# Patient Record
Sex: Female | Born: 1945
Health system: Southern US, Community
[De-identification: ages and names within clinical notes are randomized; demographics above are authoritative.]

## PROBLEM LIST (undated history)

## (undated) DIAGNOSIS — E119 Type 2 diabetes mellitus without complications: Secondary | ICD-10-CM

## (undated) DIAGNOSIS — N814 Uterovaginal prolapse, unspecified: Secondary | ICD-10-CM

## (undated) DIAGNOSIS — K449 Diaphragmatic hernia without obstruction or gangrene: Secondary | ICD-10-CM

## (undated) DIAGNOSIS — F419 Anxiety disorder, unspecified: Secondary | ICD-10-CM

## (undated) DIAGNOSIS — N816 Rectocele: Secondary | ICD-10-CM

## (undated) DIAGNOSIS — J9801 Acute bronchospasm: Secondary | ICD-10-CM

## (undated) DIAGNOSIS — F32A Depression, unspecified: Secondary | ICD-10-CM

## (undated) DIAGNOSIS — Z9889 Other specified postprocedural states: Secondary | ICD-10-CM

## (undated) DIAGNOSIS — I10 Essential (primary) hypertension: Secondary | ICD-10-CM

## (undated) DIAGNOSIS — K219 Gastro-esophageal reflux disease without esophagitis: Secondary | ICD-10-CM

## (undated) DIAGNOSIS — K589 Irritable bowel syndrome without diarrhea: Secondary | ICD-10-CM

## (undated) DIAGNOSIS — Z8719 Personal history of other diseases of the digestive system: Secondary | ICD-10-CM

## (undated) DIAGNOSIS — R7302 Impaired glucose tolerance (oral): Secondary | ICD-10-CM

## (undated) DIAGNOSIS — K2970 Gastritis, unspecified, without bleeding: Secondary | ICD-10-CM

## (undated) DIAGNOSIS — D649 Anemia, unspecified: Secondary | ICD-10-CM

## (undated) DIAGNOSIS — Z950 Presence of cardiac pacemaker: Secondary | ICD-10-CM

## (undated) DIAGNOSIS — T8859XA Other complications of anesthesia, initial encounter: Secondary | ICD-10-CM

## (undated) DIAGNOSIS — Z8619 Personal history of other infectious and parasitic diseases: Secondary | ICD-10-CM

## (undated) DIAGNOSIS — R739 Hyperglycemia, unspecified: Secondary | ICD-10-CM

## (undated) DIAGNOSIS — I639 Cerebral infarction, unspecified: Secondary | ICD-10-CM

## (undated) DIAGNOSIS — Z8489 Family history of other specified conditions: Secondary | ICD-10-CM

## (undated) DIAGNOSIS — K222 Esophageal obstruction: Secondary | ICD-10-CM

## (undated) DIAGNOSIS — K429 Umbilical hernia without obstruction or gangrene: Secondary | ICD-10-CM

## (undated) DIAGNOSIS — E039 Hypothyroidism, unspecified: Secondary | ICD-10-CM

## (undated) DIAGNOSIS — K5790 Diverticulosis of intestine, part unspecified, without perforation or abscess without bleeding: Secondary | ICD-10-CM

## (undated) DIAGNOSIS — T4145XA Adverse effect of unspecified anesthetic, initial encounter: Secondary | ICD-10-CM

## (undated) DIAGNOSIS — G709 Myoneural disorder, unspecified: Secondary | ICD-10-CM

## (undated) DIAGNOSIS — M069 Rheumatoid arthritis, unspecified: Secondary | ICD-10-CM

## (undated) DIAGNOSIS — L409 Psoriasis, unspecified: Secondary | ICD-10-CM

## (undated) DIAGNOSIS — N8110 Cystocele, unspecified: Secondary | ICD-10-CM

## (undated) DIAGNOSIS — I480 Paroxysmal atrial fibrillation: Secondary | ICD-10-CM

## (undated) DIAGNOSIS — I495 Sick sinus syndrome: Secondary | ICD-10-CM

## (undated) DIAGNOSIS — Z90710 Acquired absence of both cervix and uterus: Secondary | ICD-10-CM

## (undated) DIAGNOSIS — R569 Unspecified convulsions: Secondary | ICD-10-CM

## (undated) DIAGNOSIS — N189 Chronic kidney disease, unspecified: Secondary | ICD-10-CM

## (undated) HISTORY — DX: Paroxysmal atrial fibrillation: I48.0

## (undated) HISTORY — DX: Essential (primary) hypertension: I10

## (undated) HISTORY — DX: Personal history of other infectious and parasitic diseases: Z86.19

## (undated) HISTORY — DX: Type 2 diabetes mellitus without complications: E11.9

## (undated) HISTORY — DX: Psoriasis, unspecified: L40.9

## (undated) HISTORY — DX: Chronic kidney disease, unspecified: N18.9

## (undated) HISTORY — PX: RECTOCELE REPAIR: SHX761

## (undated) HISTORY — DX: Anxiety disorder, unspecified: F41.9

## (undated) HISTORY — DX: Irritable bowel syndrome, unspecified: K58.9

## (undated) HISTORY — DX: Diverticulosis of intestine, part unspecified, without perforation or abscess without bleeding: K57.90

## (undated) HISTORY — DX: Cerebral infarction, unspecified: I63.9

## (undated) HISTORY — DX: Rheumatoid arthritis, unspecified: M06.9

## (undated) HISTORY — DX: Esophageal obstruction: K22.2

## (undated) HISTORY — DX: Other specified postprocedural states: Z98.890

## (undated) HISTORY — PX: BLADDER SUSPENSION: SHX72

## (undated) HISTORY — DX: Unspecified convulsions: R56.9

## (undated) HISTORY — DX: Uterovaginal prolapse, unspecified: N81.4

## (undated) HISTORY — DX: Acquired absence of both cervix and uterus: Z90.710

## (undated) HISTORY — DX: Rectocele: N81.6

## (undated) HISTORY — DX: Diaphragmatic hernia without obstruction or gangrene: K44.9

## (undated) HISTORY — DX: Impaired glucose tolerance (oral): R73.02

## (undated) HISTORY — PX: INSERT / REPLACE / REMOVE PACEMAKER: SUR710

## (undated) HISTORY — DX: Personal history of other diseases of the digestive system: Z87.19

## (undated) HISTORY — DX: Umbilical hernia without obstruction or gangrene: K42.9

## (undated) HISTORY — DX: Gastritis, unspecified, without bleeding: K29.70

## (undated) HISTORY — DX: Cystocele, unspecified: N81.10

## (undated) HISTORY — PX: VAGINAL HYSTERECTOMY: SUR661

## (undated) HISTORY — DX: Hypothyroidism, unspecified: E03.9

## (undated) HISTORY — DX: Hyperglycemia, unspecified: R73.9

## (undated) HISTORY — DX: Depression, unspecified: F32.A

## (undated) HISTORY — DX: Acute bronchospasm: J98.01

---

## 1996-02-21 DIAGNOSIS — N8110 Cystocele, unspecified: Secondary | ICD-10-CM

## 1996-02-21 HISTORY — DX: Cystocele, unspecified: N81.10

## 1997-11-12 ENCOUNTER — Ambulatory Visit (HOSPITAL_COMMUNITY): Admission: RE | Admit: 1997-11-12 | Discharge: 1997-11-12 | Payer: Self-pay | Admitting: *Deleted

## 1997-11-12 ENCOUNTER — Encounter: Payer: Self-pay | Admitting: Family Medicine

## 1998-04-07 ENCOUNTER — Other Ambulatory Visit: Admission: RE | Admit: 1998-04-07 | Discharge: 1998-04-07 | Payer: Self-pay | Admitting: Obstetrics and Gynecology

## 1998-12-14 ENCOUNTER — Emergency Department (HOSPITAL_COMMUNITY): Admission: EM | Admit: 1998-12-14 | Discharge: 1998-12-14 | Payer: Self-pay | Admitting: Emergency Medicine

## 1999-04-12 ENCOUNTER — Encounter: Admission: RE | Admit: 1999-04-12 | Discharge: 1999-04-12 | Payer: Self-pay | Admitting: Obstetrics and Gynecology

## 1999-04-12 ENCOUNTER — Encounter: Payer: Self-pay | Admitting: Obstetrics and Gynecology

## 1999-10-17 ENCOUNTER — Encounter: Payer: Self-pay | Admitting: Family Medicine

## 1999-10-17 ENCOUNTER — Ambulatory Visit (HOSPITAL_COMMUNITY): Admission: RE | Admit: 1999-10-17 | Discharge: 1999-10-17 | Payer: Self-pay | Admitting: Family Medicine

## 1999-10-27 ENCOUNTER — Encounter: Payer: Self-pay | Admitting: Family Medicine

## 1999-10-27 ENCOUNTER — Ambulatory Visit (HOSPITAL_COMMUNITY): Admission: RE | Admit: 1999-10-27 | Discharge: 1999-10-27 | Payer: Self-pay | Admitting: Family Medicine

## 1999-11-28 ENCOUNTER — Other Ambulatory Visit: Admission: RE | Admit: 1999-11-28 | Discharge: 1999-11-28 | Payer: Self-pay | Admitting: Gastroenterology

## 1999-11-28 ENCOUNTER — Encounter (INDEPENDENT_AMBULATORY_CARE_PROVIDER_SITE_OTHER): Payer: Self-pay

## 2000-04-19 ENCOUNTER — Ambulatory Visit (HOSPITAL_COMMUNITY): Admission: RE | Admit: 2000-04-19 | Discharge: 2000-04-19 | Payer: Self-pay | Admitting: Obstetrics and Gynecology

## 2000-04-19 ENCOUNTER — Encounter: Payer: Self-pay | Admitting: Obstetrics and Gynecology

## 2001-04-26 ENCOUNTER — Other Ambulatory Visit: Admission: RE | Admit: 2001-04-26 | Discharge: 2001-04-26 | Payer: Self-pay | Admitting: Obstetrics and Gynecology

## 2002-06-26 ENCOUNTER — Ambulatory Visit (HOSPITAL_COMMUNITY): Admission: RE | Admit: 2002-06-26 | Discharge: 2002-06-26 | Payer: Self-pay | Admitting: Family Medicine

## 2002-06-26 ENCOUNTER — Encounter: Payer: Self-pay | Admitting: Family Medicine

## 2002-08-04 ENCOUNTER — Other Ambulatory Visit: Admission: RE | Admit: 2002-08-04 | Discharge: 2002-08-06 | Payer: Self-pay | Admitting: Obstetrics and Gynecology

## 2004-03-01 ENCOUNTER — Ambulatory Visit: Payer: Self-pay | Admitting: Cardiology

## 2004-04-29 ENCOUNTER — Ambulatory Visit: Payer: Self-pay | Admitting: Cardiology

## 2004-06-07 ENCOUNTER — Other Ambulatory Visit: Admission: RE | Admit: 2004-06-07 | Discharge: 2004-06-07 | Payer: Self-pay | Admitting: Obstetrics and Gynecology

## 2005-03-29 ENCOUNTER — Ambulatory Visit: Payer: Self-pay | Admitting: Cardiology

## 2005-11-29 ENCOUNTER — Ambulatory Visit: Payer: Self-pay | Admitting: Cardiology

## 2005-12-07 ENCOUNTER — Ambulatory Visit: Payer: Self-pay

## 2005-12-21 ENCOUNTER — Ambulatory Visit: Payer: Self-pay | Admitting: Cardiology

## 2006-02-20 HISTORY — PX: TRANSTHORACIC ECHOCARDIOGRAM: SHX275

## 2006-02-28 ENCOUNTER — Ambulatory Visit: Payer: Self-pay | Admitting: Cardiology

## 2006-03-13 ENCOUNTER — Ambulatory Visit: Payer: Self-pay | Admitting: Cardiology

## 2006-04-04 ENCOUNTER — Ambulatory Visit: Payer: Self-pay | Admitting: Cardiology

## 2006-04-12 ENCOUNTER — Ambulatory Visit: Payer: Self-pay | Admitting: Internal Medicine

## 2006-06-05 ENCOUNTER — Ambulatory Visit: Payer: Self-pay | Admitting: Internal Medicine

## 2006-08-08 ENCOUNTER — Ambulatory Visit: Payer: Self-pay | Admitting: Cardiology

## 2006-08-14 ENCOUNTER — Ambulatory Visit: Payer: Self-pay

## 2006-08-14 ENCOUNTER — Encounter: Payer: Self-pay | Admitting: Cardiology

## 2006-09-20 ENCOUNTER — Ambulatory Visit: Payer: Self-pay | Admitting: Internal Medicine

## 2006-11-12 ENCOUNTER — Ambulatory Visit: Payer: Self-pay | Admitting: Internal Medicine

## 2007-05-08 ENCOUNTER — Ambulatory Visit: Payer: Self-pay | Admitting: Internal Medicine

## 2007-05-23 ENCOUNTER — Ambulatory Visit: Payer: Self-pay | Admitting: Gastroenterology

## 2007-05-23 LAB — CONVERTED CEMR LAB
ALT: 27 units/L (ref 0–35)
AST: 31 units/L (ref 0–37)
Albumin: 3.7 g/dL (ref 3.5–5.2)
Alkaline Phosphatase: 45 units/L (ref 39–117)
BUN: 8 mg/dL (ref 6–23)
Basophils Absolute: 0.1 10*3/uL (ref 0.0–0.1)
Basophils Relative: 0.9 % (ref 0.0–1.0)
Bilirubin, Direct: 0.2 mg/dL (ref 0.0–0.3)
CO2: 33 meq/L — ABNORMAL HIGH (ref 19–32)
Calcium: 9.3 mg/dL (ref 8.4–10.5)
Chloride: 104 meq/L (ref 96–112)
Creatinine, Ser: 0.6 mg/dL (ref 0.4–1.2)
Eosinophils Absolute: 0.3 10*3/uL (ref 0.0–0.7)
Eosinophils Relative: 4.2 % (ref 0.0–5.0)
Ferritin: 56 ng/mL (ref 10.0–291.0)
Folate: 15.3 ng/mL
GFR calc Af Amer: 132 mL/min
GFR calc non Af Amer: 109 mL/min
Glucose, Bld: 103 mg/dL — ABNORMAL HIGH (ref 70–99)
HCT: 39.1 % (ref 36.0–46.0)
Hemoglobin: 12.8 g/dL (ref 12.0–15.0)
Iron: 67 ug/dL (ref 42–145)
Lymphocytes Relative: 34.5 % (ref 12.0–46.0)
MCHC: 32.6 g/dL (ref 30.0–36.0)
MCV: 93.1 fL (ref 78.0–100.0)
Monocytes Absolute: 0.8 10*3/uL (ref 0.1–1.0)
Monocytes Relative: 9.9 % (ref 3.0–12.0)
Neutro Abs: 3.8 10*3/uL (ref 1.4–7.7)
Neutrophils Relative %: 50.5 % (ref 43.0–77.0)
Platelets: 345 10*3/uL (ref 150–400)
Potassium: 3.9 meq/L (ref 3.5–5.1)
RBC: 4.2 M/uL (ref 3.87–5.11)
RDW: 12.9 % (ref 11.5–14.6)
Saturation Ratios: 17.4 % — ABNORMAL LOW (ref 20.0–50.0)
Sed Rate: 14 mm/hr (ref 0–22)
Sodium: 143 meq/L (ref 135–145)
TSH: 2.55 microintl units/mL (ref 0.35–5.50)
Tissue Transglutaminase Ab, IgA: 0.4 units (ref <7)
Total Bilirubin: 0.6 mg/dL (ref 0.3–1.2)
Total Protein: 6.7 g/dL (ref 6.0–8.3)
Transferrin: 275.6 mg/dL (ref >=212.0)
Vitamin B-12: 999 pg/mL — ABNORMAL HIGH (ref 211–911)
WBC: 7.7 10*3/uL (ref 4.5–10.5)

## 2007-06-05 ENCOUNTER — Ambulatory Visit: Payer: Self-pay | Admitting: Gastroenterology

## 2007-06-05 ENCOUNTER — Encounter: Payer: Self-pay | Admitting: Gastroenterology

## 2007-06-21 ENCOUNTER — Telehealth: Payer: Self-pay | Admitting: Gastroenterology

## 2007-12-20 ENCOUNTER — Ambulatory Visit: Payer: Self-pay | Admitting: Internal Medicine

## 2008-02-04 ENCOUNTER — Encounter: Payer: Self-pay | Admitting: Gastroenterology

## 2008-02-28 ENCOUNTER — Encounter: Payer: Self-pay | Admitting: Gastroenterology

## 2008-04-13 ENCOUNTER — Telehealth: Payer: Self-pay | Admitting: Gastroenterology

## 2008-04-30 ENCOUNTER — Encounter: Payer: Self-pay | Admitting: Gastroenterology

## 2008-04-30 LAB — CONVERTED CEMR LAB
Cholesterol: 123 mg/dL
HDL: 42 mg/dL
LDL Cholesterol: 51 mg/dL
Triglyceride fasting, serum: 152 mg/dL

## 2008-06-09 DIAGNOSIS — I10 Essential (primary) hypertension: Secondary | ICD-10-CM | POA: Insufficient documentation

## 2008-06-09 DIAGNOSIS — I4891 Unspecified atrial fibrillation: Secondary | ICD-10-CM | POA: Insufficient documentation

## 2008-06-09 DIAGNOSIS — Z8719 Personal history of other diseases of the digestive system: Secondary | ICD-10-CM | POA: Insufficient documentation

## 2008-06-09 DIAGNOSIS — K589 Irritable bowel syndrome without diarrhea: Secondary | ICD-10-CM | POA: Insufficient documentation

## 2008-06-09 DIAGNOSIS — R609 Edema, unspecified: Secondary | ICD-10-CM | POA: Insufficient documentation

## 2008-06-09 DIAGNOSIS — I4949 Other premature depolarization: Secondary | ICD-10-CM | POA: Insufficient documentation

## 2008-06-09 DIAGNOSIS — L408 Other psoriasis: Secondary | ICD-10-CM | POA: Insufficient documentation

## 2008-06-09 DIAGNOSIS — E039 Hypothyroidism, unspecified: Secondary | ICD-10-CM | POA: Insufficient documentation

## 2008-06-10 ENCOUNTER — Ambulatory Visit: Payer: Self-pay | Admitting: Internal Medicine

## 2008-06-10 ENCOUNTER — Encounter: Payer: Self-pay | Admitting: Internal Medicine

## 2008-06-16 ENCOUNTER — Encounter: Payer: Self-pay | Admitting: Gastroenterology

## 2008-07-09 ENCOUNTER — Ambulatory Visit: Payer: Self-pay

## 2008-07-09 ENCOUNTER — Ambulatory Visit: Payer: Self-pay | Admitting: Internal Medicine

## 2008-07-15 ENCOUNTER — Telehealth: Payer: Self-pay | Admitting: Internal Medicine

## 2008-07-19 ENCOUNTER — Telehealth: Payer: Self-pay | Admitting: Nurse Practitioner

## 2008-07-21 ENCOUNTER — Telehealth: Payer: Self-pay | Admitting: Internal Medicine

## 2008-07-27 ENCOUNTER — Emergency Department (HOSPITAL_COMMUNITY): Admission: EM | Admit: 2008-07-27 | Discharge: 2008-07-27 | Payer: Self-pay | Admitting: Emergency Medicine

## 2008-08-10 ENCOUNTER — Telehealth: Payer: Self-pay | Admitting: Internal Medicine

## 2008-08-13 ENCOUNTER — Ambulatory Visit: Payer: Self-pay | Admitting: Internal Medicine

## 2008-08-13 DIAGNOSIS — I498 Other specified cardiac arrhythmias: Secondary | ICD-10-CM | POA: Insufficient documentation

## 2008-08-18 ENCOUNTER — Telehealth: Payer: Self-pay | Admitting: Internal Medicine

## 2008-08-19 ENCOUNTER — Ambulatory Visit: Payer: Self-pay | Admitting: Internal Medicine

## 2008-08-19 ENCOUNTER — Encounter: Payer: Self-pay | Admitting: Gastroenterology

## 2008-08-19 ENCOUNTER — Ambulatory Visit (HOSPITAL_COMMUNITY): Admission: AD | Admit: 2008-08-19 | Discharge: 2008-08-20 | Payer: Self-pay | Admitting: Internal Medicine

## 2008-08-20 ENCOUNTER — Encounter: Payer: Self-pay | Admitting: Internal Medicine

## 2008-08-26 ENCOUNTER — Telehealth (INDEPENDENT_AMBULATORY_CARE_PROVIDER_SITE_OTHER): Payer: Self-pay | Admitting: *Deleted

## 2008-08-31 ENCOUNTER — Telehealth: Payer: Self-pay | Admitting: Internal Medicine

## 2008-09-01 ENCOUNTER — Ambulatory Visit: Payer: Self-pay | Admitting: Internal Medicine

## 2008-09-01 DIAGNOSIS — Z950 Presence of cardiac pacemaker: Secondary | ICD-10-CM | POA: Insufficient documentation

## 2008-09-02 ENCOUNTER — Ambulatory Visit: Payer: Self-pay | Admitting: Internal Medicine

## 2008-09-14 ENCOUNTER — Telehealth: Payer: Self-pay | Admitting: Internal Medicine

## 2008-09-15 ENCOUNTER — Encounter: Payer: Self-pay | Admitting: Internal Medicine

## 2008-09-15 ENCOUNTER — Ambulatory Visit: Payer: Self-pay

## 2008-11-24 ENCOUNTER — Ambulatory Visit: Payer: Self-pay | Admitting: Internal Medicine

## 2008-11-26 ENCOUNTER — Telehealth (INDEPENDENT_AMBULATORY_CARE_PROVIDER_SITE_OTHER): Payer: Self-pay | Admitting: *Deleted

## 2008-12-23 ENCOUNTER — Encounter: Payer: Self-pay | Admitting: Cardiology

## 2008-12-28 ENCOUNTER — Telehealth: Payer: Self-pay | Admitting: Internal Medicine

## 2009-01-07 ENCOUNTER — Encounter: Payer: Self-pay | Admitting: Gastroenterology

## 2009-02-25 ENCOUNTER — Telehealth: Payer: Self-pay | Admitting: Gastroenterology

## 2009-03-09 ENCOUNTER — Ambulatory Visit: Payer: Self-pay | Admitting: Gastroenterology

## 2009-03-09 DIAGNOSIS — R7402 Elevation of levels of lactic acid dehydrogenase (LDH): Secondary | ICD-10-CM | POA: Insufficient documentation

## 2009-03-09 DIAGNOSIS — R7401 Elevation of levels of liver transaminase levels: Secondary | ICD-10-CM | POA: Insufficient documentation

## 2009-03-09 DIAGNOSIS — R740 Nonspecific elevation of levels of transaminase and lactic acid dehydrogenase [LDH]: Secondary | ICD-10-CM

## 2009-03-09 LAB — CONVERTED CEMR LAB
ALT: 37 units/L — ABNORMAL HIGH (ref 0–35)
AST: 52 units/L — ABNORMAL HIGH (ref 0–37)
Albumin: 3.5 g/dL (ref 3.5–5.2)
Alkaline Phosphatase: 44 units/L (ref 39–117)
Bilirubin, Direct: 0.1 mg/dL (ref 0.0–0.3)
HCV Ab: NEGATIVE
Hepatitis B Surface Ag: NEGATIVE
Total Bilirubin: 0.8 mg/dL (ref 0.3–1.2)
Total Protein: 6.3 g/dL (ref 6.0–8.3)

## 2009-03-11 ENCOUNTER — Telehealth: Payer: Self-pay | Admitting: Internal Medicine

## 2009-03-12 ENCOUNTER — Ambulatory Visit (HOSPITAL_COMMUNITY): Admission: RE | Admit: 2009-03-12 | Discharge: 2009-03-12 | Payer: Self-pay | Admitting: Gastroenterology

## 2009-03-12 ENCOUNTER — Ambulatory Visit: Payer: Self-pay | Admitting: Internal Medicine

## 2009-03-19 ENCOUNTER — Telehealth: Payer: Self-pay | Admitting: Internal Medicine

## 2009-03-22 ENCOUNTER — Telehealth: Payer: Self-pay | Admitting: Gastroenterology

## 2009-03-23 ENCOUNTER — Telehealth: Payer: Self-pay | Admitting: Internal Medicine

## 2009-04-02 ENCOUNTER — Telehealth (INDEPENDENT_AMBULATORY_CARE_PROVIDER_SITE_OTHER): Payer: Self-pay | Admitting: *Deleted

## 2009-04-13 ENCOUNTER — Ambulatory Visit: Payer: Self-pay | Admitting: Gastroenterology

## 2009-04-20 ENCOUNTER — Telehealth: Payer: Self-pay | Admitting: Internal Medicine

## 2009-04-26 ENCOUNTER — Telehealth: Payer: Self-pay | Admitting: Gastroenterology

## 2009-05-06 ENCOUNTER — Ambulatory Visit: Payer: Self-pay | Admitting: Internal Medicine

## 2009-08-17 ENCOUNTER — Telehealth: Payer: Self-pay | Admitting: Gastroenterology

## 2009-09-03 ENCOUNTER — Telehealth: Payer: Self-pay | Admitting: Gastroenterology

## 2009-09-29 ENCOUNTER — Ambulatory Visit (HOSPITAL_COMMUNITY): Admission: RE | Admit: 2009-09-29 | Discharge: 2009-09-29 | Payer: Self-pay | Admitting: Gastroenterology

## 2009-09-29 ENCOUNTER — Encounter: Payer: Self-pay | Admitting: Gastroenterology

## 2009-10-01 ENCOUNTER — Telehealth: Payer: Self-pay | Admitting: Gastroenterology

## 2009-10-05 ENCOUNTER — Encounter: Payer: Self-pay | Admitting: Gastroenterology

## 2009-10-06 ENCOUNTER — Telehealth: Payer: Self-pay | Admitting: Gastroenterology

## 2009-10-29 DIAGNOSIS — K7689 Other specified diseases of liver: Secondary | ICD-10-CM | POA: Insufficient documentation

## 2009-11-04 ENCOUNTER — Ambulatory Visit: Payer: Self-pay | Admitting: Gastroenterology

## 2009-11-08 ENCOUNTER — Telehealth: Payer: Self-pay | Admitting: Internal Medicine

## 2009-11-22 ENCOUNTER — Telehealth: Payer: Self-pay | Admitting: Internal Medicine

## 2009-12-20 ENCOUNTER — Encounter: Payer: Self-pay | Admitting: Internal Medicine

## 2009-12-22 ENCOUNTER — Ambulatory Visit: Payer: Self-pay | Admitting: Internal Medicine

## 2009-12-22 DIAGNOSIS — R0609 Other forms of dyspnea: Secondary | ICD-10-CM | POA: Insufficient documentation

## 2009-12-22 DIAGNOSIS — R5383 Other fatigue: Secondary | ICD-10-CM | POA: Insufficient documentation

## 2009-12-22 DIAGNOSIS — R0989 Other specified symptoms and signs involving the circulatory and respiratory systems: Secondary | ICD-10-CM | POA: Insufficient documentation

## 2009-12-24 ENCOUNTER — Telehealth: Payer: Self-pay | Admitting: Internal Medicine

## 2010-02-01 ENCOUNTER — Encounter: Payer: Self-pay | Admitting: Internal Medicine

## 2010-02-02 ENCOUNTER — Encounter: Payer: Self-pay | Admitting: Internal Medicine

## 2010-02-04 ENCOUNTER — Ambulatory Visit (HOSPITAL_BASED_OUTPATIENT_CLINIC_OR_DEPARTMENT_OTHER)
Admission: RE | Admit: 2010-02-04 | Discharge: 2010-02-04 | Payer: Self-pay | Source: Home / Self Care | Attending: Internal Medicine | Admitting: Internal Medicine

## 2010-02-04 ENCOUNTER — Encounter: Payer: Self-pay | Admitting: Pulmonary Disease

## 2010-03-24 ENCOUNTER — Encounter (INDEPENDENT_AMBULATORY_CARE_PROVIDER_SITE_OTHER): Payer: BC Managed Care – PPO

## 2010-03-24 ENCOUNTER — Ambulatory Visit: Admit: 2010-03-24 | Payer: Self-pay | Admitting: Internal Medicine

## 2010-03-24 DIAGNOSIS — I428 Other cardiomyopathies: Secondary | ICD-10-CM

## 2010-03-24 NOTE — Cardiovascular Report (Signed)
Summary: Office Visit  Office Visit   Imported By: Roderic Ovens 10/22/2008 15:22:10  _____________________________________________________________________  External Attachment:    Type:   Image     Comment:   External Document

## 2010-03-24 NOTE — Progress Notes (Signed)
Summary: Question about liver biopsy  Phone Note Call from Patient Call back at Home Phone [phone] Call back at [phone]   Call For: Dr Jarold Motto Summary of Call: Question about her liver biopsy Initial call taken by: Leanor Kail [facility],  August 17, 2009 11:41 AM  Follow-up for Phone Call        Pt is ready to sch liver biopsy.  Is OV needed prior to sch proc.   See OV note from 04/13/09. Follow-up by: Ashok Cordia RN,  August 18, 2009 9:21 AM  Additional Follow-up for Phone Call Additional follow up Details #1::        OK  TO GO AHEAD....DRP Additional Follow-up by: Mardella Layman MD FACG,  August 18, 2009 9:42 AM    Additional Follow-up for Phone Call Additional follow up Details #2::    Records faxed to [facility].  They will review recs and call with appt. Follow-up by: Ashok Cordia RN,  August 18, 2009 10:40 AM

## 2010-03-24 NOTE — Progress Notes (Signed)
Summary: re bloodwork  Phone Note Call from Patient   Caller: Patient Reason for Call: Talk to Nurse Summary of Call: pt calling to see if bloodwork needs to be done at her next visit [phone] Initial call taken by: Glynda Jaeger,  November 22, 2009 3:03 PM  Follow-up for Phone Call        lmom for pt that I was returning her call Dennis Bast, RN, BSN  November 22, 2009 4:18 PM

## 2010-03-24 NOTE — Letter (Signed)
Summary: Designer, fashion/clothing, Main Office  [address]   [place], [place] [address]   Phone: [phone]  Fax: [phone]    May 06, 2009  Chief [address]  [address]  [place], [place] [address]  [id number]  Juror: #______   Dear Milford Cage:   Amado Nash of [name] has just informed me that he has been chosen to serve on the jury beginning the 3rd Monday of March and goes through May,   She is a patient under my care.  She has a heart condition and has a pacemaker. I do not feel that she should serve on the jury. I would like to request that she be excused from jury duty permanently.  Your consideration of this matter is greatly appreciated.  Respectfully,   Dr. Sharlot Gowda [facility]

## 2010-03-24 NOTE — Progress Notes (Signed)
Summary: Triage  Phone Note Call from Patient Call back at [phone]   Caller: Patient Call For: Dr. Jarold Motto Reason for Call: Talk to Nurse Summary of Call: Pt. is calling about having a liver biopsy done which was suggested by her Dermatologist Initial call taken by: Karna Christmas,  February 25, 2009 11:12 AM  Follow-up for Phone Call        See letter from [facility]. Follow-up by: Ashok Cordia RN,  February 25, 2009 2:43 PM  Additional Follow-up for Phone Call Additional follow up Details #1::        would need office consult Additional Follow-up by: Mardella Layman MD [facility],  February 26, 2009 8:30 AM    Additional Follow-up for Phone Call Additional follow up Details #2::    Pt. will see Dr.Recardo Linn on 03-09-09 at 10:45am. Pt. instructed to call back as needed.  Follow-up by: Laureen Ochs LPN,  February 26, 2009 11:09 AM

## 2010-03-24 NOTE — Letter (Signed)
Summary: Inserting a Heart Pacemaker coverage  Inserting a Heart Pacemaker coverage   Imported By: Kassie Mends 09/30/2008 10:37:13  _____________________________________________________________________  External Attachment:    Type:   Image     Comment:   External Document

## 2010-03-24 NOTE — Progress Notes (Signed)
Summary: pt had episode of a-fib and syncope  Phone Note Call from Patient Call back at [phone]   Caller: Patient Reason for Call: Talk to Nurse, Talk to Doctor Summary of Call: over the weekend pt had an e[pisode of a- fib and syncope and she is concerned because the medication she is on was suppose to help tht and she wants to talk to someone about it Initial call taken by: Omer Jack,  July 21, 2008 9:31 AM  Follow-up for Phone Call        Return phone call to patient.  See on call note.  She did speek with Ward Givens, NP last pm.  This episode lasted for a total of 10 hours.  She is concerned b/c she never had the feeling of passing out until she started the Flecainide.  Will discuss w Dr Ladona Ridgel and call her back. Dennis Bast, RN, BSN  July 21, 2008 9:54 AM  Additional Follow-up for Phone Call Additional follow up Details #1::        PATIENT CALLED TO FOLLOW UP OF PREVIOUS MESSAGE.  WOULD LIKE A CALL TODAY IF POSSIBLE.  PT IS CALLING BACK TODAY B/C NO ONE HAS CALLED HER FROM HER FIRST CALL ON THE 1ST. PLEASE CALL ASAP. PT STILL HAVING SAME PROBLEMS Jamie Little  July 27, 2008 12:44 PM Additional Follow-up by: Burnard Leigh,  July 23, 2008 11:54 AM    Additional Follow-up for Phone Call Additional follow up Details #2::    Left message to call back on ID voice mail Meredith Staggers, RN  July 27, 2008 12:53 PM   almost passed out 7 or 8 times last weekend, had another spell last night, right now feels like is back in a-fib feels dizzy and lightheaded, no CP, pulse is very irregular, took metoprolol 25mg  this am along w/flecainide, will discuss w/NP and call back Meredith Staggers, RN  July 27, 2008 1:01 PM   Discussed w/Chris Brion Aliment, NP recommend pt go to ER to be admitted, pt aware and agreeable, husband will drive her Meredith Staggers, RN  July 27, 2008 1:29 PM

## 2010-03-24 NOTE — Progress Notes (Signed)
Summary: pt has questions regarding medication/GT  Phone Note Call from Patient Call back at [phone]   Caller: Patient Reason for Call: Talk to Nurse, Talk to Doctor Summary of Call: PT has questions regading a medication another MD wants to put her on and she wants to discuss this first due to the fact it is a injection. Initial call taken by: Omer Jack,  November 08, 2009 4:51 PM  Follow-up for Phone Call        lmom for pt to call me back Dennis Bast, RN, BSN  November 08, 2009 5:34 PM wants to put her on Stelara injections one per month.  Want her to start soon.  What are your thoughts?   Spoke with Dr Ladona Ridgel he doesn't know enough about the medication to make a judgement call.  Patient aware Dennis Bast, RN, BSN  November 15, 2009 2:22 PM

## 2010-03-24 NOTE — Assessment & Plan Note (Signed)
Summary: 1:30/rov/follow up monitor results/kfw   CC:  sob and tired.  History of Present Illness: [name] returns today for followup of her atrial fibrillation and dizzy spells.  She has a h/o PAF which has increased in frequency and severity over the past year such that I placed her on flecainide 100 mg twice daily.  She now has developed lightheadedness and a cardionet monitor has been obtained which reveals periods of asystole for up to 6 seconds.  She notes the sensation that she is about to pass out but does not.  She has not had any falls.  She denies c/p or sob.  Current Medications (verified): 1)  Levoxyl 50 Mcg Tabs (Levothyroxine Sodium) .Marland Kitchen.. 1 By Mouth Once Daily 2)  Paxil 10 Mg Tabs (Paroxetine Hcl) .Marland Kitchen.. 1 By Mouth Once Daily 3)  Metoprolol Succinate 25 Mg Xr24h-Tab (Metoprolol Succinate) .... Take One Tablet By Mouth Daily 4)  Protonix 40 Mg Tbec (Pantoprazole Sodium) .Marland Kitchen.. 1 By Mouth Once Daily 5)  Aspirin 81 Mg Tbec (Aspirin) .... Take One Tablet Two Times A Day 6)  Lorazepam 0.5 Mg Tabs (Lorazepam) .... Take At Bedtime As Needed 7)  Crestor 10 Mg Tabs (Rosuvastatin Calcium) .... Take 1 By Mouth Once Daily 8)  Methotrexate 2.5 Mg Tabs (Methotrexate Sodium) .... Take 4 By Mouth Every Week 9)  Torsemide 10 Mg Tabs (Torsemide) .... Take 1 By Mouth Once Daily 10)  Biestrogen 3.75 Mg .... Once Daily 11)  Fish Oil  Oil (Fish Oil) .... Take 2 Once Daily 12)  Flecainide Acetate 100 Mg Tabs (Flecainide Acetate) .... Take One Tablet By Mouth Every 12 Hours  Allergies (verified): 1)  ! Penicillin 2)  ! Codeine 3)  ! Doxycycline  Past History:  Past Medical History: Last updated: 06/10/2008 EDEMA (ICD-782.3) PREMATURE VENTRICULAR CONTRACTIONS (ICD-427.69) PSORIASIS (ICD-696.1) DIVERTICULOSIS, COLON, HX OF (ICD-V12.79) IRRITABLE BOWEL SYNDROME (ICD-564.1) ATRIAL FIBRILLATION (ICD-427.31) HYPOTHYROIDISM (ICD-244.9) HYPERTENSION (ICD-401.9)  Review of Systems  The patient  denies chest pain, syncope, dyspnea on exertion, and peripheral edema.    Vital Signs:  Patient profile:   65 year old female Height:      64 inches Weight:      178 pounds BMI:     30.66 Pulse rate:   60 / minute Pulse rhythm:   regular BP sitting:   134 / 74 Cuff size:   large  Vitals Entered By: Flonnie Overman (August 13, 2008 1:53 PM)  Physical Exam  General:  Well developed, well nourished, in no acute distress. Head:  normocephalic and atraumatic Eyes:  PERRLA/EOM intact; conjunctiva and lids normal. Mouth:  Teeth, gums and palate normal. Oral mucosa normal. Neck:  Neck supple, no JVD. No masses, thyromegaly or abnormal cervical nodes. Lungs:  clear bilaterally with no wheezes, rales , or rhonchi. Heart:  RRR with normal S1 and S2.  PMI is not enlarged or laterally displaced. Abdomen:  Bowel sounds positive; abdomen soft and non-tender without masses, organomegaly, or hernias noted. No hepatosplenomegaly. Msk:  Back normal, normal gait. Muscle strength and tone normal. Pulses:  pulses normal in all 4 extremities Extremities:  No clubbing or cyanosis. Neurologic:  Alert and oriented x 3.   Event Monitor  Procedure date:  08/13/2008  Findings:      NSR with paroxysms of atrial fibrillation and pauses (post term) of up to 6 seconds.  Impression & Recommendations:  Problem # 1:  ATRIAL FIBRILLATION (ICD-427.31) I have recommended that we continue her flecainide and low dose  beta blocker for now.  I have discussed atrial fibrillation ablation as she continues to have symptomatic atrial fibrillation but for now she prefers to consider her options. Her updated medication list for this problem includes:    Metoprolol Succinate 25 Mg Xr24h-tab (Metoprolol succinate) .Marland Kitchen... Take one tablet by mouth daily    Aspirin 81 Mg Tbec (Aspirin) .Marland Kitchen... Take one tablet two times a day    Flecainide Acetate 100 Mg Tabs (Flecainide acetate) .Marland Kitchen... Take one tablet by mouth every 12  hours  Problem # 2:  BRADYCARDIA (ICD-427.89) At this point, I have recommended proceeding with PPM.  Until her PPM has been placed, I have asked that she not drive.  She is considering her options and will call once she has decided her course of therapy. Her updated medication list for this problem includes:    Metoprolol Succinate 25 Mg Xr24h-tab (Metoprolol succinate) .Marland Kitchen... Take one tablet by mouth daily    Aspirin 81 Mg Tbec (Aspirin) .Marland Kitchen... Take one tablet two times a day    Flecainide Acetate 100 Mg Tabs (Flecainide acetate) .Marland Kitchen... Take one tablet by mouth every 12 hours  Problem # 3:  HYPERTENSION (ICD-401.9) continue meds as noted below. Her updated medication list for this problem includes:    Metoprolol Succinate 25 Mg Xr24h-tab (Metoprolol succinate) .Marland Kitchen... Take one tablet by mouth daily    Aspirin 81 Mg Tbec (Aspirin) .Marland Kitchen... Take one tablet two times a day    Torsemide 10 Mg Tabs (Torsemide) .Marland Kitchen... Take 1 by mouth once daily

## 2010-03-24 NOTE — Progress Notes (Signed)
Summary: INCISION SITE SORE/SENSITIVE  SHOULDER SORE  Phone Note Call from Patient Call back at [phone]   Caller: Patient Reason for Call: Talk to Nurse Summary of Call: SWELLING NOTED AT INCISION SITE.  AREA IS VERY SORE/SENSITIVE, SHOULDER IS SORE.   Initial call taken by: Burnard Leigh,  September 14, 2008 2:26 PM  Follow-up for Phone Call        talked with patient pt states swelling at pacer site may be some better but she continues to have some soreness there and now she has some L shoulder soreness-she states the site is almost completely healed and there is no drainage from the site she denies fever/chills/night sweats-she has checked her temperature a couple a times and she has not had a fever discussed with Amber-have given pt appt 09/15/08 to have pacer checked Katina Dung, RN, BSN  September 14, 2008 2:41 PM

## 2010-03-24 NOTE — Progress Notes (Signed)
Summary: Biopsy results  Phone Note Call from Patient Call back at [phone]   Caller: Patient Call For: Dr. Jarold Motto Reason for Call: Talk to Nurse Summary of Call: Calling about biopsy results Initial call taken by: Karna Christmas,  October 01, 2009 10:17 AM  Follow-up for Phone Call        Results not available yet.  Pt notified.   Follow-up by: Ashok Cordia RN,  October 01, 2009 10:36 AM

## 2010-03-24 NOTE — Progress Notes (Signed)
Summary: bp meds  Phone Note Call from Patient Call back at Home Phone [phone] Call back at [phone]   Caller: Patient Reason for Call: Talk to Nurse Summary of Call: having problems with bp meds... making her sleepy and congested Initial call taken by: Migdalia Dk,  March 19, 2009 10:38 AM  Follow-up for Phone Call        pt started the Carvedilol om Monday and says she can't function.  It is making her so sleepy.  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  I told her I would ask Dr Ladona Ridgel but that she needed to follow up with Dr Christell Constant about her blood pressure.  She will need an appointment with him soon to discuss these issuses and what will be next. Dennis Bast, RN, BSN  March 19, 2009 12:29 PM Take 1/2 tablet twice daily for one week then up titrate meds as she can.  Will call me back on Monday and let me know how she is feeling Dennis Bast, RN, BSN  March 19, 2009 4:59 PM  Additional Follow-up for Phone Call Additional follow up Details #1::        returning call, please call back @ [phone], Migdalia Dk  March 22, 2009 10:06 AM

## 2010-03-24 NOTE — Procedures (Signed)
Summary: pc2   Allergies: 1)  ! Penicillin 2)  ! Codeine 3)  ! Doxycycline   PPM Follow Up Remote Check?  No Tech Comments:  rate response turned off at patient's request. Altha Harm, LPN  September 02, 2008 2:55 PM  MD Comments:  Agree with above.

## 2010-03-24 NOTE — Assessment & Plan Note (Signed)
Summary: pc2   Visit Type:  Follow-up  CC:  abdominal pain.  History of Present Illness: Ms. [name] returns today for followup.  She is a middle aged woman with a h/o PAF, sinus bradycardia with long pauses, and is s/p PPM.  She has had no more syncopal episodes since her device was implanted.  She still experiences fatigue.  She notes a lack of energy.  She has rare palpitations.  Current Medications (verified): 1)  Levoxyl 50 Mcg Tabs (Levothyroxine Sodium) .Marland Kitchen.. 1 By Mouth Once Daily 2)  Paxil 10 Mg Tabs (Paroxetine Hcl) .Marland Kitchen.. 1 By Mouth Once Daily 3)  Metoprolol Succinate 25 Mg Xr24h-Tab (Metoprolol Succinate) .... Take One Tablet By Twice Daily 4)  Omeprazole 40 Mg Cpdr (Omeprazole) 5)  Aspirin 81 Mg Tbec (Aspirin) .... Take One Tablet Two Times A Day 6)  Lorazepam 0.5 Mg Tabs (Lorazepam) .... Take At Bedtime As Needed 7)  Crestor 10 Mg Tabs (Rosuvastatin Calcium) .... Take 1 By Mouth Once Daily 8)  Methotrexate 2.5 Mg Tabs (Methotrexate Sodium) .... Take 4 By Mouth Every Week 9)  Torsemide 10 Mg Tabs (Torsemide) .... Take 1 By Mouth Once Daily 10)  Biestrogen 3.75 Mg .... Once Daily 11)  Fish Oil  Oil (Fish Oil) .... Take 2 Once Daily 12)  Flecainide Acetate 100 Mg Tabs (Flecainide Acetate) .... Take One Tablet By Mouth Every 12 Hours  Allergies: 1)  ! Penicillin 2)  ! Codeine 3)  ! Doxycycline  Past History:  Past Medical History: Last updated: 06/10/2008 EDEMA (ICD-782.3) PREMATURE VENTRICULAR CONTRACTIONS (ICD-427.69) PSORIASIS (ICD-696.1) DIVERTICULOSIS, COLON, HX OF (ICD-V12.79) IRRITABLE BOWEL SYNDROME (ICD-564.1) ATRIAL FIBRILLATION (ICD-427.31) HYPOTHYROIDISM (ICD-244.9) HYPERTENSION (ICD-401.9)  Review of Systems  The patient denies chest pain, syncope, dyspnea on exertion, and peripheral edema.    Vital Signs:  Patient profile:   65 year old female Height:      64 inches Weight:      182 pounds Pulse rate:   52 / minute BP sitting:   144 / 94  (left  arm)  Vitals Entered By: Jacquelin Hawking, CMA (November 24, 2008 3:58 PM)  Physical Exam  General:  Well developed, well nourished, in no acute distress.  HEENT: normal Neck: supple. No JVD. Carotids 2+ bilaterally no bruits Cor: RRR no rubs, gallops or murmur Lungs: CTA Ab: soft, nontender. nondistended. No HSM. Good bowel sounds Ext: warm. no cyanosis, clubbing or edema Neuro: alert and oriented. Grossly nonfocal. affect pleasant    PPM Specifications Following MD:  Lewayne Bunting, MD     PPM Vendor:  [employer]     PPM Model Number:  [id number]     PPM Serial Number:  [id number] PPM DOI:  08/19/2008     PPM Implanting MD:  Lewayne Bunting, MD  Lead 1    Location: RA     DOI: 08/19/2008     Model #: [id number]     Serial #: [id number]     Status: active Lead 2    Location: RV     DOI: 08/19/2008     Model #: [id number]     Serial #: [id number]     Status: active  Magnet Response Rate:  BOL 100 ERI 85    PPM Follow Up Remote Check?  No Battery Voltage:  3.01 V     Battery Est. Longevity:  11.9 years     Pacer Dependent:  No       PPM Device Measurements Atrium  Amplitude: 4.8  mV, Impedance: 490 ohms, Threshold: 1.375 V at 0.4 msec Right Ventricle  Amplitude: 11.8 mV, Impedance: 600 ohms, Threshold: 1.375 V at 0.4 msec  Episodes MS Episodes:  1     Percent Mode Switch:  <1%     Coumadin:  No Ventricular High Rate:  0     Atrial Pacing:  15%     Ventricular Pacing:  <1%  Parameters Mode:  DDD     Lower Rate Limit:  50     Upper Rate Limit:  100 Paced AV Delay:  200     Sensed AV Delay:  150 Next Cardiology Appt Due:  07/21/2009 Tech Comments:  Auto capture turned on in the [employer].  She will see Dr. Ladona Ridgel back in June 2011 Altha Harm, LPN  November 24, 2008 4:11 PM  MD Comments:  Agree with above.  Impression & Recommendations:  Problem # 1:  CARDIAC PACEMAKER IN SITU (ICD-V45.01) Her device is working normally.  Will recheck in several months.  Problem # 2:  ATRIAL FIBRILLATION  (ICD-427.31) Her atrial fibrillation has been well controlled. I have asked her to reduce her toprol as she c/o fatigue which may be related to her beta blocker. Her updated medication list for this problem includes:    Metoprolol Succinate 25 Mg Xr24h-tab (Metoprolol succinate) .Marland Kitchen... Take one tablet by twice daily    Aspirin 81 Mg Tbec (Aspirin) .Marland Kitchen... Take one tablet two times a day    Flecainide Acetate 100 Mg Tabs (Flecainide acetate) .Marland Kitchen... Take one tablet by mouth every 12 hours  Problem # 3:  HYPERTENSION (ICD-401.9) Her blood pressure has been well controlled.  Continue meds as noted below except that metoprolol has been reduced to once daily. Her updated medication list for this problem includes:    Metoprolol Succinate 25 Mg Xr24h-tab (Metoprolol succinate) .Marland Kitchen... Take one tablet by twice daily    Aspirin 81 Mg Tbec (Aspirin) .Marland Kitchen... Take one tablet two times a day    Torsemide 10 Mg Tabs (Torsemide) .Marland Kitchen... Take 1 by mouth once daily

## 2010-03-24 NOTE — Progress Notes (Signed)
Summary: diarrhea,gas,bloating, wants to talk to nurse       21  22  23  24  25   Phone Note Call from Patient Call back at cell [phone]   Call For: Jarold Motto Reason for Call: Talk to Nurse Summary of Call: Diarrhea, bloating, extreme gas--getting worse past few days, wants to talk to nurse.Marland KitchenMarland KitchenPatient's chart has been requested. Initial call taken by: Verdell Face,  Jun 21, 2007 9:42 AM  Follow-up for Phone Call        left message on machine to call back. Harlow Mares CMA  Jun 21, 2007 10:50 AM  Past two days constant diarrhea and gas no matter what she eats. Pt having cramping from gas, no abd pain. Bm's waking pt out of sleep, had more gas than stool. Pt says s/s have got worse since taking Kapidex.  Please advise.   Follow-up by: Harlow Mares CMA,  Jun 21, 2007 10:54 AM  Additional Follow-up for Phone Call Additional follow up Details #1::        She has IBS...Rx. as needed immodium q8h and hold Kapidex  .Marland KitchenMarland KitchenDRP Additional Follow-up by: Mardella Layman MD,  Jun 21, 2007 12:53 PM    Additional Follow-up for Phone Call Additional follow up Details #2::    advised to use immodium q 8 hours and stop kapidex, use gasx as needed. Pt asked what to take for reflux advised to take omeprazole daily. Follow-up by: Harlow Mares CMA,  Jun 21, 2007 1:17 PM

## 2010-03-24 NOTE — Assessment & Plan Note (Signed)
Summary: GXT/SL   Exercise Tolerance Test Cardiovascular Risk History:      Positive major cardiovascular risk factors include female age over 63 years old, hyperlipidemia, hypertension, and family history for ischemic heart disease (females < 57 years old & males < 22 years old).  Negative major cardiovascular risk factors include no history of diabetes and non-tobacco-user status.        Further assessment for target organ damage reveals no history of ASHD, cardiac end-organ damage (CHF/LVH), stroke/TIA, peripheral vascular disease, renal insufficiency, or hypertensive retinopathy.    Baseline EKG:    Rhythm:     normal sinus    Rate:       66    PR:       .19    QRS:       .10    QT:       .42    QTc:       .44  Exercise Tolerance Test Results:    Ordering MD:        Dr. Lewayne Bunting    Interpreting MD:     Dr. Lewayne Bunting    Indication for ETT:     flecainde    Contraindication to ETT:   no    Stress Modality:     exercise-treadmill    Cardiac Imaging Performed:   none    Protocol:       Standard Bruce-maximal    Maximum BP:        191 / 76    MPHR (bpm):        160    85% MPHR (bpm):     136    MHR obtained (bpm):        136    Reached 85% MPHR       (min:sec):       7:00    Total Exercise Time       (min:sec):       7:02    Workload in METS:     8.0    Borg Scale:       17    ST Segment analysis:       At Rest:       normal ST segments-no evidence of significant ST depression       With Exercise:     no evidence of significant ST depression       Max. ST segment deviation          (during exercise or rest):   0 mm    Angina during ETT:     absent (0)  Cardiovascular Risk Assessment/Plan:      The patient's hypertensive risk group is category B: At least one risk factor (excluding diabetes) with no target organ damage.  Her calculated 10 year risk of coronary heart disease is 15 %.  Today's blood pressure is 178/83.  Her blood pressure goal is < 140/90.  Exercise  Tolerance Test Assessment:    Quality of ETT:   diagnostic    ETT Interpretation:   normal-no evidence of ischemia by ST analysis    Comments:     No inducible arrhythmias    Recommendations:   Continue flecainide 100 mg twice daily

## 2010-03-24 NOTE — Assessment & Plan Note (Signed)
Summary: CONSULT FOR LIVER BIOPSY PER PT. DERMATOLOGIST     DEBORAH   History of Present Illness Visit Type: Follow-up Visit Primary GI MD: Sheryn Bison MD FACP FAGA Primary Provider: Rudi Heap, MD Chief Complaint: Patient here to discuss having a liver biopsy. She takes a low dosage of methotrexate. History of Present Illness:   This patient is a 65 year old Caucasian female that I have followed for many years because of chronic irritable  bowel syndrome. She has multiple medical problems including recurrent atrial fibrillation with pacemaker insertion. 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 Jonell Cluck in dermatology and Dr. Alden Server in primary care. She is on methotrexate 2.5 mg 2 tablets weekly which is controlled her psoriasis extremely well. She has had regular liver function tests for many years that have been normal but recent transaminases were slightly elevated in November with SGOT at 55, SGOT 40, GGT 31, normal bilirubin and alkaline phosphatase. Patient denies right upper quadrant pain, nausea vomiting, mental status changes, abdominal swelling, coagulopathy, or history of chronic liver disease. She denies a history of previous transfusions or intravenous drug use. There is no history of known hepatitis or pancreatitis, and family history is noncontributory.  She does have chronic thyroid dysfunction and is on Synthroid therapy. For depression she takes Paxil 10 mg a day and lorazepam at bedtime. Additionally she takes Crestor 10 mg a day, metaprolol 25 mg daily and estrogen replacement therapy. She does not smoke or abuse ethanol.  Review of her record was rather extensive and time consuming today. She has had thorough evaluations for IBS with colonoscopy and multiple biopsies negative in November of 2009. Last ultrasound exam was 10 years ago. She has no history of known liver disease or gallbladder problems. She does suffer from chronic obesity.   GI  Review of Systems    Reports acid reflux, bloating, dysphagia with solids, heartburn, and  nausea.      Denies abdominal pain, belching, chest pain, dysphagia with liquids, loss of appetite, vomiting, vomiting blood, weight loss, and  weight gain.      Reports constipation, diverticulosis, and  irritable bowel syndrome.     Denies anal fissure, black tarry stools, change in bowel habit, diarrhea, fecal incontinence, heme positive stool, jaundice, light color stool, liver problems, rectal bleeding, and  rectal pain. Preventive Screening-Counseling & Management  Alcohol-Tobacco     Smoking Status: quit  Caffeine-Diet-Exercise     Does Patient Exercise: no      Drug Use:  no.      Current Medications (verified): 1)  Levoxyl 50 Mcg Tabs (Levothyroxine Sodium) .Marland Kitchen.. 1 By Mouth Once Daily 2)  Paxil 10 Mg Tabs (Paroxetine Hcl) .Marland Kitchen.. 1 By Mouth Once Daily 3)  Metoprolol Succinate 25 Mg Xr24h-Tab (Metoprolol Succinate) .... Take One Tablet Once Daily 4)  Omeprazole 40 Mg Cpdr (Omeprazole) .... Take 1 Tablet By Mouth Once A Day 5)  Aspirin 81 Mg Tbec (Aspirin) .... Take One Tablet Two Times A Day 6)  Lorazepam 0.5 Mg Tabs (Lorazepam) .... Take At Bedtime As Needed 7)  Crestor 10 Mg Tabs (Rosuvastatin Calcium) .... Take 1 By Mouth Once Daily 8)  Methotrexate 2.5 Mg Tabs (Methotrexate Sodium) .... Take 2 By Mouth Every Week 9)  Torsemide 20 Mg Tabs (Torsemide) .... Take 1 Tablet By Mouth Once A Day 10)  Biestrogen 3.75 Mg .... Once Daily 11)  Lovaza 1 Gm Caps (Omega-3-Acid Ethyl Esters) .... Take 4 Capsules By Mouth Once Daily 12)  Flecainide Acetate 100 Mg Tabs (Flecainide Acetate) .... Take One Tablet By Mouth Every 12 Hours  Allergies (verified): 1)  ! Penicillin 2)  ! Codeine 3)  ! Doxycycline  Past History:  Past medical, surgical, family and social histories (including risk factors) reviewed for relevance to current acute and chronic problems.  Past Medical History: Reviewed  history from 06/10/2008 and no changes required. EDEMA (ICD-782.3) PREMATURE VENTRICULAR CONTRACTIONS (ICD-427.69) PSORIASIS (ICD-696.1) DIVERTICULOSIS, COLON, HX OF (ICD-V12.79) IRRITABLE BOWEL SYNDROME (ICD-564.1) ATRIAL FIBRILLATION (ICD-427.31) HYPOTHYROIDISM (ICD-244.9) HYPERTENSION (ICD-401.9)  Past Surgical History: Pacemaker Placement Hysterectomy/Bladder tack/repair of rectocele  Family History: Reviewed history from 06/09/2008 and no changes required.  Positive for coronary disease Family History of Breast Cancer: 1/2 sister No FH of Colon Cancer: Family History of Ovarian Cancer: 1/2 sister Family History of Uterine Cancer: Aunt Family History of Crohn's: Neice Family History of Diabetes: Mother, Maternal Grandmother, Sister Family History of Heart Disease: Mother, Father, Brother x 2   Social History: Reviewed history from 06/09/2008 and no changes required. She is not married. Patient is a former smoker. -smoked as a teenager Alcohol Use - no Daily Caffeine Use-1-2 cups daily Illicit Drug Use - no Patient does not get regular exercise.  Drug Use:  no Does Patient Exercise:  no  Review of Systems       The patient complains of back pain, change in vision, confusion, fatigue, headaches-new, heart rhythm changes, shortness of breath, sleeping problems, and swelling of feet/legs.   General:  Denies fever, chills, sweats, anorexia, fatigue, weakness, malaise, weight loss, and sleep disorder. Eyes:  Denies blurring, diplopia, irritation, discharge, vision loss, scotoma, eye pain, and photophobia. ENT:  Denies earache, ear discharge, tinnitus, decreased hearing, nasal congestion, loss of smell, nosebleeds, sore throat, hoarseness, and difficulty swallowing. CV:  Complains of dyspnea on exertion; denies chest pains, angina, palpitations, syncope, orthopnea, PND, peripheral edema, and claudication. Resp:  Complains of dyspnea with exercise; denies dyspnea at rest,  cough, sputum, wheezing, coughing up blood, and pleurisy. GI:  Denies difficulty swallowing, pain on swallowing, nausea, indigestion/heartburn, vomiting, vomiting blood, abdominal pain, jaundice, gas/bloating, diarrhea, constipation, change in bowel habits, bloody BM's, black BMs, and fecal incontinence. GU:  Denies urinary burning, blood in urine, nocturnal urination, urinary frequency, urinary incontinence, abnormal vaginal bleeding, amenorrhea, menorrhagia, vaginal discharge, pelvic pain, genital sores, painful intercourse, and decreased libido. MS:  Denies joint pain / LOM, joint swelling, joint stiffness, joint deformity, low back pain, muscle weakness, muscle cramps, muscle atrophy, leg pain at night, leg pain with exertion, and shoulder pain / LOM hand / wrist pain (CTS). Derm:  Complains of rash, itching, and dry skin; denies hives, moles, warts, and unhealing ulcers. Neuro:  Denies weakness, paralysis, abnormal sensation, seizures, syncope, tremors, vertigo, transient blindness, frequent falls, frequent headaches, difficulty walking, headache, sciatica, radiculopathy other:, restless legs, memory loss, and confusion. Psych:  Complains of depression and anxiety; denies memory loss, suicidal ideation, hallucinations, paranoia, phobia, and confusion. Heme:  Denies bruising, bleeding, enlarged lymph nodes, and pagophagia.  Vital Signs:  Patient profile:   65 year old female Height:      64 inches Weight:      185.13 pounds BMI:     31.89 BSA:     1.89 Pulse rate:   60 / minute Pulse rhythm:   regular BP sitting:   152 / 92  (left arm)  Vitals Entered By: Hortense Ramal CMA Duncan Dull) (March 09, 2009 10:39 AM)  Physical Exam  General:  Well developed,  well nourished, no acute distress.healthy appearing.   Head:  Normocephalic and atraumatic. Eyes:  PERRLA, no icterus.exam deferred to patient's ophthalmologist.   Neck:  Supple; no masses or thyromegaly. Lungs:  Clear throughout to  auscultation. Heart:  Heart Rate is irregular but pulse is 60. Cannot appreciate murmurs gallops or rubs. Abdomen:  Soft, nontender and nondistended. No masses, hepatosplenomegaly or hernias noted. Normal bowel sounds.obese.   Msk:  Symmetrical with no gross deformities. Normal posture. Pulses:  Normal pulses noted. Extremities:  No clubbing, cyanosis, edema or deformities noted.trace pedal edema.   Neurologic:  Alert and  oriented x4;  grossly normal neurologically. Skin:  There a red macular areas which are nonpustular on extremities and trunk but not in her joint flexures. Cervical Nodes:  No significant cervical adenopathy. Axillary Nodes:  No significant axillary adenopathy. Inguinal Nodes:  No significant inguinal adenopathy. Psych:  Alert and cooperative. Normal mood and affect.   Impression & Recommendations:  Problem # 1:  NONSPEC ELEVATION OF LEVELS OF TRANSAMINASE/LDH (ICD-790.4) Assessment New Recent recommendations do not require routine liver biopsy in patients on methotrexate unless there is evidence of abnormal liver function tests, alcohol abuse, or other suspected chronic liver disease etiologies. Reviewed this patient's record shows normal liver function test until November of this year. On exam there is no evidence of hepatomegaly or chronic liver disease. I suspect she has some fatty infiltration of her liver associated with her obesity, but I cannot exclude hepatic injury from methotrexate. She probably will need percutaneous ultrasound-guided liver biopsy. I have ordered other labs to exclude other causes of chronic liver disease and we'll perform upper abdominal ultrasound exam. I will see her back once these have been completed. As mentioned above there is no history of chronic alcohol abuse. Patient is not anticoagulated but is on daily aspirin therapy. Orders: TLB-Hepatic/Liver Function Pnl ([id number]) T-AMA ([id number]) T-ANA [phone]) T-Hepatitis B Surface  Antigen [phone]) T-Hepatitis C Anti HCV ([id number]) Ultrasound Abdomen (UAS)  Problem # 2:  CARDIAC PACEMAKER IN SITU (ICD-V45.01) Assessment: Comment Only  Problem # 3:  EDEMA (ICD-782.3) Assessment: Improved  Problem # 4:  PSORIASIS (ICD-696.1) Assessment: Improved Continue Medications per Dr.Tafeene in dermatology.  Problem # 5:  DIVERTICULOSIS, COLON, HX OF (ICD-V12.79) Assessment: Improved Continue high-fiber diet as tolerated  Patient Instructions: 1)  Copy sent to : Dr. Rudi Heap and Dr. Lesly Dukes in dermatology 2)  Please continue current medications.  3)  Labs Pending 4)  Upper abdominal ultrasound exam scheduled 5)  Please schedule a follow-up appointment in 4 to 6 weeks.  6)  We'll Reevaluate for possible liver biopsy 7)  The medication list was reviewed and reconciled.  All changed / newly prescribed medications were explained.  A complete medication list was provided to the patient / caregiver.

## 2010-03-24 NOTE — Cardiovascular Report (Signed)
Summary: Office Visit  Office Visit   Imported By: Roderic Ovens 09/11/2008 11:47:08  _____________________________________________________________________  External Attachment:    Type:   Image     Comment:   External Document

## 2010-03-24 NOTE — Cardiovascular Report (Signed)
Summary: Office Visit   Office Visit   Imported By: Roderic Ovens 05/19/2009 15:57:45  _____________________________________________________________________  External Attachment:    Type:   Image     Comment:   External Document

## 2010-03-24 NOTE — Cardiovascular Report (Signed)
Summary: Office Visit  Office Visit   Imported By: Marylou Mccoy 09/17/2008 13:30:15  _____________________________________________________________________  External Attachment:    Type:   Image     Comment:   External Document

## 2010-03-24 NOTE — Letter (Signed)
Summary: Severe Cutaneous Psoriasis/[facility]  Severe Cutaneous Psoriasis/[facility]   Imported By: Sherian Rein 04/29/2008 14:27:18  _____________________________________________________________________  External Attachment:    Type:   Image     Comment:   External Document

## 2010-03-24 NOTE — Assessment & Plan Note (Signed)
Summary: eph pc2 apt time 1:45   CC:  swelling around site.  History of Present Illness: [name] returns today for pacemaker followup.  She underwent PPM several days ago at [facility] secondary to symptomatic tachy-brady syndrome and pauses of over 5 seconds in conjunction with atrial fibrillation and a rapid ventricular response.  The patient was discharged home.  Several days ago, she noticed some swelling at the pacemaker insetion site and returns today out of concern that she may have a problem with the device.  She denies fever/chills/night sweats or tenderness around her insertion site.   Current Medications (verified): 1)  Levoxyl 50 Mcg Tabs (Levothyroxine Sodium) .Marland Kitchen.. 1 By Mouth Once Daily 2)  Paxil 10 Mg Tabs (Paroxetine Hcl) .Marland Kitchen.. 1 By Mouth Once Daily 3)  Metoprolol Succinate 25 Mg Xr24h-Tab (Metoprolol Succinate) .... Take One Tablet By Mouth Daily 4)  Protonix 40 Mg Tbec (Pantoprazole Sodium) .Marland Kitchen.. 1 By Mouth Once Daily 5)  Aspirin 81 Mg Tbec (Aspirin) .... Take One Tablet Two Times A Day 6)  Lorazepam 0.5 Mg Tabs (Lorazepam) .... Take At Bedtime As Needed 7)  Crestor 10 Mg Tabs (Rosuvastatin Calcium) .... Take 1 By Mouth Once Daily 8)  Methotrexate 2.5 Mg Tabs (Methotrexate Sodium) .... Take 4 By Mouth Every Week 9)  Torsemide 10 Mg Tabs (Torsemide) .... Take 1 By Mouth Once Daily 10)  Biestrogen 3.75 Mg .... Once Daily 11)  Fish Oil  Oil (Fish Oil) .... Take 2 Once Daily 12)  Flecainide Acetate 100 Mg Tabs (Flecainide Acetate) .... Take One Tablet By Mouth Every 12 Hours  Allergies (verified): 1)  ! Penicillin 2)  ! Codeine 3)  ! Doxycycline  Past History:  Past Medical History: Last updated: 06/10/2008 EDEMA (ICD-782.3) PREMATURE VENTRICULAR CONTRACTIONS (ICD-427.69) PSORIASIS (ICD-696.1) DIVERTICULOSIS, COLON, HX OF (ICD-V12.79) IRRITABLE BOWEL SYNDROME (ICD-564.1) ATRIAL FIBRILLATION (ICD-427.31) HYPOTHYROIDISM (ICD-244.9) HYPERTENSION  (ICD-401.9)  Review of Systems  The patient denies chest pain, syncope, dyspnea on exertion, and peripheral edema.    Physical Exam  General:  Well developed, well nourished, in no acute distress. Head:  normocephalic and atraumatic Eyes:  PERRLA/EOM intact; conjunctiva and lids normal. Mouth:  Teeth, gums and palate normal. Oral mucosa normal. Neck:  Neck supple, no JVD. No masses, thyromegaly or abnormal cervical nodes. Chest Wall:  Small hematoma at the device site.  No fluctuance, tenderness, or erythema noted. Lungs:  Clear bilaterally to auscultation and percussion. Heart:  Non-displaced PMI, chest non-tender; regular rate and rhythm, S1, S2 without murmurs, rubs or gallops. Carotid upstroke normal, no bruit. Normal abdominal aortic size, no bruits. Femorals normal pulses, no bruits. Pedals normal pulses. No edema, no varicosities. Abdomen:  Bowel sounds positive; abdomen soft and non-tender without masses, organomegaly, or hernias noted. No hepatosplenomegaly. Msk:  Back normal, normal gait. Muscle strength and tone normal. Pulses:  pulses normal in all 4 extremities Extremities:  No clubbing or cyanosis. Neurologic:  Alert and oriented x 3.   Vital Signs:  Patient profile:   65 year old female Height:      64 inches Weight:      178 pounds BMI:     30.66 Pulse rate:   56 / minute Pulse rhythm:   regular BP sitting:   177 / 79  (left arm) Cuff size:   large  Vitals Entered By: Flonnie Overman (September 01, 2008 2:04 PM)  PPM Follow Up Remote Check?  No Battery Voltage:  3.05 V     Battery Est.  Longevity:  9.5 YEARS     Pacer Dependent:  No       PPM Device Measurements Atrium  Amplitude: 4.7 mV, Impedance: 450 ohms, Threshold: 1.12 V at 0.5 msec Right Ventricle  Amplitude: 8.5 mV, Impedance: 490 ohms, Threshold: 1.0 V at 0.5 msec  Episodes MS Episodes:  23     Percent Mode Switch:  2.2%     Coumadin:  No Atrial Pacing:  64%     Ventricular Pacing:   <1%  Parameters Mode:  DDD     Lower Rate Limit:  50     Upper Rate Limit:  100 Paced AV Delay:  200     Sensed AV Delay:  150 Next Cardiology Appt Due:  10/21/2008 Tech Comments:  Rate response turned on because of blunted heart rate response. All other diagnostics normal for pt.  Pt has known afib, not on Coumadin.  Takes 2 ASA daily. Wound check done.  Steri-strips removed.  Dr. Ladona Ridgel looked at pt's wound also.  ROV Sept Dr Ladona Ridgel. Gypsy Balsam RN BSN  September 01, 2008 2:06 PM  MD Comments:  Normal device function.  Impression & Recommendations:  Problem # 1:  BRADYCARDIA (ICD-427.89) Her device is working normally.  There is no longer any symptomatic bradycardia. Her updated medication list for this problem includes:    Metoprolol Succinate 25 Mg Xr24h-tab (Metoprolol succinate) .Marland Kitchen... Take one tablet by mouth daily    Aspirin 81 Mg Tbec (Aspirin) .Marland Kitchen... Take one tablet two times a day    Flecainide Acetate 100 Mg Tabs (Flecainide acetate) .Marland Kitchen... Take one tablet by mouth every 12 hours  Problem # 2:  CARDIAC PACEMAKER IN SITU (ICD-V45.01) There appears to be a small hematoma at her pacemaker insertion site.  There is no evidence of any infection at this time.  I have asked her to call us if she has worsening swelling or fever or chills.  Problem # 3:  ATRIAL FIBRILLATION (ICD-427.31) Continue current meds as noted below.  She continues to refuse to take coumadin. Her updated medication list for this problem includes:    Metoprolol Succinate 25 Mg Xr24h-tab (Metoprolol succinate) .Marland Kitchen... Take one tablet by mouth daily    Aspirin 81 Mg Tbec (Aspirin) .Marland Kitchen... Take one tablet two times a day    Flecainide Acetate 100 Mg Tabs (Flecainide acetate) .Marland Kitchen... Take one tablet by mouth every 12 hours

## 2010-03-24 NOTE — Assessment & Plan Note (Signed)
Summary: 6 month fu/mt   Visit Type:  Follow-up Referring Provider:  Dr Jorja Loa Primary Provider:  Rudi Heap, MD   History of Present Illness: [name] returns today for followup.  She is a pleasant 65 yo woman with a h/o HTN and PAF and symptomatic bradycardia with long pauses who is s/p PPM.  No palpitations or syncope.  Today the patient c/o severe fatigue.  She gets tired during the day at work and sometimes falls asleep. She notes that her ex-husband used to tell her that she would snore and stop breathing!  This morning she felt like her heart was racing.  Also she c/o peripheral edema.  Current Medications (verified): 1)  Levothyroxine Sodium 75 Mcg Tabs (Levothyroxine Sodium) .Marland Kitchen.. 1 Tab Once Daily 1 1/2 Tab M,w,f 2)  Paxil 10 Mg Tabs (Paroxetine Hcl) .Marland Kitchen.. 1 By Mouth Once Daily 3)  Omeprazole 40 Mg Cpdr (Omeprazole) .... Take 1 Tablet By Mouth Once A Day 4)  Aspirin 81 Mg Tbec (Aspirin) .... Take One Tablet Two Times A Day 5)  Lorazepam 0.5 Mg Tabs (Lorazepam) .... As Needed 6)  Torsemide 20 Mg Tabs (Torsemide) .... Take 1 Tablet By Mouth Once A Day 7)  Biestrogen 3.75 Mg .... Once Daily 8)  Lovaza 1 Gm Caps (Omega-3-Acid Ethyl Esters) .... 2 Capsulse Two Times A Day 9)  Multivitamins   Tabs (Multiple Vitamin) .... Vitamin Pack Daily 10)  Lutein 10 Mg Tabs (Lutein) .... Take One Tablet By Mouth Once Daily. 11)  Biotin Plus Other Minerals .... Once Daily 12)  Carvedilol 12.5 Mg Tabs (Carvedilol) .Marland Kitchen.. 1 1/2 in Am 2 in Pm or As Directed 13)  Flecainide Acetate 100 Mg Tabs (Flecainide Acetate) .... Take One Tablet By Mouth Every 12 Hours 14)  Glucosamine-Chondroitin  Caps (Glucosamine-Chondroit-Vit C-Mn) .... Take1 Capsule By Mouth Once Daily 15)  Calcium 1200mg  .... Once Daily 16)  Fiber Diet  Tabs (Fiber) .... Uad 17)  Coq10 100 Mg Caps (Coenzyme Q10) .... Uad 18)  Stelara .... Uad  Allergies (verified): 1)  ! Penicillin 2)  ! Codeine 3)  ! Doxycycline 4)  !  Neosporin  Past History:  Past Medical History: Last updated: 06/10/2008 EDEMA (ICD-782.3) PREMATURE VENTRICULAR CONTRACTIONS (ICD-427.69) PSORIASIS (ICD-696.1) DIVERTICULOSIS, COLON, HX OF (ICD-V12.79) IRRITABLE BOWEL SYNDROME (ICD-564.1) ATRIAL FIBRILLATION (ICD-427.31) HYPOTHYROIDISM (ICD-244.9) HYPERTENSION (ICD-401.9)  Past Surgical History: Last updated: 03/09/2009 Pacemaker Placement Hysterectomy/Bladder tack/repair of rectocele  Vital Signs:  Patient profile:   65 year old female Height:      64 inches Weight:      185 pounds BMI:     31.87 Pulse rate:   62 / minute BP sitting:   110 / 80  (left arm)  Vitals Entered By: [name] CMA (December 22, 2009 4:03 PM)  Physical Exam  General:  Well developed, well nourished, no acute distress.healthy appearing. Head:  Normocephalic and atraumatic. Eyes:  PERRLA, no icterus.exam deferred to patient's ophthalmologist.   Mouth:  Teeth, gums and palate normal. Oral mucosa normal. Neck:  Supple; no masses or thyromegaly. Chest Wall:  Well healed pm incision. Lungs:  Clear throughout to auscultation. Heart:  Regular rate and rhythm; no murmurs, rubs,  or bruits. Abdomen:  Soft, nontender and nondistended. No masses, hepatosplenomegaly or hernias noted. Normal bowel sounds. Msk:  Symmetrical with no gross deformities. Normal posture. Pulses:  Normal pulses noted. Extremities:  No clubbing, cyanosis, edema or deformities noted.trace pedal edema.   Neurologic:  Alert and  oriented x4;  grossly normal neurologically.   PPM Specifications Following MD:  Pamela Bunting, MD     PPM Vendor:  [employer] Jude     PPM Model Number:  [id number]     PPM Serial Number:  [id number] PPM DOI:  08/19/2008     PPM Implanting MD:  Pamela Bunting, MD  Lead 1    Location: RA     DOI: 08/19/2008     Model #: [id number]     Serial #: [id number]     Status: active Lead 2    Location: RV     DOI: 08/19/2008     Model #: [id number]     Serial #: [id number]     Status:  active  Magnet Response Rate:  BOL 100 ERI 85    PPM Follow Up Battery Voltage:  2.98 V     Battery Est. Longevity:  8-12.3 yrs     Pacer Dependent:  No       PPM Device Measurements Atrium  Amplitude: 5.0 mV, Impedance: 440 ohms, Threshold: 0.75 V at 0.4 msec Right Ventricle  Amplitude: 11.4 mV, Impedance: 460 ohms, Threshold: 1.5 V at 0.4 msec  Episodes MS Episodes:  355     Percent Mode Switch:  <1%     Coumadin:  No Ventricular High Rate:  3     Atrial Pacing:  6.4%     Ventricular Pacing:  <1%  Parameters Mode:  DDD     Lower Rate Limit:  50     Upper Rate Limit:  100 Paced AV Delay:  200     Sensed AV Delay:  150 Next Remote Date:  03/24/2010     Tech Comments:  355 AMS EPISODES--LONGEST WAS 1 HR 10 MINUTES.  3 HVR EPISODES.  PT ABLE TO FEEL ACAP CONFIRM AND AUTOCAPTURE TESTING.  TURNED OFF BOTH AND SET RA OUTPUT TO 2.0 AND RV OUTPUT TO 3.00 DUE TO THRESHOLD TESTING.  PT WOULD LIKE TO BE ENROLLED IN MERLIN.  MERLIN TRANSMISSION 03-24-10. [name]  December 22, 2009 4:25 PM  Impression & Recommendations:  Problem # 1:  CARDIAC PACEMAKER IN SITU (ICD-V45.01) Her device is working normally.  Will recheck in several months.  Problem # 2:  SNORING (ICD-786.09) I am concerned about sleep apnea with the symptoms mentioned. I have asked her to followup with one of our sleep medicine specialists. Her updated medication list for this problem includes:    Aspirin 81 Mg Tbec (Aspirin) .Marland Kitchen... Take one tablet two times a day    Torsemide 20 Mg Tabs (Torsemide) .Marland Kitchen... Take 1 tablet by mouth once a day    Carvedilol 12.5 Mg Tabs (Carvedilol) .Marland Kitchen... 1 1/2 in am 2 in pm or as directed  Orders: Sleep Disorder Referral (Sleep Disorder)  Problem # 3:  ATRIAL FIBRILLATION (ICD-427.31) Her episode this morning was quite symptomatic.  I have asked her to take an extra coreg and/or flecainide if she has a recurrence. Her updated medication list for this problem includes:    Aspirin 81 Mg  Tbec (Aspirin) .Marland Kitchen... Take one tablet two times a day    Carvedilol 12.5 Mg Tabs (Carvedilol) .Marland Kitchen... 1 1/2 in am 2 in pm or as directed    Flecainide Acetate 100 Mg Tabs (Flecainide acetate) .Marland Kitchen... Take one tablet by mouth every 12 hours  Orders: Sleep Disorder Referral (Sleep Disorder)  Patient Instructions: 1)  Your physician wants you to follow-up in: 12 months with Dr Court Joy will receive a  reminder letter in the mail two months in advance. If you don't receive a letter, please call our office to schedule the follow-up appointment. 2)  Merlin 03/24/2010 3)  Your physician has recommended that you have a sleep study.  This test records several body functions during sleep, including:  brain activity, eye movement, oxygen and carbon dioxide blood levels, heart rate and rhythm, breathing rate and rhythm, the flow of air through your mouth and nose, snoring, body muscle movements, and chest and belly movement.

## 2010-03-24 NOTE — Assessment & Plan Note (Signed)
[facility]   Visit Type:  Follow-up Referring Provider:  Dr Jorja Loa Primary Provider:  Rudi Heap, MD   History of Present Illness: [name] returns today for followup.  She is a pleasant 65 yo woman with a h/o HTN and PAF and symptomatic bradycardia with long pauses who is s/p PPM.  Since her PPM was placed she has been fairly asymptomatic except that she has trouble with her blood pressure.  She was recently started on Bystolic from metoprolol though she has taken only a few doses.  Her systolic  blood pressures have been in the 170-190 range.  Current Medications (verified): 1)  Levoxyl 50 Mcg Tabs (Levothyroxine Sodium) .Marland Kitchen.. 1 By Mouth Once Daily 2)  Paxil 10 Mg Tabs (Paroxetine Hcl) .Marland Kitchen.. 1 By Mouth Once Daily 3)  Bystolic 5 Mg Tabs (Nebivolol Hcl) .... Take One Tablet By Mouth Once Daily. 4)  Omeprazole 40 Mg Cpdr (Omeprazole) .... Take 1 Tablet By Mouth Once A Day 5)  Aspirin 81 Mg Tbec (Aspirin) .... Take One Tablet Two Times A Day 6)  Lorazepam 0.5 Mg Tabs (Lorazepam) .... As Needed 7)  Crestor 10 Mg Tabs (Rosuvastatin Calcium) .... Take 1 By Mouth Once Daily 8)  Methotrexate 2.5 Mg Tabs (Methotrexate Sodium) .... Take 2 By Mouth Every Week 9)  Torsemide 20 Mg Tabs (Torsemide) .... Take 1 Tablet By Mouth Once A Day 10)  Biestrogen 3.75 Mg .... Once Daily 11)  Lovaza 1 Gm Caps (Omega-3-Acid Ethyl Esters) .... 2 Capsulse Two Times A Day 12)  Multivitamins   Tabs (Multiple Vitamin) .... Vitamin Pack Daily 13)  Lutein 10 Mg Tabs (Lutein) .... Take One Tablet By Mouth Once Daily. 14)  Biotin Plus Other Minerals .... Once Daily  Allergies (verified): 1)  ! Penicillin 2)  ! Codeine 3)  ! Doxycycline  Past History:  Past Medical History: Last updated: 06/10/2008 EDEMA (ICD-782.3) PREMATURE VENTRICULAR CONTRACTIONS (ICD-427.69) PSORIASIS (ICD-696.1) DIVERTICULOSIS, COLON, HX OF (ICD-V12.79) IRRITABLE BOWEL SYNDROME (ICD-564.1) ATRIAL FIBRILLATION  (ICD-427.31) HYPOTHYROIDISM (ICD-244.9) HYPERTENSION (ICD-401.9)  Past Surgical History: Last updated: 03/09/2009 Pacemaker Placement Hysterectomy/Bladder tack/repair of rectocele  Review of Systems  The patient denies chest pain, syncope, dyspnea on exertion, and peripheral edema.    Vital Signs:  Patient profile:   65 year old female Height:      64 inches Weight:      182 pounds BMI:     31.35 BP sitting:   160 / 90  (left arm)  Vitals Entered By: Laurance Flatten CMA (March 12, 2009 3:04 PM)  Physical Exam  General:  Well developed, well nourished, no acute distress.healthy appearing.   Head:  Normocephalic and atraumatic. Eyes:  PERRLA, no icterus.exam deferred to patient's ophthalmologist.     PPM Specifications Following MD:  Lewayne Bunting, MD     [facility] Vendor:  St Jude     PPM Model Number:  [id number]     PPM Serial Number:  [id number] PPM DOI:  08/19/2008     PPM Implanting MD:  Lewayne Bunting, MD  Lead 1    Location: RA     DOI: 08/19/2008     Model #: [id number]     Serial #: [id number]     Status: active Lead 2    Location: RV     DOI: 08/19/2008     Model #: [id number]     Serial #: [id number]     Status: active  Magnet Response Rate:  BOL 100 ERI 85  PPM Follow Up Remote Check?  No Battery Voltage:  2.99 V     Battery Est. Longevity:  11.6 years     Pacer Dependent:  No       PPM Device Measurements Atrium  Amplitude: 5 mV, Impedance: 460 ohms, Threshold: 0.875 V at 0.4 msec Right Ventricle  Amplitude: 10.4 mV, Impedance: 480 ohms, Threshold: 1.125 V at 0.4 msec  Episodes MS Episodes:  34     Percent Mode Switch:  <1%     Coumadin:  No Atrial Pacing:  8.1%     Ventricular Pacing:  <1%  Parameters Mode:  DDD     Lower Rate Limit:  50     Upper Rate Limit:  100 Paced AV Delay:  200     Sensed AV Delay:  150 Next Cardiology Appt Due:  07/21/2009 Tech Comments:  No parameter changes.  Device function normal.  Checked by industry.  ROV 6/11 Dr. Ladona Ridgel. Altha Harm, LPN   March 12, 2009 3:00 PM  MD Comments:  Agree with above.  Impression & Recommendations:  Problem # 1:  CARDIAC PACEMAKER IN SITU (ICD-V45.01) Her device is working normally today. Will recheck in several months.  Problem # 2:  ATRIAL FIBRILLATION (ICD-427.31) Despite not being on an anti-arrhythmic drug, she appears to be maintaining NSR.  Will follow her symptoms. The following medications were removed from the medication list:    Metoprolol Succinate 25 Mg Xr24h-tab (Metoprolol succinate) .Marland Kitchen... Take one tablet once daily Her updated medication list for this problem includes:    Aspirin 81 Mg Tbec (Aspirin) .Marland Kitchen... Take one tablet two times a day    Carvedilol 12.5 Mg Tabs (Carvedilol) .Marland Kitchen... Take one tablet by mouth twice a day  Problem # 3:  HYPERTENSION (ICD-401.9) Her blood pressure remains poorly controlled. Today, I have asked her to switch from Bystolic to Carvedilol and have asked her to uptitrate her meds over the coming weeks. I have asked her to followup her blood pressure checks with Dr. Christell Constant for uptitration of meds. The following medications were removed from the medication list:    Metoprolol Succinate 25 Mg Xr24h-tab (Metoprolol succinate) .Marland Kitchen... Take one tablet once daily Her updated medication list for this problem includes:    Aspirin 81 Mg Tbec (Aspirin) .Marland Kitchen... Take one tablet two times a day    Torsemide 20 Mg Tabs (Torsemide) .Marland Kitchen... Take 1 tablet by mouth once a day    Carvedilol 12.5 Mg Tabs (Carvedilol) .Marland Kitchen... Take one tablet by mouth twice a day  Patient Instructions: 1)  Your physician recommends that you schedule a follow-up appointment in:  8 weekswith Dr Ladona Ridgel 2)  Your physician has recommended you make the following change in your medication: d/c Bystolic and start Coreg 12.5mg  two times a day for 2 weeks then increase to 1 1/2 tablets two times a day for two weeks then increase to two tablets twice daily  Prescriptions: CARVEDILOL 12.5 MG TABS (CARVEDILOL) Take  one tablet by mouth twice a day  #60 x 3   Entered by:   Dennis Bast, RN, BSN   Authorized by:   Laren Boom, MD, [facility]   Signed by:   Dennis Bast, RN, BSN on 03/12/2009   Method used:   [facility] to        [id number]retail[facility]       [address]       [place], [place]  [address]  Ph: [phone] or [phone]       Fax: [phone]   RxID:   [id number]   Appended Document[facility] Pt is taking Flecainide 100mg  bid

## 2010-03-24 NOTE — Cardiovascular Report (Signed)
Summary: Implantable Device Insertion  Implantable Device Insertion   Imported By: Kassie Mends 09/23/2008 13:09:24  _____________________________________________________________________  External Attachment:    Type:   Image     Comment:   External Document

## 2010-03-24 NOTE — Assessment & Plan Note (Signed)
Summary: [facility]  Medications Added ASPIRIN 81 MG TBEC (ASPIRIN) Take one tablet two times a day LORAZEPAM 0.5 MG TABS (LORAZEPAM) take at bedtime as needed CRESTOR 10 MG TABS (ROSUVASTATIN CALCIUM) take 1 by mouth once daily METHOTREXATE 2.5 MG TABS (METHOTREXATE SODIUM) Take 4 by mouth every week TORSEMIDE 10 MG TABS (TORSEMIDE) Take 1 by mouth once daily * BIESTROGEN 3.75 MG once daily FISH OIL  OIL (FISH OIL) Take 2 once daily FLECAINIDE ACETATE 50 MG TABS (FLECAINIDE ACETATE) Take one tablet by mouth every 12 hours for 14 days, therafter 2 tablets as needed      Allergies Added: ! PENICILLIN ! CODEINE ! DOXYCYCLINE  CC:  A. Fib.  History of Present Illness: [name] returns today for followup.  She is a pleasant middle aged woman with a h/o atrial fibrillation and hypertension who returns c/o increasing palpitations and symptomatic atrial fibrillation.  Her symptoms have increased in frequency and duration.  I have recommended anti-arrhythmic drug therapy in the past but she has refused.  She denies c/p, SOB, or syncope.  Current Medications (verified): 1)  Levoxyl 50 Mcg Tabs (Levothyroxine Sodium) .Marland Kitchen.. 1 By Mouth Once Daily 2)  Paxil 10 Mg Tabs (Paroxetine Hcl) .Marland Kitchen.. 1 By Mouth Once Daily 3)  Metoprolol Succinate 25 Mg Xr24h-Tab (Metoprolol Succinate) .... Take One Tablet By Mouth Daily 4)  Protonix 40 Mg Tbec (Pantoprazole Sodium) .Marland Kitchen.. 1 By Mouth Once Daily 5)  Aspirin 81 Mg Tbec (Aspirin) .... Take One Tablet Two Times A Day 6)  Lorazepam 0.5 Mg Tabs (Lorazepam) .... Take At Bedtime As Needed 7)  Crestor 10 Mg Tabs (Rosuvastatin Calcium) .... Take 1 By Mouth Once Daily 8)  Methotrexate 2.5 Mg Tabs (Methotrexate Sodium) .... Take 4 By Mouth Every Week 9)  Torsemide 10 Mg Tabs (Torsemide) .... Take 1 By Mouth Once Daily 10)  Biestrogen 3.75 Mg .... Once Daily 11)  Fish Oil  Oil (Fish Oil) .... Take 2 Once Daily  Allergies (verified): 1)  ! Penicillin 2)  !  Codeine 3)  ! Doxycycline  Past History:  Past Medical History:    EDEMA (ICD-782.3)    PREMATURE VENTRICULAR CONTRACTIONS (ICD-427.69)    PSORIASIS (ICD-696.1)    DIVERTICULOSIS, COLON, HX OF (ICD-V12.79)    IRRITABLE BOWEL SYNDROME (ICD-564.1)    ATRIAL FIBRILLATION (ICD-427.31)    HYPOTHYROIDISM (ICD-244.9)    HYPERTENSION (ICD-401.9)  Review of Systems       The patient complains of dyspnea on exertion.  The patient denies chest pain and peripheral edema.    Vital Signs:  Patient profile:   65 year old female Height:      64 inches Weight:      177 pounds BMI:     30.49 Pulse rate:   70 / minute BP sitting:   151 / 66  (left arm)  Vitals Entered By: Stanton Kidney, EMT-P (June 10, 2008 4:13 PM)  Physical Exam  General:  Well developed, well nourished, in no acute distress. Head:  normocephalic and atraumatic Eyes:  PERRLA/EOM intact; conjunctiva and lids normal. Mouth:  Teeth, gums and palate normal. Oral mucosa normal. Neck:  Neck supple, no JVD. No masses, thyromegaly or abnormal cervical nodes. Lungs:  Clear bilaterally to auscultation and percussion. Heart:  Non-displaced PMI, chest non-tender; regular rate and rhythm, S1, S2 without murmurs, rubs or gallops. Carotid upstroke normal, no bruit. Normal abdominal aortic size, no bruits. Femorals normal pulses, no bruits. Pedals normal pulses. No edema, no varicosities.  Abdomen:  Bowel sounds positive; abdomen soft and non-tender without masses, organomegaly, or hernias noted. No hepatosplenomegaly. Msk:  Mild psoriatic plaques Pulses:  pulses normal in all 4 extremities Extremities:  No clubbing or cyanosis. Neurologic:  Alert and oriented x 3.   EKG  Procedure date:  06/10/2008  Findings:      Normal sinus rhythm with rate of:  61  Impression & Recommendations:  Problem # 1:  ATRIAL FIBRILLATION (ICD-427.31) Her symptoms are worse.  I have recommended starting flecainide 50 mg twice daily for two weeks then  increasing to 100 mg twice daily.  If her symptoms are improved on the lower dose thant we would keep at 50 two times a day.  She will be scheduled for an exercise test after initiation of the flecainide. Her updated medication list for this problem includes:    Metoprolol Succinate 25 Mg Xr24h-tab (Metoprolol succinate) .Marland Kitchen... Take one tablet by mouth daily    Aspirin 81 Mg Tbec (Aspirin) .Marland Kitchen... Take one tablet two times a day    Flecainide Acetate 50 Mg Tabs (Flecainide acetate) .Marland Kitchen... Take one tablet by mouth every 12 hours for 14 days, therafter 2 tablets as needed  Problem # 2:  HYPERTENSION (ICD-401.9) Her pressure is still not optimally controlled.  Will uptitrate meds if her pressure remains elevated at her next visit. Her updated medication list for this problem includes:    Metoprolol Succinate 25 Mg Xr24h-tab (Metoprolol succinate) .Marland Kitchen... Take one tablet by mouth daily    Aspirin 81 Mg Tbec (Aspirin) .Marland Kitchen... Take one tablet two times a day    Torsemide 10 Mg Tabs (Torsemide) .Marland Kitchen... Take 1 by mouth once daily  Complete Medication List: 1)  Levoxyl 50 Mcg Tabs (Levothyroxine sodium) .Marland Kitchen.. 1 by mouth once daily 2)  Paxil 10 Mg Tabs (Paroxetine hcl) .Marland Kitchen.. 1 by mouth once daily 3)  Metoprolol Succinate 25 Mg Xr24h-tab (Metoprolol succinate) .... Take one tablet by mouth daily 4)  Protonix 40 Mg Tbec (Pantoprazole sodium) .Marland Kitchen.. 1 by mouth once daily 5)  Aspirin 81 Mg Tbec (Aspirin) .... Take one tablet two times a day 6)  Lorazepam 0.5 Mg Tabs (Lorazepam) .... Take at bedtime as needed 7)  Crestor 10 Mg Tabs (Rosuvastatin calcium) .... Take 1 by mouth once daily 8)  Methotrexate 2.5 Mg Tabs (Methotrexate sodium) .... Take 4 by mouth every week 9)  Torsemide 10 Mg Tabs (Torsemide) .... Take 1 by mouth once daily 10)  Biestrogen 3.75 Mg  .... Once daily 11)  Fish Oil Oil (Fish oil) .... Take 2 once daily 12)  Flecainide Acetate 50 Mg Tabs (Flecainide acetate) .... Take one tablet by mouth every 12  hours for 14 days, therafter 2 tablets as needed  Patient Instructions: 1)  Your physician recommends that you schedule a follow-up appointment in: 4 months 2)  Your physician has recommended you make the following change in your medication: Flecainide Acetate 50 Mg Tabs... Take one tablet by mouth every 12 hours for 14 days, therafter take 2 tablets as needed. 3)  No vigorous exercise. 4)  Your physician has requested that you have an exercise tolerance test.  For further information please visit [email or web address]  Please also follow instruction sheet, as given. Prescriptions: FLECAINIDE ACETATE 50 MG TABS (FLECAINIDE ACETATE) Take one tablet by mouth every 12 hours for 14 days, therafter 2 tablets as needed  #64 x 11   Entered by:   Bernita Raisin, RN, BSN   Authorized by:  Laren Boom, MD, [facility]   Signed by:   Bernita Raisin, RN, BSN on 06/10/2008   Method used:   [facility]        [facility] [address] (retail)       [address]       [place], [place]  [address]       Ph: [phone] or [phone]       Fax: [phone]   RxID:   [id number]

## 2010-03-24 NOTE — Letter (Signed)
Summary: Designer, fashion/clothing, Main Office  [address]   [place], [place] [address]   Phone: [phone]  Fax: [phone]    May 06, 2009  [facility] [address] [facility] [address] Clerks Office [address] [place], [place] [address]  [place] [address]  NG:[id number]  Juror: #______   Dear Milford Cage:   [name] of [name], [name] has just informed me that she has been chosen to serve on the jury beginning the 3rd Monday of March and goes throughthe 3rd Monday in May.  She is a patient under my care. She has a heart Condition and has a pacemaker. I do not feel that she should serve on the jury. I would like to request that she be excused from jury duty permanently.  Your consideration of this matter is greatly appreciated.  Respectfully,   Dr Sharlot Gowda [facility]

## 2010-03-24 NOTE — Progress Notes (Signed)
Summary: high b/p, sob  Phone Note Call from Patient Call back at Home Phone [phone] Call back at [phone]   Caller: Patient Reason for Call: Talk to Nurse Details for Reason: c/ o b/p high 158/97. this am last night 192/109.. heart racing , wake pt up at night. no cp. some sob.  Initial call taken by: Lorne Skeens,  March 23, 2009 8:19 AM  Follow-up for Phone Call        per pt calling back Lorne Skeens  March 23, 2009 2:07 PM  Spoke with pt.  After talking with Dr Ladona Ridgel instructed pt to increase Coreg to 12.5mg  1 1/2 tablets two times a day Will call pt back later in the week to see if this is getting better. Dennis Bast, RN, BSN  March 23, 2009 2:59 PM spoke with pt her Thyroid is up and her meds have been adjusted this could have been her problem all along.  She will call me back after it is checked and back to normal.  since her meds have been adjusted she will hopefully see some improvement. Dennis Bast, RN, BSN  April 05, 2009 3:42 PM    New/Updated Medications: FLECAINIDE ACETATE 100 MG TABS (FLECAINIDE ACETATE) Take one tablet by mouth every 12 hours

## 2010-03-24 NOTE — Progress Notes (Signed)
Summary: INCISION SITE  Phone Note Call from Patient Call back at Home Phone [phone] Call back at [phone] Message from:  Patient on August 31, 2008 3:20 PM  Caller: Patient Reason for Call: Talk to Nurse Summary of Call: SWOLLEN BY INCISION SITE, HOT TO THE TOUCH,  Initial call taken by: Lorne Skeens,  August 31, 2008 3:20 PM  Follow-up for Phone Call        pt states she feel last Tues or Jhonnie Garner and braced with that arm.  Will take a look at site tomorrow at 1:45 due to site swelling and hot to touch. Dennis Bast, RN, BSN  August 31, 2008 3:42 PM

## 2010-03-24 NOTE — Progress Notes (Signed)
Summary: needs refill on flecainide since medication has been doubled  Medications Added FLECAINIDE ACETATE 100 MG TABS (FLECAINIDE ACETATE) Take one tablet by mouth every 12 hours       Phone Note Refill Request Call back at [phone] Message from:  [facility] [place]  / [phone]  Refills Requested: Medication #1:  FLECAINIDE ACETATE 50 MG TABS Take one tablet by mouth every 12 hours for 14 days because pt has to double her medication mentioned above she needs a new refill called in because what she has is going to run out  Initial call taken by: Omer Jack,  Jul 15, 2008 12:29 PM Caller: Patient    New/Updated Medications: FLECAINIDE ACETATE 100 MG TABS (FLECAINIDE ACETATE) Take one tablet by mouth every 12 hours   Prescriptions: FLECAINIDE ACETATE 100 MG TABS (FLECAINIDE ACETATE) Take one tablet by mouth every 12 hours  #60 x 12   Entered by:   Flonnie Overman   Authorized by:   Laren Boom, MD, [facility]   Signed by:   Flonnie Overman on 07/17/2008   Method used:   [facility] to        [facility] [address] (retail)       [address]       [place], [place]  [address]       Ph: [phone] or [phone]       Fax: [phone]   RxID:   [id number]

## 2010-03-24 NOTE — Progress Notes (Signed)
Summary: Liver BX  Phone Note Call from Patient Call back at [phone]   Call For: DR PATTERSON Reason for Call: Talk to Nurse Summary of Call: Had a liver Bx and says Lupita Leash told her if she had not heard from [place] by today to call office. Initial call taken by: Leanor Kail [facility],  October 06, 2009 10:03 AM  Follow-up for Phone Call        Pt calling, asking about resullts of liver bx.  See report. Follow-up by: Ashok Cordia RN,  October 06, 2009 12:07 PM  Additional Follow-up for Phone Call Additional follow up Details #1::        fatty liver disease please schedule appointment to see me and talk over results. no cirrhosis or anything to worry about now Additional Follow-up by: Mardella Layman MD FACG,  October 06, 2009 12:25 PM    Additional Follow-up for Phone Call Additional follow up Details #2::    Pt notified.  Appt sch. Follow-up by: Ashok Cordia RN,  October 06, 2009 12:51 PM   Appended Document: Liver BX    Clinical Lists Changes  Problems: Added new problem of OTHER CHRONIC NONALCOHOLIC LIVER DISEASE (ICD-571.8)

## 2010-03-24 NOTE — Assessment & Plan Note (Signed)
Summary: F/U APPT...LSW.   History of Present Illness Visit Type: Follow-up Visit Primary GI MD: Sheryn Bison MD FACP FAGA Primary Provider: Rudi Heap, MD Chief Complaint: follow-up  History of Present Illness:   This patient is asymptomatic except for chronic IBS. Repeat liver function test her again approximately twice normal. Upper abdominal ultrasound exam suggested fatty infiltration of the liver. Workup otherwise for metabolic liver disease has been negative. She denies a current hepatobiliary or upper gastrointestinal problems. She is on daily omeprazole for GERD. She is on chronic methotrexate 2.5 mg 2 tablets every week for psoriasis.   GI Review of Systems      Denies abdominal pain, acid reflux, belching, bloating, chest pain, dysphagia with liquids, dysphagia with solids, heartburn, loss of appetite, nausea, vomiting, vomiting blood, weight loss, and  weight gain.        Denies anal fissure, black tarry stools, change in bowel habit, constipation, diarrhea, diverticulosis, fecal incontinence, heme positive stool, hemorrhoids, irritable bowel syndrome, jaundice, light color stool, liver problems, rectal bleeding, and  rectal pain.    Current Medications (verified): 1)  Levoxyl 50 Mcg Tabs (Levothyroxine Sodium) .Marland Kitchen.. 1 By Mouth Once Daily 2)  Paxil 10 Mg Tabs (Paroxetine Hcl) .Marland Kitchen.. 1 By Mouth Once Daily 3)  Omeprazole 40 Mg Cpdr (Omeprazole) .... Take 1 Tablet By Mouth Once A Day 4)  Aspirin 81 Mg Tbec (Aspirin) .... Take One Tablet Two Times A Day 5)  Lorazepam 0.5 Mg Tabs (Lorazepam) .... As Needed 6)  Crestor 10 Mg Tabs (Rosuvastatin Calcium) .... Take 1 By Mouth Once Daily 7)  Methotrexate 2.5 Mg Tabs (Methotrexate Sodium) .... Take 2 By Mouth Every Week 8)  Torsemide 20 Mg Tabs (Torsemide) .... Take 1 Tablet By Mouth Once A Day 9)  Biestrogen 3.75 Mg .... Once Daily 10)  Lovaza 1 Gm Caps (Omega-3-Acid Ethyl Esters) .... 2 Capsulse Two Times A Day 11)  Multivitamins    Tabs (Multiple Vitamin) .... Vitamin Pack Daily 12)  Lutein 10 Mg Tabs (Lutein) .... Take One Tablet By Mouth Once Daily. 13)  Biotin Plus Other Minerals .... Once Daily 14)  Carvedilol 12.5 Mg Tabs (Carvedilol) .... Take Two Tablet By Mouth Twice A Day 15)  Flecainide Acetate 100 Mg Tabs (Flecainide Acetate) .... Take One Tablet By Mouth Every 12 Hours  Allergies (verified): 1)  ! Penicillin 2)  ! Codeine 3)  ! Doxycycline  Past History:  Past medical, surgical, family and social histories (including risk factors) reviewed for relevance to current acute and chronic problems.  Past Medical History: Reviewed history from 06/10/2008 and no changes required. EDEMA (ICD-782.3) PREMATURE VENTRICULAR CONTRACTIONS (ICD-427.69) PSORIASIS (ICD-696.1) DIVERTICULOSIS, COLON, HX OF (ICD-V12.79) IRRITABLE BOWEL SYNDROME (ICD-564.1) ATRIAL FIBRILLATION (ICD-427.31) HYPOTHYROIDISM (ICD-244.9) HYPERTENSION (ICD-401.9)  Past Surgical History: Reviewed history from 03/09/2009 and no changes required. Pacemaker Placement Hysterectomy/Bladder tack/repair of rectocele  Family History: Reviewed history from 03/09/2009 and no changes required.  Positive for coronary disease Family History of Breast Cancer: 1/2 sister No FH of Colon Cancer: Family History of Ovarian Cancer: 1/2 sister Family History of Uterine Cancer: Aunt Family History of Crohn's: Neice Family History of Diabetes: Mother, Maternal Grandmother, Sister Family History of Heart Disease: Mother, Father, Brother x 2   Social History: Reviewed history from 03/09/2009 and no changes required. She is not married. Patient is a former smoker. -smoked as a teenager Alcohol Use - no Daily Caffeine Use-1-2 cups daily Illicit Drug Use - no Patient does not get regular exercise.  Review of Systems       The patient complains of back pain and sleeping problems.  The patient denies allergy/sinus, anemia, anxiety-new, arthritis/joint  pain, blood in urine, breast changes/lumps, change in vision, confusion, cough, coughing up blood, depression-new, fainting, fatigue, fever, headaches-new, hearing problems, heart murmur, heart rhythm changes, itching, muscle pains/cramps, night sweats, nosebleeds, shortness of breath, skin rash, sore throat, swelling of feet/legs, swollen lymph glands, thirst - excessive, urination - excessive, urination changes/pain, urine leakage, vision changes, and voice change.    Vital Signs:  Patient profile:   65 year old female Height:      64 inches Weight:      184 pounds BMI:     31.70 Pulse rate:   64 / minute Pulse rhythm:   regular BP sitting:   128 / 74  (left arm)  Vitals Entered By: Milford Cage NCMA (April 13, 2009 2:58 PM)  Physical Exam  General:  Well developed, well nourished, no acute distress.healthy appearing.   Head:  Normocephalic and atraumatic. Eyes:  PERRLA, no icterus. Lungs:  Clear throughout to auscultation. Heart:  Regular rate and rhythm; no murmurs, rubs,  or bruits. Psych:  Alert and cooperative. Normal mood and affect.   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 I have suggested strongly to her that we proceed with percutaneous liver biopsy which she was to put all for 6-12 weeks because of work-related problems. I would be hesitant to continue her methotrexate until this has been completed. It is also noted the patient is on daily Crestor therapy.  Problem # 3:  PSORIASIS (ICD-696.1) Assessment: Improved  Problem # 4:  IRRITABLE BOWEL SYNDROME (ICD-564.1) Assessment: Improved  Patient Instructions: 1)  Copy sent to : Dr. Rudi Heap and Dr. Lesly Dukes and dermatology. 2)  Please continue current medications.  3)  Ultrasound Guided liver biopsy recommended with patient off of  salicylates and NSAIDs for one week. 4)  Patient will call back in one month to set up [place] live biopsy. 5)  The medication list was reviewed and reconciled.  All changed / newly prescribed medications were explained.  A complete medication list was provided to the patient / caregiver.

## 2010-03-24 NOTE — Progress Notes (Signed)
Summary: talk with nurs./ refill meds   Phone Note Call from Patient Call back at Home Phone [phone] Call back at [phone] Message from:  Patient on April 20, 2009 11:21 AM  Refills Requested: Medication #1:  CARVEDILOL 12.5 MG TABS Take two tablet by mouth twice a day [facility] [place]   Reason for Call: Talk to Nurse Details for Reason: pt would like a called to clarify direction of meds.  Initial call taken by: Lorne Skeens,  April 20, 2009 11:22 AM  Follow-up for Phone Call        will call in new rx for Carvedilol 12.5mg  2 tablets twice daily Dennis Bast, RN, BSN  April 20, 2009 11:39 AM    Prescriptions: CARVEDILOL 12.5 MG TABS (CARVEDILOL) Take two tablet by mouth twice a day  #120 x 6   Entered by:   Dennis Bast, RN, BSN   Authorized by:   Laren Boom, MD, [facility]   Signed by:   Dennis Bast, RN, BSN on 04/20/2009   Method used:   [facility] to        [facility] [address] (retail)       [address]       [place], [place]  [address]       Ph: [phone] or [phone]       Fax: [phone]   RxID:   [id number]

## 2010-03-24 NOTE — Progress Notes (Signed)
Summary: pt having reaction to medication  Phone Note Call from Patient Call back at [phone]   Caller: Patient Reason for Call: Talk to Doctor Complaint: Nausea/Vomiting/Diarrhea Summary of Call: pt has questions about her new medication and she thinks she is having a reaction. Pt had a-fib attack since she has been on this medication Initial call taken by: Omer Jack,  December 24, 2009 2:29 PM  Follow-up for Phone Call        Pt. is concern that the medication Stelara injection  her Dermatologist gave her, is causing to have a reaction. Pt. states  this past monday, she got a Stelara injection. On Tuesday night ,she was in A-fib. and on Wednesday AM, while she was getting a shower her HR got up to 200/beats/min, Pt. states she almost passed out.This morning pt. said she had a irregular heart rate. Now she is doing fine. Pt. would like to know if it is Stelara that is making her to go in A-fib.  Pt. said, she is due for another Stelara injection in 3 weeks. Follow-up by: Ollen Gross, RN, BSN,  December 24, 2009 2:59 PM  Additional Follow-up for Phone Call Additional follow up Details #1::        Discussed the above with Weston Brass Pharm D  she cant not find any any cardiac side effects to the medication.  When the patient takes the next injection she should report to the compant if the same thing happens and let me know if she goes out of rhythm as well  pt verbalized understanding Dennis Bast, RN, BSN  December 29, 2009 9:35 AM

## 2010-03-24 NOTE — Assessment & Plan Note (Signed)
Summary: Discuss liver bx/dfs   History of Present Illness Visit Type: Follow-up Visit Primary GI MD: Sheryn Bison MD FACP FAGA Primary Provider: Rudi Heap, MD Chief Complaint: Discuss liver biopsy History of Present Illness:   Recent liver biopsy results reviewed with patient, and also call her dermatologist Dr. Tresa Res and spoke with him concerning case. She has severe pustular psoriasis which is only responded to methotrexate low-dose weekly therapy. Her psoriasis currently is in remission. She is asymptomatic in terms of any liver disease or symptoms of hepatic insufficiency.   GI Review of Systems      Denies abdominal pain, acid reflux, belching, bloating, chest pain, dysphagia with liquids, dysphagia with solids, heartburn, loss of appetite, nausea, vomiting, vomiting blood, weight loss, and  weight gain.        Denies anal fissure, black tarry stools, change in bowel habit, constipation, diarrhea, diverticulosis, fecal incontinence, heme positive stool, hemorrhoids, irritable bowel syndrome, jaundice, light color stool, liver problems, rectal bleeding, and  rectal pain.    Current Medications (verified): 1)  Levoxyl 50 Mcg Tabs (Levothyroxine Sodium) .Marland Kitchen.. 1 1/2 Tablet  By Mouth Once Daily 2)  Paxil 10 Mg Tabs (Paroxetine Hcl) .Marland Kitchen.. 1 By Mouth Once Daily 3)  Omeprazole 40 Mg Cpdr (Omeprazole) .... Take 1 Tablet By Mouth Once A Day 4)  Aspirin 81 Mg Tbec (Aspirin) .... Take One Tablet Two Times A Day 5)  Lorazepam 0.5 Mg Tabs (Lorazepam) .... As Needed 6)  Methotrexate 2.5 Mg Tabs (Methotrexate Sodium) .... Take 2 By Mouth Every Week (Holding) 7)  Torsemide 20 Mg Tabs (Torsemide) .... Take 1 Tablet By Mouth Once A Day 8)  Biestrogen 3.75 Mg .... Once Daily 9)  Lovaza 1 Gm Caps (Omega-3-Acid Ethyl Esters) .... 2 Capsulse Two Times A Day 10)  Multivitamins   Tabs (Multiple Vitamin) .... Vitamin Pack Daily 11)  Lutein 10 Mg Tabs (Lutein) .... Take One Tablet By Mouth  Once Daily. 12)  Biotin Plus Other Minerals .... Once Daily 13)  Carvedilol 12.5 Mg Tabs (Carvedilol) .Marland Kitchen.. 1 1/2 in Am 2 in Pm 14)  Flecainide Acetate 100 Mg Tabs (Flecainide Acetate) .... Take One Tablet By Mouth Every 12 Hours 15)  Glucosamine-Chondroitin  Caps (Glucosamine-Chondroit-Vit C-Mn) .... Take1 Capsule By Mouth Once Daily 16)  Calcium 1200mg  .... Once Daily  Allergies (verified): 1)  ! Penicillin 2)  ! Codeine 3)  ! Doxycycline 4)  ! Neosporin  Past History:  Past medical, surgical, family and social histories (including risk factors) reviewed for relevance to current acute and chronic problems.  Past Medical History: Reviewed history from 06/10/2008 and no changes required. EDEMA (ICD-782.3) PREMATURE VENTRICULAR CONTRACTIONS (ICD-427.69) PSORIASIS (ICD-696.1) DIVERTICULOSIS, COLON, HX OF (ICD-V12.79) IRRITABLE BOWEL SYNDROME (ICD-564.1) ATRIAL FIBRILLATION (ICD-427.31) HYPOTHYROIDISM (ICD-244.9) HYPERTENSION (ICD-401.9)  Past Surgical History: Reviewed history from 03/09/2009 and no changes required. Pacemaker Placement Hysterectomy/Bladder tack/repair of rectocele  Family History: Reviewed history from 03/09/2009 and no changes required.  Positive for coronary disease Family History of Breast Cancer: 1/2 sister No FH of Colon Cancer: Family History of Ovarian Cancer: 1/2 sister Family History of Uterine Cancer: Aunt Family History of Crohn's: Neice Family History of Diabetes: Mother, Maternal Grandmother, Sister Family History of Heart Disease: Mother, Father, Brother x 2   Social History: Reviewed history from 03/09/2009 and no changes required. She is not married. Patient is a former smoker. -smoked as a teenager Alcohol Use - no Daily Caffeine Use-1-2 cups daily Illicit Drug Use - no Patient does  not get regular exercise.   Review of Systems       The patient complains of swelling of feet/legs, arthritis/joint pain, and fatigue.  The patient  denies allergy/sinus, anemia, anxiety-new, back pain, blood in urine, breast changes/lumps, change in vision, confusion, cough, coughing up blood, depression-new, fainting, fever, headaches-new, hearing problems, heart murmur, heart rhythm changes, itching, menstrual pain, muscle pains/cramps, night sweats, nosebleeds, pregnancy symptoms, shortness of breath, skin rash, sleeping problems, sore throat, swollen lymph glands, thirst - excessive , urination - excessive , urination changes/pain, urine leakage, vision changes, and voice change.    Vital Signs:  Patient profile:   65 year old female Height:      64 inches Weight:      187.50 pounds BMI:     32.30 Pulse rate:   80 / minute Pulse rhythm:   regular BP sitting:   146 / 80  (right arm) Cuff size:   regular  Vitals Entered By: June McMurray CMA Duncan Dull) (November 04, 2009 9:10 AM)  Physical Exam  General:  Well developed, well nourished, no acute distress.healthy appearing.  I cannot appreciate stigmata of chronic liver disease. Head:  Normocephalic and atraumatic. Eyes:  PERRLA, no icterus.exam deferred to patient's ophthalmologist.   Abdomen:  Soft, nontender and nondistended. No masses, hepatosplenomegaly or hernias noted. Normal bowel sounds. Psych:  Alert and cooperative. Normal mood and affect.   Impression & Recommendations:  Problem # 1:  OTHER CHRONIC NONALCOHOLIC LIVER DISEASE (ICD-571.8) Assessment Unchanged Discussion today with Dr.Tafeen and he will seek alternative treatment for her psoriasis. If this does not work, we will restart low-dose methotrexate with liver biopsy in 6 months to one year depending on her clinical course and medications. I've given the patient a copy of her liver biopsy to supply to Dr. Alden Server in primary care. Also I have asked her to try to slowly lose weight her BMI of 32. She certainly is not morbidly obese, but would benefit from a Weight Watcher's dietary program and a supervised gradual  aerobic exercise program. Her biopsy does not show evidence of cirrhosis but does show some early portal fibrosis.  Problem # 2:  NONSPEC ELEVATION OF LEVELS OF TRANSAMINASE/LDH (ICD-790.4) Assessment: Unchanged Her liver enzymes have been up and down and have been unreliable in terms of determining any activity of her chronic liver disease.  Problem # 3:  PSORIASIS (ICD-696.1) Assessment: Improved per above discussion with dermatology.  Patient Instructions: 1)  Copy sent to : Rudi Heap, MD and Dr. Janalyn Harder in dermatology. 2)  Please continue current medications.  3)  The medication list was reviewed and reconciled.  All changed / newly prescribed medications were explained.  A complete medication list was provided to the patient / caregiver. 4)  hold methotrexate and followup with dermatology. Further GI followup will depend on her clinical course in terms of treatment of her psoriasis.

## 2010-03-24 NOTE — Progress Notes (Signed)
Summary: liver biopsy  Phone Note Call from Patient Call back at [phone]   Caller: Patient Call For: Dr. Jarold Motto Reason for Call: Talk to Nurse Summary of Call: would like to discuss liver biopsy that has been recommended to pt Initial call taken by: Vallarie Mare,  April 26, 2009 10:47 AM  Follow-up for Phone Call        Pt states she had labs done last week at Dr. Kathi Der.  Dr. Christell Constant told pt that liver functions were normal.  A report was faxed to Dr. Jarold Motto.  Pt asks that Dr. Jarold Motto review labs and see if he still feels that a liver biopsy is needed.   Follow-up by: Ashok Cordia RN,  April 26, 2009 11:37 AM  Additional Follow-up for Phone Call Additional follow up Details #1::        Pt asks that Dr. Jarold Motto review labs drawn at [facility] practice on 03/29/09.   After reviewing these does pt still need to proceed with the liver biopsy? Additional Follow-up by: Ashok Cordia RN,  April 27, 2009 1:39 PM    Additional Follow-up for Phone Call Additional follow up Details #2::    yes Follow-up by: Mardella Layman MD [facility],  April 27, 2009 3:42 PM  Additional Follow-up for Phone Call Additional follow up Details #3:: Details for Additional Follow-up Action Taken: Pt notified.  Pt will call back when ready to sch liver bx. Additional Follow-up by: Ashok Cordia RN,  April 28, 2009 9:21 AM

## 2010-03-24 NOTE — Letter (Signed)
Summary: [facility]  [facility]   Imported By: Sherian Rein 01/19/2009 09:52:50  _____________________________________________________________________  External Attachment:    Type:   Image     Comment:   External Document

## 2010-03-24 NOTE — Progress Notes (Signed)
Summary: Chart ordered-but its at [address]  Phone Note From Other Clinic Call back at [phone]   Caller: Clearance Coots for her Dermatologist Call For: DR PATTERSON Summary of Call: Regarding her methotraxate use-Does she need liver biopsy or is it being waived? Says they sent a letter in January and they have not heard back from Dr Jarold Motto. Would like c-b today please. Adviced Dr not in all week-still would like to hear back something. Initial call taken by: Leanor Kail Brand [facility],  April 13, 2008 10:31 AM  Follow-up for Phone Call        I spoke with Royden Purl, PA she states a letter was mailed to Dr Jarold Motto about 1 month ago questioning if Dr Jarold Motto would recommend a liver bx based on patient being treated for psoriasis with methotrexate.  Patient  has had a cummulative dose of 2.5 gm, (current dermatology guidelines recommend liver bx after 1-1.5 GM cummulative dose) they are asking for your opinion:Does patient need liver bx, or is it ok to defer?.  I advised PA that Dr Jarold Motto is out of the office this week, and I see no copy of the letter in the patient 's chart.  She is going to refax the letter to my attention.  I advised her I would have Dr Jarold Motto review it and we will get an answer to them asap, when he returns next week.   Follow-up by: Darcey Nora RN,  April 13, 2008 11:09 AM  Additional Follow-up for Phone Call Additional follow up Details #1::        letter was faxed to our office and is on your desk to be signed. Additional Follow-up by: Harlow Mares CMA,  April 15, 2008 9:12 AM    Additional Follow-up for Phone Call Additional follow up Details #2::    Not recommended anymore . Follow-up by: Mardella Layman MD [facility],  April 19, 2008 1:51 PM  Additional Follow-up for Phone Call Additional follow up Details #3:: Details for Additional Follow-up Action Taken: office is closed for lunch, will call back later.  Harlow Mares CMA,  April 20, 2008  1:22 PM  called Derm office back and spoke with  Helmut Muster and advised of Dr. Jarold Motto advice and that they should do plan #4 on their office note he reviewed as long as LFTS remain normal no need for liver BX ok to restart MTX. Additional Follow-up by: Harlow Mares CMA,  April 21, 2008 11:12 AM

## 2010-03-24 NOTE — Procedures (Signed)
Summary: Colon   Colonoscopy  Procedure date:  06/05/2007  Findings:      Location:  [facility].   Patient Name: Pamela, [name]. MRN: [id number] Procedure Procedures: Colonoscopy CPT: [phone].  Personnel: Endoscopist: Vania Rea. Jarold Motto, MD.  Exam Location: Exam performed in Outpatient Clinic. Outpatient  Patient Consent: Procedure, Alternatives, Risks and Benefits discussed, consent obtained, from patient. Consent was obtained by the RN.  Indications Symptoms: Diarrhea Abdominal pain / bloating. Change in bowel habits.  History  Current Medications: Patient is taking an non-steroidal medication. Patient is not currently taking Coumadin.  Medical/ Surgical History: Atrial Fibrillation, Hypothyroidism, Hypertension, Hyperlipidemia, Psoriasis, MTX. Rx. Anxiety Disorder, Reflux Disease, Irritable Bowel Syndrome,  Allergies: Allergic to MULTIPLE.  Comments: Prior rectocoele surgery... Pre-Exam Physical: Performed Jun 05, 2007. Entire physical exam was normal.  Comments: Pt. history reviewed/updated, physical exam performed prior to initiation of sedation? yes Exam Exam: Extent of exam reached: Cecum, extent intended: Cecum.  The cecum was identified by appendiceal orifice and IC valve. Patient position: on left side. Time to Cecum: 00:02:34. Time for Withdrawl: 00:06:32. Colon retroflexion performed. Images taken. ASA Classification: III. Tolerance: excellent.  Monitoring: Pulse and BP monitoring, Oximetry used. Supplemental O2 given. at 2 Liters.  Colon Prep Used Golytely for colon prep. Prep results: good.  Sedation Meds: Patient assessed and found to be appropriate for moderate (conscious) sedation. Sedation was managed by the Endoscopist. Fentanyl 75 mcg. given IV. Versed 6 mg. given IV.  Instrument(s): CF 140L. Serial [id number].  Findings - NORMAL EXAM: Cecum to Descending Colon. Not Seen: Polyps. AVM's. Colitis. Tumors. Crohn's.  -  DIVERTICULOSIS: Descending Colon to Sigmoid Colon. Not bleeding. ICD9: Diverticulosis, Colon: 562.10.  - NORMAL EXAM: Ileum. Biopsy/Normal Exam taken.  - NORMAL EXAM: Sigmoid Colon to Rectum.    Comments: Random biopsies done.... Assessment Normal examination.  Diagnoses: 562.10: Diverticulosis, Colon.   Comments: Severe IBS.Marland KitchenMarland KitchenR/O microscopic//collagenous colitis... Events  Unplanned Interventions: No intervention was required.  Plans Medication Plan: Continue current medications. Fiber supplements: Methylcellulose 1 Tbsp QAM, starting Jun 05, 2007 for indefinitely.   Patient Education: Patient given standard instructions for: Diverticulosis. Patient instructed to get routine colonoscopy every 5 years.  Disposition: After procedure patient sent to recovery. After recovery patient sent home.  Scheduling/Referral: Follow-Up prn. Await pathology to schedule patient.    cc: Rudi Heap, MD       Lewayne Bunting, MD     Girard Cooter, MD  This report was created from the original endoscopy report, which was reviewed and signed by the above listed endoscopist.

## 2010-03-24 NOTE — Progress Notes (Signed)
Summary: RECEIVE A CALL FROM ON CALL MD LAST NIGHT  Phone Note Call from Patient Call back at Home Phone [phone] Call back at [phone]   Caller: Patient Reason for Call: Talk to Nurse Summary of Call: RECIEVE A CALL LAST NIGHT BY THE ON-CALL MD. PT DOESN'T REMEBER WHAT HE SAID. PT HAS ON A HOLTER MONITOR.  Initial call taken by: Lorne Skeens,  August 10, 2008 12:05 PM  Follow-up for Phone Call        Return phone call to patient.  Pt felt okay. Will f/u with Dr Ladona Ridgel this week Dennis Bast, RN, BSN  August 10, 2008 6:13 PM

## 2010-03-24 NOTE — Miscellaneous (Signed)
  Clinical Lists Changes  Observations: Added new observation of MAGNET RTE: BOL 100 ERI 85 (08/20/2008 9:17) Added new observation of PPMLEADSTAT2: active (08/20/2008 9:17) Added new observation of PPMLEADSER2: [id number] (08/20/2008 9:17) Added new observation of PPMLEADMOD2: [id number] (08/20/2008 9:17) Added new observation of PPMLEADDOI2: 08/19/2008 (08/20/2008 9:17) Added new observation of PPMLEADLOC2: RV (08/20/2008 9:17) Added new observation of PPMLEADSTAT1: active (08/20/2008 9:17) Added new observation of PPMLEADSER1: [id number] (08/20/2008 9:17) Added new observation of PPMLEADMOD1: [id number] (08/20/2008 9:17) Added new observation of PPMLEADDOI1: 08/19/2008 (08/20/2008 9:17) Added new observation of PPMLEADLOC1: RA (08/20/2008 9:17) Added new observation of PPM IMP MD: Lewayne Bunting, MD (08/20/2008 9:17) Added new observation of PPM DOI: 08/19/2008 (08/20/2008 9:17) Added new observation of PPM SERL#: [id number]  (08/20/2008 9:17) Added new observation of PPM MODL#: [id number]  (08/20/2008 9:51) Added new observation of PACEMAKERMFG: St Jude  (08/20/2008 9:17) Added new observation of PACEMAKER MD: Lewayne Bunting, MD  (08/20/2008 9:17)      PPM Specifications Following MD:  Lewayne Bunting, MD     PPM Vendor:  [employer] Jude     PPM Model Number:  [id number]     PPM Serial Number:  [id number] PPM DOI:  08/19/2008     PPM Implanting MD:  Lewayne Bunting, MD  Lead 1    Location: RA     DOI: 08/19/2008     Model #: [id number]     Serial #: [id number]     Status: active Lead 2    Location: RV     DOI: 08/19/2008     Model #: [id number]     Serial #: [id number]     Status: active  Magnet Response Rate:  BOL 100 ERI 85

## 2010-03-24 NOTE — Assessment & Plan Note (Signed)
Summary: pc2   Visit Type:  Follow-up Referring Provider:  Dr Jorja Loa Primary Provider:  Rudi Heap, MD   History of Present Illness: [name] returns today for followup.  She is a pleasant 66 yo woman with a h/o HTN and PAF and symptomatic bradycardia with long pauses who is s/p PPM.  Since her PPM was placed she has been fairly asymptomatic except that she has trouble with her blood pressure.  She was started on Bystolic from metoprolol.  Her systolic  blood pressures have been improved.  She also notes some tingling in her legs and feet. No palpitations or syncope.  Current Medications (verified): 1)  Levoxyl 50 Mcg Tabs (Levothyroxine Sodium) .Marland Kitchen.. 1 By Mouth Once Daily 2)  Paxil 10 Mg Tabs (Paroxetine Hcl) .Marland Kitchen.. 1 By Mouth Once Daily 3)  Omeprazole 40 Mg Cpdr (Omeprazole) .... Take 1 Tablet By Mouth Once A Day 4)  Aspirin 81 Mg Tbec (Aspirin) .... Take One Tablet Two Times A Day 5)  Lorazepam 0.5 Mg Tabs (Lorazepam) .... As Needed 6)  Crestor 10 Mg Tabs (Rosuvastatin Calcium) .... Take 1 By Mouth Once Daily 7)  Methotrexate 2.5 Mg Tabs (Methotrexate Sodium) .... Take 2 By Mouth Every Week 8)  Torsemide 20 Mg Tabs (Torsemide) .... Take 1 Tablet By Mouth Once A Day 9)  Biestrogen 3.75 Mg .... Once Daily 10)  Lovaza 1 Gm Caps (Omega-3-Acid Ethyl Esters) .... 2 Capsulse Two Times A Day 11)  Multivitamins   Tabs (Multiple Vitamin) .... Vitamin Pack Daily 12)  Lutein 10 Mg Tabs (Lutein) .... Take One Tablet By Mouth Once Daily. 13)  Biotin Plus Other Minerals .... Once Daily 14)  Carvedilol 12.5 Mg Tabs (Carvedilol) .Marland Kitchen.. 1 1/2 in Am 2 in Pm 15)  Flecainide Acetate 100 Mg Tabs (Flecainide Acetate) .... Take One Tablet By Mouth Every 12 Hours  Allergies: 1)  ! Penicillin 2)  ! Codeine 3)  ! Doxycycline  Past History:  Past Medical History: Last updated: 06/10/2008 EDEMA (ICD-782.3) PREMATURE VENTRICULAR CONTRACTIONS (ICD-427.69) PSORIASIS (ICD-696.1) DIVERTICULOSIS, COLON, HX  OF (ICD-V12.79) IRRITABLE BOWEL SYNDROME (ICD-564.1) ATRIAL FIBRILLATION (ICD-427.31) HYPOTHYROIDISM (ICD-244.9) HYPERTENSION (ICD-401.9)  Past Surgical History: Last updated: 03/09/2009 Pacemaker Placement Hysterectomy/Bladder tack/repair of rectocele  Review of Systems  The patient denies chest pain, syncope, dyspnea on exertion, and peripheral edema.    Vital Signs:  Patient profile:   65 year old female Height:      64 inches Weight:      181 pounds BMI:     31.18 Pulse rate:   58 / minute BP sitting:   142 / 74  (right arm)  Vitals Entered By: Laurance Flatten CMA (May 06, 2009 3:51 PM)  Physical Exam  General:  Well developed, well nourished, no acute distress.healthy appearing.   Head:  Normocephalic and atraumatic. Eyes:  PERRLA, no icterus. Mouth:  Teeth, gums and palate normal. Oral mucosa normal. Neck:  Supple; no masses or thyromegaly. Chest Wall:  Well healed pm incision. Lungs:  Clear throughout to auscultation. Heart:  Regular rate and rhythm; no murmurs, rubs,  or bruits. Abdomen:  Soft, nontender and nondistended. No masses, hepatosplenomegaly or hernias noted. Normal bowel sounds.obese.   Msk:  Symmetrical with no gross deformities. Normal posture. Pulses:  Normal pulses noted. Extremities:  No clubbing, cyanosis, edema or deformities noted.trace pedal edema.   Neurologic:  Alert and  oriented x4;  grossly normal neurologically.   PPM Specifications Following MD:  Lewayne Bunting, MD  PPM Vendor:  [employer] Jude     PPM Model Number:  [id number]     PPM Serial Number:  [id number] PPM DOI:  08/19/2008     PPM Implanting MD:  Lewayne Bunting, MD  Lead 1    Location: RA     DOI: 08/19/2008     Model #: [id number]     Serial #: [id number]     Status: active Lead 2    Location: RV     DOI: 08/19/2008     Model #: [id number]     Serial #: [id number]     Status: active  Magnet Response Rate:  BOL 100 ERI 85    PPM Follow Up Remote Check?  No Battery Voltage:  2.99 V     Battery  Est. Longevity:  10.7 years     Pacer Dependent:  No       PPM Device Measurements Atrium  Amplitude: 5 mV, Impedance: 410 ohms, Threshold: 0.875 V at 0.4 msec Right Ventricle  Amplitude: 5.6 mV, Impedance: 480 ohms, Threshold: 2.25 V at 0.4 msec  Episodes MS Episodes:  74     Percent Mode Switch:  1%     Coumadin:  No Atrial Pacing:  5.3%     Ventricular Pacing:  <1%  Parameters Mode:  DDD     Lower Rate Limit:  50     Upper Rate Limit:  100 Paced AV Delay:  200     Sensed AV Delay:  150 Next Cardiology Appt Due:  10/21/2009 Tech Comments:  No parameter changes.  Device function normal.  Longest mode switch 2:38 hours, - coumadin.  Altha Harm, LPN  May 06, 2009 4:06 PM  MD Comments:  Agree with above.  Impression & Recommendations:  Problem # 1:  CARDIAC PACEMAKER IN SITU (ICD-V45.01) Her device is working normally.   Will recheck in several months.  Problem # 2:  PREMATURE VENTRICULAR CONTRACTIONS (ICD-427.69) Her symptoms are much improved. Her updated medication list for this problem includes:    Aspirin 81 Mg Tbec (Aspirin) .Marland Kitchen... Take one tablet two times a day    Carvedilol 12.5 Mg Tabs (Carvedilol) .Marland Kitchen... 1 1/2 in am 2 in pm    Flecainide Acetate 100 Mg Tabs (Flecainide acetate) .Marland Kitchen... Take one tablet by mouth every 12 hours  Problem # 3:  ATRIAL FIBRILLATION (ICD-427.31) She is in rhythm  almost 99% of the time. Her updated medication list for this problem includes:    Aspirin 81 Mg Tbec (Aspirin) .Marland Kitchen... Take one tablet two times a day    Carvedilol 12.5 Mg Tabs (Carvedilol) .Marland Kitchen... 1 1/2 in am 2 in pm    Flecainide Acetate 100 Mg Tabs (Flecainide acetate) .Marland Kitchen... Take one tablet by mouth every 12 hours  Problem # 4:  HYPERTENSION (ICD-401.9) Her blood pressure is improved.  will continue her low sodium diet and her current medical therapy. Her updated medication list for this problem includes:    Aspirin 81 Mg Tbec (Aspirin) .Marland Kitchen... Take one tablet two times a day     Torsemide 20 Mg Tabs (Torsemide) .Marland Kitchen... Take 1 tablet by mouth once a day    Carvedilol 12.5 Mg Tabs (Carvedilol) .Marland Kitchen... 1 1/2 in am 2 in pm  Patient Instructions: 1)  Your physician recommends that you schedule a follow-up appointment in: 6 months with device and 12 months with Dr Ladona Ridgel

## 2010-03-24 NOTE — Progress Notes (Signed)
Summary: QUESTION REGARDING HIS PACEMAKER  Phone Note Call from Patient Call back at Home Phone [phone]   Caller: Patient Reason for Call: Talk to Nurse Summary of Call: HAS QUESTION REGARDING HER PACEMAKER, NO DETAILS WAS GIVEN  Initial call taken by: Lorne Skeens,  August 26, 2008 9:28 AM  Follow-up for Phone Call        Spoke with patient and assured her it was ok to shower at this point and not to lift more than 10 lbs. with the left arm and keep her scheduled wound check visit next week. Follow-up by: Altha Harm, LPN,  August 27, 979 1:50 PM

## 2010-03-24 NOTE — Progress Notes (Signed)
Summary: pt wants to have procedure earlier  Phone Note Call from Patient Call back at [phone]   Reason for Call: Talk to Nurse Summary of Call: pt returning your call and she wants to set up procedure earlier Initial call taken by: Omer Jack,  August 18, 2008 8:42 AM  Follow-up for Phone Call        will have pacer implanted on 6/30/1o0 Dennis Bast, RN, BSN  August 18, 2008 12:49 PM

## 2010-03-24 NOTE — Progress Notes (Signed)
Summary: Liver Biopsy  Phone Note Call from Patient Call back at Home Phone [phone]   Caller: Patient Call For: Dr. Jarold Motto Reason for Call: Talk to Nurse Summary of Call: Calling about Liver Biopsy Initial call taken by: Karna Christmas,  September 03, 2009 1:57 PM  Follow-up for Phone Call        Pt has not heard from radilolgy re liver bx.  She did not want to do it until August.  Pt will call back if she has not heard anything by first of Aug.  States she is having a problem with her back at present. Follow-up by: Ashok Cordia RN,  September 03, 2009 3:00 PM

## 2010-03-24 NOTE — Progress Notes (Signed)
Summary: Ultrasound results  Phone Note Call from Patient Call back at Home Phone [phone] Call back at [phone]   Call For: DR PATTERSON Reason for Call: Lab or Test Results Summary of Call: Ultrasound & labwork Initial call taken by: Leanor Kail [facility],  March 22, 2009 10:37 AM  Follow-up for Phone Call        Pt has return OV to discuss results.  Pt notified. Follow-up by: Ashok Cordia RN,  March 22, 2009 10:48 AM

## 2010-03-24 NOTE — Progress Notes (Signed)
Summary: rx needed  Phone Note Refill Request Call back at [phone] cell Message from:  Patient  [facility] [place] [place]   needs new rx for metoprolol er    Method Requested: Electronic Initial call taken by: Burnard Leigh,  November 26, 2008 11:11 AM    New/Updated Medications: METOPROLOL SUCCINATE 25 MG XR24H-TAB (METOPROLOL SUCCINATE) Take one tablet once daily Prescriptions: METOPROLOL SUCCINATE 25 MG XR24H-TAB (METOPROLOL SUCCINATE) Take one tablet once daily  #30 x 6   Entered by:   Judithe Modest CMA   Authorized by:   Laren Boom, MD, [facility]   Signed by:   Judithe Modest CMA on 11/27/2008   Method used:   [facility] to        [facility] [address] (retail)       [address]       [place], [place]  [address]       Ph: [phone] or [phone]       Fax: [phone]   RxID:   [id number]

## 2010-03-24 NOTE — Progress Notes (Signed)
Summary: premedication?  Phone Note Call from Patient Call back at [phone]   Caller: Dr. Hilma Favors   (dentist) Summary of Call: Does the patient need to be premedicated prior to dental work.  Patient has an appointment this am. Initial call taken by: Burnard Leigh,  December 28, 2008 8:51 AM  Follow-up for Phone Call        S/W Dr. Barbette Hair office and advised that the pt does not need pre-meds for post device placement. I do not see any evidence of valve or stent placement. Ask her to make sure with the pt and if the answer is NO then she will not need pre-medication. Follow-up by: Duncan Dull, RN, BSN,  December 28, 2008 9:12 AM

## 2010-03-24 NOTE — Cardiovascular Report (Signed)
Summary: Office Visit   Office Visit   Imported By: Roderic Ovens 03/18/2009 14:08:43  _____________________________________________________________________  External Attachment:    Type:   Image     Comment:   External Document

## 2010-03-24 NOTE — Progress Notes (Signed)
Summary: B/P RUNNING HIGH  Phone Note Call from Patient Call back at [facility] Phone [phone]   Caller: Patient Summary of Call: PT B/P HIGH 203/99 LAST NIGHT THIS MORNING  168/99 Initial call taken by: Judie Grieve,  March 11, 2009 8:35 AM  Follow-up for Phone Call        Called pt back and she states her BP has been running high Dr Kathi Der office has increased her Torsemide to 20mg  daily over a week ago.  186/99 before her medications.  I let her know she should follow up with Dr Buel Ream office because he is following her for her BP.  She will call me back if there are any problems. Dennis Bast, RN, BSN  March 11, 2009 8:56 AM

## 2010-03-24 NOTE — Progress Notes (Signed)
Summary: Cardiology Phone Note  Phone Note Call from Patient Call back at Home Phone [phone]   Caller: Patient Summary of Call: Pt called stating that she's been back in A. fib throughout the day today.  She has had intermittent dizziness and is questioning if this could be related to Flecainide which she was recetnly started on and subsequently titrated to 100mg  two times a day following nl GXT last week.  I advised that I suspect her A. Fib may be driving her Ss, although dizziness is common among pts taking Flecainide.  She is due to take her evening Flecainide @ 11p and I asked her to take it now.  She's already taken an additional Metoprolol today.  She will call back or present to the ER if her a. fib does not break and Ss continue. Initial call taken by: Creig Hines, ANP-BC,  Jul 19, 2008 8:51 PM

## 2010-03-24 NOTE — Progress Notes (Signed)
Summary: refill  Phone Note Refill Request Call back at [phone] Message from:  Patient on April 02, 2009 2:19 PM  Refills Requested: Medication #1:  CARVEDILOL 12.5 MG TABS Take two tablet by mouth twice a day   [facility] [place]   Method Requested: Fax to Local Pharmacy Initial call taken by: Migdalia Dk,  April 02, 2009 2:19 PM  Follow-up for Phone Call        Rx faxed to pharmacy Follow-up by: Oswald Hillock,  April 02, 2009 3:22 PM    Prescriptions: CARVEDILOL 12.5 MG TABS (CARVEDILOL) Take one tablet by mouth twice a day  #60 x 3   Entered by:   Oswald Hillock   Authorized by:   Laren Boom, MD, [facility]   Signed by:   Oswald Hillock on 04/02/2009   Method used:   Electronically to        [facility] [address] (retail)       [address]       [place], [place]  [address]       Ph: [phone] or [phone]       Fax: [phone]   RxID:   [id number]

## 2010-03-28 ENCOUNTER — Encounter: Payer: Self-pay | Admitting: Internal Medicine

## 2010-04-01 ENCOUNTER — Encounter (INDEPENDENT_AMBULATORY_CARE_PROVIDER_SITE_OTHER): Payer: Self-pay | Admitting: *Deleted

## 2010-04-07 NOTE — Letter (Signed)
Summary: Remote Device Check  [facility], Main Office  [address]   [place], [place] [address]   Phone: [phone]  Fax: [phone]     April 01, 2010 MRN: [id number]   [facility] [address] [place], [place]  [address]   Dear Ms. Pheasant,   Your remote transmission was recieved and reviewed by your physician.  All diagnostics were within normal limits for you.  __X___Your next transmission is scheduled for:  06/23/10.   Please transmit at any time this day.  If you have a wireless device your transmission will be sent automatically.  ______Your next office visit is scheduled for:                              . Please call our office to schedule an appointment.    Sincerely,  Altha Harm, LPN

## 2010-04-13 ENCOUNTER — Telehealth: Payer: Self-pay | Admitting: Internal Medicine

## 2010-04-13 NOTE — Cardiovascular Report (Signed)
Summary: Office Visit Remote   Office Visit Remote   Imported By: Roderic Ovens 04/04/2010 10:05:23  _____________________________________________________________________  External Attachment:    Type:   Image     Comment:   External Document

## 2010-04-19 NOTE — Progress Notes (Signed)
Summary: question re meds  Phone Note Call from Patient Call back at Home Phone [phone]   Caller: Patient Reason for Call: Talk to Nurse Summary of Call: pt has question re blood pressure meds. pt states she been light headed for a couple weeks.  Initial call taken by: Roe Coombs,  April 13, 2010 9:53 AM  Follow-up for Phone Call        spoke with pt she is going to decrease her Carvedilo l12.5mg  to 1po in the am and 2 in the pm and do this for a week and call me and let me know if it helps with her dizziness.  She has been on a diet and lost 18lbs.  Thinks she may be on too much medication Dennis Bast, RN, BSN  April 13, 2010 11:09 AM    New/Updated Medications: CARVEDILOL 12.5 MG TABS (CARVEDILOL) 1  in AM 2 in PM or as directed

## 2010-05-06 LAB — CBC
HCT: 38.4 % (ref 36.0–46.0)
Hemoglobin: 13.1 g/dL (ref 12.0–15.0)
MCH: 30.4 pg (ref 26.0–34.0)
MCHC: 34.1 g/dL (ref 30.0–36.0)
MCV: 89.1 fL (ref 78.0–100.0)
Platelets: 266 10*3/uL (ref 150–400)
RBC: 4.31 MIL/uL (ref 3.87–5.11)
RDW: 13.6 % (ref 11.5–15.5)
WBC: 8.8 10*3/uL (ref 4.0–10.5)

## 2010-05-06 LAB — PROTIME-INR
INR: 0.97 (ref 0.00–1.49)
Prothrombin Time: 13.1 seconds (ref 11.6–15.2)

## 2010-05-06 LAB — APTT: aPTT: 32 seconds (ref 24–37)

## 2010-05-30 LAB — CBC
HCT: 38.1 % (ref 36.0–46.0)
Hemoglobin: 12.6 g/dL (ref 12.0–15.0)
MCHC: 33.2 g/dL (ref 30.0–36.0)
MCV: 92.4 fL (ref 78.0–100.0)
Platelets: 341 10*3/uL (ref 150–400)
RBC: 4.13 MIL/uL (ref 3.87–5.11)
RDW: 13.3 % (ref 11.5–15.5)
WBC: 8.2 10*3/uL (ref 4.0–10.5)

## 2010-05-30 LAB — POCT I-STAT, CHEM 8
BUN: 14 mg/dL (ref 6–23)
Calcium, Ion: 1.17 mmol/L (ref 1.12–1.32)
Chloride: 103 mEq/L (ref 96–112)
Creatinine, Ser: 0.8 mg/dL (ref 0.4–1.2)
Glucose, Bld: 98 mg/dL (ref 70–99)
HCT: 41 % (ref 36.0–46.0)
Hemoglobin: 13.9 g/dL (ref 12.0–15.0)
Potassium: 4.6 mEq/L (ref 3.5–5.1)
Sodium: 141 mEq/L (ref 135–145)
TCO2: 29 mmol/L (ref 0–100)

## 2010-05-30 LAB — BASIC METABOLIC PANEL
BUN: 12 mg/dL (ref 6–23)
CO2: 28 mEq/L (ref 19–32)
Calcium: 9.2 mg/dL (ref 8.4–10.5)
Chloride: 100 mEq/L (ref 96–112)
Creatinine, Ser: 0.74 mg/dL (ref 0.4–1.2)
GFR calc Af Amer: >60 mL/min (ref >60)
GFR calc non Af Amer: >60 mL/min (ref >60)
Glucose, Bld: 117 mg/dL — ABNORMAL HIGH (ref 70–99)
Potassium: 4.3 mEq/L (ref 3.5–5.1)
Sodium: 136 mEq/L (ref 135–145)

## 2010-05-30 LAB — POCT CARDIAC MARKERS
CKMB, poc: <1 ng/mL — ABNORMAL LOW (ref 1.0–8.0)
Myoglobin, poc: 88.1 ng/mL (ref 12–200)
Troponin i, poc: <0.05 ng/mL (ref 0.00–0.09)

## 2010-05-30 LAB — PROTIME-INR
INR: 1 (ref 0.00–1.49)
Prothrombin Time: 13.3 seconds (ref 11.6–15.2)

## 2010-05-30 LAB — APTT: aPTT: 30 seconds (ref 24–37)

## 2010-05-31 ENCOUNTER — Telehealth: Payer: Self-pay | Admitting: Internal Medicine

## 2010-06-22 ENCOUNTER — Encounter: Payer: Self-pay | Admitting: Family Medicine

## 2010-06-23 ENCOUNTER — Ambulatory Visit (INDEPENDENT_AMBULATORY_CARE_PROVIDER_SITE_OTHER): Payer: BC Managed Care – PPO | Admitting: *Deleted

## 2010-06-23 DIAGNOSIS — I498 Other specified cardiac arrhythmias: Secondary | ICD-10-CM

## 2010-06-23 DIAGNOSIS — I4891 Unspecified atrial fibrillation: Secondary | ICD-10-CM

## 2010-06-23 DIAGNOSIS — Z950 Presence of cardiac pacemaker: Secondary | ICD-10-CM

## 2010-06-26 ENCOUNTER — Other Ambulatory Visit: Payer: Self-pay | Admitting: Internal Medicine

## 2010-06-30 NOTE — Progress Notes (Signed)
Pacer remote check  

## 2010-07-05 NOTE — Assessment & Plan Note (Signed)
[facility]                         GASTROENTEROLOGY OFFICE NOTE   [name], [name]                        MRN:          [id number]  DATE:05/23/2007                            DOB:          1945/11/09    [name] is a 65 year old complex white female with many medical  problems referred today for evaluation of abdominal pain and cramping  with gas and bloating and irregular bowel habits.   I have known [name] for last 10-15 years and she has chronic  irritable bowel syndrome with numerous extensive GI evaluations in the  past, which have been fairly unremarkable as she has been diagnosed as  having hemorrhoidal bleeding and irritable bowel syndrome.  I have not  seen her in several years and she apparently has been doing well with  her chronic constipation and she does have known diverticulosis.  She  had been on Protonix 40 mg a day for many years because of acid reflux  and everything has been well-controlled until she had to stop her  Protonix for insurance purposes and has gone on over-the-counter  omeprazole, has had return of her reflux symptoms, dyspepsia, crampy  lower abdominal pain and what she describes as nonbloody diarrhea.  She  had been on Weight Watchers and lost 30 pounds voluntarily over the last  6 months.  She denies the use of sorbitol, fructose or any history of  lactose intolerance.  She really denies any hepatobiliary complaints or  systemic complaints such as fever or chills.  She has not had a  gallbladder examination in several years.  She denies abuse of alcohol,  cigarettes, or NSAIDS.  She said she is having no rectal bleeding at  this time.   PAST MEDICAL HISTORY:  Her past medical history is involved and revolves  around atrial fibrillation, psoriasis, hypertension,  hypercholesterolemia, chronic thyroid dysfunction, chronic anxiety  syndrome, she has had a previous hysterectomy.   She currently is on:  1. Levoxyl 50 mcg a day.  2. Aspirin 81 mg a day.  3. Methotrexate 2.5 mg every other month because of psoriasis.  4. Paroxetine 10 mg a day.  5. Estrogen 3.75 mg a day.  6. Lutein 20 mg a day.  7. Calcium 1200 mg a day.  8. Torsemide 10 mg a day with p.r.n. lorazepam use.   In the past, she has had reactions to PENICILLIN, CODEINE, SULFUR and  LEVAQUIN.   SURGICAL HISTORY:  1. The patient has had a history of rectocele surgery.  2. Bladder tacking.  3. Hysterectomy.   FAMILY HISTORY:  Remarkable for sisters with ovarian and breast cancer  and multiple family members with diabetes and atherosclerotic  cardiovascular disease.   SOCIAL HISTORY:  She is married and lives with her husband.  She has  some college education, works as Environmental health practitioner.  She does not  smoke or abuse ethanol.   REVIEW OF SYSTEMS:  Positive for continued arrhythmia problems and  apparently there is some consideration to her possibly being  anticoagulated with Coumadin, although I cannot see where she has ever  had a stroke or evidence of embolization.  Last clinic note I have from  Dr. Lewayne Bunting was September 2008.  Review of systems otherwise  noncontributory.   PHYSICAL EXAMINATION:  She is an attractive, healthy-appearing white  female appearing younger than her stated age.  She is 5 feet 4 inches  and weighs 162 pounds.  Blood pressure is 130/70 and pulse was 64 and  regular.  I could not appreciate stigmata of chronic liver disease or  thyromegaly.  CHEST:  Clear.  HEART:  She was in irregular rhythm without murmurs, gallops or rubs.  ABDOMEN:  Essentially unremarkable without hepatosplenomegaly, abdominal  masses or significant tenderness.  EXTREMITIES:  Peripheral extremities showed no edema or phlebitis.  Mental status was clear.   ASSESSMENT:  1. Well-controlled gastroesophageal reflux disease on proton pump      inhibitor therapy, although this has recently been changed and  all      of her irritable bowel syndrome and upper gastrointestinal symptoms      have returned.  2. Patient is due for screening follow up colonoscopy.  3. Rule out possible bacterial overgrowth syndrome.  4. Chronic thyroid dysfunction.  5. Psoriasis on methotrexate.  6. Atrial fibrillation and hypertensive cardiovascular disease.  7. Multiple drug allergies including PENICILLIN and SULFUR.   RECOMMENDATIONS:  1. Restart Protonix 40 mg a day in place of omeprazole.  2. Standard antireflux maneuvers.  3. Follow up colonoscopy exam.  4. Screening laboratory parameters.  5. Continue other medications above per her multiple physicians.     Vania Rea. Jarold Motto, MD, [facility], FAGA  Electronically Signed    DRP/MedQ  DD: 05/23/2007  DT: 05/23/2007  Job #: [id number]   cc:   Ernestina Penna, M.D.  Doylene Canning. Ladona Ridgel, MD  [facility]

## 2010-07-05 NOTE — Assessment & Plan Note (Signed)
[facility]                            CARDIOLOGY OFFICE NOTE   [name], [name]                          MRN:          [id number]  DATE:09/19/2006                            DOB:          06-Mar-1945    I received a call from Dr. Vernon Prey.  He had just seen [name], and  apparently she had an episode of recent presyncope.  His description of  the event was worrisome.  In reviewing the chart, she has had atrial  fibrillation controlled on medical therapy.  However, Dr. Jenene Slicker  note also reflects a prior history of nonsustained ventricular  tachycardia, although her left ventricular function apparently has been  reasonably good.  Echocardiogram done in June 2008 revealed overall  normal systolic function, ejection fraction 55%, and a calcified aortic  valve with some mitral regurgitation.  She also has a myocardial  perfusion imaging study from October 2007.  This revealed a normal  stress nuclear study.  Dr. Christell Constant was concerned that she be seen fairly  promptly.  She was noted, apparently, on January 31 to have a wide-  complex tachycardia with a rate of about 178 and they tried to get her  to the hospital.  Based on the history, and the information in Dr.  Lubertha Basque previous evaluation, we will get her into the electrophysiology  clinic to be seen.     Arturo Morton. Riley Kill, MD, [facility]  Electronically Signed    TDS/MedQ  DD: 09/19/2006  DT: 09/19/2006  Job #: [id number]

## 2010-07-05 NOTE — Op Note (Signed)
[name], [name]                 ACCOUNT NO.:  [id number]   MEDICAL RECORD NO.:  [id number]          PATIENT TYPE:  INP   LOCATION:  3707                         FACILITY:  [facility]   PHYSICIAN:  [name]. Pamela Ridgel, MD    DATE OF BIRTH:  [birth year]   DATE OF PROCEDURE:  08/19/2008  DATE OF DISCHARGE:                               OPERATIVE REPORT   PROCEDURE PERFORMED:  Implantation of dual-chamber pacemaker.   INDICATIONS:  Symptomatic tachy-brady syndrome with pauses of up to 6  seconds and atrial fibrillation with rapid ventricular rates of up to  120 beats per minute.   INTRODUCTION:  The patient is a 65 year old woman with longstanding  paroxysmal AFib, which has increased in frequency and severity.  The  patient was placed on flecainide therapy to control her AFib, which has  done a fairly good job.  Unfortunately, she has developed recurrent  dizzy spells and under cardiac monitoring had pauses of up to 6 seconds.  She has had no frank syncope, but had several episodes where she was  felt like she might pass out.  She is now referred for permanent  pacemaker insertion.   PROCEDURE:  After informed consent was obtained, the patient was taken  to the diagnostic EP Lab in a fasting state.  After usual preparation,  draping, intravenous fentanyl and midazolam was given for sedation.  Lidocaine 30 mL was infiltrated into the left infraclavicular region.  A  5-cm incision was carried out over this region.  Electrocautery was  utilized to dissect down the fascial plane.  The left subclavian vein  was then punctured x2 and the St. Jude model 2088T 52-cm active fixation  pacing lead serial number [id number] was advanced into the right ventricle  and the [employer]. Jude model 2088T 46-cm active fixation pacing lead serial  number [id number] was advanced to right atrium.  Mapping was carried out  initially in the right ventricle and at the final site.  On the RV  apical septum, the R-waves vary  between 6 and 10 mV, pacing impedance  was 583 ohms, and threshold was 0.9 volts at 0.5 milliseconds with the  lead actively fixed.  A 10-volt pacing did not stimulate the diaphragm.  There was a large injury current noted with active fixation of the lead.  It should be noted that mapping of multiple sites on the RV septum along  with the RV apex demonstrated very small R-waves perhaps secondary to  the patient's underlying flecainide use.  In the same way, mapping was  carried out in the right atrium with the atrial lead and on the final  site on the lateral wall of the right atrium, P-waves measured 4 mV, the  pacing impedance was 500 ohms, and the threshold was initially 1/2 volt  at 0.5 milliseconds.  A 10-volt pacing did not stimulate the diaphragm.  With the both atrial and ventricular leads in satisfactory position,  they were secured to the subpectoralis fascia with a figure-of-eight  silk suture.  The sewing sleeve was also secured with silk suture.  Electrocautery was utilized  to make subcutaneous pocket.  Gentamicin  irrigation was utilized to irrigate the pocket.  At this point, the  incision was closed with 2-0 Vicryl and 3-0 Vicryl.  Benzoin was painted  on the skin.  Steri-Strips were applied, the pressure dressing was  placed, and the patient was returned to her room in satisfactory  condition.   COMPLICATIONS:  There were no immediate procedure complications.   RESULTS:  Demonstrate successful implantation of a St. Jude dual-chamber  pacemaker in a patient with symptomatic tachy-brady syndrome and  paroxysmal AFib.      [name]. Pamela Ridgel, MD  Electronically Signed     GWT/MEDQ  D:  08/19/2008  T:  08/20/2008  Job:  [id number]   cc:   Ernestina Penna, M.D.

## 2010-07-05 NOTE — Assessment & Plan Note (Signed)
[facility]                            CARDIOLOGY OFFICE NOTE   KRYSTLE, POLCYN                        MRN:          [id number]  DATE:08/08/2006                            DOB:          1945-09-03    PRIMARY CARE PHYSICIAN:  Ernestina Penna, M.D.   REASON FOR REFERRAL:  Evaluate patient with atrial fibrillation.   HISTORY OF PRESENT ILLNESS:  The patient is 65 years old.  She had  atrial fibrillation again on Monday.  She saw Dr. Christell Constant this week and  reported this persistent tachycardia.  This is her usual symptom of  atrial fibrillation.  She said it lasted about all day.  She becomes  very fatigued with this.  She did not have increasing presyncope.  She  did not have any chest pain.  She has not had any new shortness of  breath.  She denies any PND or orthopnea.  She is tired all the time.  She does have increased lower extremity swelling since being put on  verapamil and wants to discontinue this medication.   The patient otherwise has no new symptoms.  She has seen Dr. Ladona Ridgel and  discussed these same issues twice this year but has not wanted to use  flecainide, which has been suggested.   PAST MEDICAL HISTORY:  1. Hypertension.  2. Hypothyroidism.  3. Atrial fibrillation.  4. Nonsustained ventricular tachycardia.  5. Irritable bowel syndrome.  6. Diverticulosis.  7. Psoriasis.  8. Rectocele surgery.  9. Hysterectomy.   ALLERGIES:  SULFA, PENICILLIN and CODEINE.   CURRENT MEDICATIONS:  1. Omega-3.  2. Phosphate.  3. Benefiber.  4. Calcium.  5. Verapamil 180 mg daily.  6. Torsemide 10 mg b.i.d.  7. Paxil 10 mg daily.  8. Methotrexate 8 tablets daily.  9. Aspirin 81 mg daily.  10.Estrogen.  11.Protonix 40 mg daily.  12.Lorazepam.  13.Metarix.  14.Crestor 5 mg daily.  15.Levothyroxine 50 mcg daily.   REVIEW OF SYSTEMS:  As stated in the HPI and otherwise negative for  other systems.   PHYSICAL EXAMINATION:  GENERAL:   The patient is in no distress.  VITAL SIGNS:  Blood pressure 160/80, heart rate 68 and regular, weight  189, body mass index 32.  HEENT:  Eyelids unremarkable.  Pupils equal, round, and reactive to  light.  Fundi not visualized.  Oral mucosa unremarkable.  NECK:  No jugular venous distention, waveform within normal limits,  carotid upstroke brisk and symmetric, no bruits, no thyromegaly.  LYMPHATIC:  No cervical, axillary or inguinal adenopathy.  LUNGS:  Clear to auscultation bilaterally.  BACK:  No costovertebral angle tenderness.  CHEST:  Unremarkable.  HEART:  PMI not displaced or sustained.  S1 and S2 within normal limits,  no S3, no S4, no clicks, no rubs, no murmurs.  ABDOMEN:  Mildly obese, positive bowel sounds, normal in frequency and  pitch, no bruits, no rebound, no guarding, no midline pulsatile mass, no  hepatomegaly, no splenomegaly.  SKIN:  No rashes, no nodules.  EXTREMITIES:  2+ pulses, no edema.  NEUROLOGIC:  Oriented to person,  place and time.  Cranial nerves II-XII  grossly intact.  Motor grossly intact.   EKG:  Sinus rhythm, rate 68, axis within normal limits, intervals within  normal limits, no acute ST-T wave changes.   ASSESSMENT AND PLAN:  1. Atrial fibrillation.  We again had a long discussion about this.      She wants to come off the verapamil.  She understands she needs to      e on either a calcium channel blocker or a beta blocker if she is      going to use flecainide.  I am going to restart the Toprol at 50 mg      daily.  She will stop the verapamil.  She is now disturbed enough      by these symptoms that she consents to a pill in pocket approach.      I think this is very reasonable as outlined in the atrial      fibrillation guidelines.  When she has her next episode of      tachyarrhythmia that is sustained, she will come to the emergency      room.  She would be given 300 mg of p.o. flecainide.  We can watch      her to see if she breaks and  to make sure she has no tachy or brady      response with this.  She could then use this approach going      forward.  She has a normal Cardiolite suggesting no structural      heart disease.  I am going to get an echocardiogram to further      evaluate this as it has been quite awhile and she has had the      edema.  She does not need anticoagulation as her [place] risk score is      very low.  She will use aspirin 81 mg.  2. Hypertension.  Blood pressure is slightly elevated.  However, this      can be followed since we are changing her medications.  3. Follow-up will be based on the future symptoms or the results of      the echo.  Otherwise, I will see her in about 6 months.     Rollene Rotunda, MD, [facility]  Electronically Signed    JH/MedQ  DD: 08/08/2006  DT: 08/08/2006  Job #: [id number]   cc:   Ernestina Penna, M.D.

## 2010-07-05 NOTE — Assessment & Plan Note (Signed)
[facility]                         ELECTROPHYSIOLOGY OFFICE NOTE   [name], [name]                        MRN:          [id number]  DATE:05/08/2007                            DOB:          1945-06-05    [name] returns today for followup.  She is a very pleasant middle-  aged woman with a history of paroxysmal A fib and hypertension and  psoriasis on phototherapy who returns today for followup.  In the last 6  months, she had some A fib but not any more than recently, though she  does note an episode occurring several days ago which lasted most of the  day.  She also notes that her Lopressor which she takes now twice a day  makes her feel tired and sleepy.  She has lost 25 pounds in the last 6  months on Weight Watchers.  She has had no syncope.  She complains of  bloating and increased gas.   CURRENT MEDICATIONS:  1. Crestor 10 mg a day.  2. Omeprazole 20 a day.  3. Multiple vitamins.  4. Torsemide 10 a day.  5. Metoprolol 25 twice a day.  6. Methotrexate 2.5 mg weekly.  7. Levoxyl 50 mcg daily.   PHYSICAL EXAMINATION:  GENERAL:  She is a pleasant, well-appearing  middle-aged woman in no distress.  VITAL SIGNS:  Blood pressure was 142/71, pulse 54 and regular,  respirations 18, weight was 165 pounds.  NECK:  No jugular distention.  LUNGS:  Clear bilaterally to auscultation.  No wheezes, rales, or  rhonchi present.  CARDIAC:  Regular, rate, and rhythm.  Normal S1 and S2.  EXTREMITIES:  No edema.   EKG today demonstrates sinus rhythm with normal axis and intervals.   IMPRESSION:  1. Paroxysmal atrial fibrillation.  2. Hypertension.  3. Dyslipidemia on Crestor.  4. Thromboembolic prevention with aspirin.   DISCUSSION:  Today, because of [name]'s fatigue on her metoprolol, I  am going to switch her from metoprolol 25 twice daily to atenolol once  daily 50 mg taken at night.  We will see her  back in the office in 6 months or  sooner should she have more or  worsening atrial fibrillation symptoms.     Doylene Canning. Ladona Ridgel, MD  Electronically Signed    GWT/MedQ  DD: 05/08/2007  DT: 05/09/2007  Job #: [id number]   cc:   Ernestina Penna, M.D.

## 2010-07-05 NOTE — Assessment & Plan Note (Signed)
[name]                         ELECTROPHYSIOLOGY OFFICE NOTE   [name], [name]                        MRN:          [id number]  DATE:12/20/2007                            DOB:          01/27/1946    [name] returns today for followup.  She is a very pleasant middle-  aged woman with history of hypertension and psoriasis who also has  paroxysmal atrial fibrillation.  She returns today for followup.  She  denies chest pain or shortness of breath.  She does note however that  her atrial fibrillation has increased slightly in frequency over the  last few months.  She had no other specific complaints today.   MEDICATIONS:  1. Levoxyl 50 mcg a day.  2. Aspirin 81 a day.  3. Paroxetine 10 a day.  4. Estrogen supplements.  5. Calcium supplement.  6. Torsemide 10 mg daily.  7. Crestor 10 a day.  8. Omeprazole 20 a day.  9. Toprol-XL 50 mg half tablet twice a day.  10.Folate 1 tablet daily.   PHYSICAL EXAMINATION:  GENERAL:  She is a pleasant well-appearing middle-  aged woman in no distress.  VITAL SIGNS:  Blood pressure was 122/82, the pulse 65 and regular,  respirations were 18.  NECK:  No jugular venous distention.  LUNGS:  Clear bilaterally to auscultation.  No wheezes, rales, or  rhonchi are present.  CARDIOVASCULAR:  Regular rate and rhythm.  Normal S1 and S2.  SKIN:  Multiple erythematous papules and the patient has recently had  biopsy of these.  The diagnosis is still unclear.   EKG several weeks ago rather demonstrated sinus bradycardia.   IMPRESSION:  1. Paroxysmal atrial fibrillation.  2. Well-controlled hypertension.  3. Psoriasis versus some type of folliculitis.   DISCUSSION:  [name] is presently stable.  I have offered her the  option of starting flecainide therapy to help control better her AFib as  she does appear to have an increase in the amount of her AFib over the  last several months.  The patient is  presently not inclined to proceed  with initiation of flecainide, but if she does, I have instructed to  call the office and will  prescribe some for and get her in for a stress test following the  initiation of flecainide if she  chooses to do so.  I will plan to see the patient back in office in 6  months or sooner if she have any additional problems.     Doylene Canning. Ladona Ridgel, MD  Electronically Signed    GWT/MedQ  DD: 12/20/2007  DT: 12/21/2007  Job #: [id number]

## 2010-07-05 NOTE — Discharge Summary (Signed)
NAMECHARLEY, [name]                 ACCOUNT NO.:  [id number]   MEDICAL RECORD NO.:  [id number]          PATIENT TYPE:  OIB   LOCATION:  3707                         FACILITY:  [facility]   PHYSICIAN:  Verne Carrow, MDDATE OF BIRTH:  [birth year]   DATE OF ADMISSION:  08/19/2008  DATE OF DISCHARGE:  08/20/2008                               DISCHARGE SUMMARY   PRIMARY CARDIOLOGIST:  Doylene Canning. Ladona Ridgel, MD   PRIMARY CARE PHYSICIAN:  Not listed.   PROCEDURES PERFORMED DURING HOSPITALIZATION:  Insertion of [employer]. Jude  Medical dual-chamber permanent pacemaker on August 19, 2008, per Dr. Lewayne Bunting, this is an [employer] DR, serial number [id number].  The patient had  both atrial and V leads placed.   FINAL DISCHARGE DIAGNOSES:  1. Symptomatic bradycardia.  2. Period of asystole for up to 6 seconds.  3. Paroxysmal atrial fibrillation.  4. Hypothyroid.  5. History of diverticulosis.  6. History of hypertension.  7. History of premature ventricular contractions.   HOSPITAL COURSE:  This is a 65 year old Caucasian female who was  admitted on August 19, 2008, for placement of pacemaker secondary to  symptomatic bradycardia along with asystole for up to 6 seconds.  The  patient was seen in the office by Dr. Ladona Ridgel and planned pacemaker  insertion was completed at that time.  The patient was seen and examined  by Dr. Ladona Ridgel on admission and pacemaker implantation was completed.  The patient tolerated the procedure well without evidence of bleeding,  hematoma, or signs of infection.  The patient had some significant  soreness postprocedure and was treated with Tylenol No. 3.  The patient  had no evidence of recurrence of syncope and telemetry revealed paced  rhythm.  The patient's pacemaker was interrogated postprocedure by [employer].  Jude representative and found to be working appropriately.  The patient  will be discharged home today on medications prior to admission along  with the pain medication.  She  will follow up in the Pacemaker Clinic in  2 weeks and with Dr. Ladona Ridgel in 3 months.   DISCHARGE LABORATORY DATA:  Sodium 136, potassium 4.3, chloride 100, CO2  of 28, BUN 12, creatinine 0.74, and glucose 117.  Hemoglobin 12.6,  hematocrit 38.1, white blood cells 8.2, and platelets 341.  PT 13.3 and  INR 1.0.   DISCHARGE VITAL SIGNS:  Blood pressure 119/62, heart rate 50,  respirations 18, temperature is 97.6, and O2 sat 95% on room air.   DISCHARGE MEDICATIONS:  1. Levothyroxine 50 mcg daily.  2. Paxil 10 mg daily.  3. Metoprolol 25 mg daily.  4. Protonix 40 mg daily.  5. Aspirin 81 mg daily.  6. Crestor 10 mg daily.  7. Tambocor 100 mg daily.  8. Methotrexate 100 mg 6 p.m. on Saturdays.  9. Demadex 10 mg daily.  10.Lorazepam 0.5 mg at bedtime.  11.Estrogen 3.75 mg daily.  12.Fish oil 2 tablets daily.  13.Darvocet-N 100 p.r.n. pain (prescription provided).   ALLERGIES:  PENICILLIN, DOXYCYCLINE and CODEINE.   FOLLOWUP PLANS AND APPOINTMENT:  1. The patient has been scheduled for Pacemaker Clinic appointment  on      September 03, 2008, at 12 p.m.  2. The patient has been given post-pacemaker insertion instructions      with particular emphasis on the pacemaker insertion site for      evidence of bleeding, hematoma, or signs of infection.  3. The patient will follow up with Dr. Lewayne Bunting in 3 months on      November 24, 2008, at 3:30 p.m.   Time spent with the patient to include physician time 35 minutes.      [name]. Pamela Bishop, NP      Verne Carrow, MD  Electronically Signed    KML/MEDQ  D:  08/20/2008  T:  08/20/2008  Job:  [id number]

## 2010-07-05 NOTE — Assessment & Plan Note (Signed)
[facility]                         ELECTROPHYSIOLOGY OFFICE NOTE   [name], [name]                        MRN:          [id number]  DATE:09/20/2006                            DOB:          1945-05-23    [name] returns today for followup.  She is a very pleasant elderly  woman with a history of atrial fibrillation and hypertension, whom I saw  most recently back in April with complaints of peripheral edema on  verapamil.  At that point, we decreased her dose, but she continued to  have worsening peripheral edema and ultimately was seen in our [facility]  office by Dr. Antoine Poche, where she had her verapamil discontinued and was  started back on her beta blocker.  I had previously stopped beta blocker  because of complaints she had about worsening of her psoriasis on the  beta blocker.  However, she does note that her peripheral edema improved  markedly.  The patient does admit to some sodium indiscretion.  The  patient also notes that, in the last several months, she has had a  couple of episodes of atrial fibrillation, which lasted most of the day  and into the night, but it is always gone by the next day.  She has had  no syncope.   PHYSICAL EXAMINATION:  She is a pleasant, middle-aged woman, in no acute  distress.  The blood pressure today was 158/84, the pulse 67 and regular,  respirations were 18, the weight was 189 pounds.  NECK:  Revealed no jugular venous distention.  LUNGS:  Clear bilaterally to auscultation.  There were no wheezes, rales  or rhonchi.  CARDIOVASCULAR EXAM:  Revealed a regular rate and rhythm with normal S1  and S2.  EXTREMITIES:  Demonstrated no cyanosis or clubbing.  There was trace  peripheral edema.   MEDICATIONS INCLUDE:  1. Levoxyl 50 micrograms a day.  2. Aspirin 81 a day.  3. Crestor 5 a day.  4. Methotrexate 2.5 weekly.  5. Torsemide 10 mg twice daily.  6. Toprol 50 a day.  7. Multiple vitamins.   IMPRESSION:  1. Paroxysmal atrial fibrillation.  2. Recurrent episodes of dizziness.  3. Hypertension (borderline, well-controlled).   DISCUSSION:  [name] continues to be somewhat challenging in that she  does have symptomatic atrial fibrillation, but it is not particularly  frequent and not particularly long in duration.  Today, we talked about  the treatment options.  For now, I have recommended that she continue on  her Toprol.  If her episodes of atrial fibrillation worsen, then we  would consider adding flecainide twice a day.  If she continues to have  spells where it sounds like she is  becoming near-syncopal, then consideration for a four-week PDS heart  monitor would be considered.  I will see her back in approximately six  to eight weeks.     Doylene Canning. Ladona Ridgel, MD  Electronically Signed    GWT/MedQ  DD: 09/20/2006  DT: 09/21/2006  Job #: [id number]   cc:   Ernestina Penna, M.D.

## 2010-07-05 NOTE — Assessment & Plan Note (Signed)
[facility]                         ELECTROPHYSIOLOGY OFFICE NOTE   Pamela, [name]                        MRN:          [id number]  DATE:11/12/2006                            DOB:          [birth year]    SUBJECTIVE:  [name] returns today for followup.  She is a very  pleasant middle aged woman with a history of paroxysmal atrial  fibrillation, psoriasis and hypertension.  The patient has been quite  stable since I saw her last, several months ago.  She does continue to  have episodes of atrial fibrillation which last hours at a time and area  associated with fatigue and shortness of breath.  There has been on  syncope and no significant chest pain.  Her main complaint today is that  of hair loss.  She also has some mild worsening of her psoriasis, though  not severe.   MEDICATIONS:  1. Levoxyl 50 daily.  2. Aspirin 81 mg a day.  3. Pantoprazole 40 a day.  4. Crestor 5 a day.  5. Methotrexate 2.5 mg 8 tablets weekly.  6. Torsemide 10 mg twice daily.  7. Toprol 50 mg daily.  8. Estrogen.  9. Multiple vitamins.   PHYSICAL EXAMINATION:  GENERAL APPEARANCE:  She is a pleasant, well-  appearing middle aged woman in no acute distress.  VITAL SIGNS:  Blood pressure 122/80. Pulse was 68 and regular.  Respirations are 16.  Weight is 190 pounds.  NECK:  Reveals no jugular venous distention.  LUNGS:  Clear bilaterally to auscultation.  No wheezes, rales or rhonchi  are present.  CARDIOVASCULAR:  Exam revealed a regular rate and rhythm with normal S1  and S2.  EXTREMITIES:  Demonstrated no edema.  HEENT:  The scalp exam did demonstrate very mild alopecia.   IMPRESSION:  1. Paroxysmal atrial fibrillation.  2. Hypertension.  3. Psoriasis.   DISCUSSION:  I discussed treatment options with [name] including  continuation of her beta blocker versus adding p.r.n. flecainide versus  taking flecainide daily.  The patient, for now, would like to  continue  as she currently is, just taking the beta blocker, though we will  consider, if  her atrial fibrillation worsens, starting her on flecainide or  alternatively Rythmol.  I will see her back in several months.     Doylene Canning. Ladona Ridgel, MD  Electronically Signed    GWT/MedQ  DD: 11/12/2006  DT: 11/13/2006  Job #: [id number]   cc:   Ernestina Penna, M.D.

## 2010-07-08 NOTE — Assessment & Plan Note (Signed)
[facility]                              CARDIOLOGY OFFICE NOTE   [name], [name]                        MRN:          [id number]  DATE:12/21/2005                            DOB:          1945-10-20    PRIMARY CARE PHYSICIAN:  Dr. Vernon Prey   REASON FOR PRESENTATION:  Patient in atrial fibrillation.   HISTORY OF PRESENT ILLNESS:  The patient is a 65 year old white female who  presents for continued discussion of atrial fibrillation.  About a week ago  she had another episode.  She did not however drive herself to the emergency  room as was the plan.  It subsequently abated.  She also gets lots of  skipped beats.  She describes these as isolated but can be frequent.  They  are symptomatic.  Patient denies presyncope or syncope.  She is not having  any chest discomfort.  There is no new shortness of breath, PND or  orthopnea.  She does have increased lower extremity swelling.  She had  herself added to the schedule.   PAST MEDICAL HISTORY:  1. Hypertension.  2. Hypothyroidism.  3. Paroxysmal atrial fibrillation.  4. Irritable bowel syndrome.  5. Diverticulosis.  6. Psoriasis.  7. Premature ventricular contractions, well preserved ejection fraction      with a negative perfusion study (this was repeated on October 18.      There is an EF of 82% with no evidence of ischemia or infarction).  8. Rectocele surgery.  9. Hysterectomy.   ALLERGIES:  None.   CURRENT MEDICATIONS:  1. Levoxyl 50 mcg daily.  2. Calcium.  3. Toprol 25 mg daily.  4. Methotrexate weekly.  5. Hydrochlorothiazide 25 mg daily.  6. Protonix 40 mg daily.  7. Aspirin 81 mg daily.  8. Cymbalta.   REVIEW OF SYSTEMS:  As stated in the HPI.  Otherwise negative for other  systems.   PHYSICAL EXAMINATION:  The patient is in no distress.  Blood pressure 155/77.  Heart rate 62 and regular.  Body mass index 31.  HEENT:  Eyes unremarkable.  Pupils equal, round, reactive to  light and fundi  not visualized.  Oral mucosa unremarkable.  NECK:  No jugular venous distention.  Wave form within normal limits.  Carotid upstroke brisk and symmetric, no bruits, no thyromegaly.  LYMPHATICS:  No cervical, axillary or inguinal adenopathy.  LUNGS:  Clear to auscultation bilaterally.  BACK:  No costovertebral angle tenderness.  CHEST:  Unremarkable.  HEART:  PMI not displaced or sustained.  S1 and S2 within normal limits.  No  S3, no significant murmurs.  ABDOMEN:  Obese, positive bowel sounds.  Normal in frequency, pitch, bruits,  rebound, no guarding.  No midline pulse, no splenomegaly,  hepatosplenomegaly.  SKIN:  No rashes.  EXTREMITIES:  2+ pulse, no edema, no cyanosis, no clubbing.  NEUROLOGIC:  Oriented to person, place and time.  Cranial nerves II through  XII grossly intact, motor grossly intact.   ASSESSMENT AND PLAN:  1. Atrial fibrillation.  The patient has atrial fibrillation.  She also  has PACs and PVCs.  I think she feels both; however, I think she      clearly feels when she is in the atrial fibrillation and she had an      episode that was sustained for several hours last week.  Unfortunately,      she still did not come to the emergency room.  We once again discussed      the plan as outlined in the early October note.  She does not want to      start flecainide chronically.  She would need to present to the      emergency room to do the pill in pocket approach.  She will consider      doing this next time.  At this point, no further cardiac testing is      suggested.  2. Low extremity swelling.  The patient has some mild lower extremity      swelling.  She did not like the Lasix.  It made her feel antsy.  I      explained that there is no central nervous system effect to this      medication; however, because of this I will give her Demadex at 10 mg a      day.  She can increase to 20 if there is no response.  She is going to      get a BMET  in 2 weeks.  She will increase her potassium containing      foods.  3. Followup.  I will see her back in a couple months in [place].    ______________________________  Rollene Rotunda, MD, [facility]    JH/MedQ  DD: 12/21/2005  DT: 12/22/2005  Job #: [id number]   cc:   Ernestina Penna, M.D.

## 2010-07-08 NOTE — Assessment & Plan Note (Signed)
[facility]                         ELECTROPHYSIOLOGY OFFICE NOTE   VONCEIL, UPSHUR                        MRN:          [id number]  DATE:06/05/2006                            DOB:          05-17-1945    Ms. Perno returns today for followup.  She is a very pleasant middle-  aged woman with a history of paroxysmal A fib and hypertension, who I  saw in return followup back in February.  At that time, she was having  increase in frequent episodes of atrial fibrillation and was not  tolerating her beta blocker, having problems with worsening psoriasis.  Because of his, I recommended that she stop her beta blocker and try  verapamil 240 mg daily.  Since then, she has had essentially no tachy  palpitations;  however, the patient has continued to have problems with  peripheral edema and some constipation.   PHYSICAL EXAMINATION:  GENERAL:  She is a pleasant, well-appearing  middle-aged woman in no acute distress.  VITAL SIGNS:  The blood pressure today was 150/75, pulse 75 and regular,  respirations 18, weight 188 pounds.  NECK:  No jugular venous distention.  LUNGS:  Clear bilaterally to auscultation.  No wheezes, rales, or  rhonchi.  CARDIOVASCULAR:  Regular rate and rhythm with normal S1 and S2.  EXTREMITIES:  Trace peripheral edema bilaterally.   IMPRESSION:  1. Paroxysmal atrial fibrillation.  2. Borderline hypertension.  3. Mild peripheral edema on verapamil.   DISCUSSION:  I have discussed treatment options with Ms. Dutt in  detail.  One option will be to continue her verapamil at the present  dose.  The second will be to decrease her dose.  The third will be  switching her to flecainide.  I have recommended that we continue her on  her present dose of verapamil, but I have given her a prescription for  180 mg daily to take if she develops intolerance regarding constipation  or peripheral edema from the verapamil.  I have wondered that  by  decreasing the dose of verapamil, she may have more A fib.  I plan to  see her back in several months.     Doylene Canning. Ladona Ridgel, MD  Electronically Signed    GWT/MedQ  DD: 06/05/2006  DT: 06/06/2006  Job #: [id number]

## 2010-07-08 NOTE — Assessment & Plan Note (Signed)
[facility]                              CARDIOLOGY OFFICE NOTE   [name], [name]                        MRN:          [id number]  DATE:11/29/2005                            DOB:          1945/06/19    PRIMARY CARE PHYSICIAN:  Ernestina Penna, M.D.   REASON FOR PRESENTATION:  Evaluate patient with atrial fibrillation.   HISTORY OF PRESENT ILLNESS:  The patient is a 65 year old white female who  scheduled this appointment because she had one week of persistent  palpitations which is consistent with her previous atrial fibrillation.  She  finally called to schedule this, as it was not going away.  She noticed a  rapid heart rate.  She feels fatigued and bad with these.  She did not  have any presyncope or syncope.  She did not have any chest discomfort, neck  discomfort, or arm discomfort.  She has had no new shortness of breath.  She  denies any PND or orthopnea.  She has been having swelling in her feet and  has had a 10 pound weight gain.  The palpitations finally broke yesterday,  but she had already scheduled this appointment.   PAST MEDICAL HISTORY:  1. Hypertension.  2. Hypothyroidism.  3. Paroxysmal atrial fibrillation.  4. Irritable bowel syndrome.  5. Diverticulosis.  6. Psoriasis.  7. Premature ventricular contractions.  8. Well-preserved ejection fraction with a negative perfusion study.  9. Rectocele surgery.  10.Hysterectomy.   ALLERGIES:  NONE.   MEDICATIONS:  1. Levoxyl 50 mcg daily.  2. Paxil 10 mg daily.  3. Toprol-XL 25 mg daily.  4. Methotrexate.  5. Hydrochlorothiazide.  6. Protonix 40 mg daily.  7. Aspirin 81 mg daily.   REVIEW OF SYSTEMS:  As stated in the HPI and otherwise negative for other  systems.   PHYSICAL EXAMINATION:  GENERAL:  The patient is in no distress.  VITAL SIGNS:  Blood pressure 150/94, heart rate 73 and regular.  Weight 184  pounds.  HEENT:  Eyes unremarkable.  Pupils are equal, round,  and reactive to light.  Fundi not visualized.  NECK:  No jugular venous distension.  Waveform within normal limits.  Carotid upstrokes brisk and symmetric.  No bruits, no thyromegaly.  LYMPHATICS:  No lymphadenopathy.  LUNGS:  Clear to auscultation bilaterally.  CHEST:  Unremarkable.  HEART:  PMI not displaced or sustained.  S1 and S2 within normal limits.  No  S3, S4, no murmurs.  ABDOMEN:  Positive bowel sounds.  Normal in frequency and pitch.  No bruits,  rebound, or guarding, no midline pulse.  No mass or organomegaly.  SKIN:  No rashes.  No nodules.  EXTREMITIES:  2+ pulses.  No edema.   IMPRESSION:  1. Atrial fibrillation.  The patient appears to be having symptomatic      paroxysms of this.  I would like to use flecainide.  It has now been      quite a while since we evaluated her heart to make sure that she has a      structurally normal heart.  Therefore, she will get a Cardiolite stress      test to rule out any evidence of ischemia and to make sure she has a      well-preserved ejection fraction.  If this is still the case, the next      time she has atrial fibrillation, she promises that she will present to      the emergency room.  At that point, the plan would be to give her a      flecainide bolus of 300 mg and watch to see if she converts.  She would      need to be watched in the emergency room for 5 hours afterward to see      that she has no dysrhythmia associated with this.  If she tolerates      this therapy and has conversion to sinus rhythm, then she could use      flecainide as a pill-in-pocket, taking the bolus dose as needed with      sustained paroxysms.  We discussed this strategy at great length, and      she agrees to present to the emergency room the next time.  2. Lower extremity swelling.  I am not clear of the etiology of this.  I      am going to give her 3 days of Lasix 20 mg daily.  She will hold her      hydrochlorothiazide during that time.  She  is going to watch her salt      and keep her feet elevated.  3. Hypertension.  Blood pressure is slightly elevated.  It has been      borderline at previous visits.  She is going to go back to [employer] and try to loose weight, and so hopefully this therapeutic      lifestyle change will bring it back to targets.  If it is not at the      next visit, I will institute another antihypertensive.   FOLLOW UP:  I am going to see her back in several weeks in [place].           ______________________________  Rollene Rotunda, MD, [facility]    JH/MedQ DD:  11/29/2005 DT:  12/01/2005 Job #:  [id number]   cc:   Ernestina Penna, M.D.

## 2010-07-08 NOTE — Assessment & Plan Note (Signed)
[facility]                            CARDIOLOGY OFFICE NOTE   [name], [name]                        MRN:          [id number]  DATE:04/04/2006                            DOB:          June 08, 1945    PRIMARY CARE PHYSICIAN:  Vernon Prey   REASON FOR PRESENTATION:  The patient with atrial fibrillation and  ventricular tachycardia.   HISTORY OF PRESENT ILLNESS:  The patient presents for followup of a  CardioNet monitor.  This has demonstrated paroxysms of atrial  fibrillation with are consistent with symptoms that she has had for  several years.  It looks like she has a fairly significant burden of  this from monitors.  What was more disturbing was that on January 31 she  was noted to have a wide complex tachycardia with a rate of about 178.  This is ventricular tachycardia.  She did not have any symptoms with  this.  She has had no presyncope or syncope. She actually does not think  she has had any more symptoms than usual.  She did talk to Dr. Graciela Husbands  that night.  She refused to come to the hospital or to be seen any  sooner in our office than today.  She did consent to increasing her  metoprolol from 25 to 50 mg, although this exacerbates her skin  condition.  She has denied any chest discomfort, neck or arm discomfort.  She has had no PND or orthopnea.  She had a negative stress perfusion  study in October of last year.   PAST MEDICAL HISTORY:  Hypertension, hypothyroidism, atrial  fibrillation, premature ventricular contractions, irritable bowel  syndrome, diverticulosis, psoriasis, rectocele surgery, hysterectomy.   ALLERGIES:  SULFA, PENICILLIN and CODEINE.   CURRENT MEDICATIONS:  1. Levothyroxine 50 mcg daily.  2. Crestor 5 mg daily.  3. Lorazepam.  4. Protonix 40 mg daily.  5. Estrogen.  6. Aspirin.  7. Metoprolol 50 mg daily.  8. Methotrexate 8 pills daily.  9. Paxil 10 mg daily.   REVIEW OF SYSTEMS:  As stated in the HPI,  otherwise negative for other  systems.   PHYSICAL EXAMINATION:  The patient is in no distress.  Blood pressure 150/84, heart rate 66 and regular.  HEENT:  Eyes unremarkable.  Pupils are equal, round and reactive to  light, fundi not visualized.  Oral mucosa unremarkable.  NECK:  No jugular venous distention at 45 degrees.  Carotid upstroke  brisk and symmetric, no bruits, no thyromegaly.  LYMPHATICS:  No cervical, axillary, inguinal adenopathy.  LUNGS:  Clear to auscultation bilaterally.  BACK:  No costovertebral angle tenderness.  CHEST:  Unremarkable.  HEART:  PMI not displaced or sustained, S1 and S2 within normal limits.  No S3, no S4, no clicks, no rubs, no murmurs.  ABDOMEN:  Flat, positive bowel sounds. Normal in frequency and pitch, no  bruits, no rebound, no guarding, no midline pulsatile mass, no  hepatosplenomegaly, no splenomegaly.  SKIN:  No rashes.  Denies.  EXTREMITIES:  2+ pulse throughout, no edema, no cyanosis or clubbing.  NEUROLOGICALLY:  Oriented to  person, place and time.  Cranial nerves II  through XII grossly intact, motor grossly intact.   ASSESSMENT AND PLAN:  1. Atrial fibrillation.  I have had multiple discussions with the      patient over the years about management of this.  She is seeing Dr.      Ladona Ridgel.  She has been very reluctant to have anything done,      including antiarrhythmics or ablation.  She does consent to go back      and see Dr. Ladona Ridgel as soon as we can get another appointment to      discuss the next step in therapy.  I think she has a significant      burden of the atrial fibrillation.  2. Ventricular tachycardia. The patient has no symptoms related to      this.  This was a fast rhythm.  She had negative stress perfusion      study recently and no evidence of coronary disease or left      ventricular dysfunction.  This may play into choice of therapy,      antiarrhythmic versus EP procedure for atrial fibrillation and she      is  going to talk to Dr. Ladona Ridgel about this.  She consents to      continuing increase dose of beta blocker.  She will come to the      emergency room should she have any tachy palpitations, presyncope,      or syncope.     Rollene Rotunda, MD, [facility]  Electronically Signed    JH/MedQ  DD: 04/04/2006  DT: 04/04/2006  Job #: [id number]   cc:   Doylene Canning. Ladona Ridgel, MD  Ernestina Penna, M.D.

## 2010-07-08 NOTE — Assessment & Plan Note (Signed)
[facility]                            CARDIOLOGY OFFICE NOTE   KASAUNDRA, FAHRNEY                        MRN:          [id number]  DATE:02/28/2006                            DOB:          02-24-1945    PRIMARY CARE PHYSICIAN:  Dr. Vernon Prey   REASON FOR PRESENTATION:  Evaluate patient with atrial fibrillation,  premature ventricular contractions.   HISTORY OF PRESENT ILLNESS:  The patient returns for followup of the  above.  She continues to have paroxysms of tachy palpitations.  She had  an episode lasting about an hour, that has been her most severe since I  last saw her.  She will take an extra Toprol and this seems to help.  However, she has not done anything that completely prevents them.  They  are symptomatic to her though,  she has not had any syncope.  She does  feel light headed and feels the rapid heartbeats.  She is not having any  chest discomfort, neck discomfort or arm discomfort.  She is having no  PND or orthopnea.  She is fatigued and says she does not sleep well.  She does snore.   PAST MEDICAL HISTORY:  Hypertension, hypothyroidism, paroxysmal atrial  fibrillation, premature ventricular contractions, irritable bowel  syndrome, diverticulosis, psoriasis, rectocele surgery, hysterectomy.   ALLERGIES:  SULFA, PENICILLIN and CODEINE.   MEDICATIONS:  1. Levothyroxine 50 mcg daily.  2. Metoprolol 25 mg daily.  3. Crestor 5 mg daily.  4. Metrix.  5. Lorazepam.  6. Methotrexate.  7. Torsemide 5 mg daily.  8. Cymbalta 30 mg daily.  9. Protonix 40 mg daily.  10.Estrogen.  11.Aspirin 81 mg daily.   REVIEW OF SYSTEMS:  As stated in the HPI.  Otherwise negative for other  systems.   PHYSICAL EXAMINATION:  The patient is in no distress.  Blood pressure 170/90, heart rate 67 and regular, weight 184 pounds.  HEENT:  Eyes unremarkable.  Pupils equal, round and reactive to light.  Fundi not visualized.  Oral mucosa normal.  NECK:   No jugular venous distention at 45 degrees, carotid upstroke  brisk and symmetric.  No bruits, no thyromegaly.  LYMPHATICS:  No cervical, axillary or inguinal adenopathy.  LUNGS:  Clear to auscultation bilaterally.  BACK:  No costovertebral angle tenderness.  CHEST:  Unremarkable.  HEART:  PMI not displaced or sustained, S1 and S2 within normal limits.  No S3, no S4, no murmurs.  ABDOMEN:  Obese, positive bowel sounds, normal in frequency and pitch,  no bruits, no rebound, no guarding, no midline pulse, no  hepatosplenomegaly, no splenomegaly.  SKIN:  No rashes, denies.  EXTREMITIES:  2+ pulses, no edema, no cyanosis, no clubbing.  NEURO:  Oriented to person, place and time.  Cranial nerves II through  XII grossly intact, motor grossly intact.   EKG:  Sinus rhythm with premature atrial contractions, axis within  normal limits, intervals within normal limits, no acute ST-T wave  changes.   ASSESSMENT/PLAN:  1. Palpitations.  The patient's palpitations most likely represent      atrial fibrillation.  However, it has been several years since an      event monitor and she did have ventricular ectopy.  She wants to      talk about a possible ablation and I would need to confirm which      rhythm is her symptomatic rhythm.  Therefore, we will place a 21      day CardioNet.  Further discussion will be based on these results.  2. Hypertension.  Blood pressure is elevated, is following closely      with her primary care doctor.  She is very hesitant to take other      medications, but will probably need the addition of another agent.      She has been sensitive to meds and might not tolerate up titration      of metoprolol.  She could be considered on an angio tensing      converting enzyme inhibitor.  I will defer to her primary doctor      for this.  3. Followup.  I will see her back in 1 month or sooner based on the      results of the CardioNet.     Rollene Rotunda, MD, [facility]   Electronically Signed    JH/MedQ  DD: 02/28/2006  DT: 02/28/2006  Job #: [id number]

## 2010-07-08 NOTE — Assessment & Plan Note (Signed)
[facility]                         ELECTROPHYSIOLOGY OFFICE NOTE   [name], [name]                        MRN:          [id number]  DATE:04/12/2006                            DOB:          1945-12-21    [name] is referred today by Dr. Angelina Sheriff.  I initially saw the  patient back in 2002 for paroxysmal atrial fibrillation.  At that time I  had recommended that she be started on flecainide in conjunction with  her beta blocker.  The patient, however, decided not to take the  flecainide and was using her beta blockers to help control her atrial  fibrillation.  Over the last several months she noted increasingly  frequent episodes of atrial fibrillation with a rapid ventricular  response.  She has also been wearing a CardioNet monitor and had one  episode of nonsustained VT.  She has had no syncope.  She states that  some days she will have no palpitations but most days she has at least  some, typically lasting for a few minutes in duration, sometimes longer.   MEDICATIONS:  Include Synthroid, Crestor, lorazepam, torsemide,  Protonix, estrogen, aspirin, metoprolol, methotrexate, and Paxil.   ADDITIONAL PAST MEDICAL HISTORY:  Notable for psoriasis.  She also has a  history of hypertension.  She also has a history of diverticulosis and  irritable bowel syndrome and hypothyroidism.   FAMILY HISTORY:  Positive for atrial fibrillation.  There is no  premature coronary disease.   SOCIAL HISTORY:  The patient denies tobacco or ethanol abuse.  She is  not married.   FAMILY HISTORY:  Positive for coronary disease.   REVIEW OF SYSTEMS:  Except for pruritus around her psoriasis, all  systems reviewed and found to be negative except as noted in the HPI.   EXAMINATION:  GENERAL:  She is a pleasant, middle-aged woman in no acute  distress.  VITAL SIGNS:  Blood pressure today was 160/77, the pulse was 61 and  regular, the respirations were 18, the  weight was 185 pounds.  HEENT:  Normocephalic and atraumatic.  Pupils equal and round.  The  oropharynx was moist, the sclerae were anicteric.  NECK:  Revealed no jugular venous distention.  There was no thyromegaly.  Trachea was midline.  The carotids are 2+ and symmetric.  LUNGS:  Clear bilaterally to auscultation.  There are no wheezes, rales  or rhonchi.  CARDIOVASCULAR:  Revealed a regular rate and rhythm with normal S1 and  S2.  There are no murmurs, rubs or gallops.  EXTREMITIES:  Demonstrated no cyanosis, clubbing or edema.  SKIN:  Demonstrated  multiple erythematous scaly plaques consistent with  psoriasis.  NEUROLOGIC:  Alert and oriented x3 with cranial nerves intact.  The  strength was 5/5 and symmetric.   EKG demonstrates sinus rhythm.  Telemetry strips demonstrate episodes of  atrial fibrillation and one episode of nonsustained VT.   IMPRESSION:  1. Atrial fibrillation.  2. Nonsustained ventricular tachycardia in the setting of normal left      ventricular function and no coronary disease.  3. Borderline hypertension.  DISCUSSION:  I discussed treatment options with [name] in detail.  Her psoriasis has worsened, raising the question as to whether or not we  should think about stopping her beta blocker therapy, which has become  ineffective in controlling her palpitations.  I also talked about  antiarrhythmic drug therapy with flecainide.  She, despite my  suggestion, is not interested in flecainide at the present time.  For  this reason, we will try her on verapamil 240 mg daily and stop her beta  blocker.  I will see her back in the clinic in several weeks and at that  time if her palpitations are well controlled, then will continue on  verapamil.  Otherwise, will consider adding low-dose flecainide. In  addition, we talked about catheter ablation but I assured her that the  risk of catheter ablation was far greater than the risk of taking low-  dose flecainide  which has resulted in her being very disinterested in  considering ablation therapy for her palpitations and atrial  arrhythmias.     Doylene Canning. Ladona Ridgel, MD  Electronically Signed    GWT/MedQ  DD: 04/12/2006  DT: 04/12/2006  Job #: [id number]   cc:   Ernestina Penna, M.D.

## 2010-07-10 ENCOUNTER — Encounter: Payer: Self-pay | Admitting: *Deleted

## 2010-07-14 ENCOUNTER — Encounter: Payer: Self-pay | Admitting: Internal Medicine

## 2010-09-14 ENCOUNTER — Telehealth: Payer: Self-pay | Admitting: Internal Medicine

## 2010-09-14 ENCOUNTER — Other Ambulatory Visit: Payer: Self-pay | Admitting: *Deleted

## 2010-09-14 MED ORDER — CARVEDILOL 12.5 MG PO TABS: 12.5000 mg | ORAL_TABLET | ORAL | Status: DC

## 2010-09-14 NOTE — Telephone Encounter (Signed)
cardivolol 12.5 mg [facility] in [place] [phone].  

## 2010-09-14 NOTE — Telephone Encounter (Signed)
Refill sent to [facility]. [name]

## 2010-09-15 NOTE — Telephone Encounter (Signed)
cardivolol 12.5 mg [facility] in [place] [phone].

## 2010-09-22 ENCOUNTER — Ambulatory Visit (INDEPENDENT_AMBULATORY_CARE_PROVIDER_SITE_OTHER): Payer: BC Managed Care – PPO | Admitting: *Deleted

## 2010-09-22 DIAGNOSIS — I4891 Unspecified atrial fibrillation: Secondary | ICD-10-CM

## 2010-09-22 DIAGNOSIS — Z950 Presence of cardiac pacemaker: Secondary | ICD-10-CM

## 2010-09-27 ENCOUNTER — Other Ambulatory Visit: Payer: Self-pay | Admitting: Internal Medicine

## 2010-09-27 ENCOUNTER — Encounter: Payer: Self-pay | Admitting: Internal Medicine

## 2010-09-27 LAB — REMOTE PACEMAKER DEVICE
AL AMPLITUDE: 3.8 mv
AL IMPEDENCE PM: 450 Ohm
ATRIAL PACING PM: 19
BAMS-0001: 150 {beats}/min
BAMS-0003: 70 {beats}/min
BATTERY VOLTAGE: 2.98 v
DEVICE MODEL PM: 2285953
RV LEAD AMPLITUDE: 6.4 mv
RV LEAD IMPEDENCE PM: 390 Ohm
VENTRICULAR PACING PM: 1

## 2010-09-28 NOTE — Progress Notes (Signed)
Pacer remote check  

## 2010-10-07 ENCOUNTER — Encounter: Payer: Self-pay | Admitting: *Deleted

## 2010-10-11 ENCOUNTER — Other Ambulatory Visit: Payer: Self-pay | Admitting: Obstetrics and Gynecology

## 2010-12-01 ENCOUNTER — Telehealth: Payer: Self-pay | Admitting: Internal Medicine

## 2010-12-01 NOTE — Telephone Encounter (Signed)
Pt returning your call

## 2010-12-01 NOTE — Telephone Encounter (Signed)
lmom for patient to call me back  She has an appointment for 12/26/10

## 2010-12-01 NOTE — Telephone Encounter (Signed)
Pt having " pounding in chest" x 3 weeks, some a-fib, feels tired

## 2010-12-01 NOTE — Telephone Encounter (Signed)
Returned call to patient and she states she has been having a pounding in her chest  Last transmission was in 08/2010 Questioning if Flecainide can have effect on eyes?  I told her I had not heard of that She has lost 30lbs and the afib got better  Almost like medication does not last the entire 12 hours  She doesn't think she can tolerate an increase of medication

## 2010-12-07 NOTE — Telephone Encounter (Signed)
Returned call to patient and lmom that her transmission was normal and we just need to keep the medication the same for now  She can call me back if things get worse  If not will see her as scheduled

## 2010-12-29 ENCOUNTER — Encounter: Payer: Self-pay | Admitting: Internal Medicine

## 2010-12-29 ENCOUNTER — Ambulatory Visit (INDEPENDENT_AMBULATORY_CARE_PROVIDER_SITE_OTHER): Payer: BC Managed Care – PPO | Admitting: Internal Medicine

## 2010-12-29 DIAGNOSIS — Z950 Presence of cardiac pacemaker: Secondary | ICD-10-CM | POA: Insufficient documentation

## 2010-12-29 DIAGNOSIS — I4891 Unspecified atrial fibrillation: Secondary | ICD-10-CM

## 2010-12-29 LAB — PACEMAKER DEVICE OBSERVATION
AL AMPLITUDE: 4.5 mv
AL IMPEDENCE PM: 462.5 Ohm
AL THRESHOLD: 1 V
BAMS-0001: 150 {beats}/min
BAMS-0003: 70 {beats}/min
BATTERY VOLTAGE: 2.9779 V
DEVICE MODEL PM: 2285953
RV LEAD AMPLITUDE: 6.5 mv
RV LEAD IMPEDENCE PM: 400 Ohm
RV LEAD THRESHOLD: 1.5 V

## 2010-12-29 NOTE — Assessment & Plan Note (Signed)
Her device is working normally. Histograms demonstrate no atrial fibrillation in the last 4 months. We'll plan a recheck in several months.

## 2010-12-29 NOTE — Progress Notes (Signed)
HPI [name] returns today for followup. She is a pleasant middle-aged woman with a history of symptomatic bradycardia status post pacemaker insertion. She has a history of paroxysmal atrial fibrillation with a rapid ventricular response. She has been fairly well controlled with flecainide. She also has hypertension which has been difficult to control. Since we last saw the patient, she has lost 30 lbs. She denies anginal symptoms though she does get sore a day or so after strenuous exercise. No/minimal palpitations. Allergies  Allergen Reactions  . Caudal Tray (Lidocaine-Epinephrine)     Edema    . Codeine     REACTION: nausea  . Diovan (Valsartan) Itching  . Doxycycline   . Levaquin     Insomnia    . Nexium     Headache    . Penicillins   . Prilosec (Omeprazole)   . Sulfa Drugs Cross Reactors   . Triple Antibiotic   . Verapamil     Edema   . Vimovo (Naproxen-Esomeprazole)     Upset stomach   . Zocor (Simvastatin)     Edema, and myalagias      Current Outpatient Prescriptions  Medication Sig Dispense Refill  . aspirin (ASPIRIN LOW DOSE) 81 MG EC tablet Take 81 mg by mouth 2 (two) times daily.        . Calcium Carbonate-Vit D-Min (CALCIUM 1200 PO) Take by mouth.        . carvedilol (COREG) 12.5 MG tablet Take 1 tablet (12.5 mg total) by mouth as directed. Take one tablet by mouth in the morning , then take 2 at night    90 tablet  6  . docusate sodium (COLACE) 100 MG capsule Take 100 mg by mouth daily.        . flecainide (TAMBOCOR) 100 MG tablet Take 100 mg by mouth 2 (two) times daily.        Marland Kitchen levothyroxine (SYNTHROID, LEVOTHROID) 100 MCG tablet Take 100 mcg by mouth. Take one tablet by mouth every day except on Monday, Wednesday, and Friday's take one and a half tablets by mouth.       Marland Kitchen LORazepam (ATIVAN) 0.5 MG tablet Take 0.5 mg by mouth as needed.        . methotrexate (RHEUMATREX) 2.5 MG tablet Take 5 mg by mouth once a week. Caution:Chemotherapy. Protect from  light.       . Multiple Vitamin (MULTIVITAMIN) tablet Take 1 tablet by mouth daily.        . Multiple Vitamins-Minerals (EYE SUPPORT PO) Take by mouth. Take 2 capsules daily         . Multiple Vitamins-Minerals (HAIR VITAMINS PO) Take by mouth.        . omega-3 acid ethyl esters (LOVAZA) 1 G capsule Take 2 g by mouth daily.        Marland Kitchen omeprazole (PRILOSEC) 40 MG capsule Take 40 mg by mouth daily.        Marland Kitchen PARoxetine (PAXIL) 10 MG tablet Take 10 mg by mouth every morning.        . rosuvastatin (CRESTOR) 10 MG tablet Take 10 mg by mouth daily.           Past Medical History  Diagnosis Date  . Impaired glucose tolerance   . Psoriasis   . IBS (irritable bowel syndrome)     vs diarrhea vs abd. fullness   . Chronic anxiety   . Uterine prolapse   . Bronchial spasms   . Edema   . PVC's (  premature ventricular contractions)   . Fatigue   . Atrial fibrillation   . Hypertension   . Esophageal stricture   . Elevated blood sugar   . Diverticulosis   . Rectocele, female   . History of bladder repair surgery   . Hx of vaginal hysterectomy   . Vaginal prolapse 1998  . Uterine prolapse     ROS:   All systems reviewed and negative except as noted in the HPI.   Past Surgical History  Procedure Date  . Vaginal hysterectomy     prolapse   . Bladder suspension   . Rectocele repair   . Transthoracic echocardiogram 2008     No family history on file.   History   Social History  . Marital Status: Married    Spouse Name: N/A    Number of Children: N/A  . Years of Education: N/A   Occupational History  . Not on file.   Social History Main Topics  . Smoking status: Never Smoker   . Smokeless tobacco: Not on file  . Alcohol Use: Yes  . Drug Use: No  . Sexually Active: Not on file   Other Topics Concern  . Not on file   Social History Narrative  . No narrative on file     BP 136/70  Pulse 59  Ht 5\' 4"  (1.626 m)  Wt 157 lb 12.8 oz (71.578 kg)  BMI 27.09  kg/m2  Physical Exam:  Well appearing NAD middle age woman, NAD. HEENT: Unremarkable Neck:  No JVD, no thyromegally Lymphatics:  No adenopathy Back:  No CVA tenderness Lungs:  Clear with no wheezes, rales, or rhonchi. Well healed PPM incision. HEART:  Regular rate rhythm, no murmurs, no rubs, no clicks Abd:  soft, positive bowel sounds, no organomegally, no rebound, no guarding Ext:  2 plus pulses, no edema, no cyanosis, no clubbing Skin:  No rashes no nodules Neuro:  CN II through XII intact, motor grossly intact  DEVICE  Normal device function.  See PaceArt for details.   Assess/Plan:

## 2010-12-29 NOTE — Assessment & Plan Note (Signed)
He has had essentially no atrial arrhythmias including no atrial fibrillation in the last 4 months. She will continue her current medical therapy.

## 2010-12-29 NOTE — Patient Instructions (Signed)
Remote monitoring is used to monitor your Pacemaker of ICD from home. This monitoring reduces the number of office visits required to check your device to one time per year. It allows us to keep an eye on the functioning of your device to ensure it is working properly. You are scheduled for a device check from home on 03/30/11. You may send your transmission at any time that day. If you have a wireless device, the transmission will be sent automatically. After your physician reviews your transmission, you will receive a postcard with your next transmission date.   Your physician wants you to follow-up in: 1 year with Dr. Taylor. You will receive a reminder letter in the mail two months in advance. If you don't receive a letter, please call our office to schedule the follow-up appointment.  Your physician recommends that you continue on your current medications as directed. Please refer to the Current Medication list given to you today.   

## 2011-02-28 ENCOUNTER — Telehealth: Payer: Self-pay | Admitting: Internal Medicine

## 2011-02-28 NOTE — Telephone Encounter (Signed)
Spoke with patient  She is going to send in a transmission

## 2011-02-28 NOTE — Telephone Encounter (Signed)
Spoke with patient and advised her that Dr Ladona Ridgel does not want to make any changes at present  She did go out of rhythm around 12:30 this morning and went back into rhythm this morning around 5:00am  He advised she take her medications as directed and try and rest today.  Patient verbalized understanding

## 2011-02-28 NOTE — Telephone Encounter (Signed)
Pt calling re increase in a-fib, hardly slept last night, wants to know if she should she send in a transmission?

## 2011-03-28 ENCOUNTER — Telehealth: Payer: Self-pay | Admitting: Internal Medicine

## 2011-03-28 NOTE — Telephone Encounter (Signed)
Dr Ladona Ridgel spoke with Dr Christell Constant

## 2011-03-28 NOTE — Telephone Encounter (Signed)
New Msg: Dr. Kathi Der office calling wanting to speak with nurse regarding pt symptoms: pounding in chest at night, symptoms of a-fib. Dr.Moore wants to know if MD would recommend putting a Holter on patient.

## 2011-03-30 ENCOUNTER — Encounter: Payer: BC Managed Care – PPO | Admitting: *Deleted

## 2011-04-03 ENCOUNTER — Encounter: Payer: Self-pay | Admitting: *Deleted

## 2011-04-06 ENCOUNTER — Telehealth: Payer: Self-pay | Admitting: Internal Medicine

## 2011-04-06 NOTE — Telephone Encounter (Signed)
04-06-11 lmm @ 246 for pt to cal to set up 2-3 week appt for palpitations per kelly/mt

## 2011-04-10 ENCOUNTER — Other Ambulatory Visit: Payer: Self-pay | Admitting: Internal Medicine

## 2011-04-10 ENCOUNTER — Other Ambulatory Visit: Payer: Self-pay

## 2011-04-10 MED ORDER — CARVEDILOL 12.5 MG PO TABS: 12.5000 mg | ORAL_TABLET | ORAL | Status: DC

## 2011-04-26 ENCOUNTER — Ambulatory Visit (INDEPENDENT_AMBULATORY_CARE_PROVIDER_SITE_OTHER): Payer: BC Managed Care – PPO | Admitting: Internal Medicine

## 2011-04-26 ENCOUNTER — Encounter: Payer: Self-pay | Admitting: Internal Medicine

## 2011-04-26 ENCOUNTER — Telehealth: Payer: Self-pay | Admitting: Internal Medicine

## 2011-04-26 VITALS — BP 142/70 | HR 60 | Wt 169.1 lb

## 2011-04-26 DIAGNOSIS — Z950 Presence of cardiac pacemaker: Secondary | ICD-10-CM

## 2011-04-26 DIAGNOSIS — I4891 Unspecified atrial fibrillation: Secondary | ICD-10-CM

## 2011-04-26 LAB — PACEMAKER DEVICE OBSERVATION
AL AMPLITUDE: 5 mv
AL IMPEDENCE PM: 450 Ohm
AL THRESHOLD: 1 V
ATRIAL PACING PM: 22
BAMS-0001: 150 {beats}/min
BAMS-0003: 70 {beats}/min
BATTERY VOLTAGE: 2.9779 V
DEVICE MODEL PM: 2285953
RV LEAD AMPLITUDE: 6.8 mv
RV LEAD IMPEDENCE PM: 400 Ohm
RV LEAD THRESHOLD: 1.5 V
VENTRICULAR PACING PM: 1

## 2011-04-26 MED ORDER — AMIODARONE HCL 200 MG PO TABS: 200.0000 mg | ORAL_TABLET | Freq: Every day | ORAL | Status: DC

## 2011-04-26 NOTE — Assessment & Plan Note (Signed)
The patient has had increasing amounts of atrial fibrillation and is quite symptomatic. In addition, she has an increase in ventricular ectopy. I recommended that she stop her flecainide and start taking amiodarone. We'll see her back in a couple of months. We'll plan to check labs today prior to the initiation of amiodarone. Finally I recommended that she undergo a 2-D echo to reevaluate her left ventricular function and her left atrial dimension.

## 2011-04-26 NOTE — Patient Instructions (Addendum)
Your physician recommends that you schedule a follow-up appointment in: 3 months with Dr Ladona Ridgel  Your physician has recommended you make the following change in your medication:  1) Stop Flecainide 2) Start Amiodarone 200mg  daily  Your physician recommends that you return for lab work today Hepatic/liver   Your physician has requested that you have an echocardiogram. Echocardiography is a painless test that uses sound waves to create images of your heart. It provides your doctor with information about the size and shape of your heart and how well your heart's chambers and valves are working. This procedure takes approximately one hour. There are no restrictions for this procedure.

## 2011-04-26 NOTE — Assessment & Plan Note (Signed)
Her device is working normally. We'll plan to recheck in several months. 

## 2011-04-26 NOTE — Telephone Encounter (Signed)
error 

## 2011-04-26 NOTE — Progress Notes (Signed)
HPI [name] returns today for followup. She is a very pleasant 66 year old woman with a history of symptomatic tachybradycardia syndrome status post permanent pacemaker insertion. She has paroxysmal atrial fibrillation. Her symptoms have been well controlled for several years on flecainide. Over the last several months, she has had increasingly frequent episodes of palpitations. She saw her primary physician and had a cardiac monitor obtained which demonstrated recurrent atrial fibrillation as well as nonsustained ventricular tachycardia and frequent PVCs. She has not had frank syncope. She denies noncompliance. Allergies  Allergen Reactions  . Caudal Tray (Lidocaine-Epinephrine)     Edema    . Codeine     REACTION: nausea  . Diovan (Valsartan) Itching  . Doxycycline   . Levaquin     Insomnia    . Nexium     Headache    . Penicillins   . Prilosec (Omeprazole)   . Sulfa Drugs Cross Reactors   . Triple Antibiotic   . Verapamil     Edema   . Vimovo (Naproxen-Esomeprazole)     Upset stomach   . Zocor (Simvastatin)     Edema, and myalagias      Current Outpatient Prescriptions  Medication Sig Dispense Refill  . aspirin (ASPIRIN LOW DOSE) 81 MG EC tablet Take 81 mg by mouth 2 (two) times daily.        . Calcium Carbonate-Vit D-Min (CALCIUM 1200 PO) Take by mouth.        . carvedilol (COREG) 12.5 MG tablet Take 12.5 mg by mouth as directed. Take one tablet by mouth in the morning , then take 1.5 at night      . docusate sodium (COLACE) 100 MG capsule Take 100 mg by mouth daily.        Marland Kitchen levothyroxine (SYNTHROID, LEVOTHROID) 100 MCG tablet Take 100 mcg by mouth. Take one tablet by mouth every day except on Monday, Wednesday, and Friday's take one and a half tablets by mouth.       Marland Kitchen LORazepam (ATIVAN) 0.5 MG tablet Take 0.5 mg by mouth as needed.        . methotrexate (RHEUMATREX) 2.5 MG tablet Take 5 mg by mouth once a week. Caution:Chemotherapy. Protect from light.       .  Multiple Vitamin (MULTIVITAMIN) tablet Take 1 tablet by mouth daily.        . Multiple Vitamins-Minerals (EYE SUPPORT PO) Take by mouth. Take 2 capsules daily         . Multiple Vitamins-Minerals (HAIR VITAMINS PO) Take by mouth.        . NON FORMULARY Take 3.75 mg by mouth daily. Biestrogen      . omega-3 acid ethyl esters (LOVAZA) 1 G capsule Take 2 g by mouth daily.        Marland Kitchen omeprazole (PRILOSEC) 40 MG capsule Take 40 mg by mouth daily.        Marland Kitchen PARoxetine (PAXIL) 10 MG tablet Take 10 mg by mouth every morning.        . rosuvastatin (CRESTOR) 10 MG tablet Take 10 mg by mouth daily.        Marland Kitchen torsemide (DEMADEX) 20 MG tablet Take 20 mg by mouth daily.      Marland Kitchen amiodarone (PACERONE) 200 MG tablet Take 1 tablet (200 mg total) by mouth daily.  30 tablet  11     Past Medical History  Diagnosis Date  . Impaired glucose tolerance   . Psoriasis   . IBS (irritable bowel syndrome)  vs diarrhea vs abd. fullness   . Chronic anxiety   . Uterine prolapse   . Bronchial spasms   . Edema   . PVC's (premature ventricular contractions)   . Fatigue   . Atrial fibrillation   . Hypertension   . Esophageal stricture   . Elevated blood sugar   . Diverticulosis   . Rectocele, female   . History of bladder repair surgery   . Hx of vaginal hysterectomy   . Vaginal prolapse 1998  . Uterine prolapse   . Ventricular tachycardia   . Hypothyroidism   . Psoriasis   . Premature ventricular contractions   . Ventricular fibrillation     ROS:   All systems reviewed and negative except as noted in the HPI.   Past Surgical History  Procedure Date  . Vaginal hysterectomy     prolapse   . Bladder suspension   . Rectocele repair   . Transthoracic echocardiogram 2008     No family history on file.   History   Social History  . Marital Status: Married    Spouse Name: N/A    Number of Children: N/A  . Years of Education: N/A   Occupational History  . Not on file.   Social History Main  Topics  . Smoking status: Never Smoker   . Smokeless tobacco: Not on file  . Alcohol Use: Yes  . Drug Use: No  . Sexually Active: Not on file   Other Topics Concern  . Not on file   Social History Narrative  . No narrative on file     BP 142/70  Pulse 60  Wt 76.712 kg (169 lb 1.9 oz)  Physical Exam:  Well appearing middle-aged woman, NAD HEENT: Unremarkable Neck:  No JVD, no thyromegally Lymphatics:  No adenopathy Back:  No CVA tenderness Lungs:  Clear with no wheezes, rales, or rhonchi. HEART:  Regular rate rhythm, no murmurs, no rubs, no clicks Abd:  soft, positive bowel sounds, no organomegally, no rebound, no guarding Ext:  2 plus pulses, no edema, no cyanosis, no clubbing Skin:  No rashes no nodules Neuro:  CN II through XII intact, motor grossly intact  DEVICE  Normal device function.  See PaceArt for details. She is in atrial fibrillation 5%.  Assess/Plan:

## 2011-04-27 LAB — HEPATIC FUNCTION PANEL
ALT: 29 U/L (ref 0–35)
AST: 30 U/L (ref 0–37)
Albumin: 4 g/dL (ref 3.5–5.2)
Alkaline Phosphatase: 44 U/L (ref 39–117)
Bilirubin, Direct: 0 mg/dL (ref 0.0–0.3)
Total Bilirubin: 0 mg/dL — ABNORMAL LOW (ref 0.3–1.2)
Total Protein: 6.8 g/dL (ref 6.0–8.3)

## 2011-04-27 LAB — TSH: TSH: 2.41 u[IU]/mL (ref 0.35–5.50)

## 2011-04-28 ENCOUNTER — Telehealth: Payer: Self-pay | Admitting: Internal Medicine

## 2011-04-28 NOTE — Telephone Encounter (Signed)
Heart rate is around 53 and the palpitations are a little better.

## 2011-04-28 NOTE — Telephone Encounter (Signed)
Pt to take carvedilol two tabs am and two tabs pm and Dr Ladona Ridgel to address the amiodarone issue per Dr Tenny Craw.  Pt states she is hesitant to take less than the two tabs bid of the carvedilol.

## 2011-04-28 NOTE — Telephone Encounter (Signed)
Pt is calling to report that her BP is 152/72 after two extra one-half tabs of Carvedilol.  She states she is afraid to take any more of the amiodarone.

## 2011-04-28 NOTE — Telephone Encounter (Signed)
I discussed the patient' situation with Dr. Tenny Craw. She called the patient and explained that what she was feeling yesterday was most likely coincidental with the initiation of the amiodarone. Dr. Tenny Craw explained to the patient that the amiodarone takes a little while to build up in her system. She first encouraged the patient to hold the amiodarone today, then start 100 mg tomorrow. The patient reported a headache today due to elevated BP possibly. Dr. Tenny Craw then encouraged the patient to take an extra 1/2 a tablet of her carvedilol now, and then another 1/2 at 1:00 pm today. The patient is to call the office back around 1:00 pm and let us know how she is feeling. Dr. Tenny Craw explained to the patient that he carvedilol will help to slow her heart rate, but it will not keep her out of a-fib. We will await a call back from the patient this afternoon.

## 2011-04-28 NOTE — Telephone Encounter (Signed)
Fu call Patient was calling back from this morning

## 2011-04-28 NOTE — Telephone Encounter (Signed)
Fu call Patient calling back again she wants to talk to someone

## 2011-04-28 NOTE — Telephone Encounter (Signed)
I spoke with the patient. She states she took her first dose of amiodarone 200 mg yesterday. She felt tired and sleepy most of the day. Last night she had an episode of a-fib with HR's above 100 bpm and her BP was elevated for her at 140's/96. She did have some dizziness and lightheadedness with standing. She had to sit up in the chair last night as she was very uncomfortable and could not sit still. This morning her heart rate has slowed but she is still irregular. She has not taken any medications this morning and is refusing to take amiodarone again. She wanted to know if Dr. Ladona Ridgel would put her back on flecainide. I explained I will review with the MD and call her back.

## 2011-04-28 NOTE — Telephone Encounter (Signed)
Please return call to patient at hm# [phone]   Patient thinks she is having a bad reaction to amiodarone (PACERONE) 200 MG tablet RX.  Patient c/o nervousness, elevated BP, racing heart rate all night long. No SOB or dizziness.  Please return call to patient.

## 2011-05-01 NOTE — Telephone Encounter (Signed)
Spoke with patient  Let her know Dr Ladona Ridgel is on Vacation and I do not know what to tell her to take  She says she felt better on Flecainide and wants to go back to what she was taking.  She is going to go back on the Flecainide and not take the Amiodarone, she only took one dose of it to begin with  I told her I would talk with Dr Ladona Ridgel next week

## 2011-05-01 NOTE — Telephone Encounter (Signed)
F/U   Please return call to patient to discuss concerns, she can be reached at  [phone]

## 2011-05-01 NOTE — Telephone Encounter (Signed)
Pt really needs a call back asap she is not able sleep and really needs to talk to you

## 2011-05-04 NOTE — Telephone Encounter (Signed)
Ok

## 2011-05-08 ENCOUNTER — Other Ambulatory Visit: Payer: Self-pay

## 2011-05-08 ENCOUNTER — Ambulatory Visit (HOSPITAL_COMMUNITY): Payer: BC Managed Care – PPO | Attending: Cardiology

## 2011-05-08 DIAGNOSIS — R002 Palpitations: Secondary | ICD-10-CM | POA: Insufficient documentation

## 2011-05-08 DIAGNOSIS — E785 Hyperlipidemia, unspecified: Secondary | ICD-10-CM | POA: Insufficient documentation

## 2011-05-08 DIAGNOSIS — I4891 Unspecified atrial fibrillation: Secondary | ICD-10-CM | POA: Insufficient documentation

## 2011-05-08 DIAGNOSIS — I10 Essential (primary) hypertension: Secondary | ICD-10-CM | POA: Insufficient documentation

## 2011-05-08 NOTE — Telephone Encounter (Signed)
lmom for pt that Dr Ladona Ridgel is aware and fine with her going back on Flecainide

## 2011-05-09 ENCOUNTER — Telehealth: Payer: Self-pay | Admitting: Internal Medicine

## 2011-05-09 NOTE — Telephone Encounter (Signed)
lmom on home phone and cell phone

## 2011-05-09 NOTE — Telephone Encounter (Signed)
Aware of echo results and we have offered her Tikosyn load and an appointment with Dr Johney Frame if she wants to come in for an ablation. She says she will think about her options and call us with how she wishes to proceed

## 2011-05-09 NOTE — Telephone Encounter (Signed)
Pt was told to call if any problems with meds, pls call

## 2011-07-05 ENCOUNTER — Encounter: Payer: Self-pay | Admitting: Internal Medicine

## 2011-07-07 ENCOUNTER — Other Ambulatory Visit: Payer: Self-pay | Admitting: Internal Medicine

## 2011-07-07 ENCOUNTER — Encounter: Payer: Self-pay | Admitting: Internal Medicine

## 2011-07-11 ENCOUNTER — Other Ambulatory Visit: Payer: Self-pay | Admitting: Internal Medicine

## 2011-07-19 ENCOUNTER — Encounter: Payer: Self-pay | Admitting: *Deleted

## 2011-07-21 ENCOUNTER — Ambulatory Visit (INDEPENDENT_AMBULATORY_CARE_PROVIDER_SITE_OTHER): Payer: BC Managed Care – PPO | Admitting: Gastroenterology

## 2011-07-21 ENCOUNTER — Encounter: Payer: Self-pay | Admitting: Gastroenterology

## 2011-07-21 ENCOUNTER — Other Ambulatory Visit (INDEPENDENT_AMBULATORY_CARE_PROVIDER_SITE_OTHER): Payer: BC Managed Care – PPO

## 2011-07-21 VITALS — BP 162/80 | HR 56 | Ht 64.0 in | Wt 174.2 lb

## 2011-07-21 DIAGNOSIS — R195 Other fecal abnormalities: Secondary | ICD-10-CM

## 2011-07-21 DIAGNOSIS — K219 Gastro-esophageal reflux disease without esophagitis: Secondary | ICD-10-CM

## 2011-07-21 DIAGNOSIS — L408 Other psoriasis: Secondary | ICD-10-CM

## 2011-07-21 DIAGNOSIS — R531 Weakness: Secondary | ICD-10-CM

## 2011-07-21 DIAGNOSIS — D649 Anemia, unspecified: Secondary | ICD-10-CM

## 2011-07-21 DIAGNOSIS — R5383 Other fatigue: Secondary | ICD-10-CM

## 2011-07-21 DIAGNOSIS — R5381 Other malaise: Secondary | ICD-10-CM

## 2011-07-21 DIAGNOSIS — Z9225 Personal history of immunosupression therapy: Secondary | ICD-10-CM

## 2011-07-21 DIAGNOSIS — K5901 Slow transit constipation: Secondary | ICD-10-CM

## 2011-07-21 DIAGNOSIS — I4891 Unspecified atrial fibrillation: Secondary | ICD-10-CM

## 2011-07-21 DIAGNOSIS — K7689 Other specified diseases of liver: Secondary | ICD-10-CM

## 2011-07-21 DIAGNOSIS — K222 Esophageal obstruction: Secondary | ICD-10-CM

## 2011-07-21 DIAGNOSIS — R109 Unspecified abdominal pain: Secondary | ICD-10-CM

## 2011-07-21 DIAGNOSIS — K760 Fatty (change of) liver, not elsewhere classified: Secondary | ICD-10-CM

## 2011-07-21 DIAGNOSIS — L308 Other specified dermatitis: Secondary | ICD-10-CM

## 2011-07-21 LAB — FOLATE: Folate: 20 ng/mL (ref >5.9)

## 2011-07-21 LAB — IBC PANEL
Iron: 51 ug/dL (ref 42–145)
Saturation Ratios: 10 % — ABNORMAL LOW (ref 20.0–50.0)
Transferrin: 364.6 mg/dL — ABNORMAL HIGH (ref 212.0–360.0)

## 2011-07-21 LAB — FERRITIN: Ferritin: 13.1 ng/mL (ref 10.0–291.0)

## 2011-07-21 LAB — SEDIMENTATION RATE: Sed Rate: 16 mm/hr (ref 0–22)

## 2011-07-21 LAB — VITAMIN B12: Vitamin B-12: 1019 pg/mL — ABNORMAL HIGH (ref 211–911)

## 2011-07-21 MED ORDER — MOVIPREP 100 G PO SOLR: 1.0000 | Freq: Once | ORAL | Status: DC

## 2011-07-21 MED ORDER — LINACLOTIDE 145 MCG PO CAPS: 1.0000 | ORAL_CAPSULE | Freq: Every day | ORAL | Status: DC

## 2011-07-21 NOTE — Progress Notes (Signed)
History of Present Illness:  This is a very pleasant 66 year old Caucasian female patient of Dr. Rudi Heap, Dr. Sharrell Ku and Dr. Gibson Ramp at [facility] dermatology. 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 Review of her liver function test showed no abnormalities. As part of her recent workup she was found to have guaiac positive stools and mild iron deficiency but no anemia. She complains of chronic fatigue, and weakness in her proximal shoulder and hip muscles. She denies any other neurologic or neuromuscular problems. There've been no mental status problems, abdominal swelling, or hepatobiliary complaints. She does have hyperlipidemia and peripheral edema managed by cardiology. Patient is on Synthroid replacement therapy, and multiple cardiac medications including aspirin 81 mg a day.  Patient has mild chronic constipation with incomplete rectal emptying, but denies rectal bleeding. Last colonoscopy was 4 years ago. She has long-term recurrent acid reflux despite Prilosec 40 mg a day, and over the last several months has had progressive solid food dysphagia in her mid substernal area. She denies any specific hepatobiliary complaints otherwise. Hematocrit recently was 41 with a ferritin of 10. She has been started on oral iron replacement therapy. Family history is remarkable for multiple GI complaints and multiple family members. She denies abuse of alcohol, cigarettes, or NSAIDs. The patient does have a history of multiple drug allergies.  I have reviewed this patient's present history, medical and surgical past history, allergies and medications.    Allergies  Allergen Reactions  . Caudal Tray (Lidocaine-Epinephrine)     Edema    . Codeine     REACTION: nausea  . Diovan (Valsartan) Itching  . Doxycycline   . Esomeprazole Magnesium     Headache    . Levofloxacin       Insomnia    . Neomycin-Bacitracin Zn-Polymyx   . Penicillins   . Sulfa Drugs Cross Reactors   . Verapamil     Edema   . Vimovo (Naproxen-Esomeprazole)     Upset stomach   . Zocor (Simvastatin)     Edema, and myalagias    Outpatient Prescriptions Prior to Visit  Medication Sig Dispense Refill  . aspirin (ASPIRIN LOW DOSE) 81 MG EC tablet Take 81 mg by mouth 2 (two) times daily.        . Calcium Carbonate-Vit D-Min (CALCIUM 1200 PO) Take by mouth.        . carvedilol (COREG) 12.5 MG tablet Take 12.5 mg by mouth as directed. Take one tablet by mouth in the morning , then take 1.5 at night      . flecainide (TAMBOCOR) 100 MG tablet TAKE 1 TABLET EVERY 12 HOURS  60 tablet  4  . levothyroxine (SYNTHROID, LEVOTHROID) 100 MCG tablet Take 100 mcg by mouth.       Marland Kitchen LORazepam (ATIVAN) 0.5 MG tablet Take 0.5 mg by mouth as needed.        . methotrexate (RHEUMATREX) 2.5 MG tablet Take 5 mg by mouth once a week. Caution:Chemotherapy. Protect from light.       . Multiple Vitamin (MULTIVITAMIN) tablet Take 1 tablet by mouth daily.        . Multiple Vitamins-Minerals (EYE SUPPORT PO) Take by mouth. Take 2 capsules daily         . Multiple Vitamins-Minerals (HAIR VITAMINS PO) Take by mouth.        . NON FORMULARY Take 3.75 mg by  mouth daily. Biestrogen      . omega-3 acid ethyl esters (LOVAZA) 1 G capsule Take 2 g by mouth 2 (two) times daily.       Marland Kitchen omeprazole (PRILOSEC) 40 MG capsule Take 40 mg by mouth daily.        Marland Kitchen PARoxetine (PAXIL) 10 MG tablet Take 10 mg by mouth every morning.        . rosuvastatin (CRESTOR) 10 MG tablet Take 10 mg by mouth daily.        Marland Kitchen torsemide (DEMADEX) 20 MG tablet Take 20 mg by mouth daily.      Marland Kitchen amiodarone (PACERONE) 200 MG tablet Take 1 tablet (200 mg total) by mouth daily.  30 tablet  11  . docusate sodium (COLACE) 100 MG capsule Take 100 mg by mouth daily.         Past Medical History  Diagnosis Date  . Impaired glucose tolerance   . Psoriasis   .  IBS (irritable bowel syndrome)     vs diarrhea vs abd. fullness   . Chronic anxiety   . Uterine prolapse   . Bronchial spasms   . Edema   . PVC's (premature ventricular contractions)   . Fatigue   . Atrial fibrillation   . Hypertension   . Esophageal stricture   . Elevated blood sugar   . Diverticulosis   . Rectocele, female   . History of bladder repair surgery   . Hx of vaginal hysterectomy   . Vaginal prolapse 1998  . Uterine prolapse   . Ventricular tachycardia   . Hypothyroidism   . Psoriasis   . Premature ventricular contractions   . Ventricular fibrillation    Past Surgical History  Procedure Date  . Vaginal hysterectomy     prolapse   . Bladder suspension   . Rectocele repair   . Transthoracic echocardiogram 2008   History   Social History  . Marital Status: Married    Spouse Name: N/A    Number of Children: 2  . Years of Education: N/A   Occupational History  . ADMINISTRATION    Social History Main Topics  . Smoking status: Never Smoker   . Smokeless tobacco: Never Used  . Alcohol Use: Yes  . Drug Use: No  . Sexually Active: None   Other Topics Concern  . None   Social History Narrative  . None   Family History  Problem Relation Age of Onset  . Breast cancer Sister   . Ovarian cancer Sister   . Uterine cancer Paternal Aunt   . Crohn's disease Other     neice  . Diabetes Mother   . Diabetes Maternal Grandmother   . Diabetes Sister   . Heart disease Mother   . Heart disease Father   . Heart disease Brother   . Heart disease Brother       ROS: The remainder of the 10 point ROS is negative     Physical Exam: Blood pressure 162/80, pulse 56 and irregular, weight 174 pounds with a BMI 29.91. General well developed well nourished patient in no acute distress, appearing her stated age Eyes PERRLA, no icterus, fundoscopic exam per opthamologist Skin no lesions noted Neck supple, no adenopathy, no thyroid enlargement, no  tenderness Chest clear to percussion and auscultation Heart no significant murmurs, gallops or rubs noted Abdomen no hepatosplenomegaly masses or tenderness, BS normal.  Rectal inspection normal no fissures, or fistulae noted.  No masses or tenderness on digital  exam. Stool guaiac ++ Extremities no acute joint lesions, edema, phlebitis or evidence of cellulitis. Neurologic patient oriented x 3, cranial nerves intact, no focal neurologic deficits noted. Psychological mental status normal and normal affect.  Assessment and plan: This patient has multiple diagnoses and multiple problems. Her psoriasis seems to be fairly well controlled on weekly methotrexate, and she is followed closely by dermatology at [facility]. She has chronic GERD despite PPI therapy, and has progressive solid food dysphagia, rule out Barrett's mucosa with associated stricturing. She also needs followup colonoscopy per her guaiac positive stools and iron deficiency. Her fatigue and proximal muscle pain and weakness suggest possible polymyalgia rheumatica. I have ordered sedimentation rate, B12, folate, and celiac profile. For her constipation, I have prescribed Linzess 145 mg a day as tolerated. Depending on her her workup, she may need followup liver biopsy because of continued methotrexate use. There is no history of chronic alcohol abuse. On exam I cannot appreciate stigmata of chronic  liver disease, encephalopathy, or ascites. I reviewed a antireflux regime with her in detail. Otherwise she is to continue medications as listed and reviewed. Please copy this note to her multiple physicians. Encounter Diagnoses  Name Primary?  Marland Kitchen Anemia Yes  . Heme positive stool   . Abdominal pain

## 2011-07-21 NOTE — Patient Instructions (Signed)
Your procedure has been scheduled for 08/11/2011, please follow the seperate instructions.  Please go to the basement today for your labs.  Your prescription(s) have been sent to you pharmacy.  Take Linzess once a day, first thing in the morning, samples given if they work well call back for a rx.

## 2011-07-24 LAB — CELIAC PANEL 10
Endomysial Screen: NEGATIVE
Gliadin IgA: 6.6 U/mL (ref <20)
Gliadin IgG: 5.4 U/mL (ref <20)
IgA: 186 mg/dL (ref 69–380)
Tissue Transglut Ab: 7 U/mL (ref <20)
Tissue Transglutaminase Ab, IgA: 5 U/mL (ref <20)

## 2011-07-25 ENCOUNTER — Encounter: Payer: Self-pay | Admitting: Internal Medicine

## 2011-07-25 ENCOUNTER — Ambulatory Visit (INDEPENDENT_AMBULATORY_CARE_PROVIDER_SITE_OTHER): Payer: BC Managed Care – PPO | Admitting: Internal Medicine

## 2011-07-25 ENCOUNTER — Other Ambulatory Visit: Payer: Self-pay | Admitting: Internal Medicine

## 2011-07-25 VITALS — BP 166/77 | HR 54 | Ht 64.0 in | Wt 173.0 lb

## 2011-07-25 DIAGNOSIS — Z950 Presence of cardiac pacemaker: Secondary | ICD-10-CM

## 2011-07-25 DIAGNOSIS — I4891 Unspecified atrial fibrillation: Secondary | ICD-10-CM

## 2011-07-25 NOTE — Assessment & Plan Note (Signed)
She still is maintaining sinus rhythm about 92% of the time. We discussed the treatment options. One option would be stopping flecainide and initiating dofetilide. A second option would be catheter ablation. The pros and cons risks and benefits of both approaches have been discussed with the patient. For now, she will undergo endoscopy and have her etiology of her anemia sorted out. Ultimately will discuss catheter ablation of her atrial fibrillation.

## 2011-07-25 NOTE — Patient Instructions (Signed)
Your physician recommends that you schedule a follow-up appointment in: 2  Months with Dr Ladona Ridgel

## 2011-07-25 NOTE — Assessment & Plan Note (Signed)
Her device is working normally. She has approximately 80% atrial fibrillation.

## 2011-07-25 NOTE — Progress Notes (Signed)
HPI   [name] returns today for followup. She is a very pleasant middle-age woman with a history of paroxysmal atrial fibrillation and symptomatic bradycardia, status post permanent pacemaker insertion. She was having recurrent palpitations with increased frequency and we initially attempted initiating amiodarone therapy after discontinuing her flecainide. Immediately, she had worsening tachycardia palpitations and could not take her amiodarone. Her flecainide dose was restarted. Over the last several weeks, she has felt fatigued and fullness. She was anemic and had a heme positive stool. She is pending upper and lower endoscopy. The patient denies syncope. She has not had any melena. No weight loss. Allergies  Allergen Reactions  . Caudal Tray (Lidocaine-Epinephrine)     Edema    . Codeine     REACTION: nausea  . Diovan (Valsartan) Itching  . Doxycycline   . Esomeprazole Magnesium     Headache    . Levofloxacin     Insomnia    . Neomycin-Bacitracin Zn-Polymyx   . Penicillins   . Sulfa Drugs Cross Reactors   . Verapamil     Edema   . Vimovo (Naproxen-Esomeprazole)     Upset stomach   . Zocor (Simvastatin)     Edema, and myalagias      Current Outpatient Prescriptions  Medication Sig Dispense Refill  . aspirin (ASPIRIN LOW DOSE) 81 MG EC tablet Take 81 mg by mouth 2 (two) times daily.        . Calcium Carbonate-Vit D-Min (CALCIUM 1200 PO) Take 1 tablet by mouth daily.       . carvedilol (COREG) 12.5 MG tablet Take one tablet by mouth in the morning , then take 1.5 at night        . Coenzyme Q10 (CO Q 10) 100 MG CAPS Take 1 capsule by mouth daily.      . Fe Fum-FePoly-Vit C-Vit B3 (INTEGRA) 62.5-62.5-40-3 MG CAPS Take 1 tablet by mouth daily.      . flecainide (TAMBOCOR) 100 MG tablet TAKE 1 TABLET EVERY 12 HOURS  60 tablet  4  . levothyroxine (SYNTHROID, LEVOTHROID) 100 MCG tablet Take 100 mcg by mouth.       . Linaclotide (LINZESS) 145 MCG CAPS Take 1 capsule by mouth  daily.  16 capsule  0  . LORazepam (ATIVAN) 0.5 MG tablet Take 0.5 mg by mouth as needed.        . methotrexate (RHEUMATREX) 2.5 MG tablet Take 5 mg by mouth once a week. Caution:Chemotherapy. Protect from light.       . Misc Natural Products (LEG VEIN & CIRCULATION) TABS Take 1 tablet by mouth daily.      Marland Kitchen MOVIPREP 100 G SOLR Take 1 kit (100 g total) by mouth once.  1 kit  0  . Multiple Vitamin (MULTIVITAMIN) tablet Take 1 tablet by mouth daily.        . Multiple Vitamins-Minerals (EYE SUPPORT PO) Take by mouth. Take 2 capsules daily         . Multiple Vitamins-Minerals (HAIR VITAMINS PO) Take by mouth.        . omega-3 acid ethyl esters (LOVAZA) 1 G capsule Take 2 g by mouth 2 (two) times daily.       Marland Kitchen omeprazole (PRILOSEC) 40 MG capsule Take 40 mg by mouth daily.        Marland Kitchen PARoxetine (PAXIL) 10 MG tablet Take 10 mg by mouth every morning.        . Psyllium 28.3 % POWD Take by mouth as  needed.      . rosuvastatin (CRESTOR) 10 MG tablet Take 10 mg by mouth daily.        Marland Kitchen torsemide (DEMADEX) 20 MG tablet Take 20 mg by mouth daily.      Marland Kitchen DISCONTD: carvedilol (COREG) 12.5 MG tablet TAKE 1 TABLET IN THE MORNING & TAKE 2 TABLETS AT NIGHT  90 tablet  2     Past Medical History  Diagnosis Date  . Impaired glucose tolerance   . Psoriasis   . IBS (irritable bowel syndrome)     vs diarrhea vs abd. fullness   . Chronic anxiety   . Uterine prolapse   . Bronchial spasms   . Edema   . PVC's (premature ventricular contractions)   . Fatigue   . Atrial fibrillation   . Hypertension   . Esophageal stricture   . Elevated blood sugar   . Diverticulosis   . Rectocele, female   . History of bladder repair surgery   . Hx of vaginal hysterectomy   . Vaginal prolapse 1998  . Uterine prolapse   . Ventricular tachycardia   . Hypothyroidism   . Psoriasis   . Premature ventricular contractions   . Ventricular fibrillation     ROS:   All systems reviewed and negative except as noted in the  HPI.   Past Surgical History  Procedure Date  . Vaginal hysterectomy     prolapse   . Bladder suspension   . Rectocele repair   . Transthoracic echocardiogram 2008     Family History  Problem Relation Age of Onset  . Breast cancer Sister   . Ovarian cancer Sister   . Uterine cancer Paternal Aunt   . Crohn's disease Other     neice  . Diabetes Mother   . Diabetes Maternal Grandmother   . Diabetes Sister   . Heart disease Mother   . Heart disease Father   . Heart disease Brother   . Heart disease Brother      History   Social History  . Marital Status: Married    Spouse Name: N/A    Number of Children: 2  . Years of Education: N/A   Occupational History  . ADMINISTRATION    Social History Main Topics  . Smoking status: Never Smoker   . Smokeless tobacco: Never Used  . Alcohol Use: Yes  . Drug Use: No  . Sexually Active: Not on file   Other Topics Concern  . Not on file   Social History Narrative  . No narrative on file     BP 166/77  Pulse 54  Ht 5\' 4"  (1.626 m)  Wt 173 lb (78.472 kg)  BMI 29.70 kg/m2  Physical Exam:  Well appearing middle-aged woman, NAD HEENT: Unremarkable Neck:  No JVD, no thyromegally Lungs:  Clear with no wheezes, rales, or rhonchi. HEART:  Regular rate rhythm, no murmurs, no rubs, no clicks Abd:  soft, positive bowel sounds, no organomegally, no rebound, no guarding Ext:  2 plus pulses, no edema, no cyanosis, no clubbing Skin:  No rashes no nodules Neuro:  CN II through XII intact, motor grossly intact  DEVICE  Normal device function.  See PaceArt for details.   Assess/Plan:

## 2011-07-26 ENCOUNTER — Other Ambulatory Visit: Payer: Self-pay | Admitting: Gastroenterology

## 2011-07-26 LAB — PACEMAKER DEVICE OBSERVATION
AL AMPLITUDE: 5 mv
AL IMPEDENCE PM: 450 Ohm
AL THRESHOLD: 1 V
BAMS-0001: 150 {beats}/min
BAMS-0003: 70 {beats}/min
DEVICE MODEL PM: 2285953
RV LEAD AMPLITUDE: 6.4 mv
RV LEAD IMPEDENCE PM: 410 Ohm
RV LEAD THRESHOLD: 1.5 V

## 2011-07-26 MED ORDER — INTEGRA 62.5-62.5-40-3 MG PO CAPS: 1.0000 | ORAL_CAPSULE | Freq: Two times a day (BID) | ORAL | Status: DC

## 2011-08-10 ENCOUNTER — Telehealth: Payer: Self-pay | Admitting: *Deleted

## 2011-08-10 ENCOUNTER — Telehealth: Payer: Self-pay | Admitting: Gastroenterology

## 2011-08-10 NOTE — Telephone Encounter (Signed)
Patient wondered if she should take her iron pills.  She also wanted to know if she should take her bp meds due to the fact that they make her bp drop.  I told her to hold both until after her procedure.   She agreed.  She also wanted to know if she could take her am prep a little earlier due to her 1 hr drive.   I told her that it was fine to take it 1-2 hours earlier if necessary.

## 2011-08-11 ENCOUNTER — Ambulatory Visit (AMBULATORY_SURGERY_CENTER): Payer: BC Managed Care – PPO | Admitting: Gastroenterology

## 2011-08-11 ENCOUNTER — Encounter: Payer: Self-pay | Admitting: Gastroenterology

## 2011-08-11 VITALS — BP 153/79 | HR 55 | Temp 98.1°F | Resp 14 | Ht 64.0 in | Wt 174.0 lb

## 2011-08-11 DIAGNOSIS — R109 Unspecified abdominal pain: Secondary | ICD-10-CM

## 2011-08-11 DIAGNOSIS — K2940 Chronic atrophic gastritis without bleeding: Secondary | ICD-10-CM

## 2011-08-11 DIAGNOSIS — K589 Irritable bowel syndrome without diarrhea: Secondary | ICD-10-CM

## 2011-08-11 DIAGNOSIS — K5730 Diverticulosis of large intestine without perforation or abscess without bleeding: Secondary | ICD-10-CM

## 2011-08-11 DIAGNOSIS — R195 Other fecal abnormalities: Secondary | ICD-10-CM

## 2011-08-11 DIAGNOSIS — K2950 Unspecified chronic gastritis without bleeding: Secondary | ICD-10-CM

## 2011-08-11 DIAGNOSIS — Z1211 Encounter for screening for malignant neoplasm of colon: Secondary | ICD-10-CM

## 2011-08-11 DIAGNOSIS — D649 Anemia, unspecified: Secondary | ICD-10-CM

## 2011-08-11 MED ORDER — SODIUM CHLORIDE 0.9 % IV SOLN: 500.0000 mL | INTRAVENOUS | Status: DC

## 2011-08-11 NOTE — Op Note (Signed)
 [facility] [address] [place], [place]  [address]  COLONOSCOPY PROCEDURE REPORT  PATIENT:  Pamela, [name]  MR#:  [id number] BIRTHDATE:  12/12/1945, 65 yrs. old  GENDER:  female ENDOSCOPIST:  Vania Rea. Jarold Motto, MD, [facility] REF. BY: PROCEDURE DATE:  08/11/2011 PROCEDURE:  Average-risk screening colonoscopy G0121 ASA CLASS:  Class III INDICATIONS:  FOBT positive stool, Iron Deficiency Anemia MEDICATIONS:   propofol (Diprivan) 150 mg IV  DESCRIPTION OF PROCEDURE:   After the risks and benefits and of the procedure were explained, informed consent was obtained. Digital rectal exam was performed and revealed no abnormalities. The LB [id number] endoscope was introduced through the anus and advanced to the cecum, which was identified by both the appendix and ileocecal valve.  The quality of the prep was excellent, using MoviPrep.  The instrument was then slowly withdrawn as the colon was fully examined. <<PROCEDUREIMAGES>>  FINDINGS:  There were mild diverticular changes in left colon. diverticulosis was found.  No polyps or cancers were seen. Retroflexed views in the rectum revealed no abnormalities.    The scope was then withdrawn from the patient and the procedure completed.  COMPLICATIONS:  None ENDOSCOPIC IMPRESSION: 1) Diverticulosis,mild,left sided diverticulosis 2) No polyps or cancers RECOMMENDATIONS: 1) Continue current medications 2) High fiber diet. 3) High fiber diet  REPEAT EXAM:  No  ______________________________ Vania Rea. Jarold Motto, MD, [facility]  CC:  Rudi Heap, MD  n. Rosalie Doctor:   Vania Rea. Frederica Chrestman at 08/11/2011 03:05 PM  Amado Nash, [phone]

## 2011-08-11 NOTE — Patient Instructions (Signed)

## 2011-08-11 NOTE — Op Note (Signed)
[facility] [address] [place], [place]  [address]  ENDOSCOPY PROCEDURE REPORT  PATIENT:  [name], [name]  MR#:  [id number] BIRTHDATE:  28-May-1945, 65 yrs. old  GENDER:  female  ENDOSCOPIST:  Vania Rea. Jarold Motto, MD, [facility] Referred by:  PROCEDURE DATE:  08/11/2011 PROCEDURE:  EGD with biopsy for H. pylori 52841 ASA CLASS:  Class III INDICATIONS:  iron deficiency anemia, hemoccult positive stool  MEDICATIONS:   There was residual sedation effect present from prior procedure., propofol (Diprivan) 100 mg IV TOPICAL ANESTHETIC:  DESCRIPTION OF PROCEDURE:   After the risks and benefits of the procedure were explained, informed consent was obtained.  The LB-GIF Q180 [id number] endoscope was introduced through the mouth and advanced to the second portion of the duodenum.  The instrument was slowly withdrawn as the mucosa was fully examined. <<PROCEDUREIMAGES>>  ULTRASONIC FINDINGS:   A hiatal hernia was found.  CM. HH NOTED. Moderate gastritis was found. LINEAR ERYTHEMA AND SCATTERED ANTRAL EROSIONS,CLO BX. DONE.  Normal duodenal folds were noted. Otherwise normal esophagus.    Retroflexed views revealed no abnormalities.    The scope was then withdrawn from the patient and the procedure completed.  COMPLICATIONS:  None  ENDOSCOPIC IMPRESSION: 1) Moderate gastritis 2) Hiatal hernia 3) Normal duodenal folds 4) Otherwise normal esophagus PROBABLE NSAID DAMAGE,,R/O H.PYLORI. RECOMMENDATIONS: 1) Await pathology results 2) Rx CLO if positive 3) continue PPI  ______________________________ Vania Rea. Jarold Motto, MD, [facility]  CC:  Rudi Heap, MD  n. Rosalie Doctor:   Vania Rea. Rachna Schonberger at 08/11/2011 03:15 PM  Amado Nash, [phone]

## 2011-08-11 NOTE — Progress Notes (Signed)
No complaints noted in the recovery room. Maw   

## 2011-08-14 ENCOUNTER — Telehealth: Payer: Self-pay | Admitting: *Deleted

## 2011-08-14 LAB — HELICOBACTER PYLORI SCREEN-BIOPSY: UREASE: NEGATIVE

## 2011-08-14 NOTE — Telephone Encounter (Signed)
  Follow up Call-  Call back number 08/11/2011  Post procedure Call Back phone  # [phone]  Permission to leave phone message Yes     Patient questions:  Do you have a fever, pain , or abdominal swelling? yes Pain Score  5 *  Have you tolerated food without any problems? yes  Have you been able to return to your normal activities? yes  Do you have any questions about your discharge instructions: Diet   no Medications  no Follow up visit  no  Do you have questions or concerns about your Care? no  Actions: * If pain score is 4 or above: Physician/ provider Notified : Sheryn Bison, MD.  Patient stating since procedure she has had pain under her right rib cage. At present 5/10. Patient denies dyspnea,fever,nausea and vomiting or any bleeding(stomach or rectal). States she is taking her Omeprazole twice daily. Patient stating she has had this pain in the past but never persistent  Note forwarded to Dr. Jarold Motto for any orders.

## 2011-08-14 NOTE — Telephone Encounter (Signed)
DISCUSSED CASE WITH DR. PATTERSON. DR. Jarold Motto SUGGESTING GAS-X FOR THE PATIENT. CALLED THE PATIENT AND INFORMED OF MD SUGGESTION AND TO DRINK WARM FLUIDS. PATIENT VERBALIZED UNDERSTANDING AND AGREEMENT.

## 2011-08-15 ENCOUNTER — Encounter: Payer: Self-pay | Admitting: Gastroenterology

## 2011-08-28 ENCOUNTER — Telehealth: Payer: Self-pay | Admitting: Gastroenterology

## 2011-08-28 NOTE — Telephone Encounter (Signed)
YES X 3.Marland KitchenMarland Kitchen

## 2011-08-28 NOTE — Telephone Encounter (Signed)
Pt called to discuss her labs again; she doesn't understand why she's anemic all of a sudden. She states her insurance will not pay for the Integra and she wonders if something cheaper can be ordered. She also reports she can't tolerate the Linzess; too many watery stools from 1 capsule and she has to work. Also, she has pain under her right rib radiating around to the back. She reports this pain is worse AFTER a BM and got really bad after her procedure- ECL on 08/11/11- but it is better now. I offered pt an appt to discuss these things and she wants your opinion 1st. 1) Do we check labs after 3 months of Integra use? 2) Can she switch to an OTC iron pill?  3) Can she switch to Amitizia for constipation since Linzess cause too many watery stools?  Thanks.

## 2011-08-29 MED ORDER — LUBIPROSTONE 8 MCG PO CAPS: ORAL_CAPSULE | ORAL | Status: DC

## 2011-08-29 NOTE — Telephone Encounter (Signed)
Informed pt I will remind her to have repeat labs done in 3 months to assess Integra/Iron use. She will begin OTC iron d/t the expense of the Integra. I called in Amitiza for her to try and pt will keep [place] informed of the pain she has been having.

## 2011-09-04 ENCOUNTER — Telehealth: Payer: Self-pay | Admitting: Gastroenterology

## 2011-09-04 DIAGNOSIS — R6881 Early satiety: Secondary | ICD-10-CM

## 2011-09-04 DIAGNOSIS — R1011 Right upper quadrant pain: Secondary | ICD-10-CM

## 2011-09-04 DIAGNOSIS — R140 Abdominal distension (gaseous): Secondary | ICD-10-CM

## 2011-09-04 NOTE — Telephone Encounter (Signed)
Notified pt of appt for GB u/s; pt stated understanding.

## 2011-09-04 NOTE — Telephone Encounter (Signed)
Patient still having problems with her stomach.  Would like to speak to the nurse about this.

## 2011-09-04 NOTE — Telephone Encounter (Signed)
Repeat gb ultrasound

## 2011-09-04 NOTE — Telephone Encounter (Signed)
Pt still c/o pain in the upper part of her stomach under her r rib. If she eats, the pain gets worse and it doesn't matter what she eats. She reports her upper abdomen is swollen and she has early satiety; she also chokes easily. She was started on Amitiza d/t the Linzess caused too many stools and she has to work. She has stopped all the advil. She increased Omeprazole to BID over the weekend and doesn't see much difference. ECL on 08/11/11. Please advise, thanks.

## 2011-09-07 ENCOUNTER — Ambulatory Visit (HOSPITAL_COMMUNITY)
Admission: RE | Admit: 2011-09-07 | Discharge: 2011-09-07 | Disposition: A | Discharge status: Home / Self Care | Payer: BC Managed Care – PPO | Source: Ambulatory Visit | Attending: Gastroenterology | Admitting: Gastroenterology

## 2011-09-07 DIAGNOSIS — R1011 Right upper quadrant pain: Secondary | ICD-10-CM

## 2011-09-07 DIAGNOSIS — R141 Gas pain: Secondary | ICD-10-CM | POA: Insufficient documentation

## 2011-09-07 DIAGNOSIS — K7689 Other specified diseases of liver: Secondary | ICD-10-CM | POA: Insufficient documentation

## 2011-09-07 DIAGNOSIS — R140 Abdominal distension (gaseous): Secondary | ICD-10-CM

## 2011-09-07 DIAGNOSIS — R142 Eructation: Secondary | ICD-10-CM | POA: Insufficient documentation

## 2011-09-07 DIAGNOSIS — R6881 Early satiety: Secondary | ICD-10-CM

## 2011-09-08 ENCOUNTER — Telehealth: Payer: Self-pay | Admitting: Physician Assistant

## 2011-09-08 NOTE — Telephone Encounter (Signed)
Patient called regarding taking an extra flecainide tablet this evening. She takes flecainide 100mg  PO BID. She took her evening dose at 5:30 PM per usual, and accidentally took an additional 100mg  tablet at 9:30 PM confusing it for one of her antihypertensives. She states that she has been in atrial fibrillation since noon today and feels mild shortness of breath, but denies lightheadedness, chest pain, tachypalpitations, presyncope or syncope. Advised to continue monitoring her symptoms and to call us or present to the ED if they worsen. Regarding the extra dose, informed the patient that she is safe to resume her usual regimen tomorrow, and that we often give flecainide 300mg . The fact that she took this amount over the course of a day should not lead to toxicity. She understood and agreed.   Jacqulyn Bath, PA-C 09/08/2011 10:26 PM

## 2011-09-13 ENCOUNTER — Other Ambulatory Visit: Payer: Self-pay | Admitting: *Deleted

## 2011-09-13 NOTE — Progress Notes (Signed)
I tried to order GI Cocktail for the pt, but EPIC lists Lidocaine as an allergy. After researching the chart, the allergy did not appear until 06/2010. Pt has not had a procedure since a Liver BX in 10/2009 and Pacer insert 10/2009 and no allergy is listed for lidocaine. Reaction to the drug was edema, so I'm not going to order it. Dr Jarold Motto can we order the cocktail w/o the xylocaine?   Spoke with pt to explain findings and dx per Dr Jarold Motto. Pt states she knows she does better with bid omeprazole, but she needs a new script from her PCP; ordered drug for her. We also discussed her s&s and I offered her a referral to [facility], but told her it usually took months to get an appt. She stated understanding.  Scheduled pt to see Dr Merri Brunette at [facility] on 11/28/11 at 12:30pm; she is on a waiting list. Pt reports her script didn't go through. Had to call Prior Auth at [facility] and pt was approved for 1 year of 60 caps monthly; informed pt.

## 2011-09-14 NOTE — Progress Notes (Signed)
Needs to see PA...mostly functional problems....may need psych referral

## 2011-09-15 MED ORDER — OMEPRAZOLE 40 MG PO CPDR: DELAYED_RELEASE_CAPSULE | ORAL | Status: DC

## 2011-10-04 ENCOUNTER — Encounter: Payer: BC Managed Care – PPO | Admitting: Internal Medicine

## 2011-10-05 NOTE — Telephone Encounter (Signed)
See previous note

## 2011-10-27 ENCOUNTER — Ambulatory Visit (INDEPENDENT_AMBULATORY_CARE_PROVIDER_SITE_OTHER): Payer: BC Managed Care – PPO | Admitting: Internal Medicine

## 2011-10-27 ENCOUNTER — Encounter: Payer: Self-pay | Admitting: Internal Medicine

## 2011-10-27 VITALS — BP 173/76 | HR 61 | Ht 64.0 in | Wt 178.0 lb

## 2011-10-27 DIAGNOSIS — I4891 Unspecified atrial fibrillation: Secondary | ICD-10-CM

## 2011-10-27 DIAGNOSIS — I10 Essential (primary) hypertension: Secondary | ICD-10-CM

## 2011-10-27 DIAGNOSIS — I498 Other specified cardiac arrhythmias: Secondary | ICD-10-CM

## 2011-10-27 DIAGNOSIS — Z950 Presence of cardiac pacemaker: Secondary | ICD-10-CM

## 2011-10-27 LAB — PACEMAKER DEVICE OBSERVATION
AL AMPLITUDE: 2.5 mv
AL IMPEDENCE PM: 512.5 Ohm
AL THRESHOLD: 1 V
ATRIAL PACING PM: 26
BAMS-0001: 150 {beats}/min
BAMS-0003: 70 {beats}/min
BATTERY VOLTAGE: 2.9779 V
DEVICE MODEL PM: 2285953
RV LEAD AMPLITUDE: 6.9 mv
RV LEAD IMPEDENCE PM: 437.5 Ohm
RV LEAD THRESHOLD: 1.5 V
VENTRICULAR PACING PM: 1.8

## 2011-10-27 NOTE — Patient Instructions (Addendum)
Patient to come in for a Tikosyn load  Weston Brass, Pharm D to call patient

## 2011-11-01 ENCOUNTER — Encounter: Payer: Self-pay | Admitting: Internal Medicine

## 2011-11-01 NOTE — Assessment & Plan Note (Signed)
Her atrial fibrillation continues to be a problem. Today I discussed the treatment options. One option would be referral for consideration for catheter ablation. A second option would be trying a different antiarrhythmic drug. Dofetilide with a three-day hospitalization would be my first choice. I discussed this with the patient and she wishes to proceed. She will need to stop her flecainide approximately 3 days prior to initiation of dofetilide.

## 2011-11-01 NOTE — Assessment & Plan Note (Signed)
Her blood pressure is elevated today. She states that it often is when she is in the doctor's office. We will check her blood pressure carefully while she is in the hospital. Additional blood pressure lowering medication will be recommended at that time if her blood pressure remains elevated.

## 2011-11-01 NOTE — Assessment & Plan Note (Signed)
Her [employer]. Jude dual-chamber pacemaker is working normally. Atrial fibrillation is present 13% of the time. We'll plan to recheck in several months.

## 2011-11-01 NOTE — Progress Notes (Signed)
HPI Mrs. Weinert returns today for followup. She is a very pleasant 66 year old woman with a history of hypertension, paroxysmal atrial fibrillation, symptomatic tachycardia bradycardia syndrome, status post permanent pacemaker insertion. In the interim, she notes increasingly frequent episodes of palpitations. Interrogation of her pacemaker today demonstrates that her atrial fibrillation continues to increase in frequency and severity. Despite flecainide, she is in atrial fibrillation approximately 13% of the time. She has not had syncope. She has mild peripheral edema. She admits to some sodium indiscretion. Allergies  Allergen Reactions  . Caudal Tray (Lidocaine-Epinephrine)     Edema    . Codeine     REACTION: nausea  . Diovan (Valsartan) Itching  . Doxycycline     Per pt: unknown  . Esomeprazole Magnesium     Headache    . Levofloxacin     Insomnia    . Neomycin-Bacitracin Zn-Polymyx     rash  . Penicillins     hives  . Sulfa Drugs Cross Reactors   . Verapamil     Edema   . Vimovo (Naproxen-Esomeprazole)     Upset stomach   . Zocor (Simvastatin)     Edema, and myalagias      Current Outpatient Prescriptions  Medication Sig Dispense Refill  . aspirin (ASPIRIN LOW DOSE) 81 MG EC tablet Take 81 mg by mouth 2 (two) times daily.        . Calcium Carbonate-Vit D-Min (CALCIUM 1200 PO) Take 1 tablet by mouth daily.       . carvedilol (COREG) 12.5 MG tablet Take one tablet by mouth in the morning , then take 1.5 at night        . Coenzyme Q10 (CO Q 10) 100 MG CAPS Take 1 capsule by mouth daily.      . flecainide (TAMBOCOR) 100 MG tablet TAKE 1 TABLET EVERY 12 HOURS  60 tablet  4  . levothyroxine (SYNTHROID, LEVOTHROID) 100 MCG tablet Take 100 mcg by mouth.       . Linaclotide (LINZESS) 145 MCG CAPS Take 1 capsule by mouth as needed.      Marland Kitchen LORazepam (ATIVAN) 0.5 MG tablet Take 0.5 mg by mouth as needed.        . lubiprostone (AMITIZA) 8 MCG capsule Take 1 capsule up to two  times daily by mouth with a meal for constipation  60 capsule  3  . methotrexate (RHEUMATREX) 2.5 MG tablet Take 5 mg by mouth once a week. Caution:Chemotherapy. Protect from light.       . Misc Natural Products (LEG VEIN & CIRCULATION) TABS Take 1 tablet by mouth daily.      . Multiple Vitamin (MULTIVITAMIN) tablet Take 1 tablet by mouth daily.        . Multiple Vitamins-Minerals (EYE SUPPORT PO) Take by mouth. Take 2 capsules daily         . Multiple Vitamins-Minerals (HAIR VITAMINS PO) Take by mouth.        . omega-3 acid ethyl esters (LOVAZA) 1 G capsule Take 2 g by mouth 2 (two) times daily.       Marland Kitchen omeprazole (PRILOSEC) 40 MG capsule Take one capsule by mouth twice daily  60 capsule  3  . PARoxetine (PAXIL) 10 MG tablet Take 10 mg by mouth every morning.        . rosuvastatin (CRESTOR) 10 MG tablet Take 10 mg by mouth daily.        Marland Kitchen torsemide (DEMADEX) 20 MG tablet Take 20 mg by  mouth daily.         Past Medical History  Diagnosis Date  . Impaired glucose tolerance   . Psoriasis   . IBS (irritable bowel syndrome)     vs diarrhea vs abd. fullness   . Chronic anxiety   . Uterine prolapse   . Bronchial spasms   . Edema   . PVC's (premature ventricular contractions)   . Fatigue   . Atrial fibrillation   . Hypertension   . Esophageal stricture   . Elevated blood sugar   . Diverticulosis   . Rectocele, female   . History of bladder repair surgery   . Hx of vaginal hysterectomy   . Vaginal prolapse 1998  . Uterine prolapse   . Ventricular tachycardia   . Hypothyroidism   . Psoriasis   . Premature ventricular contractions   . Ventricular fibrillation     ROS:   All systems reviewed and negative except as noted in the HPI.   Past Surgical History  Procedure Date  . Vaginal hysterectomy     prolapse   . Bladder suspension   . Rectocele repair   . Transthoracic echocardiogram 2008     Family History  Problem Relation Age of Onset  . Breast cancer Sister   .  Ovarian cancer Sister   . Uterine cancer Paternal Aunt   . Crohn's disease Other     neice  . Diabetes Mother   . Heart disease Mother   . Diabetes Maternal Grandmother   . Diabetes Sister   . Heart disease Father   . Heart disease Brother   . Heart disease Brother   . Colon cancer Neg Hx   . Stomach cancer Neg Hx      History   Social History  . Marital Status: Married    Spouse Name: N/A    Number of Children: 2  . Years of Education: N/A   Occupational History  . ADMINISTRATION    Social History Main Topics  . Smoking status: Never Smoker   . Smokeless tobacco: Never Used  . Alcohol Use: No  . Drug Use: No  . Sexually Active: Not on file   Other Topics Concern  . Not on file   Social History Narrative  . No narrative on file     BP 173/76  Pulse 61  Ht 5\' 4"  (1.626 m)  Wt 178 lb (80.74 kg)  BMI 30.55 kg/m2  Physical Exam:  Well appearing middle-aged woman, NAD HEENT: Unremarkable Neck:  No JVD, no thyromegally Lungs:  Clear with no wheezes, rales, or rhonchi. Well-healed pacemaker incision. HEART:  Regular rate rhythm, no murmurs, no rubs, no clicks Abd:  soft, positive bowel sounds, no organomegally, no rebound, no guarding Ext:  2 plus pulses, no edema, no cyanosis, no clubbing Skin:  No rashes no nodules Neuro:  CN II through XII intact, motor grossly intact  DEVICE  Normal device function.  See PaceArt for details.   Assess/Plan:

## 2011-11-05 ENCOUNTER — Other Ambulatory Visit: Payer: Self-pay | Admitting: Internal Medicine

## 2011-11-10 ENCOUNTER — Other Ambulatory Visit: Payer: Self-pay | Admitting: Internal Medicine

## 2011-11-10 MED ORDER — CARVEDILOL 12.5 MG PO TABS: ORAL_TABLET | ORAL | Status: DC

## 2011-11-15 ENCOUNTER — Telehealth: Payer: Self-pay | Admitting: Internal Medicine

## 2011-11-15 NOTE — Telephone Encounter (Signed)
Pt rtn your call

## 2011-11-15 NOTE — Telephone Encounter (Signed)
Pt has questions prior to procedure

## 2011-11-15 NOTE — Telephone Encounter (Signed)
LMOMTCB./LV 

## 2011-11-15 NOTE — Telephone Encounter (Signed)
Patient called no answer.LMTC. 

## 2011-11-16 NOTE — Telephone Encounter (Signed)
New Problem:    Patient called with questions about her procedure.  Please call back.

## 2011-11-16 NOTE — Telephone Encounter (Signed)
Spoke with patient.  She is due to come in on Mon for Tikosyn load.  She will get papers from her job and bring in for [place] to fill out for FMLA is she feels like she needs

## 2011-11-20 ENCOUNTER — Other Ambulatory Visit: Payer: Self-pay | Admitting: *Deleted

## 2011-11-20 ENCOUNTER — Telehealth: Payer: Self-pay | Admitting: *Deleted

## 2011-11-20 ENCOUNTER — Ambulatory Visit (INDEPENDENT_AMBULATORY_CARE_PROVIDER_SITE_OTHER): Payer: BC Managed Care – PPO | Admitting: Pharmacist

## 2011-11-20 ENCOUNTER — Encounter: Payer: Self-pay | Admitting: Pharmacist

## 2011-11-20 VITALS — BP 122/84 | Wt 176.5 lb

## 2011-11-20 DIAGNOSIS — I4891 Unspecified atrial fibrillation: Secondary | ICD-10-CM

## 2011-11-20 DIAGNOSIS — D509 Iron deficiency anemia, unspecified: Secondary | ICD-10-CM

## 2011-11-20 LAB — BASIC METABOLIC PANEL
BUN: 12 mg/dL (ref 6–23)
CO2: 31 mEq/L (ref 19–32)
Calcium: 9.6 mg/dL (ref 8.4–10.5)
Chloride: 101 mEq/L (ref 96–112)
Creatinine, Ser: 0.6 mg/dL (ref 0.4–1.2)
GFR: 99.22 mL/min (ref >60.00)
Glucose, Bld: 119 mg/dL — ABNORMAL HIGH (ref 70–99)
Potassium: 3.5 mEq/L (ref 3.5–5.1)
Sodium: 139 mEq/L (ref 135–145)

## 2011-11-20 LAB — MAGNESIUM: Magnesium: 1.8 mg/dL (ref 1.5–2.5)

## 2011-11-20 MED ORDER — POTASSIUM CHLORIDE CRYS ER 20 MEQ PO TBCR: 40.0000 meq | EXTENDED_RELEASE_TABLET | ORAL | Status: DC

## 2011-11-20 NOTE — Progress Notes (Signed)
HPI [name] is a pleasant 66 year old woman presenting today for Tikosyn initiation. She has a history of hypertension, paroxysmal atrial fibrillation, symptomatic tachycardia, bradycardia syndrome, s/p permanent pacemaker insertion. In the interim, she notes frequent episodes of palpitations. Despite flecainide, she was in atrial fibrillation approximately 13% of the time. Due to this the decision to try Tikosyn was made.  Reviewed pt's medication list. She has now stopped the flecainide with the last dose 5 days prior. The only other antiarrhythmic she's tried is amiodarone which was stopped 5 months prior. The only anticoagulation she is on is ASA 81mg  BiD but is not in Afib at this time.  Reviewed medication with patient. She is aware of the importance of compliance and making sure she takes the medication every 12 hours. Also discussed potential side effects, including QTc prolongation.   EKG: Sinus brady. Rate- 57 BPM, QTc- 450 msec - verified by Dr. Ladona Ridgel  Allergies  Allergen Reactions  . Caudal Tray (Lidocaine-Epinephrine)     Edema    . Codeine     REACTION: nausea  . Diovan (Valsartan) Itching  . Doxycycline     Per pt: unknown  . Esomeprazole Magnesium     Headache    . Levofloxacin     Insomnia    . Neomycin-Bacitracin Zn-Polymyx     rash  . Penicillins     hives  . Sulfa Drugs Cross Reactors   . Verapamil     Edema   . Vimovo (Naproxen-Esomeprazole)     Upset stomach   . Zocor (Simvastatin)     Edema, and myalagias     Current Outpatient Prescriptions  Medication Sig Dispense Refill  . aspirin (ASPIRIN LOW DOSE) 81 MG EC tablet Take 81 mg by mouth 2 (two) times daily.        . Calcium Carbonate-Vit D-Min (CALCIUM 1200 PO) Take 1 tablet by mouth daily.       . carvedilol (COREG) 12.5 MG tablet Take 1 tablet by mouth in the AM , and take 1 & 1/2 tablets in the PM  75 tablet  2  . Coenzyme Q10 (CO Q 10) 100 MG CAPS Take 1 capsule by mouth daily.      Marland Kitchen  levothyroxine (SYNTHROID, LEVOTHROID) 100 MCG tablet Take 100 mcg by mouth.       Marland Kitchen LORazepam (ATIVAN) 0.5 MG tablet Take 0.5 mg by mouth as needed.        . methotrexate (RHEUMATREX) 2.5 MG tablet Take 5 mg by mouth once a week. Caution:Chemotherapy. Protect from light.       . Misc Natural Products (LEG VEIN & CIRCULATION) TABS Take 1 tablet by mouth daily.      . Multiple Vitamin (MULTIVITAMIN) tablet Take 1 tablet by mouth daily.        . Multiple Vitamins-Minerals (EYE SUPPORT PO) Take by mouth. Take 2 capsules daily         . Multiple Vitamins-Minerals (HAIR VITAMINS PO) Take by mouth.        . omega-3 acid ethyl esters (LOVAZA) 1 G capsule Take 2 g by mouth 2 (two) times daily.       Marland Kitchen omeprazole (PRILOSEC) 40 MG capsule Take one capsule by mouth twice daily  60 capsule  3  . PARoxetine (PAXIL) 10 MG tablet Take 10 mg by mouth every morning.        . rosuvastatin (CRESTOR) 10 MG tablet Take 10 mg by mouth daily.        Marland Kitchen  flecainide (TAMBOCOR) 100 MG tablet TAKE 1 TABLET EVERY 12 HOURS  60 tablet  4  . Linaclotide (LINZESS) 145 MCG CAPS Take 1 capsule by mouth as needed.      . lubiprostone (AMITIZA) 8 MCG capsule Take 1 capsule up to two times daily by mouth with a meal for constipation  60 capsule  3  . torsemide (DEMADEX) 20 MG tablet Take 20 mg by mouth daily.       BP 122/84  Wt 176 lb 8 oz (80.06 kg)   Assess/Plan:

## 2011-11-20 NOTE — Telephone Encounter (Signed)
lmom for pt to call back. She is scheduled at the coumadin clinic this am.

## 2011-11-21 ENCOUNTER — Inpatient Hospital Stay (HOSPITAL_COMMUNITY)
Admission: AD | Admit: 2011-11-21 | Discharge: 2011-11-24 | DRG: 138 | Disposition: A | Discharge status: Home / Self Care | Payer: BC Managed Care – PPO | Source: Ambulatory Visit | Attending: Internal Medicine | Admitting: Internal Medicine

## 2011-11-21 ENCOUNTER — Encounter (HOSPITAL_COMMUNITY): Payer: Self-pay | Admitting: Nurse Practitioner

## 2011-11-21 ENCOUNTER — Ambulatory Visit (INDEPENDENT_AMBULATORY_CARE_PROVIDER_SITE_OTHER): Payer: BC Managed Care – PPO | Admitting: Pharmacist

## 2011-11-21 DIAGNOSIS — Z7982 Long term (current) use of aspirin: Secondary | ICD-10-CM

## 2011-11-21 DIAGNOSIS — Z79899 Other long term (current) drug therapy: Secondary | ICD-10-CM

## 2011-11-21 DIAGNOSIS — I4891 Unspecified atrial fibrillation: Principal | ICD-10-CM | POA: Diagnosis present

## 2011-11-21 DIAGNOSIS — I498 Other specified cardiac arrhythmias: Secondary | ICD-10-CM | POA: Diagnosis present

## 2011-11-21 DIAGNOSIS — L408 Other psoriasis: Secondary | ICD-10-CM | POA: Diagnosis present

## 2011-11-21 DIAGNOSIS — Z950 Presence of cardiac pacemaker: Secondary | ICD-10-CM | POA: Diagnosis present

## 2011-11-21 DIAGNOSIS — E039 Hypothyroidism, unspecified: Secondary | ICD-10-CM | POA: Diagnosis present

## 2011-11-21 DIAGNOSIS — R7309 Other abnormal glucose: Secondary | ICD-10-CM | POA: Diagnosis present

## 2011-11-21 DIAGNOSIS — K589 Irritable bowel syndrome without diarrhea: Secondary | ICD-10-CM | POA: Diagnosis present

## 2011-11-21 DIAGNOSIS — R0989 Other specified symptoms and signs involving the circulatory and respiratory systems: Secondary | ICD-10-CM

## 2011-11-21 DIAGNOSIS — K5730 Diverticulosis of large intestine without perforation or abscess without bleeding: Secondary | ICD-10-CM | POA: Diagnosis present

## 2011-11-21 DIAGNOSIS — I10 Essential (primary) hypertension: Secondary | ICD-10-CM | POA: Diagnosis present

## 2011-11-21 DIAGNOSIS — E876 Hypokalemia: Secondary | ICD-10-CM | POA: Diagnosis present

## 2011-11-21 DIAGNOSIS — I495 Sick sinus syndrome: Secondary | ICD-10-CM | POA: Diagnosis present

## 2011-11-21 HISTORY — DX: Presence of cardiac pacemaker: Z95.0

## 2011-11-21 HISTORY — DX: Gastro-esophageal reflux disease without esophagitis: K21.9

## 2011-11-21 HISTORY — DX: Adverse effect of unspecified anesthetic, initial encounter: T41.45XA

## 2011-11-21 HISTORY — DX: Family history of other specified conditions: Z84.89

## 2011-11-21 HISTORY — DX: Personal history of other diseases of the digestive system: Z87.19

## 2011-11-21 HISTORY — DX: Other complications of anesthesia, initial encounter: T88.59XA

## 2011-11-21 HISTORY — DX: Myoneural disorder, unspecified: G70.9

## 2011-11-21 HISTORY — DX: Sick sinus syndrome: I49.5

## 2011-11-21 HISTORY — DX: Anemia, unspecified: D64.9

## 2011-11-21 LAB — BASIC METABOLIC PANEL
BUN: 10 mg/dL (ref 6–23)
CO2: 29 mEq/L (ref 19–32)
Calcium: 9.4 mg/dL (ref 8.4–10.5)
Chloride: 107 mEq/L (ref 96–112)
Creatinine, Ser: 0.6 mg/dL (ref 0.4–1.2)
GFR: 99.22 mL/min (ref >60.00)
Glucose, Bld: 113 mg/dL — ABNORMAL HIGH (ref 70–99)
Potassium: 4.7 mEq/L (ref 3.5–5.1)
Sodium: 142 mEq/L (ref 135–145)

## 2011-11-21 LAB — MAGNESIUM: Magnesium: 1.9 mg/dL (ref 1.5–2.5)

## 2011-11-21 MED ORDER — LEVOTHYROXINE SODIUM 100 MCG PO TABS
100.0000 ug | ORAL_TABLET | Freq: Every day | ORAL | Status: DC
  Administered 2011-11-22 – 2011-11-24 (×3): 100 ug via ORAL
  Filled 2011-11-21 (×4): qty 1

## 2011-11-21 MED ORDER — METHOTREXATE 2.5 MG PO TABS: 5.0000 mg | ORAL_TABLET | ORAL | Status: DC

## 2011-11-21 MED ORDER — OMEGA-3-ACID ETHYL ESTERS 1 G PO CAPS
2.0000 g | ORAL_CAPSULE | Freq: Two times a day (BID) | ORAL | Status: DC
  Administered 2011-11-21 – 2011-11-23 (×4): 2 g via ORAL
  Administered 2011-11-23 – 2011-11-24 (×2): 1 g via ORAL
  Filled 2011-11-21 (×10): qty 2

## 2011-11-21 MED ORDER — ATORVASTATIN CALCIUM 20 MG PO TABS
20.0000 mg | ORAL_TABLET | Freq: Every day | ORAL | Status: DC
  Administered 2011-11-21 – 2011-11-23 (×3): 20 mg via ORAL
  Filled 2011-11-21 (×4): qty 1

## 2011-11-21 MED ORDER — SODIUM CHLORIDE 0.9 % IJ SOLN
3.0000 mL | Freq: Two times a day (BID) | INTRAMUSCULAR | Status: DC
  Administered 2011-11-21 – 2011-11-24 (×5): 3 mL via INTRAVENOUS

## 2011-11-21 MED ORDER — LORAZEPAM 1 MG PO TABS
1.0000 mg | ORAL_TABLET | ORAL | Status: DC | PRN
  Administered 2011-11-21 – 2011-11-23 (×3): 1 mg via ORAL
  Filled 2011-11-21 (×3): qty 1

## 2011-11-21 MED ORDER — DOFETILIDE 500 MCG PO CAPS
500.0000 ug | ORAL_CAPSULE | Freq: Two times a day (BID) | ORAL | Status: DC
  Administered 2011-11-21 – 2011-11-24 (×6): 500 ug via ORAL
  Filled 2011-11-21 (×9): qty 1

## 2011-11-21 MED ORDER — LUBIPROSTONE 8 MCG PO CAPS
8.0000 ug | ORAL_CAPSULE | Freq: Two times a day (BID) | ORAL | Status: DC
  Filled 2011-11-21 (×8): qty 1

## 2011-11-21 MED ORDER — CALCIUM CARBONATE-VITAMIN D 500-200 MG-UNIT PO TABS
1.0000 | ORAL_TABLET | Freq: Every day | ORAL | Status: DC
  Administered 2011-11-21 – 2011-11-24 (×4): 1 via ORAL
  Filled 2011-11-21 (×4): qty 1

## 2011-11-21 MED ORDER — PANTOPRAZOLE SODIUM 40 MG PO TBEC
40.0000 mg | DELAYED_RELEASE_TABLET | Freq: Every day | ORAL | Status: DC
  Administered 2011-11-22 – 2011-11-24 (×3): 40 mg via ORAL
  Filled 2011-11-21 (×3): qty 1

## 2011-11-21 MED ORDER — TORSEMIDE 20 MG PO TABS
20.0000 mg | ORAL_TABLET | Freq: Every day | ORAL | Status: DC
  Administered 2011-11-22 – 2011-11-23 (×2): 20 mg via ORAL
  Filled 2011-11-21 (×5): qty 1

## 2011-11-21 MED ORDER — LORAZEPAM 0.5 MG PO TABS: 0.5000 mg | ORAL_TABLET | ORAL | Status: DC | PRN

## 2011-11-21 MED ORDER — CALCIUM 1200 1200-1000 MG-UNIT PO CHEW: 1.0000 | CHEWABLE_TABLET | Freq: Every day | ORAL | Status: DC

## 2011-11-21 MED ORDER — ASPIRIN 81 MG PO TBEC
81.0000 mg | DELAYED_RELEASE_TABLET | Freq: Every day | ORAL | Status: DC
Start: 2011-11-21 — End: 2011-11-21

## 2011-11-21 MED ORDER — ONE-DAILY MULTI VITAMINS PO TABS: 1.0000 | ORAL_TABLET | Freq: Every day | ORAL | Status: DC

## 2011-11-21 MED ORDER — LINACLOTIDE 145 MCG PO CAPS
145.0000 ug | ORAL_CAPSULE | ORAL | Status: DC | PRN
  Filled 2011-11-21: qty 1

## 2011-11-21 MED ORDER — SODIUM CHLORIDE 0.9 % IV SOLN: 250.0000 mL | INTRAVENOUS | Status: DC | PRN

## 2011-11-21 MED ORDER — CARVEDILOL 12.5 MG PO TABS
12.5000 mg | ORAL_TABLET | Freq: Two times a day (BID) | ORAL | Status: DC
  Administered 2011-11-21 – 2011-11-24 (×5): 12.5 mg via ORAL
  Filled 2011-11-21 (×8): qty 1

## 2011-11-21 MED ORDER — PAROXETINE HCL 10 MG PO TABS
10.0000 mg | ORAL_TABLET | ORAL | Status: DC
  Administered 2011-11-22 – 2011-11-24 (×3): 10 mg via ORAL
  Filled 2011-11-21 (×4): qty 1

## 2011-11-21 MED ORDER — SODIUM CHLORIDE 0.9 % IJ SOLN: 3.0000 mL | INTRAMUSCULAR | Status: DC | PRN

## 2011-11-21 MED ORDER — POTASSIUM CHLORIDE CRYS ER 20 MEQ PO TBCR
40.0000 meq | EXTENDED_RELEASE_TABLET | Freq: Every day | ORAL | Status: DC
  Administered 2011-11-22 – 2011-11-24 (×3): 40 meq via ORAL
  Filled 2011-11-21 (×3): qty 2

## 2011-11-21 MED ORDER — ASPIRIN 81 MG PO CHEW
81.0000 mg | CHEWABLE_TABLET | Freq: Every day | ORAL | Status: DC
  Administered 2011-11-22 – 2011-11-24 (×3): 81 mg via ORAL
  Filled 2011-11-21 (×3): qty 1

## 2011-11-21 NOTE — Assessment & Plan Note (Signed)
Labs reviewed.  K improved to 4.7 and Mg 1.9.  QTc improved today to .  SCr- 0.6.  CrCl- 122 mL/min.  Okay to start patient on BID.  She is aware to report to hospital for admission.

## 2011-11-21 NOTE — Progress Notes (Signed)
HPI  Pt was seen yesterday for Tikosyn admission.  Her labs were reviewed and K was low at 3.5.  Her QTc was slightly prolonged at 449 msec as well.  She was instructed to take of potassium x 2 doses last night and she is here this morning to recheck labs and an EKG.     EKG reviewed by Dr. Ladona Ridgel.  EKG: sinus brady with vent rate of 57 bpm.  QTc- 424 msec.   Current Outpatient Prescriptions on File Prior to Visit  Medication Sig Dispense Refill  . aspirin (ASPIRIN LOW DOSE) 81 MG EC tablet Take 81 mg by mouth 2 (two) times daily.        . Calcium Carbonate-Vit D-Min (CALCIUM 1200 PO) Take 1 tablet by mouth daily.       . carvedilol (COREG) 12.5 MG tablet Take 1 tablet by mouth in the AM , and take 1 & 1/2 tablets in the PM  75 tablet  2  . Coenzyme Q10 (CO Q 10) 100 MG CAPS Take 1 capsule by mouth daily.      Marland Kitchen levothyroxine (SYNTHROID, LEVOTHROID) 100 MCG tablet Take 100 mcg by mouth.       . Linaclotide (LINZESS) 145 MCG CAPS Take 1 capsule by mouth as needed.      Marland Kitchen LORazepam (ATIVAN) 0.5 MG tablet Take 0.5 mg by mouth as needed.        . lubiprostone (AMITIZA) 8 MCG capsule Take 1 capsule up to two times daily by mouth with a meal for constipation  60 capsule  3  . methotrexate (RHEUMATREX) 2.5 MG tablet Take 5 mg by mouth once a week. Caution:Chemotherapy. Protect from light.       . Misc Natural Products (LEG VEIN & CIRCULATION) TABS Take 1 tablet by mouth daily.      . Multiple Vitamin (MULTIVITAMIN) tablet Take 1 tablet by mouth daily.        . Multiple Vitamins-Minerals (EYE SUPPORT PO) Take by mouth. Take 2 capsules daily         . Multiple Vitamins-Minerals (HAIR VITAMINS PO) Take by mouth.        . omega-3 acid ethyl esters (LOVAZA) 1 G capsule Take 2 g by mouth 2 (two) times daily.       Marland Kitchen omeprazole (PRILOSEC) 40 MG capsule Take one capsule by mouth twice daily  60 capsule  3  . PARoxetine (PAXIL) 10 MG tablet Take 10 mg by mouth every morning.        . potassium  chloride SA (KLOR-CON M20) 20 MEQ tablet Take 2 tablets (40 mEq total) by mouth See admin instructions. Take 40 mEq now and an additional 40 mEq before bed  4 tablet  0  . rosuvastatin (CRESTOR) 10 MG tablet Take 10 mg by mouth daily.        Marland Kitchen torsemide (DEMADEX) 20 MG tablet Take 20 mg by mouth daily.

## 2011-11-21 NOTE — H&P (Signed)
Patient ID: [name] MRN: [id number], DOB/AGE: 1945/12/30   Admit date: 11/21/2011   Primary Physician: Rudi Heap, MD Primary Cardiologist: Reece Agar. Ladona Ridgel, MD  Pt. Profile:  66 y/o female with h/o PAF and tachy-brady syndrome who presents for admission for tikosyn loading.  Problem List  Past Medical History  Diagnosis Date  . Impaired glucose tolerance   . Psoriasis   . IBS (irritable bowel syndrome)     vs diarrhea vs abd. fullness   . Chronic anxiety   . Uterine prolapse   . Bronchial spasms   . Atrial fibrillation   . Hypertension   . Esophageal stricture   . Elevated blood sugar   . Diverticulosis   . Rectocele, female   . History of bladder repair surgery   . Hx of vaginal hysterectomy   . Vaginal prolapse 1998  . Uterine prolapse   . Hypothyroidism   . Psoriasis   . Tachy-brady syndrome     a. 08/19/2008 s/p PPM: SJM 2110 [employer]    Past Surgical History  Procedure Date  . Vaginal hysterectomy     prolapse   . Bladder suspension   . Rectocele repair   . Transthoracic echocardiogram 2008    Allergies  Allergies  Allergen Reactions  . Caudal Tray (Lidocaine-Epinephrine)     Edema    . Codeine     REACTION: nausea  . Diovan (Valsartan) Itching  . Doxycycline     Per pt: unknown  . Esomeprazole Magnesium     Headache    . Levofloxacin     Insomnia    . Neomycin-Bacitracin Zn-Polymyx     rash  . Penicillins     hives  . Sulfa Drugs Cross Reactors   . Verapamil     Edema   . Vimovo (Naproxen-Esomeprazole)     Upset stomach   . Zocor (Simvastatin)     Edema, and myalagias    HPI  66 y/o female with the above problem list.  She had been on flecainide for PAF but was recently seen by Dr. Ladona Ridgel in clinic on 9/6, with complaints of palpitations.  Interrogation of her device that day showed increasing frequency of afib with a burden of 13%.  Decision was made to d/c flecainide and electively admit her for Tikosyn loading.  She stopped her  flecainide on 9/25 and was seen in coumadin clinic on 9/30.  Unfortunately, she was hypokalemic and QTc was out @ 449.  K+ was supplemented and she returned to coumadin/Tikosyn clinic this AM.  K+ is now 4.7 and QTc is 423.  She now presents for elective admission.  Home Medications  Prior to Admission medications   Medication Sig Start Date End Date Taking? Authorizing Provider  aspirin (ASPIRIN LOW DOSE) 81 MG EC tablet Take 81 mg by mouth 2 (two) times daily.      Historical Provider, MD  Calcium Carbonate-Vit D-Min (CALCIUM 1200 PO) Take 1 tablet by mouth daily.     Historical Provider, MD  carvedilol (COREG) 12.5 MG tablet Take 1 tablet by mouth in the AM , and take 1 & 1/2 tablets in the PM 11/10/11   Marinus Maw, MD  Coenzyme Q10 (CO Q 10) 100 MG CAPS Take 1 capsule by mouth daily.    Historical Provider, MD  levothyroxine (SYNTHROID, LEVOTHROID) 100 MCG tablet Take 100 mcg by mouth.     Historical Provider, MD  Linaclotide Karlene Einstein) 145 MCG CAPS Take 1 capsule by mouth as  needed. 07/21/11   Mardella Layman, MD  LORazepam (ATIVAN) 0.5 MG tablet Take 0.5 mg by mouth as needed.      Historical Provider, MD  lubiprostone (AMITIZA) 8 MCG capsule Take 1 capsule up to two times daily by mouth with a meal for constipation 08/29/11   Mardella Layman, MD  methotrexate (RHEUMATREX) 2.5 MG tablet Take 5 mg by mouth once a week. Caution:Chemotherapy. Protect from light.     Historical Provider, MD  Misc Natural Products (LEG VEIN & CIRCULATION) TABS Take 1 tablet by mouth daily.    Historical Provider, MD  Multiple Vitamin (MULTIVITAMIN) tablet Take 1 tablet by mouth daily.      Historical Provider, MD  Multiple Vitamins-Minerals (EYE SUPPORT PO) Take by mouth. Take 2 capsules daily       Historical Provider, MD  Multiple Vitamins-Minerals (HAIR VITAMINS PO) Take by mouth.      Historical Provider, MD  omega-3 acid ethyl esters (LOVAZA) 1 G capsule Take 2 g by mouth 2 (two) times daily.      Historical Provider, MD  omeprazole (PRILOSEC) 40 MG capsule Take one capsule by mouth twice daily 09/15/11   Mardella Layman, MD  PARoxetine (PAXIL) 10 MG tablet Take 10 mg by mouth every morning.      Historical Provider, MD  potassium chloride SA (KLOR-CON M20) 20 MEQ tablet Take 2 tablets (40 mEq total) by mouth See admin instructions. Take 40 mEq now and an additional 40 mEq before bed 11/20/11   Marinus Maw, MD  rosuvastatin (CRESTOR) 10 MG tablet Take 10 mg by mouth daily.      Historical Provider, MD  torsemide (DEMADEX) 20 MG tablet Take 20 mg by mouth daily.    Historical Provider, MD   Family History  Family History  Problem Relation Age of Onset  . Breast cancer Sister   . Ovarian cancer Sister   . Uterine cancer Paternal Aunt   . Crohn's disease Other     neice  . Diabetes Mother   . Heart disease Mother   . Diabetes Maternal Grandmother   . Diabetes Sister   . Heart disease Father   . Heart disease Brother   . Heart disease Brother   . Colon cancer Neg Hx   . Stomach cancer Neg Hx     Social History  History   Social History  . Marital Status: Married    Spouse Name: N/A    Number of Children: 2  . Years of Education: N/A   Occupational History  . ADMINISTRATION    Social History Main Topics  . Smoking status: Never Smoker   . Smokeless tobacco: Never Used  . Alcohol Use: No  . Drug Use: No  . Sexually Active: Not on file   Other Topics Concern  . Not on file   Social History Narrative  . No narrative on file    Review of Systems General:  No chills, fever, night sweats or weight changes.  Cardiovascular:  No chest pain, dyspnea on exertion, edema, orthopnea, palpitations, paroxysmal nocturnal dyspnea. Dermatological: No rash, lesions/masses Respiratory: No cough, dyspnea Urologic: No hematuria, dysuria Abdominal:   No nausea, vomiting, diarrhea, bright red blood per rectum, melena, or hematemesis Neurologic:  No visual changes, wkns,  changes in mental status. All other systems reviewed and are otherwise negative except as noted above.  Physical Exam  Blood pressure 145/80, pulse 65, temperature 97.9 F (36.6 C), temperature source Oral, resp. rate  18, SpO2 100.00%.  General: Pleasant, NAD Psych: Normal affect. Neuro: Alert and oriented X 3. Moves all extremities spontaneously. HEENT: Normal  Neck: Supple without bruits or JVD. Lungs:  Resp regular and unlabored, CTA. Heart: RRR no s3, s4, or murmurs. Abdomen: Soft, non-tender, non-distended, BS + x 4.  Extremities: No clubbing, cyanosis or edema. DP/PT/Radials 2+ and equal bilaterally.  Labs  Lab Results  Component Value Date   WBC 8.8 09/29/2009   HGB 13.1 09/29/2009   HCT 38.4 09/29/2009   MCV 89.1 09/29/2009   PLT 266 09/29/2009    Lab 11/21/11 1053  NA 142  K 4.7  CL 107  CO2 29  BUN 10  CREATININE 0.6  CALCIUM 9.4  PROT --  BILITOT --  ALKPHOS --  ALT --  AST --  GLUCOSE 113*   Creat Cl: 119.4 mL/min.  Radiology/Studies  No results found.  ECG  Sinus brady, 57, no acute st/t changes.  QT 440, RR 1080, QTc 423  ASSESSMENT AND PLAN  1.  PAF:  In sinus today.  Admit for tikosyn loading.  QTc 423.  Creat Clearance 119.4.  Plan to start Tikosyn 500 mcg q 12h this evening.  F/u K/Mg in AM.  Cont bb/ASA.  CHADS2 = 1.  2.  HTN:  Cont bb.  Follow.  3.  Hypothyroidism:  Cont synthroid.  4.  Hypokalemia:  Follow and supplement as necessary.   Signed, Nicolasa Ducking, NP 11/21/2011, 2:15 PM As above, patient seen and examined. Patient admitted for tikosyn load. She denies chest pain, dyspnea, palpitations or syncope. QTC 0.423. Serum potassium is 4.7 and magnesium is 1.9. Continue load and follow T. Interval and telemetry. Follow electrolytes. Olga Millers  3:06 PM

## 2011-11-21 NOTE — Progress Notes (Signed)
Pharmacy Consult for Dofetilide (Tikosyn) Iniation  Admit Complaint: 66 y.o. female admitted 11/21/2011 with atrial fibrillation to be initiated on dofetilide.   Assessment:  Patient Exclusion Criteria: If any screening criteria checked as "Yes", then  patient  should NOT receive dofetilide until criteria item is corrected. If "Yes" please indicate correction plan.  YES  NO Patient  Exclusion Criteria Correction Plan  []  [x]  Baseline QTc interval is greater than or equal to 440 msec. IF above YES box checked dofetilide contraindicated unless patient has ICD; then may proceed if QTc 500-550 msec or with known ventricular conduction abnormalities may proceed with QTc 550-600 msec. QTc = 423   []  [x]  Magnesium level is less than 1.8 mEq/l : Last magnesium:  Lab Results  Component Value Date   MG 1.9 11/21/2011          [x]  Potassium level is less than 4 mEq/l : Last potassium:  Lab Results  Component Value Date   K 4.7 11/21/2011         []  [x]  Patient is known or suspected to have a digoxin level greater than 2 ng/ml: No results found for this basename: DIGOXIN      []  [x]  Creatinine clearance less than 20 ml/min (calculated using Cockcroft-Gault, actual body weight and serum creatinine): Estimated Creatinine Clearance: 73 ml/min (by C-G formula based on Cr of 0.6).    []  [x]  Patient has received drugs known to prolong the QT intervals within the last 48 hours(phenothiazines, tricyclics or tetracyclic antidepressants, erythromycin, H-1 antihistamines, cisapride, fluoroquinolones, azithromycin).   []  [x]  Patient received a dose of hydrochlorothiazide (Oretic) alone or in any combination including triamterene (Dyazide, Maxzide) in the last 48 hours.   []  [x]  Patient received a medication known to increase dofetilide plasma concentrations prior to initial dofetilide dose:    Trimethoprim (Primsol, Proloprim) in the last 36 hours   Verapamil (Calan, Verelan) in the last 36 hours or a  sustained release dose in the last 72 hours   Megestrol (Megace) in the last 5 days    Cimetidine (Tagamet) in the last 6 hours   Ketoconazole (Nizoral) in the last 24 hours   Itraconazole (Sporanox) in the last 48 hours    Prochlorperazine (Compazine) in the last 36 hours    []  [x]  Patient is known to have a history of torsades de pointes; congenital or acquired long QT syndromes.   []  [x]  Patient has received a Class 1 antiarrhythmic with less than 2 half-lives since last dose. (Disopyramide, Quinidine, Procainamide, Lidocaine, Mexiletine, Flecainide, Propafenone)   []  [x]  Patient has received amiodarone therapy in the past 3 months or amiodarone level is greater than 0.3 ng/ml.     Ordering provider was confirmed at [email or web address] if they are not listed on the [facility] Authorized Prescribers list.  Goal of Therapy:  Follow renal function, electrolytes, potential drug interactions, and dose adjustment. Provide education and 1 week supply at discharge.  Plan:  1.  Initiate dofetilide based on renal function: Select One Calculated CrCl  Dose q12h  [x]  > 60 ml/min 500 mcg  []  40-60 ml/min 250 mcg  []  20-40 ml/min 125 mcg  2. Follow up QTc after the first 5 doses, renal function, electrolytes (K & Mg) daily x 3 days, dose adjustment, success of initiation and facilitate 1 week discharge supply as clinically indicated.  3. Initiate Tikosyn education video (Call [phone] and ask for video # 116).  4. Place Enrollment Form on the chart for discharge supply  of dofetilide.   Harland German, Pharm D 11/21/2011 3:46 PM

## 2011-11-22 LAB — BASIC METABOLIC PANEL
BUN: 10 mg/dL (ref 6–23)
CO2: 28 mEq/L (ref 19–32)
Calcium: 9.8 mg/dL (ref 8.4–10.5)
Chloride: 109 mEq/L (ref 96–112)
Creatinine, Ser: 0.62 mg/dL (ref 0.50–1.10)
GFR calc Af Amer: >90 mL/min (ref >90)
GFR calc non Af Amer: >90 mL/min (ref >90)
Glucose, Bld: 135 mg/dL — ABNORMAL HIGH (ref 70–99)
Potassium: 3.8 mEq/L (ref 3.5–5.1)
Sodium: 146 mEq/L — ABNORMAL HIGH (ref 135–145)

## 2011-11-22 LAB — MAGNESIUM: Magnesium: 1.9 mg/dL (ref 1.5–2.5)

## 2011-11-22 NOTE — Progress Notes (Addendum)
Patient ID: [name], female   DOB: 03-25-1945, 66 y.o.   MRN: [id number] Subjective:  She denies chest pain or shortness of breath. Minimal palpitations  Objective:  Vital Signs in the last 24 hours: Temp:  [97.3 F (36.3 C)-97.9 F (36.6 C)] 97.3 F (36.3 C) (10/02 0500) Pulse Rate:  [58-68] 68  (10/02 0500) Resp:  [16-18] 18  (10/02 0500) BP: (111-188)/(61-88) 111/77 mmHg (10/02 0500) SpO2:  [94 %-100 %] 94 % (10/02 0500) Weight:  [177 lb 11.1 oz (80.6 kg)] 177 lb 11.1 oz (80.6 kg) (10/01 1531)  Intake/Output from previous day: 10/01 0701 - 10/02 0700 In: -  Out: 201 [Urine:200; Stool:1] Intake/Output from this shift:    Physical Exam: Well appearing middle-aged woman, NAD HEENT: Unremarkable Neck:  No JVD, no thyromegally Lungs:  Clear with no wheezes, rales, or rhonchi. HEART:  Regular rate rhythm, no murmurs, no rubs, no clicks Abd:  Flat, positive bowel sounds, no organomegally, no rebound, no guarding Ext:  2 plus pulses, no edema, no cyanosis, no clubbing Skin:  No rashes no nodules Neuro:  CN II through XII intact, motor grossly intact  Lab Results: No results found for this basename: WBC:2,HGB:2,PLT:2 in the last 72 hours  Basename 11/22/11 0545 11/21/11 1053  NA 146* 142  K 3.8 4.7  CL 109 107  CO2 28 29  GLUCOSE 135* 113*  BUN 10 10  CREATININE 0.62 0.6   No results found for this basename: TROPONINI:2,CK,MB:2 in the last 72 hours Hepatic Function Panel No results found for this basename: PROT,ALBUMIN,AST,ALT,ALKPHOS,BILITOT,BILIDIR,IBILI in the last 72 hours No results found for this basename: CHOL in the last 72 hours No results found for this basename: PROTIME in the last 72 hours  Imaging: No results found.  Cardiac Studies: Telemetry - normal sinus rhythm with atrial pacing as well as atrial fibrillation with a controlled ventricular response  Assessment/Plan:  1. atrial fibrillation 2. admission for dofetilide loading 3.  Hypertension Rec: We'll continue dofetilide. Her QT interval is within acceptable limits. She'll be additional 48 hours in the hospital being observed with antiarrhythmic drug initiation  LOS: 1 day    Buel Ream.D. 11/22/2011, 9:58 AM

## 2011-11-22 NOTE — Progress Notes (Signed)
Notified by monitor tech that pt is going in and out of atrial fibrillation and also pacing. Pacer spike hit on p wave twice, pt asymptomatic, rate controlled, will continue to monitor.

## 2011-11-22 NOTE — Care Management Note (Unsigned)
    Page 1 of 1   11/22/2011     11:51:07 AM   CARE MANAGEMENT NOTE 11/22/2011  Patient:  [name], [name]   Account Number:  [id number]  Date Initiated:  11/22/2011  Documentation initiated by:  SIMMONS,Krina Mraz  Subjective/Objective Assessment:   ADMITTED FOR TIKOSYN INITIATION; LIVES AT HOME WITH HUSBAND IN [place]; WAS IPTA.     Action/Plan:   DISCHARGE PLANNING DISCUSSED AT BEDSIDE.   Anticipated DC Date:  11/23/2011   Anticipated DC Plan:  HOME/SELF CARE      DC Planning Services  CM consult  Medication Assistance      Choice offered to / List presented to:             Status of service:  In process, will continue to follow Medicare Important Message given?   (If response is "NO", the following Medicare IM given date fields will be blank) Date Medicare IM given:   Date Additional Medicare IM given:    Discharge Disposition:    Per UR Regulation:  Reviewed for med. necessity/level of care/duration of stay  If discussed at Long Length of Stay Meetings, dates discussed:    Comments:  11/22/11  1148  Mariha Sleeper SIMMONS RN, BSN [phone] [facility] [place] HAS TIKOSYN IN STOCK PER PHARMACIST; PT GIVEN INSTRUCTIONS ON TIKOSYN RX AND SHE VERBALIZED UNDERSTANDING;  MD- PLEASE WRITE A SEPARATE RX FOR 7 DAYS SUPPLY OF TIKOSYN ASAP SO IT CAN BE SENT TO MAIN PHARMACY. NCM WILL FOLLOW.

## 2011-11-22 NOTE — Progress Notes (Signed)
Pt back in atrial fib, EKG done, MD on call made aware, will continue to monitor.

## 2011-11-22 NOTE — Telephone Encounter (Addendum)
Received lab results from Dr Kathi Der ofc; will leave on Dr Norval Gable desk. Pt still needs iron studies to assess effectiveness of Integra therapy. Sent a note to Dr Ladona Ridgel to ask if we can draw Iron studies.

## 2011-11-22 NOTE — Progress Notes (Signed)
Pt HR got up to 125, but did not sustain, pt asymptomatic. Will continue to monitor. HR back down in the 80's-90's.

## 2011-11-23 LAB — BASIC METABOLIC PANEL
BUN: 16 mg/dL (ref 6–23)
CO2: 28 mEq/L (ref 19–32)
Calcium: 10 mg/dL (ref 8.4–10.5)
Chloride: 103 mEq/L (ref 96–112)
Creatinine, Ser: 0.69 mg/dL (ref 0.50–1.10)
GFR calc Af Amer: >90 mL/min (ref >90)
GFR calc non Af Amer: >90 mL/min (ref >90)
Glucose, Bld: 140 mg/dL — ABNORMAL HIGH (ref 70–99)
Potassium: 4.1 mEq/L (ref 3.5–5.1)
Sodium: 140 mEq/L (ref 135–145)

## 2011-11-23 LAB — MAGNESIUM: Magnesium: 1.9 mg/dL (ref 1.5–2.5)

## 2011-11-23 MED ORDER — MAGNESIUM OXIDE 400 (241.3 MG) MG PO TABS
400.0000 mg | ORAL_TABLET | Freq: Once | ORAL | Status: AC
  Administered 2011-11-23: 400 mg via ORAL
  Filled 2011-11-23: qty 1

## 2011-11-23 NOTE — Progress Notes (Signed)
   SUBJECTIVE:  No pain.  No SOB   PHYSICAL EXAM Filed Vitals:   11/22/11 0500 11/22/11 1300 11/22/11 2210 11/23/11 0501  BP: 111/77 114/75 153/76 127/63  Pulse: 68 54 57 56  Temp: 97.3 F (36.3 C) 97.6 F (36.4 C) 97.6 F (36.4 C) 97.6 F (36.4 C)  TempSrc: Oral Oral Oral Oral  Resp: 18 18 18 18   Height:      Weight:      SpO2: 94% 96% 95% 96%   General:  No distress Lungs:  Clear Heart:  RRR Abdomen:  Positive bowel sounds, no rebound no guarding Extremities:  No edema  LABS: No results found for this basename: CKTOTAL, CKMB, CKMBINDEX, TROPONINI   Results for orders placed during the hospital encounter of 11/21/11 (from the past 24 hour(s))  MAGNESIUM     Status: Normal   Collection Time   11/23/11  5:00 AM      Component Value Range   Magnesium 1.9  1.5 - 2.5 mg/dL  BASIC METABOLIC PANEL     Status: Abnormal   Collection Time   11/23/11  5:00 AM      Component Value Range   Sodium 140  135 - 145 mEq/L   Potassium 4.1  3.5 - 5.1 mEq/L   Chloride 103  96 - 112 mEq/L   CO2 28  19 - 32 mEq/L   Glucose, Bld 140 (*) 70 - 99 mg/dL   BUN 16  6 - 23 mg/dL   Creatinine, Ser 4.09  0.50 - 1.10 mg/dL   Calcium 81.1  8.4 - 91.4 mg/dL   GFR calc non Af Amer >90  >90 mL/min   GFR calc Af Amer >90  >90 mL/min    Intake/Output Summary (Last 24 hours) at 11/23/11 0824 Last data filed at 11/22/11 1716  Gross per 24 hour  Intake    600 ml  Output   3251 ml  Net  -2651 ml    ASSESSMENT AND PLAN:  1. PAF: In sinus today. She has now received four doses of Tikosyn.  I will give more magnesium today.  QTc by my review today unchanged from yesterday (480).    2. HTN: Cont bb. Follow.   3. Hypothyroidism: Cont synthroid.   TSH within normal limits earlier this year.   4. Hypokalemia: Potassium OK today.     Fayrene Fearing [facility] 11/23/2011 8:24 AM

## 2011-11-23 NOTE — Progress Notes (Addendum)
Pt on Tikosyn loading 500 mcg bid, EKG obtained this PM after third dose given, On call physician, Mclean, D.S. MD notified of EKG -QT/QTC reading, new orders received to hold AM dose until DR G. Taylor  rounds in AM. Pt is in stable condition with no sign of distress, will continue to monitor.

## 2011-11-24 LAB — BASIC METABOLIC PANEL
BUN: 16 mg/dL (ref 6–23)
CO2: 30 mEq/L (ref 19–32)
Calcium: 10.4 mg/dL (ref 8.4–10.5)
Chloride: 103 mEq/L (ref 96–112)
Creatinine, Ser: 0.7 mg/dL (ref 0.50–1.10)
GFR calc Af Amer: >90 mL/min (ref >90)
GFR calc non Af Amer: 90 mL/min — ABNORMAL LOW (ref >90)
Glucose, Bld: 152 mg/dL — ABNORMAL HIGH (ref 70–99)
Potassium: 4.5 mEq/L (ref 3.5–5.1)
Sodium: 142 mEq/L (ref 135–145)

## 2011-11-24 LAB — MAGNESIUM: Magnesium: 2 mg/dL (ref 1.5–2.5)

## 2011-11-24 MED ORDER — DOFETILIDE 500 MCG PO CAPS: 500.0000 ug | ORAL_CAPSULE | Freq: Two times a day (BID) | ORAL | Status: DC

## 2011-11-24 MED ORDER — MAGNESIUM OXIDE 400 MG PO TABS: 400.0000 mg | ORAL_TABLET | Freq: Every day | ORAL | Status: DC

## 2011-11-24 NOTE — Discharge Summary (Signed)
CARDIOLOGY DISCHARGE SUMMARY   Patient ID: [name] MRN: [id number] DOB/AGE: 64/06/1945 64 y.o.  Admit date: 11/21/2011 Discharge date: 11/24/2011  Primary Discharge Diagnosis:  PAF  Secondary Discharge Diagnosis:  Active Problems:  HYPOTHYROIDISM  HYPERTENSION  Atrial fibrillation  BRADYCARDIA  Cardiac pacemaker in situ  Hospital Course: [name] is a 66 year old female with a history of PAF. She had been on Flecainide and was seen by Dr Ladona Ridgel. He interrogated her pacemaker and determined her atrial fib episodes were increasing in frequency. Tikosyn was felt to be the best option. She d/c'd the Flecainide and came to the office for labs and then to the hospital on 11/21/2011 for Tikosyn loading.   On admission, she was in sinus rhythm. She was continued on her BP meds and her thyroid supplementation. She had her potassium and her magnesium checked daily. They both required supplementation which will be continued. Her QTc was checked daily and she had some minimal increase, but tolerated the medication well. No dose change was required.   On 11/24/2011, [name] was seen by Dr Antoine Poche. She was tolerating the Tikosyn well and considered stable for discharge, to follow up in the office.  Labs:  Lab Results  Component Value Date   WBC 8.8 09/29/2009   HGB 13.1 09/29/2009   HCT 38.4 09/29/2009   MCV 89.1 09/29/2009   PLT 266 09/29/2009    Lab 11/24/11 0440  NA 142  K 4.5  CL 103  CO2 30  BUN 16  CREATININE 0.70  CALCIUM 10.4  PROT --  BILITOT --  ALKPHOS --  ALT --  AST --  GLUCOSE 152*   EKG: 24-Nov-2011 12:11:55 [facility] ROUTINE RECORD Sinus bradycardia Nonspecific ST and T wave abnormality Prolonged QT Abnormal ECG 61mm/s 69mm/mV 100Hz  8.0.1 12SL 241 HD CID: 1 Referred by: Unconfirmed Vent. rate 59 BPM PR interval 196 Pamela QRS duration 90 Pamela QT/QTc 506/500 Pamela - measured manually at 480 Pamela, no dose change. P-R-T axes 36 11 96  FOLLOW UP  PLANS AND APPOINTMENTS Allergies  Allergen Reactions  . Caudal Tray (Lidocaine-Epinephrine)     Edema    . Codeine     REACTION: nausea  . Diovan (Valsartan) Itching  . Doxycycline     Per pt: unknown  . Esomeprazole Magnesium     Headache    . Levofloxacin     Insomnia    . Neomycin-Bacitracin Zn-Polymyx     rash  . Penicillins     hives  . Sulfa Drugs Cross Reactors   . Verapamil     Edema   . Vimovo (Naproxen-Esomeprazole)     Upset stomach   . Zocor (Simvastatin)     Edema, and myalagias      Medication List     As of 11/24/2011  4:23 PM    TAKE these medications         ARTIFICIAL TEAR OP   Apply 1 drop to eye 2 (two) times daily as needed. For dry eyes      ASPIRIN LOW DOSE 81 MG EC tablet   Generic drug: aspirin   Take 81 mg by mouth 2 (two) times daily.      CALCIUM 1200 PO   Take 1 tablet by mouth daily.      carvedilol 12.5 MG tablet   Commonly known as: COREG   Take 12.5-18.75 mg by mouth 2 (two) times daily with a meal. Take 1 tablet in  AM and 1&1/2 tablet in PM      Co Q 10 100 MG Caps   Take 1 capsule by mouth daily.      dofetilide 500 MCG capsule   Commonly known as: TIKOSYN   Take 1 capsule (500 mcg total) by mouth every 12 (twelve) hours.      dofetilide 500 MCG capsule   Commonly known as: TIKOSYN   Take 1 capsule (500 mcg total) by mouth 2 (two) times daily.      LEG VEIN & CIRCULATION Tabs   Take 1 tablet by mouth daily.      levothyroxine 100 MCG tablet   Commonly known as: SYNTHROID, LEVOTHROID   Take 100 mcg by mouth daily.      LINZESS 145 MCG Caps   Generic drug: Linaclotide   Take 145 mcg by mouth daily as needed. For constipation      LORazepam 0.5 MG tablet   Commonly known as: ATIVAN   Take 1 mg by mouth at bedtime as needed. For sleep      magnesium oxide 400 MG tablet   Commonly known as: MAG-OX   Take 1 tablet (400 mg total) by mouth daily.      methotrexate 2.5 MG tablet   Commonly known as: RHEUMATREX    Take 5 mg by mouth once a week. Caution:Chemotherapy. Protect from light.      multivitamin tablet   Take 1 tablet by mouth daily.      multivitamin-lutein Caps   Take 1 capsule by mouth 2 (two) times daily.      HAIR/SKIN/NAILS PO   Take 1 tablet by mouth every evening.      omega-3 acid ethyl esters 1 G capsule   Commonly known as: LOVAZA   Take 1 g by mouth 2 (two) times daily.      omeprazole 40 MG capsule   Commonly known as: PRILOSEC   Take 40 mg by mouth 2 (two) times daily.      PARoxetine 10 MG tablet   Commonly known as: PAXIL   Take 10 mg by mouth every evening.      rosuvastatin 10 MG tablet   Commonly known as: CRESTOR   Take 10 mg by mouth daily.      torsemide 20 MG tablet   Commonly known as: DEMADEX   Take 20 mg by mouth daily.            Discharge Orders    Future Appointments: Provider: Department: Dept Phone: Center:   12/04/2011 2:00 PM Mariane Masters, PHARMD [facility] [phone] None     Future Orders Please Complete By Expires   Diet - low sodium heart healthy      Increase activity slowly        Follow-up Information    Follow up with Weston Brass, Pharmd, PHARMD. On 12/04/2011. (2:00 pm)    Contact information:   [address] - [place] [address] [phone]          BRING ALL MEDICATIONS WITH YOU TO FOLLOW UP APPOINTMENTS  Time spent with patient to include physician time: 35 min Signed: Theodore Demark 11/24/2011, 4:23 PM Co-Sign MD  Patient seen and examined.  Discharge planning as outlined above as recorded on the my rounding note today.Fayrene Fearing [facility]   11/23/11

## 2011-11-24 NOTE — Telephone Encounter (Signed)
Received lab results from Dr Moore's ofc; will leave on Dr Patterson's desk. Pt still needs iron studies to assess effectiveness of Integra therapy. Sent a note to Dr Taylor to ask if we can draw Iron studies. 

## 2011-11-24 NOTE — Progress Notes (Signed)
    SUBJECTIVE: Atrial fib last night. She denies any chest pain or SOB.      PHYSICAL EXAM Filed Vitals:   11/23/11 1447 11/23/11 1619 11/23/11 2126 11/24/11 0500  BP: 143/80 145/72 128/87 119/69  Pulse: 68 118 73 70  Temp: 97.6 F (36.4 C)  98.2 F (36.8 C) 97.7 F (36.5 C)  TempSrc: Oral  Oral Oral  Resp: 19  16 17   Height:      Weight:      SpO2: 96%  95% 94%   General:  No distress Lungs:  Clear Heart:  RRR Abdomen:  Positive bowel sounds, no rebound no guarding Extremities:  No edema  LABS: No results found for this basename: CKTOTAL,  CKMB,  CKMBINDEX,  TROPONINI   Results for orders placed during the hospital encounter of 11/21/11 (from the past 24 hour(s))  MAGNESIUM     Status: Normal   Collection Time   11/24/11  4:40 AM      Component Value Range   Magnesium 2.0  1.5 - 2.5 mg/dL  BASIC METABOLIC PANEL     Status: Abnormal   Collection Time   11/24/11  4:40 AM      Component Value Range   Sodium 142  135 - 145 mEq/L   Potassium 4.5  3.5 - 5.1 mEq/L   Chloride 103  96 - 112 mEq/L   CO2 30  19 - 32 mEq/L   Glucose, Bld 152 (*) 70 - 99 mg/dL   BUN 16  6 - 23 mg/dL   Creatinine, Ser 1.61  0.50 - 1.10 mg/dL   Calcium 09.6  8.4 - 04.5 mg/dL   GFR calc non Af Amer 90 (*) >90 mL/min   GFR calc Af Amer >90  >90 mL/min    Intake/Output Summary (Last 24 hours) at 11/24/11 0847 Last data filed at 11/23/11 1710  Gross per 24 hour  Intake    600 ml  Output      0 ml  Net    600 ml    ASSESSMENT AND PLAN:  1. PAF: Her Tikosyn was not yet given today as the QT looked longer on an EKG last night.  However, I repeated an EKG this am and note that the QT is 480.  This is less than (or unchanged from) yesterday.  She can continue with the 500 mcg dose.    2. HTN: Cont bb. Fo  3. Hypothyroidism: Cont synthroid.   TSH within normal limits earlier this year.   4. Hypokalemia: Potassium OK today. I supplemented the magnesium yesterday and it is up to 2.0.     Disposition.  Home later today.  She will need a repeat BMET and magnesium and EKG in one week.  She should go home with magnesium supplement.      Fayrene Fearing [facility] 11/24/2011 8:47 AM

## 2011-11-25 ENCOUNTER — Encounter: Payer: Self-pay | Admitting: Internal Medicine

## 2011-11-27 ENCOUNTER — Telehealth: Payer: Self-pay | Admitting: Internal Medicine

## 2011-11-27 NOTE — Telephone Encounter (Signed)
plz return call to patient on cell [phone] regarding questions about Tikosyn,  A-Fib and Med dosages

## 2011-11-27 NOTE — Telephone Encounter (Signed)
Spoke with pt, aware she does not need potassium at this time

## 2011-11-28 ENCOUNTER — Telehealth: Payer: Self-pay | Admitting: Pharmacist

## 2011-11-28 ENCOUNTER — Encounter: Payer: Self-pay | Admitting: Internal Medicine

## 2011-11-28 NOTE — Telephone Encounter (Signed)
Pt called to report she is having problems with the Tikosyn.  She states she has been in afib more since she started Tikosyn than before she went to the hospital.  States she has more afib for an hour or so after her dose of Tikosyn then it will resolve before her next dose is due. She is also having problems filling it because her insurance states she must use a specialty pharmacy and she does not want to do this.  She wants to stop the medication.  Have asked patient to continue the Tikosyn for now and will discuss with Dr. Ladona Ridgel today.

## 2011-11-28 NOTE — Telephone Encounter (Signed)
F/u   Patient would like to speak with Dr. Ladona Ridgel nurse about this matter, as she cannot take the medicine and she doesn't feel good.

## 2011-11-28 NOTE — Telephone Encounter (Signed)
Spoke with patient and let her know to stop the Tikosyn and take nothing for 2 days  She will restart the Flecainide on Fri and come in to see Dr Ladona Ridgel at 9:45   Patient aware. She is very frustrated with the disease process of afib

## 2011-11-28 NOTE — Telephone Encounter (Signed)
Spoke with patient yesterday and she is still having afib.  Will discuss with Dr Ladona Ridgel today and let her know our.  Recommendations.  Patient has called today concerning the same issue.  See Kennon Rounds Putt's note.

## 2011-11-29 ENCOUNTER — Telehealth: Payer: Self-pay | Admitting: Internal Medicine

## 2011-11-29 NOTE — Telephone Encounter (Signed)
New Problem:    Patient called in confused about her Tikosyn.  Please call back.

## 2011-11-29 NOTE — Telephone Encounter (Signed)
Spoke with patient and let her know again to stop Tikosyn and restart Flecainide on Friday morning and keep her follow up with Dr Ladona Ridgel [place]

## 2011-12-01 ENCOUNTER — Ambulatory Visit (INDEPENDENT_AMBULATORY_CARE_PROVIDER_SITE_OTHER): Payer: BC Managed Care – PPO | Admitting: Internal Medicine

## 2011-12-01 ENCOUNTER — Encounter: Payer: Self-pay | Admitting: *Deleted

## 2011-12-01 ENCOUNTER — Encounter: Payer: Self-pay | Admitting: Internal Medicine

## 2011-12-01 VITALS — BP 128/80 | Ht 64.0 in | Wt 173.0 lb

## 2011-12-01 DIAGNOSIS — I4891 Unspecified atrial fibrillation: Secondary | ICD-10-CM

## 2011-12-01 DIAGNOSIS — Z950 Presence of cardiac pacemaker: Secondary | ICD-10-CM

## 2011-12-01 LAB — PACEMAKER DEVICE OBSERVATION
AL AMPLITUDE: 3.4 mv
BAMS-0001: 150 {beats}/min
BAMS-0003: 70 {beats}/min
DEVICE MODEL PM: 2285953
RV LEAD AMPLITUDE: 7.8 mv

## 2011-12-01 NOTE — Patient Instructions (Addendum)
Your physician recommends that you schedule a follow-up appointment in: 3 months with Dr Ladona Ridgel   Your physician has recommended you make the following change in your medication:  1) Stop Flecainide   If your heart goes out of rhythm take an extra Carvedilol

## 2011-12-01 NOTE — Progress Notes (Signed)
HPI [name] returns today for followup. She was previously admitted to the hospital for initiation of dofetilide therapy. Since he is been discharged, she has been in and out of atrial fibrillation. Her symptoms have actually worsened. 2 days ago, she stopped dofetilide and her symptoms have improved. She denies chest pain or shortness of breath currently. She denies syncope or near-syncope. Allergies  Allergen Reactions  . Caudal Tray (Lidocaine-Epinephrine)     Edema    . Codeine     REACTION: nausea  . Diovan (Valsartan) Itching  . Doxycycline     Per pt: unknown  . Esomeprazole Magnesium     Headache    . Levofloxacin     Insomnia    . Neomycin-Bacitracin Zn-Polymyx     rash  . Penicillins     hives  . Sulfa Drugs Cross Reactors   . Verapamil     Edema   . Vimovo (Naproxen-Esomeprazole)     Upset stomach   . Zocor (Simvastatin)     Edema, and myalagias      Current Outpatient Prescriptions  Medication Sig Dispense Refill  . AMITIZA 8 MCG capsule as needed.      . ARTIFICIAL TEAR OP Apply 1 drop to eye 2 (two) times daily as needed. For dry eyes      . aspirin (ASPIRIN LOW DOSE) 81 MG EC tablet Take 81 mg by mouth 2 (two) times daily.        . Calcium Carbonate-Vit D-Min (CALCIUM 1200 PO) Take 1 tablet by mouth daily.       . carvedilol (COREG) 12.5 MG tablet Take 1 tablet in AM and 1&1/2 tablet in PM      . Coenzyme Q10 (CO Q 10) 100 MG CAPS Take 1 capsule by mouth daily.      Marland Kitchen levothyroxine (SYNTHROID, LEVOTHROID) 100 MCG tablet Take 100 mcg by mouth daily.      . Linaclotide (LINZESS) 145 MCG CAPS Take 145 mcg by mouth daily as needed. For constipation      . LORazepam (ATIVAN) 0.5 MG tablet Take 1 mg by mouth at bedtime as needed. For sleep      . magnesium oxide (MAG-OX 400) 400 MG tablet Take 1 tablet (400 mg total) by mouth daily.  30 tablet  11  . methotrexate (RHEUMATREX) 2.5 MG tablet Take 5 mg by mouth once a week. Caution:Chemotherapy. Protect from  light.       . Misc Natural Products (LEG VEIN & CIRCULATION) TABS Take 1 tablet by mouth daily.      . Multiple Vitamin (MULTIVITAMIN) tablet Take 1 tablet by mouth daily.        . Multiple Vitamins-Minerals (HAIR/SKIN/NAILS PO) Take 1 tablet by mouth every evening.      . multivitamin-lutein (OCUVITE-LUTEIN) CAPS Take 1 capsule by mouth 2 (two) times daily.      . NON FORMULARY ALL NATURAL MEMORY VITAMIN --- once daily      . omega-3 acid ethyl esters (LOVAZA) 1 G capsule Take 1 g by mouth 2 (two) times daily.      Marland Kitchen omeprazole (PRILOSEC) 40 MG capsule Take 40 mg by mouth 2 (two) times daily.      Marland Kitchen PARoxetine (PAXIL) 10 MG tablet Take 10 mg by mouth every evening.      . rosuvastatin (CRESTOR) 10 MG tablet Take 10 mg by mouth daily.        Marland Kitchen torsemide (DEMADEX) 20 MG tablet Take 20 mg by  mouth daily.         Past Medical History  Diagnosis Date  . Impaired glucose tolerance   . Psoriasis   . IBS (irritable bowel syndrome)     vs diarrhea vs abd. fullness   . Chronic anxiety   . Uterine prolapse   . Bronchial spasms   . Atrial fibrillation   . Hypertension   . Esophageal stricture   . Elevated blood sugar   . Diverticulosis   . Rectocele, female   . History of bladder repair surgery   . Hx of vaginal hysterectomy   . Vaginal prolapse 1998  . Uterine prolapse   . Hypothyroidism   . Psoriasis   . Tachy-brady syndrome     a. 08/19/2008 s/p PPM: SJM 2110 Accent  . Complication of anesthesia     " I shake real bad "  . Family history of anesthesia complication     Daughter also shakes while waking up  . Pacemaker   . GERD (gastroesophageal reflux disease)   . H/O hiatal hernia   . Neuromuscular disorder     periferal neuropathy  . Anemia     ROS:   All systems reviewed and negative except as noted in the HPI.   Past Surgical History  Procedure Date  . Vaginal hysterectomy     prolapse   . Bladder suspension   . Rectocele repair   . Transthoracic echocardiogram  2008  . Insert / replace / remove pacemaker      Family History  Problem Relation Age of Onset  . Breast cancer Sister   . Ovarian cancer Sister   . Uterine cancer Paternal Aunt   . Crohn's disease Other     neice  . Diabetes Mother   . Heart disease Mother   . Diabetes Maternal Grandmother   . Diabetes Sister   . Heart disease Father   . Heart disease Brother   . Heart disease Brother   . Colon cancer Neg Hx   . Stomach cancer Neg Hx      History   Social History  . Marital Status: Married    Spouse Name: N/A    Number of Children: 2  . Years of Education: N/A   Occupational History  . ADMINISTRATION    Social History Main Topics  . Smoking status: Never Smoker   . Smokeless tobacco: Never Used  . Alcohol Use: No  . Drug Use: No  . Sexually Active: Not on file   Other Topics Concern  . Not on file   Social History Narrative  . No narrative on file     BP 128/80  Ht 5\' 4"  (1.626 m)  Wt 173 lb (78.472 kg)  BMI 29.70 kg/m2  Physical Exam:  Well appearing middle-aged woman, NAD HEENT: Unremarkable Neck:  No JVD, no thyromegally Lungs:  Clear with no wheezes, rales, or rhonchi. HEART:  Regular rate rhythm, no murmurs, no rubs, no clicks Abd:  soft, positive bowel sounds, no organomegally, no rebound, no guarding Ext:  2 plus pulses, no edema, no cyanosis, no clubbing Skin:  No rashes no nodules Neuro:  CN II through XII intact, motor grossly intact  DEVICE  Normal device function.  See PaceArt for details.   Assess/Plan:

## 2011-12-01 NOTE — Assessment & Plan Note (Signed)
I recommended that the patient stop all antiarrhythmic drug therapy. She will currently undergo a strategy of rate control. We will undergo watchful waiting.

## 2011-12-01 NOTE — Assessment & Plan Note (Signed)
Her pacemaker is working normally. We'll follow

## 2011-12-01 NOTE — Assessment & Plan Note (Signed)
She is out of rhythm approximately 20% of the time. We will continue a strategy of rate control alone. We will uptitrate her beta blocker as tolerated.

## 2011-12-02 ENCOUNTER — Encounter: Payer: Self-pay | Admitting: Family Medicine

## 2011-12-04 ENCOUNTER — Telehealth: Payer: Self-pay | Admitting: *Deleted

## 2011-12-04 ENCOUNTER — Ambulatory Visit: Payer: BC Managed Care – PPO | Admitting: Pharmacist

## 2011-12-04 NOTE — Telephone Encounter (Signed)
Tried to get Iron studies done while pt was an In Pt at [facility] for initiation of a new cardiac med. The labs were never drawn. Will forget the labs until her cardiac situation is resolved.

## 2011-12-04 NOTE — Telephone Encounter (Signed)
Message copied by Florene Glen on Mon Dec 04, 2011  4:01 PM ------      Message from: Florene Glen      Created: Fri Nov 24, 2011  1:39 PM       Look for labs iron studies

## 2011-12-20 ENCOUNTER — Other Ambulatory Visit: Payer: Self-pay | Admitting: *Deleted

## 2011-12-20 DIAGNOSIS — I4891 Unspecified atrial fibrillation: Secondary | ICD-10-CM

## 2012-01-04 ENCOUNTER — Telehealth: Payer: Self-pay | Admitting: Internal Medicine

## 2012-01-04 NOTE — Telephone Encounter (Signed)
Called patient back and let her know that this is normal as she is not taking anything to keep her in rhythm.  She has taken an extra dose of her Carvedilol.  HR 93.  She wants to know what to do next.  She has been missing work at least a day a week.  Do we need to just set her up for Dr Johney Frame to discuss ablation?  She has just been started on Metformin 500mg  qhs for "pre-diabetes" by Dr Christell Constant in the last few weeks.

## 2012-01-04 NOTE — Telephone Encounter (Signed)
Pt calling re being in a-fib and not taking any meds other than a beta blocker, since yesterday evening has gotten worse

## 2012-01-09 NOTE — Assessment & Plan Note (Signed)
Pt's K low at 3.5 and QTc slightly prolonged.  Will have her take x 2 doses of potassium tonight and recheck labs in the AM.  If K >4.0, will be able to start Tikosyn.

## 2012-01-09 NOTE — Telephone Encounter (Signed)
To see Dr Johney Frame soon and start on anticoag now that she is diabetic

## 2012-01-12 ENCOUNTER — Ambulatory Visit (INDEPENDENT_AMBULATORY_CARE_PROVIDER_SITE_OTHER): Payer: BC Managed Care – PPO | Admitting: Internal Medicine

## 2012-01-12 ENCOUNTER — Encounter: Payer: Self-pay | Admitting: Internal Medicine

## 2012-01-12 VITALS — BP 133/69 | HR 62 | Ht 64.0 in | Wt 173.4 lb

## 2012-01-12 DIAGNOSIS — I4891 Unspecified atrial fibrillation: Secondary | ICD-10-CM

## 2012-01-12 DIAGNOSIS — I10 Essential (primary) hypertension: Secondary | ICD-10-CM

## 2012-01-12 MED ORDER — RIVAROXABAN 20 MG PO TABS: 20.0000 mg | ORAL_TABLET | Freq: Every day | ORAL | Status: DC

## 2012-01-12 NOTE — Patient Instructions (Addendum)
Your physician has recommended that you have an ablation. Catheter ablation is a medical procedure used to treat some cardiac arrhythmias (irregular heartbeats). During catheter ablation, a long, thin, flexible tube is put into a blood vessel in your groin (upper thigh), or neck. This tube is called an ablation catheter. It is then guided to your heart through the blood vessel. Radio frequency waves destroy small areas of heart tissue where abnormal heartbeats may cause an arrhythmia to start. Please see the instruction sheet given to you today.   Your physician has recommended you make the following change in your medication:  1) Start Xarelto 20mg daily  

## 2012-01-14 ENCOUNTER — Encounter: Payer: Self-pay | Admitting: Internal Medicine

## 2012-01-14 NOTE — Assessment & Plan Note (Addendum)
The patient has symptomatic paroxysmal atrial fibrillation.  Episodes now occur several times per week.  She has failed medical therapy with tikosyn, flecainide, and amiodarone.  Her symptoms suggest vagally mediated afib.  Therapeutic strategies for afib including medicine and ablation were discussed in detail with the patient today. Risk, benefits, and alternatives to EP study and radiofrequency ablation for afib were also discussed in detail today. These risks include but are not limited to stroke, bleeding, vascular damage, tamponade, perforation, damage to the esophagus, lungs, and other structures, pulmonary vein stenosis, worsening renal function, and death. The patient understands these risk and wishes to proceed.  We will therefore proceed with catheter ablation once the patient has been adequately anticoagulated. Xarelto is initiated today.  We will offer enrollment in Or[facility] II AF.

## 2012-01-14 NOTE — Progress Notes (Signed)
 Primary Care Physician: MOORE, DONALD, MD Primary EP:  Dr Taylor   [name] is a 66 y.o. female with a h/o paroxysmal atrial fibrillation who presents today for consideration of catheter ablation.  She reports having atrial fibrillation for more than 10 years.  She reports over the years noticing that eating a spicy meal or "over eating" were triggers for afib.  She has had increasing frequency and duration of atrial fibrillation and presents finds that episodes occur without precipitant.  She has failed medical therapy with tikosyn and flecainide.  She tried amiodarone but quickly stopped this medicine due to fear of side effects.  During atrial fibrillation, she reports feeling "washed out".  Her exercise tolerance is also depressed.  Today, she denies symptoms of chest pain, shortness of breath, orthopnea, PND, lower extremity edema, dizziness, presyncope, syncope, or neurologic sequela. The patient is tolerating medications without difficulties and is otherwise without complaint today.   Past Medical History  Diagnosis Date  . Impaired glucose tolerance   . Psoriasis   . IBS (irritable bowel syndrome)     vs diarrhea vs abd. fullness   . Chronic anxiety   . Uterine prolapse   . Bronchial spasms   . Atrial fibrillation   . Hypertension   . Esophageal stricture   . Elevated blood sugar   . Diverticulosis   . Rectocele, female   . History of bladder repair surgery   . Hx of vaginal hysterectomy   . Vaginal prolapse 1998  . Uterine prolapse   . Hypothyroidism   . Psoriasis   . Tachy-brady syndrome     a. 08/19/2008 s/p PPM: SJM 2110 Accent  . Complication of anesthesia     " I shake real bad "  . Family history of anesthesia complication     Daughter also shakes while waking up  . GERD (gastroesophageal reflux disease)   . H/O hiatal hernia   . Neuromuscular disorder     periferal neuropathy  . Anemia   . Paroxysmal atrial fibrillation   . H/O scarlet fever    Past  Surgical History  Procedure Date  . Vaginal hysterectomy     prolapse   . Bladder suspension   . Rectocele repair   . Transthoracic echocardiogram 2008  . Pacemaker insertion     SJM    Current Outpatient Prescriptions  Medication Sig Dispense Refill  . AMITIZA 8 MCG capsule as needed.      . ARTIFICIAL TEAR OP Apply 1 drop to eye 2 (two) times daily as needed. For dry eyes      . aspirin (ASPIRIN LOW DOSE) 81 MG EC tablet Take 81 mg by mouth 2 (two) times daily.        . Calcium Carbonate-Vit D-Min (CALCIUM 1200 PO) Take 1 tablet by mouth daily.       . carvedilol (COREG) 12.5 MG tablet Take 1 tablet in AM and 1&1/2 tablet in PM      . clobetasol cream (TEMOVATE) 0.05 %       . Coenzyme Q10 (CO Q 10) 100 MG CAPS Take 1 capsule by mouth daily.      . flecainide (TAMBOCOR) 100 MG tablet       . levothyroxine (SYNTHROID, LEVOTHROID) 100 MCG tablet Take 100 mcg by mouth daily.      . Linaclotide (LINZESS) 145 MCG CAPS Take 145 mcg by mouth daily as needed. For constipation      . LORazepam (ATIVAN) 0.5   MG tablet Take 1 mg by mouth at bedtime as needed. For sleep      . magnesium oxide (MAG-OX 400) 400 MG tablet Take 1 tablet (400 mg total) by mouth daily.  30 tablet  11  . metFORMIN (GLUCOPHAGE-XR) 500 MG 24 hr tablet       . methotrexate (RHEUMATREX) 2.5 MG tablet Take 10 mg by mouth once a week. Caution:Chemotherapy. Protect from light.      . Misc Natural Products (LEG VEIN & CIRCULATION) TABS Take 1 tablet by mouth daily.      . Multiple Vitamin (MULTIVITAMIN) tablet Take 1 tablet by mouth daily.        . Multiple Vitamins-Minerals (HAIR/SKIN/NAILS PO) Take 1 tablet by mouth every evening.      . multivitamin-lutein (OCUVITE-LUTEIN) CAPS Take 1 capsule by mouth 2 (two) times daily.      . NON FORMULARY ALL NATURAL MEMORY VITAMIN --- once daily      . omega-3 acid ethyl esters (LOVAZA) 1 G capsule Take 1 g by mouth 2 (two) times daily.      . omeprazole (PRILOSEC) 40 MG capsule Take  40 mg by mouth 2 (two) times daily.      . ONE TOUCH ULTRA TEST test strip       . PARoxetine (PAXIL) 10 MG tablet Take 10 mg by mouth every evening.      . rosuvastatin (CRESTOR) 10 MG tablet Take 10 mg by mouth daily.        . torsemide (DEMADEX) 20 MG tablet Take 20 mg by mouth daily.      . Rivaroxaban (XARELTO) 20 MG TABS Take 1 tablet (20 mg total) by mouth daily.  30 tablet  11    Allergies  Allergen Reactions  . Caudal Tray (Lidocaine-Epinephrine)     Edema    . Codeine     REACTION: nausea  . Diovan (Valsartan) Itching  . Doxycycline     Per pt: unknown  . Esomeprazole Magnesium     Headache    . Levofloxacin     Insomnia    . Neomycin-Bacitracin Zn-Polymyx     rash  . Penicillins     hives  . Sulfa Drugs Cross Reactors   . Verapamil     Edema   . Vimovo (Naproxen-Esomeprazole)     Upset stomach   . Zocor (Simvastatin)     Edema, and myalagias     History   Social History  . Marital Status: Married    Spouse Name: N/A    Number of Children: 2  . Years of Education: N/A   Occupational History  . ADMINISTRATION    Social History Main Topics  . Smoking status: Never Smoker   . Smokeless tobacco: Never Used  . Alcohol Use: No  . Drug Use: No  . Sexually Active: Not on file   Other Topics Concern  . Not on file   Social History Narrative   Lives in [place].  Works as a receptionist    Family History  Problem Relation Age of Onset  . Breast cancer Sister   . Ovarian cancer Sister   . Uterine cancer Paternal Aunt   . Crohn's disease Other     neice  . Diabetes Mother   . Heart disease Mother   . Diabetes Maternal Grandmother   . Diabetes Sister   . Heart disease Father   . Heart disease Brother   . Heart disease Brother   .   Colon cancer Neg Hx   . Stomach cancer Neg Hx     ROS- All systems are reviewed and negative except as per the HPI above  Physical Exam: Filed Vitals:   01/12/12 1610  BP: 133/69  Pulse: 62  Height: 5'  4" (1.626 m)  Weight: 173 lb 6.4 oz (78.654 kg)  SpO2: 90%    GEN- The patient is well appearing, alert and oriented x 3 today.   Head- normocephalic, atraumatic Eyes-  Sclera clear, conjunctiva pink Ears- hearing intact Oropharynx- clear Neck- supple, no JVP Lymph- no cervical lymphadenopathy Lungs- Clear to ausculation bilaterally, normal work of breathing Heart- Regular rate and rhythm, no murmurs, rubs or gallops, PMI not laterally displaced GI- soft, NT, ND, + BS Extremities- no clubbing, cyanosis, or edema MS- no significant deformity or atrophy Skin- no rash or lesion Psych- euthymic mood, full affect Neuro- strength and sensation are intact  EKG today reveals sinus bradycardia 55 bpm, PR 220, QRS 108, Qtc 495, nonspecific ST/ T changes  Echo 05/08/11- EF 55-60%, mild MR, LA 39mm  Assessment and Plan:  

## 2012-01-14 NOTE — Assessment & Plan Note (Signed)
Stable No change required today  

## 2012-01-17 ENCOUNTER — Other Ambulatory Visit: Payer: Self-pay | Admitting: Internal Medicine

## 2012-01-17 ENCOUNTER — Encounter: Payer: Self-pay | Admitting: *Deleted

## 2012-01-17 ENCOUNTER — Other Ambulatory Visit: Payer: Self-pay | Admitting: *Deleted

## 2012-01-17 DIAGNOSIS — I4891 Unspecified atrial fibrillation: Secondary | ICD-10-CM

## 2012-01-23 ENCOUNTER — Telehealth: Payer: Self-pay | Admitting: Internal Medicine

## 2012-01-23 NOTE — Telephone Encounter (Signed)
Pt is really concerned about her ablation being scheduled she has things she needs to set up and wanted to confirm the date for procedure

## 2012-01-23 NOTE — Progress Notes (Signed)
I can not close this 

## 2012-01-23 NOTE — Telephone Encounter (Signed)
I have left her a message in regards to the time.  A letter was mailed out last week.

## 2012-01-24 ENCOUNTER — Telehealth: Payer: Self-pay | Admitting: Internal Medicine

## 2012-01-24 NOTE — Telephone Encounter (Signed)
Pt has questions regarding message you left yesterday

## 2012-01-24 NOTE — Telephone Encounter (Signed)
Spoke with patient and le her know to continue with her Xarelto.  She can hold her Flecainide for 24 hours

## 2012-01-29 NOTE — Progress Notes (Signed)
I can not close this 

## 2012-01-30 ENCOUNTER — Encounter (HOSPITAL_COMMUNITY): Payer: Self-pay | Admitting: Pharmacy Technician

## 2012-01-30 ENCOUNTER — Other Ambulatory Visit (INDEPENDENT_AMBULATORY_CARE_PROVIDER_SITE_OTHER): Payer: BC Managed Care – PPO

## 2012-01-30 DIAGNOSIS — I4891 Unspecified atrial fibrillation: Secondary | ICD-10-CM

## 2012-01-31 LAB — CBC WITH DIFFERENTIAL/PLATELET
Basophils Absolute: 0 10*3/uL (ref 0.0–0.1)
Basophils Relative: 0.3 % (ref 0.0–3.0)
Eosinophils Absolute: 0.5 10*3/uL (ref 0.0–0.7)
Eosinophils Relative: 5.5 % — ABNORMAL HIGH (ref 0.0–5.0)
HCT: 40.8 % (ref 36.0–46.0)
Hemoglobin: 13.9 g/dL (ref 12.0–15.0)
Lymphocytes Relative: 40 % (ref 12.0–46.0)
Lymphs Abs: 3.7 10*3/uL (ref 0.7–4.0)
MCHC: 33.9 g/dL (ref 30.0–36.0)
MCV: 92 fl (ref 78.0–100.0)
Monocytes Absolute: 0.8 10*3/uL (ref 0.1–1.0)
Monocytes Relative: 9 % (ref 3.0–12.0)
Neutro Abs: 4.2 10*3/uL (ref 1.4–7.7)
Neutrophils Relative %: 45.2 % (ref 43.0–77.0)
Platelets: 335 10*3/uL (ref 150.0–400.0)
RBC: 4.44 Mil/uL (ref 3.87–5.11)
RDW: 13.5 % (ref 11.5–14.6)
WBC: 9.3 10*3/uL (ref 4.5–10.5)

## 2012-01-31 LAB — BASIC METABOLIC PANEL
BUN: 17 mg/dL (ref 6–23)
CO2: 35 mEq/L — ABNORMAL HIGH (ref 19–32)
Calcium: 9.4 mg/dL (ref 8.4–10.5)
Chloride: 98 mEq/L (ref 96–112)
Creatinine, Ser: 0.8 mg/dL (ref 0.4–1.2)
GFR: 81.32 mL/min (ref >60.00)
Glucose, Bld: 116 mg/dL — ABNORMAL HIGH (ref 70–99)
Potassium: 3.8 mEq/L (ref 3.5–5.1)
Sodium: 140 mEq/L (ref 135–145)

## 2012-02-03 ENCOUNTER — Other Ambulatory Visit: Payer: Self-pay | Admitting: Internal Medicine

## 2012-02-06 ENCOUNTER — Encounter (HOSPITAL_COMMUNITY): Payer: Self-pay | Admitting: *Deleted

## 2012-02-06 ENCOUNTER — Encounter (HOSPITAL_COMMUNITY): Payer: Self-pay | Admitting: Anesthesiology

## 2012-02-06 ENCOUNTER — Encounter (HOSPITAL_COMMUNITY)
Admission: RE | Disposition: A | Discharge status: Home / Self Care | Payer: Self-pay | Source: Ambulatory Visit | Attending: Cardiovascular Disease

## 2012-02-06 ENCOUNTER — Ambulatory Visit (HOSPITAL_COMMUNITY)
Admission: RE | Admit: 2012-02-06 | Discharge: 2012-02-07 | Disposition: A | Discharge status: Home / Self Care | Payer: BC Managed Care – PPO | Source: Ambulatory Visit | Attending: Cardiovascular Disease | Admitting: Cardiovascular Disease

## 2012-02-06 ENCOUNTER — Ambulatory Visit (HOSPITAL_COMMUNITY): Payer: BC Managed Care – PPO | Admitting: Anesthesiology

## 2012-02-06 DIAGNOSIS — L408 Other psoriasis: Secondary | ICD-10-CM | POA: Insufficient documentation

## 2012-02-06 DIAGNOSIS — Z950 Presence of cardiac pacemaker: Secondary | ICD-10-CM | POA: Insufficient documentation

## 2012-02-06 DIAGNOSIS — I4891 Unspecified atrial fibrillation: Secondary | ICD-10-CM

## 2012-02-06 DIAGNOSIS — I10 Essential (primary) hypertension: Secondary | ICD-10-CM | POA: Insufficient documentation

## 2012-02-06 DIAGNOSIS — I495 Sick sinus syndrome: Secondary | ICD-10-CM | POA: Diagnosis present

## 2012-02-06 DIAGNOSIS — Z79899 Other long term (current) drug therapy: Secondary | ICD-10-CM | POA: Insufficient documentation

## 2012-02-06 DIAGNOSIS — I480 Paroxysmal atrial fibrillation: Secondary | ICD-10-CM

## 2012-02-06 DIAGNOSIS — R7309 Other abnormal glucose: Secondary | ICD-10-CM | POA: Insufficient documentation

## 2012-02-06 DIAGNOSIS — E039 Hypothyroidism, unspecified: Secondary | ICD-10-CM | POA: Diagnosis present

## 2012-02-06 DIAGNOSIS — R7302 Impaired glucose tolerance (oral): Secondary | ICD-10-CM | POA: Diagnosis present

## 2012-02-06 HISTORY — PX: ATRIAL FIBRILLATION ABLATION: SHX5456

## 2012-02-06 HISTORY — PX: TEE WITHOUT CARDIOVERSION: SHX5443

## 2012-02-06 HISTORY — PX: ATRIAL FIBRILLATION ABLATION: SHX5732

## 2012-02-06 LAB — POCT ACTIVATED CLOTTING TIME
Activated Clotting Time: 361 seconds
Activated Clotting Time: 339 seconds
Activated Clotting Time: 350 seconds
Activated Clotting Time: 165 seconds

## 2012-02-06 LAB — GLUCOSE, CAPILLARY
Glucose-Capillary: 115 mg/dL — ABNORMAL HIGH (ref 70–99)
Glucose-Capillary: 117 mg/dL — ABNORMAL HIGH (ref 70–99)
Glucose-Capillary: 98 mg/dL (ref 70–99)
Glucose-Capillary: 95 mg/dL (ref 70–99)

## 2012-02-06 LAB — MRSA PCR SCREENING: MRSA by PCR: NEGATIVE

## 2012-02-06 SURGERY — ATRIAL FIBRILLATION ABLATION
Anesthesia: Monitor Anesthesia Care

## 2012-02-06 SURGERY — ECHOCARDIOGRAM, TRANSESOPHAGEAL
Anesthesia: Moderate Sedation

## 2012-02-06 MED ORDER — SODIUM CHLORIDE 0.9 % IV SOLN
INTRAVENOUS | Status: DC
  Administered 2012-02-06: 500 mL via INTRAVENOUS

## 2012-02-06 MED ORDER — PROTAMINE SULFATE 10 MG/ML IV SOLN
INTRAVENOUS | Status: DC | PRN
  Administered 2012-02-06: 10 mg via INTRAVENOUS

## 2012-02-06 MED ORDER — LEVOTHYROXINE SODIUM 100 MCG PO TABS: 100.0000 ug | ORAL_TABLET | Freq: Every day | ORAL | Status: DC

## 2012-02-06 MED ORDER — BUTAMBEN-TETRACAINE-BENZOCAINE 2-2-14 % EX AERO
INHALATION_SPRAY | CUTANEOUS | Status: DC | PRN
  Administered 2012-02-06: 2 via TOPICAL

## 2012-02-06 MED ORDER — LEVOTHYROXINE SODIUM 100 MCG PO TABS
100.0000 ug | ORAL_TABLET | Freq: Every day | ORAL | Status: DC
  Administered 2012-02-07: 100 ug via ORAL
  Filled 2012-02-06 (×2): qty 1

## 2012-02-06 MED ORDER — FENTANYL CITRATE 0.05 MG/ML IJ SOLN
INTRAMUSCULAR | Status: DC | PRN
  Administered 2012-02-06: 50 ug via INTRAVENOUS
  Administered 2012-02-06 (×2): 25 ug via INTRAVENOUS

## 2012-02-06 MED ORDER — RIVAROXABAN 20 MG PO TABS
20.0000 mg | ORAL_TABLET | Freq: Every day | ORAL | Status: DC
Start: 2012-02-06 — End: 2012-02-07
  Administered 2012-02-06: 20 mg via ORAL
  Filled 2012-02-06 (×2): qty 1

## 2012-02-06 MED ORDER — SODIUM CHLORIDE 0.9 % IV SOLN: 250.0000 mL | INTRAVENOUS | Status: DC | PRN

## 2012-02-06 MED ORDER — PAROXETINE HCL 10 MG PO TABS
10.0000 mg | ORAL_TABLET | Freq: Every evening | ORAL | Status: DC
  Administered 2012-02-06: 10 mg via ORAL
  Filled 2012-02-06 (×2): qty 1

## 2012-02-06 MED ORDER — BUPIVACAINE HCL (PF) 0.25 % IJ SOLN
INTRAMUSCULAR | Status: AC
  Filled 2012-02-06: qty 30

## 2012-02-06 MED ORDER — LACTATED RINGERS IV SOLN
INTRAVENOUS | Status: DC | PRN
  Administered 2012-02-06: 11:00:00 via INTRAVENOUS

## 2012-02-06 MED ORDER — PHENYLEPHRINE HCL 10 MG/ML IJ SOLN
INTRAMUSCULAR | Status: DC | PRN
  Administered 2012-02-06: 40 ug via INTRAVENOUS

## 2012-02-06 MED ORDER — ONDANSETRON HCL 4 MG/2ML IJ SOLN: 4.0000 mg | Freq: Four times a day (QID) | INTRAMUSCULAR | Status: DC | PRN

## 2012-02-06 MED ORDER — ONDANSETRON HCL 4 MG/2ML IJ SOLN
INTRAMUSCULAR | Status: DC | PRN
  Administered 2012-02-06: 4 mg via INTRAVENOUS

## 2012-02-06 MED ORDER — FENTANYL CITRATE 0.05 MG/ML IJ SOLN
INTRAMUSCULAR | Status: AC
  Filled 2012-02-06: qty 2

## 2012-02-06 MED ORDER — MIDAZOLAM HCL 5 MG/5ML IJ SOLN
INTRAMUSCULAR | Status: DC | PRN
  Administered 2012-02-06 (×2): 1 mg via INTRAVENOUS

## 2012-02-06 MED ORDER — FENTANYL CITRATE 0.05 MG/ML IJ SOLN
INTRAMUSCULAR | Status: DC | PRN
  Administered 2012-02-06: 50 ug via INTRAVENOUS
  Administered 2012-02-06 (×3): 25 ug via INTRAVENOUS

## 2012-02-06 MED ORDER — MIDAZOLAM HCL 10 MG/2ML IJ SOLN
INTRAMUSCULAR | Status: DC | PRN
  Administered 2012-02-06: 1 mg via INTRAVENOUS
  Administered 2012-02-06: 2 mg via INTRAVENOUS
  Administered 2012-02-06: 1 mg via INTRAVENOUS

## 2012-02-06 MED ORDER — SODIUM CHLORIDE 0.9 % IJ SOLN: 3.0000 mL | INTRAMUSCULAR | Status: DC | PRN

## 2012-02-06 MED ORDER — ACETAMINOPHEN 325 MG PO TABS
ORAL_TABLET | ORAL | Status: AC
  Filled 2012-02-06: qty 2

## 2012-02-06 MED ORDER — SODIUM CHLORIDE 0.9 % IJ SOLN
3.0000 mL | Freq: Two times a day (BID) | INTRAMUSCULAR | Status: DC
  Administered 2012-02-06 – 2012-02-07 (×2): 3 mL via INTRAVENOUS

## 2012-02-06 MED ORDER — ACETAMINOPHEN 325 MG PO TABS
650.0000 mg | ORAL_TABLET | ORAL | Status: DC | PRN
  Administered 2012-02-06 – 2012-02-07 (×3): 650 mg via ORAL
  Filled 2012-02-06 (×3): qty 2

## 2012-02-06 MED ORDER — FENTANYL CITRATE 0.05 MG/ML IJ SOLN: 25.0000 ug | INTRAMUSCULAR | Status: DC | PRN

## 2012-02-06 MED ORDER — PROPOFOL INFUSION 10 MG/ML OPTIME
INTRAVENOUS | Status: DC | PRN
  Administered 2012-02-06: 50 mg/kg/h via INTRAVENOUS

## 2012-02-06 MED ORDER — LORAZEPAM 1 MG PO TABS
1.0000 mg | ORAL_TABLET | Freq: Every evening | ORAL | Status: DC | PRN
  Administered 2012-02-06: 1 mg via ORAL
  Filled 2012-02-06: qty 1

## 2012-02-06 MED ORDER — MIDAZOLAM HCL 5 MG/ML IJ SOLN
INTRAMUSCULAR | Status: AC
  Filled 2012-02-06: qty 2

## 2012-02-06 MED ORDER — HEPARIN SODIUM (PORCINE) 1000 UNIT/ML IJ SOLN
INTRAMUSCULAR | Status: DC | PRN
  Administered 2012-02-06: 1000 [IU] via INTRAVENOUS

## 2012-02-06 NOTE — Interval H&P Note (Signed)
History and Physical Interval Note:  02/06/2012 11:25 AM  [name]  has presented today for surgery, with the diagnosis of afib  The various methods of treatment have been discussed with the patient and family. After consideration of risks, benefits and other options for treatment, the patient has consented to  Procedure(s) (LRB) with comments: ATRIAL FIBRILLATION ABLATION (N/A) as a surgical intervention .  The patient's history has been reviewed, patient examined, no change in status, stable for surgery.  I have reviewed the patient's chart and labs.  Questions were answered to the patient's satisfaction.     Hillis Range

## 2012-02-06 NOTE — Op Note (Signed)
SURGEON:  Hillis Range, MD  PREPROCEDURE DIAGNOSES: 1. Persistent atrial fibrillation.  POSTPROCEDURE DIAGNOSES: 1. Persistent atrial fibrillation.  PROCEDURES: 1. Comprehensive electrophysiologic study. 2. Coronary sinus pacing and recording. 3. Three-dimensional mapping of atrial fibrillation 4. Ablation of atrial fibrillation 5. Intracardiac echocardiography. 6. Transseptal puncture of an intact septum. 7. Rotational Angiography with processing at an independent workstation 8. Pacemaker interrogation and reprogramming (dual chamber)  INTRODUCTION:  [name] is a 66 y.o. female with a history of atrial fibrillation who now presents for EP study and radiofrequency ablation.  The patient reports initially being diagnosed with atrial fibrillation 10 years ago after presenting with symptomatic palpitations and fatgiue. The patient reports increasing frequency and duration of atrial fibrillation since that time. She has failed medical therapy with tikosyn, flecainide, and amiodarone.  Her paroxysmal atrial fibrillation appears have become persistent.  She has a pacemaker implanted previously for tachy/brady syndrome. The patient therefore presents today for catheter ablation of atrial fibrillation.  DESCRIPTION OF PROCEDURE:  Informed written consent was obtained, and the patient was brought to the electrophysiology lab in a fasting state.  The patient was adequately sedated with intravenous medications as outlined in the anesthesia report.  The patient's left and right groins were prepped and draped in the usual sterile fashion by the EP lab staff.  Using a percutaneous Seldinger technique, two 7-French and one 11-French hemostasis sheath was placed into the left common femoral vein.   3 Dimensional Rotational Angiography: A 5 french pigtail catheter was introduced through the right common femoral vein and advanced into the inferior venocava.  3 demential rotational angiography was then  performed by power injection of 100cc of nonionic contrast.  Reprocessing at an independent work station was then performed.   This demonstrated a moderate sized left atrium with 4 separate pulmonary veins which were also moderate in size.  There were no anomalous veins or significant abnormalities.  A 3 dimensional rendering of the left atrium was then merged using [employer] onto the Well[employer] system and registered with intracardiac echo (see below).  The pigtail catheter was then removed.  Catheter Placement:  A 7-French Biosense Webster Decapolar coronary sinus catheter was introduced through the right common femoral vein and advanced into the coronary sinus for recording and pacing from this location.  Unfortunately, due to a prominent valve within its ostium, this catheter could not be advanced into the CS despite multiple attempts.  A 5 French hemostasis sheath was therefore placed into the right internal jugular vein.  A 6French curved hexapolar damato catheter was advanced through the right internal jugular vein into the coronary sinus.   A 6-French quadripolar Josephson catheter was introduced through the right common femoral vein and advanced into the right ventricle for recording and pacing.  This catheter was then pulled back to the His bundle location.    Initial Measurements: The patient presented to the electrophysiology lab in atrial fibrillation.  Her HV interval measured 54 msec.     Intracardiac Echocardiography: A 10-French Biosense Webster AcuNav intracardiac echocardiography catheter was introduced through the left common femoral vein and advanced into the right atrium. Intracardiac echocardiography was performed of the left atrium, and a three-dimensional anatomical rendering of the left atrium was performed using CARTO sound technology.  The patient was noted to have a moderate sized left atrium.  The interatrial septum was prominent but not aneurysmal. All 4  pulmonary veins were visualized and noted to have separate ostia.  The pulmonary veins  were moderate in size.  The left atrial appendage was visualized and did not reveal thrombus.   There was no evidence of pulmonary vein stenosis.   Transseptal Puncture: The middle right common femoral vein sheath was exchanged for an 8.5 [place] SL2 transseptal sheath and transseptal access was achieved in a standard fashion using a Brockenbrough needle under biplane fluoroscopy with intracardiac echocardiography confirmation of the transseptal puncture.  Once transseptal access had been achieved, heparin was administered intravenously and intra- arterially in order to maintain an ACT of greater than 350 seconds throughout the procedure.   Pulmonary Venography: A 6-French multipurpose angiographic catheter with guidewire was introduced through the transseptal sheath and positioned over the mouth of all 4 pulmonary veins.  Pulmonary venograms were performed by hand injection of nonionic contrast and demonstrated moderate-sized pulmonary veins (approximately 15-20 mm in size).  There was no evidence of pulmonary vein stenosis within any of the pulmonary veins.  The angiographic catheter was then removed.    3D Mapping and Ablation: The His bundle catheter was removed and in its place a 3.5 mm Biosense Webster EZ [employer] ablation catheter was advanced into the right atrium.  The transseptal sheath was pulled back into the IVC over a guidewire.  The ablation catheter was advanced across the transseptal hole using the wire as a guide.  The transseptal sheath was then re-advanced over the guidewire into the left atrium.  A duodecapolar Biosense Webster circular mapping catheter was introduced through the transseptal sheath and positioned over the mouth of all 4 pulmonary veins.  Three-dimensional electroanatomical mapping was performed using CARTO technology.  This demonstrated electrical activity within all four  pulmonary veins at baseline. The patient underwent successful sequential electrical isolation and anatomical encircling of all four pulmonary veins using radiofrequency current with a circular mapping catheter as a guide. The patient spontaneously converted to sinus rhythm with isolation of the right inferior pulmonary vein.  She remained in sinus rhythm thereafter.  Measurements Following Ablation: In sinus rhythm with RR interval was 957, with PR 210 msec, QRS 99 msec, and Qt 483 msec.  Following ablation the AH interval measured 131 msec with an HV interval of 58 msec. Ventricular pacing was performed, which revealed VA dissociation when pacing at 600 msec.  Rapid atrial pacing was performed, which revealed an AV Wenckebach cycle length of 490 msec.  Electroisolation was then again confirmed in all four pulmonary veins.  No arrhythmias were observed with rapid atrial pacing down to a cycle length of .  The procedure was therefore considered completed.  All catheters were removed, and the sheaths were aspirated and flushed.  The patient was transferred to the recovery area for sheath removal per protocol.  A limited bedside transthoracic echocardiogram revealed no pericardial effusion.  There were no early apparent complications.  Her pacemaker was interrogation and confirmed to be functioning appropriately in the DDD pacing mode.  Atrial and ventricular sensing, pacing threshold, and impedance were confirmed to be stable when compared to prior values.  CONCLUSIONS: 1. Atrial fibrillation upon presentation.   2. Rotational Angiography reveals a moderate sized left atrium with four separate pulmonary veins without evidence of pulmonary vein stenosis. 3. Successful electrical isolation and anatomical encircling of all four pulmonary veins with radiofrequency current.  The patient converted to sinus rhythm upon isolation of the right inferior pulmonary vein 4. No inducible arrhythmias following  ablation 5. No early apparent complications.   Tyvion Edmondson,MD 2:02 PM 02/06/2012

## 2012-02-06 NOTE — Preoperative (Signed)
Beta Blockers   Reason not to administer Beta Blockers:Not Applicable 

## 2012-02-06 NOTE — Procedures (Signed)
Pt transported to cardiac cath lab.  Husband at bedside.  VSS.  No distress.

## 2012-02-06 NOTE — H&P (View-Only) (Signed)
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  She reports having atrial fibrillation for more than 10 years.  She reports over the years noticing that eating a spicy meal or "over eating" were triggers for afib.  She has had increasing frequency and duration of atrial fibrillation and presents finds that episodes occur without precipitant.  She has failed medical therapy with tikosyn and flecainide.  She tried amiodarone but quickly stopped this medicine due to fear of side effects.  During atrial fibrillation, she reports feeling "washed out".  Her exercise tolerance is also depressed.  Today, she denies symptoms of chest pain, shortness of breath, orthopnea, PND, lower extremity edema, dizziness, presyncope, syncope, or neurologic sequela. The patient is tolerating medications without difficulties and is otherwise without complaint today.   Past Medical History  Diagnosis Date  . Impaired glucose tolerance   . Psoriasis   . IBS (irritable bowel syndrome)     vs diarrhea vs abd. fullness   . Chronic anxiety   . Uterine prolapse   . Bronchial spasms   . Atrial fibrillation   . Hypertension   . Esophageal stricture   . Elevated blood sugar   . Diverticulosis   . Rectocele, female   . History of bladder repair surgery   . Hx of vaginal hysterectomy   . Vaginal prolapse 1998  . Uterine prolapse   . Hypothyroidism   . Psoriasis   . Tachy-brady syndrome     a. 08/19/2008 s/p PPM: SJM 2110 Accent  . Complication of anesthesia     " I shake real bad "  . Family history of anesthesia complication     Daughter also shakes while waking up  . GERD (gastroesophageal reflux disease)   . H/O hiatal hernia   . Neuromuscular disorder     periferal neuropathy  . Anemia   . Paroxysmal atrial fibrillation   . H/O scarlet fever    Past  Surgical History  Procedure Date  . Vaginal hysterectomy     prolapse   . Bladder suspension   . Rectocele repair   . Transthoracic echocardiogram 2008  . Pacemaker insertion     SJM    Current Outpatient Prescriptions  Medication Sig Dispense Refill  . AMITIZA 8 MCG capsule as needed.      . ARTIFICIAL TEAR OP Apply 1 drop to eye 2 (two) times daily as needed. For dry eyes      . aspirin (ASPIRIN LOW DOSE) 81 MG EC tablet Take 81 mg by mouth 2 (two) times daily.        . Calcium Carbonate-Vit D-Min (CALCIUM 1200 PO) Take 1 tablet by mouth daily.       . carvedilol (COREG) 12.5 MG tablet Take 1 tablet in AM and 1&1/2 tablet in PM      . clobetasol cream (TEMOVATE) 0.05 %       . Coenzyme Q10 (CO Q 10) 100 MG CAPS Take 1 capsule by mouth daily.      . flecainide (TAMBOCOR) 100 MG tablet       . levothyroxine (SYNTHROID, LEVOTHROID) 100 MCG tablet Take 100 mcg by mouth daily.      . Linaclotide (LINZESS) 145 MCG CAPS Take 145 mcg by mouth daily as needed. For constipation      . LORazepam (ATIVAN) 0.5  MG tablet Take 1 mg by mouth at bedtime as needed. For sleep      . magnesium oxide (MAG-OX 400) 400 MG tablet Take 1 tablet (400 mg total) by mouth daily.  30 tablet  11  . metFORMIN (GLUCOPHAGE-XR) 500 MG 24 hr tablet       . methotrexate (RHEUMATREX) 2.5 MG tablet Take 10 mg by mouth once a week. Caution:Chemotherapy. Protect from light.      . Misc Natural Products (LEG VEIN & CIRCULATION) TABS Take 1 tablet by mouth daily.      . Multiple Vitamin (MULTIVITAMIN) tablet Take 1 tablet by mouth daily.        . Multiple Vitamins-Minerals (HAIR/SKIN/NAILS PO) Take 1 tablet by mouth every evening.      . multivitamin-lutein (OCUVITE-LUTEIN) CAPS Take 1 capsule by mouth 2 (two) times daily.      . NON FORMULARY ALL NATURAL MEMORY VITAMIN --- once daily      . omega-3 acid ethyl esters (LOVAZA) 1 G capsule Take 1 g by mouth 2 (two) times daily.      Marland Kitchen omeprazole (PRILOSEC) 40 MG capsule Take  40 mg by mouth 2 (two) times daily.      . ONE TOUCH ULTRA TEST test strip       . PARoxetine (PAXIL) 10 MG tablet Take 10 mg by mouth every evening.      . rosuvastatin (CRESTOR) 10 MG tablet Take 10 mg by mouth daily.        Marland Kitchen torsemide (DEMADEX) 20 MG tablet Take 20 mg by mouth daily.      . Rivaroxaban (XARELTO) 20 MG TABS Take 1 tablet (20 mg total) by mouth daily.  30 tablet  11    Allergies  Allergen Reactions  . Caudal Tray (Lidocaine-Epinephrine)     Edema    . Codeine     REACTION: nausea  . Diovan (Valsartan) Itching  . Doxycycline     Per pt: unknown  . Esomeprazole Magnesium     Headache    . Levofloxacin     Insomnia    . Neomycin-Bacitracin Zn-Polymyx     rash  . Penicillins     hives  . Sulfa Drugs Cross Reactors   . Verapamil     Edema   . Vimovo (Naproxen-Esomeprazole)     Upset stomach   . Zocor (Simvastatin)     Edema, and myalagias     History   Social History  . Marital Status: Married    Spouse Name: N/A    Number of Children: 2  . Years of Education: N/A   Occupational History  . ADMINISTRATION    Social History Main Topics  . Smoking status: Never Smoker   . Smokeless tobacco: Never Used  . Alcohol Use: No  . Drug Use: No  . Sexually Active: Not on file   Other Topics Concern  . Not on file   Social History Narrative   Lives in [place].  Works as a Scientist, physiological    Family History  Problem Relation Age of Onset  . Breast cancer Sister   . Ovarian cancer Sister   . Uterine cancer Paternal Aunt   . Crohn's disease Other     neice  . Diabetes Mother   . Heart disease Mother   . Diabetes Maternal Grandmother   . Diabetes Sister   . Heart disease Father   . Heart disease Brother   . Heart disease Brother   .  Colon cancer Neg Hx   . Stomach cancer Neg Hx     ROS- All systems are reviewed and negative except as per the HPI above  Physical Exam: Filed Vitals:   01/12/12 1610  BP: 133/69  Pulse: 62  Height: 5'  4" (1.626 m)  Weight: 173 lb 6.4 oz (78.654 kg)  SpO2: 90%    GEN- The patient is well appearing, alert and oriented x 3 today.   Head- normocephalic, atraumatic Eyes-  Sclera clear, conjunctiva pink Ears- hearing intact Oropharynx- clear Neck- supple, no JVP Lymph- no cervical lymphadenopathy Lungs- Clear to ausculation bilaterally, normal work of breathing Heart- Regular rate and rhythm, no murmurs, rubs or gallops, PMI not laterally displaced GI- soft, NT, ND, + BS Extremities- no clubbing, cyanosis, or edema MS- no significant deformity or atrophy Skin- no rash or lesion Psych- euthymic mood, full affect Neuro- strength and sensation are intact  EKG today reveals sinus bradycardia 55 bpm, PR 220, QRS 108, Qtc 495, nonspecific ST/ T changes  Echo 05/08/11- EF 55-60%, mild MR, LA 39mm  Assessment and Plan:

## 2012-02-06 NOTE — Anesthesia Procedure Notes (Signed)
Procedure Name: MAC Date/Time: 02/06/2012 11:27 AM Performed by: Gayla Medicus Pre-anesthesia Checklist: Patient identified, Patient being monitored, Timeout performed, Emergency Drugs available and Suction available Patient Re-evaluated:Patient Re-evaluated prior to inductionOxygen Delivery Method: Simple face mask Dental Injury: Teeth and Oropharynx as per pre-operative assessment

## 2012-02-06 NOTE — Interval H&P Note (Signed)
History and Physical Interval Note:  02/06/2012 9:18 AM  [name]  has presented today for surgery, with the diagnosis of a-fib  The various methods of treatment have been discussed with the patient and family. After consideration of risks, benefits and other options for treatment, the patient has consented to  Procedure(s) (LRB) with comments: TRANSESOPHAGEAL ECHOCARDIOGRAM (TEE) (N/A) as a surgical intervention .  The patient's history has been reviewed, patient examined, no change in status, stable for surgery.  I have reviewed the patient's chart and labs.  Questions were answered to the patient's satisfaction.     Elyn Aquas.

## 2012-02-06 NOTE — Anesthesia Preprocedure Evaluation (Signed)
Anesthesia Evaluation  Patient identified by MRN, date of birth, ID band Patient awake    Reviewed: Allergy & Precautions, H&P , NPO status , Patient's Chart, lab work & pertinent test results, reviewed documented beta blocker date and time   History of Anesthesia Complications (+) Family history of anesthesia reaction  Airway Mallampati: III TM Distance: >3 FB Neck ROM: Full    Dental No notable dental hx. (+) Chipped and Dental Advisory Given   Pulmonary neg pulmonary ROS,  breath sounds clear to auscultation  Pulmonary exam normal       Cardiovascular hypertension, + dysrhythmias Atrial Fibrillation + pacemaker Rhythm:Irregular Rate:Normal     Neuro/Psych  Neuromuscular disease negative neurological ROS  negative psych ROS   GI/Hepatic Neg liver ROS, hiatal hernia, GERD-  Medicated and Controlled,  Endo/Other  diabetes, Type 2, Oral Hypoglycemic AgentsHypothyroidism   Renal/GU negative Renal ROS  negative genitourinary   Musculoskeletal   Abdominal   Peds  Hematology negative hematology ROS (+) anemia ,   Anesthesia Other Findings   Reproductive/Obstetrics negative OB ROS                           Anesthesia Physical Anesthesia Plan  ASA: III  Anesthesia Plan: MAC   Post-op Pain Management:    Induction: Intravenous  Airway Management Planned: Simple Face Mask  Additional Equipment:   Intra-op Plan:   Post-operative Plan:   Informed Consent: I have reviewed the patients History and Physical, chart, labs and discussed the procedure including the risks, benefits and alternatives for the proposed anesthesia with the patient or authorized representative who has indicated his/her understanding and acceptance.   Dental advisory given  Plan Discussed with: CRNA  Anesthesia Plan Comments:         Anesthesia Quick Evaluation

## 2012-02-06 NOTE — Progress Notes (Signed)
  Echocardiogram Echocardiogram Transesophageal has been performed.  Margreta Journey 02/06/2012, 10:54 AM

## 2012-02-06 NOTE — Transfer of Care (Signed)
Immediate Anesthesia Transfer of Care Note  Patient: [name]  Procedure(s) Performed: Procedure(s) (LRB) with comments: ATRIAL FIBRILLATION ABLATION (N/A)  Patient Location: PACU  Anesthesia Type:MAC  Level of Consciousness: awake, alert  and oriented  Airway & Oxygen Therapy: Patient Spontanous Breathing and Patient connected to nasal cannula oxygen  Post-op Assessment: Report given to PACU RN, Post -op Vital signs reviewed and stable and Patient moving all extremities X 4  Post vital signs: Reviewed and stable  Complications: No apparent anesthesia complications

## 2012-02-06 NOTE — Anesthesia Postprocedure Evaluation (Signed)
  Anesthesia Post-op Note  Patient: [name]  Procedure(s) Performed: Procedure(s) (LRB) with comments: ATRIAL FIBRILLATION ABLATION (N/A)  Patient Location: PACU  Anesthesia Type:MAC  Level of Consciousness: awake and alert   Airway and Oxygen Therapy: Patient Spontanous Breathing  Post-op Pain: none  Post-op Assessment: Post-op Vital signs reviewed, Patient's Cardiovascular Status Stable, Respiratory Function Stable, Patent Airway and No signs of Nausea or vomiting  Post-op Vital Signs: Reviewed and stable  Complications: No apparent anesthesia complications

## 2012-02-06 NOTE — CV Procedure (Signed)
    Transesophageal Echocardiogram Note  [name] [id number] 09-Apr-1945  Procedure: Transesophageal Echocardiogram Indications: Atrial fibrillation, evaluate for thrombus  Procedure Details Consent: Obtained Time Out: Verified patient identification, verified procedure, site/side was marked, verified correct patient position, special equipment/implants available, Radiology Safety Procedures followed,  medications/allergies/relevent history reviewed, required imaging and test results available.  Performed  Medications: Fentanyl: 100 mcg IV Versed: 4 mg IV  Left Ventrical:  Normal LV function  Mitral Valve: trace MR  Aortic Valve: normal  Tricuspid Valve: mild TR  Pulmonic Valve: normal  Left Atrium/ Left atrial appendage: no thrombus  Atrial septum: normal ( bubble study not performed.  Pacer Wire:  The atrial pacing wire appears to have  a filamentous strand attached close to the insertion into the left atrium.  This structure is clearly separate from the Eustachian valve.  It is a thin, filamentous strand without any globular appearance the we might expect from a vegetation.   Aorta: normal.   Complications: No apparent complications Patient did tolerate procedure well.   Vesta Mixer, Montez Hageman., MD, [facility] 02/06/2012, 9:59 AM

## 2012-02-07 ENCOUNTER — Encounter (HOSPITAL_COMMUNITY): Payer: Self-pay | Admitting: *Deleted

## 2012-02-07 DIAGNOSIS — I480 Paroxysmal atrial fibrillation: Secondary | ICD-10-CM

## 2012-02-07 DIAGNOSIS — R7302 Impaired glucose tolerance (oral): Secondary | ICD-10-CM | POA: Diagnosis present

## 2012-02-07 DIAGNOSIS — I495 Sick sinus syndrome: Secondary | ICD-10-CM | POA: Diagnosis present

## 2012-02-07 DIAGNOSIS — I4891 Unspecified atrial fibrillation: Secondary | ICD-10-CM

## 2012-02-07 LAB — BASIC METABOLIC PANEL
BUN: 12 mg/dL (ref 6–23)
CO2: 30 mEq/L (ref 19–32)
Calcium: 8.9 mg/dL (ref 8.4–10.5)
Chloride: 106 mEq/L (ref 96–112)
Creatinine, Ser: 0.68 mg/dL (ref 0.50–1.10)
GFR calc Af Amer: >90 mL/min (ref >90)
GFR calc non Af Amer: >90 mL/min (ref >90)
Glucose, Bld: 123 mg/dL — ABNORMAL HIGH (ref 70–99)
Potassium: 4.2 mEq/L (ref 3.5–5.1)
Sodium: 142 mEq/L (ref 135–145)

## 2012-02-07 NOTE — Progress Notes (Signed)
Utilization Review Completed.[name] T12/18/2013   

## 2012-02-07 NOTE — Progress Notes (Signed)
Pt alert and oriented x4. Pt and pt spouse received discharge instructions.  Pt denies any questions or concerns.  IV access x2 removed.  Sites clean dry and intact.  Cannulas intact.  Pt tolerated removal well.  Pt given instruction regarding groin and IJ site drsg. Pt to be discharged home with spouse per wheelchair.

## 2012-02-07 NOTE — Discharge Summary (Addendum)
Patient ID: [name],  MRN: [id number], DOB/AGE: 64-Jan-1947 64 y.o.  Admit date: 02/06/2012 Discharge date: 02/07/2012  Primary Care Provider: Rudi Heap Primary Cardiologist: G. Ladona Ridgel, MD  Discharge Diagnoses Principal Problem:  *PAF (paroxysmal atrial fibrillation)  **s/p EP study and successful ablation this admission. Active Problems:  Tachy-Brady syndrome s/p St.Jude PPM  HYPOTHYROIDISM  HYPERTENSION  Impaired Glucose Tolerance  Psoriasis  Allergies Allergies  Allergen Reactions  . Caudal Tray (Lidocaine-Epinephrine)     Edema    . Codeine     REACTION: nausea  . Diovan (Valsartan) Itching  . Doxycycline     Per pt: unknown  . Esomeprazole Magnesium     Headache    . Levofloxacin     Insomnia    . Neomycin-Bacitracin Zn-Polymyx     rash  . Penicillins     hives  . Sulfa Drugs Cross Reactors   . Verapamil     Edema   . Vimovo (Naproxen-Esomeprazole)     Upset stomach   . Zocor (Simvastatin)     Edema, and myalagias    Procedures  02/06/2012 Transesophageal Echocardiogram  - Left ventricle: Systolic function was normal. The   estimated ejection fraction was in the range of 55% to   60%. - Aortic valve: No evidence of vegetation. - Mitral valve: No evidence of vegetation. - Left atrium: No evidence of thrombus in the atrial cavity   or appendage. - Pulmonic valve: No evidence of vegetation _____________  02/06/2012 EP study/ Atrial Fibrillation ablation 1. Comprehensive electrophysiologic study. 2. Coronary sinus pacing and recording. 3. Three-dimensional mapping of atrial fibrillation 4. Ablation of atrial fibrillation 5. Intracardiac echocardiography. 6. Transseptal puncture of an intact septum. 7. Rotational Angiography with processing at an independent workstation 8. Pacemaker interrogation and reprogramming (dual chamber)  CONCLUSIONS: 1. Atrial fibrillation upon presentation.    2. Rotational Angiography reveals a moderate  sized left atrium with four separate pulmonary veins without evidence of pulmonary vein stenosis. 3. Successful electrical isolation and anatomical encircling of all four pulmonary veins with radiofrequency current.  The patient converted to sinus rhythm upon isolation of the right inferior pulmonary vein 4. No inducible arrhythmias following ablation 5. No early apparent complications. _____________  History of Present Illness  66 y/o female with h/o PAF despite attempts at rhythm mgmt in the past using tikosyn, amiodarone, and most recently flecainide.  She was recently referred to Dr. Johney Frame for consideration of atrial fibrillation ablation in late November.  At that time, she was placed on Xarelto and arrangements were made for TEE and EP study.  Hospital Course  Pt presented to [facility] on 12/17 and underwent TEE, revealing normal LV function w/o evidence of LA/LAA thrombus.  She then underwent EP study followed by successful radiofrequency catheter ablation.  She tolerated procedure well and post-procedure has maintained sinus rhythm.  She will be discharged home this morning in good condition.  Discharge Vitals Blood pressure 120/53, pulse 65, temperature 98.5 F (36.9 C), temperature source Oral, resp. rate 17, height 5\' 4"  (1.626 m), weight 175 lb 0.7 oz (79.4 kg), SpO2 94.00%.  Filed Weights   02/06/12 0853 02/06/12 1750  Weight: 170 lb (77.111 kg) 175 lb 0.7 oz (79.4 kg)    Labs  Basic Metabolic Panel  Basename 02/07/12 0505  NA 142  K 4.2  CL 106  CO2 30  GLUCOSE 123*  BUN 12  CREATININE 0.68  CALCIUM 8.9  MG --  PHOS --   Disposition  Pt is being discharged home today in good condition.  Follow-up Plans & Appointments  Follow-up Information    Follow up with Lewayne Bunting, MD. On 03/05/2012. (9:45 AM)    Contact information:   [address] [place] [address] [phone]        Discharge Medications    Medication List     As  of 02/07/2012  9:54 AM    TAKE these medications         AMITIZA 8 MCG capsule   Generic drug: lubiprostone   Take 8 mcg by mouth as needed. As needed for constipation.      ARTIFICIAL TEAR OP   Place 1 drop into both eyes 2 (two) times daily as needed. For dry eyes      CALCIUM 1200 PO   Take 1 tablet by mouth daily.      carvedilol 12.5 MG tablet   Commonly known as: COREG   TAKE 1 TABLET BY MOUTH IN THE MORNING , AND TAKE 1 & 1/2 TABLETS IN THE EVENING      carvedilol 12.5 MG tablet   Commonly known as: COREG   Take 1 tablet in AM and 1&1/2 tablet in PM      clobetasol cream 0.05 %   Commonly known as: TEMOVATE   Apply 1 application topically daily as needed. As needed for Psoriasis.      Co Q 10 100 MG Caps   Take 1 capsule by mouth daily.      flecainide 100 MG tablet   Commonly known as: TAMBOCOR   Take 100 mg by mouth 2 (two) times daily.      LEG VEIN & CIRCULATION Tabs   Take 1 tablet by mouth daily.      levothyroxine 100 MCG tablet   Commonly known as: SYNTHROID, LEVOTHROID   Take 100 mcg by mouth daily.      LORazepam 0.5 MG tablet   Commonly known as: ATIVAN   Take 1 mg by mouth at bedtime as needed. For sleep      magnesium oxide 400 MG tablet   Commonly known as: MAG-OX   Take 1 tablet (400 mg total) by mouth daily.      metFORMIN 500 MG 24 hr tablet   Commonly known as: GLUCOPHAGE-XR   Take 500 mg by mouth daily with breakfast.      methotrexate 2.5 MG tablet   Commonly known as: RHEUMATREX   Take 10 mg by mouth once a week. Caution:Chemotherapy. Protect from light.      multivitamin tablet   Take 1 tablet by mouth daily.      multivitamin-lutein Caps   Take 1 capsule by mouth 2 (two) times daily.      HAIR/SKIN/NAILS PO   Take 1 tablet by mouth every evening.      NON FORMULARY   ALL NATURAL MEMORY VITAMIN --- once daily      omega-3 acid ethyl esters 1 G capsule   Commonly known as: LOVAZA   Take 1 g by mouth 2 (two) times daily.       omeprazole 40 MG capsule   Commonly known as: PRILOSEC   Take 40 mg by mouth 2 (two) times daily.      ONE TOUCH ULTRA TEST test strip   Generic drug: glucose blood      PARoxetine 10 MG tablet   Commonly known as: PAXIL   Take 10 mg by mouth every evening.  PRESCRIPTION MEDICATION   Take 1 tablet by mouth daily. Integra 125-40-3.      Rivaroxaban 20 MG Tabs   Commonly known as: XARELTO   Take 1 tablet (20 mg total) by mouth daily.      rosuvastatin 10 MG tablet   Commonly known as: CRESTOR   Take 10 mg by mouth daily.      torsemide 20 MG tablet   Commonly known as: DEMADEX   Take 20 mg by mouth daily.      Outstanding Labs/Studies  None  Duration of Discharge Encounter   Greater than 30 minutes including physician time.  Signed, Nicolasa Ducking NP 02/07/2012, 9:54 AM   EP Attending  Patient seen and examined. Agree with Above. I have discussed her followup with Dr. Johney Frame. Will continue her current meds. Follow up as above.  Leonia Reeves.D.

## 2012-02-08 ENCOUNTER — Telehealth: Payer: Self-pay | Admitting: Internal Medicine

## 2012-02-08 ENCOUNTER — Other Ambulatory Visit: Payer: Self-pay | Admitting: Gastroenterology

## 2012-02-08 NOTE — Telephone Encounter (Signed)
She is off her Flecainide for now and will continue to stay off unless she feels she can not tolerate and stays out of rhythm for an extended period of time

## 2012-02-08 NOTE — Telephone Encounter (Signed)
plz return call to pt [phone] regarding questions about recent procedure and medication.

## 2012-02-16 ENCOUNTER — Encounter: Payer: Self-pay | Admitting: *Deleted

## 2012-02-16 ENCOUNTER — Telehealth: Payer: Self-pay | Admitting: Internal Medicine

## 2012-02-16 NOTE — Telephone Encounter (Signed)
New problem:   Discuss Flecainide , C/O atrial fib.

## 2012-02-16 NOTE — Telephone Encounter (Signed)
Spoke with patient and let her know Dr Johney Frame felt like she should take the Flecainide as directed but if she feels she is better taking it prn then she can do so but do not take more than twice daily.

## 2012-03-05 ENCOUNTER — Encounter: Payer: Self-pay | Admitting: Internal Medicine

## 2012-03-05 ENCOUNTER — Ambulatory Visit (INDEPENDENT_AMBULATORY_CARE_PROVIDER_SITE_OTHER): Payer: BC Managed Care – PPO | Admitting: Internal Medicine

## 2012-03-05 VITALS — BP 165/77 | HR 55 | Ht 64.0 in | Wt 172.4 lb

## 2012-03-05 DIAGNOSIS — I480 Paroxysmal atrial fibrillation: Secondary | ICD-10-CM

## 2012-03-05 DIAGNOSIS — I4891 Unspecified atrial fibrillation: Secondary | ICD-10-CM

## 2012-03-05 DIAGNOSIS — Z950 Presence of cardiac pacemaker: Secondary | ICD-10-CM

## 2012-03-05 LAB — PACEMAKER DEVICE OBSERVATION
AL AMPLITUDE: 3.3 mv
AL IMPEDENCE PM: 400 Ohm
AL THRESHOLD: 0.75 V
ATRIAL PACING PM: 1.5
BAMS-0001: 150 {beats}/min
BAMS-0003: 70 {beats}/min
BATTERY VOLTAGE: 2.98 V
DEVICE MODEL PM: 2285953
RV LEAD AMPLITUDE: 5.8 mv
RV LEAD IMPEDENCE PM: 410 Ohm
RV LEAD THRESHOLD: 1.375 V
VENTRICULAR PACING PM: 4.8

## 2012-03-05 NOTE — Progress Notes (Signed)
HPI   [name]  returns today for additional evaluation. She is a very pleasant 67 year old woman with a history of paroxysmal atrial fibrillation, symptomatic tachycardia bradycardia syndrome, status post permanent pacemaker insertion. She was treated with multiple antiarrhythmic drugs and has over the years had worsening atrial fibrillation. Several weeks ago, she underwent atrial fibrillation ablation. In the interim, she has done well. She is taking only when necessary flecainide. Her palpitations are still present but markedly diminished. She denies chest pain or shortness of breath. She does experience acid reflux symptoms and these have worsened since her ablation. She is taking omeprazole twice a day.  Allergies  Allergen Reactions  . Caudal Tray (Lidocaine-Epinephrine)     Edema    . Codeine     REACTION: nausea  . Diovan (Valsartan) Itching  . Doxycycline     Per pt: unknown  . Esomeprazole Magnesium     Headache    . Levofloxacin     Insomnia    . Neomycin-Bacitracin Zn-Polymyx     rash  . Penicillins     hives  . Sulfa Drugs Cross Reactors   . Verapamil     Edema   . Vimovo (Naproxen-Esomeprazole)     Upset stomach   . Zocor (Simvastatin)     Edema, and myalagias      Current Outpatient Prescriptions  Medication Sig Dispense Refill  . AMITIZA 8 MCG capsule Take 8 mcg by mouth as needed. As needed for constipation.      . ARTIFICIAL TEAR OP Place 1 drop into both eyes 2 (two) times daily as needed. For dry eyes      . Calcium Carbonate-Vit D-Min (CALCIUM 1200 PO) Take 1 tablet by mouth daily.       . carvedilol (COREG) 12.5 MG tablet TAKE 1 TABLET BY MOUTH IN THE MORNING , AND TAKE 1 & 1/2 TABLETS IN THE EVENING  75 tablet  5  . clobetasol cream (TEMOVATE) 0.05 % Apply 1 application topically daily as needed. As needed for Psoriasis.      . Coenzyme Q10 (CO Q 10) 100 MG CAPS Take 1 capsule by mouth daily.      . flecainide (TAMBOCOR) 100 MG tablet Take 100 mg  by mouth 2 (two) times daily.       Marland Kitchen levothyroxine (SYNTHROID, LEVOTHROID) 100 MCG tablet Take 100 mcg by mouth daily.      Marland Kitchen LORazepam (ATIVAN) 0.5 MG tablet Take 1 mg by mouth at bedtime as needed. For sleep      . magnesium oxide (MAG-OX 400) 400 MG tablet Take 1 tablet (400 mg total) by mouth daily.  30 tablet  11  . metFORMIN (GLUCOPHAGE-XR) 500 MG 24 hr tablet Take 500 mg by mouth daily with breakfast.       . methotrexate (RHEUMATREX) 2.5 MG tablet Take 10 mg by mouth once a week. Caution:Chemotherapy. Protect from light.      . Misc Natural Products (LEG VEIN & CIRCULATION) TABS Take 1 tablet by mouth daily.      . Multiple Vitamin (MULTIVITAMIN) tablet Take 1 tablet by mouth daily.        . Multiple Vitamins-Minerals (HAIR/SKIN/NAILS PO) Take 1 tablet by mouth every evening.      . multivitamin-lutein (OCUVITE-LUTEIN) CAPS Take 1 capsule by mouth 2 (two) times daily.      . NON FORMULARY ALL NATURAL MEMORY VITAMIN --- once daily      . omega-3 acid ethyl esters (LOVAZA) 1  G capsule Take 1 g by mouth 2 (two) times daily.      Marland Kitchen omeprazole (PRILOSEC) 40 MG capsule TAKE ONE CAPSULE BY MOUTH TWICE A DAY  60 capsule  3  . ONE TOUCH ULTRA TEST test strip       . PARoxetine (PAXIL) 10 MG tablet Take 10 mg by mouth every evening.      Marland Kitchen PRESCRIPTION MEDICATION Take 1 tablet by mouth daily. Integra 125-40-3.      Marland Kitchen Rivaroxaban (XARELTO) 20 MG TABS Take 1 tablet (20 mg total) by mouth daily.  30 tablet  11  . rosuvastatin (CRESTOR) 10 MG tablet Take 10 mg by mouth daily.        Marland Kitchen torsemide (DEMADEX) 20 MG tablet Take 20 mg by mouth daily.         Past Medical History  Diagnosis Date  . Impaired glucose tolerance   . Psoriasis   . IBS (irritable bowel syndrome)     vs diarrhea vs abd. fullness   . Chronic anxiety   . Uterine prolapse   . Bronchial spasms   . Hypertension   . Esophageal stricture   . Elevated blood sugar   . Diverticulosis   . Rectocele, female   . History of  bladder repair surgery   . Hx of vaginal hysterectomy   . Vaginal prolapse 1998  . Hypothyroidism   . Psoriasis   . Tachy-brady syndrome     a. 08/19/2008 s/p PPM: SJM 2110 Accent  . Complication of anesthesia     " I shake real bad "  . Family history of anesthesia complication     Daughter also shakes while waking up  . GERD (gastroesophageal reflux disease)   . H/O hiatal hernia   . Neuromuscular disorder     periferal neuropathy  . Anemia   . Paroxysmal atrial fibrillation     a. failed flecainide, tikosyn, amio;  b. 01/2012 s/p RFCA.  . H/O scarlet fever   . Pacemaker     St. Jude    ROS:   All systems reviewed and negative except as noted in the HPI.   Past Surgical History  Procedure Date  . Vaginal hysterectomy     prolapse   . Bladder suspension   . Rectocele repair   . Transthoracic echocardiogram 2008  . Pacemaker insertion     SJM  . Tee without cardioversion 02/06/2012    Procedure: TRANSESOPHAGEAL ECHOCARDIOGRAM (TEE);  Surgeon: Vesta Mixer, MD;  Location: [facility] ENDOSCOPY;  Service: Cardiovascular;  Laterality: N/A;     Family History  Problem Relation Age of Onset  . Breast cancer Sister   . Ovarian cancer Sister   . Uterine cancer Paternal Aunt   . Crohn's disease Other     neice  . Diabetes Mother   . Heart disease Mother   . Diabetes Maternal Grandmother   . Diabetes Sister   . Heart disease Father   . Heart disease Brother   . Heart disease Brother   . Colon cancer Neg Hx   . Stomach cancer Neg Hx      History   Social History  . Marital Status: Married    Spouse Name: N/A    Number of Children: 2  . Years of Education: N/A   Occupational History  . ADMINISTRATION    Social History Main Topics  . Smoking status: Never Smoker   . Smokeless tobacco: Never Used  . Alcohol Use: No  .  Drug Use: No  . Sexually Active: Yes   Other Topics Concern  . Not on file   Social History Narrative   Lives in [place].  Works as a  Scientist, physiological     BP 165/77  Pulse 55  Ht 5\' 4"  (1.626 m)  Wt 172 lb 6.4 oz (78.2 kg)  BMI 29.59 kg/m2  Physical Exam:  Well appearing  middle-aged woman, NAD HEENT: Unremarkable Neck:  No JVD, no thyromegally Lungs:  Clear with no wheezes, rales, or rhonchi.  HEART:  Regular rate rhythm, no murmurs, no rubs, no clicks Abd:  soft, positive bowel sounds, no organomegally, no rebound, no guarding Ext:  2 plus pulses, no edema, no cyanosis, no clubbing Skin:  No rashes no nodules Neuro:  CN II through XII intact, motor grossly intact   DEVICE  Normal device function.  See PaceArt for details.   Assess/Plan:

## 2012-03-05 NOTE — Assessment & Plan Note (Signed)
Her St. Jude dual-chamber pacemaker is working normally. We'll plan to recheck in several months. 

## 2012-03-05 NOTE — Patient Instructions (Addendum)
Your physician recommends that you schedule a follow-up appointment in: 6 weeks with Dr. Johney Frame.  Your physician wants you to follow-up in: 6 months with Dr. Ladona Ridgel.  You will receive a reminder letter in the mail two months in advance. If you don't receive a letter, please call our office to schedule the follow-up appointment.

## 2012-03-05 NOTE — Assessment & Plan Note (Signed)
She is maintaining sinus rhythm very nicely with some breakthroughs of atrial fibrillation. She will continue her current medical therapy. She will see Dr. Johney Frame back in approximately 6 weeks. I will see the patient in approximately 6 months.

## 2012-03-27 ENCOUNTER — Encounter (INDEPENDENT_AMBULATORY_CARE_PROVIDER_SITE_OTHER): Payer: BC Managed Care – PPO | Admitting: Ophthalmology

## 2012-03-27 DIAGNOSIS — H2510 Age-related nuclear cataract, unspecified eye: Secondary | ICD-10-CM

## 2012-03-27 DIAGNOSIS — I10 Essential (primary) hypertension: Secondary | ICD-10-CM

## 2012-03-27 DIAGNOSIS — H35039 Hypertensive retinopathy, unspecified eye: Secondary | ICD-10-CM

## 2012-03-27 DIAGNOSIS — H43819 Vitreous degeneration, unspecified eye: Secondary | ICD-10-CM

## 2012-03-27 DIAGNOSIS — H3530 Unspecified macular degeneration: Secondary | ICD-10-CM

## 2012-04-06 ENCOUNTER — Other Ambulatory Visit: Payer: Self-pay

## 2012-04-17 ENCOUNTER — Ambulatory Visit (INDEPENDENT_AMBULATORY_CARE_PROVIDER_SITE_OTHER): Payer: BC Managed Care – PPO | Admitting: Internal Medicine

## 2012-04-17 ENCOUNTER — Encounter: Payer: Self-pay | Admitting: Internal Medicine

## 2012-04-17 VITALS — BP 142/86 | HR 68 | Ht 64.0 in | Wt 171.8 lb

## 2012-04-17 DIAGNOSIS — I4891 Unspecified atrial fibrillation: Secondary | ICD-10-CM

## 2012-04-17 DIAGNOSIS — I480 Paroxysmal atrial fibrillation: Secondary | ICD-10-CM

## 2012-04-17 LAB — PACEMAKER DEVICE OBSERVATION
AL AMPLITUDE: 5 mv
AL IMPEDENCE PM: 400 Ohm
ATRIAL PACING PM: 1
BAMS-0001: 150 {beats}/min
BAMS-0003: 70 {beats}/min
BATTERY VOLTAGE: 2.96 V
BRDY-0002RV: 50 {beats}/min
BRDY-0003RV: 100 {beats}/min
BRDY-0004RV: 100 {beats}/min
DEVICE MODEL PM: 2285953
RV LEAD AMPLITUDE: 5.7 mv
RV LEAD IMPEDENCE PM: 410 Ohm
RV LEAD THRESHOLD: 1.25 V
VENTRICULAR PACING PM: 1

## 2012-04-17 NOTE — Patient Instructions (Addendum)
Your physician recommends that you schedule a follow-up appointment in: 2 months with Dr Allred  

## 2012-04-22 ENCOUNTER — Encounter: Payer: Self-pay | Admitting: Internal Medicine

## 2012-04-22 NOTE — Progress Notes (Signed)
HPI   [name]  returns today for additional evaluation. She is a very pleasant 67 year old woman with a history of paroxysmal atrial fibrillation, symptomatic tachycardia bradycardia syndrome, status post permanent pacemaker insertion. She was treated with multiple antiarrhythmic drugs and has over the years had worsening atrial fibrillation.  She recently underwent afib ablation.  She has done very well since that time.  She has had some ERAF but feels that this has improved.  She denies procedure related complications.  Allergies  Allergen Reactions  . Caudal Tray (Lidocaine-Epinephrine)     Edema    . Codeine     REACTION: nausea  . Diovan (Valsartan) Itching  . Doxycycline     Per pt: unknown  . Esomeprazole Magnesium     Headache    . Levofloxacin     Insomnia    . Neomycin-Bacitracin Zn-Polymyx     rash  . Penicillins     hives  . Sulfa Drugs Cross Reactors   . Verapamil     Edema   . Vimovo (Naproxen-Esomeprazole)     Upset stomach   . Zocor (Simvastatin)     Edema, and myalagias      Current Outpatient Prescriptions  Medication Sig Dispense Refill  . AMITIZA 8 MCG capsule Take 8 mcg by mouth as needed. As needed for constipation.      . ARTIFICIAL TEAR OP Place 1 drop into both eyes 2 (two) times daily as needed. For dry eyes      . Calcium Carbonate-Vit D-Min (CALCIUM 1200 PO) Take 1 tablet by mouth daily.       . carvedilol (COREG) 12.5 MG tablet TAKE 1 TABLET BY MOUTH IN THE MORNING , AND TAKE 1 & 1/2 TABLETS IN THE EVENING  75 tablet  5  . clobetasol cream (TEMOVATE) 0.05 % Apply 1 application topically daily as needed. As needed for Psoriasis.      . Coenzyme Q10 (CO Q 10) 100 MG CAPS Take 1 capsule by mouth daily.      . flecainide (TAMBOCOR) 100 MG tablet Take 100 mg by mouth 2 (two) times daily.       Marland Kitchen levothyroxine (SYNTHROID, LEVOTHROID) 100 MCG tablet Take 100 mcg by mouth daily.      Marland Kitchen LORazepam (ATIVAN) 0.5 MG tablet Take 1 mg by mouth at bedtime  as needed. For sleep      . magnesium oxide (MAG-OX 400) 400 MG tablet Take 1 tablet (400 mg total) by mouth daily.  30 tablet  11  . metFORMIN (GLUCOPHAGE-XR) 500 MG 24 hr tablet Take 500 mg by mouth daily with breakfast.       . methotrexate (RHEUMATREX) 2.5 MG tablet Take 10 mg by mouth once a week. Caution:Chemotherapy. Protect from light.      . Misc Natural Products (LEG VEIN & CIRCULATION) TABS Take 1 tablet by mouth daily.      . Multiple Vitamin (MULTIVITAMIN) tablet Take 1 tablet by mouth daily.        . Multiple Vitamins-Minerals (HAIR/SKIN/NAILS PO) Take 1 tablet by mouth every evening.      . multivitamin-lutein (OCUVITE-LUTEIN) CAPS Take 1 capsule by mouth 2 (two) times daily.      . NON FORMULARY ALL NATURAL MEMORY VITAMIN --- once daily      . omega-3 acid ethyl esters (LOVAZA) 1 G capsule Take 1 g by mouth 2 (two) times daily.      Marland Kitchen omeprazole (PRILOSEC) 40 MG capsule TAKE ONE CAPSULE  BY MOUTH TWICE A DAY  60 capsule  3  . ONE TOUCH ULTRA TEST test strip       . PARoxetine (PAXIL) 10 MG tablet Take 10 mg by mouth every evening.      Marland Kitchen PRESCRIPTION MEDICATION Take 1 tablet by mouth daily. Integra 125-40-3.      Marland Kitchen Rivaroxaban (XARELTO) 20 MG TABS Take 1 tablet (20 mg total) by mouth daily.  30 tablet  11  . rosuvastatin (CRESTOR) 10 MG tablet Take 10 mg by mouth daily.        Marland Kitchen torsemide (DEMADEX) 20 MG tablet Take 20 mg by mouth daily.       No current facility-administered medications for this visit.     Past Medical History  Diagnosis Date  . Impaired glucose tolerance   . Psoriasis   . IBS (irritable bowel syndrome)     vs diarrhea vs abd. fullness   . Chronic anxiety   . Uterine prolapse   . Bronchial spasms   . Hypertension   . Esophageal stricture   . Elevated blood sugar   . Diverticulosis   . Rectocele, female   . History of bladder repair surgery   . Hx of vaginal hysterectomy   . Vaginal prolapse 1998  . Hypothyroidism   . Psoriasis   . Tachy-brady  syndrome     a. 08/19/2008 s/p PPM: SJM 2110 Accent  . Complication of anesthesia     " I shake real bad "  . Family history of anesthesia complication     Daughter also shakes while waking up  . GERD (gastroesophageal reflux disease)   . H/O hiatal hernia   . Neuromuscular disorder     periferal neuropathy  . Anemia   . Paroxysmal atrial fibrillation     a. failed flecainide, tikosyn, amio;  b. 01/2012 s/p RFCA.  . H/O scarlet fever   . Pacemaker     St. Jude    Past Surgical History  Procedure Laterality Date  . Vaginal hysterectomy      prolapse   . Bladder suspension    . Rectocele repair    . Transthoracic echocardiogram  2008  . Pacemaker insertion      SJM  . Tee without cardioversion  02/06/2012    Procedure: TRANSESOPHAGEAL ECHOCARDIOGRAM (TEE);  Surgeon: Vesta Mixer, MD;  Location: [facility] ENDOSCOPY;  Service: Cardiovascular;  Laterality: N/A;     Family History  Problem Relation Age of Onset  . Breast cancer Sister   . Ovarian cancer Sister   . Uterine cancer Paternal Aunt   . Crohn's disease Other     neice  . Diabetes Mother   . Heart disease Mother   . Diabetes Maternal Grandmother   . Diabetes Sister   . Heart disease Father   . Heart disease Brother   . Heart disease Brother   . Colon cancer Neg Hx   . Stomach cancer Neg Hx      History   Social History  . Marital Status: Married    Spouse Name: N/A    Number of Children: 2  . Years of Education: N/A   Occupational History  . ADMINISTRATION    Social History Main Topics  . Smoking status: Never Smoker   . Smokeless tobacco: Never Used  . Alcohol Use: No  . Drug Use: No  . Sexually Active: Yes   Other Topics Concern  . Not on file   Social History Narrative  Lives in [place].  Works as a Financial controller Exam: Filed Vitals:   04/17/12 1532  BP: 142/86  Pulse: 68  Height: 5\' 4"  (1.626 m)  Weight: 171 lb 12.8 oz (77.928 kg)    GEN- The patient is well  appearing, alert and oriented x 3 today.   Head- normocephalic, atraumatic Eyes-  Sclera clear, conjunctiva pink Ears- hearing intact Oropharynx- clear Neck- supple, no JVP Lymph- no cervical lymphadenopathy Lungs- Clear to ausculation bilaterally, normal work of breathing Heart- Regular rate and rhythm, no murmurs, rubs or gallops, PMI not laterally displaced GI- soft, NT, ND, + BS Extremities- no clubbing, cyanosis, or edema Neuro- strength and sensation are intact  DEVICE  Normal device function.  See PaceArt for details.     Assess/Plan:

## 2012-04-22 NOTE — Assessment & Plan Note (Addendum)
Doing well s/p ablation  No changes today  Return in 2 months for further follow-up

## 2012-05-07 ENCOUNTER — Telehealth: Payer: Self-pay | Admitting: Family Medicine

## 2012-05-07 ENCOUNTER — Other Ambulatory Visit: Payer: Self-pay | Admitting: Family Medicine

## 2012-05-07 NOTE — Telephone Encounter (Signed)
PLEASE CALL ABOUT MEDS

## 2012-05-07 NOTE — Telephone Encounter (Signed)
States sees Dr Christell Constant and c/o staying hot and cold . Hair falling out . On thyroid meds wants dr Christell Constant to review this. Please call.

## 2012-05-09 ENCOUNTER — Telehealth: Payer: Self-pay | Admitting: *Deleted

## 2012-05-09 DIAGNOSIS — L659 Nonscarring hair loss, unspecified: Secondary | ICD-10-CM

## 2012-05-09 NOTE — Telephone Encounter (Signed)
Pt told to come in for thyroid profile with TSH due to new symptoms of hair loss, heat and cold intolerance.

## 2012-05-09 NOTE — Telephone Encounter (Signed)
As a result of the recent phone call, I would like for you to have another thyroid profile with a tsh

## 2012-05-09 NOTE — Telephone Encounter (Signed)
Pt informed to come by for thyroid profile with TSH.

## 2012-05-10 ENCOUNTER — Other Ambulatory Visit (INDEPENDENT_AMBULATORY_CARE_PROVIDER_SITE_OTHER): Payer: BC Managed Care – PPO

## 2012-05-10 DIAGNOSIS — E039 Hypothyroidism, unspecified: Secondary | ICD-10-CM

## 2012-05-10 LAB — THYROID PANEL WITH TSH
Free Thyroxine Index: 3.8 (ref 1.0–3.9)
T3 Uptake: 28.5 % (ref 22.5–37.0)
T4, Total: 13.2 ug/dL — ABNORMAL HIGH (ref 5.0–12.5)
TSH: 1.992 u[IU]/mL (ref 0.350–4.500)

## 2012-05-10 NOTE — Progress Notes (Signed)
Pt came in for labs only 

## 2012-05-17 ENCOUNTER — Telehealth: Payer: Self-pay | Admitting: Family Medicine

## 2012-05-17 MED ORDER — CIPROFLOXACIN HCL 500 MG PO TABS: 500.0000 mg | ORAL_TABLET | Freq: Two times a day (BID) | ORAL | Status: DC

## 2012-05-17 NOTE — Telephone Encounter (Signed)
Left message with pt's husband for her to return call

## 2012-05-17 NOTE — Telephone Encounter (Signed)
Pt called with complaints of urinary frequency and burning, wants antibiotic, has taken Cipro before

## 2012-05-20 NOTE — Telephone Encounter (Signed)
Pt notified. RX phoned in.

## 2012-05-22 ENCOUNTER — Telehealth: Payer: Self-pay | Admitting: Family Medicine

## 2012-05-22 NOTE — Telephone Encounter (Signed)
Tammy would you review daily her medications and see if you see anything in there that could be causing and playing a role with her hot flashes

## 2012-05-22 NOTE — Telephone Encounter (Signed)
Pt having continued hot flashes wanted to know if this could be caused by medications. Please call to advise

## 2012-05-23 NOTE — Telephone Encounter (Signed)
Reviewed patient's medications and there are some that can be associated with flushing.   Fleccanide can cause flushing - would recommend discussing with cardiologist. Carvedilol can also cause flushing but is rare.  Methotrexate can cause low folic acid levels which might lead to flushing but I would be surprised if patient if deficient in folic acid given the number of mulit vitamins she takes.  I would like to have a visit with patient and review all medications - might be able to simplify medication regimen.   Also would recommend consider f/u if GYN for flushing as well.

## 2012-05-24 ENCOUNTER — Telehealth: Payer: Self-pay | Admitting: Gastroenterology

## 2012-05-24 ENCOUNTER — Encounter: Payer: Self-pay | Admitting: Gastroenterology

## 2012-05-27 ENCOUNTER — Telehealth: Payer: Self-pay | Admitting: Family Medicine

## 2012-05-27 NOTE — Telephone Encounter (Signed)
Per patient she saw gynecologist last week.  He told her that she might be back in menopause.  Started he on estrogen patches - he gave her 2 weeks of samples.    I suggested she start estrogen patches and see if flushing/hot flashes improve.

## 2012-05-27 NOTE — Telephone Encounter (Signed)
See other phone encounter from 05/27/2012.  Duplicate encounters.

## 2012-05-27 NOTE — Telephone Encounter (Signed)
Pt of Dr Norval Gable with problems last year with r upper abdominal pain and the source was never found. ECL 08/11/11; diverticulosis and gastritis found. ABD u/s was negative. She began have cardiac problems and an ablation was done in December, 2013. She reports Friday she developed a pain in her back between her shoulder blades at her bra line that radiated around to under her breasts. She feels swollen and tender in these areas as well. She is some better today, but he pain is still present and is worse on the r. She does not think this is reflux because she has no burning up in her throat; she in on omeprazole BID. Dr Jarold Motto referred her to [facility] last year, but could not go d/t cardiac issues and now she can't get an appt until June. Pt was given an appt in am with Mike Gip, PA.

## 2012-05-28 ENCOUNTER — Ambulatory Visit (INDEPENDENT_AMBULATORY_CARE_PROVIDER_SITE_OTHER): Payer: BC Managed Care – PPO | Admitting: Physician Assistant

## 2012-05-28 ENCOUNTER — Other Ambulatory Visit: Payer: Self-pay | Admitting: Physician Assistant

## 2012-05-28 ENCOUNTER — Ambulatory Visit (HOSPITAL_COMMUNITY)
Admission: RE | Admit: 2012-05-28 | Discharge: 2012-05-28 | Disposition: A | Discharge status: Home / Self Care | Payer: BC Managed Care – PPO | Source: Ambulatory Visit | Attending: Physician Assistant | Admitting: Physician Assistant

## 2012-05-28 ENCOUNTER — Other Ambulatory Visit (INDEPENDENT_AMBULATORY_CARE_PROVIDER_SITE_OTHER): Payer: BC Managed Care – PPO

## 2012-05-28 ENCOUNTER — Encounter: Payer: Self-pay | Admitting: Physician Assistant

## 2012-05-28 VITALS — BP 110/76 | HR 68 | Temp 98.4°F | Ht 63.0 in | Wt 177.2 lb

## 2012-05-28 DIAGNOSIS — R531 Weakness: Secondary | ICD-10-CM

## 2012-05-28 DIAGNOSIS — R5381 Other malaise: Secondary | ICD-10-CM

## 2012-05-28 DIAGNOSIS — M549 Dorsalgia, unspecified: Secondary | ICD-10-CM | POA: Insufficient documentation

## 2012-05-28 DIAGNOSIS — K219 Gastro-esophageal reflux disease without esophagitis: Secondary | ICD-10-CM

## 2012-05-28 DIAGNOSIS — R509 Fever, unspecified: Secondary | ICD-10-CM

## 2012-05-28 DIAGNOSIS — M47814 Spondylosis without myelopathy or radiculopathy, thoracic region: Secondary | ICD-10-CM | POA: Insufficient documentation

## 2012-05-28 DIAGNOSIS — M546 Pain in thoracic spine: Secondary | ICD-10-CM | POA: Insufficient documentation

## 2012-05-28 DIAGNOSIS — IMO0002 Reserved for concepts with insufficient information to code with codable children: Secondary | ICD-10-CM | POA: Insufficient documentation

## 2012-05-28 DIAGNOSIS — Z950 Presence of cardiac pacemaker: Secondary | ICD-10-CM | POA: Insufficient documentation

## 2012-05-28 LAB — COMPREHENSIVE METABOLIC PANEL
ALT: 25 U/L (ref 0–35)
AST: 22 U/L (ref 0–37)
Albumin: 3.7 g/dL (ref 3.5–5.2)
Alkaline Phosphatase: 42 U/L (ref 39–117)
BUN: 19 mg/dL (ref 6–23)
CO2: 30 mEq/L (ref 19–32)
Calcium: 9.6 mg/dL (ref 8.4–10.5)
Chloride: 100 mEq/L (ref 96–112)
Creatinine, Ser: 0.7 mg/dL (ref 0.4–1.2)
GFR: 85.1 mL/min (ref >60.00)
Glucose, Bld: 115 mg/dL — ABNORMAL HIGH (ref 70–99)
Potassium: 4.3 mEq/L (ref 3.5–5.1)
Sodium: 139 mEq/L (ref 135–145)
Total Bilirubin: 0.9 mg/dL (ref 0.3–1.2)
Total Protein: 7.2 g/dL (ref 6.0–8.3)

## 2012-05-28 LAB — URINALYSIS, ROUTINE W REFLEX MICROSCOPIC
Bilirubin Urine: NEGATIVE
Hgb urine dipstick: NEGATIVE
Ketones, ur: NEGATIVE
Nitrite: NEGATIVE
Specific Gravity, Urine: 1.02 (ref 1.000–1.030)
Total Protein, Urine: NEGATIVE
Urine Glucose: NEGATIVE
Urobilinogen, UA: 0.2 (ref 0.0–1.0)
pH: 6 (ref 5.0–8.0)

## 2012-05-28 LAB — CBC WITH DIFFERENTIAL/PLATELET
Basophils Absolute: 0 10*3/uL (ref 0.0–0.1)
Basophils Relative: 0.2 % (ref 0.0–3.0)
Eosinophils Absolute: 0.3 10*3/uL (ref 0.0–0.7)
Eosinophils Relative: 3.1 % (ref 0.0–5.0)
HCT: 39.2 % (ref 36.0–46.0)
Hemoglobin: 13.4 g/dL (ref 12.0–15.0)
Lymphocytes Relative: 28.3 % (ref 12.0–46.0)
Lymphs Abs: 3.1 10*3/uL (ref 0.7–4.0)
MCHC: 34.3 g/dL (ref 30.0–36.0)
MCV: 89.3 fl (ref 78.0–100.0)
Monocytes Absolute: 1.2 10*3/uL — ABNORMAL HIGH (ref 0.1–1.0)
Monocytes Relative: 11 % (ref 3.0–12.0)
Neutro Abs: 6.2 10*3/uL (ref 1.4–7.7)
Neutrophils Relative %: 57.4 % (ref 43.0–77.0)
Platelets: 311 10*3/uL (ref 150.0–400.0)
RBC: 4.39 Mil/uL (ref 3.87–5.11)
RDW: 13.8 % (ref 11.5–14.6)
WBC: 10.8 10*3/uL — ABNORMAL HIGH (ref 4.5–10.5)

## 2012-05-28 NOTE — Progress Notes (Signed)
Subjective:    Patient ID: [name], female    DOB: [birth year], 67 y.o.   MRN: [id number]  HPI  [name] is a pleasant 67 year old female known to Dr. Jarold Motto who had been seen in the past 4 GERD gastritis and chronic constipation. She has history of atrial fibrillation for which she underwent a cardioversion in December of 2013 and also has tachybradycardia syndrome for which she has a pacemaker. She is maintained on Xarelto. She also has history of psoriasis and has taken methotrexate long-term. She had had a remote liver biopsy which showed mild changes consistent with Elita Boone without cirrhosis.  Her last colonoscopy was done in June of 2013 with finding of mild left-sided diverticulosis no polyps. Last EGD also done in June 2013 with a hiatal hernia and mild gastritis, H. pylori negative. She comes in today with complaints of episodes of "bad" knifelike pain that starts in her mid back and radiates around both sides into her abdomen she says that she usually on the worse on the right but radiates on both sides. She says when she gets those episodes she has a swollen inflamed feeling along her ribs and mid back. She had an episode this past weekend and says she had pain fairly constantly for a day and a half. Yesterday she developed fever at home ;100.4 and had  an episode of chills as well. She did not have any nausea or vomiting. She says this morning she's not had any further fever. She states that she has had pain in the midportion of her back over the past couple of months particularly noticeable with laying flat and says she's unable to sleep flat on her back she had a fall in September fell on the cement floor and on her right side but did not have any back injury that she is aware of at that time. She denies any real abdominal pain no changes in her bowel habits melena or hematochezia. Since onset of her fever she denies any dysuria urgency or frequency no cough sputum or other respiratory symptoms  and on questioning no other constitutional symptoms.    Review of Systems  Constitutional: Positive for fever, chills and fatigue.  HENT: Negative.   Eyes: Negative.   Respiratory: Negative.   Cardiovascular: Negative.   Gastrointestinal: Positive for abdominal pain.  Endocrine: Negative.   Genitourinary: Negative.   Musculoskeletal: Positive for back pain.  Skin: Negative.   Allergic/Immunologic: Negative.   Neurological: Positive for weakness.  Hematological: Negative.   Psychiatric/Behavioral: Negative.    Outpatient Prescriptions Prior to Visit  Medication Sig Dispense Refill  . AMITIZA 8 MCG capsule Take 8 mcg by mouth as needed. As needed for constipation.      . ARTIFICIAL TEAR OP Place 1 drop into both eyes 2 (two) times daily as needed. For dry eyes      . Calcium Carbonate-Vit D-Min (CALCIUM 1200 PO) Take 1 tablet by mouth daily.       . carvedilol (COREG) 12.5 MG tablet TAKE 1 TABLET BY MOUTH IN THE MORNING , AND TAKE 1 & 1/2 TABLETS IN THE EVENING  75 tablet  5  . ciprofloxacin (CIPRO) 500 MG tablet Take 1 tablet (500 mg total) by mouth 2 (two) times daily.  14 tablet  0  . clobetasol cream (TEMOVATE) 0.05 % Apply 1 application topically daily as needed. As needed for Psoriasis.      . Coenzyme Q10 (CO Q 10) 100 MG CAPS Take 1 capsule by  mouth daily.      . flecainide (TAMBOCOR) 100 MG tablet Take 100 mg by mouth 2 (two) times daily.       Marland Kitchen levothyroxine (SYNTHROID, LEVOTHROID) 100 MCG tablet Take 100 mcg by mouth daily.      Marland Kitchen LORazepam (ATIVAN) 0.5 MG tablet Take 1 mg by mouth at bedtime as needed. For sleep      . LOVAZA 1 G capsule TAKE 2 CAPSULE BY MOUTH TWICE A DAY  120 capsule  3  . magnesium oxide (MAG-OX 400) 400 MG tablet Take 1 tablet (400 mg total) by mouth daily.  30 tablet  11  . metFORMIN (GLUCOPHAGE-XR) 500 MG 24 hr tablet Take 500 mg by mouth daily with breakfast.       . methotrexate (RHEUMATREX) 2.5 MG tablet Take 10 mg by mouth once a week.  Caution:Chemotherapy. Protect from light.      . Misc Natural Products (LEG VEIN & CIRCULATION) TABS Take 1 tablet by mouth daily.      . Multiple Vitamin (MULTIVITAMIN) tablet Take 1 tablet by mouth daily.        . multivitamin-lutein (OCUVITE-LUTEIN) CAPS Take 1 capsule by mouth 2 (two) times daily.      Marland Kitchen omeprazole (PRILOSEC) 40 MG capsule TAKE ONE CAPSULE BY MOUTH TWICE A DAY  60 capsule  3  . ONE TOUCH ULTRA TEST test strip       . PARoxetine (PAXIL) 10 MG tablet Take 10 mg by mouth every evening.      Marland Kitchen PRESCRIPTION MEDICATION Take 1 tablet by mouth daily. Integra 125-40-3.      Marland Kitchen Rivaroxaban (XARELTO) 20 MG TABS Take 1 tablet (20 mg total) by mouth daily.  30 tablet  11  . rosuvastatin (CRESTOR) 10 MG tablet Take 10 mg by mouth daily.        Marland Kitchen torsemide (DEMADEX) 20 MG tablet Take 20 mg by mouth daily.       No facility-administered medications prior to visit.   Allergies  Allergen Reactions  . Caudal Tray (Lidocaine-Epinephrine)     Edema    . Codeine     REACTION: nausea  . Diovan (Valsartan) Itching  . Doxycycline     Per pt: unknown  . Esomeprazole Magnesium     Headache    . Levofloxacin     Insomnia    . Neomycin-Bacitracin Zn-Polymyx     rash  . Penicillins     hives  . Sulfa Drugs Cross Reactors   . Verapamil     Edema   . Vimovo (Naproxen-Esomeprazole)     Upset stomach   . Zocor (Simvastatin)     Edema, and myalagias    Patient Active Problem List  Diagnosis  . HYPOTHYROIDISM  . HYPERTENSION  . Atrial fibrillation  . PREMATURE VENTRICULAR CONTRACTIONS  . BRADYCARDIA  . IRRITABLE BOWEL SYNDROME  . OTHER CHRONIC NONALCOHOLIC LIVER DISEASE  . PSORIASIS  . FATIGUE  . EDEMA  . SNORING  . NONSPEC ELEVATION OF LEVELS OF TRANSAMINASE/LDH  . DIVERTICULOSIS, COLON, HX OF  . PPM-St.Jude  . PAF (paroxysmal atrial fibrillation)  . Tachy-brady syndrome  . Impaired glucose tolerance   History  Substance Use Topics  . Smoking status: Never Smoker    . Smokeless tobacco: Never Used  . Alcohol Use: No   family history includes Breast cancer in her sister; Crohn's disease in her other; Diabetes in her maternal grandmother, mother, and sister; Heart disease in her brothers, father, and  mother; Ovarian cancer in her sister; and Uterine cancer in her paternal aunt.  There is no history of Colon cancer and Stomach cancer.     Objective:   Physical Exam well-developed older white female in no acute distress, pleasant blood pressure 110/76 pulse 68 temp 98 4 height 5 foot 3 weight 177. HEENT; nontraumatic normocephalic EOMI PERRLA sclera anicteric, Neck;Supple no JVD, Cardiovascular; regular rate and rhythm with S1-S2 no murmur or gallop, Pulmonary; clear bilaterally she is tender with palpation of the lower thoracic spine and paraspinous muscles, Abdomen; soft nontender nondistended bowel sounds are active no palpable mass or hepatosplenomegaly she is tender bilaterally with palpation of the costal margins and lower anterior rib, Rectal; exam not done, Extremities; no clubbing cyanosis or edema skin warm and dry, Psych; mood and affect normal and appropriate        Assessment & Plan:  #66 67 year old female with complaints of mid back pain over the past 3 months somewhat progressive now with episodes of more intense pain starting in the mid back and radiating bilaterally around into the upper abdomen . Etiology is not clear and doubt that this is a GI related pain. And concern she may have a compression fracture with radicular pain or other vertebral process. #2 fever and chills times 24-hours-unclear if this is associated with her back pain or a separate process. #3 chronic anticoagulation with Xarelto #4 atrial fibrillation #5 tachybradycardia syndrome status post pacemaker #6 chronic methotrexate use for psoriasis #7 adult-onset diabetes mellitus #8 remote liver biopsy showing early Nash #9 chronic constipation  Plan; CBC with differential,  CM ET, UA today Chest x-ray Thoracic spine films Further workup pending results of above

## 2012-05-28 NOTE — Patient Instructions (Addendum)
Please go to the basement level to have your labs drawn.  The orders are in for the x-rays, chest and Thorasic spine. You can go to [facility] Radiology, 1st floor . You don't need a specific appointment.

## 2012-05-29 ENCOUNTER — Ambulatory Visit: Payer: BC Managed Care – PPO

## 2012-05-29 DIAGNOSIS — M549 Dorsalgia, unspecified: Secondary | ICD-10-CM

## 2012-05-29 DIAGNOSIS — R509 Fever, unspecified: Secondary | ICD-10-CM

## 2012-05-30 LAB — URINE CULTURE
Colony Count: NO GROWTH
Organism ID, Bacteria: NO GROWTH

## 2012-06-03 ENCOUNTER — Encounter: Payer: Self-pay | Admitting: Pharmacist

## 2012-06-03 ENCOUNTER — Ambulatory Visit (INDEPENDENT_AMBULATORY_CARE_PROVIDER_SITE_OTHER): Payer: BC Managed Care – PPO | Admitting: Pharmacist

## 2012-06-03 ENCOUNTER — Encounter: Payer: BC Managed Care – PPO | Admitting: *Deleted

## 2012-06-03 DIAGNOSIS — E039 Hypothyroidism, unspecified: Secondary | ICD-10-CM

## 2012-06-03 MED ORDER — LEVOTHYROXINE SODIUM 88 MCG PO TABS: 88.0000 ug | ORAL_TABLET | Freq: Every day | ORAL | Status: DC

## 2012-06-03 NOTE — Progress Notes (Signed)
Patient ID: [name], female   DOB: [birth year], 67 y.o.   MRN: [id number]  Chief Complaint  Patient presents with  . Medication Problem    HPI:  Patient is concerned that medication might be causing her hot flashes/flushing.  Additionally she would like to decrease the number of medication she is currently taking She has been experiencing these symptoms for 3 to 4 months.  Occur anytime during the day - has fan on her at work and at home.  Very hot at night during sleep.   Patient saw her OBGYN last week and was prescribed estrogen patch - minivelle 0.05mg  patch.  She started wearing the patch Wednesday, April 9th.   She forgot thyroid medication Saturday, April 12th and did not have problems with feeling hot.   Current medications include:  Levothyroxine qd, lovaza 2 grams daily, Women's MVI qd, regular MVI qd, Eye vitamin with Lutin, MVI for Memory, Hair and nail vitamin.  MetforminXR 500mg  qd, amitiza bid as needed for constipation, artificial tear eye drop as needed for dry eyes, citracal petites 2 tabs daily, carvedilol 12.5 mg 1 qam and 1.5 qpm, clobetasol 0.05% caream AAA prn, co enzyme Q10 100mg  qd, flecainide 100mg  bid as needed, lorazepam 1mg  qhs as needed, mag ox 400mg  daily, omeprazole 40mg  bid, one touch ultra test strips, paxil 10mg  1 tablet each evening, xarelto 20mg  qd, crestor 10mg  qd, demadex 20mg  qd  has HYPOTHYROIDISM; HYPERTENSION; Atrial fibrillation; PREMATURE VENTRICULAR CONTRACTIONS; BRADYCARDIA; IRRITABLE BOWEL SYNDROME; OTHER CHRONIC NONALCOHOLIC LIVER DISEASE; PSORIASIS; FATIGUE; EDEMA; SNORING; NONSPEC ELEVATION OF LEVELS OF TRANSAMINASE/LDH; DIVERTICULOSIS, COLON, HX OF; PPM-St.Jude; PAF (paroxysmal atrial fibrillation); Tachy-brady syndrome; and Impaired glucose tolerance on her problem list.    T4, Total 5.0 - 12.5 ug/dL           40.9W T3 Uptake 22.5 - 37.0 %             28.5% Free Thyroxine Index 1.0 - 3.9    3.8 TSH 0.350 - 4.500 uIU/mL           1.992  A:  Flushing - possibly worsened by elevated T4, medications or menopause      Medication Review - Too many MVI type supplements  P: continue with minivelle 0.05mg  patches - need more time to see full effects     Decrease synthroid to qd     Stop women's MVI and regular MVI.      Hold lovaza and Integra - patient is due labs in 4 weeks, may be able to do without these medications     RTC 4 weeks - recheck Thyroid panel.       Henrene Pastor, PharmD, CPP

## 2012-06-05 ENCOUNTER — Ambulatory Visit (INDEPENDENT_AMBULATORY_CARE_PROVIDER_SITE_OTHER): Payer: BC Managed Care – PPO | Admitting: Nurse Practitioner

## 2012-06-05 ENCOUNTER — Other Ambulatory Visit (INDEPENDENT_AMBULATORY_CARE_PROVIDER_SITE_OTHER): Payer: BC Managed Care – PPO

## 2012-06-05 ENCOUNTER — Telehealth: Payer: Self-pay | Admitting: Physician Assistant

## 2012-06-05 ENCOUNTER — Encounter: Payer: Self-pay | Admitting: Nurse Practitioner

## 2012-06-05 VITALS — BP 128/78 | HR 83 | Ht 63.0 in | Wt 173.0 lb

## 2012-06-05 DIAGNOSIS — R509 Fever, unspecified: Secondary | ICD-10-CM

## 2012-06-05 DIAGNOSIS — R1084 Generalized abdominal pain: Secondary | ICD-10-CM

## 2012-06-05 LAB — CBC WITH DIFFERENTIAL/PLATELET
Basophils Absolute: 0 10*3/uL (ref 0.0–0.1)
Basophils Relative: 0.1 % (ref 0.0–3.0)
Eosinophils Absolute: 0.4 10*3/uL (ref 0.0–0.7)
Eosinophils Relative: 2.9 % (ref 0.0–5.0)
HCT: 40.4 % (ref 36.0–46.0)
Hemoglobin: 13.5 g/dL (ref 12.0–15.0)
Lymphocytes Relative: 26.9 % (ref 12.0–46.0)
Lymphs Abs: 3.5 10*3/uL (ref 0.7–4.0)
MCHC: 33.5 g/dL (ref 30.0–36.0)
MCV: 90.2 fl (ref 78.0–100.0)
Monocytes Absolute: 1.6 10*3/uL — ABNORMAL HIGH (ref 0.1–1.0)
Monocytes Relative: 12.4 % — ABNORMAL HIGH (ref 3.0–12.0)
Neutro Abs: 7.6 10*3/uL (ref 1.4–7.7)
Neutrophils Relative %: 57.7 % (ref 43.0–77.0)
Platelets: 347 10*3/uL (ref 150.0–400.0)
RBC: 4.47 Mil/uL (ref 3.87–5.11)
RDW: 13.6 % (ref 11.5–14.6)
WBC: 13.2 10*3/uL — ABNORMAL HIGH (ref 4.5–10.5)

## 2012-06-05 NOTE — Telephone Encounter (Signed)
Patient states the fever(101 last night) and upper abdominal pain are back. States she is not having nausea or vomiting. She does report constipation and abdominal cramping. She did have a small bowel movement. Also, reports a sore feeling between breasts. Scheduled with Willette Cluster, NP today at 1:30 PM.

## 2012-06-05 NOTE — Patient Instructions (Addendum)
Please go to the basement level to have your labs drawn.  Make an appointment to see Dr. Juanda Chance in 2 weeks.  You have been scheduled for a CT scan of the abdomen and pelvis at [facility] ([address]---this is in the same building as [facility]).   You are scheduled on Friday 06-07-2012 at 8:30 am . You should arrive at 8:15 AM  prior to your appointment time for registration. Please follow the written instructions below on the day of your exam:  WARNING: IF YOU ARE ALLERGIC TO IODINE/X-RAY DYE, PLEASE NOTIFY RADIOLOGY IMMEDIATELY AT [phone]! YOU WILL BE GIVEN A 13 HOUR PREMEDICATION PREP.  1) Do not eat or drink anything after 4:30 am  (4 hours prior to your test) 2) You have been given 2 bottles of oral contrast to drink. The solution may taste better if refrigerated, but do NOT add ice or any other liquid to this solution. Shake well before drinking.    Drink 1 bottle of contrast @ 6:30 am  (2 hours prior to your exam)  Drink 1 bottle of contrast @ 7:30 am  (1 hour prior to your exam)  You may take any medications as prescribed with a small amount of water except for the following: Metformin, Glucophage, Glucovance, Avandamet, Riomet, Fortamet, Actoplus Met, Janumet, Glumetza or Metaglip. The above medications must be held the day of the exam AND 48 hours after the exam.  The purpose of you drinking the oral contrast is to aid in the visualization of your intestinal tract. The contrast solution may cause some diarrhea. Before your exam is started, you will be given a small amount of fluid to drink. Depending on your individual set of symptoms, you may also receive an intravenous injection of x-ray contrast/dye. Plan on being at [facility] for 30 minutes or long, depending on the type of exam you are having performed.  If you have any questions regarding your exam or if you need to reschedule, you may call the CT department at [phone] between the hours  of 8:00 am and 5:00 pm, Monday-Friday.  ________________________________________________________________________

## 2012-06-05 NOTE — Progress Notes (Signed)
06/05/2012 [name] [id number] [birth year]   History of Present Illness:   Patient is a 67 year old female known to Dr. Jarold Motto. She has multiple medical problems, is on multiple medications and has multiple allergies. Patient has a history of atrial fibrillation, status post cardioversion December 2013. She has a pacemaker and is on Xarelto.    Patient was just seen in the office on the eighth of this month for evaluation of abdominal pain, back pain and fevers. Her symptoms were not felt to be GI related. Labs, urinalysis, chest x-ray and thoracic spine films were obtained. Comprehensive metabolic profile was unremarkable, white count slightly elevated at 10.8. Hemoglobin normal.  Thoracic spine x-ray showed changes of degenerative disc disease and spondylosis as well as osteophyte formation in the spine. Her chest x-ray was unrevealing. Her urine showed a few white blood cells,  urine culture was negative. Her fever resolved and back pain improved until last night when she had a fever of 101.    Patient  complains of epigastric pain radiating straight through to back as well as around both sides. Pain worse if she overeats. She had chronic back pain. No significant nausea. No diarrhea. She is chronically constipated. She describes chronic 'stomach problems" and states it is difficult to distinguish what is new. Her last EGD was June 2013 with findings of moderate gastritis antral erosions. She has been on BID PPI since EGD   Current Medications, Allergies, Past Medical History, Past Surgical History, Family History and Social History were reviewed in [employer] record.   Physical Exam: General: Well developed , white female in no acute distress Head: Normocephalic and atraumatic Eyes:  sclerae anicteric, conjunctiva pink  Ears: Normal auditory acuity Lungs: Clear throughout to auscultation Heart: Regular rate and rhythm Abdomen: Soft, non distended, mild to  moderate tenderness in epigastrium and LLQ. Marland Kitchen No masses, no hepatomegaly. Normal bowel sounds Musculoskeletal: Symmetrical with no gross deformities  Extremities: No edema  Neurological: Alert oriented x 4, grossly nonfocal Psychological:  Alert and cooperative. Normal mood and affect  Assessment and Recommendations:  Multiple vague abdominal / flank and back complaints. She is tender in epigastrium but also in LLQ. She is still running fevers at home, this is her 2nd visit here this month. Recent u/a and CXR normal recently but WBC mildly elevated. Difficult to know what is going on given all the areas of pain she describes but ongoing fevers are concerning. Will obtain a CTscan of abd/ pelvis with contrast for further evaluation. Will recheck CBC today.  Patient will be called with results and any further recommendations. She will follow up with Dr. Jarold Motto. Patient knows to call in the interim if symptoms worsen.

## 2012-06-07 ENCOUNTER — Ambulatory Visit (INDEPENDENT_AMBULATORY_CARE_PROVIDER_SITE_OTHER)
Admission: RE | Admit: 2012-06-07 | Discharge: 2012-06-07 | Disposition: A | Discharge status: Home / Self Care | Payer: BC Managed Care – PPO | Source: Ambulatory Visit | Attending: Nurse Practitioner | Admitting: Nurse Practitioner

## 2012-06-07 DIAGNOSIS — R509 Fever, unspecified: Secondary | ICD-10-CM

## 2012-06-07 DIAGNOSIS — R1084 Generalized abdominal pain: Secondary | ICD-10-CM

## 2012-06-07 MED ORDER — IOHEXOL 300 MG/ML  SOLN
100.0000 mL | Freq: Once | INTRAMUSCULAR | Status: AC | PRN
  Administered 2012-06-07: 100 mL via INTRAVENOUS

## 2012-06-08 ENCOUNTER — Encounter: Payer: Self-pay | Admitting: Internal Medicine

## 2012-06-10 ENCOUNTER — Other Ambulatory Visit: Payer: Self-pay | Admitting: Nurse Practitioner

## 2012-06-10 MED ORDER — METRONIDAZOLE 250 MG PO TABS: 250.0000 mg | ORAL_TABLET | Freq: Three times a day (TID) | ORAL | Status: DC

## 2012-06-10 MED ORDER — PROMETHAZINE HCL 12.5 MG PO TABS: 12.5000 mg | ORAL_TABLET | Freq: Four times a day (QID) | ORAL | Status: DC | PRN

## 2012-06-10 MED ORDER — CIPROFLOXACIN HCL 500 MG PO TABS: 500.0000 mg | ORAL_TABLET | Freq: Two times a day (BID) | ORAL | Status: DC

## 2012-06-11 ENCOUNTER — Telehealth: Payer: Self-pay

## 2012-06-11 NOTE — Telephone Encounter (Signed)
See CT result note for further documentation.

## 2012-06-11 NOTE — Telephone Encounter (Signed)
Message copied by Chrystie Nose on Tue Jun 11, 2012  3:25 PM ------      Message from: Brison Fiumara, [place] R      Created: Tue Jun 11, 2012  3:23 PM      Regarding: Antibiotics       Pt had multiple allergies to antibiotics. Spoke with Willette Cluster NP and Cipro, Flagyl, and Phenergan sent to the pharmacy for pt. Pt states Cipro caused her to have nausea, she was instructed to take phenergan for the nausea. Pt verbalized understanding. ------

## 2012-06-13 ENCOUNTER — Telehealth: Payer: Self-pay | Admitting: Nurse Practitioner

## 2012-06-13 NOTE — Telephone Encounter (Signed)
Patient states she is extremely nervous and has not slept for 2 nights. She is taking Ativan trying to "calm it down." She thinks the Flagyl is causing this.states she can stand feeling this nervous. Also, states she has been having pain on the right side to lower back. Feels like someone is putting a fist in her lower back.She thinks is could be a muscle spasm. Started on Tuesday. She is using ice and heat to it. Please, advise.

## 2012-06-14 NOTE — Telephone Encounter (Signed)
Per Willette Cluster, NP, patient should try something to help her sleep. She needs to take the Flagyl. If she cannot take it, will need OV with primary GI MD. Sherron Monday with patient and she will try to stay on it and let us know on Monday if she cannot continue.

## 2012-06-19 ENCOUNTER — Encounter: Payer: Self-pay | Admitting: Internal Medicine

## 2012-06-19 ENCOUNTER — Ambulatory Visit (INDEPENDENT_AMBULATORY_CARE_PROVIDER_SITE_OTHER): Payer: BC Managed Care – PPO | Admitting: Internal Medicine

## 2012-06-19 VITALS — BP 156/83 | HR 62 | Ht 63.0 in | Wt 175.0 lb

## 2012-06-19 DIAGNOSIS — Z950 Presence of cardiac pacemaker: Secondary | ICD-10-CM

## 2012-06-19 DIAGNOSIS — I10 Essential (primary) hypertension: Secondary | ICD-10-CM

## 2012-06-19 DIAGNOSIS — I498 Other specified cardiac arrhythmias: Secondary | ICD-10-CM

## 2012-06-19 DIAGNOSIS — I4891 Unspecified atrial fibrillation: Secondary | ICD-10-CM

## 2012-06-19 LAB — PACEMAKER DEVICE OBSERVATION
AL AMPLITUDE: 5 mv
AL IMPEDENCE PM: 360 Ohm
AL THRESHOLD: 0.75 V
ATRIAL PACING PM: 1
BAMS-0001: 150 {beats}/min
BAMS-0003: 70 {beats}/min
BATTERY VOLTAGE: 2.95 V
DEVICE MODEL PM: 2285953
RV LEAD AMPLITUDE: 6.6 mv
RV LEAD IMPEDENCE PM: 400 Ohm
RV LEAD THRESHOLD: 1.25 V
VENTRICULAR PACING PM: 1

## 2012-06-19 NOTE — Assessment & Plan Note (Signed)
Normal pacemaker function See Pace Art report No changes today  

## 2012-06-19 NOTE — Assessment & Plan Note (Signed)
She has symptomatic recurrent atrial fibrillation not improved with flecainide. Therapeutic strategies for afib including medicine and ablation were discussed in detail with the patient today. Risk, benefits, and alternatives to repeat EP study and radiofrequency ablation for afib were also discussed in detail today. These risks include but are not limited to stroke, bleeding, vascular damage, tamponade, perforation, damage to the esophagus, lungs, and other structures, pulmonary vein stenosis, worsening renal function, pacemaker lead dislodgement, and death. The patient understands these risk and wishes to proceed.  We will therefore proceed with catheter ablation at the next available time

## 2012-06-19 NOTE — Assessment & Plan Note (Signed)
Stable No change required today  

## 2012-06-19 NOTE — Progress Notes (Signed)
HPI   [name]  returns today for additional evaluation. She is a very pleasant 66-year-old woman with a history of paroxysmal atrial fibrillation, symptomatic tachycardia bradycardia syndrome, status post permanent pacemaker insertion. She was treated with multiple antiarrhythmic drugs and has over the years had worsening atrial fibrillation.  She underwent afib ablation by me 12/13.  Unfortunatley, she continues to have afib.  She reports symptoms of palpitations and fatigue.  She denies CP, SOB, dizziness, presyncope, or syncope.   Allergies  Allergen Reactions  . Caudal Tray (Lidocaine-Epinephrine)     Edema    . Ciprofloxacin Hcl Nausea Only  . Codeine     REACTION: nausea  . Diovan (Valsartan) Itching  . Doxycycline     Per pt: unknown  . Esomeprazole Magnesium     Headache    . Levofloxacin     Insomnia    . Neomycin-Bacitracin Zn-Polymyx     rash  . Penicillins     hives  . Sulfa Drugs Cross Reactors   . Verapamil     Edema   . Vimovo (Naproxen-Esomeprazole)     Upset stomach   . Zocor (Simvastatin)     Edema, and myalagias      Current Outpatient Prescriptions  Medication Sig Dispense Refill  . AMITIZA 8 MCG capsule Take 8 mcg by mouth as needed. As needed for constipation.      . ARTIFICIAL TEAR OP Place 1 drop into both eyes 2 (two) times daily as needed. For dry eyes      . Calcium Citrate-Vitamin D (CITRACAL PETITES/VITAMIN D PO) Take 2 tablets by mouth daily.      . carvedilol (COREG) 12.5 MG tablet TAKE 1 TABLET BY MOUTH IN THE MORNING , AND TAKE 1 & 1/2 TABLETS IN THE EVENING  75 tablet  5  . ciprofloxacin (CIPRO) 500 MG tablet Take 1 tablet (500 mg total) by mouth 2 (two) times daily.  20 tablet  0  . clobetasol cream (TEMOVATE) 0.05 % Apply 1 application topically daily as needed. As needed for Psoriasis.      . flecainide (TAMBOCOR) 100 MG tablet Take 100 mg by mouth 2 (two) times daily as needed.       . levothyroxine (SYNTHROID) 88 MCG tablet Take  1 tablet (88 mcg total) by mouth daily before breakfast.  42 tablet  0  . LORazepam (ATIVAN) 0.5 MG tablet Take 1 mg by mouth at bedtime as needed. For sleep      . magnesium oxide (MAG-OX 400) 400 MG tablet Take 1 tablet (400 mg total) by mouth daily.  30 tablet  11  . metFORMIN (GLUCOPHAGE-XR) 500 MG 24 hr tablet Take 500 mg by mouth daily with breakfast.       . methotrexate (RHEUMATREX) 2.5 MG tablet Take 10 mg by mouth once a week. Caution:Chemotherapy. Protect from light.      . metroNIDAZOLE (FLAGYL) 250 MG tablet Take 1 tablet (250 mg total) by mouth 3 (three) times daily.  30 tablet  0  . multivitamin-lutein (OCUVITE-LUTEIN) CAPS Take 1 capsule by mouth 2 (two) times daily.      . omeprazole (PRILOSEC) 40 MG capsule TAKE ONE CAPSULE BY MOUTH TWICE A DAY  60 capsule  3  . ONE TOUCH ULTRA TEST test strip       . PARoxetine (PAXIL) 10 MG tablet Take 10 mg by mouth every evening.      . promethazine (PHENERGAN) 12.5 MG tablet Take 1 tablet (12.5   mg total) by mouth every 6 (six) hours as needed for nausea.  30 tablet  0  . Rivaroxaban (XARELTO) 20 MG TABS Take 1 tablet (20 mg total) by mouth daily.  30 tablet  11  . rosuvastatin (CRESTOR) 10 MG tablet Take 10 mg by mouth daily.        . torsemide (DEMADEX) 20 MG tablet Take 20 mg by mouth daily.       No current facility-administered medications for this visit.     Past Medical History  Diagnosis Date  . Impaired glucose tolerance   . Psoriasis   . IBS (irritable bowel syndrome)     vs diarrhea vs abd. fullness   . Chronic anxiety   . Uterine prolapse   . Bronchial spasms   . Hypertension   . Esophageal stricture   . Elevated blood sugar   . Diverticulosis   . Rectocele, female   . History of bladder repair surgery   . Hx of vaginal hysterectomy   . Vaginal prolapse 1998  . Hypothyroidism   . Psoriasis   . Tachy-brady syndrome     a. 08/19/2008 s/p PPM: SJM 2110 Accent  . Complication of anesthesia     " I shake real bad "   . Family history of anesthesia complication     Daughter also shakes while waking up  . GERD (gastroesophageal reflux disease)   . H/O hiatal hernia   . Neuromuscular disorder     periferal neuropathy  . Anemia   . Paroxysmal atrial fibrillation     a. failed flecainide, tikosyn, amio;  b. 01/2012 s/p RFCA.  . H/O scarlet fever   . Pacemaker     St. Jude    Past Surgical History  Procedure Laterality Date  . Vaginal hysterectomy      prolapse   . Bladder suspension    . Rectocele repair    . Transthoracic echocardiogram  2008  . Pacemaker insertion      SJM  . Tee without cardioversion  02/06/2012    Procedure: TRANSESOPHAGEAL ECHOCARDIOGRAM (TEE);  Surgeon: Philip J Nahser, MD;  Location: [facility];  Service: Cardiovascular;  Laterality: N/A;  . Atrial fibrillation ablation  02/05/13    PVI by Dr Esley Brooking     Family History  Problem Relation Age of Onset  . Breast cancer Sister   . Ovarian cancer Sister   . Uterine cancer Paternal Aunt   . Crohn's disease Other     neice  . Diabetes Mother   . Heart disease Mother   . Diabetes Maternal Grandmother   . Diabetes Sister   . Heart disease Father   . Heart disease Brother   . Heart disease Brother   . Colon cancer Neg Hx   . Stomach cancer Neg Hx      History   Social History  . Marital Status: Married    Spouse Name: N/A    Number of Children: 2  . Years of Education: N/A   Occupational History  . ADMINISTRATION    Social History Main Topics  . Smoking status: Never Smoker   . Smokeless tobacco: Never Used  . Alcohol Use: No  . Drug Use: No  . Sexually Active: Yes   Other Topics Concern  . Not on file   Social History Narrative   Lives in [place].  Works as a receptionist   Physical Exam: Filed Vitals:   06/19/12 1535  BP: 156/83  Pulse: 62    Height: 5' 3" (1.6 m)  Weight: 175 lb (79.379 kg)    GEN- The patient is well appearing, alert and oriented x 3 today.   Head-  normocephalic, atraumatic Eyes-  Sclera clear, conjunctiva pink Ears- hearing intact Oropharynx- clear Neck- supple, no JVP Lymph- no cervical lymphadenopathy Lungs- Clear to ausculation bilaterally, normal work of breathing Heart- Regular rate and rhythm, no murmurs, rubs or gallops, PMI not laterally displaced GI- soft, NT, ND, + BS Extremities- no clubbing, cyanosis, or edema Neuro- strength and sensation are intact  DEVICE  Normal device function.  See PaceArt for details.   Assessment/Plan:  

## 2012-06-19 NOTE — Patient Instructions (Addendum)
Your physician has recommended that you have an ablation. Catheter ablation is a medical procedure used to treat some cardiac arrhythmias (irregular heartbeats). During catheter ablation, a long, thin, flexible tube is put into a blood vessel in your groin (upper thigh), or neck. This tube is called an ablation catheter. It is then guided to your heart through the blood vessel. Radio frequency waves destroy small areas of heart tissue where abnormal heartbeats may cause an arrhythmia to start. Please see the instruction sheet given to you today.    See instruction sheet--will send to patient

## 2012-06-20 ENCOUNTER — Other Ambulatory Visit: Payer: Self-pay | Admitting: *Deleted

## 2012-06-20 ENCOUNTER — Encounter: Payer: Self-pay | Admitting: *Deleted

## 2012-06-20 DIAGNOSIS — I4891 Unspecified atrial fibrillation: Secondary | ICD-10-CM

## 2012-06-27 ENCOUNTER — Encounter (HOSPITAL_COMMUNITY): Payer: Self-pay | Admitting: Pharmacy Technician

## 2012-06-27 ENCOUNTER — Ambulatory Visit (INDEPENDENT_AMBULATORY_CARE_PROVIDER_SITE_OTHER): Payer: BC Managed Care – PPO | Admitting: Family Medicine

## 2012-06-27 ENCOUNTER — Encounter: Payer: Self-pay | Admitting: Family Medicine

## 2012-06-27 VITALS — BP 146/76 | HR 64 | Temp 97.5°F | Ht 62.0 in | Wt 175.6 lb

## 2012-06-27 DIAGNOSIS — R5381 Other malaise: Secondary | ICD-10-CM

## 2012-06-27 DIAGNOSIS — L659 Nonscarring hair loss, unspecified: Secondary | ICD-10-CM

## 2012-06-27 DIAGNOSIS — J309 Allergic rhinitis, unspecified: Secondary | ICD-10-CM

## 2012-06-27 DIAGNOSIS — I10 Essential (primary) hypertension: Secondary | ICD-10-CM

## 2012-06-27 DIAGNOSIS — R5383 Other fatigue: Secondary | ICD-10-CM

## 2012-06-27 DIAGNOSIS — E039 Hypothyroidism, unspecified: Secondary | ICD-10-CM

## 2012-06-27 DIAGNOSIS — E785 Hyperlipidemia, unspecified: Secondary | ICD-10-CM

## 2012-06-27 LAB — POCT CBC
Granulocyte percent: 63.1 %G (ref 37–80)
HCT, POC: 38.2 % (ref 37.7–47.9)
Hemoglobin: 12.7 g/dL (ref 12.2–16.2)
Lymph, poc: 2.3 (ref 0.6–3.4)
MCH, POC: 29.8 pg (ref 27–31.2)
MCHC: 33.2 g/dL (ref 31.8–35.4)
MCV: 89.9 fL (ref 80–97)
MPV: 6.3 fL (ref 0–99.8)
POC Granulocyte: 4.8 (ref 2–6.9)
POC LYMPH PERCENT: 30.8 %L (ref 10–50)
Platelet Count, POC: 325 10*3/uL (ref 142–424)
RBC: 4.3 M/uL (ref 4.04–5.48)
RDW, POC: 14.3 %
WBC: 7.6 10*3/uL (ref 4.6–10.2)

## 2012-06-27 LAB — BASIC METABOLIC PANEL WITH GFR
BUN: 12 mg/dL (ref 6–23)
CO2: 30 mEq/L (ref 19–32)
Calcium: 9.9 mg/dL (ref 8.4–10.5)
Chloride: 104 mEq/L (ref 96–112)
Creat: 0.63 mg/dL (ref 0.50–1.10)
GFR, Est African American: >89 mL/min
GFR, Est Non African American: >89 mL/min
Glucose, Bld: 116 mg/dL — ABNORMAL HIGH (ref 70–99)
Potassium: 4.3 mEq/L (ref 3.5–5.3)
Sodium: 144 mEq/L (ref 135–145)

## 2012-06-27 LAB — HEPATIC FUNCTION PANEL
ALT: 28 U/L (ref 0–35)
AST: 24 U/L (ref 0–37)
Albumin: 4.2 g/dL (ref 3.5–5.2)
Alkaline Phosphatase: 35 U/L — ABNORMAL LOW (ref 39–117)
Bilirubin, Direct: 0.1 mg/dL (ref 0.0–0.3)
Indirect Bilirubin: 0.4 mg/dL (ref 0.0–0.9)
Total Bilirubin: 0.5 mg/dL (ref 0.3–1.2)
Total Protein: 6.5 g/dL (ref 6.0–8.3)

## 2012-06-27 LAB — THYROID PANEL WITH TSH
Free Thyroxine Index: 3.5 (ref 1.0–3.9)
T3 Uptake: 28.6 % (ref 22.5–37.0)
T4, Total: 12.4 ug/dL (ref 5.0–12.5)
TSH: 1.963 u[IU]/mL (ref 0.350–4.500)

## 2012-06-27 MED ORDER — FLUTICASONE PROPIONATE 50 MCG/ACT NA SUSP: 2.0000 | Freq: Every day | NASAL | Status: DC

## 2012-06-27 NOTE — Progress Notes (Signed)
Subjective:    Patient ID: [name], female    DOB: 1945-11-19, 67 y.o.   MRN: [id number]  HPI This patient presents for recheck of multiple medical problems. No one accompanies the patient today.  Patient Active Problem List   Diagnosis Date Noted  . Fever, unspecified 06/05/2012  . Generalized abdominal pain 06/05/2012  . PAF (paroxysmal atrial fibrillation) 02/07/2012  . Tachy-brady syndrome   . Impaired glucose tolerance   . FATIGUE 12/22/2009  . SNORING 12/22/2009  . OTHER CHRONIC NONALCOHOLIC LIVER DISEASE 10/29/2009  . NONSPEC ELEVATION OF LEVELS OF TRANSAMINASE/LDH 03/09/2009  . PPM-St.Jude 09/01/2008  . BRADYCARDIA 08/13/2008  . HYPOTHYROIDISM 06/09/2008  . HYPERTENSION 06/09/2008  . Atrial fibrillation 06/09/2008  . PREMATURE VENTRICULAR CONTRACTIONS 06/09/2008  . IRRITABLE BOWEL SYNDROME 06/09/2008  . PSORIASIS 06/09/2008  . EDEMA 06/09/2008  . DIVERTICULOSIS, COLON, HX OF 06/09/2008    In addition, some increased allergy symptoms. He is also seeing Dr. Johney Frame and will have an ablation next week. All the orders are in place for this to be done. She also has had a recent bout of diverticulitis and has finished the antibiotic treatment. She says her normal pain is better. She had right flank pain which has since improved. Also as of note, reducing the thyroid medicine, she is not sure if this made any difference with the hot flashes.   The allergies, current medications, past medical history, surgical history, family and social history are reviewed.  Immunizations reviewed.  Health maintenance reviewed.  The following items are outstanding: None      Review of Systems  Constitutional: Positive for fatigue.  HENT: Positive for sneezing (due to allergies).   Eyes: Positive for visual disturbance (R eye, watering).  Respiratory: Negative.   Cardiovascular: Positive for palpitations (a fib).  Gastrointestinal: Positive for abdominal pain and constipation  (occasional).  Endocrine: Positive for heat intolerance (x 6 mths).  Genitourinary: Negative.   Musculoskeletal: Positive for back pain (LBP, Right side) and arthralgias (bilateral shoulders).  Skin: Positive for rash (psoriasis).  Allergic/Immunologic: Positive for environmental allergies (seasonal).  Neurological: Negative.   Psychiatric/Behavioral: Positive for sleep disturbance (nightly, Ativan helps). The patient is nervous/anxious (occasional).        Objective:   Physical Exam BP 146/76  Pulse 64  Temp(Src) 97.5 F (36.4 C) (Oral)  Ht 5\' 2"  (1.575 m)  Wt 175 lb 9.6 oz (79.652 kg)  BMI 32.11 kg/m2  The patient appeared well nourished and normally developed, alert and oriented to time and place. Speech, behavior and judgement appear normal. Vital signs as documented.  Head exam is unremarkable. No scleral icterus or pallor noted.  Neck is without jugular venous distension, thyromegally, or carotid bruits. Carotid upstrokes are brisk bilaterally. No cervical adenopathy. Lungs are clear anteriorly and posteriorly to auscultation. Normal respiratory effort. No axillary adenopathy Cardiac exam reveals regular rate and rhythm 72 per min. First and second heart sounds normal.  No murmurs, rubs or gallops.  Abdominal exam reveals normal bowl sounds, no masses, no organomegaly and no aortic enlargement. No inguinal adenopathy. Slight epigastric tenderness Extremities are nonedematous and both femoral and pedal pulses are normal. Skin without pallor or jaundice.  Warm and dry, without rash. Neurologic exam reveals normal deep tendon reflexes and normal sensation. Diabetic foot exam was done.          Assessment & Plan:  1. Hyperlipemia - Hepatic function panel; Standing - NMR Lipoprofile with Lipids; Standing - Hepatic function panel - NMR Lipoprofile  with Lipids  2. Essential hypertension, benign - BASIC METABOLIC PANEL WITH GFR; Standing - BASIC METABOLIC PANEL WITH  GFR  3. Fatigue - POCT CBC; Standing - BASIC METABOLIC PANEL WITH GFR; Standing - Thyroid Panel With TSH - Vitamin D 25 hydroxy; Standing - POCT CBC - BASIC METABOLIC PANEL WITH GFR - Vitamin D 25 hydroxy  4. Hypothyroid  5. Hair loss  6. Allergic rhinitis - fluticasone (FLONASE) 50 MCG/ACT nasal spray; Place 2 sprays into the nose daily.  Dispense: 16 g; Refill: 6  Patient Instructions  Look at the cost of Nasacort AQ over-the-counter 1-2 sprays each nostril daily Compare that to the prescription for Flonase, one to 2 sprays each nostril daily Continue to drink plenty of fluids, avoid environment situations that stir up your allergies Continue medications as doing Continue Synthroid 0.088 or half of a 0.175 until get the thyroid results back

## 2012-06-27 NOTE — Patient Instructions (Addendum)
Look at the cost of Nasacort AQ over-the-counter 1-2 sprays each nostril daily Compare that to the prescription for Flonase, one to 2 sprays each nostril daily Continue to drink plenty of fluids, avoid environment situations that stir up your allergies Continue medications as doing Continue Synthroid 0.088 or half of a 0.175 until get the thyroid results back

## 2012-06-28 ENCOUNTER — Encounter: Payer: Self-pay | Admitting: Gastroenterology

## 2012-06-28 ENCOUNTER — Other Ambulatory Visit: Payer: Self-pay | Admitting: Family Medicine

## 2012-06-28 ENCOUNTER — Ambulatory Visit (INDEPENDENT_AMBULATORY_CARE_PROVIDER_SITE_OTHER): Payer: BC Managed Care – PPO | Admitting: Gastroenterology

## 2012-06-28 VITALS — BP 152/90 | HR 52 | Ht 62.0 in | Wt 175.0 lb

## 2012-06-28 DIAGNOSIS — K5900 Constipation, unspecified: Secondary | ICD-10-CM

## 2012-06-28 DIAGNOSIS — K219 Gastro-esophageal reflux disease without esophagitis: Secondary | ICD-10-CM

## 2012-06-28 DIAGNOSIS — R0789 Other chest pain: Secondary | ICD-10-CM

## 2012-06-28 DIAGNOSIS — R071 Chest pain on breathing: Secondary | ICD-10-CM

## 2012-06-28 DIAGNOSIS — K5730 Diverticulosis of large intestine without perforation or abscess without bleeding: Secondary | ICD-10-CM

## 2012-06-28 LAB — VITAMIN D 25 HYDROXY (VIT D DEFICIENCY, FRACTURES): Vit D, 25-Hydroxy: 60 ng/mL (ref 30–89)

## 2012-06-28 MED ORDER — TRAMADOL HCL 50 MG PO TABS: 50.0000 mg | ORAL_TABLET | Freq: Three times a day (TID) | ORAL | Status: DC | PRN

## 2012-06-28 MED ORDER — LINACLOTIDE 145 MCG PO CAPS: 145.0000 ug | ORAL_CAPSULE | Freq: Every day | ORAL | Status: DC

## 2012-06-28 NOTE — Patient Instructions (Signed)
Please stop Amitiza and start Linzess  We have sent the following medications to your pharmacy for you to pick up at your convenience Linzess  Tramadol  Please follow up in one year.

## 2012-06-28 NOTE — Progress Notes (Signed)
This is a 67 year old Caucasian female with refractory atrial fibrillation and was treated with Cipro and metronidazole on April 15 for subacute diverticulitis.  CT scan at that time was reviewed and was felt to be consistent with mild diverticulitis.  She now complains of right flank pain which seems to be improving definitely has musculoskeletal components without any genitourinary symptoms.  She also denies cardiopulmonary problems except for recurrent palpitations, and is scheduled for repeat ablation therapy of her atrial fibrillation.  She has chronic functional constipation and denies melena or hematochezia.  She is up-to-date her endoscopy and colonoscopy.  She denies a specific food intolerances.  Her appetite is good her weight is stable.  She is on Prilosec 20 mg a day, methotrexate for rheumatoid arthritis, Paxil 10 mg a day, and Xeralto milligrams a day.  She has a history of multiple, multiple drug allergies.  Review of recent labs shows no abnormalities and urine culture was negative.    Current Medications, Allergies, Past Medical History, Past Surgical History, Family History and Social History were reviewed in [employer] record.  ROS: All systems were reviewed and are negative unless otherwise stated in the HPI.          Physical Exam: Healthy appearing patient in no distress.  Blood pressure 152/90, pulse 52, and weight 175 with a BMI of 32.  There is no CVA tenderness, and palpation of abdominal and thoracic and back shows no areas of point tenderness, or rash, organomegaly, or specific tenderness.  Mental status is normal.   Assessment and Plan: Chronic functional constipation with a history of recent probable subacute diverticulitis.  I have placed her on Linzess  145 micrograms a day as a clinical trial alert gastrointestinal constipation and diverticulosis, since she cannot tolerate fiber or fiber supplements.  We will continue her Prilosec, and I  prescribed tramadol 50 mg every 6-8 hours for musculoskeletal side pain in place of NSAIDs because of her other gastrointestinal problems.  I see no need for repeat CT scan of further workup at this time. No diagnosis found.

## 2012-07-01 ENCOUNTER — Telehealth: Payer: Self-pay | Admitting: Internal Medicine

## 2012-07-01 NOTE — Telephone Encounter (Signed)
Called patient back. States that she received a letter advising her to report to the hospital on 5/15 at 10am but she states that [place] advised her that she needed to report to the hospital at 830am. Advised I will research this and call her back.

## 2012-07-01 NOTE — Telephone Encounter (Signed)
Patient last seen on 06-27-12. Please advise. If approved please have nurse call in to pharmacy. Thank you

## 2012-07-01 NOTE — Telephone Encounter (Signed)
Spoke with Noreene Larsson in the endo unit and she moved TEE to 5/15 at 1030am for a 930am arrival. Spoke with cath lab and ablation is scheduled for 5/15 at 12 noon. Called patient back and she is aware of above. She will report to [facility] at 930am on 5/15. Advise her to take thyroid medication and acid inhibitor with water prior to arrival otherwise NPO after midnight

## 2012-07-01 NOTE — Telephone Encounter (Signed)
Okay to call into pharmacy

## 2012-07-01 NOTE — Telephone Encounter (Signed)
Called to [facility] in [place], [place] and left authorization on voicemail.

## 2012-07-01 NOTE — Telephone Encounter (Signed)
New problem    Read letter to patient    Patient is asking for clarification on what time she suppose to arrive at short stay

## 2012-07-02 LAB — NMR LIPOPROFILE WITH LIPIDS
Cholesterol, Total: 102 mg/dL (ref <200)
HDL Particle Number: 30.8 umol/L (ref >=30.5)
HDL Size: 9 nm — ABNORMAL LOW (ref >=9.2)
HDL-C: 39 mg/dL — ABNORMAL LOW (ref >=40)
LDL (calc): 34 mg/dL (ref <100)
LDL Particle Number: 671 nmol/L (ref <1000)
LDL Size: 19.8 nm — ABNORMAL LOW (ref >20.5)
LP-IR Score: 67 — ABNORMAL HIGH (ref <=45)
Large HDL-P: 3.7 umol/L — ABNORMAL LOW (ref >=4.8)
Large VLDL-P: 6.5 nmol/L — ABNORMAL HIGH (ref <=2.7)
Small LDL Particle Number: 418 nmol/L (ref <=527)
Triglycerides: 146 mg/dL (ref <150)
VLDL Size: 53.6 nm — ABNORMAL HIGH (ref <=46.6)

## 2012-07-04 ENCOUNTER — Ambulatory Visit (HOSPITAL_COMMUNITY)
Admission: RE | Admit: 2012-07-04 | Discharge: 2012-07-05 | Disposition: A | Discharge status: Home / Self Care | Payer: BC Managed Care – PPO | Source: Ambulatory Visit | Attending: Internal Medicine | Admitting: Internal Medicine

## 2012-07-04 ENCOUNTER — Encounter (HOSPITAL_COMMUNITY)
Admission: RE | Disposition: A | Discharge status: Home / Self Care | Payer: Self-pay | Source: Ambulatory Visit | Attending: Internal Medicine

## 2012-07-04 ENCOUNTER — Encounter (HOSPITAL_COMMUNITY): Payer: Self-pay | Admitting: Anesthesiology

## 2012-07-04 ENCOUNTER — Ambulatory Visit (HOSPITAL_COMMUNITY): Payer: BC Managed Care – PPO | Admitting: Anesthesiology

## 2012-07-04 DIAGNOSIS — F411 Generalized anxiety disorder: Secondary | ICD-10-CM | POA: Insufficient documentation

## 2012-07-04 DIAGNOSIS — Z950 Presence of cardiac pacemaker: Secondary | ICD-10-CM | POA: Insufficient documentation

## 2012-07-04 DIAGNOSIS — E119 Type 2 diabetes mellitus without complications: Secondary | ICD-10-CM | POA: Insufficient documentation

## 2012-07-04 DIAGNOSIS — E039 Hypothyroidism, unspecified: Secondary | ICD-10-CM | POA: Insufficient documentation

## 2012-07-04 DIAGNOSIS — I4891 Unspecified atrial fibrillation: Secondary | ICD-10-CM | POA: Diagnosis present

## 2012-07-04 DIAGNOSIS — K589 Irritable bowel syndrome without diarrhea: Secondary | ICD-10-CM | POA: Insufficient documentation

## 2012-07-04 DIAGNOSIS — I498 Other specified cardiac arrhythmias: Secondary | ICD-10-CM | POA: Diagnosis present

## 2012-07-04 DIAGNOSIS — K219 Gastro-esophageal reflux disease without esophagitis: Secondary | ICD-10-CM | POA: Insufficient documentation

## 2012-07-04 DIAGNOSIS — G609 Hereditary and idiopathic neuropathy, unspecified: Secondary | ICD-10-CM | POA: Insufficient documentation

## 2012-07-04 DIAGNOSIS — I495 Sick sinus syndrome: Secondary | ICD-10-CM | POA: Insufficient documentation

## 2012-07-04 DIAGNOSIS — I10 Essential (primary) hypertension: Secondary | ICD-10-CM | POA: Insufficient documentation

## 2012-07-04 HISTORY — PX: ATRIAL FIBRILLATION ABLATION: SHX5456

## 2012-07-04 HISTORY — PX: ATRIAL FIBRILLATION ABLATION: SHX5732

## 2012-07-04 HISTORY — PX: TEE WITHOUT CARDIOVERSION: SHX5443

## 2012-07-04 LAB — GLUCOSE, CAPILLARY
Glucose-Capillary: 134 mg/dL — ABNORMAL HIGH (ref 70–99)
Glucose-Capillary: 99 mg/dL (ref 70–99)

## 2012-07-04 LAB — POCT ACTIVATED CLOTTING TIME: Activated Clotting Time: 176 seconds

## 2012-07-04 SURGERY — ECHOCARDIOGRAM, TRANSESOPHAGEAL
Anesthesia: Moderate Sedation

## 2012-07-04 SURGERY — ATRIAL FIBRILLATION ABLATION
Anesthesia: Monitor Anesthesia Care

## 2012-07-04 MED ORDER — HYDROXYUREA 500 MG PO CAPS
ORAL_CAPSULE | ORAL | Status: AC
  Filled 2012-07-04: qty 1

## 2012-07-04 MED ORDER — ONDANSETRON HCL 4 MG/2ML IJ SOLN: 4.0000 mg | Freq: Four times a day (QID) | INTRAMUSCULAR | Status: DC | PRN

## 2012-07-04 MED ORDER — HEPARIN SODIUM (PORCINE) 1000 UNIT/ML IJ SOLN
INTRAMUSCULAR | Status: DC | PRN
  Administered 2012-07-04: 12000 [IU] via INTRAVENOUS

## 2012-07-04 MED ORDER — CARVEDILOL 6.25 MG PO TABS
18.7500 mg | ORAL_TABLET | Freq: Every day | ORAL | Status: DC
  Administered 2012-07-04: 21:00:00 18.75 mg via ORAL
  Filled 2012-07-04 (×2): qty 1

## 2012-07-04 MED ORDER — METHOTREXATE 2.5 MG PO TABS: 10.0000 mg | ORAL_TABLET | ORAL | Status: DC

## 2012-07-04 MED ORDER — TRAMADOL HCL 50 MG PO TABS: 50.0000 mg | ORAL_TABLET | Freq: Three times a day (TID) | ORAL | Status: DC | PRN

## 2012-07-04 MED ORDER — MIDAZOLAM HCL 10 MG/2ML IJ SOLN
INTRAMUSCULAR | Status: DC | PRN
  Administered 2012-07-04: 1 mg via INTRAVENOUS
  Administered 2012-07-04 (×2): 2 mg via INTRAVENOUS

## 2012-07-04 MED ORDER — PROTAMINE SULFATE 10 MG/ML IV SOLN
INTRAVENOUS | Status: DC | PRN
  Administered 2012-07-04: 40 mg via INTRAVENOUS

## 2012-07-04 MED ORDER — SODIUM CHLORIDE 0.9 % IV SOLN: INTRAVENOUS | Status: DC

## 2012-07-04 MED ORDER — LEVOTHYROXINE SODIUM 88 MCG PO TABS
88.0000 ug | ORAL_TABLET | Freq: Every day | ORAL | Status: DC
  Administered 2012-07-05: 08:00:00 88 ug via ORAL
  Filled 2012-07-04 (×2): qty 1

## 2012-07-04 MED ORDER — RIVAROXABAN 20 MG PO TABS
20.0000 mg | ORAL_TABLET | Freq: Every day | ORAL | Status: DC
  Administered 2012-07-04: 20 mg via ORAL
  Filled 2012-07-04 (×2): qty 1

## 2012-07-04 MED ORDER — FENTANYL CITRATE 0.05 MG/ML IJ SOLN
12.5000 ug | Freq: Once | INTRAMUSCULAR | Status: AC
  Administered 2012-07-04: 12.5 ug via INTRAVENOUS

## 2012-07-04 MED ORDER — CARVEDILOL 12.5 MG PO TABS: 12.5000 mg | ORAL_TABLET | Freq: Two times a day (BID) | ORAL | Status: DC

## 2012-07-04 MED ORDER — PROPOFOL INFUSION 10 MG/ML OPTIME
INTRAVENOUS | Status: DC | PRN
  Administered 2012-07-04: 50 ug/kg/min via INTRAVENOUS

## 2012-07-04 MED ORDER — CARVEDILOL 12.5 MG PO TABS
12.5000 mg | ORAL_TABLET | Freq: Every day | ORAL | Status: DC
  Administered 2012-07-05: 12.5 mg via ORAL
  Filled 2012-07-04 (×2): qty 1

## 2012-07-04 MED ORDER — LORAZEPAM 0.5 MG PO TABS
0.5000 mg | ORAL_TABLET | Freq: Four times a day (QID) | ORAL | Status: DC | PRN
  Administered 2012-07-05: 05:00:00 0.5 mg via ORAL
  Filled 2012-07-04: qty 1

## 2012-07-04 MED ORDER — PAROXETINE HCL 10 MG PO TABS
10.0000 mg | ORAL_TABLET | Freq: Every day | ORAL | Status: DC
  Administered 2012-07-04: 21:00:00 10 mg via ORAL
  Filled 2012-07-04 (×2): qty 1

## 2012-07-04 MED ORDER — ISOPROTERENOL HCL 0.2 MG/ML IJ SOLN
1000.0000 ug | INTRAVENOUS | Status: DC | PRN
  Administered 2012-07-04: 20 ug/min via INTRAVENOUS

## 2012-07-04 MED ORDER — BUTAMBEN-TETRACAINE-BENZOCAINE 2-2-14 % EX AERO
INHALATION_SPRAY | CUTANEOUS | Status: DC | PRN
  Administered 2012-07-04: 2 via TOPICAL

## 2012-07-04 MED ORDER — FENTANYL CITRATE 0.05 MG/ML IJ SOLN
INTRAMUSCULAR | Status: AC
  Filled 2012-07-04: qty 2

## 2012-07-04 MED ORDER — MIDAZOLAM HCL 5 MG/5ML IJ SOLN
INTRAMUSCULAR | Status: DC | PRN
  Administered 2012-07-04: 2 mg via INTRAVENOUS

## 2012-07-04 MED ORDER — PANTOPRAZOLE SODIUM 40 MG PO TBEC
40.0000 mg | DELAYED_RELEASE_TABLET | Freq: Every day | ORAL | Status: DC
  Administered 2012-07-05: 09:00:00 40 mg via ORAL
  Filled 2012-07-04: qty 1

## 2012-07-04 MED ORDER — ACETAMINOPHEN 325 MG PO TABS
650.0000 mg | ORAL_TABLET | ORAL | Status: DC | PRN
  Administered 2012-07-05: 650 mg via ORAL
  Filled 2012-07-04: qty 2

## 2012-07-04 MED ORDER — LACTATED RINGERS IV SOLN
INTRAVENOUS | Status: DC | PRN
  Administered 2012-07-04: 12:00:00 via INTRAVENOUS

## 2012-07-04 MED ORDER — SODIUM CHLORIDE 0.9 % IV SOLN: 250.0000 mL | INTRAVENOUS | Status: DC | PRN

## 2012-07-04 MED ORDER — FLECAINIDE ACETATE 100 MG PO TABS
100.0000 mg | ORAL_TABLET | Freq: Two times a day (BID) | ORAL | Status: DC | PRN
  Filled 2012-07-04: qty 1

## 2012-07-04 MED ORDER — TORSEMIDE 20 MG PO TABS
20.0000 mg | ORAL_TABLET | Freq: Every day | ORAL | Status: DC
  Filled 2012-07-04 (×2): qty 1

## 2012-07-04 MED ORDER — BUPIVACAINE HCL (PF) 0.25 % IJ SOLN
INTRAMUSCULAR | Status: AC
  Filled 2012-07-04: qty 30

## 2012-07-04 MED ORDER — OFF THE BEAT BOOK
Freq: Once | Status: AC
  Administered 2012-07-05: 04:00:00
  Filled 2012-07-04: qty 1

## 2012-07-04 MED ORDER — FENTANYL CITRATE 0.05 MG/ML IJ SOLN
12.5000 ug | Freq: Once | INTRAMUSCULAR | Status: AC
  Administered 2012-07-04: 16:00:00 via INTRAVENOUS

## 2012-07-04 MED ORDER — FENTANYL CITRATE 0.05 MG/ML IJ SOLN
INTRAMUSCULAR | Status: DC | PRN
  Administered 2012-07-04 (×3): 25 ug via INTRAVENOUS

## 2012-07-04 MED ORDER — MIDAZOLAM HCL 5 MG/ML IJ SOLN
INTRAMUSCULAR | Status: AC
  Filled 2012-07-04: qty 3

## 2012-07-04 MED ORDER — ONDANSETRON HCL 4 MG/2ML IJ SOLN
INTRAMUSCULAR | Status: DC | PRN
  Administered 2012-07-04: 4 mg via INTRAVENOUS

## 2012-07-04 MED ORDER — SODIUM CHLORIDE 0.9 % IJ SOLN: 3.0000 mL | INTRAMUSCULAR | Status: DC | PRN

## 2012-07-04 MED ORDER — SODIUM CHLORIDE 0.9 % IJ SOLN
3.0000 mL | Freq: Two times a day (BID) | INTRAMUSCULAR | Status: DC
  Administered 2012-07-04 – 2012-07-05 (×2): 3 mL via INTRAVENOUS

## 2012-07-04 MED ORDER — HEPARIN SODIUM (PORCINE) 1000 UNIT/ML IJ SOLN
INTRAMUSCULAR | Status: AC
  Filled 2012-07-04: qty 1

## 2012-07-04 MED ORDER — FENTANYL CITRATE 0.05 MG/ML IJ SOLN
INTRAMUSCULAR | Status: DC | PRN
  Administered 2012-07-04 (×2): 25 ug via INTRAVENOUS
  Administered 2012-07-04: 50 ug via INTRAVENOUS
  Administered 2012-07-04: 25 ug via INTRAVENOUS

## 2012-07-04 NOTE — Progress Notes (Signed)
  Echocardiogram Echocardiogram Transesophageal has been performed.  [name] 07/04/2012, 12:26 PM

## 2012-07-04 NOTE — Anesthesia Postprocedure Evaluation (Signed)
  Anesthesia Post-op Note  Patient: [name]  Procedure(s) Performed: Procedure(s): ATRIAL FIBRILLATION ABLATION (N/A)  Patient Location: PACU and Cath Lab  Anesthesia Type:MAC  Level of Consciousness: awake  Airway and Oxygen Therapy: Patient Spontanous Breathing  Post-op Pain: mild  Post-op Assessment: Post-op Vital signs reviewed  Post-op Vital Signs: Reviewed  Complications: No apparent anesthesia complications

## 2012-07-04 NOTE — CV Procedure (Signed)
See full TEE report in [facility]; normal LV function; mild LAE, no LAA thrombus, mild MR and TR. Olga Millers

## 2012-07-04 NOTE — Preoperative (Signed)
Beta Blockers   Reason not to administer Beta Blockers:Not Applicable 

## 2012-07-04 NOTE — Anesthesia Preprocedure Evaluation (Addendum)
Anesthesia Evaluation  Patient identified by MRN, date of birth, ID band Patient awake    Reviewed: Allergy & Precautions, H&P , NPO status , Patient's Chart, lab work & pertinent test results  History of Anesthesia Complications (+) PONV  Airway Mallampati: II      Dental   Pulmonary shortness of breath and with exertion,  breath sounds clear to auscultation        Cardiovascular hypertension, + dysrhythmias Atrial Fibrillation + pacemaker Rhythm:Regular Rate:Normal     Neuro/Psych    GI/Hepatic hiatal hernia, GERD-  ,  Endo/Other  diabetesHypothyroidism   Renal/GU      Musculoskeletal   Abdominal   Peds  Hematology   Anesthesia Other Findings   Reproductive/Obstetrics                           Anesthesia Physical Anesthesia Plan  ASA: III  Anesthesia Plan: MAC   Post-op Pain Management:    Induction: Intravenous  Airway Management Planned: Simple Face Mask  Additional Equipment:   Intra-op Plan:   Post-operative Plan:   Informed Consent: I have reviewed the patients History and Physical, chart, labs and discussed the procedure including the risks, benefits and alternatives for the proposed anesthesia with the patient or authorized representative who has indicated his/her understanding and acceptance.   Dental advisory given  Plan Discussed with: CRNA and Anesthesiologist  Anesthesia Plan Comments:        Anesthesia Quick Evaluation

## 2012-07-04 NOTE — Discharge Summary (Signed)
ELECTROPHYSIOLOGY DISCHARGE SUMMARY    Patient ID: [name],  MRN: [id number], DOB/AGE: [birth year] 67 y.o.  Admit date: 07/04/2012 Discharge date: 07/05/2012  Primary Care Physician: Rudi Heap, MD Primary Cardiologist: Lewayne Bunting, MD / Hillis Range, MD  Primary Discharge Diagnosis:  1. Symptomatic AF s/p EPS +RF ablation  Secondary Discharge Diagnoses:  1. Tachy-brady syndrome s/p PPM implantation 2010 2. HTN 3. Hypothyroidism 4. Anxiety 5. Peripheral neuropathy 6. GERD 7. History of esophageal stricture 8. IBS  Procedures This Admission: 1. TEE -- See full TEE report in [facility] - normal LV function, mild LAE, no LAA thrombus, mild MR and TR 2. EPS +RF ablation of atrial fibrillation -- Comprehensive electrophysiologic study  -- Attempted coronary sinus pacing and recording  -- Three-dimensional mapping of atrial fibrillation --  Ablation of atrial fibrillation  -- Intracardiac echocardiography  -- Transseptal puncture of an intact septum  -- Rotational Angiography with processing at an independent workstation  -- Arrhythmia induction with pacing with isuprel infusion  -- Dual chamber pacemaker interrogation and reprogramming  CONCLUSIONS:  1. Atrial fibrillation upon presentation.  2. Rotational Angiography reveals a moderate sized left atrium with four separate pulmonary veins without evidence of pulmonary vein stenosis.  3. Return of electrical activity within the left superior and right inferior pumonary veins. The left inferior pulmonary vein was quiescent from the prior ablation procedure. There was minimal conduction within the right superior pulmonary vein. The patient underwent successful sequential electrical reisolation and anatomical encircling of the the left superior, right superior,and inferior pulmonary veins.  4. No inducible arrhythmias following ablation both on and off of Isuprel.  5. No early apparent complications.  History and Hospital  Course:  [name] is a 67 year old woman with paroxysmal atrial fibrillation who now presented yesterday for EP study and radiofrequency ablation. [name] previously failed medical therapy and underwent atrial fibrillation ablation by Dr. Johney Frame in Dec 2013. She did well initially following ablation; however, she reports increasing frequency and duration of atrial fibrillation over the last several weeks. [name] elected to proceed with repeat EPS +RF ablation. She also underwent pre-procedure TEE which ruled out LA/LAA thrombus. Please see details as outlined above. [name] tolerated this procedure well without any immediate complication. She remains hemodynamically stable and afebrile. Telemetry shows SR. Her groin site is intact without significant bleeding or hematoma. She has been given discharge instructions including wound care and activity restrictions. She will follow-up in clinic in 12 weeks. She has been seen, examined and deemed stable for discharge today by Dr. Hillis Range.  Physical Exam: Vitals: Blood pressure 123/60, pulse 67, temperature 98.3 F (36.8 C), temperature source Oral, resp. rate 18, height 5\' 2"  (1.575 m), weight 180 lb 8.9 oz (81.9 kg), SpO2 98.00%.  General: WD, WN 67 year old female in no acute distress. Neck: Supple. No JVD. Heart: RRR. No murmur, rub, S3 or S4. Lungs: CTA bilaterally. No wheezes, rales or rhonchi. Abdomen: Soft, nondistended. Extremities: No cyanosis, clubbing or edema. Groin site intact without significant bleeding or hematoma. Neuro: Alert and oriented x 3. No focal deficits.   Labs: Lab Results  Component Value Date   WBC 7.6 06/27/2012   HGB 12.7 06/27/2012   HCT 38.2 06/27/2012   MCV 89.9 06/27/2012   PLT 347.0 06/05/2012       Component Value Date/Time   NA 141 07/05/2012 0330   K 3.9 07/05/2012 0330   CL 104 07/05/2012 0330   CO2 30  07/05/2012 0330   GLUCOSE 132* 07/05/2012 0330   BUN 11 07/05/2012 0330   CREATININE 0.67 07/05/2012  0330   CREATININE 0.63 06/27/2012 1003   CALCIUM 9.0 07/05/2012 0330   GFRNONAA >90 07/05/2012 0330   GFRAA >90 07/05/2012 0330    Disposition:  The patient is being discharged in stable condition.  Follow-up:     Follow-up Information   Follow up with Hillis Range, MD On 10/14/2012. (At 11:30 AM)    Contact information:   [address] [place] [address] [phone]     Discharge Medications:    Medication List    TAKE these medications       ARTIFICIAL TEAR OP  Place 1 drop into both eyes 2 (two) times daily as needed. For dry eyes     carvedilol 12.5 MG tablet  Commonly known as:  COREG  Take 12.5-18.75 mg by mouth 2 (two) times daily with a meal. 1 tablet (12.5 mg) every morning and 1 1/2 tablets (18.75 mg) every night     CITRACAL PETITES/VITAMIN D PO  Take 2 tablets by mouth daily.     clobetasol cream 0.05 %  Commonly known as:  TEMOVATE  Apply 1 application topically daily as needed (for psoriasis).     flecainide 100 MG tablet  Commonly known as:  TAMBOCOR  Take 100 mg by mouth 2 (two) times daily as needed (for atrial fibrillation).     fluticasone 50 MCG/ACT nasal spray  Commonly known as:  FLONASE  Place 2 sprays into the nose daily.     ibuprofen 200 MG tablet  Commonly known as:  ADVIL,MOTRIN  Take 200 mg by mouth 2 (two) times daily as needed for pain.     levothyroxine 88 MCG tablet  Commonly known as:  SYNTHROID  Take 1 tablet (88 mcg total) by mouth daily before breakfast.     Linaclotide 145 MCG Caps  Commonly known as:  LINZESS  Take 1 capsule (145 mcg total) by mouth daily.     LORazepam 0.5 MG tablet  Commonly known as:  ATIVAN  TAKE 1 TABLET BY MOUTH 3 TIMES A DAY     LUTEIN PO  Take 1 capsule by mouth 2 (two) times daily.     magnesium oxide 400 MG tablet  Commonly known as:  MAG-OX  Take 400 mg by mouth 2 (two) times a week. Random days     metFORMIN 500 MG 24 hr tablet  Commonly known as:  GLUCOPHAGE-XR    Take 500 mg by mouth daily with breakfast.     methotrexate 2.5 MG tablet  Commonly known as:  RHEUMATREX  Take 10 mg by mouth once a week. Caution:Chemotherapy. Protect from light.     omeprazole 40 MG capsule  Commonly known as:  PRILOSEC  Take 40 mg by mouth 2 (two) times daily.     ONE TOUCH ULTRA TEST test strip  Generic drug:  glucose blood     PARoxetine 10 MG tablet  Commonly known as:  PAXIL  Take 10 mg by mouth every evening.     PHOSPHATIDYLSERINE PO  Take 1 tablet by mouth daily.     rosuvastatin 10 MG tablet  Commonly known as:  CRESTOR  Take 10 mg by mouth every evening.     torsemide 20 MG tablet  Commonly known as:  DEMADEX  Take 20 mg by mouth daily.     traMADol 50 MG tablet  Commonly known as:  ULTRAM  Take 1 tablet (50 mg total) by mouth every 8 (eight) hours as needed for pain.     XARELTO 20 MG Tabs  Generic drug:  Rivaroxaban  Take 20 mg by mouth daily with supper.       Duration of Discharge Encounter: Greater than 30 minutes including physician time.  Signed, Rick Duff, PA-C 07/05/2012, 7:38 AM  I have seen, examined the patient, and reviewed the above assessment and plan.  Changes to above are made where necessary.  Doing well s/p ablation Resume home medicines Follow-up with me in 12 weeks  Co Sign: Hillis Range, MD 07/05/2012 8:39 AM

## 2012-07-04 NOTE — Transfer of Care (Signed)
Immediate Anesthesia Transfer of Care Note  Patient: [name]  Procedure(s) Performed: Procedure(s): ATRIAL FIBRILLATION ABLATION (N/A)  Patient Location: PACU  Anesthesia Type:MAC  Level of Consciousness: awake, alert  and oriented  Airway & Oxygen Therapy: Patient Spontanous Breathing and Patient connected to nasal cannula oxygen  Post-op Assessment: Report given to PACU RN  Post vital signs: Reviewed and stable  Complications: No apparent anesthesia complications

## 2012-07-04 NOTE — Op Note (Signed)
SURGEON:  Hillis Range, MD  PREPROCEDURE DIAGNOSES: 1. Paroxysmal atrial fibrillation.  POSTPROCEDURE DIAGNOSES: 1. Paroxysmal  atrial fibrillation.  PROCEDURES: 1. Comprehensive electrophysiologic study. 2. Attempted coronary sinus pacing and recording. 3. Three-dimensional mapping of atrial fibrillation 4. Ablation of atrial fibrillation 5. Intracardiac echocardiography. 6. Transseptal puncture of an intact septum. 7. Rotational Angiography with processing at an independent workstation 8. Arrhythmia induction with pacing with isuprel infusion 9. Dual chamber pacemaker interrogation and reprogramming  INTRODUCTION:  [name] is a 67 y.o. female with a history of paroxysmal atrial fibrillation who now presents for EP study and radiofrequency ablation.  The patient previously failed medical therapy and underwent atrial fibrillation ablation by me 12/13. She did well initially following ablation.  The patient reports increasing frequency and duration of atrial fibrillation since that time.   The patient therefore presents today for repeat catheter ablation of atrial fibrillation.  DESCRIPTION OF PROCEDURE:  Informed written consent was obtained, and the patient was brought to the electrophysiology lab in a fasting state.  The patient was adequately sedated with intravenous medications as outlined in the anesthesia report.  The patient's left and right groins were prepped and draped in the usual sterile fashion by the EP lab staff.  Using a percutaneous Seldinger technique, two 7-French and one 11-French hemostasis sheaths were placed into the right common femoral vein.    3 Dimensional Rotational Angiography: A 5 french pigtail catheter was introduced through the right common femoral vein and advanced into the inferior venocava.  3 demential rotational angiography was then performed by power injection of 100cc of nonionic contrast.  Reprocessing at an independent work station was then  performed.   This demonstrated a moderate sized left atrium with 4 separate pulmonary veins which were also moderate in size.  There were no anomalous veins or significant abnormalities.  A 3 dimensional rendering of the left atrium was then merged using [employer] onto the Well[employer] system and registered with intracardiac echo (see below).  The pigtail catheter was then removed.  Catheter Placement:  A 7-French Biosense Webster Decapolar coronary sinus catheter was introduced through the right common femoral vein and advanced into the coronary sinus for recording and pacing from this location.  Unfortunately due to the very posterior coronary sinus ostium, the catheter was not stable within the CS.  It was therefore exchanged for a 6French Polaris coronary sinus catheter which could also not be adequately placed into the CS.  The catheter was therefore positioned along there lateral wall of the right atrium for recording and pacing.  A 6-French quadripolar Josephson catheter was introduced through the right common femoral vein and advanced into the right ventricle for recording and pacing.  This catheter was then pulled back to the His bundle location.    Initial Measurements: The patient presented to the electrophysiology lab in atrial fibrillation.    Intracardiac Echocardiography: A 10-French Biosense Webster AcuNav intracardiac echocardiography catheter was introduced through the left common femoral vein and advanced into the right atrium. Intracardiac echocardiography was performed of the left atrium, and a three-dimensional anatomical rendering of the left atrium was performed using CARTO sound technology.  The patient was noted to have a moderate sized left atrium.  The interatrial septum was prominent but not aneurysmal. All 4 pulmonary veins were visualized and noted to have separate ostia.  The pulmonary veins were moderate in size.  The left atrial appendage was visualized and  did not reveal thrombus.   There  was no evidence of pulmonary vein stenosis.   Transseptal Puncture: The middle right common femoral vein sheath was exchanged for an 8.5 [place] SL2 transseptal sheath and transseptal access was achieved in a standard fashion using a Brockenbrough needle under biplane fluoroscopy with intracardiac echocardiography confirmation of the transseptal puncture.  Once transseptal access had been achieved, heparin was administered intravenously and intra- arterially in order to maintain an ACT of greater than 350 seconds throughout the procedure.   3D Mapping and Ablation: The His bundle catheter was removed and in its place a 3.5 mm [employer] Thermocool ablation catheter was advanced into the right atrium.  The transseptal sheath was pulled back into the IVC over a guidewire.  The ablation catheter was advanced across the transseptal hole using the wire as a guide.  The transseptal sheath was then re-advanced over the guidewire into the left atrium.  A duodecapolar Biosense Webster circular mapping catheter was introduced through the transseptal sheath and positioned over the mouth of all 4 pulmonary veins.  Three-dimensional electroanatomical mapping was performed using CARTO technology.  This demonstrated electrical activity within the left superior and right inferior pumonary veins.  The left inferior pulmonary vein was quiescent from the prior ablation procedure.  There was minimal conduction within the right superior pulmonary vein.  The patient underwent successful sequential electrical reisolation and anatomical encircling of the the left superior, right superior,and inferior pulmonary veins using radiofrequency current with a circular mapping catheter as a guide. The patient converted to sinus rhythm during ablation of the left superior pulmonary vein.  Measurements Following Ablation: Following ablation, Isuprel was infused up to 20 mcg/min with no  inducible atrial fibrillation, atrial tachycardia, atrial flutter, or sustained PACs. In sinus rhythm with RR interval was 761 msec, with PR , QRS 87 msec, and Qtc 425 msec.  Following ablation the AH interval measured with an HV interval of 45 msec. Ventricular pacing was performed, which revealed VA dissociation when pacing at 600 msec.  Rapid atrial pacing was performed with no arrhythmias observed.  Electroisolation was then again confirmed in all four pulmonary veins.  The procedure was therefore considered completed.  All catheters were removed, and the sheaths were aspirated and flushed.  The patient was transferred to the recovery area for sheath removal per protocol.  A limited bedside transthoracic echocardiogram revealed no pericardial effusion. Her SJM Accent DR RF pacemaker was interrogated before and after the procedure and atrial and ventricular sensing, threshold, and impedance values were unchanged. There were no early apparent complications.  CONCLUSIONS: 1. Atrial fibrillation upon presentation.   2. Rotational Angiography reveals a moderate sized left atrium with four separate pulmonary veins without evidence of pulmonary vein stenosis. 3. Return of electrical activity within the left superior and right inferior pumonary veins.  The left inferior pulmonary vein was quiescent from the prior ablation procedure.  There was minimal conduction within the right superior pulmonary vein.  The patient underwent successful sequential electrical reisolation and anatomical encircling of the the left superior, right superior,and inferior pulmonary veins 4. No inducible arrhythmias following ablation both on and off of Isuprel 5. No early apparent complications.   Fayrene Fearing Teonia Yager,MD 3:42 PM 07/04/2012

## 2012-07-04 NOTE — Interval H&P Note (Signed)
History and Physical Interval Note:  07/04/2012 10:27 AM  [name]  has presented today for surgery, with the diagnosis of a-fib  The various methods of treatment have been discussed with the patient and family. After consideration of risks, benefits and other options for treatment, the patient has consented to  Procedure(s): TRANSESOPHAGEAL ECHOCARDIOGRAM (TEE) (N/A) as a surgical intervention .  The patient's history has been reviewed, patient examined, no change in status, stable for surgery.  I have reviewed the patient's chart and labs.  Questions were answered to the patient's satisfaction.     Olga Millers

## 2012-07-04 NOTE — Interval H&P Note (Signed)
History and Physical Interval Note:  07/04/2012 12:11 PM  [name]  has presented today for surgery, with the diagnosis of AFib  The various methods of treatment have been discussed with the patient and family. After consideration of risks, benefits and other options for treatment, the patient has consented to  Procedure(s): ATRIAL FIBRILLATION ABLATION (N/A) as a surgical intervention .  The patient's history has been reviewed, patient examined, no change in status, stable for surgery.  I have reviewed the patient's chart and labs.  Questions were answered to the patient's satisfaction.     Hillis Range

## 2012-07-04 NOTE — H&P (View-Only) (Signed)
HPI   [name]  returns today for additional evaluation. She is a very pleasant 67 year old woman with a history of paroxysmal atrial fibrillation, symptomatic tachycardia bradycardia syndrome, status post permanent pacemaker insertion. She was treated with multiple antiarrhythmic drugs and has over the years had worsening atrial fibrillation.  She underwent afib ablation by me 12/13.  Unfortunatley, she continues to have afib.  She reports symptoms of palpitations and fatigue.  She denies CP, SOB, dizziness, presyncope, or syncope.   Allergies  Allergen Reactions  . Caudal Tray (Lidocaine-Epinephrine)     Edema    . Ciprofloxacin Hcl Nausea Only  . Codeine     REACTION: nausea  . Diovan (Valsartan) Itching  . Doxycycline     Per pt: unknown  . Esomeprazole Magnesium     Headache    . Levofloxacin     Insomnia    . Neomycin-Bacitracin Zn-Polymyx     rash  . Penicillins     hives  . Sulfa Drugs Cross Reactors   . Verapamil     Edema   . Vimovo (Naproxen-Esomeprazole)     Upset stomach   . Zocor (Simvastatin)     Edema, and myalagias      Current Outpatient Prescriptions  Medication Sig Dispense Refill  . AMITIZA 8 MCG capsule Take 8 mcg by mouth as needed. As needed for constipation.      . ARTIFICIAL TEAR OP Place 1 drop into both eyes 2 (two) times daily as needed. For dry eyes      . Calcium Citrate-Vitamin D (CITRACAL PETITES/VITAMIN D PO) Take 2 tablets by mouth daily.      . carvedilol (COREG) 12.5 MG tablet TAKE 1 TABLET BY MOUTH IN THE MORNING , AND TAKE 1 & 1/2 TABLETS IN THE EVENING  75 tablet  5  . ciprofloxacin (CIPRO) 500 MG tablet Take 1 tablet (500 mg total) by mouth 2 (two) times daily.  20 tablet  0  . clobetasol cream (TEMOVATE) 0.05 % Apply 1 application topically daily as needed. As needed for Psoriasis.      . flecainide (TAMBOCOR) 100 MG tablet Take 100 mg by mouth 2 (two) times daily as needed.       Marland Kitchen levothyroxine (SYNTHROID) 88 MCG tablet Take  1 tablet (88 mcg total) by mouth daily before breakfast.  42 tablet  0  . LORazepam (ATIVAN) 0.5 MG tablet Take 1 mg by mouth at bedtime as needed. For sleep      . magnesium oxide (MAG-OX 400) 400 MG tablet Take 1 tablet (400 mg total) by mouth daily.  30 tablet  11  . metFORMIN (GLUCOPHAGE-XR) 500 MG 24 hr tablet Take 500 mg by mouth daily with breakfast.       . methotrexate (RHEUMATREX) 2.5 MG tablet Take 10 mg by mouth once a week. Caution:Chemotherapy. Protect from light.      . metroNIDAZOLE (FLAGYL) 250 MG tablet Take 1 tablet (250 mg total) by mouth 3 (three) times daily.  30 tablet  0  . multivitamin-lutein (OCUVITE-LUTEIN) CAPS Take 1 capsule by mouth 2 (two) times daily.      Marland Kitchen omeprazole (PRILOSEC) 40 MG capsule TAKE ONE CAPSULE BY MOUTH TWICE A DAY  60 capsule  3  . ONE TOUCH ULTRA TEST test strip       . PARoxetine (PAXIL) 10 MG tablet Take 10 mg by mouth every evening.      . promethazine (PHENERGAN) 12.5 MG tablet Take 1 tablet (12.5  mg total) by mouth every 6 (six) hours as needed for nausea.  30 tablet  0  . Rivaroxaban (XARELTO) 20 MG TABS Take 1 tablet (20 mg total) by mouth daily.  30 tablet  11  . rosuvastatin (CRESTOR) 10 MG tablet Take 10 mg by mouth daily.        Marland Kitchen torsemide (DEMADEX) 20 MG tablet Take 20 mg by mouth daily.       No current facility-administered medications for this visit.     Past Medical History  Diagnosis Date  . Impaired glucose tolerance   . Psoriasis   . IBS (irritable bowel syndrome)     vs diarrhea vs abd. fullness   . Chronic anxiety   . Uterine prolapse   . Bronchial spasms   . Hypertension   . Esophageal stricture   . Elevated blood sugar   . Diverticulosis   . Rectocele, female   . History of bladder repair surgery   . Hx of vaginal hysterectomy   . Vaginal prolapse 1998  . Hypothyroidism   . Psoriasis   . Tachy-brady syndrome     a. 08/19/2008 s/p PPM: SJM 2110 Accent  . Complication of anesthesia     " I shake real bad "   . Family history of anesthesia complication     Daughter also shakes while waking up  . GERD (gastroesophageal reflux disease)   . H/O hiatal hernia   . Neuromuscular disorder     periferal neuropathy  . Anemia   . Paroxysmal atrial fibrillation     a. failed flecainide, tikosyn, amio;  b. 01/2012 s/p RFCA.  . H/O scarlet fever   . Pacemaker     St. Jude    Past Surgical History  Procedure Laterality Date  . Vaginal hysterectomy      prolapse   . Bladder suspension    . Rectocele repair    . Transthoracic echocardiogram  2008  . Pacemaker insertion      SJM  . Tee without cardioversion  02/06/2012    Procedure: TRANSESOPHAGEAL ECHOCARDIOGRAM (TEE);  Surgeon: Vesta Mixer, MD;  Location: [facility] ENDOSCOPY;  Service: Cardiovascular;  Laterality: N/A;  . Atrial fibrillation ablation  02/05/13    PVI by Dr Johney Frame     Family History  Problem Relation Age of Onset  . Breast cancer Sister   . Ovarian cancer Sister   . Uterine cancer Paternal Aunt   . Crohn's disease Other     neice  . Diabetes Mother   . Heart disease Mother   . Diabetes Maternal Grandmother   . Diabetes Sister   . Heart disease Father   . Heart disease Brother   . Heart disease Brother   . Colon cancer Neg Hx   . Stomach cancer Neg Hx      History   Social History  . Marital Status: Married    Spouse Name: N/A    Number of Children: 2  . Years of Education: N/A   Occupational History  . ADMINISTRATION    Social History Main Topics  . Smoking status: Never Smoker   . Smokeless tobacco: Never Used  . Alcohol Use: No  . Drug Use: No  . Sexually Active: Yes   Other Topics Concern  . Not on file   Social History Narrative   Lives in [place].  Works as a Financial controller Exam: Filed Vitals:   06/19/12 1535  BP: 156/83  Pulse: 62  Height: 5\' 3"  (1.6 m)  Weight: 175 lb (79.379 kg)    GEN- The patient is well appearing, alert and oriented x 3 today.   Head-  normocephalic, atraumatic Eyes-  Sclera clear, conjunctiva pink Ears- hearing intact Oropharynx- clear Neck- supple, no JVP Lymph- no cervical lymphadenopathy Lungs- Clear to ausculation bilaterally, normal work of breathing Heart- Regular rate and rhythm, no murmurs, rubs or gallops, PMI not laterally displaced GI- soft, NT, ND, + BS Extremities- no clubbing, cyanosis, or edema Neuro- strength and sensation are intact  DEVICE  Normal device function.  See PaceArt for details.   Assessment/Plan:

## 2012-07-05 ENCOUNTER — Encounter (HOSPITAL_COMMUNITY): Payer: Self-pay | Admitting: Cardiology

## 2012-07-05 DIAGNOSIS — I4891 Unspecified atrial fibrillation: Secondary | ICD-10-CM

## 2012-07-05 LAB — BASIC METABOLIC PANEL
BUN: 11 mg/dL (ref 6–23)
CO2: 30 mEq/L (ref 19–32)
Calcium: 9 mg/dL (ref 8.4–10.5)
Chloride: 104 mEq/L (ref 96–112)
Creatinine, Ser: 0.67 mg/dL (ref 0.50–1.10)
GFR calc Af Amer: >90 mL/min (ref >90)
GFR calc non Af Amer: >90 mL/min (ref >90)
Glucose, Bld: 132 mg/dL — ABNORMAL HIGH (ref 70–99)
Potassium: 3.9 mEq/L (ref 3.5–5.1)
Sodium: 141 mEq/L (ref 135–145)

## 2012-07-08 ENCOUNTER — Telehealth: Payer: Self-pay | Admitting: Internal Medicine

## 2012-07-08 ENCOUNTER — Encounter: Payer: Self-pay | Admitting: Internal Medicine

## 2012-07-08 LAB — POCT ACTIVATED CLOTTING TIME
Activated Clotting Time: 323 seconds
Activated Clotting Time: 377 seconds

## 2012-07-08 NOTE — Telephone Encounter (Signed)
New problem   Pt had ablation on 07/04/12 and went into Afib over weekend for at least 24hrs. Pt isn't in Afib at the present. Pt would like to speak to nurse concerning this in case she goes into it again. Please call pt.

## 2012-07-08 NOTE — Telephone Encounter (Signed)
Called and left message for patient that it is normal to have episodes of fib during the 3 months post PVI while healing is taking place.  I have asked her to call me if it last longer than 48 hours

## 2012-07-11 ENCOUNTER — Encounter: Payer: Self-pay | Admitting: Internal Medicine

## 2012-07-14 ENCOUNTER — Other Ambulatory Visit: Payer: Self-pay | Admitting: Family Medicine

## 2012-07-18 ENCOUNTER — Other Ambulatory Visit: Payer: Self-pay | Admitting: *Deleted

## 2012-07-18 ENCOUNTER — Encounter: Payer: Self-pay | Admitting: Family Medicine

## 2012-07-18 MED ORDER — METFORMIN HCL ER 500 MG PO TB24: 500.0000 mg | ORAL_TABLET | Freq: Every day | ORAL | Status: DC

## 2012-07-22 ENCOUNTER — Other Ambulatory Visit: Payer: Self-pay | Admitting: Internal Medicine

## 2012-08-09 ENCOUNTER — Other Ambulatory Visit: Payer: Self-pay | Admitting: Gastroenterology

## 2012-08-20 ENCOUNTER — Telehealth: Payer: Self-pay | Admitting: Internal Medicine

## 2012-08-20 NOTE — Telephone Encounter (Signed)
Patient called to report that she is back in a-fib with an irregular rate.  Current rate is reported to be 88bpm.  States it is worse than before her Ablation on 07/04/2012.  Will forward to Dennis Bast, RN.

## 2012-08-20 NOTE — Telephone Encounter (Signed)
New problem  Pt states she is having afib and has had it since yesterday. She said it seems worse then before she had the ablation.

## 2012-09-05 DIAGNOSIS — C44519 Basal cell carcinoma of skin of other part of trunk: Secondary | ICD-10-CM | POA: Diagnosis not present

## 2012-09-10 ENCOUNTER — Telehealth: Payer: Self-pay | Admitting: Internal Medicine

## 2012-09-10 NOTE — Telephone Encounter (Signed)
New message:  Pt has new insurance which will require pre auth for Xarelto.  She has [facility] policy number [id number], issuer number [id number]  Phone number to call [phone] to pre authorize.  Please call patient if any additional info is needed.  Pt has 2-3 wks left.

## 2012-09-15 ENCOUNTER — Encounter: Payer: Self-pay | Admitting: Internal Medicine

## 2012-09-17 ENCOUNTER — Telehealth: Payer: Self-pay | Admitting: Family Medicine

## 2012-09-17 NOTE — Telephone Encounter (Signed)
PA done [id number] approved for 12 months

## 2012-09-18 ENCOUNTER — Other Ambulatory Visit: Payer: Self-pay | Admitting: *Deleted

## 2012-09-18 DIAGNOSIS — E039 Hypothyroidism, unspecified: Secondary | ICD-10-CM

## 2012-09-18 MED ORDER — LEVOTHYROXINE SODIUM 88 MCG PO TABS: 88.0000 ug | ORAL_TABLET | Freq: Every day | ORAL | Status: DC

## 2012-09-18 NOTE — Telephone Encounter (Signed)
Pt's RX sent to [facility]

## 2012-09-19 ENCOUNTER — Telehealth: Payer: Self-pay | Admitting: *Deleted

## 2012-09-19 NOTE — Telephone Encounter (Signed)
E-mailed to patient---After talking with Dr Johney Frame he has recommended that you start the Flecainide back at 100mg  twice daily and keep your appointment as scheduled on 10/14/12.  Please call if there is a problem

## 2012-09-25 ENCOUNTER — Other Ambulatory Visit: Payer: Self-pay

## 2012-10-14 ENCOUNTER — Encounter: Payer: Self-pay | Admitting: Internal Medicine

## 2012-10-14 ENCOUNTER — Ambulatory Visit (INDEPENDENT_AMBULATORY_CARE_PROVIDER_SITE_OTHER): Payer: Medicare Other | Admitting: Internal Medicine

## 2012-10-14 VITALS — BP 139/73 | HR 60 | Ht 64.0 in | Wt 173.2 lb

## 2012-10-14 DIAGNOSIS — Z950 Presence of cardiac pacemaker: Secondary | ICD-10-CM

## 2012-10-14 DIAGNOSIS — I498 Other specified cardiac arrhythmias: Secondary | ICD-10-CM | POA: Diagnosis not present

## 2012-10-14 DIAGNOSIS — R1011 Right upper quadrant pain: Secondary | ICD-10-CM

## 2012-10-14 DIAGNOSIS — R1084 Generalized abdominal pain: Secondary | ICD-10-CM

## 2012-10-14 DIAGNOSIS — I4891 Unspecified atrial fibrillation: Secondary | ICD-10-CM | POA: Diagnosis not present

## 2012-10-14 LAB — PACEMAKER DEVICE OBSERVATION
AL AMPLITUDE: 5 mv
AL IMPEDENCE PM: 412.5 Ohm
AL THRESHOLD: 1 V
BAMS-0001: 150 {beats}/min
BAMS-0003: 70 {beats}/min
BATTERY VOLTAGE: 2.9629 V
DEVICE MODEL PM: 2285953
RV LEAD AMPLITUDE: 4.6 mv
RV LEAD IMPEDENCE PM: 387.5 Ohm
RV LEAD THRESHOLD: 1 V

## 2012-10-14 MED ORDER — FLECAINIDE ACETATE 50 MG PO TABS: 50.0000 mg | ORAL_TABLET | Freq: Two times a day (BID) | ORAL | Status: DC

## 2012-10-14 NOTE — Progress Notes (Signed)
PCP: Rudi Heap, MD Primary Cardiologist:  [name] is a 67 y.o. female who presents today for routine electrophysiology followup.  Since her recent repeat afib ablation, the patient reports having a significant amount of ERAF.  She called our office and was started on flecainide.  This has significantly improved her afib.  She reports visual changes and unsteadiness with flecainide.   Today, she denies symptoms of chest pain, shortness of breath,  lower extremity edema, dizziness, presyncope, or syncope.  She reports occasional epigastric pain with radiation into her lower back.  The patient is otherwise without complaint today.   Past Medical History  Diagnosis Date  . Impaired glucose tolerance   . Psoriasis   . IBS (irritable bowel syndrome)     vs diarrhea vs abd. fullness   . Chronic anxiety   . Uterine prolapse   . Bronchial spasms   . Hypertension   . Esophageal stricture   . Elevated blood sugar   . Diverticulosis   . Rectocele, female   . History of bladder repair surgery   . Hx of vaginal hysterectomy   . Vaginal prolapse 1998  . Hypothyroidism   . Psoriasis   . Tachy-brady syndrome     a. 08/19/2008 s/p PPM: SJM 2110 Accent  . Complication of anesthesia     " I shake real bad "  . Family history of anesthesia complication     Daughter also shakes while waking up  . GERD (gastroesophageal reflux disease)   . H/O hiatal hernia   . Neuromuscular disorder     periferal neuropathy  . Anemia   . Paroxysmal atrial fibrillation     a. failed flecainide, tikosyn, amio;  b. 01/2012 s/p RFCA.  . H/O scarlet fever   . Pacemaker     St. Jude   Past Surgical History  Procedure Laterality Date  . Vaginal hysterectomy      prolapse   . Bladder suspension    . Rectocele repair    . Transthoracic echocardiogram  2008  . Tee without cardioversion  02/06/2012    Procedure: TRANSESOPHAGEAL ECHOCARDIOGRAM (TEE);  Surgeon: Vesta Mixer, MD;  Location: [facility]  ENDOSCOPY;  Service: Cardiovascular;  Laterality: N/A;  . Atrial fibrillation ablation  02/06/12    PVI by Pamela Johney Frame  . Insert / replace / remove pacemaker      SJM  . Atrial fibrillation ablation  07/04/2012    repeat PVI by Pamela Johney Frame  . Tee without cardioversion N/A 07/04/2012    Procedure: TRANSESOPHAGEAL ECHOCARDIOGRAM (TEE);  Surgeon: Lewayne Bunting, MD;  Location: [facility] ENDOSCOPY;  Service: Cardiovascular;  Laterality: N/A;    Current Outpatient Prescriptions  Medication Sig Dispense Refill  . ARTIFICIAL TEAR OP Place 1 drop into both eyes 2 (two) times daily as needed. For dry eyes      . Calcium Citrate-Vitamin D (CITRACAL PETITES/VITAMIN D PO) Take 2 tablets by mouth daily.      . carvedilol (COREG) 12.5 MG tablet Take 12.5-18.75 mg by mouth 2 (two) times daily with a meal. 1 tablet (12.5 mg) every morning and 1 1/2 tablets (18.75 mg) every night      . clobetasol cream (TEMOVATE) 0.05 % Apply 1 application topically daily as needed (for psoriasis).       . flecainide (TAMBOCOR) 50 MG tablet Take 1 tablet (50 mg total) by mouth 2 (two) times daily.  30 tablet  11  . ibuprofen (ADVIL,MOTRIN) 200 MG  tablet Take 200 mg by mouth as needed for pain.       Marland Kitchen levothyroxine (SYNTHROID) 88 MCG tablet Take 1 tablet (88 mcg total) by mouth daily before breakfast.  90 tablet  3  . Linaclotide (LINZESS) 145 MCG CAPS capsule Take 145 mcg by mouth as needed.      Marland Kitchen LORazepam (ATIVAN) 0.5 MG tablet TAKE 1 TABLET BY MOUTH 3 TIMES A DAY  90 tablet  5  . LUTEIN PO Take 1 capsule by mouth 2 (two) times daily.      . metFORMIN (GLUCOPHAGE-XR) 500 MG 24 hr tablet Take 1 tablet (500 mg total) by mouth daily with breakfast.  30 tablet  2  . methotrexate (RHEUMATREX) 2.5 MG tablet Take 10 mg by mouth once a week. Caution:Chemotherapy. Protect from light.      Marland Kitchen omeprazole (PRILOSEC) 40 MG capsule Take 40 mg by mouth 2 (two) times daily.      . ONE TOUCH ULTRA TEST test strip       . PARoxetine (PAXIL) 10 MG  tablet Take 10 mg by mouth every evening.      Marland Kitchen PHOSPHATIDYLSERINE PO Take 1 tablet by mouth daily.      . Rivaroxaban (XARELTO) 20 MG TABS Take 20 mg by mouth daily with supper.      . rosuvastatin (CRESTOR) 10 MG tablet Take 10 mg by mouth every evening.       . torsemide (DEMADEX) 20 MG tablet Take 20 mg by mouth daily.      . traMADol (ULTRAM) 50 MG tablet Take 1 tablet (50 mg total) by mouth every 8 (eight) hours as needed for pain.  50 tablet  0  . fluticasone (FLONASE) 50 MCG/ACT nasal spray Place 2 sprays into the nose daily.  16 g  6   No current facility-administered medications for this visit.    Physical Exam: Filed Vitals:   10/14/12 1139  BP: 139/73  Pulse: 60  Height: 5\' 4"  (1.626 m)  Weight: 173 lb 3.2 oz (78.563 kg)    GEN- The patient is anxious appearing, alert and oriented x 3 today.   Head- normocephalic, atraumatic Eyes-  Sclera clear, conjunctiva pink Ears- hearing intact Oropharynx- clear Lungs- Clear to ausculation bilaterally, normal work of breathing Chest- pacemaker pocket is well healed Heart- Regular rate and rhythm, no murmurs, rubs or gallops, PMI not laterally displaced GI- soft, NT, ND, + BS Extremities- no clubbing, cyanosis, or edema  Pacemaker interrogation- reviewed in detail today,  See PACEART report ekg today reveals atrial pacing 56 bpm, with PACs, incomplete RBBB, otherwise normal ekg  Assessment and Plan:  1. Paroxysmal atrial fibrillation The patient continues to struggle with afib/ ERAF post ablation.  Her afib burden by PPM interrogation today is 24% and this has improved significantly over the past 2-3 weeks.  I am optimistic that as she continues to heal from her procedure that her afib burden will decrease. She is instructed to decrease flecainide to 50mg  BID at this time to hopefully improve adverse symptoms.  She is instructed to call me if she has further problems.  Continue anticoagulation.  2. Sick sinus syndrome/  symptomatic bradycardia Normal pacemaker function See Pace Art report No changes today  3. Epigastric discomfort Likely GERD.  Will order RUQ [place] to evaluate for cholecystitis/ gall stones as a potential cause.  IF not improved, she is instructed to follow-up with Pamela Jarold Motto  4. Back pain She has chronic back pain which  is worsened recently I have instructed her to follow-up with her PCP at this time  Return in 2 months

## 2012-10-14 NOTE — Patient Instructions (Addendum)
Your physician recommends that you schedule a follow-up appointment in: 2 months with Dr. Johney Frame  Your physician has recommended you make the following change in your medication:  1) Decrease Flecanide to 50 mg twice a day  Your physician would like you to have an abdominal ultrasound to investigate your right upper quadrant pain.

## 2012-10-17 ENCOUNTER — Ambulatory Visit (HOSPITAL_COMMUNITY)
Admission: RE | Admit: 2012-10-17 | Discharge: 2012-10-17 | Disposition: A | Discharge status: Home / Self Care | Payer: Medicare Other | Source: Ambulatory Visit | Attending: Internal Medicine | Admitting: Internal Medicine

## 2012-10-17 ENCOUNTER — Other Ambulatory Visit: Payer: Self-pay

## 2012-10-17 DIAGNOSIS — K7689 Other specified diseases of liver: Secondary | ICD-10-CM | POA: Insufficient documentation

## 2012-10-17 DIAGNOSIS — R1011 Right upper quadrant pain: Secondary | ICD-10-CM

## 2012-10-17 MED ORDER — FLECAINIDE ACETATE 50 MG PO TABS: 50.0000 mg | ORAL_TABLET | Freq: Two times a day (BID) | ORAL | Status: DC

## 2012-10-18 NOTE — Telephone Encounter (Signed)
New Problem  Pt calling about medication// states that it was reduced and has stayed in afib//believes that it is not strong enough.

## 2012-10-18 NOTE — Progress Notes (Unsigned)
View Documentation Note

## 2012-10-22 ENCOUNTER — Telehealth: Payer: Self-pay | Admitting: Internal Medicine

## 2012-10-22 NOTE — Telephone Encounter (Signed)
Follow Up ° ° ° ° ° °Pt returning call. Please call back. °

## 2012-10-22 NOTE — Telephone Encounter (Signed)
Spoke with patient and let her know i would call the pharmacy but it looks as though #60 was called in

## 2012-10-22 NOTE — Telephone Encounter (Signed)
New Prob     Pt states the wrong quantity was called in for her FLECAINIDE. Please call.

## 2012-10-22 NOTE — Telephone Encounter (Signed)
Called patient and left message on her machine that 60 tablets were called into the pharmacy

## 2012-10-23 ENCOUNTER — Telehealth: Payer: Self-pay | Admitting: Family Medicine

## 2012-10-23 MED ORDER — METFORMIN HCL ER 500 MG PO TB24: 500.0000 mg | ORAL_TABLET | Freq: Every day | ORAL | Status: DC

## 2012-10-23 NOTE — Telephone Encounter (Signed)
done

## 2012-10-24 DIAGNOSIS — Z85828 Personal history of other malignant neoplasm of skin: Secondary | ICD-10-CM | POA: Diagnosis not present

## 2012-10-24 DIAGNOSIS — L570 Actinic keratosis: Secondary | ICD-10-CM | POA: Diagnosis not present

## 2012-10-24 DIAGNOSIS — L408 Other psoriasis: Secondary | ICD-10-CM | POA: Diagnosis not present

## 2012-10-31 ENCOUNTER — Encounter: Payer: Self-pay | Admitting: Family Medicine

## 2012-10-31 ENCOUNTER — Ambulatory Visit (INDEPENDENT_AMBULATORY_CARE_PROVIDER_SITE_OTHER): Payer: Medicare Other | Admitting: Family Medicine

## 2012-10-31 VITALS — BP 122/79 | HR 56 | Temp 97.7°F | Ht 64.0 in | Wt 171.0 lb

## 2012-10-31 DIAGNOSIS — E119 Type 2 diabetes mellitus without complications: Secondary | ICD-10-CM

## 2012-10-31 DIAGNOSIS — R071 Chest pain on breathing: Secondary | ICD-10-CM

## 2012-10-31 DIAGNOSIS — R0789 Other chest pain: Secondary | ICD-10-CM

## 2012-10-31 DIAGNOSIS — Z79899 Other long term (current) drug therapy: Secondary | ICD-10-CM | POA: Diagnosis not present

## 2012-10-31 DIAGNOSIS — I4891 Unspecified atrial fibrillation: Secondary | ICD-10-CM

## 2012-10-31 DIAGNOSIS — E785 Hyperlipidemia, unspecified: Secondary | ICD-10-CM | POA: Diagnosis not present

## 2012-10-31 DIAGNOSIS — R5381 Other malaise: Secondary | ICD-10-CM

## 2012-10-31 DIAGNOSIS — I10 Essential (primary) hypertension: Secondary | ICD-10-CM | POA: Diagnosis not present

## 2012-10-31 DIAGNOSIS — I480 Paroxysmal atrial fibrillation: Secondary | ICD-10-CM

## 2012-10-31 DIAGNOSIS — D649 Anemia, unspecified: Secondary | ICD-10-CM | POA: Diagnosis not present

## 2012-10-31 DIAGNOSIS — E538 Deficiency of other specified B group vitamins: Secondary | ICD-10-CM | POA: Diagnosis not present

## 2012-10-31 DIAGNOSIS — E039 Hypothyroidism, unspecified: Secondary | ICD-10-CM

## 2012-10-31 DIAGNOSIS — E559 Vitamin D deficiency, unspecified: Secondary | ICD-10-CM | POA: Diagnosis not present

## 2012-10-31 DIAGNOSIS — I495 Sick sinus syndrome: Secondary | ICD-10-CM

## 2012-10-31 DIAGNOSIS — R5383 Other fatigue: Secondary | ICD-10-CM | POA: Diagnosis not present

## 2012-10-31 LAB — POCT GLYCOSYLATED HEMOGLOBIN (HGB A1C): Hemoglobin A1C: 6.6

## 2012-10-31 LAB — POCT UA - MICROALBUMIN: Microalbumin Ur, POC: 20 mg/L

## 2012-10-31 MED ORDER — LEVOTHYROXINE SODIUM 88 MCG PO TABS: 88.0000 ug | ORAL_TABLET | Freq: Every day | ORAL | Status: DC

## 2012-10-31 MED ORDER — TORSEMIDE 20 MG PO TABS: 20.0000 mg | ORAL_TABLET | Freq: Every day | ORAL | Status: DC

## 2012-10-31 NOTE — Progress Notes (Signed)
Subjective:    Patient ID: [name], female    DOB: 03/02/1945, 67 y.o.   MRN: [id number]  HPI Patient returns to clinic today for followup and management of chronic medical problems. These include hypothyroidism, hypertension, hyperlipidemia, and cardiac arrhythmia issues with atrial fibrillation. She also has a history of glucose intolerance as well as chronic anxiety. Please see the note from the cardiologist in August at which time she was started on flecainide. The last ablation by Dr. Johney Frame was in May. She seems to be having more arrhythmias and was recently restarted on flecainide and because of problems this dosage was reduced. She will see the cardiologist again in a couple of months or sometime in October. She is bothered considerably at times by right upper quadrant pain and back pain. She has had a recent ultrasound of the abdomen which showed a fatty liver and she has had a CT of the abdomen and pelvis and this was basically within normal limits.   Review of Systems  Constitutional: Positive for fatigue.  HENT: Negative.   Eyes: Negative.   Respiratory: Positive for shortness of breath.   Cardiovascular: Positive for palpitations.  Gastrointestinal: Positive for abdominal pain (upper abdominal pain) and constipation (ibs).  Endocrine: Negative.   Genitourinary: Negative.   Musculoskeletal: Positive for arthralgias (right hip pain at times).  Skin: Negative.   Allergic/Immunologic: Negative.   Neurological: Positive for weakness and light-headedness (passed out in shower last sunday- bp dropped 84/48).  Hematological: Negative.   Psychiatric/Behavioral: Negative.        Objective:   Physical Exam BP 122/79  Pulse 56  Temp(Src) 97.7 F (36.5 C) (Oral)  Ht 5\' 4"  (1.626 m)  Wt 171 lb (77.565 kg)  BMI 29.34 kg/m2  The patient appeared well nourished and normally developed, alert and oriented to time and place. Speech, behavior and judgement appear normal. Vital signs  as documented.  Head exam is unremarkable. No scleral icterus or pallor noted. Ears nose and throat were normal. Neck is without jugular venous distension, thyromegally, or carotid bruits. Carotid upstrokes are brisk bilaterally. No cervical adenopathy. Lungs are clear anteriorly and posteriorly to auscultation. Normal respiratory effort. Cardiac exam reveals irregular irregular  rate and rhythm at 84-96 per minute. First and second heart sounds normal. No murmurs, rubs or gallops.  Abdominal exam reveals normal bowl sounds, no masses, no organomegaly and no aortic enlargement. No inguinal adenopathy. There is generalized abdominal tenderness not specific to the right upper quadrant. Extremities are nonedematous and both femoral and pedal pulses are normal. The area of pain seems to be a right lateral wall were chest and right upper quadrant of her abdomen. Skin without pallor or jaundice.  Warm and dry, without rash. Neurologic exam reveals normal deep tendon reflexes and normal sensation. Diabetic foot exam was done          Assessment & Plan:  1. HYPOTHYROIDISM - Thyroid Panel With TSH  2. HYPERTENSION - Hepatic function panel - BMP8+EGFR  3. Atrial fibrillation  4. Other and unspecified hyperlipidemia - Hepatic function panel - NMR, lipoprofile  5. Other malaise and fatigue - Vit D  25 hydroxy (rtn osteoporosis monitoring) - Thyroid Panel With TSH - Anemia Profile B  6. PAF (paroxysmal atrial fibrillation)  7. Tachy-brady syndrome  8. Hypothyroidism - levothyroxine (SYNTHROID) 88 MCG tablet; Take 1 tablet (88 mcg total) by mouth daily before breakfast.  Dispense: 30 tablet; Refill: 3  9. Right-sided chest wall pain -CT of thoracic  spine  10. Diabetes mellitus type 2 controlled -Hemoglobin A1c  Patient Instructions  Continue current medicine Always be careful and don't put herself at risk for falls Drink plenty of fluids  Flu shot in Octoberplus pneumonia  shot We will work on her request for a referral to [facility] to satisfy her mind and her family's mind at her request for her cardiac arrhythmia   Nyra Capes MD

## 2012-10-31 NOTE — Patient Instructions (Addendum)
Continue current medicine Always be careful and don't put herself at risk for falls Drink plenty of fluids  Flu shot in Octoberplus pneumonia shot We will work on her request for a referral to [facility] to satisfy her mind and her family's mind at her request for her cardiac arrhythmia

## 2012-11-01 LAB — MICROALBUMIN, URINE: Microalbumin, Urine: 18.7 ug/mL — ABNORMAL HIGH (ref 0.0–17.0)

## 2012-11-02 LAB — VITAMIN D 25 HYDROXY (VIT D DEFICIENCY, FRACTURES): Vit D, 25-Hydroxy: 47.7 ng/mL (ref 30.0–100.0)

## 2012-11-02 LAB — NMR, LIPOPROFILE
Cholesterol: 108 mg/dL (ref <200)
HDL Cholesterol by NMR: 42 mg/dL (ref >=40)
HDL Particle Number: 28 umol/L — ABNORMAL LOW (ref >=30.5)
LDL Particle Number: 966 nmol/L (ref <1000)
LDL Size: 20 nm — ABNORMAL LOW (ref >20.5)
LDLC SERPL CALC-MCNC: 32 mg/dL (ref <100)
LP-IR Score: 71 — ABNORMAL HIGH (ref <=45)
Small LDL Particle Number: 758 nmol/L — ABNORMAL HIGH (ref <=527)
Triglycerides by NMR: 169 mg/dL — ABNORMAL HIGH (ref <150)

## 2012-11-02 LAB — ANEMIA PROFILE B
Basophils Absolute: 0 10*3/uL (ref 0.0–0.2)
Basos: 0 % (ref 0–3)
Eos: 4 % (ref 0–5)
Eosinophils Absolute: 0.3 10*3/uL (ref 0.0–0.4)
Ferritin: 77 ng/mL (ref 15–150)
Folate: 8.2 ng/mL (ref >3.0)
HCT: 42.1 % (ref 34.0–46.6)
Hemoglobin: 14.1 g/dL (ref 11.1–15.9)
Immature Grans (Abs): 0 10*3/uL (ref 0.0–0.1)
Immature Granulocytes: 0 % (ref 0–2)
Iron Saturation: 25 % (ref 15–55)
Iron: 85 ug/dL (ref 35–155)
Lymphocytes Absolute: 1.9 10*3/uL (ref 0.7–3.1)
Lymphs: 25 % (ref 14–46)
MCH: 30.7 pg (ref 26.6–33.0)
MCHC: 33.5 g/dL (ref 31.5–35.7)
MCV: 92 fL (ref 79–97)
Monocytes Absolute: 0.7 10*3/uL (ref 0.1–0.9)
Monocytes: 10 % (ref 4–12)
Neutrophils Absolute: 4.7 10*3/uL (ref 1.4–7.0)
Neutrophils Relative %: 61 % (ref 40–74)
Platelets: 291 10*3/uL (ref 150–379)
RBC: 4.59 x10E6/uL (ref 3.77–5.28)
RDW: 14.6 % (ref 12.3–15.4)
Retic Ct Pct: 1.2 % (ref 0.6–2.6)
TIBC: 343 ug/dL (ref 250–450)
UIBC: 258 ug/dL (ref 150–375)
Vitamin B-12: 868 pg/mL (ref 211–946)
WBC: 7.7 10*3/uL (ref 3.4–10.8)

## 2012-11-02 LAB — THYROID PANEL WITH TSH
Free Thyroxine Index: 2.9 (ref 1.2–4.9)
T3 Uptake Ratio: 26 % (ref 24–39)
T4, Total: 11 ug/dL (ref 4.5–12.0)
TSH: 3.23 u[IU]/mL (ref 0.450–4.500)

## 2012-11-02 LAB — BMP8+EGFR
BUN/Creatinine Ratio: 23 (ref 11–26)
BUN: 15 mg/dL (ref 8–27)
CO2: 28 mmol/L (ref 18–29)
Calcium: 9.8 mg/dL (ref 8.6–10.2)
Chloride: 100 mmol/L (ref 97–108)
Creatinine, Ser: 0.66 mg/dL (ref 0.57–1.00)
GFR calc Af Amer: 107 mL/min/{1.73_m2} (ref >59)
GFR calc non Af Amer: 93 mL/min/{1.73_m2} (ref >59)
Glucose: 124 mg/dL — ABNORMAL HIGH (ref 65–99)
Potassium: 4.1 mmol/L (ref 3.5–5.2)
Sodium: 142 mmol/L (ref 134–144)

## 2012-11-02 LAB — HEPATIC FUNCTION PANEL
ALT: 35 IU/L — ABNORMAL HIGH (ref 0–32)
AST: 38 IU/L (ref 0–40)
Albumin: 4.2 g/dL (ref 3.6–4.8)
Alkaline Phosphatase: 47 IU/L (ref 39–117)
Bilirubin, Direct: 0.14 mg/dL (ref 0.00–0.40)
Total Bilirubin: 0.4 mg/dL (ref 0.0–1.2)
Total Protein: 6.4 g/dL (ref 6.0–8.5)

## 2012-11-11 ENCOUNTER — Telehealth: Payer: Self-pay | Admitting: Family Medicine

## 2012-11-12 NOTE — Telephone Encounter (Signed)
Pt called - only wanted to know if [facility] would be able to see info needed that is in epic.

## 2012-11-13 DIAGNOSIS — I4891 Unspecified atrial fibrillation: Secondary | ICD-10-CM | POA: Diagnosis not present

## 2012-11-13 DIAGNOSIS — Z950 Presence of cardiac pacemaker: Secondary | ICD-10-CM | POA: Diagnosis not present

## 2012-11-15 ENCOUNTER — Ambulatory Visit (HOSPITAL_COMMUNITY)
Admission: RE | Admit: 2012-11-15 | Discharge: 2012-11-15 | Disposition: A | Discharge status: Home / Self Care | Payer: Medicare Other | Source: Ambulatory Visit | Attending: Family Medicine | Admitting: Family Medicine

## 2012-11-15 DIAGNOSIS — M47814 Spondylosis without myelopathy or radiculopathy, thoracic region: Secondary | ICD-10-CM | POA: Insufficient documentation

## 2012-11-15 DIAGNOSIS — M546 Pain in thoracic spine: Secondary | ICD-10-CM | POA: Diagnosis not present

## 2012-11-15 DIAGNOSIS — R0789 Other chest pain: Secondary | ICD-10-CM

## 2012-11-20 LAB — HM MAMMOGRAPHY

## 2012-11-25 ENCOUNTER — Telehealth: Payer: Self-pay | Admitting: Family Medicine

## 2012-11-27 NOTE — Telephone Encounter (Signed)
DR Christell Constant, PLEASE ADDRESS THE CT DONE AND DECIDE WHAT THE NEXT STEP FOR THIS PT IS. SHE IS STILL IN PAIN- NO BETTER.

## 2012-11-27 NOTE — Telephone Encounter (Signed)
The chest CT spine result showed spondylosis, since pain is continuing and we will have to address the pain I would make a referral to Dr. Ethelene Hal for possible injections in the thoracic spine

## 2012-12-04 ENCOUNTER — Other Ambulatory Visit: Payer: Self-pay | Admitting: Obstetrics and Gynecology

## 2012-12-04 DIAGNOSIS — Z1289 Encounter for screening for malignant neoplasm of other sites: Secondary | ICD-10-CM | POA: Diagnosis not present

## 2012-12-04 DIAGNOSIS — Z1231 Encounter for screening mammogram for malignant neoplasm of breast: Secondary | ICD-10-CM | POA: Diagnosis not present

## 2012-12-04 DIAGNOSIS — Z01419 Encounter for gynecological examination (general) (routine) without abnormal findings: Secondary | ICD-10-CM | POA: Diagnosis not present

## 2012-12-06 NOTE — Telephone Encounter (Signed)
PT ALREADY HAS ORHTO - BEANE, she wants to wait because husband is having a knee replaced.  she will call us if we need to do anything.

## 2012-12-16 ENCOUNTER — Other Ambulatory Visit (INDEPENDENT_AMBULATORY_CARE_PROVIDER_SITE_OTHER): Payer: Medicare Other | Admitting: *Deleted

## 2012-12-16 ENCOUNTER — Ambulatory Visit: Payer: Medicare Other

## 2012-12-16 DIAGNOSIS — Z23 Encounter for immunization: Secondary | ICD-10-CM

## 2012-12-16 NOTE — Progress Notes (Signed)
fluarix and prevnar given pt tolerated well

## 2012-12-23 ENCOUNTER — Other Ambulatory Visit: Payer: Self-pay | Admitting: Gastroenterology

## 2012-12-31 ENCOUNTER — Telehealth: Payer: Self-pay | Admitting: Family Medicine

## 2013-01-01 NOTE — Telephone Encounter (Signed)
Seen [facility] cardio- he is waiting on records from [place] before "doing anything". Going back to [facility] in Feb. - wondered if there was anything we could do to get her in any faster.  I told the pt to call and see if they have a cancellation list, that after your already their pt- they usually do the scheduling.

## 2013-01-02 ENCOUNTER — Other Ambulatory Visit: Payer: Self-pay | Admitting: Internal Medicine

## 2013-01-06 ENCOUNTER — Telehealth: Payer: Self-pay | Admitting: Family Medicine

## 2013-01-07 MED ORDER — PAROXETINE HCL 10 MG PO TABS: 10.0000 mg | ORAL_TABLET | Freq: Every evening | ORAL | Status: DC

## 2013-01-07 NOTE — Telephone Encounter (Signed)
done

## 2013-01-09 ENCOUNTER — Encounter: Payer: Self-pay | Admitting: Internal Medicine

## 2013-01-09 ENCOUNTER — Ambulatory Visit (INDEPENDENT_AMBULATORY_CARE_PROVIDER_SITE_OTHER): Payer: Medicare Other | Admitting: Internal Medicine

## 2013-01-09 VITALS — BP 157/72 | HR 88 | Ht 64.0 in | Wt 172.1 lb

## 2013-01-09 DIAGNOSIS — I498 Other specified cardiac arrhythmias: Secondary | ICD-10-CM | POA: Diagnosis not present

## 2013-01-09 DIAGNOSIS — Z950 Presence of cardiac pacemaker: Secondary | ICD-10-CM

## 2013-01-09 DIAGNOSIS — I4891 Unspecified atrial fibrillation: Secondary | ICD-10-CM | POA: Diagnosis not present

## 2013-01-09 DIAGNOSIS — I495 Sick sinus syndrome: Secondary | ICD-10-CM | POA: Diagnosis not present

## 2013-01-09 NOTE — Patient Instructions (Signed)
Your physician recommends that you schedule a follow-up appointment in: 4 weeks with Dr Allred.  

## 2013-01-10 LAB — MDC_IDC_ENUM_SESS_TYPE_INCLINIC
Battery Remaining Longevity: 123.6 mo
Battery Voltage: 2.96 V
Brady Statistic RA Percent Paced: 5.9 %
Brady Statistic RV Percent Paced: 3.4 %
Date Time Interrogation Session: 20141120204344
Implantable Pulse Generator Model: 2110
Implantable Pulse Generator Serial Number: 2285953
Lead Channel Impedance Value: 400 Ohm
Lead Channel Impedance Value: 412.5 Ohm
Lead Channel Pacing Threshold Amplitude: 0.75 V
Lead Channel Pacing Threshold Amplitude: 0.75 V
Lead Channel Pacing Threshold Amplitude: 1.25 V
Lead Channel Pacing Threshold Amplitude: 1.25 V
Lead Channel Pacing Threshold Pulse Width: 0.4 ms
Lead Channel Pacing Threshold Pulse Width: 0.4 ms
Lead Channel Pacing Threshold Pulse Width: 0.8 ms
Lead Channel Pacing Threshold Pulse Width: 0.8 ms
Lead Channel Sensing Intrinsic Amplitude: 5 mV
Lead Channel Sensing Intrinsic Amplitude: 5 mV
Lead Channel Setting Pacing Amplitude: 2 V
Lead Channel Setting Pacing Amplitude: 2.5 V
Lead Channel Setting Pacing Pulse Width: 0.8 ms
Lead Channel Setting Sensing Sensitivity: 2 mV

## 2013-01-12 ENCOUNTER — Other Ambulatory Visit: Payer: Self-pay | Admitting: Family Medicine

## 2013-01-12 NOTE — Progress Notes (Signed)
PCP: Rudi Heap, MD Primary Cardiologist:  Dr Inez Catalina is a 67 y.o. female who presents today for routine electrophysiology followup.  She continues to have intermittent afib.  She does not tolerate her afib very well.  She cannot tolerate more than 50mg  bid of flecainide.  She recently went to [facility] for a second opinion and saw Dr Tristan Schroeder.  They discussed repeat ablation she says (though I do not have any records).  She is quite frustrated by her afib.   Today, she denies symptoms of chest pain, shortness of breath,  lower extremity edema, dizziness, presyncope, or syncope.  She continues to have back/ flank pain for which she follows with Dr Christell Constant.  The patient is otherwise without complaint today.   Past Medical History  Diagnosis Date  . Impaired glucose tolerance   . Psoriasis   . IBS (irritable bowel syndrome)     vs diarrhea vs abd. fullness   . Chronic anxiety   . Uterine prolapse   . Bronchial spasms   . Hypertension   . Esophageal stricture   . Elevated blood sugar   . Diverticulosis   . Rectocele, female   . History of bladder repair surgery   . Hx of vaginal hysterectomy   . Vaginal prolapse 1998  . Hypothyroidism   . Psoriasis   . Tachy-brady syndrome     a. 08/19/2008 s/p PPM: SJM 2110 Accent  . Complication of anesthesia     " I shake real bad "  . Family history of anesthesia complication     Daughter also shakes while waking up  . GERD (gastroesophageal reflux disease)   . H/O hiatal hernia   . Neuromuscular disorder     periferal neuropathy  . Anemia   . Paroxysmal atrial fibrillation     a. failed flecainide, tikosyn, amio;  b. 01/2012 s/p RFCA.  . H/O scarlet fever   . Pacemaker     St. Jude   Past Surgical History  Procedure Laterality Date  . Vaginal hysterectomy      prolapse   . Bladder suspension    . Rectocele repair    . Transthoracic echocardiogram  2008  . Tee without cardioversion  02/06/2012    Procedure: TRANSESOPHAGEAL  ECHOCARDIOGRAM (TEE);  Surgeon: Vesta Mixer, MD;  Location: [facility] ENDOSCOPY;  Service: Cardiovascular;  Laterality: N/A;  . Atrial fibrillation ablation  02/06/12    PVI by Dr Johney Frame  . Insert / replace / remove pacemaker      SJM  . Atrial fibrillation ablation  07/04/2012    repeat PVI by Dr Johney Frame  . Tee without cardioversion N/A 07/04/2012    Procedure: TRANSESOPHAGEAL ECHOCARDIOGRAM (TEE);  Surgeon: Lewayne Bunting, MD;  Location: [facility] ENDOSCOPY;  Service: Cardiovascular;  Laterality: N/A;    Current Outpatient Prescriptions  Medication Sig Dispense Refill  . ARTIFICIAL TEAR OP Place 1 drop into both eyes 2 (two) times daily as needed. For dry eyes      . Calcium Citrate-Vitamin D (CITRACAL PETITES/VITAMIN D PO) Take 2 tablets by mouth daily.      . carvedilol (COREG) 12.5 MG tablet Take 12.5-18.75 mg by mouth 2 (two) times daily with a meal. 1 tablet (12.5 mg) every morning and 1 1/2 tablets (18.75 mg) every night      . clobetasol cream (TEMOVATE) 0.05 % Apply 1 application topically daily as needed (for psoriasis).       . flecainide (TAMBOCOR) 50 MG tablet  Take 1 tablet (50 mg total) by mouth 2 (two) times daily.  60 tablet  11  . fluticasone (FLONASE) 50 MCG/ACT nasal spray Place 2 sprays into the nose as needed.      Marland Kitchen levothyroxine (SYNTHROID) 88 MCG tablet Take 1 tablet (88 mcg total) by mouth daily before breakfast.  30 tablet  3  . Linaclotide (LINZESS) 145 MCG CAPS capsule Take 145 mcg by mouth as needed.      Marland Kitchen LORazepam (ATIVAN) 0.5 MG tablet TAKE 1 TABLET BY MOUTH 3 TIMES A DAY  90 tablet  5  . LUTEIN PO Take 1 capsule by mouth 2 (two) times daily.      . metFORMIN (GLUCOPHAGE-XR) 500 MG 24 hr tablet Take 1 tablet (500 mg total) by mouth daily with breakfast.  30 tablet  2  . methotrexate (RHEUMATREX) 2.5 MG tablet Take 10 mg by mouth once a week. Caution:Chemotherapy. Protect from light.      Marland Kitchen omeprazole (PRILOSEC) 40 MG capsule TAKE ONE CAPSULE BY MOUTH TWICE DAILY  60  capsule  0  . PARoxetine (PAXIL) 10 MG tablet Take 1 tablet (10 mg total) by mouth every evening.  30 tablet  1  . PHOSPHATIDYLSERINE PO Take 1 tablet by mouth daily.      . rosuvastatin (CRESTOR) 10 MG tablet Take 10 mg by mouth every evening.       . torsemide (DEMADEX) 20 MG tablet Take 1 tablet (20 mg total) by mouth daily.  90 tablet  3  . traMADol (ULTRAM) 50 MG tablet Take 1 tablet (50 mg total) by mouth every 8 (eight) hours as needed for pain.  50 tablet  0  . XARELTO 20 MG TABS tablet TAKE ONE TABLET BY MOUTH EVERY DAY  30 tablet  6   No current facility-administered medications for this visit.    Physical Exam: Filed Vitals:   01/09/13 1428  BP: 157/72  Pulse: 88  Height: 5\' 4"  (1.626 m)  Weight: 172 lb 1.9 oz (78.073 kg)    GEN- The patient is anxious appearing, alert and oriented x 3 today.   Head- normocephalic, atraumatic Eyes-  Sclera clear, conjunctiva pink Ears- hearing intact Oropharynx- clear Lungs- Clear to ausculation bilaterally, normal work of breathing Chest- pacemaker pocket is well healed Heart- Regular rate and rhythm, no murmurs, rubs or gallops, PMI not laterally displaced GI- soft, NT, ND, + BS Extremities- no clubbing, cyanosis, or edema  Pacemaker interrogation- reviewed in detail today,  See PACEART report  Assessment and Plan:  1. Paroxysmal atrial fibrillation The patient continues to struggle with afib post ablation x 2.  Her afib burden by PPM interrogation today is 18%.  She is quite frustrated by her afib and her lack of response to ablation.  We discussed her options at length.  I do not think that a third PVI would be the correct approach for her and would discourage this approach.  I told her that she should consider a convergent procedure at this point.  I have encouraged her to consider referral to [facility] to discuss this option with Dr Sarajane Marek.  I don't think that this option presently exists at [facility]. She would like to contemplate  her options of 1. Referral to [facility], 2. Trying another AAD, or 3. Returning to [facility] for further discussion.  She will return to our office in 6 weeks in follow-up.  She says that her husband is also frustrated by her afib.  I have therefore  encouraged her to have him come with her when she returns to our office in 6 weeks.  Though afib is a challenging and frustrating problem for some patients, I have encouraged her to remain optimistic and to avoid getting frustrated as we continue to work towards the common goal of her clinical success.  I have encouraged her to continue anticoagulation.  2. Sick sinus syndrome/ symptomatic bradycardia Normal pacemaker function See Pace Art report No changes today  Return in 6 weeks

## 2013-01-13 ENCOUNTER — Other Ambulatory Visit: Payer: Self-pay | Admitting: Family Medicine

## 2013-01-14 NOTE — Telephone Encounter (Signed)
Last filled 06/28/12 with 5 RF, last seen 10/31/12. Route to pool A, call into [facility] [phone]

## 2013-01-14 NOTE — Telephone Encounter (Signed)
This is okay to refill her 6 mo

## 2013-01-14 NOTE — Telephone Encounter (Signed)
Med called to pharm 

## 2013-01-20 ENCOUNTER — Other Ambulatory Visit: Payer: Self-pay | Admitting: Gastroenterology

## 2013-02-05 ENCOUNTER — Other Ambulatory Visit: Payer: Self-pay | Admitting: *Deleted

## 2013-02-05 MED ORDER — CARVEDILOL 12.5 MG PO TABS: 12.5000 mg | ORAL_TABLET | Freq: Two times a day (BID) | ORAL | Status: DC

## 2013-02-06 ENCOUNTER — Encounter: Payer: Self-pay | Admitting: *Deleted

## 2013-02-06 NOTE — Telephone Encounter (Signed)
This encounter was created in error - please disregard.

## 2013-02-07 ENCOUNTER — Ambulatory Visit (INDEPENDENT_AMBULATORY_CARE_PROVIDER_SITE_OTHER): Payer: Medicare Other | Admitting: Internal Medicine

## 2013-02-07 ENCOUNTER — Encounter: Payer: Self-pay | Admitting: Internal Medicine

## 2013-02-07 VITALS — BP 166/79 | HR 57 | Wt 171.0 lb

## 2013-02-07 DIAGNOSIS — I4891 Unspecified atrial fibrillation: Secondary | ICD-10-CM | POA: Diagnosis not present

## 2013-02-07 DIAGNOSIS — I495 Sick sinus syndrome: Secondary | ICD-10-CM

## 2013-02-07 DIAGNOSIS — I480 Paroxysmal atrial fibrillation: Secondary | ICD-10-CM

## 2013-02-07 LAB — MDC_IDC_ENUM_SESS_TYPE_INCLINIC
Battery Remaining Longevity: 128.4 mo
Battery Voltage: 2.96 V
Brady Statistic RA Percent Paced: 7.2 %
Brady Statistic RV Percent Paced: 0.95 %
Date Time Interrogation Session: 20141219144055
Implantable Pulse Generator Model: 2110
Implantable Pulse Generator Serial Number: 2285953
Lead Channel Impedance Value: 400 Ohm
Lead Channel Impedance Value: 412.5 Ohm
Lead Channel Pacing Threshold Amplitude: 1.375 V
Lead Channel Pacing Threshold Amplitude: 1.625 V
Lead Channel Pacing Threshold Pulse Width: 0.4 ms
Lead Channel Pacing Threshold Pulse Width: 0.5 ms
Lead Channel Sensing Intrinsic Amplitude: 4.7 mV
Lead Channel Sensing Intrinsic Amplitude: 5 mV
Lead Channel Setting Pacing Amplitude: 2 V
Lead Channel Setting Pacing Amplitude: 2.5 V
Lead Channel Setting Pacing Pulse Width: 0.8 ms
Lead Channel Setting Sensing Sensitivity: 2 mV

## 2013-02-07 NOTE — Patient Instructions (Signed)
Your physician recommends that you schedule a follow-up appointment in: 3 months with Dr Johney Frame  You have been referred to Dr Wynona Canes will call you with an appointment

## 2013-02-11 DIAGNOSIS — M47814 Spondylosis without myelopathy or radiculopathy, thoracic region: Secondary | ICD-10-CM | POA: Diagnosis not present

## 2013-02-13 ENCOUNTER — Encounter: Payer: Self-pay | Admitting: Family Medicine

## 2013-02-14 NOTE — Progress Notes (Signed)
PCP: Rudi Heap, MD Primary Cardiologist:  Dr Inez Catalina is a 67 y.o. female who presents today for routine electrophysiology followup.  She continues to have intermittent afib.  She does not tolerate her afib very well.  She cannot tolerate more than 50mg  bid of flecainide.  She recently went to [facility] for a second opinion and saw Dr Tristan Schroeder.  They discussed repeat ablation she says (though I do not have any records).  She is quite frustrated by her afib.  She brings her husband with her today. They report that she is "miserable all of the time".  She seems generally unhappy with her quality of life.  She cannot exercise due to pain related to her peripheral neuropathy.  Her spouse would like to see her more active and exercising more.   Today, she denies symptoms of chest pain, shortness of breath,  lower extremity edema, dizziness, presyncope, or syncope.  She continues to have back/ flank pain for which she follows with Dr Christell Constant.  The patient is otherwise without complaint today.   Past Medical History  Diagnosis Date  . Impaired glucose tolerance   . Psoriasis   . IBS (irritable bowel syndrome)     vs diarrhea vs abd. fullness   . Chronic anxiety   . Uterine prolapse   . Bronchial spasms   . Hypertension   . Esophageal stricture   . Elevated blood sugar   . Diverticulosis   . Rectocele, female   . History of bladder repair surgery   . Hx of vaginal hysterectomy   . Vaginal prolapse 1998  . Hypothyroidism   . Psoriasis   . Tachy-brady syndrome     a. 08/19/2008 s/p PPM: SJM 2110 Accent  . Complication of anesthesia     " I shake real bad "  . Family history of anesthesia complication     Daughter also shakes while waking up  . GERD (gastroesophageal reflux disease)   . H/O hiatal hernia   . Neuromuscular disorder     periferal neuropathy  . Anemia   . Paroxysmal atrial fibrillation     a. failed flecainide, tikosyn, amio;  b. 01/2012 s/p RFCA.  . H/O scarlet fever    . Pacemaker     St. Jude   Past Surgical History  Procedure Laterality Date  . Vaginal hysterectomy      prolapse   . Bladder suspension    . Rectocele repair    . Transthoracic echocardiogram  2008  . Tee without cardioversion  02/06/2012    Procedure: TRANSESOPHAGEAL ECHOCARDIOGRAM (TEE);  Surgeon: Vesta Mixer, MD;  Location: [facility] ENDOSCOPY;  Service: Cardiovascular;  Laterality: N/A;  . Atrial fibrillation ablation  02/06/12    PVI by Dr Johney Frame  . Insert / replace / remove pacemaker      SJM  . Atrial fibrillation ablation  07/04/2012    repeat PVI by Dr Johney Frame  . Tee without cardioversion N/A 07/04/2012    Procedure: TRANSESOPHAGEAL ECHOCARDIOGRAM (TEE);  Surgeon: Lewayne Bunting, MD;  Location: [facility] ENDOSCOPY;  Service: Cardiovascular;  Laterality: N/A;    Current Outpatient Prescriptions  Medication Sig Dispense Refill  . ARTIFICIAL TEAR OP Place 1 drop into both eyes 2 (two) times daily as needed. For dry eyes      . Calcium Citrate-Vitamin D (CITRACAL PETITES/VITAMIN D PO) Take 2 tablets by mouth daily.      . carvedilol (COREG) 12.5 MG tablet Take 1-1.5 tablets (12.5-18.75 mg  total) by mouth 2 (two) times daily with a meal. 1 tablet every morning and 1 1/2 tablets every night  75 tablet  0  . clobetasol cream (TEMOVATE) 0.05 % Apply 1 application topically daily as needed (for psoriasis).       . flecainide (TAMBOCOR) 50 MG tablet Take 1 tablet (50 mg total) by mouth 2 (two) times daily.  60 tablet  11  . fluticasone (FLONASE) 50 MCG/ACT nasal spray Place 2 sprays into the nose as needed.      Marland Kitchen levothyroxine (SYNTHROID) 88 MCG tablet Take 1 tablet (88 mcg total) by mouth daily before breakfast.  30 tablet  3  . Linaclotide (LINZESS) 145 MCG CAPS capsule Take 145 mcg by mouth as needed.      Marland Kitchen LORazepam (ATIVAN) 0.5 MG tablet TAKE 1 TABLET BY MOUTH THREE TIMES DAILY  90 tablet  1  . LUTEIN PO Take 1 capsule by mouth 2 (two) times daily.      . metFORMIN (GLUCOPHAGE-XR) 500  MG 24 hr tablet TAKE 1 TABLET BY MOUTH DAILY WITH BREAKFAST  30 tablet  1  . methotrexate (RHEUMATREX) 2.5 MG tablet Take 10 mg by mouth once a week. Caution:Chemotherapy. Protect from light.      Marland Kitchen omeprazole (PRILOSEC) 40 MG capsule TAKE 1 CAPSULE BY MOUTH TWICE DAILY  60 capsule  4  . PARoxetine (PAXIL) 10 MG tablet Take 1 tablet (10 mg total) by mouth every evening.  30 tablet  1  . PHOSPHATIDYLSERINE PO Take 1 tablet by mouth daily.      . rosuvastatin (CRESTOR) 10 MG tablet Take 10 mg by mouth every evening.       . torsemide (DEMADEX) 20 MG tablet Take 1 tablet (20 mg total) by mouth daily.  90 tablet  3  . traMADol (ULTRAM) 50 MG tablet Take 1 tablet (50 mg total) by mouth every 8 (eight) hours as needed for pain.  50 tablet  0  . XARELTO 20 MG TABS tablet TAKE ONE TABLET BY MOUTH EVERY DAY  30 tablet  6   No current facility-administered medications for this visit.    Physical Exam: Filed Vitals:   02/07/13 1309  BP: 166/79  Pulse: 57  Weight: 171 lb (77.565 kg)    GEN- The patient is genrally unhappy appearing, alert and oriented x 3 today.   Head- normocephalic, atraumatic Eyes-  Sclera clear, conjunctiva pink Ears- hearing intact Oropharynx- clear Lungs- Clear to ausculation bilaterally, normal work of breathing Chest- pacemaker pocket is well healed Heart- Regular rate and rhythm, no murmurs, rubs or gallops, PMI not laterally displaced GI- soft, NT, ND, + BS Extremities- no clubbing, cyanosis, or edema  Pacemaker interrogation- reviewed in detail today,  See PACEART report  Assessment and Plan:  1. Paroxysmal atrial fibrillation The patient continues to struggle with afib post ablation x 2.  Her afib burden by PPM interrogation today is only 8% yet she says she is miserable all of the time.  I have informed her and her spouse to consider that her misery seems to be out of proportion to her afib burden and therefore may need to be addressed with her primary care  physician.  I agree with her spouse that regular exercise and weight reduction could be beneficial.  She is quite frustrated by her afib and her lack of response to ablation.  We discussed her options at length.  I do not think that a third PVI would be the correct  approach for her and would discourage this approach.  I told her that she should consider a convergent procedure at this point.  I have encouraged her to consider referral to [facility] to discuss this option with Dr Sarajane Marek.  She is reluctant to consider other medicines presently.  She and her spouse are willing to consider convergent consultation at [facility].  I think that this is the best option presently for managing her afib. She is clearly symptomatic with afib.  2. Sick sinus syndrome/ symptomatic bradycardia Normal pacemaker function See Arita Miss Art report No changes today  Return in 3 months

## 2013-02-20 DIAGNOSIS — K529 Noninfective gastroenteritis and colitis, unspecified: Secondary | ICD-10-CM

## 2013-02-20 HISTORY — DX: Noninfective gastroenteritis and colitis, unspecified: K52.9

## 2013-02-21 ENCOUNTER — Other Ambulatory Visit: Payer: Self-pay | Admitting: Family Medicine

## 2013-02-28 DIAGNOSIS — Z45018 Encounter for adjustment and management of other part of cardiac pacemaker: Secondary | ICD-10-CM | POA: Diagnosis not present

## 2013-02-28 DIAGNOSIS — I4891 Unspecified atrial fibrillation: Secondary | ICD-10-CM | POA: Diagnosis not present

## 2013-02-28 DIAGNOSIS — Z950 Presence of cardiac pacemaker: Secondary | ICD-10-CM | POA: Diagnosis not present

## 2013-03-03 ENCOUNTER — Other Ambulatory Visit: Payer: Self-pay | Admitting: Internal Medicine

## 2013-03-03 ENCOUNTER — Other Ambulatory Visit: Payer: Self-pay | Admitting: Family Medicine

## 2013-03-05 NOTE — Telephone Encounter (Signed)
Last seen 10/31/12 DWM

## 2013-03-12 DIAGNOSIS — I4891 Unspecified atrial fibrillation: Secondary | ICD-10-CM | POA: Diagnosis not present

## 2013-03-13 ENCOUNTER — Telehealth: Payer: Self-pay | Admitting: Internal Medicine

## 2013-03-13 ENCOUNTER — Ambulatory Visit (INDEPENDENT_AMBULATORY_CARE_PROVIDER_SITE_OTHER): Payer: Medicare Other | Admitting: Family Medicine

## 2013-03-13 ENCOUNTER — Encounter: Payer: Self-pay | Admitting: Family Medicine

## 2013-03-13 VITALS — BP 157/73 | HR 55 | Temp 98.0°F | Ht 64.0 in | Wt 172.0 lb

## 2013-03-13 DIAGNOSIS — E8881 Metabolic syndrome: Secondary | ICD-10-CM | POA: Diagnosis not present

## 2013-03-13 DIAGNOSIS — E1142 Type 2 diabetes mellitus with diabetic polyneuropathy: Secondary | ICD-10-CM

## 2013-03-13 DIAGNOSIS — E785 Hyperlipidemia, unspecified: Secondary | ICD-10-CM | POA: Diagnosis not present

## 2013-03-13 DIAGNOSIS — E559 Vitamin D deficiency, unspecified: Secondary | ICD-10-CM | POA: Diagnosis not present

## 2013-03-13 DIAGNOSIS — I10 Essential (primary) hypertension: Secondary | ICD-10-CM

## 2013-03-13 DIAGNOSIS — E039 Hypothyroidism, unspecified: Secondary | ICD-10-CM | POA: Diagnosis not present

## 2013-03-13 DIAGNOSIS — Z79899 Other long term (current) drug therapy: Secondary | ICD-10-CM | POA: Insufficient documentation

## 2013-03-13 DIAGNOSIS — I4891 Unspecified atrial fibrillation: Secondary | ICD-10-CM

## 2013-03-13 DIAGNOSIS — E1149 Type 2 diabetes mellitus with other diabetic neurological complication: Secondary | ICD-10-CM | POA: Diagnosis not present

## 2013-03-13 DIAGNOSIS — E1140 Type 2 diabetes mellitus with diabetic neuropathy, unspecified: Secondary | ICD-10-CM

## 2013-03-13 LAB — POCT CBC
Granulocyte percent: 53.7 %G (ref 37–80)
HCT, POC: 39 % (ref 37.7–47.9)
Hemoglobin: 12.6 g/dL (ref 12.2–16.2)
Lymph, poc: 2.5 (ref 0.6–3.4)
MCH, POC: 29.3 pg (ref 27–31.2)
MCHC: 32.5 g/dL (ref 31.8–35.4)
MCV: 90.4 fL (ref 80–97)
MPV: 6.3 fL (ref 0–99.8)
POC Granulocyte: 3.7 (ref 2–6.9)
POC LYMPH PERCENT: 35.8 %L (ref 10–50)
Platelet Count, POC: 273 10*3/uL (ref 142–424)
RBC: 4.3 M/uL (ref 4.04–5.48)
RDW, POC: 15.2 %
WBC: 6.9 10*3/uL (ref 4.6–10.2)

## 2013-03-13 LAB — POCT GLYCOSYLATED HEMOGLOBIN (HGB A1C): Hemoglobin A1C: 6.7

## 2013-03-13 NOTE — Telephone Encounter (Signed)
New Prob    Calling requesting a cardiac cath prior to a procedure. Please call.

## 2013-03-13 NOTE — Patient Instructions (Addendum)
Continue current medications. Continue good therapeutic lifestyle changes which include good diet and exercise. Fall precautions discussed with patient. Schedule your flu vaccine if you haven't had it yet If you are over 66 years old - you may need Prevnar 105 or the adult Pneumonia vaccine. Do not forget to get your eye exam Return the FOBT We will call you of the lab work once those results are available

## 2013-03-13 NOTE — Progress Notes (Signed)
Subjective:    Patient ID: [name], female    DOB: December 26, 1945, 68 y.o.   MRN: [id number]  HPI Pt here for follow up and management of chronic medical problems. Patient is still having problems with cardiac arrhythmias. She is seeing doctors at [facility] and another ablation procedure is being planned. She indicates that before this is done she will have a cardiac catheterization. On health maintenance issues she needs to return in FOBT. She will get lab work today with a hemoglobin A1c.        Patient Active Problem List   Diagnosis Date Noted  . Fever, unspecified 06/05/2012  . Generalized abdominal pain 06/05/2012  . PAF (paroxysmal atrial fibrillation) 02/07/2012  . Tachy-brady syndrome   . Impaired glucose tolerance   . FATIGUE 12/22/2009  . SNORING 12/22/2009  . OTHER CHRONIC NONALCOHOLIC LIVER DISEASE 72/10/4707  . NONSPEC ELEVATION OF LEVELS OF TRANSAMINASE/LDH 03/09/2009  . PPM-St.Jude 09/01/2008  . BRADYCARDIA 08/13/2008  . HYPOTHYROIDISM 06/09/2008  . HYPERTENSION 06/09/2008  . Atrial fibrillation 06/09/2008  . PREMATURE VENTRICULAR CONTRACTIONS 06/09/2008  . IRRITABLE BOWEL SYNDROME 06/09/2008  . PSORIASIS 06/09/2008  . EDEMA 06/09/2008  . DIVERTICULOSIS, COLON, HX OF 06/09/2008   Outpatient Encounter Prescriptions as of 03/13/2013  Medication Sig  . ARTIFICIAL TEAR OP Place 1 drop into both eyes 2 (two) times daily as needed. For dry eyes  . Calcium Citrate-Vitamin D (CITRACAL PETITES/VITAMIN D PO) Take 2 tablets by mouth daily.  . carvedilol (COREG) 12.5 MG tablet TAKE 1 TABLET BY MOUTH EVERY MORNING AND 1& 1/2 TABLETS EVERY NIGHT  . clobetasol cream (TEMOVATE) 6.28 % Apply 1 application topically daily as needed (for psoriasis).   . flecainide (TAMBOCOR) 50 MG tablet Take 1 tablet (50 mg total) by mouth 2 (two) times daily.  . fluticasone (FLONASE) 50 MCG/ACT nasal spray Place 2 sprays into the nose as needed.  Marland Kitchen levothyroxine (SYNTHROID,  LEVOTHROID) 88 MCG tablet TAKE 1 TABLET BY MOUTH DAILY BEFORE BREAKFAST  . Linaclotide (LINZESS) 145 MCG CAPS capsule Take 145 mcg by mouth as needed.  Marland Kitchen LORazepam (ATIVAN) 0.5 MG tablet TAKE 1 TABLET BY MOUTH THREE TIMES DAILY  . LUTEIN PO Take 1 capsule by mouth 2 (two) times daily.  . metFORMIN (GLUCOPHAGE-XR) 500 MG 24 hr tablet TAKE 1 TABLET BY MOUTH DAILY WITH BREAKFAST  . methotrexate (RHEUMATREX) 2.5 MG tablet Take 10 mg by mouth once a week. Caution:Chemotherapy. Protect from light.  Marland Kitchen omeprazole (PRILOSEC) 40 MG capsule TAKE 1 CAPSULE BY MOUTH TWICE DAILY  . PARoxetine (PAXIL) 10 MG tablet TAKE 1 TABLET BY MOUTH EVERY EVENING  . PHOSPHATIDYLSERINE PO Take 1 tablet by mouth daily.  . rosuvastatin (CRESTOR) 10 MG tablet Take 10 mg by mouth every evening.   . torsemide (DEMADEX) 20 MG tablet Take 1 tablet (20 mg total) by mouth daily.  . traMADol (ULTRAM) 50 MG tablet Take 1 tablet (50 mg total) by mouth every 8 (eight) hours as needed for pain.  Marland Kitchen XARELTO 20 MG TABS tablet TAKE ONE TABLET BY MOUTH EVERY DAY    Review of Systems  Constitutional: Negative.   HENT: Negative.   Eyes: Negative.   Respiratory: Negative.   Cardiovascular: Negative.        Cardiac ablation and heart cath to be scheduled at [place].  Gastrointestinal: Negative.   Endocrine: Negative.   Genitourinary: Negative.   Musculoskeletal: Negative.   Skin: Negative.   Allergic/Immunologic: Negative.   Neurological: Negative.  Hematological: Negative.   Psychiatric/Behavioral: Negative.        Objective:   Physical Exam  Nursing note and vitals reviewed. Constitutional: She is oriented to person, place, and time. She appears well-developed and well-nourished. No distress.  Pleasant and cooperative  HENT:  Head: Normocephalic and atraumatic.  Right Ear: External ear normal.  Left Ear: External ear normal.  Nose: Nose normal.  Mouth/Throat: Oropharynx is clear and moist.  Eyes: Conjunctivae and  EOM are normal. Pupils are equal, round, and reactive to light. Right eye exhibits no discharge. Left eye exhibits no discharge. No scleral icterus.  Neck: Normal range of motion. Neck supple. No JVD present. No thyromegaly present.  No carotid bruits  Cardiovascular: Normal rate, normal heart sounds and intact distal pulses.  Exam reveals no gallop and no friction rub.   No murmur heard. Slightly irregular at 60 per minute  Pulmonary/Chest: Effort normal and breath sounds normal. No respiratory distress. She has no wheezes. She has no rales. She exhibits no tenderness.  Abdominal: Soft. Bowel sounds are normal. She exhibits no mass. There is tenderness (Slight generalized abdominal tenderness). There is no rebound and no guarding.  Musculoskeletal: Normal range of motion. She exhibits no edema and no tenderness.  Lymphadenopathy:    She has no cervical adenopathy.  Neurological: She is alert and oriented to person, place, and time. She has normal reflexes. No cranial nerve deficit.  Skin: Skin is warm and dry.  Psychiatric: She has a normal mood and affect. Her behavior is normal. Judgment and thought content normal.   BP 157/73  Pulse 55  Temp(Src) 98 F (36.7 C) (Oral)  Ht $R'5\' 4"'ah$  (1.626 m)  Wt 172 lb (78.019 kg)  BMI 29.51 kg/m2        Assessment & Plan:  1. Atrial fibrillation - POCT CBC  2. HYPERTENSION - POCT CBC - BMP8+EGFR - Hepatic function panel  3. HYPOTHYROIDISM - POCT CBC - Thyroid Panel With TSH  4. Vitamin D deficiency - POCT CBC - Vit D  25 hydroxy (rtn osteoporosis monitoring)  5. Hyperlipidemia - POCT CBC - NMR, lipoprofile  6. Metabolic syndrome - POCT glycosylated hemoglobin (Hb A1C)  7. High risk medication use  8. Diabetes mellitus with neuropathy  Patient Instructions  Continue current medications. Continue good therapeutic lifestyle changes which include good diet and exercise. Fall precautions discussed with patient. Schedule your flu  vaccine if you haven't had it yet If you are over 80 years old - you may need Prevnar 83 or the adult Pneumonia vaccine. Do not forget to get your eye exam Return the FOBT We will call you of the lab work once those results are available   Arrie Senate MD

## 2013-03-14 NOTE — Telephone Encounter (Signed)
Left message for [name] to return my call

## 2013-03-15 LAB — THYROID PANEL WITH TSH
Free Thyroxine Index: 2.9 (ref 1.2–4.9)
T3 Uptake Ratio: 24 % (ref 24–39)
T4, Total: 12.1 ug/dL — ABNORMAL HIGH (ref 4.5–12.0)
TSH: 2.13 u[IU]/mL (ref 0.450–4.500)

## 2013-03-15 LAB — HEPATIC FUNCTION PANEL
ALT: 38 IU/L — ABNORMAL HIGH (ref 0–32)
AST: 40 IU/L (ref 0–40)
Albumin: 4.1 g/dL (ref 3.6–4.8)
Alkaline Phosphatase: 48 IU/L (ref 39–117)
Bilirubin, Direct: 0.16 mg/dL (ref 0.00–0.40)
Total Bilirubin: 0.4 mg/dL (ref 0.0–1.2)
Total Protein: 6.4 g/dL (ref 6.0–8.5)

## 2013-03-15 LAB — NMR, LIPOPROFILE
Cholesterol: 93 mg/dL (ref <200)
HDL Cholesterol by NMR: 37 mg/dL — ABNORMAL LOW (ref >=40)
HDL Particle Number: 29.9 umol/L — ABNORMAL LOW (ref >=30.5)
LDL Particle Number: 609 nmol/L (ref <1000)
LDL Size: 20.5 nm — ABNORMAL LOW (ref >20.5)
LDLC SERPL CALC-MCNC: 30 mg/dL (ref <100)
LP-IR Score: 65 — ABNORMAL HIGH (ref <=45)
Small LDL Particle Number: 324 nmol/L (ref <=527)
Triglycerides by NMR: 132 mg/dL (ref <150)

## 2013-03-15 LAB — BMP8+EGFR
BUN/Creatinine Ratio: 20 (ref 11–26)
BUN: 12 mg/dL (ref 8–27)
CO2: 26 mmol/L (ref 18–29)
Calcium: 9.7 mg/dL (ref 8.7–10.3)
Chloride: 102 mmol/L (ref 97–108)
Creatinine, Ser: 0.61 mg/dL (ref 0.57–1.00)
GFR calc Af Amer: 110 mL/min/{1.73_m2} (ref >59)
GFR calc non Af Amer: 95 mL/min/{1.73_m2} (ref >59)
Glucose: 142 mg/dL — ABNORMAL HIGH (ref 65–99)
Potassium: 4.2 mmol/L (ref 3.5–5.2)
Sodium: 145 mmol/L — ABNORMAL HIGH (ref 134–144)

## 2013-03-15 LAB — VITAMIN D 25 HYDROXY (VIT D DEFICIENCY, FRACTURES): Vit D, 25-Hydroxy: 42.8 ng/mL (ref 30.0–100.0)

## 2013-03-18 ENCOUNTER — Other Ambulatory Visit: Payer: Self-pay | Admitting: *Deleted

## 2013-03-18 ENCOUNTER — Encounter: Payer: Self-pay | Admitting: *Deleted

## 2013-03-18 DIAGNOSIS — I4891 Unspecified atrial fibrillation: Secondary | ICD-10-CM

## 2013-03-18 NOTE — Telephone Encounter (Signed)
Discussed with Dr Burt Knack  Will hold Xarelto the pm prior to cath.  Patient aware

## 2013-03-18 NOTE — Telephone Encounter (Signed)
Left message for patient that left heart cath is sch for 2/2 with Dr Burt Knack.  She will need to be at the hospital at 5:30am, will draw labs the morning of  NPO after MN and No medications the am of cath  HOLD Metformin 24 hour before and 48 hours after  Patient to call me back to discuss

## 2013-03-24 ENCOUNTER — Encounter (HOSPITAL_COMMUNITY)
Admission: RE | Disposition: A | Discharge status: Home / Self Care | Payer: Self-pay | Source: Ambulatory Visit | Attending: Cardiovascular Disease

## 2013-03-24 ENCOUNTER — Ambulatory Visit (HOSPITAL_COMMUNITY)
Admission: RE | Admit: 2013-03-24 | Discharge: 2013-03-24 | Disposition: A | Discharge status: Home / Self Care | Payer: Medicare Other | Source: Ambulatory Visit | Attending: Cardiovascular Disease | Admitting: Cardiovascular Disease

## 2013-03-24 DIAGNOSIS — R002 Palpitations: Secondary | ICD-10-CM | POA: Diagnosis not present

## 2013-03-24 DIAGNOSIS — R7309 Other abnormal glucose: Secondary | ICD-10-CM | POA: Insufficient documentation

## 2013-03-24 DIAGNOSIS — R0602 Shortness of breath: Secondary | ICD-10-CM | POA: Diagnosis not present

## 2013-03-24 DIAGNOSIS — Z950 Presence of cardiac pacemaker: Secondary | ICD-10-CM | POA: Diagnosis not present

## 2013-03-24 DIAGNOSIS — I4891 Unspecified atrial fibrillation: Secondary | ICD-10-CM | POA: Diagnosis not present

## 2013-03-24 DIAGNOSIS — I10 Essential (primary) hypertension: Secondary | ICD-10-CM | POA: Diagnosis not present

## 2013-03-24 DIAGNOSIS — R5381 Other malaise: Secondary | ICD-10-CM | POA: Insufficient documentation

## 2013-03-24 DIAGNOSIS — R5383 Other fatigue: Secondary | ICD-10-CM

## 2013-03-24 DIAGNOSIS — Z8249 Family history of ischemic heart disease and other diseases of the circulatory system: Secondary | ICD-10-CM | POA: Diagnosis not present

## 2013-03-24 DIAGNOSIS — K219 Gastro-esophageal reflux disease without esophagitis: Secondary | ICD-10-CM | POA: Insufficient documentation

## 2013-03-24 DIAGNOSIS — F411 Generalized anxiety disorder: Secondary | ICD-10-CM | POA: Insufficient documentation

## 2013-03-24 DIAGNOSIS — Z87898 Personal history of other specified conditions: Secondary | ICD-10-CM | POA: Diagnosis not present

## 2013-03-24 HISTORY — PX: LEFT HEART CATHETERIZATION WITH CORONARY ANGIOGRAM: SHX5451

## 2013-03-24 LAB — BASIC METABOLIC PANEL
BUN: 18 mg/dL (ref 6–23)
CO2: 26 mEq/L (ref 19–32)
Calcium: 10.2 mg/dL (ref 8.4–10.5)
Chloride: 102 mEq/L (ref 96–112)
Creatinine, Ser: 0.82 mg/dL (ref 0.50–1.10)
GFR calc Af Amer: 85 mL/min — ABNORMAL LOW (ref >90)
GFR calc non Af Amer: 73 mL/min — ABNORMAL LOW (ref >90)
Glucose, Bld: 154 mg/dL — ABNORMAL HIGH (ref 70–99)
Potassium: 4.2 mEq/L (ref 3.7–5.3)
Sodium: 144 mEq/L (ref 137–147)

## 2013-03-24 LAB — CBC
HCT: 42.2 % (ref 36.0–46.0)
Hemoglobin: 14.5 g/dL (ref 12.0–15.0)
MCH: 31 pg (ref 26.0–34.0)
MCHC: 34.4 g/dL (ref 30.0–36.0)
MCV: 90.4 fL (ref 78.0–100.0)
Platelets: 242 10*3/uL (ref 150–400)
RBC: 4.67 MIL/uL (ref 3.87–5.11)
RDW: 14.1 % (ref 11.5–15.5)
WBC: 9 10*3/uL (ref 4.0–10.5)

## 2013-03-24 LAB — PROTIME-INR
INR: 1.03 (ref 0.00–1.49)
Prothrombin Time: 13.3 seconds (ref 11.6–15.2)

## 2013-03-24 LAB — GLUCOSE, CAPILLARY: Glucose-Capillary: 125 mg/dL — ABNORMAL HIGH (ref 70–99)

## 2013-03-24 SURGERY — LEFT HEART CATHETERIZATION WITH CORONARY ANGIOGRAM
Anesthesia: LOCAL

## 2013-03-24 MED ORDER — SODIUM CHLORIDE 0.9 % IV SOLN
INTRAVENOUS | Status: DC
  Administered 2013-03-24: 07:00:00 via INTRAVENOUS

## 2013-03-24 MED ORDER — ASPIRIN 81 MG PO CHEW
81.0000 mg | CHEWABLE_TABLET | ORAL | Status: AC
  Administered 2013-03-24: 81 mg via ORAL
  Filled 2013-03-24: qty 1

## 2013-03-24 MED ORDER — LIDOCAINE HCL (PF) 1 % IJ SOLN
INTRAMUSCULAR | Status: AC
  Filled 2013-03-24: qty 30

## 2013-03-24 MED ORDER — VERAPAMIL HCL 2.5 MG/ML IV SOLN
INTRAVENOUS | Status: AC
  Filled 2013-03-24: qty 2

## 2013-03-24 MED ORDER — NITROGLYCERIN 0.2 MG/ML ON CALL CATH LAB
INTRAVENOUS | Status: AC
  Filled 2013-03-24: qty 1

## 2013-03-24 MED ORDER — SODIUM CHLORIDE 0.9 % IJ SOLN: 3.0000 mL | Freq: Two times a day (BID) | INTRAMUSCULAR | Status: DC

## 2013-03-24 MED ORDER — SODIUM CHLORIDE 0.9 % IJ SOLN: 3.0000 mL | INTRAMUSCULAR | Status: DC | PRN

## 2013-03-24 MED ORDER — FENTANYL CITRATE 0.05 MG/ML IJ SOLN
INTRAMUSCULAR | Status: AC
  Filled 2013-03-24: qty 2

## 2013-03-24 MED ORDER — MIDAZOLAM HCL 2 MG/2ML IJ SOLN
INTRAMUSCULAR | Status: AC
  Filled 2013-03-24: qty 2

## 2013-03-24 MED ORDER — SODIUM CHLORIDE 0.9 % IV SOLN: 1.0000 mL/kg/h | INTRAVENOUS | Status: DC

## 2013-03-24 MED ORDER — ACETAMINOPHEN 325 MG PO TABS
650.0000 mg | ORAL_TABLET | ORAL | Status: DC | PRN
  Administered 2013-03-24: 650 mg via ORAL
  Filled 2013-03-24: qty 2

## 2013-03-24 MED ORDER — SODIUM CHLORIDE 0.9 % IV SOLN: 250.0000 mL | INTRAVENOUS | Status: DC | PRN

## 2013-03-24 MED ORDER — HEPARIN (PORCINE) IN NACL 2-0.9 UNIT/ML-% IJ SOLN
INTRAMUSCULAR | Status: AC
  Filled 2013-03-24: qty 1000

## 2013-03-24 MED ORDER — ONDANSETRON HCL 4 MG/2ML IJ SOLN: 4.0000 mg | Freq: Four times a day (QID) | INTRAMUSCULAR | Status: DC | PRN

## 2013-03-24 NOTE — Discharge Instructions (Signed)

## 2013-03-24 NOTE — Interval H&P Note (Signed)
History and Physical Interval Note:  03/24/2013 7:51 AM  [name]  has presented today for surgery, with the diagnosis of Afib  The various methods of treatment have been discussed with the patient and family. After consideration of risks, benefits and other options for treatment, the patient has consented to  Procedure(s): LEFT HEART CATHETERIZATION WITH CORONARY ANGIOGRAM (N/A) as a surgical intervention .  The patient's history has been reviewed, patient examined, no change in status, stable for surgery.  I have reviewed the patient's chart and labs.  Questions were answered to the patient's satisfaction.    Cath Lab Visit (complete for each Cath Lab visit)  Clinical Evaluation Leading to the Procedure:   ACS: no  Non-ACS:    Anginal Classification: No Symptoms  Anti-ischemic medical therapy: Minimal Therapy (1 class of medications)  Non-Invasive Test Results: No non-invasive testing performed  Prior CABG: No previous CABG       Sherren Mocha

## 2013-03-24 NOTE — CV Procedure (Signed)
    Cardiac Catheterization Procedure Note  Name: [name] MRN: [id number] DOB: Apr 17, 1945  Procedure: Left Heart Cath, Selective Coronary Angiography, LV angiography  Indication: Atrial fibrillation, preoperative coronary assessment, strong family hx of CAD, shortness of breath.   Procedural Details: The right wrist was prepped, draped, and anesthetized with 1% lidocaine. Using the modified Seldinger technique, a 5 French sheath was introduced into the right radial artery. 3 mg of verapamil was administered through the sheath, weight-based unfractionated heparin was administered intravenously. Standard Judkins catheters were used for selective coronary angiography and left ventriculography. Catheter exchanges were performed over an exchange length guidewire. There were no immediate procedural complications. A TR band was used for radial hemostasis at the completion of the procedure.  The patient was transferred to the post catheterization recovery area for further monitoring.  Procedural Findings: Hemodynamics: AO 124/73 LV 123/6  Coronary angiography: Coronary dominance: right  Left mainstem: Widely patent, arises from the left cusp, no obstructive disease. The left main is short and divides into the LAD and left circumflex.  Left anterior descending (LAD): The LAD is normal in caliber. The first diagonal branches into twin vessels. The LAD reaches the left ventricular apex. The vessel has no obstructive disease throughout its distribution.  Left circumflex (LCx): The left circumflex is large in caliber. There is a large first obtuse marginal branch. The second OM is small. The AV circumflex is small. The vessel is widely patent throughout.  Right coronary artery (RCA): Normal caliber, dominant vessel. The PDA and PLA branches are patent. The acute marginal branch is small. There is no obstructive disease throughout.  Left ventriculography: Left ventricular systolic function is  vigorous, LVEF is estimated at 65 to 70 %. Mitral regurgitation is present with PVC beats, I do not appreciate significant mitral regurgitation otherwise.  Final Conclusions:   1. Normal coronary arteries 2. Normal left ventricular systolic function with normal left ventricular end-diastolic pressure  Recommendations: the patient can resume Xarelto tonight. Follow-up as planned.  Sherren Mocha 03/24/2013, 8:21 AM

## 2013-03-24 NOTE — H&P (Signed)
History and Physical  Patient ID: [name] MRN: [id number], SOB: 05/19/1945 67 y.o. Date of Encounter: 03/24/2013, 7:43 AM  Primary Physician: Redge Gainer, MD Primary Cardiologist: Dr Rayann Heman  Chief Complaint: palpitations, weakness  HPI: 68 y.o. female w/ PMHx significant for atrial fibrillation who presented to [facility] on 03/24/2013 for cardiac catheterization.   The patient has had a lot of problems with atrial fibrillation. She has undergone 2 previous ablation procedures. She has been evaluated at [facility] and is scheduled for another ablation in a few weeks. Cardiac catheterization has been recommended before proceeding with further ablation. This is in the setting of the patient's strong family history of coronary artery disease and hypertension. She complains of shortness of breath, palpitations, and generalized fatigue. She has had no exertional chest pain or pressure.   Past Medical History  Diagnosis Date  . Impaired glucose tolerance   . Psoriasis   . IBS (irritable bowel syndrome)     vs diarrhea vs abd. fullness   . Chronic anxiety   . Uterine prolapse   . Bronchial spasms   . Hypertension   . Esophageal stricture   . Elevated blood sugar   . Diverticulosis   . Rectocele, female   . History of bladder repair surgery   . Hx of vaginal hysterectomy   . Vaginal prolapse 1998  . Hypothyroidism   . Psoriasis   . Tachy-brady syndrome     a. 08/19/2008 s/p PPM: SJM 2110 Accent  . Complication of anesthesia     " I shake real bad "  . Family history of anesthesia complication     Daughter also shakes while waking up  . GERD (gastroesophageal reflux disease)   . H/O hiatal hernia   . Neuromuscular disorder     periferal neuropathy  . Anemia   . Paroxysmal atrial fibrillation     a. failed flecainide, tikosyn, amio;  b. 01/2012 s/p RFCA.  . H/O scarlet fever   . Pacemaker     St. Jude     Surgical History:  Past Surgical History  Procedure  Laterality Date  . Vaginal hysterectomy      prolapse   . Bladder suspension    . Rectocele repair    . Transthoracic echocardiogram  2008  . Tee without cardioversion  02/06/2012    Procedure: TRANSESOPHAGEAL ECHOCARDIOGRAM (TEE);  Surgeon: Thayer Headings, MD;  Location: [facility] ENDOSCOPY;  Service: Cardiovascular;  Laterality: N/A;  . Atrial fibrillation ablation  02/06/12    PVI by Dr Rayann Heman  . Insert / replace / remove pacemaker      SJM  . Atrial fibrillation ablation  07/04/2012    repeat PVI by Dr Rayann Heman  . Tee without cardioversion N/A 07/04/2012    Procedure: TRANSESOPHAGEAL ECHOCARDIOGRAM (TEE);  Surgeon: Lelon Perla, MD;  Location: [facility] ENDOSCOPY;  Service: Cardiovascular;  Laterality: N/A;     Home Meds: Prior to Admission medications   Medication Sig Start Date End Date Taking? Authorizing Provider  ARTIFICIAL TEAR OP Place 1 drop into both eyes 2 (two) times daily as needed. For dry eyes   Yes Historical Provider, MD  Calcium Citrate-Vitamin D (CITRACAL PETITES/VITAMIN D PO) Take 2 tablets by mouth daily.   Yes Historical Provider, MD  carvedilol (COREG) 12.5 MG tablet TAKE 1 TABLET BY MOUTH EVERY MORNING AND 1& 1/2 TABLETS EVERY NIGHT 03/03/13  Yes Thompson Grayer, MD  clobetasol cream (TEMOVATE) 3.23 % Apply 1 application  topically daily as needed (for psoriasis).  12/25/11  Yes Historical Provider, MD  flecainide (TAMBOCOR) 50 MG tablet Take 1 tablet (50 mg total) by mouth 2 (two) times daily. 10/17/12  Yes Thompson Grayer, MD  levothyroxine (SYNTHROID, LEVOTHROID) 88 MCG tablet TAKE 1 TABLET BY MOUTH DAILY BEFORE BREAKFAST 02/21/13  Yes Chipper Herb, MD  LORazepam (ATIVAN) 0.5 MG tablet TAKE 1 TABLET BY MOUTH THREE TIMES DAILY 01/13/13  Yes Chipper Herb, MD  LUTEIN PO Take 1 capsule by mouth 2 (two) times daily.   Yes Historical Provider, MD  metFORMIN (GLUCOPHAGE-XR) 500 MG 24 hr tablet TAKE 1 TABLET BY MOUTH DAILY WITH BREAKFAST 01/12/13  Yes Chipper Herb, MD  methotrexate  (RHEUMATREX) 2.5 MG tablet Take 10 mg by mouth once a week. Caution:Chemotherapy. Protect from light.   Yes Historical Provider, MD  omeprazole (PRILOSEC) 40 MG capsule TAKE 1 CAPSULE BY MOUTH TWICE DAILY 01/20/13  Yes Sable Feil, MD  PARoxetine (PAXIL) 10 MG tablet TAKE 1 TABLET BY MOUTH EVERY EVENING 03/03/13  Yes Chipper Herb, MD  PHOSPHATIDYLSERINE PO Take 1 tablet by mouth daily.   Yes Historical Provider, MD  rosuvastatin (CRESTOR) 10 MG tablet Take 10 mg by mouth every evening.    Yes Historical Provider, MD  torsemide (DEMADEX) 20 MG tablet Take 1 tablet (20 mg total) by mouth daily. 10/31/12  Yes Chipper Herb, MD  XARELTO 20 MG TABS tablet TAKE ONE TABLET BY MOUTH EVERY DAY 01/02/13  Yes Thompson Grayer, MD  fluticasone (FLONASE) 50 MCG/ACT nasal spray Place 2 sprays into the nose as needed. 06/27/12   Chipper Herb, MD  Linaclotide [facility]) 145 MCG CAPS capsule Take 145 mcg by mouth as needed. 06/28/12   Sable Feil, MD  traMADol (ULTRAM) 50 MG tablet Take 1 tablet (50 mg total) by mouth every 8 (eight) hours as needed for pain. 06/28/12   Sable Feil, MD    Allergies:  Allergies  Allergen Reactions  . Latex Rash  . Caudal Tray [Lidocaine-Epinephrine]     Edema    . Ciprofloxacin Hcl Nausea Only  . Codeine Nausea Only  . Diovan [Valsartan] Itching  . Doxycycline Other (See Comments)    Might have caused heart to race per pt - not sure  . Esomeprazole Magnesium     Headache    . Levofloxacin     Insomnia    . Neomycin-Bacitracin Zn-Polymyx     rash  . Penicillins     hives  . Sulfa Drugs Cross Reactors Other (See Comments)    Pt not sure what reaction was  . Verapamil     Edema   . Vimovo [Naproxen-Esomeprazole]     Upset stomach   . Zocor [Simvastatin]     Edema, and myalagias     History   Social History  . Marital Status: Married    Spouse Name: N/A    Number of Children: 2  . Years of Education: N/A   Occupational History  .  ADMINISTRATION    Social History Main Topics  . Smoking status: Never Smoker   . Smokeless tobacco: Never Used  . Alcohol Use: No  . Drug Use: No  . Sexual Activity: Yes   Other Topics Concern  . Not on file   Social History Narrative   Lives in [place].  Works as a Research scientist (physical sciences)     Family History  Problem Relation Age of Onset  . Breast cancer Sister   .  Ovarian cancer Sister   . Uterine cancer Paternal Aunt   . Crohn's disease Other     neice  . Diabetes Mother   . Heart disease Mother   . Diabetes Maternal Grandmother   . Diabetes Sister   . Heart disease Father   . Heart disease Brother   . Heart disease Brother   . Colon cancer Neg Hx   . Stomach cancer Neg Hx     Review of Systems: General: negative for chills, fever, night sweats or weight changes.  ENT: negative for rhinorrhea or epistaxis Cardiovascular: See history of present illness Respiratory: negative for cough or wheezing GI: negative for nausea, vomiting, diarrhea, bright red blood per rectum, melena, or hematemesis GU: no hematuria, urgency, or frequency Neurologic: Positive for peripheral neuropathy with lower extremity burning and pain Heme: no easy bruising or bleeding Endo: negative for excessive thirst, thyroid disorder, or flushing Musculoskeletal: Positive for low back pain and flank pain All other systems reviewed and are otherwise negative except as noted above.  Physical Exam: Blood pressure 168/68, pulse 63, temperature 97.9 F (36.6 C), temperature source Oral, resp. rate 17, height 5\' 4"  (1.626 m), weight 168 lb (76.204 kg), SpO2 97.00%. General: Well developed, well nourished, alert and oriented, in no acute distress. Psych:  Responds to questions appropriately with a normal affect.   Labs:   Lab Results  Component Value Date   WBC 9.0 03/24/2013   HGB 14.5 03/24/2013   HCT 42.2 03/24/2013   MCV 90.4 03/24/2013   PLT 242 03/24/2013    Recent Labs Lab 03/24/13 0620  NA 144  K  4.2  CL 102  CO2 26  BUN 18  CREATININE 0.82  CALCIUM 10.2  GLUCOSE 154*   No results found for this basename: CKTOTAL, CKMB, TROPONINI,  in the last 72 hours Lab Results  Component Value Date   CHOL 93 03/13/2013   HDL 42 04/30/2008   LDLCALC 34 06/27/2012   TRIG 146 06/27/2012   No results found for this basename: DDIMER    Radiology/Studies:  No results found.  ASSESSMENT AND PLAN:  68 year old woman with refractory atrial fibrillation. She is going for a Convergent procedure for treatment of her atrial fib. She presents now for cardiac catheterization to define her coronary anatomy prior to that procedure. Risks, indications, and alternatives to cardiac catheterization and possible PCI were reviewed with the patient.  Deatra James  03/24/2013, 7:43 AM

## 2013-04-03 DIAGNOSIS — H353190 Nonexudative age-related macular degeneration, unspecified eye, stage unspecified: Secondary | ICD-10-CM | POA: Diagnosis not present

## 2013-04-03 DIAGNOSIS — H52229 Regular astigmatism, unspecified eye: Secondary | ICD-10-CM | POA: Diagnosis not present

## 2013-04-03 DIAGNOSIS — H2510 Age-related nuclear cataract, unspecified eye: Secondary | ICD-10-CM | POA: Diagnosis not present

## 2013-04-03 DIAGNOSIS — H5210 Myopia, unspecified eye: Secondary | ICD-10-CM | POA: Diagnosis not present

## 2013-04-04 ENCOUNTER — Other Ambulatory Visit: Payer: Self-pay | Admitting: Internal Medicine

## 2013-04-08 ENCOUNTER — Other Ambulatory Visit: Payer: Self-pay | Admitting: Family Medicine

## 2013-04-14 DIAGNOSIS — Z950 Presence of cardiac pacemaker: Secondary | ICD-10-CM | POA: Diagnosis not present

## 2013-04-14 DIAGNOSIS — Z882 Allergy status to sulfonamides status: Secondary | ICD-10-CM | POA: Diagnosis not present

## 2013-04-14 DIAGNOSIS — E039 Hypothyroidism, unspecified: Secondary | ICD-10-CM | POA: Diagnosis not present

## 2013-04-14 DIAGNOSIS — Z880 Allergy status to penicillin: Secondary | ICD-10-CM | POA: Diagnosis not present

## 2013-04-14 DIAGNOSIS — I69959 Hemiplegia and hemiparesis following unspecified cerebrovascular disease affecting unspecified side: Secondary | ICD-10-CM | POA: Diagnosis not present

## 2013-04-14 DIAGNOSIS — R471 Dysarthria and anarthria: Secondary | ICD-10-CM | POA: Diagnosis not present

## 2013-04-14 DIAGNOSIS — G936 Cerebral edema: Secondary | ICD-10-CM | POA: Diagnosis not present

## 2013-04-14 DIAGNOSIS — Z91040 Latex allergy status: Secondary | ICD-10-CM | POA: Diagnosis not present

## 2013-04-14 DIAGNOSIS — Z452 Encounter for adjustment and management of vascular access device: Secondary | ICD-10-CM | POA: Diagnosis not present

## 2013-04-14 DIAGNOSIS — Z01818 Encounter for other preprocedural examination: Secondary | ICD-10-CM | POA: Diagnosis not present

## 2013-04-14 DIAGNOSIS — G8929 Other chronic pain: Secondary | ICD-10-CM | POA: Diagnosis present

## 2013-04-14 DIAGNOSIS — I459 Conduction disorder, unspecified: Secondary | ICD-10-CM | POA: Diagnosis not present

## 2013-04-14 DIAGNOSIS — D649 Anemia, unspecified: Secondary | ICD-10-CM | POA: Diagnosis present

## 2013-04-14 DIAGNOSIS — I6350 Cerebral infarction due to unspecified occlusion or stenosis of unspecified cerebral artery: Secondary | ICD-10-CM | POA: Diagnosis not present

## 2013-04-14 DIAGNOSIS — R4701 Aphasia: Secondary | ICD-10-CM | POA: Diagnosis not present

## 2013-04-14 DIAGNOSIS — E876 Hypokalemia: Secondary | ICD-10-CM | POA: Diagnosis not present

## 2013-04-14 DIAGNOSIS — K219 Gastro-esophageal reflux disease without esophagitis: Secondary | ICD-10-CM | POA: Diagnosis present

## 2013-04-14 DIAGNOSIS — I10 Essential (primary) hypertension: Secondary | ICD-10-CM | POA: Diagnosis not present

## 2013-04-14 DIAGNOSIS — R633 Feeding difficulties, unspecified: Secondary | ICD-10-CM | POA: Diagnosis not present

## 2013-04-14 DIAGNOSIS — L408 Other psoriasis: Secondary | ICD-10-CM | POA: Diagnosis present

## 2013-04-14 DIAGNOSIS — K589 Irritable bowel syndrome without diarrhea: Secondary | ICD-10-CM | POA: Diagnosis present

## 2013-04-14 DIAGNOSIS — J9383 Other pneumothorax: Secondary | ICD-10-CM | POA: Diagnosis not present

## 2013-04-14 DIAGNOSIS — K5900 Constipation, unspecified: Secondary | ICD-10-CM | POA: Diagnosis present

## 2013-04-14 DIAGNOSIS — I4510 Unspecified right bundle-branch block: Secondary | ICD-10-CM | POA: Diagnosis not present

## 2013-04-14 DIAGNOSIS — F411 Generalized anxiety disorder: Secondary | ICD-10-CM | POA: Diagnosis present

## 2013-04-14 DIAGNOSIS — I4891 Unspecified atrial fibrillation: Secondary | ICD-10-CM | POA: Diagnosis not present

## 2013-04-14 DIAGNOSIS — Z9889 Other specified postprocedural states: Secondary | ICD-10-CM | POA: Diagnosis not present

## 2013-04-14 DIAGNOSIS — R9431 Abnormal electrocardiogram [ECG] [EKG]: Secondary | ICD-10-CM | POA: Diagnosis not present

## 2013-04-14 DIAGNOSIS — Z4682 Encounter for fitting and adjustment of non-vascular catheter: Secondary | ICD-10-CM | POA: Diagnosis not present

## 2013-04-14 DIAGNOSIS — J90 Pleural effusion, not elsewhere classified: Secondary | ICD-10-CM | POA: Diagnosis not present

## 2013-04-14 DIAGNOSIS — I658 Occlusion and stenosis of other precerebral arteries: Secondary | ICD-10-CM | POA: Diagnosis present

## 2013-04-14 DIAGNOSIS — E785 Hyperlipidemia, unspecified: Secondary | ICD-10-CM | POA: Diagnosis present

## 2013-04-14 DIAGNOSIS — G8190 Hemiplegia, unspecified affecting unspecified side: Secondary | ICD-10-CM | POA: Diagnosis present

## 2013-04-14 DIAGNOSIS — M6281 Muscle weakness (generalized): Secondary | ICD-10-CM | POA: Diagnosis not present

## 2013-04-14 DIAGNOSIS — G609 Hereditary and idiopathic neuropathy, unspecified: Secondary | ICD-10-CM | POA: Diagnosis present

## 2013-04-14 DIAGNOSIS — I499 Cardiac arrhythmia, unspecified: Secondary | ICD-10-CM | POA: Diagnosis not present

## 2013-04-14 DIAGNOSIS — K592 Neurogenic bowel, not elsewhere classified: Secondary | ICD-10-CM | POA: Diagnosis not present

## 2013-04-14 DIAGNOSIS — E119 Type 2 diabetes mellitus without complications: Secondary | ICD-10-CM | POA: Diagnosis not present

## 2013-04-14 DIAGNOSIS — Z01810 Encounter for preprocedural cardiovascular examination: Secondary | ICD-10-CM | POA: Diagnosis not present

## 2013-04-14 DIAGNOSIS — I6529 Occlusion and stenosis of unspecified carotid artery: Secondary | ICD-10-CM | POA: Diagnosis present

## 2013-04-14 DIAGNOSIS — M549 Dorsalgia, unspecified: Secondary | ICD-10-CM | POA: Diagnosis present

## 2013-04-14 DIAGNOSIS — J984 Other disorders of lung: Secondary | ICD-10-CM | POA: Diagnosis not present

## 2013-04-14 DIAGNOSIS — Z431 Encounter for attention to gastrostomy: Secondary | ICD-10-CM | POA: Diagnosis not present

## 2013-04-14 DIAGNOSIS — I6340 Cerebral infarction due to embolism of unspecified cerebral artery: Secondary | ICD-10-CM | POA: Diagnosis not present

## 2013-04-14 DIAGNOSIS — R918 Other nonspecific abnormal finding of lung field: Secondary | ICD-10-CM | POA: Diagnosis not present

## 2013-04-14 DIAGNOSIS — R1310 Dysphagia, unspecified: Secondary | ICD-10-CM | POA: Diagnosis not present

## 2013-04-14 DIAGNOSIS — Z5189 Encounter for other specified aftercare: Secondary | ICD-10-CM | POA: Diagnosis not present

## 2013-04-14 DIAGNOSIS — I4892 Unspecified atrial flutter: Secondary | ICD-10-CM | POA: Diagnosis not present

## 2013-04-14 DIAGNOSIS — I517 Cardiomegaly: Secondary | ICD-10-CM | POA: Diagnosis not present

## 2013-04-15 ENCOUNTER — Other Ambulatory Visit: Payer: Self-pay | Admitting: Family Medicine

## 2013-04-26 DIAGNOSIS — R279 Unspecified lack of coordination: Secondary | ICD-10-CM | POA: Diagnosis not present

## 2013-04-26 DIAGNOSIS — R4182 Altered mental status, unspecified: Secondary | ICD-10-CM | POA: Diagnosis not present

## 2013-04-26 DIAGNOSIS — K5289 Other specified noninfective gastroenteritis and colitis: Secondary | ICD-10-CM | POA: Diagnosis not present

## 2013-04-26 DIAGNOSIS — I69959 Hemiplegia and hemiparesis following unspecified cerebrovascular disease affecting unspecified side: Secondary | ICD-10-CM | POA: Diagnosis not present

## 2013-04-26 DIAGNOSIS — I10 Essential (primary) hypertension: Secondary | ICD-10-CM | POA: Diagnosis present

## 2013-04-26 DIAGNOSIS — E878 Other disorders of electrolyte and fluid balance, not elsewhere classified: Secondary | ICD-10-CM | POA: Diagnosis not present

## 2013-04-26 DIAGNOSIS — Z931 Gastrostomy status: Secondary | ICD-10-CM | POA: Diagnosis not present

## 2013-04-26 DIAGNOSIS — IMO0001 Reserved for inherently not codable concepts without codable children: Secondary | ICD-10-CM | POA: Diagnosis not present

## 2013-04-26 DIAGNOSIS — K589 Irritable bowel syndrome without diarrhea: Secondary | ICD-10-CM | POA: Diagnosis not present

## 2013-04-26 DIAGNOSIS — G609 Hereditary and idiopathic neuropathy, unspecified: Secondary | ICD-10-CM | POA: Diagnosis present

## 2013-04-26 DIAGNOSIS — L408 Other psoriasis: Secondary | ICD-10-CM | POA: Diagnosis present

## 2013-04-26 DIAGNOSIS — N319 Neuromuscular dysfunction of bladder, unspecified: Secondary | ICD-10-CM | POA: Diagnosis present

## 2013-04-26 DIAGNOSIS — E785 Hyperlipidemia, unspecified: Secondary | ICD-10-CM | POA: Diagnosis not present

## 2013-04-26 DIAGNOSIS — Z950 Presence of cardiac pacemaker: Secondary | ICD-10-CM | POA: Diagnosis not present

## 2013-04-26 DIAGNOSIS — E876 Hypokalemia: Secondary | ICD-10-CM | POA: Diagnosis not present

## 2013-04-26 DIAGNOSIS — I69922 Dysarthria following unspecified cerebrovascular disease: Secondary | ICD-10-CM | POA: Diagnosis not present

## 2013-04-26 DIAGNOSIS — I6350 Cerebral infarction due to unspecified occlusion or stenosis of unspecified cerebral artery: Secondary | ICD-10-CM | POA: Diagnosis not present

## 2013-04-26 DIAGNOSIS — I4891 Unspecified atrial fibrillation: Secondary | ICD-10-CM | POA: Diagnosis not present

## 2013-04-26 DIAGNOSIS — R4701 Aphasia: Secondary | ICD-10-CM | POA: Diagnosis present

## 2013-04-26 DIAGNOSIS — F411 Generalized anxiety disorder: Secondary | ICD-10-CM | POA: Diagnosis present

## 2013-04-26 DIAGNOSIS — A09 Infectious gastroenteritis and colitis, unspecified: Secondary | ICD-10-CM | POA: Diagnosis not present

## 2013-04-26 DIAGNOSIS — Z5189 Encounter for other specified aftercare: Secondary | ICD-10-CM | POA: Diagnosis not present

## 2013-04-26 DIAGNOSIS — R109 Unspecified abdominal pain: Secondary | ICD-10-CM | POA: Diagnosis not present

## 2013-04-26 DIAGNOSIS — I459 Conduction disorder, unspecified: Secondary | ICD-10-CM | POA: Diagnosis not present

## 2013-04-26 DIAGNOSIS — K5730 Diverticulosis of large intestine without perforation or abscess without bleeding: Secondary | ICD-10-CM | POA: Diagnosis present

## 2013-04-26 DIAGNOSIS — R1310 Dysphagia, unspecified: Secondary | ICD-10-CM | POA: Diagnosis present

## 2013-04-26 DIAGNOSIS — R404 Transient alteration of awareness: Secondary | ICD-10-CM | POA: Diagnosis not present

## 2013-04-26 DIAGNOSIS — K219 Gastro-esophageal reflux disease without esophagitis: Secondary | ICD-10-CM | POA: Diagnosis present

## 2013-04-26 DIAGNOSIS — R29810 Facial weakness: Secondary | ICD-10-CM | POA: Diagnosis present

## 2013-04-26 DIAGNOSIS — E039 Hypothyroidism, unspecified: Secondary | ICD-10-CM | POA: Diagnosis not present

## 2013-04-26 DIAGNOSIS — K5732 Diverticulitis of large intestine without perforation or abscess without bleeding: Secondary | ICD-10-CM | POA: Diagnosis not present

## 2013-04-26 DIAGNOSIS — R0602 Shortness of breath: Secondary | ICD-10-CM | POA: Diagnosis not present

## 2013-04-26 DIAGNOSIS — E669 Obesity, unspecified: Secondary | ICD-10-CM | POA: Diagnosis not present

## 2013-04-26 DIAGNOSIS — I6789 Other cerebrovascular disease: Secondary | ICD-10-CM | POA: Diagnosis not present

## 2013-04-26 DIAGNOSIS — R471 Dysarthria and anarthria: Secondary | ICD-10-CM | POA: Diagnosis present

## 2013-04-26 DIAGNOSIS — K592 Neurogenic bowel, not elsewhere classified: Secondary | ICD-10-CM | POA: Diagnosis present

## 2013-04-26 DIAGNOSIS — E119 Type 2 diabetes mellitus without complications: Secondary | ICD-10-CM | POA: Diagnosis not present

## 2013-05-12 ENCOUNTER — Encounter: Payer: Medicare Other | Admitting: Internal Medicine

## 2013-05-23 DIAGNOSIS — I10 Essential (primary) hypertension: Secondary | ICD-10-CM | POA: Diagnosis not present

## 2013-05-23 DIAGNOSIS — R509 Fever, unspecified: Secondary | ICD-10-CM | POA: Diagnosis not present

## 2013-05-23 DIAGNOSIS — R4701 Aphasia: Secondary | ICD-10-CM | POA: Diagnosis not present

## 2013-05-23 DIAGNOSIS — I495 Sick sinus syndrome: Secondary | ICD-10-CM | POA: Diagnosis not present

## 2013-05-23 DIAGNOSIS — R109 Unspecified abdominal pain: Secondary | ICD-10-CM | POA: Diagnosis not present

## 2013-05-23 DIAGNOSIS — I4891 Unspecified atrial fibrillation: Secondary | ICD-10-CM | POA: Diagnosis not present

## 2013-05-23 DIAGNOSIS — R1084 Generalized abdominal pain: Secondary | ICD-10-CM | POA: Diagnosis not present

## 2013-05-23 DIAGNOSIS — E119 Type 2 diabetes mellitus without complications: Secondary | ICD-10-CM | POA: Diagnosis not present

## 2013-05-23 DIAGNOSIS — I69969 Other paralytic syndrome following unspecified cerebrovascular disease affecting unspecified side: Secondary | ICD-10-CM | POA: Diagnosis not present

## 2013-05-23 DIAGNOSIS — R569 Unspecified convulsions: Secondary | ICD-10-CM | POA: Diagnosis not present

## 2013-05-23 DIAGNOSIS — E1149 Type 2 diabetes mellitus with other diabetic neurological complication: Secondary | ICD-10-CM | POA: Diagnosis not present

## 2013-05-23 DIAGNOSIS — I69959 Hemiplegia and hemiparesis following unspecified cerebrovascular disease affecting unspecified side: Secondary | ICD-10-CM | POA: Diagnosis not present

## 2013-05-23 DIAGNOSIS — R9401 Abnormal electroencephalogram [EEG]: Secondary | ICD-10-CM | POA: Diagnosis not present

## 2013-05-23 DIAGNOSIS — E039 Hypothyroidism, unspecified: Secondary | ICD-10-CM | POA: Diagnosis not present

## 2013-05-23 DIAGNOSIS — I6789 Other cerebrovascular disease: Secondary | ICD-10-CM | POA: Diagnosis not present

## 2013-05-23 DIAGNOSIS — R1310 Dysphagia, unspecified: Secondary | ICD-10-CM | POA: Diagnosis not present

## 2013-05-23 DIAGNOSIS — R269 Unspecified abnormalities of gait and mobility: Secondary | ICD-10-CM | POA: Diagnosis not present

## 2013-05-23 DIAGNOSIS — N390 Urinary tract infection, site not specified: Secondary | ICD-10-CM | POA: Diagnosis not present

## 2013-05-23 DIAGNOSIS — Z950 Presence of cardiac pacemaker: Secondary | ICD-10-CM | POA: Diagnosis not present

## 2013-05-23 DIAGNOSIS — R52 Pain, unspecified: Secondary | ICD-10-CM | POA: Diagnosis not present

## 2013-05-23 DIAGNOSIS — M109 Gout, unspecified: Secondary | ICD-10-CM | POA: Diagnosis not present

## 2013-05-23 DIAGNOSIS — I6350 Cerebral infarction due to unspecified occlusion or stenosis of unspecified cerebral artery: Secondary | ICD-10-CM | POA: Diagnosis not present

## 2013-05-23 DIAGNOSIS — E785 Hyperlipidemia, unspecified: Secondary | ICD-10-CM | POA: Diagnosis not present

## 2013-05-23 DIAGNOSIS — IMO0001 Reserved for inherently not codable concepts without codable children: Secondary | ICD-10-CM | POA: Diagnosis not present

## 2013-05-23 DIAGNOSIS — I69922 Dysarthria following unspecified cerebrovascular disease: Secondary | ICD-10-CM | POA: Diagnosis not present

## 2013-05-23 DIAGNOSIS — Z7401 Bed confinement status: Secondary | ICD-10-CM | POA: Diagnosis not present

## 2013-05-23 DIAGNOSIS — R404 Transient alteration of awareness: Secondary | ICD-10-CM | POA: Diagnosis not present

## 2013-05-23 DIAGNOSIS — R059 Cough, unspecified: Secondary | ICD-10-CM | POA: Diagnosis not present

## 2013-05-23 DIAGNOSIS — R05 Cough: Secondary | ICD-10-CM | POA: Diagnosis not present

## 2013-06-02 DIAGNOSIS — E119 Type 2 diabetes mellitus without complications: Secondary | ICD-10-CM | POA: Diagnosis not present

## 2013-06-02 DIAGNOSIS — I6789 Other cerebrovascular disease: Secondary | ICD-10-CM | POA: Diagnosis not present

## 2013-06-02 DIAGNOSIS — R1310 Dysphagia, unspecified: Secondary | ICD-10-CM | POA: Diagnosis not present

## 2013-06-02 DIAGNOSIS — R4701 Aphasia: Secondary | ICD-10-CM | POA: Diagnosis not present

## 2013-07-08 DIAGNOSIS — E119 Type 2 diabetes mellitus without complications: Secondary | ICD-10-CM | POA: Diagnosis not present

## 2013-07-08 DIAGNOSIS — R4701 Aphasia: Secondary | ICD-10-CM | POA: Diagnosis not present

## 2013-07-08 DIAGNOSIS — I6789 Other cerebrovascular disease: Secondary | ICD-10-CM | POA: Diagnosis not present

## 2013-07-08 DIAGNOSIS — R1310 Dysphagia, unspecified: Secondary | ICD-10-CM | POA: Diagnosis not present

## 2013-07-10 ENCOUNTER — Ambulatory Visit: Payer: Medicare Other | Admitting: Family Medicine

## 2013-07-15 ENCOUNTER — Telehealth: Payer: Self-pay | Admitting: *Deleted

## 2013-07-15 NOTE — Telephone Encounter (Signed)
Tried to call cell went straight to voicemail, then called home phone and left message for her to call me .  Dr Rayann Heman reviewed the xray report and wanted me to call her to see how she is feeling and what was going on.  It looks like she is was in a facility.

## 2013-07-22 ENCOUNTER — Encounter: Payer: Self-pay | Admitting: *Deleted

## 2013-07-22 ENCOUNTER — Encounter: Payer: Self-pay | Admitting: Internal Medicine

## 2013-07-22 ENCOUNTER — Other Ambulatory Visit: Payer: Self-pay | Admitting: *Deleted

## 2013-07-22 ENCOUNTER — Ambulatory Visit (INDEPENDENT_AMBULATORY_CARE_PROVIDER_SITE_OTHER): Payer: Medicare Other | Admitting: Cardiology

## 2013-07-22 VITALS — BP 99/70 | HR 73

## 2013-07-22 DIAGNOSIS — Z950 Presence of cardiac pacemaker: Secondary | ICD-10-CM | POA: Diagnosis not present

## 2013-07-22 DIAGNOSIS — I495 Sick sinus syndrome: Secondary | ICD-10-CM

## 2013-07-22 DIAGNOSIS — I480 Paroxysmal atrial fibrillation: Secondary | ICD-10-CM

## 2013-07-22 DIAGNOSIS — I4891 Unspecified atrial fibrillation: Secondary | ICD-10-CM

## 2013-07-22 LAB — MDC_IDC_ENUM_SESS_TYPE_INCLINIC
Battery Remaining Longevity: 129.6 mo
Battery Voltage: 2.96 V
Brady Statistic RA Percent Paced: 30 %
Brady Statistic RV Percent Paced: 10 %
Date Time Interrogation Session: 20150602175122
Implantable Pulse Generator Model: 2110
Implantable Pulse Generator Serial Number: 2285953
Lead Channel Impedance Value: 410 Ohm
Lead Channel Impedance Value: 430 Ohm
Lead Channel Pacing Threshold Amplitude: 0.75 V
Lead Channel Pacing Threshold Amplitude: 1 V
Lead Channel Pacing Threshold Pulse Width: 0.4 ms
Lead Channel Pacing Threshold Pulse Width: 0.8 ms
Lead Channel Sensing Intrinsic Amplitude: 4.6 mV
Lead Channel Sensing Intrinsic Amplitude: 5 mV
Lead Channel Setting Pacing Amplitude: 2 V
Lead Channel Setting Pacing Amplitude: 2.5 V
Lead Channel Setting Pacing Pulse Width: 0.8 ms
Lead Channel Setting Sensing Sensitivity: 2 mV

## 2013-07-22 NOTE — Progress Notes (Addendum)
ELECTROPHYSIOLOGY OFFICE NOTE   Patient ID: [name] MRN: [id number], DOB/AGE: February 13, 1946   Date of Visit: 07/22/2013  Primary Physician: Redge Gainer, MD Primary Cardiologist: Rayann Heman, MD Reason for Visit: EP/device follow-up  History of Present Illness  [name] is a 68 y.o. female with persistent atrial fibrillation who underwent convergent ablation procedure at [facility] in Feb 2015. This was complicated by acute L MCA CVA. She is in a rehab facility in [place], [place]. She is able to stand and walk with assistance and she is taking POs now. She is dysphasic. She presents today for routine electrophysiology followup. She is accompanied by her husband and daughter who assist with history questions. She denies chest pain or shortness of breath. She denies palpitations, dizziness, near syncope or syncope.   Past Medical History Past Medical History  Diagnosis Date  . Impaired glucose tolerance   . Psoriasis   . IBS (irritable bowel syndrome)     vs diarrhea vs abd. fullness   . Chronic anxiety   . Uterine prolapse   . Bronchial spasms   . Hypertension   . Esophageal stricture   . Elevated blood sugar   . Diverticulosis   . Rectocele, female   . History of bladder repair surgery   . Hx of vaginal hysterectomy   . Vaginal prolapse 1998  . Hypothyroidism   . Psoriasis   . Tachy-brady syndrome     a. 08/19/2008 s/p PPM: SJM 2110 Accent  . Complication of anesthesia     " I shake real bad "  . Family history of anesthesia complication     Daughter also shakes while waking up  . GERD (gastroesophageal reflux disease)   . H/O hiatal hernia   . Neuromuscular disorder     periferal neuropathy  . Anemia   . Paroxysmal atrial fibrillation     a. failed flecainide, tikosyn, amio;  b. 01/2012 s/p RFCA.  . H/O scarlet fever   . Pacemaker     St. Jude    Past Surgical History Past Surgical History  Procedure Laterality Date  . Vaginal hysterectomy      prolapse   . Bladder  suspension    . Rectocele repair    . Transthoracic echocardiogram  2008  . Tee without cardioversion  02/06/2012    Procedure: TRANSESOPHAGEAL ECHOCARDIOGRAM (TEE);  Surgeon: Thayer Headings, MD;  Location: [facility] ENDOSCOPY;  Service: Cardiovascular;  Laterality: N/A;  . Atrial fibrillation ablation  02/06/12    PVI by Dr Rayann Heman  . Insert / replace / remove pacemaker      SJM  . Atrial fibrillation ablation  07/04/2012    repeat PVI by Dr Rayann Heman  . Tee without cardioversion N/A 07/04/2012    Procedure: TRANSESOPHAGEAL ECHOCARDIOGRAM (TEE);  Surgeon: Lelon Perla, MD;  Location: [facility] ENDOSCOPY;  Service: Cardiovascular;  Laterality: N/A;    Allergies/Intolerances Allergies  Allergen Reactions  . Latex Rash  . Caudal Tray [Lidocaine-Epinephrine]     Edema    . Ciprofloxacin Hcl Nausea Only  . Codeine Nausea Only  . Diovan [Valsartan] Itching  . Doxycycline Other (See Comments)    Might have caused heart to race per pt - not sure  . Esomeprazole Magnesium     Headache    . Levofloxacin     Insomnia    . Neomycin-Bacitracin Zn-Polymyx     rash  . Penicillins     hives  . Sulfa Drugs Cross Reactors Other (See Comments)  Pt not sure what reaction was  . Verapamil     Edema   . Vimovo [Naproxen-Esomeprazole]     Upset stomach   . Zocor [Simvastatin]     Edema, and myalagias     Current Home Medications Current Outpatient Prescriptions  Medication Sig Dispense Refill  . ARTIFICIAL TEAR OP Place 1 drop into both eyes 2 (two) times daily as needed. For dry eyes      . aspirin 81 MG tablet Take 81 mg by mouth daily.      . Calcium Citrate-Vitamin D (CITRACAL PETITES/VITAMIN D PO) Take 2 tablets by mouth daily.      . clobetasol cream (TEMOVATE) 7.25 % Apply 1 application topically daily as needed (for psoriasis).       Marland Kitchen Fexofenadine HCl (ALLEGRA PO) Take 0.1 tablets by mouth daily.      . flecainide (TAMBOCOR) 50 MG tablet Take 1 tablet (50 mg total) by mouth 2 (two)  times daily.  60 tablet  11  . fluticasone (FLONASE) 50 MCG/ACT nasal spray Place 2 sprays into the nose as needed.      . lansoprazole (PREVACID) 30 MG capsule Take 30 mg by mouth daily at 12 noon.      . levETIRAcetam (KEPPRA) 250 MG tablet Take 250 mg by mouth 3 (three) times daily as needed.      Marland Kitchen levothyroxine (SYNTHROID, LEVOTHROID) 88 MCG tablet TAKE 1 TABLET BY MOUTH DAILY BEFORE BREAKFAST  30 tablet  8  . loperamide (IMODIUM) 2 MG capsule Take by mouth as needed for diarrhea or loose stools.      Marland Kitchen LORazepam (ATIVAN) 0.5 MG tablet TAKE 1 TABLET BY MOUTH THREE TIMES DAILY  90 tablet  1  . metFORMIN (GLUCOPHAGE-XR) 500 MG 24 hr tablet TAKE 1 TABLET BY MOUTH EVERY DAY WITH BREAKFAST  30 tablet  1  . methotrexate (RHEUMATREX) 2.5 MG tablet Take 10 mg by mouth once a week. Caution:Chemotherapy. Protect from light.      . metoprolol (LOPRESSOR) 50 MG tablet 1 1/2 tab bid      . mirtazapine (REMERON) 15 MG tablet 1/2 tab po qd      . potassium chloride SA (K-DUR,KLOR-CON) 20 MEQ tablet Take 40 mEq by mouth 2 (two) times daily.      . predniSONE (DELTASONE) 20 MG tablet Take 20 mg by mouth as needed. gout      . simvastatin (ZOCOR) 40 MG tablet Take 40 mg by mouth daily.      . traMADol (ULTRAM) 50 MG tablet Take 1 tablet (50 mg total) by mouth every 8 (eight) hours as needed for pain.  50 tablet  0  . XARELTO 20 MG TABS tablet TAKE ONE TABLET BY MOUTH EVERY DAY  30 tablet  6  . acetaminophen (TYLENOL) 160 MG/5ML solution 650 mg as needed.       No current facility-administered medications for this visit.    Social History History   Social History  . Marital Status: Married    Spouse Name: N/A    Number of Children: 2  . Years of Education: N/A   Occupational History  . ADMINISTRATION    Social History Main Topics  . Smoking status: Never Smoker   . Smokeless tobacco: Never Used  . Alcohol Use: No  . Drug Use: No  . Sexual Activity: Yes   Other Topics Concern  . Not on file    Social History Narrative   Lives in [place].  Works as a Research officer, political party: No chills, fever, night sweats or weight changes Cardiovascular: No chest pain, dyspnea on exertion, edema, orthopnea, palpitations, paroxysmal nocturnal dyspnea Dermatological: No rash, lesions or masses Respiratory: No cough, dyspnea Urologic: No hematuria, dysuria Abdominal: No nausea, vomiting, diarrhea, bright red blood per rectum, melena, or hematemesis Neurologic: No visual changes All other systems reviewed and are otherwise negative except as noted above.  Physical Exam Vitals: Blood pressure 99/70, pulse 73.  General: Well developed 68 y.o. female in no acute distress. She is examined on stretcher provided by EMS transport. HEENT: Normocephalic, atraumatic. EOMs intact. Sclera nonicteric. Oropharynx clear.  Neck: Supple. No JVD. Lungs: Respirations regular and unlabored, CTA bilaterally. No wheezes, rales or rhonchi. Heart: RRR. S1, S2 present. No murmurs, rub, S3 or S4. Abdomen: Soft, non-distended.  Extremities: No clubbing, cyanosis or edema. PT/Radials 2+ and equal bilaterally. Psych: Normal affect. Neuro: Alert and oriented X 3. Moves all extremities spontaneously.   Diagnostics Device interrogation today - Normal device function. Thresholds, sensing, impedances consistent with previous measurements. Device programmed to maximize longevity. 683 mode switch episodes, AFib burden 2.2% of time. No AFib documented after 05/23/2013. +Xarelto. 6 high ventricular rates noted, no EGMs available. Device programmed at appropriate safety margins. Histogram distribution appropriate for patient activity level. Device programmed to optimize intrinsic conduction. Estimated longevity 8.1 - 10.7 years. ROV with Dr. Rayann Heman in 3 months.  Assessment and Plan  1. Tachy-brady syndrome s/p PPM implant Normal device function No programming changes made Continue routine remote PPM  follow-up every 3 months  2. Persistent atrial fibrillation s/p convergent ablation procedure at [facility] Feb 4401, complicated by acute CVA AFib burden 2.2% of time No AFib documented after 05/23/2013 Continue flecainide and metoprolol Continue Xarelto and stop aspirin Return for follow-up with Dr. Rayann Heman in 3 months  Husband - Lennie Dunnigan cell phone [phone] - has been staying with Mrs. Dempster at rehab facility and this is the phone number he prefers we use to contact him  Signed, Andrez Grime, PA-C 07/22/2013, 5:50 PM

## 2013-07-22 NOTE — Patient Instructions (Signed)
Your physician recommends that you schedule a follow-up appointment in: 3 months with Dr. Allred.  Your physician recommends that you continue on your current medications as directed. Please refer to the Current Medication list given to you today.    

## 2013-07-23 NOTE — Telephone Encounter (Signed)
Patient was seen by Ileene Hutchinson on 07/22/13

## 2013-07-24 DIAGNOSIS — E1149 Type 2 diabetes mellitus with other diabetic neurological complication: Secondary | ICD-10-CM | POA: Diagnosis not present

## 2013-07-24 DIAGNOSIS — I10 Essential (primary) hypertension: Secondary | ICD-10-CM | POA: Diagnosis not present

## 2013-07-24 DIAGNOSIS — R569 Unspecified convulsions: Secondary | ICD-10-CM | POA: Diagnosis not present

## 2013-07-24 DIAGNOSIS — R269 Unspecified abnormalities of gait and mobility: Secondary | ICD-10-CM | POA: Diagnosis not present

## 2013-07-24 DIAGNOSIS — R4701 Aphasia: Secondary | ICD-10-CM | POA: Diagnosis not present

## 2013-07-24 DIAGNOSIS — I4891 Unspecified atrial fibrillation: Secondary | ICD-10-CM | POA: Diagnosis not present

## 2013-07-24 DIAGNOSIS — I6350 Cerebral infarction due to unspecified occlusion or stenosis of unspecified cerebral artery: Secondary | ICD-10-CM | POA: Diagnosis not present

## 2013-07-27 ENCOUNTER — Encounter: Payer: Self-pay | Admitting: Cardiology

## 2013-08-04 DIAGNOSIS — R569 Unspecified convulsions: Secondary | ICD-10-CM | POA: Diagnosis not present

## 2013-08-07 DIAGNOSIS — I10 Essential (primary) hypertension: Secondary | ICD-10-CM | POA: Diagnosis not present

## 2013-08-07 DIAGNOSIS — I6350 Cerebral infarction due to unspecified occlusion or stenosis of unspecified cerebral artery: Secondary | ICD-10-CM | POA: Diagnosis not present

## 2013-08-07 DIAGNOSIS — R569 Unspecified convulsions: Secondary | ICD-10-CM | POA: Diagnosis not present

## 2013-08-07 DIAGNOSIS — R269 Unspecified abnormalities of gait and mobility: Secondary | ICD-10-CM | POA: Diagnosis not present

## 2013-08-07 DIAGNOSIS — E1149 Type 2 diabetes mellitus with other diabetic neurological complication: Secondary | ICD-10-CM | POA: Diagnosis not present

## 2013-08-13 DIAGNOSIS — I6789 Other cerebrovascular disease: Secondary | ICD-10-CM | POA: Diagnosis not present

## 2013-08-13 DIAGNOSIS — E119 Type 2 diabetes mellitus without complications: Secondary | ICD-10-CM | POA: Diagnosis not present

## 2013-08-13 DIAGNOSIS — R1310 Dysphagia, unspecified: Secondary | ICD-10-CM | POA: Diagnosis not present

## 2013-08-13 DIAGNOSIS — R4701 Aphasia: Secondary | ICD-10-CM | POA: Diagnosis not present

## 2013-08-14 DIAGNOSIS — I69969 Other paralytic syndrome following unspecified cerebrovascular disease affecting unspecified side: Secondary | ICD-10-CM | POA: Diagnosis not present

## 2013-08-14 DIAGNOSIS — R109 Unspecified abdominal pain: Secondary | ICD-10-CM | POA: Diagnosis not present

## 2013-08-14 DIAGNOSIS — I10 Essential (primary) hypertension: Secondary | ICD-10-CM | POA: Diagnosis not present

## 2013-08-14 DIAGNOSIS — R52 Pain, unspecified: Secondary | ICD-10-CM | POA: Diagnosis not present

## 2013-08-14 DIAGNOSIS — E119 Type 2 diabetes mellitus without complications: Secondary | ICD-10-CM | POA: Diagnosis not present

## 2013-08-14 DIAGNOSIS — N390 Urinary tract infection, site not specified: Secondary | ICD-10-CM | POA: Diagnosis not present

## 2013-08-26 DIAGNOSIS — R1310 Dysphagia, unspecified: Secondary | ICD-10-CM | POA: Diagnosis not present

## 2013-08-26 DIAGNOSIS — E119 Type 2 diabetes mellitus without complications: Secondary | ICD-10-CM | POA: Diagnosis not present

## 2013-08-26 DIAGNOSIS — I6789 Other cerebrovascular disease: Secondary | ICD-10-CM | POA: Diagnosis not present

## 2013-08-26 DIAGNOSIS — R4701 Aphasia: Secondary | ICD-10-CM | POA: Diagnosis not present

## 2013-08-29 ENCOUNTER — Telehealth: Payer: Self-pay | Admitting: Family Medicine

## 2013-08-29 NOTE — Telephone Encounter (Signed)
appt scheduled

## 2013-08-30 DIAGNOSIS — E039 Hypothyroidism, unspecified: Secondary | ICD-10-CM | POA: Diagnosis not present

## 2013-08-30 DIAGNOSIS — E785 Hyperlipidemia, unspecified: Secondary | ICD-10-CM | POA: Diagnosis not present

## 2013-08-30 DIAGNOSIS — M109 Gout, unspecified: Secondary | ICD-10-CM | POA: Diagnosis not present

## 2013-08-30 DIAGNOSIS — E119 Type 2 diabetes mellitus without complications: Secondary | ICD-10-CM | POA: Diagnosis not present

## 2013-09-01 ENCOUNTER — Other Ambulatory Visit: Payer: Self-pay | Admitting: *Deleted

## 2013-09-01 ENCOUNTER — Ambulatory Visit: Payer: Medicare Other | Admitting: Family Medicine

## 2013-09-01 DIAGNOSIS — IMO0002 Reserved for concepts with insufficient information to code with codable children: Secondary | ICD-10-CM | POA: Diagnosis not present

## 2013-09-01 DIAGNOSIS — N319 Neuromuscular dysfunction of bladder, unspecified: Secondary | ICD-10-CM | POA: Diagnosis not present

## 2013-09-01 DIAGNOSIS — F329 Major depressive disorder, single episode, unspecified: Secondary | ICD-10-CM | POA: Diagnosis not present

## 2013-09-01 DIAGNOSIS — Z792 Long term (current) use of antibiotics: Secondary | ICD-10-CM | POA: Diagnosis not present

## 2013-09-01 DIAGNOSIS — Z431 Encounter for attention to gastrostomy: Secondary | ICD-10-CM | POA: Diagnosis not present

## 2013-09-01 DIAGNOSIS — R1310 Dysphagia, unspecified: Secondary | ICD-10-CM | POA: Diagnosis not present

## 2013-09-01 DIAGNOSIS — G609 Hereditary and idiopathic neuropathy, unspecified: Secondary | ICD-10-CM | POA: Diagnosis not present

## 2013-09-01 DIAGNOSIS — Z466 Encounter for fitting and adjustment of urinary device: Secondary | ICD-10-CM | POA: Diagnosis not present

## 2013-09-01 DIAGNOSIS — Z7901 Long term (current) use of anticoagulants: Secondary | ICD-10-CM | POA: Diagnosis not present

## 2013-09-01 DIAGNOSIS — F3289 Other specified depressive episodes: Secondary | ICD-10-CM | POA: Diagnosis not present

## 2013-09-01 DIAGNOSIS — E119 Type 2 diabetes mellitus without complications: Secondary | ICD-10-CM | POA: Diagnosis not present

## 2013-09-01 DIAGNOSIS — Z950 Presence of cardiac pacemaker: Secondary | ICD-10-CM | POA: Diagnosis not present

## 2013-09-01 DIAGNOSIS — I4891 Unspecified atrial fibrillation: Secondary | ICD-10-CM | POA: Diagnosis not present

## 2013-09-01 DIAGNOSIS — K5730 Diverticulosis of large intestine without perforation or abscess without bleeding: Secondary | ICD-10-CM | POA: Diagnosis not present

## 2013-09-01 DIAGNOSIS — I69959 Hemiplegia and hemiparesis following unspecified cerebrovascular disease affecting unspecified side: Secondary | ICD-10-CM | POA: Diagnosis not present

## 2013-09-01 DIAGNOSIS — I69991 Dysphagia following unspecified cerebrovascular disease: Secondary | ICD-10-CM | POA: Diagnosis not present

## 2013-09-01 DIAGNOSIS — N390 Urinary tract infection, site not specified: Secondary | ICD-10-CM | POA: Diagnosis not present

## 2013-09-01 MED ORDER — POTASSIUM CHLORIDE CRYS ER 20 MEQ PO TBCR: 40.0000 meq | EXTENDED_RELEASE_TABLET | Freq: Two times a day (BID) | ORAL | Status: DC

## 2013-09-02 DIAGNOSIS — F329 Major depressive disorder, single episode, unspecified: Secondary | ICD-10-CM | POA: Diagnosis not present

## 2013-09-02 DIAGNOSIS — R1310 Dysphagia, unspecified: Secondary | ICD-10-CM | POA: Diagnosis not present

## 2013-09-02 DIAGNOSIS — I69959 Hemiplegia and hemiparesis following unspecified cerebrovascular disease affecting unspecified side: Secondary | ICD-10-CM | POA: Diagnosis not present

## 2013-09-02 DIAGNOSIS — E119 Type 2 diabetes mellitus without complications: Secondary | ICD-10-CM | POA: Diagnosis not present

## 2013-09-02 DIAGNOSIS — I4891 Unspecified atrial fibrillation: Secondary | ICD-10-CM | POA: Diagnosis not present

## 2013-09-02 DIAGNOSIS — I69991 Dysphagia following unspecified cerebrovascular disease: Secondary | ICD-10-CM | POA: Diagnosis not present

## 2013-09-02 DIAGNOSIS — F3289 Other specified depressive episodes: Secondary | ICD-10-CM | POA: Diagnosis not present

## 2013-09-03 ENCOUNTER — Ambulatory Visit (INDEPENDENT_AMBULATORY_CARE_PROVIDER_SITE_OTHER): Payer: Medicare Other | Admitting: Family Medicine

## 2013-09-03 ENCOUNTER — Encounter: Payer: Self-pay | Admitting: Family Medicine

## 2013-09-03 VITALS — BP 113/78 | HR 62 | Temp 95.8°F | Ht 64.0 in

## 2013-09-03 DIAGNOSIS — I6349 Cerebral infarction due to embolism of other cerebral artery: Secondary | ICD-10-CM | POA: Insufficient documentation

## 2013-09-03 DIAGNOSIS — I6340 Cerebral infarction due to embolism of unspecified cerebral artery: Secondary | ICD-10-CM

## 2013-09-03 DIAGNOSIS — Z0000 Encounter for general adult medical examination without abnormal findings: Secondary | ICD-10-CM

## 2013-09-03 DIAGNOSIS — I10 Essential (primary) hypertension: Secondary | ICD-10-CM | POA: Diagnosis not present

## 2013-09-03 DIAGNOSIS — I4891 Unspecified atrial fibrillation: Secondary | ICD-10-CM

## 2013-09-03 DIAGNOSIS — N390 Urinary tract infection, site not specified: Secondary | ICD-10-CM

## 2013-09-03 DIAGNOSIS — E039 Hypothyroidism, unspecified: Secondary | ICD-10-CM

## 2013-09-03 DIAGNOSIS — Z09 Encounter for follow-up examination after completed treatment for conditions other than malignant neoplasm: Secondary | ICD-10-CM

## 2013-09-03 DIAGNOSIS — Z79899 Other long term (current) drug therapy: Secondary | ICD-10-CM | POA: Diagnosis not present

## 2013-09-03 DIAGNOSIS — Z9889 Other specified postprocedural states: Secondary | ICD-10-CM

## 2013-09-03 DIAGNOSIS — Z931 Gastrostomy status: Secondary | ICD-10-CM

## 2013-09-03 DIAGNOSIS — Z960 Presence of urogenital implants: Secondary | ICD-10-CM

## 2013-09-03 DIAGNOSIS — Z978 Presence of other specified devices: Secondary | ICD-10-CM

## 2013-09-03 NOTE — Patient Instructions (Signed)
Please continue and complete the current antibiotic We will request the home health nurse to get another urine and urine culture and sensitivity when the antibiotic is completed Please keep your appointment with the cardiologist Please keep your appointment with the neurologist We will arrange an appointment with the urologist here to further evaluate the ongoing need for the indwelling catheter We will arrange a visit for you to see the gastroenterologist to further evaluate the ongoing need for the peg tube We'll also try to arrange to try to find someone as a caregiver until you're able to get her strength back Continue with her physical therapy Continue to drink plenty of fluids

## 2013-09-03 NOTE — Progress Notes (Signed)
Subjective:    Patient ID: [name], female    DOB: 1946/01/27, 68 y.o.   MRN: [id number]  HPI Patient here today for follow up from Stroke. She came home from rehab facility on Saturday. She is accompanied today by her husband and her daughter. They bring in a list of medications from the facility and they are reviewed today and our list has been updated. She had surgery on 04/15/13 and sustained the stroke,  then went to [facility] for rehab, then from there went to [facility] and rehabilitation.     Patient Active Problem List   Diagnosis Date Noted  . Metabolic syndrome 80/32/1224  . High risk medication use 03/13/2013  . Fever, unspecified 06/05/2012  . Generalized abdominal pain 06/05/2012  . PAF (paroxysmal atrial fibrillation) 02/07/2012  . Tachy-brady syndrome   . Impaired glucose tolerance   . FATIGUE 12/22/2009  . SNORING 12/22/2009  . OTHER CHRONIC NONALCOHOLIC LIVER DISEASE 82/50/0370  . NONSPEC ELEVATION OF LEVELS OF TRANSAMINASE/LDH 03/09/2009  . PPM-St.Jude 09/01/2008  . BRADYCARDIA 08/13/2008  . HYPOTHYROIDISM 06/09/2008  . HYPERTENSION 06/09/2008  . Atrial fibrillation 06/09/2008  . PREMATURE VENTRICULAR CONTRACTIONS 06/09/2008  . IRRITABLE BOWEL SYNDROME 06/09/2008  . PSORIASIS 06/09/2008  . EDEMA 06/09/2008  . DIVERTICULOSIS, COLON, HX OF 06/09/2008   Outpatient Encounter Prescriptions as of 09/03/2013  Medication Sig  . acetaminophen (TYLENOL) 160 MG/5ML solution 650 mg as needed.  Marland Kitchen aspirin 81 MG tablet Take 81 mg by mouth daily.  Marland Kitchen bismuth subsalicylate (PEPTO BISMOL) 262 MG/15ML suspension Take 30 mLs by mouth 4 (four) times daily -  before meals and at bedtime.  . Calcium Citrate-Vitamin D (CITRACAL PETITES/VITAMIN D PO) Take 2 tablets by mouth daily.  Marland Kitchen dicyclomine (BENTYL) 10 MG capsule Take 10 mg by mouth 4 (four) times daily -  before meals and at bedtime.  . dronabinol (MARINOL) 2.5 MG capsule Take 10 mg by mouth 2  (two) times daily before lunch and supper.  Marland Kitchen Fexofenadine HCl (ALLEGRA PO) Take 0.1 tablets by mouth daily.  . flecainide (TAMBOCOR) 50 MG tablet Take 1 tablet (50 mg total) by mouth 2 (two) times daily.  . fluticasone (FLONASE) 50 MCG/ACT nasal spray Place 2 sprays into the nose as needed.  . lansoprazole (PREVACID) 30 MG capsule Take 30 mg by mouth daily at 12 noon.  . levETIRAcetam (KEPPRA) 250 MG tablet Take 500 mg by mouth 2 (two) times daily.   Marland Kitchen levothyroxine (SYNTHROID, LEVOTHROID) 88 MCG tablet TAKE 1 TABLET BY MOUTH DAILY BEFORE BREAKFAST  . loperamide (IMODIUM) 2 MG capsule Take by mouth as needed for diarrhea or loose stools.  . metFORMIN (GLUCOPHAGE-XR) 500 MG 24 hr tablet TAKE 1 TABLET BY MOUTH EVERY DAY WITH BREAKFAST  . methotrexate (RHEUMATREX) 2.5 MG tablet Take 10 mg by mouth once a week. Caution:Chemotherapy. Protect from light.  . metoprolol (LOPRESSOR) 50 MG tablet 1 1/2 tab bid  . mirtazapine (REMERON) 15 MG tablet 1/2 tab po qd  . nitrofurantoin, macrocrystal-monohydrate, (MACROBID) 100 MG capsule Take 100 mg by mouth 2 (two) times daily.  Marland Kitchen nystatin-triamcinolone (MYCOLOG II) cream Apply 1 application topically 2 (two) times daily.  . potassium chloride SA (K-DUR,KLOR-CON) 20 MEQ tablet Take 2 tablets (40 mEq total) by mouth 2 (two) times daily.  . predniSONE (DELTASONE) 20 MG tablet Take 20 mg by mouth as needed. gout  . simvastatin (ZOCOR) 40 MG tablet Take 40 mg by mouth daily.  . traMADol Veatrice Bourbon)  50 MG tablet Take 1 tablet (50 mg total) by mouth every 8 (eight) hours as needed for pain.  Marland Kitchen XARELTO 20 MG TABS tablet TAKE ONE TABLET BY MOUTH EVERY DAY  . [DISCONTINUED] ARTIFICIAL TEAR OP Place 1 drop into both eyes 2 (two) times daily as needed. For dry eyes  . [DISCONTINUED] clobetasol cream (TEMOVATE) 2.99 % Apply 1 application topically daily as needed (for psoriasis).   . [DISCONTINUED] LORazepam (ATIVAN) 0.5 MG tablet TAKE 1 TABLET BY MOUTH THREE TIMES DAILY     Review of Systems  Constitutional: Positive for appetite change (decrease in appetite and low fluid intake).  HENT: Negative for trouble swallowing.        Passed her swallowing test and can drink some regular fluids.   Respiratory: Negative for choking and shortness of breath.   Gastrointestinal: Positive for constipation.       Frequent UTI       Objective:   Physical Exam  Nursing note and vitals reviewed. Constitutional: She is oriented to person, place, and time. No distress.  The patient comes to the visit today in a wheelchair. She is only able to stand and move with assistance. She is quiet and has lost a considerable amount of weight since she was last seen. Her speech is difficult to understand  HENT:  Head: Normocephalic and atraumatic.  Right Ear: External ear normal.  Left Ear: External ear normal.  Nose: Nose normal.  Mouth/Throat: Oropharynx is clear and moist.  She has right-sided facial weakness, the tongue was somewhat dry   Eyes: Conjunctivae and EOM are normal. Pupils are equal, round, and reactive to light. Right eye exhibits no discharge. Left eye exhibits no discharge. No scleral icterus.  Neck: Normal range of motion. Neck supple. No thyromegaly present.  Cardiovascular: Normal rate, regular rhythm and normal heart sounds.  Exam reveals no gallop and no friction rub.   No murmur heard. At 72 per minute  Pulmonary/Chest: Effort normal and breath sounds normal. No respiratory distress. She has no wheezes. She has no rales. She exhibits no tenderness.  Abdominal: Soft. Bowel sounds are normal. She exhibits no mass. There is no tenderness. There is no rebound and no guarding.  Minimal abdominal tenderness with palpation while sitting in her wheelchair  Musculoskeletal: She exhibits no edema and no tenderness.  The range of motion is decreased on the right side both upper and lower extremities. There is movement. There is movement with resistance.   Lymphadenopathy:    She has no cervical adenopathy.  Neurological: She is alert and oriented to person, place, and time. She has normal reflexes. No cranial nerve deficit.  Skin: Skin is warm and dry. No rash noted.  Psychiatric: Judgment and thought content normal.  The mood was somewhat flat flattened and depressed and tearful initially when I went in exam room   BP 113/78  Pulse 62  Temp(Src) 95.8 F (35.4 C) (Oral)  Ht _0  (1.626 m)  SpO2 98%        Assessment & Plan:  1. Hospital discharge follow-up - Hepatic function panel - BMP8+EGFR - Thyroid Panel With TSH - POCT glycosylated hemoglobin (Hb A1C) - CBC With differential/Platelet - Ambulatory referral to Gastroenterology  2. Healthcare maintenance - Hepatic function panel - BMP8+EGFR - Thyroid Panel With TSH - POCT glycosylated hemoglobin (Hb A1C) - CBC With differential/Platelet - Ambulatory referral to Gastroenterology  3. Atrial fibrillation, unspecified - CBC With differential/Platelet  4. HYPOTHYROIDISM - Thyroid Panel With  TSH - CBC With differential/Platelet  5. HYPERTENSION - BMP8+EGFR - CBC With differential/Platelet  6. High risk medication use - CBC With differential/Platelet  7. Status post insertion of percutaneous endoscopic gastrostomy (PEG) tube - Ambulatory referral to Gastroenterology  8. Chronic indwelling Foley catheter - Ambulatory referral to Urology  9. Frequent UTI - Ambulatory referral to Urology  10. Cerebral infarction due to embolism of other cerebral artery Meds ordered this encounter  Medications  . dronabinol (MARINOL) 2.5 MG capsule    Sig: Take 10 mg by mouth 2 (two) times daily before lunch and supper.  . nystatin-triamcinolone (MYCOLOG II) cream    Sig: Apply 1 application topically 2 (two) times daily.  . nitrofurantoin, macrocrystal-monohydrate, (MACROBID) 100 MG capsule    Sig: Take 100 mg by mouth 2 (two) times daily.  Marland Kitchen dicyclomine (BENTYL) 10 MG  capsule    Sig: Take 10 mg by mouth 4 (four) times daily -  before meals and at bedtime.  . bismuth subsalicylate (PEPTO BISMOL) 262 MG/15ML suspension    Sig: Take 30 mLs by mouth 4 (four) times daily -  before meals and at bedtime.   Patient Instructions  Please continue and complete the current antibiotic We will request the home health nurse to get another urine and urine culture and sensitivity when the antibiotic is completed Please keep your appointment with the cardiologist Please keep your appointment with the neurologist We will arrange an appointment with the urologist here to further evaluate the ongoing need for the indwelling catheter We will arrange a visit for you to see the gastroenterologist to further evaluate the ongoing need for the peg tube We'll also try to arrange to try to find someone as a caregiver until you're able to get her strength back Continue with her physical therapy Continue to drink plenty of fluids    Arrie Senate MD

## 2013-09-04 ENCOUNTER — Telehealth: Payer: Self-pay | Admitting: Family Medicine

## 2013-09-04 DIAGNOSIS — E119 Type 2 diabetes mellitus without complications: Secondary | ICD-10-CM | POA: Diagnosis not present

## 2013-09-04 DIAGNOSIS — I69959 Hemiplegia and hemiparesis following unspecified cerebrovascular disease affecting unspecified side: Secondary | ICD-10-CM | POA: Diagnosis not present

## 2013-09-04 DIAGNOSIS — F3289 Other specified depressive episodes: Secondary | ICD-10-CM | POA: Diagnosis not present

## 2013-09-04 DIAGNOSIS — I4891 Unspecified atrial fibrillation: Secondary | ICD-10-CM | POA: Diagnosis not present

## 2013-09-04 DIAGNOSIS — F329 Major depressive disorder, single episode, unspecified: Secondary | ICD-10-CM | POA: Diagnosis not present

## 2013-09-04 DIAGNOSIS — I69991 Dysphagia following unspecified cerebrovascular disease: Secondary | ICD-10-CM | POA: Diagnosis not present

## 2013-09-04 DIAGNOSIS — R1310 Dysphagia, unspecified: Secondary | ICD-10-CM | POA: Diagnosis not present

## 2013-09-04 MED ORDER — RIVAROXABAN 20 MG PO TABS: ORAL_TABLET | ORAL | Status: DC

## 2013-09-04 MED ORDER — POTASSIUM CHLORIDE 2 MEQ/ML FOR ORAL USE: ORAL | Status: DC

## 2013-09-04 MED ORDER — LANSOPRAZOLE 30 MG PO CPDR: 30.0000 mg | DELAYED_RELEASE_CAPSULE | Freq: Every day | ORAL | Status: DC

## 2013-09-04 MED ORDER — LEVETIRACETAM 250 MG PO TABS: 500.0000 mg | ORAL_TABLET | Freq: Two times a day (BID) | ORAL | Status: DC

## 2013-09-04 NOTE — Telephone Encounter (Signed)
K+ liquid rx sent to pharmacy

## 2013-09-04 NOTE — Telephone Encounter (Signed)
Can you review please since Dr. Laurance Flatten off until next week. Per pt husband need liquid Potasium sent to [facility], [place], [place]

## 2013-09-04 NOTE — Telephone Encounter (Signed)
Husband aware Rx sent to pharmacy by MMM.

## 2013-09-04 NOTE — Telephone Encounter (Signed)
[facility] pt 

## 2013-09-05 ENCOUNTER — Other Ambulatory Visit: Payer: Self-pay | Admitting: *Deleted

## 2013-09-05 DIAGNOSIS — I4891 Unspecified atrial fibrillation: Secondary | ICD-10-CM | POA: Diagnosis not present

## 2013-09-05 DIAGNOSIS — R1310 Dysphagia, unspecified: Secondary | ICD-10-CM | POA: Diagnosis not present

## 2013-09-05 DIAGNOSIS — E119 Type 2 diabetes mellitus without complications: Secondary | ICD-10-CM | POA: Diagnosis not present

## 2013-09-05 DIAGNOSIS — I69959 Hemiplegia and hemiparesis following unspecified cerebrovascular disease affecting unspecified side: Secondary | ICD-10-CM | POA: Diagnosis not present

## 2013-09-05 DIAGNOSIS — F3289 Other specified depressive episodes: Secondary | ICD-10-CM | POA: Diagnosis not present

## 2013-09-05 DIAGNOSIS — F329 Major depressive disorder, single episode, unspecified: Secondary | ICD-10-CM | POA: Diagnosis not present

## 2013-09-05 DIAGNOSIS — I69991 Dysphagia following unspecified cerebrovascular disease: Secondary | ICD-10-CM | POA: Diagnosis not present

## 2013-09-05 LAB — BMP8+EGFR
BUN/Creatinine Ratio: 10 — ABNORMAL LOW (ref 11–26)
BUN: 6 mg/dL — ABNORMAL LOW (ref 8–27)
CO2: 19 mmol/L (ref 18–29)
Calcium: 10.3 mg/dL (ref 8.7–10.3)
Chloride: 101 mmol/L (ref 97–108)
Creatinine, Ser: 0.59 mg/dL (ref 0.57–1.00)
GFR calc Af Amer: 110 mL/min/{1.73_m2} (ref >59)
GFR calc non Af Amer: 96 mL/min/{1.73_m2} (ref >59)
Glucose: 84 mg/dL (ref 65–99)
Potassium: 4.5 mmol/L (ref 3.5–5.2)
Sodium: 141 mmol/L (ref 134–144)

## 2013-09-05 LAB — CBC WITH DIFFERENTIAL
Basophils Absolute: 0 10*3/uL (ref 0.0–0.2)
Basos: 0 %
Eos: 9 %
Eosinophils Absolute: 1 10*3/uL — ABNORMAL HIGH (ref 0.0–0.4)
HCT: 41 % (ref 34.0–46.6)
Hemoglobin: 13.7 g/dL (ref 11.1–15.9)
Immature Grans (Abs): 0 10*3/uL (ref 0.0–0.1)
Immature Granulocytes: 0 %
Lymphocytes Absolute: 3.1 10*3/uL (ref 0.7–3.1)
Lymphs: 28 %
MCH: 30.1 pg (ref 26.6–33.0)
MCHC: 33.4 g/dL (ref 31.5–35.7)
MCV: 90 fL (ref 79–97)
Monocytes Absolute: 1 10*3/uL — ABNORMAL HIGH (ref 0.1–0.9)
Monocytes: 9 %
Neutrophils Absolute: 5.7 10*3/uL (ref 1.4–7.0)
Neutrophils Relative %: 54 %
Platelets: 385 10*3/uL — ABNORMAL HIGH (ref 150–379)
RBC: 4.55 x10E6/uL (ref 3.77–5.28)
RDW: 15.3 % (ref 12.3–15.4)
WBC: 10.8 10*3/uL (ref 3.4–10.8)

## 2013-09-05 LAB — HEPATIC FUNCTION PANEL
ALT: 17 IU/L (ref 0–32)
AST: 27 IU/L (ref 0–40)
Albumin: 4.4 g/dL (ref 3.6–4.8)
Alkaline Phosphatase: 60 IU/L (ref 39–117)
Bilirubin, Direct: 0.13 mg/dL (ref 0.00–0.40)
Total Bilirubin: 0.4 mg/dL (ref 0.0–1.2)
Total Protein: 6.7 g/dL (ref 6.0–8.5)

## 2013-09-05 LAB — THYROID PANEL WITH TSH
Free Thyroxine Index: 3.5 (ref 1.2–4.9)
T3 Uptake Ratio: 27 % (ref 24–39)
T4, Total: 13.1 ug/dL — ABNORMAL HIGH (ref 4.5–12.0)
TSH: 0.97 u[IU]/mL (ref 0.450–4.500)

## 2013-09-05 MED ORDER — NYSTATIN-TRIAMCINOLONE 100000-0.1 UNIT/GM-% EX CREA: 1.0000 "application " | TOPICAL_CREAM | Freq: Two times a day (BID) | CUTANEOUS | Status: DC

## 2013-09-08 DIAGNOSIS — R1310 Dysphagia, unspecified: Secondary | ICD-10-CM | POA: Diagnosis not present

## 2013-09-08 DIAGNOSIS — I69959 Hemiplegia and hemiparesis following unspecified cerebrovascular disease affecting unspecified side: Secondary | ICD-10-CM | POA: Diagnosis not present

## 2013-09-08 DIAGNOSIS — I69991 Dysphagia following unspecified cerebrovascular disease: Secondary | ICD-10-CM | POA: Diagnosis not present

## 2013-09-08 DIAGNOSIS — I4891 Unspecified atrial fibrillation: Secondary | ICD-10-CM | POA: Diagnosis not present

## 2013-09-08 DIAGNOSIS — F329 Major depressive disorder, single episode, unspecified: Secondary | ICD-10-CM | POA: Diagnosis not present

## 2013-09-08 DIAGNOSIS — F3289 Other specified depressive episodes: Secondary | ICD-10-CM | POA: Diagnosis not present

## 2013-09-08 DIAGNOSIS — E119 Type 2 diabetes mellitus without complications: Secondary | ICD-10-CM | POA: Diagnosis not present

## 2013-09-09 DIAGNOSIS — F3289 Other specified depressive episodes: Secondary | ICD-10-CM | POA: Diagnosis not present

## 2013-09-09 DIAGNOSIS — I69959 Hemiplegia and hemiparesis following unspecified cerebrovascular disease affecting unspecified side: Secondary | ICD-10-CM | POA: Diagnosis not present

## 2013-09-09 DIAGNOSIS — E119 Type 2 diabetes mellitus without complications: Secondary | ICD-10-CM | POA: Diagnosis not present

## 2013-09-09 DIAGNOSIS — R1310 Dysphagia, unspecified: Secondary | ICD-10-CM | POA: Diagnosis not present

## 2013-09-09 DIAGNOSIS — F329 Major depressive disorder, single episode, unspecified: Secondary | ICD-10-CM | POA: Diagnosis not present

## 2013-09-09 DIAGNOSIS — I69991 Dysphagia following unspecified cerebrovascular disease: Secondary | ICD-10-CM | POA: Diagnosis not present

## 2013-09-09 DIAGNOSIS — I4891 Unspecified atrial fibrillation: Secondary | ICD-10-CM | POA: Diagnosis not present

## 2013-09-10 DIAGNOSIS — I4891 Unspecified atrial fibrillation: Secondary | ICD-10-CM | POA: Diagnosis not present

## 2013-09-10 DIAGNOSIS — I69959 Hemiplegia and hemiparesis following unspecified cerebrovascular disease affecting unspecified side: Secondary | ICD-10-CM | POA: Diagnosis not present

## 2013-09-10 DIAGNOSIS — I69991 Dysphagia following unspecified cerebrovascular disease: Secondary | ICD-10-CM | POA: Diagnosis not present

## 2013-09-10 DIAGNOSIS — R1310 Dysphagia, unspecified: Secondary | ICD-10-CM | POA: Diagnosis not present

## 2013-09-10 DIAGNOSIS — F3289 Other specified depressive episodes: Secondary | ICD-10-CM | POA: Diagnosis not present

## 2013-09-10 DIAGNOSIS — E119 Type 2 diabetes mellitus without complications: Secondary | ICD-10-CM | POA: Diagnosis not present

## 2013-09-10 DIAGNOSIS — F329 Major depressive disorder, single episode, unspecified: Secondary | ICD-10-CM | POA: Diagnosis not present

## 2013-09-11 ENCOUNTER — Telehealth: Payer: Self-pay | Admitting: Family Medicine

## 2013-09-12 DIAGNOSIS — I69991 Dysphagia following unspecified cerebrovascular disease: Secondary | ICD-10-CM | POA: Diagnosis not present

## 2013-09-12 DIAGNOSIS — F329 Major depressive disorder, single episode, unspecified: Secondary | ICD-10-CM | POA: Diagnosis not present

## 2013-09-12 DIAGNOSIS — F3289 Other specified depressive episodes: Secondary | ICD-10-CM | POA: Diagnosis not present

## 2013-09-12 DIAGNOSIS — R1310 Dysphagia, unspecified: Secondary | ICD-10-CM | POA: Diagnosis not present

## 2013-09-12 DIAGNOSIS — I4891 Unspecified atrial fibrillation: Secondary | ICD-10-CM | POA: Diagnosis not present

## 2013-09-12 DIAGNOSIS — E119 Type 2 diabetes mellitus without complications: Secondary | ICD-10-CM | POA: Diagnosis not present

## 2013-09-12 DIAGNOSIS — I69959 Hemiplegia and hemiparesis following unspecified cerebrovascular disease affecting unspecified side: Secondary | ICD-10-CM | POA: Diagnosis not present

## 2013-09-15 DIAGNOSIS — I4891 Unspecified atrial fibrillation: Secondary | ICD-10-CM | POA: Diagnosis not present

## 2013-09-15 DIAGNOSIS — G8190 Hemiplegia, unspecified affecting unspecified side: Secondary | ICD-10-CM | POA: Diagnosis not present

## 2013-09-15 DIAGNOSIS — G40909 Epilepsy, unspecified, not intractable, without status epilepticus: Secondary | ICD-10-CM | POA: Diagnosis not present

## 2013-09-15 DIAGNOSIS — E039 Hypothyroidism, unspecified: Secondary | ICD-10-CM | POA: Diagnosis not present

## 2013-09-15 DIAGNOSIS — I6350 Cerebral infarction due to unspecified occlusion or stenosis of unspecified cerebral artery: Secondary | ICD-10-CM | POA: Diagnosis not present

## 2013-09-15 DIAGNOSIS — N390 Urinary tract infection, site not specified: Secondary | ICD-10-CM | POA: Diagnosis not present

## 2013-09-15 DIAGNOSIS — Z466 Encounter for fitting and adjustment of urinary device: Secondary | ICD-10-CM | POA: Diagnosis not present

## 2013-09-15 DIAGNOSIS — Z8673 Personal history of transient ischemic attack (TIA), and cerebral infarction without residual deficits: Secondary | ICD-10-CM | POA: Diagnosis not present

## 2013-09-15 DIAGNOSIS — K219 Gastro-esophageal reflux disease without esophagitis: Secondary | ICD-10-CM | POA: Diagnosis not present

## 2013-09-15 DIAGNOSIS — T83091A Other mechanical complication of indwelling urethral catheter, initial encounter: Secondary | ICD-10-CM | POA: Diagnosis not present

## 2013-09-15 DIAGNOSIS — E119 Type 2 diabetes mellitus without complications: Secondary | ICD-10-CM | POA: Diagnosis not present

## 2013-09-15 DIAGNOSIS — R4701 Aphasia: Secondary | ICD-10-CM | POA: Diagnosis not present

## 2013-09-16 DIAGNOSIS — I69991 Dysphagia following unspecified cerebrovascular disease: Secondary | ICD-10-CM | POA: Diagnosis not present

## 2013-09-16 DIAGNOSIS — R1310 Dysphagia, unspecified: Secondary | ICD-10-CM | POA: Diagnosis not present

## 2013-09-16 DIAGNOSIS — I69959 Hemiplegia and hemiparesis following unspecified cerebrovascular disease affecting unspecified side: Secondary | ICD-10-CM | POA: Diagnosis not present

## 2013-09-16 DIAGNOSIS — E119 Type 2 diabetes mellitus without complications: Secondary | ICD-10-CM | POA: Diagnosis not present

## 2013-09-16 DIAGNOSIS — I4891 Unspecified atrial fibrillation: Secondary | ICD-10-CM | POA: Diagnosis not present

## 2013-09-16 DIAGNOSIS — F3289 Other specified depressive episodes: Secondary | ICD-10-CM | POA: Diagnosis not present

## 2013-09-16 DIAGNOSIS — F329 Major depressive disorder, single episode, unspecified: Secondary | ICD-10-CM | POA: Diagnosis not present

## 2013-09-17 DIAGNOSIS — I69959 Hemiplegia and hemiparesis following unspecified cerebrovascular disease affecting unspecified side: Secondary | ICD-10-CM | POA: Diagnosis not present

## 2013-09-17 DIAGNOSIS — I69991 Dysphagia following unspecified cerebrovascular disease: Secondary | ICD-10-CM | POA: Diagnosis not present

## 2013-09-17 DIAGNOSIS — F329 Major depressive disorder, single episode, unspecified: Secondary | ICD-10-CM | POA: Diagnosis not present

## 2013-09-17 DIAGNOSIS — F3289 Other specified depressive episodes: Secondary | ICD-10-CM | POA: Diagnosis not present

## 2013-09-17 DIAGNOSIS — R1310 Dysphagia, unspecified: Secondary | ICD-10-CM | POA: Diagnosis not present

## 2013-09-17 DIAGNOSIS — E119 Type 2 diabetes mellitus without complications: Secondary | ICD-10-CM | POA: Diagnosis not present

## 2013-09-17 DIAGNOSIS — I4891 Unspecified atrial fibrillation: Secondary | ICD-10-CM | POA: Diagnosis not present

## 2013-09-18 ENCOUNTER — Other Ambulatory Visit: Payer: Self-pay | Admitting: Nurse Practitioner

## 2013-09-18 DIAGNOSIS — F329 Major depressive disorder, single episode, unspecified: Secondary | ICD-10-CM | POA: Diagnosis not present

## 2013-09-18 DIAGNOSIS — I69991 Dysphagia following unspecified cerebrovascular disease: Secondary | ICD-10-CM | POA: Diagnosis not present

## 2013-09-18 DIAGNOSIS — F3289 Other specified depressive episodes: Secondary | ICD-10-CM | POA: Diagnosis not present

## 2013-09-18 DIAGNOSIS — E119 Type 2 diabetes mellitus without complications: Secondary | ICD-10-CM | POA: Diagnosis not present

## 2013-09-18 DIAGNOSIS — I4891 Unspecified atrial fibrillation: Secondary | ICD-10-CM | POA: Diagnosis not present

## 2013-09-18 DIAGNOSIS — R1310 Dysphagia, unspecified: Secondary | ICD-10-CM | POA: Diagnosis not present

## 2013-09-18 DIAGNOSIS — I69959 Hemiplegia and hemiparesis following unspecified cerebrovascular disease affecting unspecified side: Secondary | ICD-10-CM | POA: Diagnosis not present

## 2013-09-18 MED ORDER — METFORMIN HCL ER 500 MG PO TB24: ORAL_TABLET | ORAL | Status: DC

## 2013-09-18 MED ORDER — MIRTAZAPINE 15 MG PO TABS
ORAL_TABLET | ORAL | Status: DC
Start: 2013-09-18 — End: 2013-09-24

## 2013-09-19 ENCOUNTER — Other Ambulatory Visit: Payer: Self-pay | Admitting: *Deleted

## 2013-09-19 DIAGNOSIS — I69959 Hemiplegia and hemiparesis following unspecified cerebrovascular disease affecting unspecified side: Secondary | ICD-10-CM | POA: Diagnosis not present

## 2013-09-19 DIAGNOSIS — E119 Type 2 diabetes mellitus without complications: Secondary | ICD-10-CM | POA: Diagnosis not present

## 2013-09-19 DIAGNOSIS — R1310 Dysphagia, unspecified: Secondary | ICD-10-CM | POA: Diagnosis not present

## 2013-09-19 DIAGNOSIS — I4891 Unspecified atrial fibrillation: Secondary | ICD-10-CM | POA: Diagnosis not present

## 2013-09-19 DIAGNOSIS — F3289 Other specified depressive episodes: Secondary | ICD-10-CM | POA: Diagnosis not present

## 2013-09-19 DIAGNOSIS — I69991 Dysphagia following unspecified cerebrovascular disease: Secondary | ICD-10-CM | POA: Diagnosis not present

## 2013-09-19 DIAGNOSIS — F329 Major depressive disorder, single episode, unspecified: Secondary | ICD-10-CM | POA: Diagnosis not present

## 2013-09-19 MED ORDER — CALCIUM CITRATE-VITAMIN D 200-250 MG-UNIT PO TABS: 2.0000 | ORAL_TABLET | Freq: Every morning | ORAL | Status: DC

## 2013-09-19 MED ORDER — LEVOTHYROXINE SODIUM 88 MCG PO TABS: ORAL_TABLET | ORAL | Status: DC

## 2013-09-22 ENCOUNTER — Telehealth: Payer: Self-pay | Admitting: Family Medicine

## 2013-09-22 ENCOUNTER — Other Ambulatory Visit: Payer: Self-pay

## 2013-09-22 DIAGNOSIS — I69991 Dysphagia following unspecified cerebrovascular disease: Secondary | ICD-10-CM | POA: Diagnosis not present

## 2013-09-22 DIAGNOSIS — I4891 Unspecified atrial fibrillation: Secondary | ICD-10-CM | POA: Diagnosis not present

## 2013-09-22 DIAGNOSIS — I69959 Hemiplegia and hemiparesis following unspecified cerebrovascular disease affecting unspecified side: Secondary | ICD-10-CM | POA: Diagnosis not present

## 2013-09-22 DIAGNOSIS — F3289 Other specified depressive episodes: Secondary | ICD-10-CM | POA: Diagnosis not present

## 2013-09-22 DIAGNOSIS — E119 Type 2 diabetes mellitus without complications: Secondary | ICD-10-CM | POA: Diagnosis not present

## 2013-09-22 DIAGNOSIS — F329 Major depressive disorder, single episode, unspecified: Secondary | ICD-10-CM | POA: Diagnosis not present

## 2013-09-22 DIAGNOSIS — R1310 Dysphagia, unspecified: Secondary | ICD-10-CM | POA: Diagnosis not present

## 2013-09-22 NOTE — Telephone Encounter (Signed)
Patient was seen at [facility] and they were treating her for a UTI with Cipro and they done a culture and told her that it was MRSA and changed antibiotic to [place]. It is making patient feel bad and causing severe diarrhea can we change antibiotic. I have called for records and gave them to [facility]

## 2013-09-22 NOTE — Telephone Encounter (Signed)
Called [facility] and ordered C Diff for tomorrow

## 2013-09-22 NOTE — Telephone Encounter (Signed)
Make sure that patient takes a probiotic like align Also make sure that she is taking the antibiotic with food Also make sure that the home health nurse does a stool culture for C. Difficile How much more antibiotic today she had to take before the course is completed? Make sure that we get a copy of the urine culture report, this is especially to see if there any other antibiotics that will cover the MRSA infection that she has.

## 2013-09-22 NOTE — Telephone Encounter (Signed)
Discussed with husband. She has taken 2 doses of Macrobid. It was prescribed for BID x 5 days. Explained that Macrobid is the only medication that she can take that will cover both bacteria. Advised to use probiotics, take with food and to increase fluid intake.  She does have home health services through [facility] (ph # [phone]). We will need to contact them and have them to test for C Dif and assess for dehydration.

## 2013-09-23 DIAGNOSIS — E119 Type 2 diabetes mellitus without complications: Secondary | ICD-10-CM | POA: Diagnosis not present

## 2013-09-23 DIAGNOSIS — I4891 Unspecified atrial fibrillation: Secondary | ICD-10-CM | POA: Diagnosis not present

## 2013-09-23 DIAGNOSIS — I69991 Dysphagia following unspecified cerebrovascular disease: Secondary | ICD-10-CM | POA: Diagnosis not present

## 2013-09-23 DIAGNOSIS — R1310 Dysphagia, unspecified: Secondary | ICD-10-CM | POA: Diagnosis not present

## 2013-09-23 DIAGNOSIS — I69959 Hemiplegia and hemiparesis following unspecified cerebrovascular disease affecting unspecified side: Secondary | ICD-10-CM | POA: Diagnosis not present

## 2013-09-23 DIAGNOSIS — F3289 Other specified depressive episodes: Secondary | ICD-10-CM | POA: Diagnosis not present

## 2013-09-23 DIAGNOSIS — F329 Major depressive disorder, single episode, unspecified: Secondary | ICD-10-CM | POA: Diagnosis not present

## 2013-09-24 ENCOUNTER — Other Ambulatory Visit: Payer: Self-pay | Admitting: *Deleted

## 2013-09-24 DIAGNOSIS — F329 Major depressive disorder, single episode, unspecified: Secondary | ICD-10-CM | POA: Diagnosis not present

## 2013-09-24 DIAGNOSIS — F3289 Other specified depressive episodes: Secondary | ICD-10-CM | POA: Diagnosis not present

## 2013-09-24 DIAGNOSIS — R1310 Dysphagia, unspecified: Secondary | ICD-10-CM | POA: Diagnosis not present

## 2013-09-24 DIAGNOSIS — I69959 Hemiplegia and hemiparesis following unspecified cerebrovascular disease affecting unspecified side: Secondary | ICD-10-CM | POA: Diagnosis not present

## 2013-09-24 DIAGNOSIS — I69991 Dysphagia following unspecified cerebrovascular disease: Secondary | ICD-10-CM | POA: Diagnosis not present

## 2013-09-24 DIAGNOSIS — I4891 Unspecified atrial fibrillation: Secondary | ICD-10-CM | POA: Diagnosis not present

## 2013-09-24 DIAGNOSIS — E119 Type 2 diabetes mellitus without complications: Secondary | ICD-10-CM | POA: Diagnosis not present

## 2013-09-24 MED ORDER — LEVETIRACETAM 500 MG PO TABS: 500.0000 mg | ORAL_TABLET | Freq: Two times a day (BID) | ORAL | Status: DC

## 2013-09-24 MED ORDER — METOPROLOL TARTRATE 50 MG PO TABS: 75.0000 mg | ORAL_TABLET | Freq: Two times a day (BID) | ORAL | Status: DC

## 2013-09-24 MED ORDER — BISMUTH SUBSALICYLATE 262 MG/15ML PO SUSP: 30.0000 mL | Freq: Three times a day (TID) | ORAL | Status: DC

## 2013-09-24 MED ORDER — FLECAINIDE ACETATE 50 MG PO TABS: 50.0000 mg | ORAL_TABLET | Freq: Two times a day (BID) | ORAL | Status: DC

## 2013-09-24 MED ORDER — METHOTREXATE 2.5 MG PO TABS: 10.0000 mg | ORAL_TABLET | ORAL | Status: DC

## 2013-09-24 MED ORDER — METFORMIN HCL ER 500 MG PO TB24: ORAL_TABLET | ORAL | Status: DC

## 2013-09-24 MED ORDER — DICYCLOMINE HCL 10 MG PO CAPS: 10.0000 mg | ORAL_CAPSULE | Freq: Three times a day (TID) | ORAL | Status: DC

## 2013-09-24 MED ORDER — CALCIUM CITRATE-VITAMIN D 200-250 MG-UNIT PO TABS: 2.0000 | ORAL_TABLET | Freq: Every morning | ORAL | Status: DC

## 2013-09-24 MED ORDER — RIVAROXABAN 20 MG PO TABS: ORAL_TABLET | ORAL | Status: DC

## 2013-09-24 MED ORDER — POTASSIUM CHLORIDE CRYS ER 20 MEQ PO TBCR: 40.0000 meq | EXTENDED_RELEASE_TABLET | Freq: Two times a day (BID) | ORAL | Status: DC

## 2013-09-24 MED ORDER — FLUTICASONE PROPIONATE 50 MCG/ACT NA SUSP: 2.0000 | Freq: Every day | NASAL | Status: DC

## 2013-09-24 MED ORDER — MIRTAZAPINE 15 MG PO TABS: ORAL_TABLET | ORAL | Status: DC

## 2013-09-24 MED ORDER — SIMVASTATIN 40 MG PO TABS: 40.0000 mg | ORAL_TABLET | Freq: Every day | ORAL | Status: DC

## 2013-09-24 MED ORDER — FEXOFENADINE HCL 180 MG PO TABS: 180.0000 mg | ORAL_TABLET | Freq: Every day | ORAL | Status: DC

## 2013-09-24 MED ORDER — LEVOTHYROXINE SODIUM 88 MCG PO TABS: ORAL_TABLET | ORAL | Status: DC

## 2013-09-24 MED ORDER — ASPIRIN 81 MG PO TABS: 81.0000 mg | ORAL_TABLET | Freq: Every day | ORAL | Status: DC

## 2013-09-24 MED ORDER — LANSOPRAZOLE 30 MG PO CPDR: 30.0000 mg | DELAYED_RELEASE_CAPSULE | Freq: Every day | ORAL | Status: DC

## 2013-09-24 MED ORDER — NYSTATIN-TRIAMCINOLONE 100000-0.1 UNIT/GM-% EX CREA: 1.0000 "application " | TOPICAL_CREAM | Freq: Two times a day (BID) | CUTANEOUS | Status: DC

## 2013-09-25 DIAGNOSIS — E119 Type 2 diabetes mellitus without complications: Secondary | ICD-10-CM | POA: Diagnosis not present

## 2013-09-25 DIAGNOSIS — F329 Major depressive disorder, single episode, unspecified: Secondary | ICD-10-CM | POA: Diagnosis not present

## 2013-09-25 DIAGNOSIS — R1310 Dysphagia, unspecified: Secondary | ICD-10-CM | POA: Diagnosis not present

## 2013-09-25 DIAGNOSIS — I69991 Dysphagia following unspecified cerebrovascular disease: Secondary | ICD-10-CM | POA: Diagnosis not present

## 2013-09-25 DIAGNOSIS — I69959 Hemiplegia and hemiparesis following unspecified cerebrovascular disease affecting unspecified side: Secondary | ICD-10-CM | POA: Diagnosis not present

## 2013-09-25 DIAGNOSIS — I4891 Unspecified atrial fibrillation: Secondary | ICD-10-CM | POA: Diagnosis not present

## 2013-09-25 DIAGNOSIS — F3289 Other specified depressive episodes: Secondary | ICD-10-CM | POA: Diagnosis not present

## 2013-09-26 DIAGNOSIS — I69991 Dysphagia following unspecified cerebrovascular disease: Secondary | ICD-10-CM | POA: Diagnosis not present

## 2013-09-26 DIAGNOSIS — R1310 Dysphagia, unspecified: Secondary | ICD-10-CM | POA: Diagnosis not present

## 2013-09-26 DIAGNOSIS — I69959 Hemiplegia and hemiparesis following unspecified cerebrovascular disease affecting unspecified side: Secondary | ICD-10-CM | POA: Diagnosis not present

## 2013-09-26 DIAGNOSIS — F329 Major depressive disorder, single episode, unspecified: Secondary | ICD-10-CM | POA: Diagnosis not present

## 2013-09-26 DIAGNOSIS — I4891 Unspecified atrial fibrillation: Secondary | ICD-10-CM | POA: Diagnosis not present

## 2013-09-26 DIAGNOSIS — F3289 Other specified depressive episodes: Secondary | ICD-10-CM | POA: Diagnosis not present

## 2013-09-26 DIAGNOSIS — E119 Type 2 diabetes mellitus without complications: Secondary | ICD-10-CM | POA: Diagnosis not present

## 2013-09-29 ENCOUNTER — Ambulatory Visit (INDEPENDENT_AMBULATORY_CARE_PROVIDER_SITE_OTHER): Payer: Medicare Other | Admitting: Family Medicine

## 2013-09-29 ENCOUNTER — Encounter: Payer: Self-pay | Admitting: Family Medicine

## 2013-09-29 VITALS — BP 121/71 | HR 64 | Temp 98.4°F | Ht 64.0 in

## 2013-09-29 DIAGNOSIS — R634 Abnormal weight loss: Secondary | ICD-10-CM

## 2013-09-29 DIAGNOSIS — R5383 Other fatigue: Secondary | ICD-10-CM

## 2013-09-29 DIAGNOSIS — R5381 Other malaise: Secondary | ICD-10-CM

## 2013-09-29 DIAGNOSIS — B370 Candidal stomatitis: Secondary | ICD-10-CM

## 2013-09-29 DIAGNOSIS — R569 Unspecified convulsions: Secondary | ICD-10-CM | POA: Diagnosis not present

## 2013-09-29 DIAGNOSIS — I6350 Cerebral infarction due to unspecified occlusion or stenosis of unspecified cerebral artery: Secondary | ICD-10-CM | POA: Diagnosis not present

## 2013-09-29 DIAGNOSIS — F329 Major depressive disorder, single episode, unspecified: Secondary | ICD-10-CM

## 2013-09-29 DIAGNOSIS — Z79899 Other long term (current) drug therapy: Secondary | ICD-10-CM | POA: Diagnosis not present

## 2013-09-29 DIAGNOSIS — R7989 Other specified abnormal findings of blood chemistry: Secondary | ICD-10-CM

## 2013-09-29 DIAGNOSIS — F32A Depression, unspecified: Secondary | ICD-10-CM

## 2013-09-29 DIAGNOSIS — F3289 Other specified depressive episodes: Secondary | ICD-10-CM

## 2013-09-29 LAB — POCT WET PREP WITH KOH
Clue Cells Wet Prep HPF POC: NEGATIVE
KOH Prep POC: POSITIVE
Trichomonas, UA: NEGATIVE
Yeast Wet Prep HPF POC: POSITIVE

## 2013-09-29 LAB — POCT CBC
Granulocyte percent: 61.5 %G (ref 37–80)
HCT, POC: 43.1 % (ref 37.7–47.9)
Hemoglobin: 13.1 g/dL (ref 12.2–16.2)
Lymph, poc: 2.5 (ref 0.6–3.4)
MCH, POC: 27.6 pg (ref 27–31.2)
MCHC: 30.5 g/dL — AB (ref 31.8–35.4)
MCV: 90.3 fL (ref 80–97)
MPV: 7.7 fL (ref 0–99.8)
POC Granulocyte: 4.9 (ref 2–6.9)
POC LYMPH PERCENT: 31.8 %L (ref 10–50)
Platelet Count, POC: 442 10*3/uL — AB (ref 142–424)
RBC: 4.8 M/uL (ref 4.04–5.48)
RDW, POC: 16.9 %
WBC: 7.9 10*3/uL (ref 4.6–10.2)

## 2013-09-29 MED ORDER — NYSTATIN 100000 UNIT/ML MT SUSP: 5.0000 mL | Freq: Four times a day (QID) | OROMUCOSAL | Status: DC

## 2013-09-29 NOTE — Patient Instructions (Addendum)
Have Dr Jeffie Pollock check vaginal discharge for yeast while at visit tomorrow. Increase activity and take boost for nourishment And be POSITIVE about everything. Return to office in about 2 months  Use nystatin suspension after meals and at bedtime.

## 2013-09-29 NOTE — Progress Notes (Signed)
Subjective:    Patient ID: [name], female    DOB: 18-Oct-1945, 68 y.o.   MRN: [id number]  HPI Patient here today for 4 week recheck. She is accompanied today by her husband. The patient is complaining of mouth irritation. She has diminished appetite and decreased weight. She is currently taking Macrodantin for urinary tract infection. She has an appointment with the urologist tomorrow do to leaking around her catheter. Her blood sugars at home have been better. Her blood pressures at home have been also good. The husband is with her and she is in her wheelchair. She is getting physical therapy at home according to her husband and is improving with physical therapy. She has seen a neurologist and he plans to see her back again this month. She is still getting home health care and she has an in-home assistant that is working with her also. She appears somewhat depressed but is continually being encouraged by her husband to keep trying hard in order to get better.       Patient Active Problem List   Diagnosis Date Noted  . Cerebral infarction due to embolism of other cerebral artery 09/03/2013  . Metabolic syndrome 03/13/2013  . High risk medication use 03/13/2013  . Fever, unspecified 06/05/2012  . Generalized abdominal pain 06/05/2012  . PAF (paroxysmal atrial fibrillation) 02/07/2012  . Tachy-brady syndrome   . Impaired glucose tolerance   . FATIGUE 12/22/2009  . SNORING 12/22/2009  . OTHER CHRONIC NONALCOHOLIC LIVER DISEASE 10/29/2009  . NONSPEC ELEVATION OF LEVELS OF TRANSAMINASE/LDH 03/09/2009  . PPM-St.Jude 09/01/2008  . BRADYCARDIA 08/13/2008  . HYPOTHYROIDISM 06/09/2008  . HYPERTENSION 06/09/2008  . Atrial fibrillation 06/09/2008  . PREMATURE VENTRICULAR CONTRACTIONS 06/09/2008  . IRRITABLE BOWEL SYNDROME 06/09/2008  . PSORIASIS 06/09/2008  . EDEMA 06/09/2008  . DIVERTICULOSIS, COLON, HX OF 06/09/2008   Outpatient Encounter Prescriptions as of 09/29/2013  Medication  Sig  . acetaminophen (TYLENOL) 160 MG/5ML solution 650 mg as needed.  Marland Kitchen aspirin 81 MG tablet Take 1 tablet (81 mg total) by mouth daily.  Marland Kitchen bismuth subsalicylate (PEPTO BISMOL) 262 MG/15ML suspension Take 30 mLs by mouth 4 (four) times daily -  before meals and at bedtime.  . Calcium Citrate-Vitamin D (CITRACAL PETITES/VITAMIN D) 200-250 MG-UNIT TABS Take 2 tablets by mouth every morning.  . dicyclomine (BENTYL) 10 MG capsule Take 1 capsule (10 mg total) by mouth 4 (four) times daily -  before meals and at bedtime.  . fexofenadine (ALLEGRA) 180 MG tablet Take 1 tablet (180 mg total) by mouth daily.  . flecainide (TAMBOCOR) 50 MG tablet Take 1 tablet (50 mg total) by mouth 2 (two) times daily.  . fluticasone (FLONASE) 50 MCG/ACT nasal spray Place 2 sprays into both nostrils daily.  . lansoprazole (PREVACID) 30 MG capsule Take 1 capsule (30 mg total) by mouth daily at 12 noon.  . levETIRAcetam (KEPPRA) 500 MG tablet Take 1 tablet (500 mg total) by mouth 2 (two) times daily.  Marland Kitchen levothyroxine (SYNTHROID, LEVOTHROID) 88 MCG tablet TAKE 1 TABLET BY MOUTH DAILY BEFORE BREAKFAST  . loperamide (IMODIUM) 2 MG capsule Take by mouth as needed for diarrhea or loose stools.  . metFORMIN (GLUCOPHAGE-XR) 500 MG 24 hr tablet TAKE 1 TABLET BY MOUTH EVERY DAY WITH BREAKFAST  . methotrexate (RHEUMATREX) 2.5 MG tablet Take 4 tablets (10 mg total) by mouth once a week. Caution:Chemotherapy. Protect from light.  . metoprolol (LOPRESSOR) 50 MG tablet Take 1.5 tablets (75 mg total) by mouth 2 (  two) times daily. 1 1/2 tab bid  . mirtazapine (REMERON) 15 MG tablet 1/2 tab po qd  . nystatin-triamcinolone (MYCOLOG II) cream Apply 1 application topically 2 (two) times daily.  . potassium chloride SA (K-DUR,KLOR-CON) 20 MEQ tablet Take 2 tablets (40 mEq total) by mouth 2 (two) times daily.  . predniSONE (DELTASONE) 20 MG tablet Take 20 mg by mouth as needed. gout  . rivaroxaban (XARELTO) 20 MG TABS tablet TAKE ONE TABLET BY  MOUTH EVERY DAY  . simvastatin (ZOCOR) 40 MG tablet Take 1 tablet (40 mg total) by mouth daily.  . traMADol (ULTRAM) 50 MG tablet Take 1 tablet (50 mg total) by mouth every 8 (eight) hours as needed for pain.  . [DISCONTINUED] dronabinol (MARINOL) 2.5 MG capsule Take 10 mg by mouth 2 (two) times daily before lunch and supper.  . [DISCONTINUED] nitrofurantoin, macrocrystal-monohydrate, (MACROBID) 100 MG capsule Take 100 mg by mouth 2 (two) times daily.    Review of Systems  Constitutional: Positive for appetite change (not eating well- says her mouth hurts as well) and unexpected weight change (decrease in weight).  HENT: Negative.   Eyes: Negative.   Respiratory: Negative.   Cardiovascular: Negative.   Gastrointestinal: Negative.   Endocrine: Negative.   Genitourinary: Negative.        Leaking around catheter- going to urologist [facility] tomorrow.  Musculoskeletal: Negative.   Skin: Negative.   Allergic/Immunologic: Negative.   Neurological: Negative.   Hematological: Negative.   Psychiatric/Behavioral: Negative.        Objective:   Physical Exam  Nursing note and vitals reviewed. Constitutional: She is oriented to person, place, and time. She appears distressed.  Patient is thin and frail and obviously depressed about her condition  HENT:  Head: Normocephalic and atraumatic.  Right Ear: External ear normal.  Left Ear: External ear normal.  Nose: Nose normal.  Mouth/Throat: Oropharynx is clear and moist. No oropharyngeal exudate.  A KOH prep was done but the results of this was not done before the patient left the office  Eyes: Conjunctivae and EOM are normal. Pupils are equal, round, and reactive to light. Right eye exhibits no discharge. Left eye exhibits no discharge. No scleral icterus.  Neck: Normal range of motion. Neck supple. No thyromegaly present.  Cardiovascular: Normal rate, regular rhythm, normal heart sounds and intact distal pulses.  Exam reveals no gallop and no  friction rub.   No murmur heard. The rhythm was regular at 72 per minute  Pulmonary/Chest: Effort normal and breath sounds normal. No respiratory distress. She has no wheezes. She has no rales. She exhibits no tenderness.  Abdominal: Soft. Bowel sounds are normal. She exhibits no mass. There is no tenderness. There is no rebound and no guarding.  Musculoskeletal: She exhibits no edema.  The range of motion was not assessed as the patient was in a wheelchair. She does have diminished use of the right arm.  Lymphadenopathy:    She has no cervical adenopathy.  Neurological: She is alert and oriented to person, place, and time. She has normal reflexes. No cranial nerve deficit.  Skin: Skin is warm and dry. No rash noted. No pallor.  Psychiatric: Her behavior is normal. Judgment and thought content normal.  Mood and affect were depressed    BP 121/71  Pulse 64  Temp(Src) 98.4 F (36.9 C) (Oral)  Ht $R'5\' 4"'xV$  (1.626 m)       Assessment & Plan:  1. Other convulsions - Levetiracetam level - POCT CBC -  BMP8+EGFR - Hepatic function panel  2. Unspecified cerebral artery occlusion with cerebral infarction - Levetiracetam level - POCT CBC - BMP8+EGFR - Hepatic function panel  3. Thrush, oral - POCT Wet Prep with KOH - POCT CBC - BMP8+EGFR - Hepatic function panel  4. High risk medication use  5. Fatigue due to depression and recent CVA  Meds ordered this encounter  Medications  . nystatin (MYCOSTATIN) 100000 UNIT/ML suspension    Sig: Take 5 mLs (500,000 Units total) by mouth 4 (four) times daily. Swish and swallow    Dispense:  60 mL    Refill:  1     Patient Instructions  Have Dr Jeffie Pollock check vaginal discharge for yeast while at visit tomorrow. Increase activity and take boost for nourishment And be POSITIVE about everything. Return to office in about 2 months  Use nystatin suspension after meals and at bedtime.    Arrie Senate MD

## 2013-09-30 ENCOUNTER — Telehealth: Payer: Self-pay | Admitting: Family Medicine

## 2013-09-30 ENCOUNTER — Other Ambulatory Visit: Payer: Self-pay | Admitting: Nurse Practitioner

## 2013-09-30 DIAGNOSIS — R32 Unspecified urinary incontinence: Secondary | ICD-10-CM | POA: Diagnosis not present

## 2013-09-30 DIAGNOSIS — F3289 Other specified depressive episodes: Secondary | ICD-10-CM | POA: Diagnosis not present

## 2013-09-30 DIAGNOSIS — I69959 Hemiplegia and hemiparesis following unspecified cerebrovascular disease affecting unspecified side: Secondary | ICD-10-CM | POA: Diagnosis not present

## 2013-09-30 DIAGNOSIS — N319 Neuromuscular dysfunction of bladder, unspecified: Secondary | ICD-10-CM | POA: Diagnosis not present

## 2013-09-30 DIAGNOSIS — R1310 Dysphagia, unspecified: Secondary | ICD-10-CM | POA: Diagnosis not present

## 2013-09-30 DIAGNOSIS — I4891 Unspecified atrial fibrillation: Secondary | ICD-10-CM | POA: Diagnosis not present

## 2013-09-30 DIAGNOSIS — N390 Urinary tract infection, site not specified: Secondary | ICD-10-CM | POA: Diagnosis not present

## 2013-09-30 DIAGNOSIS — I69991 Dysphagia following unspecified cerebrovascular disease: Secondary | ICD-10-CM | POA: Diagnosis not present

## 2013-09-30 DIAGNOSIS — F329 Major depressive disorder, single episode, unspecified: Secondary | ICD-10-CM | POA: Diagnosis not present

## 2013-09-30 DIAGNOSIS — E119 Type 2 diabetes mellitus without complications: Secondary | ICD-10-CM | POA: Diagnosis not present

## 2013-09-30 LAB — HEPATIC FUNCTION PANEL
ALT: 36 IU/L — ABNORMAL HIGH (ref 0–32)
AST: 59 IU/L — ABNORMAL HIGH (ref 0–40)
Albumin: 4.2 g/dL (ref 3.6–4.8)
Alkaline Phosphatase: 85 IU/L (ref 39–117)
Bilirubin, Direct: 0.11 mg/dL (ref 0.00–0.40)
Total Bilirubin: 0.3 mg/dL (ref 0.0–1.2)
Total Protein: 6.8 g/dL (ref 6.0–8.5)

## 2013-09-30 LAB — BMP8+EGFR
BUN/Creatinine Ratio: 11 (ref 11–26)
BUN: 7 mg/dL — ABNORMAL LOW (ref 8–27)
CO2: 19 mmol/L (ref 18–29)
Calcium: 9.7 mg/dL (ref 8.7–10.3)
Chloride: 98 mmol/L (ref 97–108)
Creatinine, Ser: 0.66 mg/dL (ref 0.57–1.00)
GFR calc Af Amer: 106 mL/min/{1.73_m2} (ref >59)
GFR calc non Af Amer: 92 mL/min/{1.73_m2} (ref >59)
Glucose: 113 mg/dL — ABNORMAL HIGH (ref 65–99)
Potassium: 3.7 mmol/L (ref 3.5–5.2)
Sodium: 138 mmol/L (ref 134–144)

## 2013-09-30 LAB — POCT GLYCOSYLATED HEMOGLOBIN (HGB A1C): Hemoglobin A1C: 4.7

## 2013-09-30 NOTE — Telephone Encounter (Signed)
Pt caregiver given lab results.  Care giver  Wants to pick up copy of results this afternoon to take to upcoming dr appt -neurologist in [place].

## 2013-09-30 NOTE — Telephone Encounter (Signed)
Message copied by Waverly Ferrari on Tue Sep 30, 2013 10:00 AM ------      Message from: Chipper Herb      Created: Mon Sep 29, 2013  6:22 PM       The CBC has a normal white blood cell count. The hemoglobin is good at 13.1. The platelet count is  increased from previous readings----we will continue to monitor this.      On the KOH wet prep from the mouth there was yeast present----treatment has been prescribed and is to be used as directed ------

## 2013-09-30 NOTE — Addendum Note (Signed)
Addended by: Pollyann Kennedy F on: 09/30/2013 01:14 PM   Modules accepted: Orders

## 2013-09-30 NOTE — Telephone Encounter (Signed)
Please give enough for one month with one refill

## 2013-09-30 NOTE — Telephone Encounter (Signed)
Copy up front for patient to pick up.

## 2013-09-30 NOTE — Telephone Encounter (Signed)
Called husband- we did not orig write this rx- he will call me back on old bottle direction and then we can contact pharm

## 2013-10-01 ENCOUNTER — Encounter: Payer: Self-pay | Admitting: Internal Medicine

## 2013-10-01 ENCOUNTER — Ambulatory Visit (INDEPENDENT_AMBULATORY_CARE_PROVIDER_SITE_OTHER): Payer: Medicare Other | Admitting: Internal Medicine

## 2013-10-01 VITALS — BP 155/82 | HR 66 | Ht 64.0 in

## 2013-10-01 DIAGNOSIS — I4891 Unspecified atrial fibrillation: Secondary | ICD-10-CM

## 2013-10-01 DIAGNOSIS — R1310 Dysphagia, unspecified: Secondary | ICD-10-CM | POA: Diagnosis not present

## 2013-10-01 DIAGNOSIS — F329 Major depressive disorder, single episode, unspecified: Secondary | ICD-10-CM | POA: Diagnosis not present

## 2013-10-01 DIAGNOSIS — I6350 Cerebral infarction due to unspecified occlusion or stenosis of unspecified cerebral artery: Secondary | ICD-10-CM

## 2013-10-01 DIAGNOSIS — I69959 Hemiplegia and hemiparesis following unspecified cerebrovascular disease affecting unspecified side: Secondary | ICD-10-CM | POA: Diagnosis not present

## 2013-10-01 DIAGNOSIS — I69991 Dysphagia following unspecified cerebrovascular disease: Secondary | ICD-10-CM | POA: Diagnosis not present

## 2013-10-01 DIAGNOSIS — I495 Sick sinus syndrome: Secondary | ICD-10-CM | POA: Diagnosis not present

## 2013-10-01 DIAGNOSIS — I4819 Other persistent atrial fibrillation: Secondary | ICD-10-CM

## 2013-10-01 DIAGNOSIS — F3289 Other specified depressive episodes: Secondary | ICD-10-CM | POA: Diagnosis not present

## 2013-10-01 DIAGNOSIS — E119 Type 2 diabetes mellitus without complications: Secondary | ICD-10-CM | POA: Diagnosis not present

## 2013-10-01 LAB — MDC_IDC_ENUM_SESS_TYPE_INCLINIC
Battery Remaining Longevity: 126 mo
Battery Voltage: 2.96 V
Brady Statistic RA Percent Paced: 65 %
Brady Statistic RV Percent Paced: 7.5 %
Date Time Interrogation Session: 20150812152049
Implantable Pulse Generator Model: 2110
Implantable Pulse Generator Serial Number: 2285953
Lead Channel Impedance Value: 375 Ohm
Lead Channel Impedance Value: 400 Ohm
Lead Channel Pacing Threshold Amplitude: 1 V
Lead Channel Pacing Threshold Amplitude: 1 V
Lead Channel Pacing Threshold Amplitude: 1.25 V
Lead Channel Pacing Threshold Amplitude: 1.25 V
Lead Channel Pacing Threshold Pulse Width: 0.4 ms
Lead Channel Pacing Threshold Pulse Width: 0.4 ms
Lead Channel Pacing Threshold Pulse Width: 0.8 ms
Lead Channel Pacing Threshold Pulse Width: 0.8 ms
Lead Channel Sensing Intrinsic Amplitude: 3.7 mV
Lead Channel Sensing Intrinsic Amplitude: 4.5 mV
Lead Channel Setting Pacing Amplitude: 2 V
Lead Channel Setting Pacing Amplitude: 2.5 V
Lead Channel Setting Pacing Pulse Width: 0.8 ms
Lead Channel Setting Sensing Sensitivity: 1.5 mV

## 2013-10-01 LAB — LEVETIRACETAM LEVEL: Levetiracetam Lvl: 21 ug/mL (ref 10.0–40.0)

## 2013-10-01 MED ORDER — POTASSIUM CHLORIDE 2 MEQ/ML FOR ORAL USE
ORAL | Status: DC
Start: 2013-10-01 — End: 2013-10-03

## 2013-10-01 NOTE — Progress Notes (Signed)
PCP: Redge Gainer, MD Primary Cardiologist:  Dr Criss Rosales is a 68 y.o. female who presents today for routine electrophysiology followup.  She has had a large stroke as complication from her convergent afib ablation procedure at [facility] 2/15.  She continues to make very slow recovery.  Her husband provides history today.  She continues outpatient rehab and speech path.  She is taking POs now and apparently does not use her PEG tube which was placed on at [facility].  She has recently been seen by Dr Jeffie Pollock who is working with her regarding her chronic foley. She is maintaining sinus rhythm. Today, she denies symptoms of chest pain, shortness of breath, dizziness, presyncope, or syncope.    The patient is otherwise without complaint today.   Past Medical History  Diagnosis Date  . Impaired glucose tolerance   . Psoriasis   . IBS (irritable bowel syndrome)     vs diarrhea vs abd. fullness   . Chronic anxiety   . Uterine prolapse   . Bronchial spasms   . Hypertension   . Esophageal stricture   . Elevated blood sugar   . Diverticulosis   . Rectocele, female   . History of bladder repair surgery   . Hx of vaginal hysterectomy   . Vaginal prolapse 1998  . Hypothyroidism   . Psoriasis   . Tachy-brady syndrome     a. 08/19/2008 s/p PPM: SJM 2110 Accent  . Complication of anesthesia     " I shake real bad "  . Family history of anesthesia complication     Daughter also shakes while waking up  . GERD (gastroesophageal reflux disease)   . H/O hiatal hernia   . Neuromuscular disorder     periferal neuropathy  . Anemia   . Paroxysmal atrial fibrillation     a. failed flecainide, tikosyn, amio;  b. 01/2012 s/p RFCA.  . H/O scarlet fever   . Pacemaker     St. Jude   Past Surgical History  Procedure Laterality Date  . Vaginal hysterectomy      prolapse   . Bladder suspension    . Rectocele repair    . Transthoracic echocardiogram  2008  . Tee without cardioversion  02/06/2012   Procedure: TRANSESOPHAGEAL ECHOCARDIOGRAM (TEE);  Surgeon: Thayer Headings, MD;  Location[facility] ENDOSCOPY;  Service: Cardiovascular;  Laterality: N/A;  . Atrial fibrillation ablation  02/06/12    PVI by Dr Rayann Heman  . Insert / replace / remove pacemaker      SJM  . Atrial fibrillation ablation  07/04/2012    repeat PVI by Dr Rayann Heman  . Tee without cardioversion N/A 07/04/2012    Procedure: TRANSESOPHAGEAL ECHOCARDIOGRAM (TEE);  Surgeon: Lelon Perla, MD;  Location[facility] ENDOSCOPY;  Service: Cardiovascular;  Laterality: N/A;   ROS- all systems reviewed- see ROS sheet  Current Outpatient Prescriptions  Medication Sig Dispense Refill  . acetaminophen (TYLENOL) 160 MG/5ML solution 650 mg as needed.      Marland Kitchen aspirin 81 MG tablet Take 1 tablet (81 mg total) by mouth daily.  30 tablet  4  . bismuth subsalicylate (PEPTO BISMOL) 262 MG/15ML suspension Take 30 mLs by mouth 4 (four) times daily -  before meals and at bedtime.  120 mL  4  . Calcium Citrate-Vitamin D (CITRACAL PETITES/VITAMIN D) 200-250 MG-UNIT TABS Take 2 tablets by mouth every morning.  60 tablet  5  . dicyclomine (BENTYL) 10 MG capsule Take 1 capsule (10 mg total)  by mouth 4 (four) times daily -  before meals and at bedtime.  120 capsule  5  . fexofenadine (ALLEGRA) 180 MG tablet Take 1 tablet (180 mg total) by mouth daily.  30 tablet  5  . flecainide (TAMBOCOR) 50 MG tablet Take 1 tablet (50 mg total) by mouth 2 (two) times daily.  60 tablet  5  . fluticasone (FLONASE) 50 MCG/ACT nasal spray Place 2 sprays into both nostrils daily.  16 g  5  . lansoprazole (PREVACID) 30 MG capsule Take 1 capsule (30 mg total) by mouth daily at 12 noon.  30 capsule  5  . levETIRAcetam (KEPPRA) 500 MG tablet Take 1 tablet (500 mg total) by mouth 2 (two) times daily.  60 tablet  2  . levothyroxine (SYNTHROID, LEVOTHROID) 88 MCG tablet TAKE 1 TABLET BY MOUTH DAILY BEFORE BREAKFAST  30 tablet  10  . loperamide (IMODIUM) 2 MG capsule Take by mouth as needed for  diarrhea or loose stools.      . metFORMIN (GLUCOPHAGE-XR) 500 MG 24 hr tablet TAKE 1 TABLET BY MOUTH EVERY DAY WITH BREAKFAST  30 tablet  5  . methotrexate (RHEUMATREX) 2.5 MG tablet Take 4 tablets (10 mg total) by mouth once a week. Caution:Chemotherapy. Protect from light.  20 tablet  2  . metoprolol (LOPRESSOR) 50 MG tablet Take 1.5 tablets (75 mg total) by mouth 2 (two) times daily. 1 1/2 tab bid  45 tablet  5  . mirtazapine (REMERON) 15 MG tablet 1/2 tab po qd  30 tablet  3  . nystatin (MYCOSTATIN) 100000 UNIT/ML suspension Take 5 mLs (500,000 Units total) by mouth 4 (four) times daily. Swish and swallow  60 mL  1  . nystatin-triamcinolone (MYCOLOG II) cream Apply 1 application topically 2 (two) times daily.  30 g  2  . potassium chloride SA (K-DUR,KLOR-CON) 20 MEQ tablet Take 2 tablets (40 mEq total) by mouth 2 (two) times daily.  120 tablet  5  . predniSONE (DELTASONE) 20 MG tablet Take 20 mg by mouth as needed. gout      . rivaroxaban (XARELTO) 20 MG TABS tablet TAKE ONE TABLET BY MOUTH EVERY DAY  30 tablet  3  . simvastatin (ZOCOR) 40 MG tablet Take 1 tablet (40 mg total) by mouth daily.  30 tablet  5  . traMADol (ULTRAM) 50 MG tablet Take 1 tablet (50 mg total) by mouth every 8 (eight) hours as needed for pain.  50 tablet  0  . potassium chloride (KCL) 2 mEq/mL SOLN oral liquid 2 tsp per feeding tube daily  500 mL  1   No current facility-administered medications for this visit.    Physical Exam: Filed Vitals:   10/01/13 1430  BP: 155/82  Pulse: 66  Height: 5\' 4"  (1.626 m)    GEN- The patient is chronically ill appearing, alert and in a wheelchair today,  She has had profound decline since I saw her last  Head- normocephalic, atraumatic Eyes-  Sclera clear, conjunctiva pink Ears- hearing intact Oropharynx- clear Lungs- Clear to ausculation bilaterally, normal work of breathing Chest- pacemaker pocket is well healed Heart- Regular rate and rhythm, no murmurs, rubs or  gallops, PMI not laterally displaced GI- soft, NT, ND, + BS Extremities- no clubbing, cyanosis, or edema Neuro- expressive aphasia, L sided weakness is significant  Pacemaker interrogation- reviewed in detail today,  See PACEART report  Assessment and Plan:  1. Paroxysmal atrial fibrillation Maintaining sinus rhythm No changes today  2. Sick sinus syndrome/ symptomatic bradycardia Normal pacemaker function See Pace Art report No changes today  3. Advanced deficit s/p stroke She continues to make slow progress She is followed by neurology and is receiving outpatient rehab I have encouraged her to follow-up with [facility] about timing of PEG tube removal (as it was placed there). She has not seen Dr Chinita Pester team since her ablation but will likely try to see them once she is able to travel that far.  Return in 6 weeks

## 2013-10-01 NOTE — Addendum Note (Signed)
Addended by: Chevis Pretty on: 10/01/2013 05:01 PM   Modules accepted: Orders

## 2013-10-01 NOTE — Telephone Encounter (Signed)
Leafy Ro spoke to husband and pt is on liquid.

## 2013-10-01 NOTE — Patient Instructions (Addendum)
Your physician recommends that you schedule a follow-up appointment in: 6 weeks with Dr. Rayann Heman.  Your physician recommends that you continue on your current medications as directed. Please refer to the Current Medication list given to you today.

## 2013-10-01 NOTE — Telephone Encounter (Signed)
Can you review dose and make sure this is accurate. Thanks

## 2013-10-01 NOTE — Telephone Encounter (Signed)
Please call patient and find out if on pills or liquid

## 2013-10-02 ENCOUNTER — Telehealth: Payer: Self-pay | Admitting: Nurse Practitioner

## 2013-10-02 DIAGNOSIS — F3289 Other specified depressive episodes: Secondary | ICD-10-CM | POA: Diagnosis not present

## 2013-10-02 DIAGNOSIS — I69959 Hemiplegia and hemiparesis following unspecified cerebrovascular disease affecting unspecified side: Secondary | ICD-10-CM | POA: Diagnosis not present

## 2013-10-02 DIAGNOSIS — N390 Urinary tract infection, site not specified: Secondary | ICD-10-CM | POA: Diagnosis not present

## 2013-10-02 DIAGNOSIS — F329 Major depressive disorder, single episode, unspecified: Secondary | ICD-10-CM | POA: Diagnosis not present

## 2013-10-02 DIAGNOSIS — E119 Type 2 diabetes mellitus without complications: Secondary | ICD-10-CM | POA: Diagnosis not present

## 2013-10-02 DIAGNOSIS — I4891 Unspecified atrial fibrillation: Secondary | ICD-10-CM | POA: Diagnosis not present

## 2013-10-02 DIAGNOSIS — I69991 Dysphagia following unspecified cerebrovascular disease: Secondary | ICD-10-CM | POA: Diagnosis not present

## 2013-10-02 DIAGNOSIS — R1310 Dysphagia, unspecified: Secondary | ICD-10-CM | POA: Diagnosis not present

## 2013-10-03 ENCOUNTER — Other Ambulatory Visit: Payer: Self-pay | Admitting: *Deleted

## 2013-10-03 DIAGNOSIS — F3289 Other specified depressive episodes: Secondary | ICD-10-CM | POA: Diagnosis not present

## 2013-10-03 DIAGNOSIS — I4891 Unspecified atrial fibrillation: Secondary | ICD-10-CM | POA: Diagnosis not present

## 2013-10-03 DIAGNOSIS — I69991 Dysphagia following unspecified cerebrovascular disease: Secondary | ICD-10-CM | POA: Diagnosis not present

## 2013-10-03 DIAGNOSIS — E119 Type 2 diabetes mellitus without complications: Secondary | ICD-10-CM | POA: Diagnosis not present

## 2013-10-03 DIAGNOSIS — R1310 Dysphagia, unspecified: Secondary | ICD-10-CM | POA: Diagnosis not present

## 2013-10-03 DIAGNOSIS — I69959 Hemiplegia and hemiparesis following unspecified cerebrovascular disease affecting unspecified side: Secondary | ICD-10-CM | POA: Diagnosis not present

## 2013-10-03 DIAGNOSIS — F329 Major depressive disorder, single episode, unspecified: Secondary | ICD-10-CM | POA: Diagnosis not present

## 2013-10-03 MED ORDER — POTASSIUM CHLORIDE 20 MEQ/15ML (10%) PO LIQD: 20.0000 meq | Freq: Every day | ORAL | Status: DC

## 2013-10-04 DIAGNOSIS — F329 Major depressive disorder, single episode, unspecified: Secondary | ICD-10-CM | POA: Diagnosis not present

## 2013-10-04 DIAGNOSIS — R1310 Dysphagia, unspecified: Secondary | ICD-10-CM | POA: Diagnosis not present

## 2013-10-04 DIAGNOSIS — I69991 Dysphagia following unspecified cerebrovascular disease: Secondary | ICD-10-CM | POA: Diagnosis not present

## 2013-10-04 DIAGNOSIS — F3289 Other specified depressive episodes: Secondary | ICD-10-CM | POA: Diagnosis not present

## 2013-10-04 DIAGNOSIS — E119 Type 2 diabetes mellitus without complications: Secondary | ICD-10-CM | POA: Diagnosis not present

## 2013-10-04 DIAGNOSIS — I69959 Hemiplegia and hemiparesis following unspecified cerebrovascular disease affecting unspecified side: Secondary | ICD-10-CM | POA: Diagnosis not present

## 2013-10-04 DIAGNOSIS — I4891 Unspecified atrial fibrillation: Secondary | ICD-10-CM | POA: Diagnosis not present

## 2013-10-06 DIAGNOSIS — F3289 Other specified depressive episodes: Secondary | ICD-10-CM | POA: Diagnosis not present

## 2013-10-06 DIAGNOSIS — I69959 Hemiplegia and hemiparesis following unspecified cerebrovascular disease affecting unspecified side: Secondary | ICD-10-CM | POA: Diagnosis not present

## 2013-10-06 DIAGNOSIS — R1310 Dysphagia, unspecified: Secondary | ICD-10-CM | POA: Diagnosis not present

## 2013-10-06 DIAGNOSIS — E119 Type 2 diabetes mellitus without complications: Secondary | ICD-10-CM | POA: Diagnosis not present

## 2013-10-06 DIAGNOSIS — I69991 Dysphagia following unspecified cerebrovascular disease: Secondary | ICD-10-CM | POA: Diagnosis not present

## 2013-10-06 DIAGNOSIS — F329 Major depressive disorder, single episode, unspecified: Secondary | ICD-10-CM | POA: Diagnosis not present

## 2013-10-06 DIAGNOSIS — I4891 Unspecified atrial fibrillation: Secondary | ICD-10-CM | POA: Diagnosis not present

## 2013-10-07 ENCOUNTER — Telehealth: Payer: Self-pay | Admitting: *Deleted

## 2013-10-07 DIAGNOSIS — R1310 Dysphagia, unspecified: Secondary | ICD-10-CM | POA: Diagnosis not present

## 2013-10-07 DIAGNOSIS — F3289 Other specified depressive episodes: Secondary | ICD-10-CM | POA: Diagnosis not present

## 2013-10-07 DIAGNOSIS — F329 Major depressive disorder, single episode, unspecified: Secondary | ICD-10-CM | POA: Diagnosis not present

## 2013-10-07 DIAGNOSIS — I69991 Dysphagia following unspecified cerebrovascular disease: Secondary | ICD-10-CM | POA: Diagnosis not present

## 2013-10-07 DIAGNOSIS — I4891 Unspecified atrial fibrillation: Secondary | ICD-10-CM | POA: Diagnosis not present

## 2013-10-07 DIAGNOSIS — E119 Type 2 diabetes mellitus without complications: Secondary | ICD-10-CM | POA: Diagnosis not present

## 2013-10-07 DIAGNOSIS — I69959 Hemiplegia and hemiparesis following unspecified cerebrovascular disease affecting unspecified side: Secondary | ICD-10-CM | POA: Diagnosis not present

## 2013-10-07 NOTE — Telephone Encounter (Signed)
[name], in home nurse helping with patient is calling stating that she is concerned about the patient's depression.  She says that the last few times she has been in the home - the patient has been very tearful. She is now taking a 1/2 of 15mg  Remeron QHS Do you think we could increase this- or try something else?  Pt is not on any other meds that would help with depression.   Call stacy back and she will contact the pt's husband.

## 2013-10-07 NOTE — Telephone Encounter (Signed)
Tammy, do you have any suggestions for an add on for  depression in this patient

## 2013-10-08 DIAGNOSIS — I6350 Cerebral infarction due to unspecified occlusion or stenosis of unspecified cerebral artery: Secondary | ICD-10-CM | POA: Diagnosis not present

## 2013-10-08 DIAGNOSIS — I10 Essential (primary) hypertension: Secondary | ICD-10-CM | POA: Diagnosis not present

## 2013-10-08 DIAGNOSIS — R269 Unspecified abnormalities of gait and mobility: Secondary | ICD-10-CM | POA: Diagnosis not present

## 2013-10-08 DIAGNOSIS — R569 Unspecified convulsions: Secondary | ICD-10-CM | POA: Diagnosis not present

## 2013-10-08 DIAGNOSIS — F411 Generalized anxiety disorder: Secondary | ICD-10-CM | POA: Diagnosis not present

## 2013-10-09 DIAGNOSIS — R1310 Dysphagia, unspecified: Secondary | ICD-10-CM | POA: Diagnosis not present

## 2013-10-09 DIAGNOSIS — I69959 Hemiplegia and hemiparesis following unspecified cerebrovascular disease affecting unspecified side: Secondary | ICD-10-CM | POA: Diagnosis not present

## 2013-10-09 DIAGNOSIS — I69991 Dysphagia following unspecified cerebrovascular disease: Secondary | ICD-10-CM | POA: Diagnosis not present

## 2013-10-09 DIAGNOSIS — F3289 Other specified depressive episodes: Secondary | ICD-10-CM | POA: Diagnosis not present

## 2013-10-09 DIAGNOSIS — F329 Major depressive disorder, single episode, unspecified: Secondary | ICD-10-CM | POA: Diagnosis not present

## 2013-10-09 DIAGNOSIS — I4891 Unspecified atrial fibrillation: Secondary | ICD-10-CM | POA: Diagnosis not present

## 2013-10-09 DIAGNOSIS — E119 Type 2 diabetes mellitus without complications: Secondary | ICD-10-CM | POA: Diagnosis not present

## 2013-10-09 MED ORDER — MIRTAZAPINE 15 MG PO TABS: ORAL_TABLET | ORAL | Status: DC

## 2013-10-09 NOTE — Telephone Encounter (Signed)
Stacy called and aware

## 2013-10-09 NOTE — Telephone Encounter (Signed)
Recommend increase mirtazepine / Remeron 15mg  to 1 tablet daily and follow up with patient in 1 month.  Rx sent to pharmacy.   Please call nurse - Stacy with change and to notify patient's husdand

## 2013-10-10 DIAGNOSIS — I4891 Unspecified atrial fibrillation: Secondary | ICD-10-CM | POA: Diagnosis not present

## 2013-10-10 DIAGNOSIS — R1310 Dysphagia, unspecified: Secondary | ICD-10-CM | POA: Diagnosis not present

## 2013-10-10 DIAGNOSIS — I69959 Hemiplegia and hemiparesis following unspecified cerebrovascular disease affecting unspecified side: Secondary | ICD-10-CM | POA: Diagnosis not present

## 2013-10-10 DIAGNOSIS — F3289 Other specified depressive episodes: Secondary | ICD-10-CM | POA: Diagnosis not present

## 2013-10-10 DIAGNOSIS — E119 Type 2 diabetes mellitus without complications: Secondary | ICD-10-CM | POA: Diagnosis not present

## 2013-10-10 DIAGNOSIS — F329 Major depressive disorder, single episode, unspecified: Secondary | ICD-10-CM | POA: Diagnosis not present

## 2013-10-10 DIAGNOSIS — I69991 Dysphagia following unspecified cerebrovascular disease: Secondary | ICD-10-CM | POA: Diagnosis not present

## 2013-10-13 ENCOUNTER — Telehealth: Payer: Self-pay | Admitting: *Deleted

## 2013-10-13 DIAGNOSIS — R1310 Dysphagia, unspecified: Secondary | ICD-10-CM | POA: Diagnosis not present

## 2013-10-13 DIAGNOSIS — E119 Type 2 diabetes mellitus without complications: Secondary | ICD-10-CM | POA: Diagnosis not present

## 2013-10-13 DIAGNOSIS — I69959 Hemiplegia and hemiparesis following unspecified cerebrovascular disease affecting unspecified side: Secondary | ICD-10-CM | POA: Diagnosis not present

## 2013-10-13 DIAGNOSIS — F3289 Other specified depressive episodes: Secondary | ICD-10-CM | POA: Diagnosis not present

## 2013-10-13 DIAGNOSIS — I4891 Unspecified atrial fibrillation: Secondary | ICD-10-CM | POA: Diagnosis not present

## 2013-10-13 DIAGNOSIS — I69991 Dysphagia following unspecified cerebrovascular disease: Secondary | ICD-10-CM | POA: Diagnosis not present

## 2013-10-13 DIAGNOSIS — F329 Major depressive disorder, single episode, unspecified: Secondary | ICD-10-CM | POA: Diagnosis not present

## 2013-10-13 MED ORDER — CITALOPRAM HYDROBROMIDE 20 MG PO TABS: 20.0000 mg | ORAL_TABLET | Freq: Every day | ORAL | Status: DC

## 2013-10-13 NOTE — Telephone Encounter (Signed)
The patient's husband has not been spoken with yet. Please talk to him before these changes are made.

## 2013-10-13 NOTE — Telephone Encounter (Signed)
Recommend discontinue mirtazepine.  Start citalopram 20mg  1 tablet daily.  Recommend recheck BMET in 1 month to make sure sodium does not drop below 130.

## 2013-10-13 NOTE — Telephone Encounter (Signed)
This change has been made with the pharm, husband, and the home nurse

## 2013-10-13 NOTE — Telephone Encounter (Signed)
[name], we received a call from Home health nurse approx 1 week ago about pt possibly being depressed and tearful all of the time. At that time we increased her remeron from 0.5 tab (15mg ) qhs to 1 whole tab QHS. Dwm wants to know what other options we could try for pt - and he would like to D/C the remeron. Please review the pt's meds and let us know what you think would work well for her. thanks

## 2013-10-13 NOTE — Telephone Encounter (Signed)
Please follow through with this plan of action as per directions of the clinical pharmacist. Talked with the patient's husband about this also

## 2013-10-14 DIAGNOSIS — I69959 Hemiplegia and hemiparesis following unspecified cerebrovascular disease affecting unspecified side: Secondary | ICD-10-CM | POA: Diagnosis not present

## 2013-10-14 DIAGNOSIS — I69991 Dysphagia following unspecified cerebrovascular disease: Secondary | ICD-10-CM | POA: Diagnosis not present

## 2013-10-14 DIAGNOSIS — F329 Major depressive disorder, single episode, unspecified: Secondary | ICD-10-CM | POA: Diagnosis not present

## 2013-10-14 DIAGNOSIS — F3289 Other specified depressive episodes: Secondary | ICD-10-CM | POA: Diagnosis not present

## 2013-10-14 DIAGNOSIS — E119 Type 2 diabetes mellitus without complications: Secondary | ICD-10-CM | POA: Diagnosis not present

## 2013-10-14 DIAGNOSIS — R1310 Dysphagia, unspecified: Secondary | ICD-10-CM | POA: Diagnosis not present

## 2013-10-14 DIAGNOSIS — I4891 Unspecified atrial fibrillation: Secondary | ICD-10-CM | POA: Diagnosis not present

## 2013-10-14 NOTE — Telephone Encounter (Signed)
The phone call was to discuss depression not potassium.Home health nurse wanted to know if you wanted to treat due to urine specimen she sent [place]. Pt has seen Dr. Jeffie Pollock but did not treat per [facility] home health  Nurse.

## 2013-10-14 NOTE — Telephone Encounter (Signed)
[name], please address this question

## 2013-10-15 DIAGNOSIS — I4891 Unspecified atrial fibrillation: Secondary | ICD-10-CM | POA: Diagnosis not present

## 2013-10-15 DIAGNOSIS — F3289 Other specified depressive episodes: Secondary | ICD-10-CM | POA: Diagnosis not present

## 2013-10-15 DIAGNOSIS — I69991 Dysphagia following unspecified cerebrovascular disease: Secondary | ICD-10-CM | POA: Diagnosis not present

## 2013-10-15 DIAGNOSIS — R1310 Dysphagia, unspecified: Secondary | ICD-10-CM | POA: Diagnosis not present

## 2013-10-15 DIAGNOSIS — E119 Type 2 diabetes mellitus without complications: Secondary | ICD-10-CM | POA: Diagnosis not present

## 2013-10-15 DIAGNOSIS — F329 Major depressive disorder, single episode, unspecified: Secondary | ICD-10-CM | POA: Diagnosis not present

## 2013-10-15 DIAGNOSIS — I69959 Hemiplegia and hemiparesis following unspecified cerebrovascular disease affecting unspecified side: Secondary | ICD-10-CM | POA: Diagnosis not present

## 2013-10-17 DIAGNOSIS — I69991 Dysphagia following unspecified cerebrovascular disease: Secondary | ICD-10-CM | POA: Diagnosis not present

## 2013-10-17 DIAGNOSIS — I69959 Hemiplegia and hemiparesis following unspecified cerebrovascular disease affecting unspecified side: Secondary | ICD-10-CM | POA: Diagnosis not present

## 2013-10-17 DIAGNOSIS — R1310 Dysphagia, unspecified: Secondary | ICD-10-CM | POA: Diagnosis not present

## 2013-10-17 DIAGNOSIS — F3289 Other specified depressive episodes: Secondary | ICD-10-CM | POA: Diagnosis not present

## 2013-10-17 DIAGNOSIS — I4891 Unspecified atrial fibrillation: Secondary | ICD-10-CM | POA: Diagnosis not present

## 2013-10-17 DIAGNOSIS — F329 Major depressive disorder, single episode, unspecified: Secondary | ICD-10-CM | POA: Diagnosis not present

## 2013-10-17 DIAGNOSIS — E119 Type 2 diabetes mellitus without complications: Secondary | ICD-10-CM | POA: Diagnosis not present

## 2013-10-20 DIAGNOSIS — F3289 Other specified depressive episodes: Secondary | ICD-10-CM | POA: Diagnosis not present

## 2013-10-20 DIAGNOSIS — E119 Type 2 diabetes mellitus without complications: Secondary | ICD-10-CM | POA: Diagnosis not present

## 2013-10-20 DIAGNOSIS — I4891 Unspecified atrial fibrillation: Secondary | ICD-10-CM | POA: Diagnosis not present

## 2013-10-20 DIAGNOSIS — I69959 Hemiplegia and hemiparesis following unspecified cerebrovascular disease affecting unspecified side: Secondary | ICD-10-CM | POA: Diagnosis not present

## 2013-10-20 DIAGNOSIS — I69991 Dysphagia following unspecified cerebrovascular disease: Secondary | ICD-10-CM | POA: Diagnosis not present

## 2013-10-20 DIAGNOSIS — F329 Major depressive disorder, single episode, unspecified: Secondary | ICD-10-CM | POA: Diagnosis not present

## 2013-10-20 DIAGNOSIS — R1310 Dysphagia, unspecified: Secondary | ICD-10-CM | POA: Diagnosis not present

## 2013-10-21 DIAGNOSIS — F329 Major depressive disorder, single episode, unspecified: Secondary | ICD-10-CM | POA: Diagnosis not present

## 2013-10-21 DIAGNOSIS — F3289 Other specified depressive episodes: Secondary | ICD-10-CM | POA: Diagnosis not present

## 2013-10-21 DIAGNOSIS — R569 Unspecified convulsions: Secondary | ICD-10-CM | POA: Diagnosis not present

## 2013-10-21 DIAGNOSIS — I4891 Unspecified atrial fibrillation: Secondary | ICD-10-CM | POA: Diagnosis not present

## 2013-10-21 DIAGNOSIS — I69991 Dysphagia following unspecified cerebrovascular disease: Secondary | ICD-10-CM | POA: Diagnosis not present

## 2013-10-21 DIAGNOSIS — R1310 Dysphagia, unspecified: Secondary | ICD-10-CM | POA: Diagnosis not present

## 2013-10-21 DIAGNOSIS — I69959 Hemiplegia and hemiparesis following unspecified cerebrovascular disease affecting unspecified side: Secondary | ICD-10-CM | POA: Diagnosis not present

## 2013-10-21 DIAGNOSIS — E119 Type 2 diabetes mellitus without complications: Secondary | ICD-10-CM | POA: Diagnosis not present

## 2013-10-22 DIAGNOSIS — I69959 Hemiplegia and hemiparesis following unspecified cerebrovascular disease affecting unspecified side: Secondary | ICD-10-CM | POA: Diagnosis not present

## 2013-10-22 DIAGNOSIS — F329 Major depressive disorder, single episode, unspecified: Secondary | ICD-10-CM | POA: Diagnosis not present

## 2013-10-22 DIAGNOSIS — R1310 Dysphagia, unspecified: Secondary | ICD-10-CM | POA: Diagnosis not present

## 2013-10-22 DIAGNOSIS — F3289 Other specified depressive episodes: Secondary | ICD-10-CM | POA: Diagnosis not present

## 2013-10-22 DIAGNOSIS — I69991 Dysphagia following unspecified cerebrovascular disease: Secondary | ICD-10-CM | POA: Diagnosis not present

## 2013-10-22 DIAGNOSIS — I4891 Unspecified atrial fibrillation: Secondary | ICD-10-CM | POA: Diagnosis not present

## 2013-10-22 DIAGNOSIS — E119 Type 2 diabetes mellitus without complications: Secondary | ICD-10-CM | POA: Diagnosis not present

## 2013-10-24 DIAGNOSIS — R1310 Dysphagia, unspecified: Secondary | ICD-10-CM | POA: Diagnosis not present

## 2013-10-24 DIAGNOSIS — F329 Major depressive disorder, single episode, unspecified: Secondary | ICD-10-CM | POA: Diagnosis not present

## 2013-10-24 DIAGNOSIS — I69959 Hemiplegia and hemiparesis following unspecified cerebrovascular disease affecting unspecified side: Secondary | ICD-10-CM | POA: Diagnosis not present

## 2013-10-24 DIAGNOSIS — I69991 Dysphagia following unspecified cerebrovascular disease: Secondary | ICD-10-CM | POA: Diagnosis not present

## 2013-10-24 DIAGNOSIS — E119 Type 2 diabetes mellitus without complications: Secondary | ICD-10-CM | POA: Diagnosis not present

## 2013-10-24 DIAGNOSIS — F3289 Other specified depressive episodes: Secondary | ICD-10-CM | POA: Diagnosis not present

## 2013-10-24 DIAGNOSIS — I4891 Unspecified atrial fibrillation: Secondary | ICD-10-CM | POA: Diagnosis not present

## 2013-10-28 ENCOUNTER — Telehealth: Payer: Self-pay | Admitting: Family Medicine

## 2013-10-28 DIAGNOSIS — R1310 Dysphagia, unspecified: Secondary | ICD-10-CM | POA: Diagnosis not present

## 2013-10-28 DIAGNOSIS — F3289 Other specified depressive episodes: Secondary | ICD-10-CM | POA: Diagnosis not present

## 2013-10-28 DIAGNOSIS — I4891 Unspecified atrial fibrillation: Secondary | ICD-10-CM | POA: Diagnosis not present

## 2013-10-28 DIAGNOSIS — I69959 Hemiplegia and hemiparesis following unspecified cerebrovascular disease affecting unspecified side: Secondary | ICD-10-CM | POA: Diagnosis not present

## 2013-10-28 DIAGNOSIS — E119 Type 2 diabetes mellitus without complications: Secondary | ICD-10-CM | POA: Diagnosis not present

## 2013-10-28 DIAGNOSIS — I69991 Dysphagia following unspecified cerebrovascular disease: Secondary | ICD-10-CM | POA: Diagnosis not present

## 2013-10-28 DIAGNOSIS — F329 Major depressive disorder, single episode, unspecified: Secondary | ICD-10-CM | POA: Diagnosis not present

## 2013-10-28 NOTE — Telephone Encounter (Signed)
the surgeon that inserted the peg tube should look at  it  immediately

## 2013-10-28 NOTE — Telephone Encounter (Signed)
Called and told husband what Dr Laurance Flatten said

## 2013-10-29 ENCOUNTER — Telehealth: Payer: Self-pay | Admitting: Internal Medicine

## 2013-10-29 DIAGNOSIS — E119 Type 2 diabetes mellitus without complications: Secondary | ICD-10-CM | POA: Diagnosis not present

## 2013-10-29 DIAGNOSIS — I69959 Hemiplegia and hemiparesis following unspecified cerebrovascular disease affecting unspecified side: Secondary | ICD-10-CM | POA: Diagnosis not present

## 2013-10-29 DIAGNOSIS — I4891 Unspecified atrial fibrillation: Secondary | ICD-10-CM | POA: Diagnosis not present

## 2013-10-29 DIAGNOSIS — F329 Major depressive disorder, single episode, unspecified: Secondary | ICD-10-CM | POA: Diagnosis not present

## 2013-10-29 DIAGNOSIS — I69991 Dysphagia following unspecified cerebrovascular disease: Secondary | ICD-10-CM | POA: Diagnosis not present

## 2013-10-29 DIAGNOSIS — R1310 Dysphagia, unspecified: Secondary | ICD-10-CM | POA: Diagnosis not present

## 2013-10-29 DIAGNOSIS — F3289 Other specified depressive episodes: Secondary | ICD-10-CM | POA: Diagnosis not present

## 2013-10-29 NOTE — Telephone Encounter (Signed)
She is eating and drinking and taking all her medications by mouth.  The PEG tube is hurting her.  Can we refer her to someone to take out? Dr Mervyn Skeeters placed

## 2013-10-29 NOTE — Telephone Encounter (Signed)
New message     Pt had a stroke and cannot swollow.  Pt says her "peg" tube is hurting.  Who can remove the peg tube? Dr Mervyn Skeeters in [place] put the peg tube in.  Pt is scheduled to see Dr Rayann Heman 11-19-13.

## 2013-10-30 DIAGNOSIS — F329 Major depressive disorder, single episode, unspecified: Secondary | ICD-10-CM | POA: Diagnosis not present

## 2013-10-30 DIAGNOSIS — R1310 Dysphagia, unspecified: Secondary | ICD-10-CM | POA: Diagnosis not present

## 2013-10-30 DIAGNOSIS — F3289 Other specified depressive episodes: Secondary | ICD-10-CM | POA: Diagnosis not present

## 2013-10-30 DIAGNOSIS — I69959 Hemiplegia and hemiparesis following unspecified cerebrovascular disease affecting unspecified side: Secondary | ICD-10-CM | POA: Diagnosis not present

## 2013-10-30 DIAGNOSIS — E119 Type 2 diabetes mellitus without complications: Secondary | ICD-10-CM | POA: Diagnosis not present

## 2013-10-30 DIAGNOSIS — I69991 Dysphagia following unspecified cerebrovascular disease: Secondary | ICD-10-CM | POA: Diagnosis not present

## 2013-10-30 DIAGNOSIS — I4891 Unspecified atrial fibrillation: Secondary | ICD-10-CM | POA: Diagnosis not present

## 2013-10-30 NOTE — Telephone Encounter (Signed)
I have advised patient's husband to contact [facility] Dr. Mervyn Skeeters who place the PEG tube for advise.  I gave him the number to call.  They will keep the follow up with Dr Rayann Heman on 11/19/13

## 2013-10-31 DIAGNOSIS — Z950 Presence of cardiac pacemaker: Secondary | ICD-10-CM | POA: Diagnosis not present

## 2013-10-31 DIAGNOSIS — I10 Essential (primary) hypertension: Secondary | ICD-10-CM | POA: Diagnosis not present

## 2013-10-31 DIAGNOSIS — G629 Polyneuropathy, unspecified: Secondary | ICD-10-CM | POA: Diagnosis not present

## 2013-10-31 DIAGNOSIS — I69391 Dysphagia following cerebral infarction: Secondary | ICD-10-CM | POA: Diagnosis not present

## 2013-10-31 DIAGNOSIS — R1310 Dysphagia, unspecified: Secondary | ICD-10-CM | POA: Diagnosis not present

## 2013-10-31 DIAGNOSIS — Z7901 Long term (current) use of anticoagulants: Secondary | ICD-10-CM | POA: Diagnosis not present

## 2013-10-31 DIAGNOSIS — E119 Type 2 diabetes mellitus without complications: Secondary | ICD-10-CM | POA: Diagnosis not present

## 2013-10-31 DIAGNOSIS — I69353 Hemiplegia and hemiparesis following cerebral infarction affecting right non-dominant side: Secondary | ICD-10-CM | POA: Diagnosis not present

## 2013-10-31 DIAGNOSIS — I4891 Unspecified atrial fibrillation: Secondary | ICD-10-CM | POA: Diagnosis not present

## 2013-10-31 DIAGNOSIS — Z794 Long term (current) use of insulin: Secondary | ICD-10-CM | POA: Diagnosis not present

## 2013-10-31 DIAGNOSIS — F329 Major depressive disorder, single episode, unspecified: Secondary | ICD-10-CM | POA: Diagnosis not present

## 2013-11-03 DIAGNOSIS — R1310 Dysphagia, unspecified: Secondary | ICD-10-CM | POA: Diagnosis not present

## 2013-11-03 DIAGNOSIS — I4891 Unspecified atrial fibrillation: Secondary | ICD-10-CM | POA: Diagnosis not present

## 2013-11-03 DIAGNOSIS — E119 Type 2 diabetes mellitus without complications: Secondary | ICD-10-CM | POA: Diagnosis not present

## 2013-11-03 DIAGNOSIS — F329 Major depressive disorder, single episode, unspecified: Secondary | ICD-10-CM | POA: Diagnosis not present

## 2013-11-03 DIAGNOSIS — L408 Other psoriasis: Secondary | ICD-10-CM | POA: Diagnosis not present

## 2013-11-03 DIAGNOSIS — I69391 Dysphagia following cerebral infarction: Secondary | ICD-10-CM | POA: Diagnosis not present

## 2013-11-03 DIAGNOSIS — I69353 Hemiplegia and hemiparesis following cerebral infarction affecting right non-dominant side: Secondary | ICD-10-CM | POA: Diagnosis not present

## 2013-11-04 ENCOUNTER — Telehealth: Payer: Self-pay | Admitting: Family Medicine

## 2013-11-04 DIAGNOSIS — E119 Type 2 diabetes mellitus without complications: Secondary | ICD-10-CM | POA: Diagnosis not present

## 2013-11-04 DIAGNOSIS — F329 Major depressive disorder, single episode, unspecified: Secondary | ICD-10-CM | POA: Diagnosis not present

## 2013-11-04 DIAGNOSIS — I69391 Dysphagia following cerebral infarction: Secondary | ICD-10-CM | POA: Diagnosis not present

## 2013-11-04 DIAGNOSIS — R1310 Dysphagia, unspecified: Secondary | ICD-10-CM | POA: Diagnosis not present

## 2013-11-04 DIAGNOSIS — I69353 Hemiplegia and hemiparesis following cerebral infarction affecting right non-dominant side: Secondary | ICD-10-CM | POA: Diagnosis not present

## 2013-11-04 DIAGNOSIS — I4891 Unspecified atrial fibrillation: Secondary | ICD-10-CM | POA: Diagnosis not present

## 2013-11-04 NOTE — Telephone Encounter (Signed)
Please do referral to the nearest surgeon that would be willing to take this out

## 2013-11-05 ENCOUNTER — Other Ambulatory Visit: Payer: Self-pay

## 2013-11-05 DIAGNOSIS — I69353 Hemiplegia and hemiparesis following cerebral infarction affecting right non-dominant side: Secondary | ICD-10-CM | POA: Diagnosis not present

## 2013-11-05 DIAGNOSIS — E119 Type 2 diabetes mellitus without complications: Secondary | ICD-10-CM | POA: Diagnosis not present

## 2013-11-05 DIAGNOSIS — F329 Major depressive disorder, single episode, unspecified: Secondary | ICD-10-CM | POA: Diagnosis not present

## 2013-11-05 DIAGNOSIS — I69391 Dysphagia following cerebral infarction: Secondary | ICD-10-CM | POA: Diagnosis not present

## 2013-11-05 DIAGNOSIS — R1310 Dysphagia, unspecified: Secondary | ICD-10-CM | POA: Diagnosis not present

## 2013-11-05 DIAGNOSIS — I4891 Unspecified atrial fibrillation: Secondary | ICD-10-CM | POA: Diagnosis not present

## 2013-11-06 DIAGNOSIS — I69391 Dysphagia following cerebral infarction: Secondary | ICD-10-CM | POA: Diagnosis not present

## 2013-11-06 DIAGNOSIS — I4891 Unspecified atrial fibrillation: Secondary | ICD-10-CM | POA: Diagnosis not present

## 2013-11-06 DIAGNOSIS — I69353 Hemiplegia and hemiparesis following cerebral infarction affecting right non-dominant side: Secondary | ICD-10-CM | POA: Diagnosis not present

## 2013-11-06 DIAGNOSIS — F329 Major depressive disorder, single episode, unspecified: Secondary | ICD-10-CM | POA: Diagnosis not present

## 2013-11-06 DIAGNOSIS — R1310 Dysphagia, unspecified: Secondary | ICD-10-CM | POA: Diagnosis not present

## 2013-11-06 DIAGNOSIS — E119 Type 2 diabetes mellitus without complications: Secondary | ICD-10-CM | POA: Diagnosis not present

## 2013-11-07 DIAGNOSIS — F329 Major depressive disorder, single episode, unspecified: Secondary | ICD-10-CM | POA: Diagnosis not present

## 2013-11-07 DIAGNOSIS — I69391 Dysphagia following cerebral infarction: Secondary | ICD-10-CM | POA: Diagnosis not present

## 2013-11-07 DIAGNOSIS — I4891 Unspecified atrial fibrillation: Secondary | ICD-10-CM | POA: Diagnosis not present

## 2013-11-07 DIAGNOSIS — R1310 Dysphagia, unspecified: Secondary | ICD-10-CM | POA: Diagnosis not present

## 2013-11-07 DIAGNOSIS — E119 Type 2 diabetes mellitus without complications: Secondary | ICD-10-CM | POA: Diagnosis not present

## 2013-11-07 DIAGNOSIS — I69353 Hemiplegia and hemiparesis following cerebral infarction affecting right non-dominant side: Secondary | ICD-10-CM | POA: Diagnosis not present

## 2013-11-10 DIAGNOSIS — I69391 Dysphagia following cerebral infarction: Secondary | ICD-10-CM | POA: Diagnosis not present

## 2013-11-10 DIAGNOSIS — E119 Type 2 diabetes mellitus without complications: Secondary | ICD-10-CM | POA: Diagnosis not present

## 2013-11-10 DIAGNOSIS — I4891 Unspecified atrial fibrillation: Secondary | ICD-10-CM | POA: Diagnosis not present

## 2013-11-10 DIAGNOSIS — I69353 Hemiplegia and hemiparesis following cerebral infarction affecting right non-dominant side: Secondary | ICD-10-CM | POA: Diagnosis not present

## 2013-11-10 DIAGNOSIS — F329 Major depressive disorder, single episode, unspecified: Secondary | ICD-10-CM | POA: Diagnosis not present

## 2013-11-10 DIAGNOSIS — R1310 Dysphagia, unspecified: Secondary | ICD-10-CM | POA: Diagnosis not present

## 2013-11-12 ENCOUNTER — Encounter: Payer: Medicare Other | Admitting: Internal Medicine

## 2013-11-13 DIAGNOSIS — R1310 Dysphagia, unspecified: Secondary | ICD-10-CM | POA: Diagnosis not present

## 2013-11-13 DIAGNOSIS — I69353 Hemiplegia and hemiparesis following cerebral infarction affecting right non-dominant side: Secondary | ICD-10-CM | POA: Diagnosis not present

## 2013-11-13 DIAGNOSIS — F329 Major depressive disorder, single episode, unspecified: Secondary | ICD-10-CM | POA: Diagnosis not present

## 2013-11-13 DIAGNOSIS — I4891 Unspecified atrial fibrillation: Secondary | ICD-10-CM | POA: Diagnosis not present

## 2013-11-13 DIAGNOSIS — E119 Type 2 diabetes mellitus without complications: Secondary | ICD-10-CM | POA: Diagnosis not present

## 2013-11-13 DIAGNOSIS — I69391 Dysphagia following cerebral infarction: Secondary | ICD-10-CM | POA: Diagnosis not present

## 2013-11-14 DIAGNOSIS — F329 Major depressive disorder, single episode, unspecified: Secondary | ICD-10-CM | POA: Diagnosis not present

## 2013-11-14 DIAGNOSIS — R1310 Dysphagia, unspecified: Secondary | ICD-10-CM | POA: Diagnosis not present

## 2013-11-14 DIAGNOSIS — I69391 Dysphagia following cerebral infarction: Secondary | ICD-10-CM | POA: Diagnosis not present

## 2013-11-14 DIAGNOSIS — E119 Type 2 diabetes mellitus without complications: Secondary | ICD-10-CM | POA: Diagnosis not present

## 2013-11-14 DIAGNOSIS — I4891 Unspecified atrial fibrillation: Secondary | ICD-10-CM | POA: Diagnosis not present

## 2013-11-14 DIAGNOSIS — I69353 Hemiplegia and hemiparesis following cerebral infarction affecting right non-dominant side: Secondary | ICD-10-CM | POA: Diagnosis not present

## 2013-11-17 DIAGNOSIS — E119 Type 2 diabetes mellitus without complications: Secondary | ICD-10-CM | POA: Diagnosis not present

## 2013-11-17 DIAGNOSIS — F329 Major depressive disorder, single episode, unspecified: Secondary | ICD-10-CM | POA: Diagnosis not present

## 2013-11-17 DIAGNOSIS — R1310 Dysphagia, unspecified: Secondary | ICD-10-CM | POA: Diagnosis not present

## 2013-11-17 DIAGNOSIS — I69391 Dysphagia following cerebral infarction: Secondary | ICD-10-CM | POA: Diagnosis not present

## 2013-11-17 DIAGNOSIS — I4891 Unspecified atrial fibrillation: Secondary | ICD-10-CM | POA: Diagnosis not present

## 2013-11-17 DIAGNOSIS — I69353 Hemiplegia and hemiparesis following cerebral infarction affecting right non-dominant side: Secondary | ICD-10-CM | POA: Diagnosis not present

## 2013-11-18 DIAGNOSIS — R1310 Dysphagia, unspecified: Secondary | ICD-10-CM | POA: Diagnosis not present

## 2013-11-18 DIAGNOSIS — I69391 Dysphagia following cerebral infarction: Secondary | ICD-10-CM | POA: Diagnosis not present

## 2013-11-18 DIAGNOSIS — I69353 Hemiplegia and hemiparesis following cerebral infarction affecting right non-dominant side: Secondary | ICD-10-CM | POA: Diagnosis not present

## 2013-11-18 DIAGNOSIS — I4891 Unspecified atrial fibrillation: Secondary | ICD-10-CM | POA: Diagnosis not present

## 2013-11-18 DIAGNOSIS — E119 Type 2 diabetes mellitus without complications: Secondary | ICD-10-CM | POA: Diagnosis not present

## 2013-11-18 DIAGNOSIS — F329 Major depressive disorder, single episode, unspecified: Secondary | ICD-10-CM | POA: Diagnosis not present

## 2013-11-19 ENCOUNTER — Encounter: Payer: Medicare Other | Admitting: Internal Medicine

## 2013-11-19 DIAGNOSIS — R1310 Dysphagia, unspecified: Secondary | ICD-10-CM

## 2013-11-19 DIAGNOSIS — F329 Major depressive disorder, single episode, unspecified: Secondary | ICD-10-CM | POA: Diagnosis not present

## 2013-11-19 DIAGNOSIS — I4891 Unspecified atrial fibrillation: Secondary | ICD-10-CM | POA: Diagnosis not present

## 2013-11-19 DIAGNOSIS — F3289 Other specified depressive episodes: Secondary | ICD-10-CM | POA: Diagnosis not present

## 2013-11-19 DIAGNOSIS — I69391 Dysphagia following cerebral infarction: Secondary | ICD-10-CM | POA: Diagnosis not present

## 2013-11-19 DIAGNOSIS — I69991 Dysphagia following unspecified cerebrovascular disease: Secondary | ICD-10-CM | POA: Diagnosis not present

## 2013-11-19 DIAGNOSIS — I69959 Hemiplegia and hemiparesis following unspecified cerebrovascular disease affecting unspecified side: Secondary | ICD-10-CM | POA: Diagnosis not present

## 2013-11-19 DIAGNOSIS — I69353 Hemiplegia and hemiparesis following cerebral infarction affecting right non-dominant side: Secondary | ICD-10-CM | POA: Diagnosis not present

## 2013-11-19 DIAGNOSIS — E119 Type 2 diabetes mellitus without complications: Secondary | ICD-10-CM | POA: Diagnosis not present

## 2013-11-20 DIAGNOSIS — E119 Type 2 diabetes mellitus without complications: Secondary | ICD-10-CM | POA: Diagnosis not present

## 2013-11-20 DIAGNOSIS — R1310 Dysphagia, unspecified: Secondary | ICD-10-CM | POA: Diagnosis not present

## 2013-11-20 DIAGNOSIS — F329 Major depressive disorder, single episode, unspecified: Secondary | ICD-10-CM | POA: Diagnosis not present

## 2013-11-20 DIAGNOSIS — I69353 Hemiplegia and hemiparesis following cerebral infarction affecting right non-dominant side: Secondary | ICD-10-CM | POA: Diagnosis not present

## 2013-11-20 DIAGNOSIS — I69391 Dysphagia following cerebral infarction: Secondary | ICD-10-CM | POA: Diagnosis not present

## 2013-11-20 DIAGNOSIS — I4891 Unspecified atrial fibrillation: Secondary | ICD-10-CM | POA: Diagnosis not present

## 2013-11-24 DIAGNOSIS — R1310 Dysphagia, unspecified: Secondary | ICD-10-CM | POA: Diagnosis not present

## 2013-11-24 DIAGNOSIS — I4891 Unspecified atrial fibrillation: Secondary | ICD-10-CM | POA: Diagnosis not present

## 2013-11-24 DIAGNOSIS — I69353 Hemiplegia and hemiparesis following cerebral infarction affecting right non-dominant side: Secondary | ICD-10-CM | POA: Diagnosis not present

## 2013-11-24 DIAGNOSIS — F329 Major depressive disorder, single episode, unspecified: Secondary | ICD-10-CM | POA: Diagnosis not present

## 2013-11-24 DIAGNOSIS — E119 Type 2 diabetes mellitus without complications: Secondary | ICD-10-CM | POA: Diagnosis not present

## 2013-11-24 DIAGNOSIS — I69391 Dysphagia following cerebral infarction: Secondary | ICD-10-CM | POA: Diagnosis not present

## 2013-11-25 DIAGNOSIS — I4891 Unspecified atrial fibrillation: Secondary | ICD-10-CM | POA: Diagnosis not present

## 2013-11-25 DIAGNOSIS — R1310 Dysphagia, unspecified: Secondary | ICD-10-CM | POA: Diagnosis not present

## 2013-11-25 DIAGNOSIS — E119 Type 2 diabetes mellitus without complications: Secondary | ICD-10-CM | POA: Diagnosis not present

## 2013-11-25 DIAGNOSIS — F329 Major depressive disorder, single episode, unspecified: Secondary | ICD-10-CM | POA: Diagnosis not present

## 2013-11-25 DIAGNOSIS — I69391 Dysphagia following cerebral infarction: Secondary | ICD-10-CM | POA: Diagnosis not present

## 2013-11-25 DIAGNOSIS — I69353 Hemiplegia and hemiparesis following cerebral infarction affecting right non-dominant side: Secondary | ICD-10-CM | POA: Diagnosis not present

## 2013-11-26 ENCOUNTER — Encounter: Payer: Medicare Other | Admitting: Internal Medicine

## 2013-11-26 DIAGNOSIS — B3749 Other urogenital candidiasis: Secondary | ICD-10-CM | POA: Diagnosis not present

## 2013-11-26 DIAGNOSIS — N318 Other neuromuscular dysfunction of bladder: Secondary | ICD-10-CM | POA: Diagnosis not present

## 2013-11-26 DIAGNOSIS — N319 Neuromuscular dysfunction of bladder, unspecified: Secondary | ICD-10-CM | POA: Diagnosis not present

## 2013-11-26 DIAGNOSIS — R339 Retention of urine, unspecified: Secondary | ICD-10-CM | POA: Diagnosis not present

## 2013-11-26 DIAGNOSIS — R32 Unspecified urinary incontinence: Secondary | ICD-10-CM | POA: Diagnosis not present

## 2013-11-26 DIAGNOSIS — R3915 Urgency of urination: Secondary | ICD-10-CM | POA: Diagnosis not present

## 2013-11-28 DIAGNOSIS — I4891 Unspecified atrial fibrillation: Secondary | ICD-10-CM | POA: Diagnosis not present

## 2013-11-28 DIAGNOSIS — F329 Major depressive disorder, single episode, unspecified: Secondary | ICD-10-CM | POA: Diagnosis not present

## 2013-11-28 DIAGNOSIS — Z431 Encounter for attention to gastrostomy: Secondary | ICD-10-CM | POA: Diagnosis not present

## 2013-11-28 DIAGNOSIS — I69353 Hemiplegia and hemiparesis following cerebral infarction affecting right non-dominant side: Secondary | ICD-10-CM | POA: Diagnosis not present

## 2013-11-28 DIAGNOSIS — R1310 Dysphagia, unspecified: Secondary | ICD-10-CM | POA: Diagnosis not present

## 2013-11-28 DIAGNOSIS — I69391 Dysphagia following cerebral infarction: Secondary | ICD-10-CM | POA: Diagnosis not present

## 2013-11-28 DIAGNOSIS — E119 Type 2 diabetes mellitus without complications: Secondary | ICD-10-CM | POA: Diagnosis not present

## 2013-12-01 ENCOUNTER — Encounter: Payer: Self-pay | Admitting: Family Medicine

## 2013-12-01 ENCOUNTER — Ambulatory Visit (INDEPENDENT_AMBULATORY_CARE_PROVIDER_SITE_OTHER): Payer: Medicare Other | Admitting: Family Medicine

## 2013-12-01 VITALS — BP 137/82 | HR 72 | Temp 96.6°F | Ht 64.0 in | Wt 121.0 lb

## 2013-12-01 DIAGNOSIS — L209 Atopic dermatitis, unspecified: Secondary | ICD-10-CM

## 2013-12-01 DIAGNOSIS — R630 Anorexia: Secondary | ICD-10-CM | POA: Diagnosis not present

## 2013-12-01 DIAGNOSIS — E8881 Metabolic syndrome: Secondary | ICD-10-CM

## 2013-12-01 DIAGNOSIS — N390 Urinary tract infection, site not specified: Secondary | ICD-10-CM

## 2013-12-01 DIAGNOSIS — I6349 Cerebral infarction due to embolism of other cerebral artery: Secondary | ICD-10-CM

## 2013-12-01 DIAGNOSIS — Z23 Encounter for immunization: Secondary | ICD-10-CM

## 2013-12-01 DIAGNOSIS — I10 Essential (primary) hypertension: Secondary | ICD-10-CM

## 2013-12-01 DIAGNOSIS — E039 Hypothyroidism, unspecified: Secondary | ICD-10-CM | POA: Diagnosis not present

## 2013-12-01 DIAGNOSIS — R5383 Other fatigue: Secondary | ICD-10-CM | POA: Diagnosis not present

## 2013-12-01 DIAGNOSIS — R231 Pallor: Secondary | ICD-10-CM

## 2013-12-01 DIAGNOSIS — B372 Candidiasis of skin and nail: Secondary | ICD-10-CM

## 2013-12-01 DIAGNOSIS — E559 Vitamin D deficiency, unspecified: Secondary | ICD-10-CM | POA: Diagnosis not present

## 2013-12-01 LAB — POCT CBC
Granulocyte percent: 56.2 %G (ref 37–80)
HCT, POC: 41.7 % (ref 37.7–47.9)
Hemoglobin: 13.9 g/dL (ref 12.2–16.2)
Lymph, poc: 3.1 (ref 0.6–3.4)
MCH, POC: 30 pg (ref 27–31.2)
MCHC: 33.3 g/dL (ref 31.8–35.4)
MCV: 90.2 fL (ref 80–97)
MPV: 7.3 fL (ref 0–99.8)
POC Granulocyte: 4.8 (ref 2–6.9)
POC LYMPH PERCENT: 36.5 %L (ref 10–50)
Platelet Count, POC: 302 10*3/uL (ref 142–424)
RBC: 4.6 M/uL (ref 4.04–5.48)
RDW, POC: 13.4 %
WBC: 8.5 10*3/uL (ref 4.6–10.2)

## 2013-12-01 NOTE — Progress Notes (Signed)
Subjective:    Patient ID: [name], female    DOB: Jul 09, 1945, 68 y.o.   MRN: [id number]  HPI Pt here for follow up and management of chronic medical problems. The patient has had some loose stools recently. She also complains with a rash beneath her left breast. She comes to the visit today with her husband. She appears to be more verbal. All of her medicines are now by mouth. Her gastrostomy or feeding tube has been removed. The patient is much more positive in her demeanor today.        Patient Active Problem List   Diagnosis Date Noted  . Cerebral infarction due to embolism of other cerebral artery 09/03/2013  . Metabolic syndrome 57/84/6962  . High risk medication use 03/13/2013  . Fever, unspecified 06/05/2012  . Generalized abdominal pain 06/05/2012  . PAF (paroxysmal atrial fibrillation) 02/07/2012  . Tachy-brady syndrome   . Impaired glucose tolerance   . FATIGUE 12/22/2009  . SNORING 12/22/2009  . OTHER CHRONIC NONALCOHOLIC LIVER DISEASE 95/28/4132  . NONSPEC ELEVATION OF LEVELS OF TRANSAMINASE/LDH 03/09/2009  . PPM-St.Jude 09/01/2008  . BRADYCARDIA 08/13/2008  . HYPOTHYROIDISM 06/09/2008  . HYPERTENSION 06/09/2008  . Atrial fibrillation 06/09/2008  . PREMATURE VENTRICULAR CONTRACTIONS 06/09/2008  . IRRITABLE BOWEL SYNDROME 06/09/2008  . PSORIASIS 06/09/2008  . EDEMA 06/09/2008  . DIVERTICULOSIS, COLON, HX OF 06/09/2008   Outpatient Encounter Prescriptions as of 12/01/2013  Medication Sig  . acetaminophen (TYLENOL) 160 MG/5ML solution 650 mg as needed.  Marland Kitchen aspirin 81 MG tablet Take 1 tablet (81 mg total) by mouth daily.  Marland Kitchen bismuth subsalicylate (PEPTO BISMOL) 262 MG/15ML suspension Take 30 mLs by mouth 4 (four) times daily -  before meals and at bedtime.  . Calcium Citrate-Vitamin D (CITRACAL PETITES/VITAMIN D) 200-250 MG-UNIT TABS Take 2 tablets by mouth every morning.  . citalopram (CELEXA) 20 MG tablet Take 1 tablet (20 mg total) by mouth daily.  Marland Kitchen  dicyclomine (BENTYL) 10 MG capsule Take 1 capsule (10 mg total) by mouth 4 (four) times daily -  before meals and at bedtime.  . flecainide (TAMBOCOR) 50 MG tablet Take 1 tablet (50 mg total) by mouth 2 (two) times daily.  . fluticasone (FLONASE) 50 MCG/ACT nasal spray Place 2 sprays into both nostrils daily.  . lansoprazole (PREVACID) 30 MG capsule Take 1 capsule (30 mg total) by mouth daily at 12 noon.  . levETIRAcetam (KEPPRA) 500 MG tablet Take 1 tablet (500 mg total) by mouth 2 (two) times daily.  Marland Kitchen levothyroxine (SYNTHROID, LEVOTHROID) 88 MCG tablet TAKE 1 TABLET BY MOUTH DAILY BEFORE BREAKFAST  . metFORMIN (GLUCOPHAGE-XR) 500 MG 24 hr tablet TAKE 1 TABLET BY MOUTH EVERY DAY WITH BREAKFAST  . methotrexate (RHEUMATREX) 2.5 MG tablet Take 4 tablets (10 mg total) by mouth once a week. Caution:Chemotherapy. Protect from light.  . metoprolol (LOPRESSOR) 50 MG tablet Take 1.5 tablets (75 mg total) by mouth 2 (two) times daily. 1 1/2 tab bid  . mirtazapine (REMERON) 15 MG tablet 1 tab po qd  . nitrofurantoin, macrocrystal-monohydrate, (MACROBID) 100 MG capsule Take 1 capsule by mouth 2 (two) times daily.  Marland Kitchen nystatin-triamcinolone (MYCOLOG II) cream Apply 1 application topically 2 (two) times daily.  . potassium chloride SA (K-DUR,KLOR-CON) 20 MEQ tablet Take 2 tablets (40 mEq total) by mouth 2 (two) times daily.  . rivaroxaban (XARELTO) 20 MG TABS tablet TAKE ONE TABLET BY MOUTH EVERY DAY  . simvastatin (ZOCOR) 40 MG tablet Take 1 tablet (40 mg  total) by mouth daily.  . traMADol (ULTRAM) 50 MG tablet Take 1 tablet (50 mg total) by mouth every 8 (eight) hours as needed for pain.  . [DISCONTINUED] nystatin (MYCOSTATIN) 100000 UNIT/ML suspension Take 5 mLs (500,000 Units total) by mouth 4 (four) times daily. Swish and swallow  . [DISCONTINUED] potassium chloride 20 MEQ/15ML (10%) solution Place 15 mLs (20 mEq total) into feeding tube daily.  Marland Kitchen loperamide (IMODIUM) 2 MG capsule Take by mouth as needed  for diarrhea or loose stools.  . predniSONE (DELTASONE) 20 MG tablet Take 20 mg by mouth as needed. gout  . [DISCONTINUED] fexofenadine (ALLEGRA) 180 MG tablet Take 1 tablet (180 mg total) by mouth daily.    Review of Systems  Constitutional: Negative.   HENT: Negative.   Eyes: Negative.   Respiratory: Negative.   Cardiovascular: Negative.   Gastrointestinal: Positive for diarrhea (recently).  Endocrine: Negative.   Genitourinary: Negative.   Musculoskeletal: Negative.   Skin: Positive for rash (under left breast).       Dry skin   Allergic/Immunologic: Negative.   Neurological: Negative.   Hematological: Negative.   Psychiatric/Behavioral: Negative.        Objective:   Physical Exam  Nursing note and vitals reviewed. Constitutional: She is oriented to person, place, and time. No distress.  The patient remains in a somewhat pale. She has a much more positive demeanor than on the previous visit.  HENT:  Head: Normocephalic and atraumatic.  Right Ear: External ear normal.  Left Ear: External ear normal.  Nose: Nose normal.  Mouth/Throat: Oropharynx is clear and moist.  Eyes: Conjunctivae and EOM are normal. Pupils are equal, round, and reactive to light. Right eye exhibits no discharge. Left eye exhibits no discharge. No scleral icterus.  Neck: Normal range of motion. Neck supple. No thyromegaly present.  Cardiovascular: Normal rate, regular rhythm and normal heart sounds.  Exam reveals no gallop and no friction rub.   No murmur heard. The heart has a regular rate and rhythm at 72 per minute  Pulmonary/Chest: Effort normal and breath sounds normal. No respiratory distress. She has no wheezes. She has no rales. She exhibits no tenderness.  Abdominal: Soft. Bowel sounds are normal. She exhibits no mass. There is no tenderness. There is no rebound and no guarding.  Musculoskeletal: She exhibits no edema and no tenderness.  She is in a wheelchair today.  Lymphadenopathy:    She  has no cervical adenopathy.  Neurological: She is alert and oriented to person, place, and time.  She has good movement of her left upper extremity and both left and right lower extremities. She has grip with her right hand but is unable to abduct and adduct the upper arm well  Skin: Skin is warm and dry. No rash noted.  Psychiatric: She has a normal mood and affect. Her behavior is normal. Judgment and thought content normal.   BP 137/82  Pulse 72  Temp(Src) 96.6 F (35.9 C) (Oral)  Ht 5\' 4"  (1.626 m)  Wt 121 lb (54.885 kg)  BMI 20.76 kg/m2        Assessment & Plan:  1. Decrease in appetite - POCT CBC - BMP8+EGFR - Hepatic function panel - Vit D  25 hydroxy (rtn osteoporosis monitoring)  2. Pale skin - POCT CBC - BMP8+EGFR - Hepatic function panel - Vit D  25 hydroxy (rtn osteoporosis monitoring)  3. Other fatigue - Thyroid Panel With TSH  4. Essential hypertension  5. Metabolic syndrome  6.  Hypothyroidism, unspecified hypothyroidism type - Thyroid Panel With TSH  7. UTI (lower urinary tract infection)  8. Yeast dermatitis -Continue cream as directed  9. Atopic dermatitis -To use the dressings that are hypoallergenic  10. Cerebral infarction due to embolism of other cerebral artery -Continue physical therapy and followup with neurology  Meds ordered this encounter  Medications  . nitrofurantoin, macrocrystal-monohydrate, (MACROBID) 100 MG capsule    Sig: Take 1 capsule by mouth 2 (two) times daily.   Patient Instructions                       Medicare Annual Wellness Visit  [facility] and the medical providers at [place] strive to bring you the best medical care.  In doing so we not only want to address your current medical conditions and concerns but also to detect new conditions early and prevent illness, disease and health-related problems.    Medicare offers a yearly Wellness Visit which allows our clinical staff to assess  your need for preventative services including immunizations, lifestyle education, counseling to decrease risk of preventable diseases and screening for fall risk and other medical concerns.    This visit is provided free of charge (no copay) for all Medicare recipients. The clinical pharmacists at [facility] have begun to conduct these Wellness Visits which will also include a thorough review of all your medications.    As you primary medical provider recommend that you make an appointment for your Annual Wellness Visit if you have not done so already this year.  You may set up this appointment before you leave today or you may call back ([phone]) and schedule an appointment.  Please make sure when you call that you mention that you are scheduling your Annual Wellness Visit with the clinical pharmacist so that the appointment may be made for the proper length of time.      Continue current medications. Continue good therapeutic lifestyle changes which include good diet and exercise. Fall precautions discussed with patient. If an FOBT was given today- please return it to our front desk. If you are over 53 years old - you may need Prevnar 79 or the adult Pneumonia vaccine.  Flu Shots will be available at our office starting mid- September. Please call and schedule a FLU CLINIC APPOINTMENT.   Continue to try to eat healthy and eat more Continued to drink plenty of fluids Continue followup with cardiology and urology and neurology Continue to take medication for loose bowel movements Try the nystatin cream twice daily for 7-10 days and if no improvement call us back, and we will try the nystatin Powder We will confirm with her urologist about taking the Macrobid twice daily with food   Arrie Senate MD

## 2013-12-01 NOTE — Patient Instructions (Addendum)
Medicare Annual Wellness Visit  [facility] and the medical providers at [place] strive to bring you the best medical care.  In doing so we not only want to address your current medical conditions and concerns but also to detect new conditions early and prevent illness, disease and health-related problems.    Medicare offers a yearly Wellness Visit which allows our clinical staff to assess your need for preventative services including immunizations, lifestyle education, counseling to decrease risk of preventable diseases and screening for fall risk and other medical concerns.    This visit is provided free of charge (no copay) for all Medicare recipients. The clinical pharmacists at [facility] have begun to conduct these Wellness Visits which will also include a thorough review of all your medications.    As you primary medical provider recommend that you make an appointment for your Annual Wellness Visit if you have not done so already this year.  You may set up this appointment before you leave today or you may call back ([phone]) and schedule an appointment.  Please make sure when you call that you mention that you are scheduling your Annual Wellness Visit with the clinical pharmacist so that the appointment may be made for the proper length of time.      Continue current medications. Continue good therapeutic lifestyle changes which include good diet and exercise. Fall precautions discussed with patient. If an FOBT was given today- please return it to our front desk. If you are over 16 years old - you may need Prevnar 62 or the adult Pneumonia vaccine.  Flu Shots will be available at our office starting mid- September. Please call and schedule a FLU CLINIC APPOINTMENT.   Continue to try to eat healthy and eat more Continued to drink plenty of fluids Continue followup with cardiology and urology and neurology Continue  to take medication for loose bowel movements Try the nystatin cream twice daily for 7-10 days and if no improvement call us back, and we will try the nystatin Powder We will confirm with her urologist about taking the Macrobid twice daily with food

## 2013-12-02 DIAGNOSIS — F329 Major depressive disorder, single episode, unspecified: Secondary | ICD-10-CM | POA: Diagnosis not present

## 2013-12-02 DIAGNOSIS — I69353 Hemiplegia and hemiparesis following cerebral infarction affecting right non-dominant side: Secondary | ICD-10-CM | POA: Diagnosis not present

## 2013-12-02 DIAGNOSIS — E119 Type 2 diabetes mellitus without complications: Secondary | ICD-10-CM | POA: Diagnosis not present

## 2013-12-02 DIAGNOSIS — I69391 Dysphagia following cerebral infarction: Secondary | ICD-10-CM | POA: Diagnosis not present

## 2013-12-02 DIAGNOSIS — R1310 Dysphagia, unspecified: Secondary | ICD-10-CM | POA: Diagnosis not present

## 2013-12-02 DIAGNOSIS — I4891 Unspecified atrial fibrillation: Secondary | ICD-10-CM | POA: Diagnosis not present

## 2013-12-02 LAB — HEPATIC FUNCTION PANEL
ALT: 15 IU/L (ref 0–32)
AST: 23 IU/L (ref 0–40)
Albumin: 4.1 g/dL (ref 3.6–4.8)
Alkaline Phosphatase: 55 IU/L (ref 39–117)
Bilirubin, Direct: 0.1 mg/dL (ref 0.00–0.40)
Total Bilirubin: <0.2 mg/dL (ref 0.0–1.2)
Total Protein: 6.4 g/dL (ref 6.0–8.5)

## 2013-12-02 LAB — THYROID PANEL WITH TSH
Free Thyroxine Index: 3.1 (ref 1.2–4.9)
T3 Uptake Ratio: 27 % (ref 24–39)
T4, Total: 11.3 ug/dL (ref 4.5–12.0)
TSH: 1.04 u[IU]/mL (ref 0.450–4.500)

## 2013-12-02 LAB — BMP8+EGFR
BUN/Creatinine Ratio: 16 (ref 11–26)
BUN: 10 mg/dL (ref 8–27)
CO2: 23 mmol/L (ref 18–29)
Calcium: 9.8 mg/dL (ref 8.7–10.3)
Chloride: 97 mmol/L (ref 97–108)
Creatinine, Ser: 0.63 mg/dL (ref 0.57–1.00)
GFR calc Af Amer: 108 mL/min/{1.73_m2} (ref >59)
GFR calc non Af Amer: 94 mL/min/{1.73_m2} (ref >59)
Glucose: 83 mg/dL (ref 65–99)
Potassium: 4.7 mmol/L (ref 3.5–5.2)
Sodium: 137 mmol/L (ref 134–144)

## 2013-12-02 LAB — VITAMIN D 25 HYDROXY (VIT D DEFICIENCY, FRACTURES): Vit D, 25-Hydroxy: 46.7 ng/mL (ref 30.0–100.0)

## 2013-12-03 DIAGNOSIS — E119 Type 2 diabetes mellitus without complications: Secondary | ICD-10-CM | POA: Diagnosis not present

## 2013-12-03 DIAGNOSIS — I69391 Dysphagia following cerebral infarction: Secondary | ICD-10-CM | POA: Diagnosis not present

## 2013-12-03 DIAGNOSIS — I4891 Unspecified atrial fibrillation: Secondary | ICD-10-CM | POA: Diagnosis not present

## 2013-12-03 DIAGNOSIS — R1310 Dysphagia, unspecified: Secondary | ICD-10-CM | POA: Diagnosis not present

## 2013-12-03 DIAGNOSIS — F329 Major depressive disorder, single episode, unspecified: Secondary | ICD-10-CM | POA: Diagnosis not present

## 2013-12-03 DIAGNOSIS — I69353 Hemiplegia and hemiparesis following cerebral infarction affecting right non-dominant side: Secondary | ICD-10-CM | POA: Diagnosis not present

## 2013-12-04 DIAGNOSIS — I10 Essential (primary) hypertension: Secondary | ICD-10-CM | POA: Diagnosis not present

## 2013-12-04 DIAGNOSIS — R2689 Other abnormalities of gait and mobility: Secondary | ICD-10-CM | POA: Diagnosis not present

## 2013-12-04 DIAGNOSIS — R5383 Other fatigue: Secondary | ICD-10-CM | POA: Diagnosis not present

## 2013-12-04 DIAGNOSIS — Z23 Encounter for immunization: Secondary | ICD-10-CM | POA: Diagnosis not present

## 2013-12-04 DIAGNOSIS — F411 Generalized anxiety disorder: Secondary | ICD-10-CM | POA: Diagnosis not present

## 2013-12-04 DIAGNOSIS — R630 Anorexia: Secondary | ICD-10-CM | POA: Diagnosis not present

## 2013-12-04 DIAGNOSIS — G40109 Localization-related (focal) (partial) symptomatic epilepsy and epileptic syndromes with simple partial seizures, not intractable, without status epilepticus: Secondary | ICD-10-CM | POA: Diagnosis not present

## 2013-12-04 DIAGNOSIS — I639 Cerebral infarction, unspecified: Secondary | ICD-10-CM | POA: Diagnosis not present

## 2013-12-04 DIAGNOSIS — R231 Pallor: Secondary | ICD-10-CM | POA: Diagnosis not present

## 2013-12-04 DIAGNOSIS — I6349 Cerebral infarction due to embolism of other cerebral artery: Secondary | ICD-10-CM | POA: Diagnosis not present

## 2013-12-04 NOTE — Addendum Note (Signed)
Addended by: Zannie Cove on: 12/04/2013 12:36 PM   Modules accepted: Orders

## 2013-12-05 NOTE — Telephone Encounter (Signed)
Nothing was typed/tmj 

## 2013-12-08 DIAGNOSIS — R1310 Dysphagia, unspecified: Secondary | ICD-10-CM | POA: Diagnosis not present

## 2013-12-08 DIAGNOSIS — I4891 Unspecified atrial fibrillation: Secondary | ICD-10-CM | POA: Diagnosis not present

## 2013-12-08 DIAGNOSIS — I69353 Hemiplegia and hemiparesis following cerebral infarction affecting right non-dominant side: Secondary | ICD-10-CM | POA: Diagnosis not present

## 2013-12-08 DIAGNOSIS — E119 Type 2 diabetes mellitus without complications: Secondary | ICD-10-CM | POA: Diagnosis not present

## 2013-12-08 DIAGNOSIS — F329 Major depressive disorder, single episode, unspecified: Secondary | ICD-10-CM | POA: Diagnosis not present

## 2013-12-08 DIAGNOSIS — I69391 Dysphagia following cerebral infarction: Secondary | ICD-10-CM | POA: Diagnosis not present

## 2013-12-09 ENCOUNTER — Other Ambulatory Visit: Payer: Self-pay

## 2013-12-09 DIAGNOSIS — I69353 Hemiplegia and hemiparesis following cerebral infarction affecting right non-dominant side: Secondary | ICD-10-CM | POA: Diagnosis not present

## 2013-12-09 DIAGNOSIS — I4891 Unspecified atrial fibrillation: Secondary | ICD-10-CM | POA: Diagnosis not present

## 2013-12-09 DIAGNOSIS — F329 Major depressive disorder, single episode, unspecified: Secondary | ICD-10-CM | POA: Diagnosis not present

## 2013-12-09 DIAGNOSIS — I69391 Dysphagia following cerebral infarction: Secondary | ICD-10-CM | POA: Diagnosis not present

## 2013-12-09 DIAGNOSIS — R1310 Dysphagia, unspecified: Secondary | ICD-10-CM | POA: Diagnosis not present

## 2013-12-09 DIAGNOSIS — E119 Type 2 diabetes mellitus without complications: Secondary | ICD-10-CM | POA: Diagnosis not present

## 2013-12-10 ENCOUNTER — Encounter: Payer: Medicare Other | Admitting: Internal Medicine

## 2013-12-11 DIAGNOSIS — E119 Type 2 diabetes mellitus without complications: Secondary | ICD-10-CM | POA: Diagnosis not present

## 2013-12-11 DIAGNOSIS — I69353 Hemiplegia and hemiparesis following cerebral infarction affecting right non-dominant side: Secondary | ICD-10-CM | POA: Diagnosis not present

## 2013-12-11 DIAGNOSIS — F329 Major depressive disorder, single episode, unspecified: Secondary | ICD-10-CM | POA: Diagnosis not present

## 2013-12-11 DIAGNOSIS — I69391 Dysphagia following cerebral infarction: Secondary | ICD-10-CM | POA: Diagnosis not present

## 2013-12-11 DIAGNOSIS — I4891 Unspecified atrial fibrillation: Secondary | ICD-10-CM | POA: Diagnosis not present

## 2013-12-11 DIAGNOSIS — R1310 Dysphagia, unspecified: Secondary | ICD-10-CM | POA: Diagnosis not present

## 2013-12-12 DIAGNOSIS — E119 Type 2 diabetes mellitus without complications: Secondary | ICD-10-CM | POA: Diagnosis not present

## 2013-12-12 DIAGNOSIS — I4891 Unspecified atrial fibrillation: Secondary | ICD-10-CM | POA: Diagnosis not present

## 2013-12-12 DIAGNOSIS — R1310 Dysphagia, unspecified: Secondary | ICD-10-CM | POA: Diagnosis not present

## 2013-12-12 DIAGNOSIS — F329 Major depressive disorder, single episode, unspecified: Secondary | ICD-10-CM | POA: Diagnosis not present

## 2013-12-12 DIAGNOSIS — I69391 Dysphagia following cerebral infarction: Secondary | ICD-10-CM | POA: Diagnosis not present

## 2013-12-12 DIAGNOSIS — I69353 Hemiplegia and hemiparesis following cerebral infarction affecting right non-dominant side: Secondary | ICD-10-CM | POA: Diagnosis not present

## 2013-12-15 DIAGNOSIS — I4891 Unspecified atrial fibrillation: Secondary | ICD-10-CM | POA: Diagnosis not present

## 2013-12-15 DIAGNOSIS — I69353 Hemiplegia and hemiparesis following cerebral infarction affecting right non-dominant side: Secondary | ICD-10-CM | POA: Diagnosis not present

## 2013-12-15 DIAGNOSIS — F329 Major depressive disorder, single episode, unspecified: Secondary | ICD-10-CM | POA: Diagnosis not present

## 2013-12-15 DIAGNOSIS — I69391 Dysphagia following cerebral infarction: Secondary | ICD-10-CM | POA: Diagnosis not present

## 2013-12-15 DIAGNOSIS — R1310 Dysphagia, unspecified: Secondary | ICD-10-CM | POA: Diagnosis not present

## 2013-12-15 DIAGNOSIS — E119 Type 2 diabetes mellitus without complications: Secondary | ICD-10-CM | POA: Diagnosis not present

## 2013-12-16 ENCOUNTER — Other Ambulatory Visit: Payer: Self-pay | Admitting: Family Medicine

## 2013-12-17 DIAGNOSIS — I69353 Hemiplegia and hemiparesis following cerebral infarction affecting right non-dominant side: Secondary | ICD-10-CM | POA: Diagnosis not present

## 2013-12-17 DIAGNOSIS — R1310 Dysphagia, unspecified: Secondary | ICD-10-CM | POA: Diagnosis not present

## 2013-12-17 DIAGNOSIS — F329 Major depressive disorder, single episode, unspecified: Secondary | ICD-10-CM | POA: Diagnosis not present

## 2013-12-17 DIAGNOSIS — E119 Type 2 diabetes mellitus without complications: Secondary | ICD-10-CM | POA: Diagnosis not present

## 2013-12-17 DIAGNOSIS — I4891 Unspecified atrial fibrillation: Secondary | ICD-10-CM | POA: Diagnosis not present

## 2013-12-17 DIAGNOSIS — I69391 Dysphagia following cerebral infarction: Secondary | ICD-10-CM | POA: Diagnosis not present

## 2013-12-18 DIAGNOSIS — F329 Major depressive disorder, single episode, unspecified: Secondary | ICD-10-CM | POA: Diagnosis not present

## 2013-12-18 DIAGNOSIS — I4891 Unspecified atrial fibrillation: Secondary | ICD-10-CM | POA: Diagnosis not present

## 2013-12-18 DIAGNOSIS — E119 Type 2 diabetes mellitus without complications: Secondary | ICD-10-CM | POA: Diagnosis not present

## 2013-12-18 DIAGNOSIS — R1310 Dysphagia, unspecified: Secondary | ICD-10-CM | POA: Diagnosis not present

## 2013-12-18 DIAGNOSIS — I69391 Dysphagia following cerebral infarction: Secondary | ICD-10-CM | POA: Diagnosis not present

## 2013-12-18 DIAGNOSIS — I69353 Hemiplegia and hemiparesis following cerebral infarction affecting right non-dominant side: Secondary | ICD-10-CM | POA: Diagnosis not present

## 2013-12-19 DIAGNOSIS — E119 Type 2 diabetes mellitus without complications: Secondary | ICD-10-CM | POA: Diagnosis not present

## 2013-12-19 DIAGNOSIS — R1310 Dysphagia, unspecified: Secondary | ICD-10-CM | POA: Diagnosis not present

## 2013-12-19 DIAGNOSIS — I69391 Dysphagia following cerebral infarction: Secondary | ICD-10-CM | POA: Diagnosis not present

## 2013-12-19 DIAGNOSIS — F329 Major depressive disorder, single episode, unspecified: Secondary | ICD-10-CM | POA: Diagnosis not present

## 2013-12-19 DIAGNOSIS — I4891 Unspecified atrial fibrillation: Secondary | ICD-10-CM | POA: Diagnosis not present

## 2013-12-19 DIAGNOSIS — I69353 Hemiplegia and hemiparesis following cerebral infarction affecting right non-dominant side: Secondary | ICD-10-CM | POA: Diagnosis not present

## 2013-12-22 DIAGNOSIS — I69391 Dysphagia following cerebral infarction: Secondary | ICD-10-CM | POA: Diagnosis not present

## 2013-12-22 DIAGNOSIS — E119 Type 2 diabetes mellitus without complications: Secondary | ICD-10-CM | POA: Diagnosis not present

## 2013-12-22 DIAGNOSIS — F329 Major depressive disorder, single episode, unspecified: Secondary | ICD-10-CM | POA: Diagnosis not present

## 2013-12-22 DIAGNOSIS — I69353 Hemiplegia and hemiparesis following cerebral infarction affecting right non-dominant side: Secondary | ICD-10-CM | POA: Diagnosis not present

## 2013-12-22 DIAGNOSIS — I4891 Unspecified atrial fibrillation: Secondary | ICD-10-CM | POA: Diagnosis not present

## 2013-12-22 DIAGNOSIS — R1310 Dysphagia, unspecified: Secondary | ICD-10-CM | POA: Diagnosis not present

## 2013-12-24 DIAGNOSIS — I4891 Unspecified atrial fibrillation: Secondary | ICD-10-CM | POA: Diagnosis not present

## 2013-12-24 DIAGNOSIS — F329 Major depressive disorder, single episode, unspecified: Secondary | ICD-10-CM | POA: Diagnosis not present

## 2013-12-24 DIAGNOSIS — I69353 Hemiplegia and hemiparesis following cerebral infarction affecting right non-dominant side: Secondary | ICD-10-CM | POA: Diagnosis not present

## 2013-12-24 DIAGNOSIS — E119 Type 2 diabetes mellitus without complications: Secondary | ICD-10-CM | POA: Diagnosis not present

## 2013-12-24 DIAGNOSIS — I69391 Dysphagia following cerebral infarction: Secondary | ICD-10-CM | POA: Diagnosis not present

## 2013-12-24 DIAGNOSIS — R1310 Dysphagia, unspecified: Secondary | ICD-10-CM | POA: Diagnosis not present

## 2013-12-25 DIAGNOSIS — I69353 Hemiplegia and hemiparesis following cerebral infarction affecting right non-dominant side: Secondary | ICD-10-CM | POA: Diagnosis not present

## 2013-12-25 DIAGNOSIS — R1310 Dysphagia, unspecified: Secondary | ICD-10-CM | POA: Diagnosis not present

## 2013-12-25 DIAGNOSIS — I69391 Dysphagia following cerebral infarction: Secondary | ICD-10-CM | POA: Diagnosis not present

## 2013-12-25 DIAGNOSIS — I4891 Unspecified atrial fibrillation: Secondary | ICD-10-CM | POA: Diagnosis not present

## 2013-12-25 DIAGNOSIS — F329 Major depressive disorder, single episode, unspecified: Secondary | ICD-10-CM | POA: Diagnosis not present

## 2013-12-25 DIAGNOSIS — E119 Type 2 diabetes mellitus without complications: Secondary | ICD-10-CM | POA: Diagnosis not present

## 2013-12-26 DIAGNOSIS — R1310 Dysphagia, unspecified: Secondary | ICD-10-CM | POA: Diagnosis not present

## 2013-12-26 DIAGNOSIS — I69391 Dysphagia following cerebral infarction: Secondary | ICD-10-CM | POA: Diagnosis not present

## 2013-12-26 DIAGNOSIS — E119 Type 2 diabetes mellitus without complications: Secondary | ICD-10-CM | POA: Diagnosis not present

## 2013-12-26 DIAGNOSIS — I69353 Hemiplegia and hemiparesis following cerebral infarction affecting right non-dominant side: Secondary | ICD-10-CM | POA: Diagnosis not present

## 2013-12-26 DIAGNOSIS — F329 Major depressive disorder, single episode, unspecified: Secondary | ICD-10-CM | POA: Diagnosis not present

## 2013-12-26 DIAGNOSIS — I4891 Unspecified atrial fibrillation: Secondary | ICD-10-CM | POA: Diagnosis not present

## 2013-12-29 DIAGNOSIS — R1310 Dysphagia, unspecified: Secondary | ICD-10-CM | POA: Diagnosis not present

## 2013-12-29 DIAGNOSIS — I69353 Hemiplegia and hemiparesis following cerebral infarction affecting right non-dominant side: Secondary | ICD-10-CM | POA: Diagnosis not present

## 2013-12-29 DIAGNOSIS — F329 Major depressive disorder, single episode, unspecified: Secondary | ICD-10-CM | POA: Diagnosis not present

## 2013-12-29 DIAGNOSIS — I4891 Unspecified atrial fibrillation: Secondary | ICD-10-CM | POA: Diagnosis not present

## 2013-12-29 DIAGNOSIS — E119 Type 2 diabetes mellitus without complications: Secondary | ICD-10-CM | POA: Diagnosis not present

## 2013-12-29 DIAGNOSIS — I69391 Dysphagia following cerebral infarction: Secondary | ICD-10-CM | POA: Diagnosis not present

## 2013-12-30 DIAGNOSIS — Z950 Presence of cardiac pacemaker: Secondary | ICD-10-CM | POA: Diagnosis not present

## 2013-12-30 DIAGNOSIS — F329 Major depressive disorder, single episode, unspecified: Secondary | ICD-10-CM | POA: Diagnosis not present

## 2013-12-30 DIAGNOSIS — I69353 Hemiplegia and hemiparesis following cerebral infarction affecting right non-dominant side: Secondary | ICD-10-CM | POA: Diagnosis not present

## 2013-12-30 DIAGNOSIS — R1310 Dysphagia, unspecified: Secondary | ICD-10-CM | POA: Diagnosis not present

## 2013-12-30 DIAGNOSIS — G629 Polyneuropathy, unspecified: Secondary | ICD-10-CM | POA: Diagnosis not present

## 2013-12-30 DIAGNOSIS — E119 Type 2 diabetes mellitus without complications: Secondary | ICD-10-CM | POA: Diagnosis not present

## 2013-12-30 DIAGNOSIS — Z7901 Long term (current) use of anticoagulants: Secondary | ICD-10-CM | POA: Diagnosis not present

## 2013-12-30 DIAGNOSIS — I4891 Unspecified atrial fibrillation: Secondary | ICD-10-CM | POA: Diagnosis not present

## 2013-12-30 DIAGNOSIS — I69391 Dysphagia following cerebral infarction: Secondary | ICD-10-CM | POA: Diagnosis not present

## 2013-12-30 DIAGNOSIS — Z794 Long term (current) use of insulin: Secondary | ICD-10-CM | POA: Diagnosis not present

## 2013-12-30 DIAGNOSIS — I10 Essential (primary) hypertension: Secondary | ICD-10-CM | POA: Diagnosis not present

## 2013-12-31 DIAGNOSIS — I69391 Dysphagia following cerebral infarction: Secondary | ICD-10-CM | POA: Diagnosis not present

## 2013-12-31 DIAGNOSIS — E119 Type 2 diabetes mellitus without complications: Secondary | ICD-10-CM | POA: Diagnosis not present

## 2013-12-31 DIAGNOSIS — F329 Major depressive disorder, single episode, unspecified: Secondary | ICD-10-CM | POA: Diagnosis not present

## 2013-12-31 DIAGNOSIS — I4891 Unspecified atrial fibrillation: Secondary | ICD-10-CM | POA: Diagnosis not present

## 2013-12-31 DIAGNOSIS — I69353 Hemiplegia and hemiparesis following cerebral infarction affecting right non-dominant side: Secondary | ICD-10-CM | POA: Diagnosis not present

## 2013-12-31 DIAGNOSIS — R1310 Dysphagia, unspecified: Secondary | ICD-10-CM | POA: Diagnosis not present

## 2014-01-01 DIAGNOSIS — I69391 Dysphagia following cerebral infarction: Secondary | ICD-10-CM | POA: Diagnosis not present

## 2014-01-01 DIAGNOSIS — R1310 Dysphagia, unspecified: Secondary | ICD-10-CM | POA: Diagnosis not present

## 2014-01-01 DIAGNOSIS — I69353 Hemiplegia and hemiparesis following cerebral infarction affecting right non-dominant side: Secondary | ICD-10-CM | POA: Diagnosis not present

## 2014-01-01 DIAGNOSIS — I4891 Unspecified atrial fibrillation: Secondary | ICD-10-CM | POA: Diagnosis not present

## 2014-01-01 DIAGNOSIS — E119 Type 2 diabetes mellitus without complications: Secondary | ICD-10-CM | POA: Diagnosis not present

## 2014-01-01 DIAGNOSIS — F329 Major depressive disorder, single episode, unspecified: Secondary | ICD-10-CM | POA: Diagnosis not present

## 2014-01-02 DIAGNOSIS — F329 Major depressive disorder, single episode, unspecified: Secondary | ICD-10-CM | POA: Diagnosis not present

## 2014-01-02 DIAGNOSIS — I69391 Dysphagia following cerebral infarction: Secondary | ICD-10-CM | POA: Diagnosis not present

## 2014-01-02 DIAGNOSIS — R1310 Dysphagia, unspecified: Secondary | ICD-10-CM | POA: Diagnosis not present

## 2014-01-02 DIAGNOSIS — E119 Type 2 diabetes mellitus without complications: Secondary | ICD-10-CM | POA: Diagnosis not present

## 2014-01-02 DIAGNOSIS — I4891 Unspecified atrial fibrillation: Secondary | ICD-10-CM | POA: Diagnosis not present

## 2014-01-02 DIAGNOSIS — I69353 Hemiplegia and hemiparesis following cerebral infarction affecting right non-dominant side: Secondary | ICD-10-CM | POA: Diagnosis not present

## 2014-01-05 ENCOUNTER — Telehealth: Payer: Self-pay | Admitting: Family Medicine

## 2014-01-05 NOTE — Telephone Encounter (Signed)
Call given to home health °

## 2014-01-06 DIAGNOSIS — E118 Type 2 diabetes mellitus with unspecified complications: Secondary | ICD-10-CM | POA: Diagnosis not present

## 2014-01-06 DIAGNOSIS — I639 Cerebral infarction, unspecified: Secondary | ICD-10-CM | POA: Diagnosis not present

## 2014-01-06 DIAGNOSIS — R569 Unspecified convulsions: Secondary | ICD-10-CM | POA: Diagnosis not present

## 2014-01-06 DIAGNOSIS — E119 Type 2 diabetes mellitus without complications: Secondary | ICD-10-CM | POA: Diagnosis not present

## 2014-01-06 DIAGNOSIS — I4891 Unspecified atrial fibrillation: Secondary | ICD-10-CM | POA: Diagnosis not present

## 2014-01-06 DIAGNOSIS — R1310 Dysphagia, unspecified: Secondary | ICD-10-CM | POA: Diagnosis not present

## 2014-01-06 DIAGNOSIS — I69391 Dysphagia following cerebral infarction: Secondary | ICD-10-CM | POA: Diagnosis not present

## 2014-01-06 DIAGNOSIS — R2689 Other abnormalities of gait and mobility: Secondary | ICD-10-CM | POA: Diagnosis not present

## 2014-01-06 DIAGNOSIS — F329 Major depressive disorder, single episode, unspecified: Secondary | ICD-10-CM | POA: Diagnosis not present

## 2014-01-06 DIAGNOSIS — I69353 Hemiplegia and hemiparesis following cerebral infarction affecting right non-dominant side: Secondary | ICD-10-CM | POA: Diagnosis not present

## 2014-01-08 DIAGNOSIS — I69391 Dysphagia following cerebral infarction: Secondary | ICD-10-CM | POA: Diagnosis not present

## 2014-01-08 DIAGNOSIS — I4891 Unspecified atrial fibrillation: Secondary | ICD-10-CM | POA: Diagnosis not present

## 2014-01-08 DIAGNOSIS — E119 Type 2 diabetes mellitus without complications: Secondary | ICD-10-CM | POA: Diagnosis not present

## 2014-01-08 DIAGNOSIS — I69353 Hemiplegia and hemiparesis following cerebral infarction affecting right non-dominant side: Secondary | ICD-10-CM | POA: Diagnosis not present

## 2014-01-08 DIAGNOSIS — R1310 Dysphagia, unspecified: Secondary | ICD-10-CM | POA: Diagnosis not present

## 2014-01-08 DIAGNOSIS — F329 Major depressive disorder, single episode, unspecified: Secondary | ICD-10-CM | POA: Diagnosis not present

## 2014-01-09 DIAGNOSIS — R1310 Dysphagia, unspecified: Secondary | ICD-10-CM | POA: Diagnosis not present

## 2014-01-09 DIAGNOSIS — I4891 Unspecified atrial fibrillation: Secondary | ICD-10-CM | POA: Diagnosis not present

## 2014-01-09 DIAGNOSIS — F329 Major depressive disorder, single episode, unspecified: Secondary | ICD-10-CM | POA: Diagnosis not present

## 2014-01-09 DIAGNOSIS — I69353 Hemiplegia and hemiparesis following cerebral infarction affecting right non-dominant side: Secondary | ICD-10-CM | POA: Diagnosis not present

## 2014-01-09 DIAGNOSIS — E119 Type 2 diabetes mellitus without complications: Secondary | ICD-10-CM | POA: Diagnosis not present

## 2014-01-09 DIAGNOSIS — I69391 Dysphagia following cerebral infarction: Secondary | ICD-10-CM | POA: Diagnosis not present

## 2014-01-12 ENCOUNTER — Telehealth: Payer: Self-pay | Admitting: Family Medicine

## 2014-01-12 DIAGNOSIS — I69353 Hemiplegia and hemiparesis following cerebral infarction affecting right non-dominant side: Secondary | ICD-10-CM | POA: Diagnosis not present

## 2014-01-12 DIAGNOSIS — F329 Major depressive disorder, single episode, unspecified: Secondary | ICD-10-CM | POA: Diagnosis not present

## 2014-01-12 DIAGNOSIS — I4891 Unspecified atrial fibrillation: Secondary | ICD-10-CM | POA: Diagnosis not present

## 2014-01-12 DIAGNOSIS — E119 Type 2 diabetes mellitus without complications: Secondary | ICD-10-CM | POA: Diagnosis not present

## 2014-01-12 DIAGNOSIS — I69391 Dysphagia following cerebral infarction: Secondary | ICD-10-CM | POA: Diagnosis not present

## 2014-01-12 DIAGNOSIS — R1310 Dysphagia, unspecified: Secondary | ICD-10-CM | POA: Diagnosis not present

## 2014-01-12 NOTE — Telephone Encounter (Signed)
Discontinued blood sugar pill and while off of the pill please monitor blood sugars much more regularly. May consider retrying Megace Discontinued blood sugar pill first

## 2014-01-12 NOTE — Telephone Encounter (Signed)
Husband aware  He will be in touch in 2-3 weeks .

## 2014-01-12 NOTE — Telephone Encounter (Signed)
1- Sugar running 76, 77 , etc... consistently running lower that the 105 range it was running at the last office visit. Do you think we should cut out the Metformin - she is currently only 1/2 tab once a day (250 mg total) Home health nurse - wonders if we can stop it.    2-  She is still not eating hardly anything- has no appetite (has tried Megace pill at nursing facility - caused problems - so they stopped it) Getting weak and husband thinks she is more frail

## 2014-01-12 NOTE — Telephone Encounter (Signed)
Please give the patient's husband to call and see what he is requesting.

## 2014-01-13 ENCOUNTER — Other Ambulatory Visit: Payer: Self-pay | Admitting: Family Medicine

## 2014-01-13 DIAGNOSIS — I69353 Hemiplegia and hemiparesis following cerebral infarction affecting right non-dominant side: Secondary | ICD-10-CM | POA: Diagnosis not present

## 2014-01-13 DIAGNOSIS — I69391 Dysphagia following cerebral infarction: Secondary | ICD-10-CM | POA: Diagnosis not present

## 2014-01-13 DIAGNOSIS — E119 Type 2 diabetes mellitus without complications: Secondary | ICD-10-CM | POA: Diagnosis not present

## 2014-01-13 DIAGNOSIS — I4891 Unspecified atrial fibrillation: Secondary | ICD-10-CM | POA: Diagnosis not present

## 2014-01-13 DIAGNOSIS — F329 Major depressive disorder, single episode, unspecified: Secondary | ICD-10-CM | POA: Diagnosis not present

## 2014-01-13 DIAGNOSIS — R1310 Dysphagia, unspecified: Secondary | ICD-10-CM | POA: Diagnosis not present

## 2014-01-19 DIAGNOSIS — F329 Major depressive disorder, single episode, unspecified: Secondary | ICD-10-CM | POA: Diagnosis not present

## 2014-01-19 DIAGNOSIS — E119 Type 2 diabetes mellitus without complications: Secondary | ICD-10-CM | POA: Diagnosis not present

## 2014-01-19 DIAGNOSIS — I69391 Dysphagia following cerebral infarction: Secondary | ICD-10-CM | POA: Diagnosis not present

## 2014-01-19 DIAGNOSIS — I4891 Unspecified atrial fibrillation: Secondary | ICD-10-CM | POA: Diagnosis not present

## 2014-01-19 DIAGNOSIS — R1310 Dysphagia, unspecified: Secondary | ICD-10-CM | POA: Diagnosis not present

## 2014-01-19 DIAGNOSIS — I69353 Hemiplegia and hemiparesis following cerebral infarction affecting right non-dominant side: Secondary | ICD-10-CM

## 2014-01-21 DIAGNOSIS — R1310 Dysphagia, unspecified: Secondary | ICD-10-CM | POA: Diagnosis not present

## 2014-01-21 DIAGNOSIS — I4891 Unspecified atrial fibrillation: Secondary | ICD-10-CM | POA: Diagnosis not present

## 2014-01-21 DIAGNOSIS — I69353 Hemiplegia and hemiparesis following cerebral infarction affecting right non-dominant side: Secondary | ICD-10-CM | POA: Diagnosis not present

## 2014-01-21 DIAGNOSIS — I69391 Dysphagia following cerebral infarction: Secondary | ICD-10-CM | POA: Diagnosis not present

## 2014-01-21 DIAGNOSIS — F329 Major depressive disorder, single episode, unspecified: Secondary | ICD-10-CM | POA: Diagnosis not present

## 2014-01-21 DIAGNOSIS — E119 Type 2 diabetes mellitus without complications: Secondary | ICD-10-CM | POA: Diagnosis not present

## 2014-01-22 DIAGNOSIS — R1310 Dysphagia, unspecified: Secondary | ICD-10-CM | POA: Diagnosis not present

## 2014-01-22 DIAGNOSIS — E119 Type 2 diabetes mellitus without complications: Secondary | ICD-10-CM | POA: Diagnosis not present

## 2014-01-22 DIAGNOSIS — I69353 Hemiplegia and hemiparesis following cerebral infarction affecting right non-dominant side: Secondary | ICD-10-CM | POA: Diagnosis not present

## 2014-01-22 DIAGNOSIS — I69391 Dysphagia following cerebral infarction: Secondary | ICD-10-CM | POA: Diagnosis not present

## 2014-01-22 DIAGNOSIS — F329 Major depressive disorder, single episode, unspecified: Secondary | ICD-10-CM | POA: Diagnosis not present

## 2014-01-22 DIAGNOSIS — I4891 Unspecified atrial fibrillation: Secondary | ICD-10-CM | POA: Diagnosis not present

## 2014-01-23 DIAGNOSIS — I4891 Unspecified atrial fibrillation: Secondary | ICD-10-CM | POA: Diagnosis not present

## 2014-01-23 DIAGNOSIS — I69391 Dysphagia following cerebral infarction: Secondary | ICD-10-CM | POA: Diagnosis not present

## 2014-01-23 DIAGNOSIS — E119 Type 2 diabetes mellitus without complications: Secondary | ICD-10-CM | POA: Diagnosis not present

## 2014-01-23 DIAGNOSIS — F329 Major depressive disorder, single episode, unspecified: Secondary | ICD-10-CM | POA: Diagnosis not present

## 2014-01-23 DIAGNOSIS — R1310 Dysphagia, unspecified: Secondary | ICD-10-CM | POA: Diagnosis not present

## 2014-01-23 DIAGNOSIS — I69353 Hemiplegia and hemiparesis following cerebral infarction affecting right non-dominant side: Secondary | ICD-10-CM | POA: Diagnosis not present

## 2014-01-24 ENCOUNTER — Other Ambulatory Visit: Payer: Self-pay | Admitting: Internal Medicine

## 2014-01-26 ENCOUNTER — Other Ambulatory Visit: Payer: Self-pay | Admitting: Family Medicine

## 2014-01-26 ENCOUNTER — Telehealth: Payer: Self-pay | Admitting: Family Medicine

## 2014-01-26 NOTE — Telephone Encounter (Signed)
After questions about area, he decided it may have come from a tight fitting bra he had helped her into during the week end when preparing for an outting.  He will observe area and do comfort measures.  He will call in AM if problem worsens.

## 2014-01-26 NOTE — Telephone Encounter (Signed)
May need to be seen .  Call back to talk to [place] or come in to see triage.

## 2014-01-27 DIAGNOSIS — I4891 Unspecified atrial fibrillation: Secondary | ICD-10-CM | POA: Diagnosis not present

## 2014-01-27 DIAGNOSIS — I69353 Hemiplegia and hemiparesis following cerebral infarction affecting right non-dominant side: Secondary | ICD-10-CM | POA: Diagnosis not present

## 2014-01-27 DIAGNOSIS — R1310 Dysphagia, unspecified: Secondary | ICD-10-CM | POA: Diagnosis not present

## 2014-01-27 DIAGNOSIS — F329 Major depressive disorder, single episode, unspecified: Secondary | ICD-10-CM | POA: Diagnosis not present

## 2014-01-27 DIAGNOSIS — I69391 Dysphagia following cerebral infarction: Secondary | ICD-10-CM | POA: Diagnosis not present

## 2014-01-27 DIAGNOSIS — E119 Type 2 diabetes mellitus without complications: Secondary | ICD-10-CM | POA: Diagnosis not present

## 2014-01-28 NOTE — Telephone Encounter (Signed)
Stp's husband, Darothy has UTI and has an appt with Dr. Jeffie Pollock (urologist) on Friday, advised to call Dr.Wrenn to see if he would like her to come in sooner or just keep appt, pharmacy switched in system and advised to call old pharmacy to have RX transferred. Pt voiced understanding.

## 2014-01-29 ENCOUNTER — Encounter (HOSPITAL_COMMUNITY): Payer: Self-pay | Admitting: Internal Medicine

## 2014-01-29 DIAGNOSIS — I69391 Dysphagia following cerebral infarction: Secondary | ICD-10-CM | POA: Diagnosis not present

## 2014-01-29 DIAGNOSIS — I69353 Hemiplegia and hemiparesis following cerebral infarction affecting right non-dominant side: Secondary | ICD-10-CM | POA: Diagnosis not present

## 2014-01-29 DIAGNOSIS — F329 Major depressive disorder, single episode, unspecified: Secondary | ICD-10-CM | POA: Diagnosis not present

## 2014-01-29 DIAGNOSIS — E119 Type 2 diabetes mellitus without complications: Secondary | ICD-10-CM | POA: Diagnosis not present

## 2014-01-29 DIAGNOSIS — R569 Unspecified convulsions: Secondary | ICD-10-CM | POA: Diagnosis not present

## 2014-01-29 DIAGNOSIS — R1310 Dysphagia, unspecified: Secondary | ICD-10-CM | POA: Diagnosis not present

## 2014-01-29 DIAGNOSIS — I4891 Unspecified atrial fibrillation: Secondary | ICD-10-CM | POA: Diagnosis not present

## 2014-01-30 ENCOUNTER — Ambulatory Visit (INDEPENDENT_AMBULATORY_CARE_PROVIDER_SITE_OTHER): Payer: Medicare Other | Admitting: Urology

## 2014-01-30 DIAGNOSIS — I69353 Hemiplegia and hemiparesis following cerebral infarction affecting right non-dominant side: Secondary | ICD-10-CM | POA: Diagnosis not present

## 2014-01-30 DIAGNOSIS — N3941 Urge incontinence: Secondary | ICD-10-CM

## 2014-01-30 DIAGNOSIS — N390 Urinary tract infection, site not specified: Secondary | ICD-10-CM

## 2014-01-30 DIAGNOSIS — R3915 Urgency of urination: Secondary | ICD-10-CM | POA: Diagnosis not present

## 2014-01-30 DIAGNOSIS — F329 Major depressive disorder, single episode, unspecified: Secondary | ICD-10-CM | POA: Diagnosis not present

## 2014-01-30 DIAGNOSIS — I69391 Dysphagia following cerebral infarction: Secondary | ICD-10-CM | POA: Diagnosis not present

## 2014-01-30 DIAGNOSIS — E119 Type 2 diabetes mellitus without complications: Secondary | ICD-10-CM | POA: Diagnosis not present

## 2014-01-30 DIAGNOSIS — I4891 Unspecified atrial fibrillation: Secondary | ICD-10-CM | POA: Diagnosis not present

## 2014-01-30 DIAGNOSIS — R1310 Dysphagia, unspecified: Secondary | ICD-10-CM | POA: Diagnosis not present

## 2014-01-31 ENCOUNTER — Ambulatory Visit: Payer: Medicare Other

## 2014-01-31 ENCOUNTER — Telehealth: Payer: Self-pay | Admitting: Family Medicine

## 2014-02-03 ENCOUNTER — Telehealth: Payer: Self-pay | Admitting: *Deleted

## 2014-02-03 MED ORDER — OMEPRAZOLE 40 MG PO CPDR: 40.0000 mg | DELAYED_RELEASE_CAPSULE | Freq: Every day | ORAL | Status: DC

## 2014-02-03 NOTE — Telephone Encounter (Signed)
Prevacid is no longer covered by insurance  Omeprazole is covered - can we change this and if so - what strength?  Send to [facility] that is in pt's file

## 2014-02-03 NOTE — Telephone Encounter (Signed)
Change to 40 mg of omeprazole

## 2014-02-03 NOTE — Telephone Encounter (Signed)
This was for a rash, but now pt and husband states this is better and they do not need the appt

## 2014-02-03 NOTE — Addendum Note (Signed)
Addended by: Zannie Cove on: 02/03/2014 01:58 PM   Modules accepted: Orders

## 2014-02-03 NOTE — Telephone Encounter (Signed)
done

## 2014-02-04 DIAGNOSIS — F329 Major depressive disorder, single episode, unspecified: Secondary | ICD-10-CM | POA: Diagnosis not present

## 2014-02-04 DIAGNOSIS — I69391 Dysphagia following cerebral infarction: Secondary | ICD-10-CM | POA: Diagnosis not present

## 2014-02-04 DIAGNOSIS — I69353 Hemiplegia and hemiparesis following cerebral infarction affecting right non-dominant side: Secondary | ICD-10-CM | POA: Diagnosis not present

## 2014-02-04 DIAGNOSIS — R1310 Dysphagia, unspecified: Secondary | ICD-10-CM | POA: Diagnosis not present

## 2014-02-04 DIAGNOSIS — E119 Type 2 diabetes mellitus without complications: Secondary | ICD-10-CM | POA: Diagnosis not present

## 2014-02-04 DIAGNOSIS — I4891 Unspecified atrial fibrillation: Secondary | ICD-10-CM | POA: Diagnosis not present

## 2014-02-05 DIAGNOSIS — F329 Major depressive disorder, single episode, unspecified: Secondary | ICD-10-CM | POA: Diagnosis not present

## 2014-02-05 DIAGNOSIS — E119 Type 2 diabetes mellitus without complications: Secondary | ICD-10-CM | POA: Diagnosis not present

## 2014-02-05 DIAGNOSIS — I69353 Hemiplegia and hemiparesis following cerebral infarction affecting right non-dominant side: Secondary | ICD-10-CM | POA: Diagnosis not present

## 2014-02-05 DIAGNOSIS — R1310 Dysphagia, unspecified: Secondary | ICD-10-CM | POA: Diagnosis not present

## 2014-02-05 DIAGNOSIS — I69391 Dysphagia following cerebral infarction: Secondary | ICD-10-CM | POA: Diagnosis not present

## 2014-02-05 DIAGNOSIS — I4891 Unspecified atrial fibrillation: Secondary | ICD-10-CM | POA: Diagnosis not present

## 2014-02-06 DIAGNOSIS — R1310 Dysphagia, unspecified: Secondary | ICD-10-CM | POA: Diagnosis not present

## 2014-02-06 DIAGNOSIS — I69353 Hemiplegia and hemiparesis following cerebral infarction affecting right non-dominant side: Secondary | ICD-10-CM | POA: Diagnosis not present

## 2014-02-06 DIAGNOSIS — E119 Type 2 diabetes mellitus without complications: Secondary | ICD-10-CM | POA: Diagnosis not present

## 2014-02-06 DIAGNOSIS — I4891 Unspecified atrial fibrillation: Secondary | ICD-10-CM | POA: Diagnosis not present

## 2014-02-06 DIAGNOSIS — F329 Major depressive disorder, single episode, unspecified: Secondary | ICD-10-CM | POA: Diagnosis not present

## 2014-02-06 DIAGNOSIS — I69391 Dysphagia following cerebral infarction: Secondary | ICD-10-CM | POA: Diagnosis not present

## 2014-02-09 ENCOUNTER — Telehealth: Payer: Self-pay | Admitting: *Deleted

## 2014-02-09 DIAGNOSIS — I69391 Dysphagia following cerebral infarction: Secondary | ICD-10-CM | POA: Diagnosis not present

## 2014-02-09 DIAGNOSIS — E119 Type 2 diabetes mellitus without complications: Secondary | ICD-10-CM | POA: Diagnosis not present

## 2014-02-09 DIAGNOSIS — I4891 Unspecified atrial fibrillation: Secondary | ICD-10-CM | POA: Diagnosis not present

## 2014-02-09 DIAGNOSIS — R1310 Dysphagia, unspecified: Secondary | ICD-10-CM | POA: Diagnosis not present

## 2014-02-09 DIAGNOSIS — F329 Major depressive disorder, single episode, unspecified: Secondary | ICD-10-CM | POA: Diagnosis not present

## 2014-02-09 DIAGNOSIS — I69353 Hemiplegia and hemiparesis following cerebral infarction affecting right non-dominant side: Secondary | ICD-10-CM | POA: Diagnosis not present

## 2014-02-09 MED ORDER — RIVAROXABAN 20 MG PO TABS: 20.0000 mg | ORAL_TABLET | Freq: Every day | ORAL | Status: DC

## 2014-02-09 MED ORDER — LEVOTHYROXINE SODIUM 88 MCG PO TABS: ORAL_TABLET | ORAL | Status: DC

## 2014-02-09 MED ORDER — FLECAINIDE ACETATE 50 MG PO TABS: 50.0000 mg | ORAL_TABLET | Freq: Two times a day (BID) | ORAL | Status: DC

## 2014-02-09 MED ORDER — DICYCLOMINE HCL 10 MG PO CAPS: 10.0000 mg | ORAL_CAPSULE | Freq: Three times a day (TID) | ORAL | Status: DC

## 2014-02-09 MED ORDER — CITALOPRAM HYDROBROMIDE 20 MG PO TABS: 20.0000 mg | ORAL_TABLET | Freq: Every day | ORAL | Status: DC

## 2014-02-09 MED ORDER — OMEPRAZOLE 40 MG PO CPDR: 40.0000 mg | DELAYED_RELEASE_CAPSULE | Freq: Every day | ORAL | Status: DC

## 2014-02-09 MED ORDER — MIRTAZAPINE 15 MG PO TABS: ORAL_TABLET | ORAL | Status: DC

## 2014-02-09 MED ORDER — POTASSIUM CHLORIDE CRYS ER 20 MEQ PO TBCR: 40.0000 meq | EXTENDED_RELEASE_TABLET | Freq: Two times a day (BID) | ORAL | Status: DC

## 2014-02-09 MED ORDER — SIMVASTATIN 40 MG PO TABS: 40.0000 mg | ORAL_TABLET | Freq: Every day | ORAL | Status: DC

## 2014-02-09 MED ORDER — FLUTICASONE PROPIONATE 50 MCG/ACT NA SUSP: 2.0000 | Freq: Every day | NASAL | Status: DC

## 2014-02-09 MED ORDER — METOPROLOL TARTRATE 50 MG PO TABS: ORAL_TABLET | ORAL | Status: DC

## 2014-02-09 NOTE — Telephone Encounter (Signed)
Wanted all meds switched to [facility], called husband to verify meds and checked pts last ov .

## 2014-02-10 DIAGNOSIS — I4891 Unspecified atrial fibrillation: Secondary | ICD-10-CM | POA: Diagnosis not present

## 2014-02-10 DIAGNOSIS — I69391 Dysphagia following cerebral infarction: Secondary | ICD-10-CM | POA: Diagnosis not present

## 2014-02-10 DIAGNOSIS — F329 Major depressive disorder, single episode, unspecified: Secondary | ICD-10-CM | POA: Diagnosis not present

## 2014-02-10 DIAGNOSIS — E119 Type 2 diabetes mellitus without complications: Secondary | ICD-10-CM | POA: Diagnosis not present

## 2014-02-10 DIAGNOSIS — R1310 Dysphagia, unspecified: Secondary | ICD-10-CM | POA: Diagnosis not present

## 2014-02-10 DIAGNOSIS — I69353 Hemiplegia and hemiparesis following cerebral infarction affecting right non-dominant side: Secondary | ICD-10-CM | POA: Diagnosis not present

## 2014-02-16 DIAGNOSIS — E119 Type 2 diabetes mellitus without complications: Secondary | ICD-10-CM | POA: Diagnosis not present

## 2014-02-16 DIAGNOSIS — I69391 Dysphagia following cerebral infarction: Secondary | ICD-10-CM | POA: Diagnosis not present

## 2014-02-16 DIAGNOSIS — I69353 Hemiplegia and hemiparesis following cerebral infarction affecting right non-dominant side: Secondary | ICD-10-CM | POA: Diagnosis not present

## 2014-02-16 DIAGNOSIS — R1310 Dysphagia, unspecified: Secondary | ICD-10-CM | POA: Diagnosis not present

## 2014-02-16 DIAGNOSIS — I4891 Unspecified atrial fibrillation: Secondary | ICD-10-CM | POA: Diagnosis not present

## 2014-02-16 DIAGNOSIS — F329 Major depressive disorder, single episode, unspecified: Secondary | ICD-10-CM | POA: Diagnosis not present

## 2014-02-17 ENCOUNTER — Telehealth: Payer: Self-pay | Admitting: Family Medicine

## 2014-02-17 DIAGNOSIS — I69353 Hemiplegia and hemiparesis following cerebral infarction affecting right non-dominant side: Secondary | ICD-10-CM | POA: Diagnosis not present

## 2014-02-17 DIAGNOSIS — R1310 Dysphagia, unspecified: Secondary | ICD-10-CM | POA: Diagnosis not present

## 2014-02-17 DIAGNOSIS — I4891 Unspecified atrial fibrillation: Secondary | ICD-10-CM | POA: Diagnosis not present

## 2014-02-17 DIAGNOSIS — F329 Major depressive disorder, single episode, unspecified: Secondary | ICD-10-CM | POA: Diagnosis not present

## 2014-02-17 DIAGNOSIS — E119 Type 2 diabetes mellitus without complications: Secondary | ICD-10-CM | POA: Diagnosis not present

## 2014-02-17 DIAGNOSIS — I69391 Dysphagia following cerebral infarction: Secondary | ICD-10-CM | POA: Diagnosis not present

## 2014-02-17 DIAGNOSIS — I6389 Other cerebral infarction: Secondary | ICD-10-CM

## 2014-02-17 NOTE — Telephone Encounter (Signed)
Orders were placed

## 2014-02-17 NOTE — Telephone Encounter (Signed)
Patient has finished with home therapy.  She needs new referral to [facility] out patient for occupation,physical and speech therapies.  Please do as soon as possible.

## 2014-02-23 DIAGNOSIS — R2689 Other abnormalities of gait and mobility: Secondary | ICD-10-CM | POA: Diagnosis not present

## 2014-02-23 DIAGNOSIS — I639 Cerebral infarction, unspecified: Secondary | ICD-10-CM | POA: Diagnosis not present

## 2014-02-23 DIAGNOSIS — G40009 Localization-related (focal) (partial) idiopathic epilepsy and epileptic syndromes with seizures of localized onset, not intractable, without status epilepticus: Secondary | ICD-10-CM | POA: Diagnosis not present

## 2014-02-23 DIAGNOSIS — R4702 Dysphasia: Secondary | ICD-10-CM | POA: Diagnosis not present

## 2014-02-24 ENCOUNTER — Telehealth: Payer: Self-pay | Admitting: Family Medicine

## 2014-02-24 ENCOUNTER — Other Ambulatory Visit: Payer: Self-pay | Admitting: *Deleted

## 2014-02-24 MED ORDER — METHOTREXATE 2.5 MG PO TABS: ORAL_TABLET | ORAL | Status: DC

## 2014-02-24 NOTE — Telephone Encounter (Signed)
Refill on methotrexate sent in.

## 2014-02-24 NOTE — Telephone Encounter (Signed)
Please call this prescription in for patient as requested

## 2014-03-05 ENCOUNTER — Ambulatory Visit (INDEPENDENT_AMBULATORY_CARE_PROVIDER_SITE_OTHER): Payer: Medicare Other

## 2014-03-05 ENCOUNTER — Ambulatory Visit (INDEPENDENT_AMBULATORY_CARE_PROVIDER_SITE_OTHER): Payer: Medicare Other | Admitting: Family Medicine

## 2014-03-05 ENCOUNTER — Encounter: Payer: Self-pay | Admitting: Family Medicine

## 2014-03-05 VITALS — BP 151/77 | HR 60 | Temp 97.0°F | Ht 64.0 in | Wt 124.0 lb

## 2014-03-05 DIAGNOSIS — M545 Low back pain: Secondary | ICD-10-CM

## 2014-03-05 DIAGNOSIS — E1140 Type 2 diabetes mellitus with diabetic neuropathy, unspecified: Secondary | ICD-10-CM

## 2014-03-05 DIAGNOSIS — I6389 Other cerebral infarction: Secondary | ICD-10-CM

## 2014-03-05 DIAGNOSIS — E039 Hypothyroidism, unspecified: Secondary | ICD-10-CM | POA: Diagnosis not present

## 2014-03-05 DIAGNOSIS — I638 Other cerebral infarction: Secondary | ICD-10-CM

## 2014-03-05 DIAGNOSIS — E1142 Type 2 diabetes mellitus with diabetic polyneuropathy: Secondary | ICD-10-CM | POA: Diagnosis not present

## 2014-03-05 DIAGNOSIS — E559 Vitamin D deficiency, unspecified: Secondary | ICD-10-CM

## 2014-03-05 DIAGNOSIS — I10 Essential (primary) hypertension: Secondary | ICD-10-CM

## 2014-03-05 DIAGNOSIS — I480 Paroxysmal atrial fibrillation: Secondary | ICD-10-CM | POA: Diagnosis not present

## 2014-03-05 DIAGNOSIS — E785 Hyperlipidemia, unspecified: Secondary | ICD-10-CM | POA: Diagnosis not present

## 2014-03-05 LAB — POCT CBC
Granulocyte percent: 60.9 %G (ref 37–80)
HCT, POC: 38.6 % (ref 37.7–47.9)
Hemoglobin: 12.6 g/dL (ref 12.2–16.2)
Lymph, poc: 2.9 (ref 0.6–3.4)
MCH, POC: 30.3 pg (ref 27–31.2)
MCHC: 32.5 g/dL (ref 31.8–35.4)
MCV: 93.2 fL (ref 80–97)
MPV: 6.3 fL (ref 0–99.8)
POC Granulocyte: 5.8 (ref 2–6.9)
POC LYMPH PERCENT: 30.7 %L (ref 10–50)
Platelet Count, POC: 312 10*3/uL (ref 142–424)
RBC: 4.2 M/uL (ref 4.04–5.48)
RDW, POC: 16.9 %
WBC: 9.5 10*3/uL (ref 4.6–10.2)

## 2014-03-05 LAB — POCT GLYCOSYLATED HEMOGLOBIN (HGB A1C): Hemoglobin A1C: 5.2

## 2014-03-05 NOTE — Progress Notes (Signed)
Subjective:    Patient ID: [name], female    DOB: 1945/11/10, 69 y.o.   MRN: [id number]  HPI  Pt here for follow up and management of chronic medical problems which includes atrial fibrillation, hyperlipidemia, and seizure disorder. Her husband is in charge of her medications and states that they are taken regularly. The patient comes to the visit today with her 4 pronged walker. She has gained about 3 pounds of weight since the last visit. Her husband indicates that her appetite is improving by the day. She appears much more alert she is smiling and seems to be improving tremendously from when she was seen the last time. Her husband continues to give her encouragement and she seems to be responding well. She no longer has her daily caregiver. She has visits planned with her cardiologist with the neurologist and with physical therapy. Her husband says he will arrange to get her to see the ophthalmologist. Her home blood pressures are running in the 105 range over the 40s. This is consistent for her. Her blood sugars are running anywhere from 91-106 and this is any time throughout the day.         Patient Active Problem List   Diagnosis Date Noted  . Cerebral infarction due to embolism of other cerebral artery 09/03/2013  . Metabolic syndrome 92/12/9415  . High risk medication use 03/13/2013  . Fever, unspecified 06/05/2012  . Generalized abdominal pain 06/05/2012  . PAF (paroxysmal atrial fibrillation) 02/07/2012  . Tachy-brady syndrome   . Impaired glucose tolerance   . Fatigue 12/22/2009  . SNORING 12/22/2009  . OTHER CHRONIC NONALCOHOLIC LIVER DISEASE 40/81/4481  . NONSPEC ELEVATION OF LEVELS OF TRANSAMINASE/LDH 03/09/2009  . PPM-St.Jude 09/01/2008  . BRADYCARDIA 08/13/2008  . HYPOTHYROIDISM 06/09/2008  . HTN (hypertension) 06/09/2008  . Atrial fibrillation 06/09/2008  . PREMATURE VENTRICULAR CONTRACTIONS 06/09/2008  . IRRITABLE BOWEL SYNDROME 06/09/2008  . PSORIASIS  06/09/2008  . EDEMA 06/09/2008  . DIVERTICULOSIS, COLON, HX OF 06/09/2008   Outpatient Encounter Prescriptions as of 03/05/2014  Medication Sig  . acetaminophen (TYLENOL) 500 MG tablet Take 500 mg by mouth every 6 (six) hours as needed.  Marland Kitchen aspirin 81 MG tablet Take 1 tablet (81 mg total) by mouth daily.  Marland Kitchen bismuth subsalicylate (PEPTO BISMOL) 262 MG/15ML suspension Take 30 mLs by mouth 4 (four) times daily -  before meals and at bedtime. (Patient taking differently: Take 30 mLs by mouth every 4 (four) hours as needed. )  . Calcium Carbonate-Vitamin D (CALCIUM-VITAMIN D) 500-200 MG-UNIT per tablet Take 1 tablet by mouth daily.  . citalopram (CELEXA) 20 MG tablet Take 1 tablet (20 mg total) by mouth daily.  Marland Kitchen dicyclomine (BENTYL) 10 MG capsule Take 1 capsule (10 mg total) by mouth 4 (four) times daily -  before meals and at bedtime.  . flecainide (TAMBOCOR) 50 MG tablet TAKE 1 TABLET BY MOUTH TWICE DAILY  . fluticasone (FLONASE) 50 MCG/ACT nasal spray Place 2 sprays into both nostrils daily.  Marland Kitchen levothyroxine (SYNTHROID, LEVOTHROID) 88 MCG tablet TAKE 1 TABLET BY MOUTH DAILY BEFORE BREAKFAST  . loperamide (IMODIUM) 2 MG capsule Take by mouth as needed for diarrhea or loose stools.  . methotrexate (RHEUMATREX) 2.5 MG tablet Take 4 tablets (10 mg total) by mouth once a week. Caution:Chemotherapy. Protect from light.  . metoprolol (LOPRESSOR) 50 MG tablet Take 1.5 tablets by mouth twice a day  . mirtazapine (REMERON) 15 MG tablet 1 tab po qd  . omeprazole (PRILOSEC)  40 MG capsule Take 1 capsule (40 mg total) by mouth daily.  . potassium chloride SA (K-DUR,KLOR-CON) 20 MEQ tablet Take 2 tablets (40 mEq total) by mouth 2 (two) times daily.  . rivaroxaban (XARELTO) 20 MG TABS tablet Take 1 tablet (20 mg total) by mouth daily.  . simvastatin (ZOCOR) 40 MG tablet Take 1 tablet (40 mg total) by mouth daily.  . [DISCONTINUED] acetaminophen (TYLENOL) 160 MG/5ML solution 650 mg as needed.  . [DISCONTINUED]  Calcium Citrate-Vitamin D (CITRACAL PETITES/VITAMIN D) 200-250 MG-UNIT TABS Take 2 tablets by mouth every morning.  . [DISCONTINUED] flecainide (TAMBOCOR) 50 MG tablet Take 1 tablet (50 mg total) by mouth 2 (two) times daily.  . [DISCONTINUED] lansoprazole (PREVACID) 30 MG capsule Take 1 capsule (30 mg total) by mouth daily at 12 noon.  . [DISCONTINUED] metFORMIN (GLUCOPHAGE-XR) 500 MG 24 hr tablet TAKE 1 TABLET BY MOUTH EVERY DAY WITH BREAKFAST  . [DISCONTINUED] nitrofurantoin, macrocrystal-monohydrate, (MACROBID) 100 MG capsule Take 1 capsule by mouth 2 (two) times daily.  . [DISCONTINUED] nystatin-triamcinolone (MYCOLOG II) cream Apply 1 application topically 2 (two) times daily.  . [DISCONTINUED] predniSONE (DELTASONE) 20 MG tablet Take 20 mg by mouth as needed. gout  . [DISCONTINUED] traMADol (ULTRAM) 50 MG tablet Take 1 tablet (50 mg total) by mouth every 8 (eight) hours as needed for pain.  Marland Kitchen levETIRAcetam (KEPPRA) 500 MG tablet Take 1 tablet (500 mg total) by mouth 2 (two) times daily. (Patient not taking: Reported on 03/05/2014)    Review of Systems  Constitutional: Negative.   HENT: Negative.   Eyes: Negative.   Respiratory: Negative.   Cardiovascular: Negative.   Gastrointestinal: Negative.   Endocrine: Negative.   Genitourinary: Negative.   Musculoskeletal: Positive for back pain (low back pain).       Possible restless leg   Skin: Negative.   Allergic/Immunologic: Negative.   Neurological: Negative.   Hematological: Negative.   Psychiatric/Behavioral: Negative.           Objective:   Physical Exam  Constitutional: She is oriented to person, place, and time. No distress.  She is thin and pale but her appearance is improving significantly.  HENT:  Head: Atraumatic.  Right Ear: External ear normal.  Left Ear: External ear normal.  Nose: Nose normal.  Mouth/Throat: Oropharynx is clear and moist. No oropharyngeal exudate.  Eyes: Conjunctivae and EOM are normal.  Pupils are equal, round, and reactive to light. Right eye exhibits no discharge. Left eye exhibits no discharge. No scleral icterus.  Neck: Normal range of motion. Neck supple. No thyromegaly present.  No carotid bruits or anterior cervical nodes  Cardiovascular: Normal rate, regular rhythm and normal heart sounds.  Exam reveals no gallop and no friction rub.   No murmur heard. The pulses are more difficult to palpate in the right foot. There were very good and the left foot. The heart has a regular rate and rhythm at 60/m  Pulmonary/Chest: Effort normal and breath sounds normal. No respiratory distress. She has no wheezes. She has no rales. She exhibits no tenderness.  Abdominal: Soft. Bowel sounds are normal. She exhibits no mass. There is no tenderness. There is no rebound and no guarding.  Musculoskeletal: She exhibits no edema or tenderness.  The patient has weakness in the right arm and some weakness in the right leg but this seems to be improving in the lower extremity.  Lymphadenopathy:    She has no cervical adenopathy.  Neurological: She is alert and oriented to person,  place, and time. She has normal reflexes. No cranial nerve deficit.  Skin: Skin is warm and dry. No rash noted.  Psychiatric: She has a normal mood and affect. Her behavior is normal. Judgment and thought content normal.  She responds appropriately to questions asked of her and is much more verbal than in the past. The patient's mood is much less depressed.  Nursing note and vitals reviewed.   BP 151/77 mmHg  Pulse 60  Temp(Src) 97 F (36.1 C) (Oral)  Ht $R'5\' 4"'hC$  (1.626 m)  Wt 124 lb (56.246 kg)  BMI 21.27 kg/m2  [facility] reading (PRIMARY) by  Dr.Halim Surrette-LS spine-- some curvature of the spine but no active disease noted                                      Assessment & Plan:  1. Hypothyroidism, unspecified hypothyroidism type See future lab work for any changes in treatment of this problem -For now continue current  treatment - POCT CBC - Thyroid Panel With TSH  2. PAF (paroxysmal atrial fibrillation) -Continue current treatment and recommendations from cardiology - POCT CBC   3. Type 2 diabetes mellitus with diabetic neuropathy -Continue current treatment until lab work is returned and any adjustments will be made at that time - POCT CBC - BMP8+EGFR - POCT glycosylated hemoglobin (Hb A1C)  4. Vitamin D deficiency -See future lab work for any changes in treatment of this problem - POCT CBC - Vit D  25 hydroxy (rtn osteoporosis monitoring)  5. Essential hypertension - POCT CBC - BMP8+EGFR - Hepatic function panel  6. Hyperlipidemia -Continue current treatment to future lab work is returned - POCT CBC - Lipid panel  7. Cerebral infarction due to other mechanism -Continue with planned follow-up by neurology -Continue with plans for physical therapy  Patient Instructions                       Medicare Annual Wellness Visit  [facility] and the medical providers at [facility] strive to bring you the best medical care.  In doing so we not only want to address your current medical conditions and concerns but also to detect new conditions early and prevent illness, disease and health-related problems.    Medicare offers a yearly Wellness Visit which allows our clinical staff to assess your need for preventative services including immunizations, lifestyle education, counseling to decrease risk of preventable diseases and screening for fall risk and other medical concerns.    This visit is provided free of charge (no copay) for all Medicare recipients. The clinical pharmacists at [facility] have begun to conduct these Wellness Visits which will also include a thorough review of all your medications.    As you primary medical provider recommend that you make an appointment for your Annual Wellness Visit if you have not done so already this year.  You  may set up this appointment before you leave today or you may call back ([phone]) and schedule an appointment.  Please make sure when you call that you mention that you are scheduling your Annual Wellness Visit with the clinical pharmacist so that the appointment may be made for the proper length of time.     Continue current medications. Continue good therapeutic lifestyle changes which include good diet and exercise. Fall precautions discussed with patient. If an FOBT was given  today- please return it to our front desk. If you are over 61 years old - you may need Prevnar 4 or the adult Pneumonia vaccine.  Flu Shots will be available at our office starting mid- September. Please call and schedule a FLU CLINIC APPOINTMENT.   Continue with follow-up by cardiology, Dr. Rayann Heman Follow through with appointment with neurologist Follow through with physical therapy Make an appointment with the ophthalmologist to check her vision Monitor blood pressures and blood sugars and take these numbers and bring them to our office and to other doctors offices for them to see what the results are when you make visit with them or with [place] Continue to monitor blood sugars and blood pressures regularly We will call you with the results of the lumbosacral spine films as soon as they are available Bring current medication list to [place] so we can update our records appropriately Please be careful and did not fall keep work [employer] on strengthening your body   Arrie Senate MD

## 2014-03-05 NOTE — Patient Instructions (Addendum)
Medicare Annual Wellness Visit  [facility] and the medical providers at [facility] strive to bring you the best medical care.  In doing so we not only want to address your current medical conditions and concerns but also to detect new conditions early and prevent illness, disease and health-related problems.    Medicare offers a yearly Wellness Visit which allows our clinical staff to assess your need for preventative services including immunizations, lifestyle education, counseling to decrease risk of preventable diseases and screening for fall risk and other medical concerns.    This visit is provided free of charge (no copay) for all Medicare recipients. The clinical pharmacists at [facility] have begun to conduct these Wellness Visits which will also include a thorough review of all your medications.    As you primary medical provider recommend that you make an appointment for your Annual Wellness Visit if you have not done so already this year.  You may set up this appointment before you leave today or you may call back ([phone]) and schedule an appointment.  Please make sure when you call that you mention that you are scheduling your Annual Wellness Visit with the clinical pharmacist so that the appointment may be made for the proper length of time.     Continue current medications. Continue good therapeutic lifestyle changes which include good diet and exercise. Fall precautions discussed with patient. If an FOBT was given today- please return it to our front desk. If you are over 10 years old - you may need Prevnar 63 or the adult Pneumonia vaccine.  Flu Shots will be available at our office starting mid- September. Please call and schedule a FLU CLINIC APPOINTMENT.   Continue with follow-up by cardiology, Dr. Rayann Heman Follow through with appointment with neurologist Follow through with physical therapy Make an  appointment with the ophthalmologist to check her vision Monitor blood pressures and blood sugars and take these numbers and bring them to our office and to other doctors offices for them to see what the results are when you make visit with them or with [place] Continue to monitor blood sugars and blood pressures regularly We will call you with the results of the lumbosacral spine films as soon as they are available Bring current medication list to [place] so we can update our records appropriately Please be careful and did not fall keep work [employer] on [employer]

## 2014-03-06 ENCOUNTER — Telehealth: Payer: Self-pay | Admitting: Family Medicine

## 2014-03-06 LAB — THYROID PANEL WITH TSH
Free Thyroxine Index: 2.4 (ref 1.2–4.9)
T3 Uptake Ratio: 25 % (ref 24–39)
T4, Total: 9.5 ug/dL (ref 4.5–12.0)
TSH: 0.382 u[IU]/mL — ABNORMAL LOW (ref 0.450–4.500)

## 2014-03-06 LAB — BMP8+EGFR
BUN/Creatinine Ratio: 21 (ref 11–26)
BUN: 13 mg/dL (ref 8–27)
CO2: 27 mmol/L (ref 18–29)
Calcium: 9.5 mg/dL (ref 8.7–10.3)
Chloride: 100 mmol/L (ref 97–108)
Creatinine, Ser: 0.61 mg/dL (ref 0.57–1.00)
GFR calc Af Amer: 109 mL/min/{1.73_m2} (ref >59)
GFR calc non Af Amer: 95 mL/min/{1.73_m2} (ref >59)
Glucose: 89 mg/dL (ref 65–99)
Potassium: 4.4 mmol/L (ref 3.5–5.2)
Sodium: 140 mmol/L (ref 134–144)

## 2014-03-06 LAB — HEPATIC FUNCTION PANEL
ALT: 61 IU/L — ABNORMAL HIGH (ref 0–32)
AST: 71 IU/L — ABNORMAL HIGH (ref 0–40)
Albumin: 4.1 g/dL (ref 3.6–4.8)
Alkaline Phosphatase: 61 IU/L (ref 39–117)
Bilirubin, Direct: 0.11 mg/dL (ref 0.00–0.40)
Total Bilirubin: 0.3 mg/dL (ref 0.0–1.2)
Total Protein: 6.4 g/dL (ref 6.0–8.5)

## 2014-03-06 LAB — LIPID PANEL
Chol/HDL Ratio: 3.3 ratio units (ref 0.0–4.4)
Cholesterol, Total: 163 mg/dL (ref 100–199)
HDL: 50 mg/dL (ref >39)
LDL Calculated: 80 mg/dL (ref 0–99)
Triglycerides: 165 mg/dL — ABNORMAL HIGH (ref 0–149)
VLDL Cholesterol Cal: 33 mg/dL (ref 5–40)

## 2014-03-06 LAB — VITAMIN D 25 HYDROXY (VIT D DEFICIENCY, FRACTURES): Vit D, 25-Hydroxy: 39.1 ng/mL (ref 30.0–100.0)

## 2014-03-06 NOTE — Telephone Encounter (Signed)
Stp his wife isn't taking Keppra, husband states he is calling the neurologist. He wanted me to make sure you were aware.

## 2014-03-09 NOTE — Telephone Encounter (Signed)
This is documented on med list

## 2014-03-10 DIAGNOSIS — G4089 Other seizures: Secondary | ICD-10-CM | POA: Diagnosis not present

## 2014-03-17 DIAGNOSIS — H3531 Nonexudative age-related macular degeneration: Secondary | ICD-10-CM | POA: Diagnosis not present

## 2014-03-18 ENCOUNTER — Telehealth: Payer: Self-pay | Admitting: Family Medicine

## 2014-03-19 ENCOUNTER — Telehealth: Payer: Self-pay | Admitting: Family Medicine

## 2014-03-19 NOTE — Telephone Encounter (Signed)
I would not think there would be any drug interaction. Please forward this to the clinical pharmacists and make sure they concur.

## 2014-03-19 NOTE — Telephone Encounter (Signed)
Left detailed message stating the supplement for her mothers eyes would not have any reaction with her other medications, and if there were any further questions or concerns to call us back. Will close encounter.

## 2014-03-19 NOTE — Telephone Encounter (Signed)
No problems with medications recommended by optometrist / opthalmologist and current medications.

## 2014-03-20 NOTE — Telephone Encounter (Signed)
Follow up in 4 months and bring readings of BS and BP readings   Husband aware

## 2014-03-21 ENCOUNTER — Other Ambulatory Visit: Payer: Self-pay | Admitting: Family Medicine

## 2014-03-23 DIAGNOSIS — I639 Cerebral infarction, unspecified: Secondary | ICD-10-CM | POA: Diagnosis not present

## 2014-03-23 DIAGNOSIS — R2689 Other abnormalities of gait and mobility: Secondary | ICD-10-CM | POA: Diagnosis not present

## 2014-03-23 DIAGNOSIS — G40009 Localization-related (focal) (partial) idiopathic epilepsy and epileptic syndromes with seizures of localized onset, not intractable, without status epilepticus: Secondary | ICD-10-CM | POA: Diagnosis not present

## 2014-03-23 DIAGNOSIS — R21 Rash and other nonspecific skin eruption: Secondary | ICD-10-CM | POA: Diagnosis not present

## 2014-03-30 DIAGNOSIS — Z79899 Other long term (current) drug therapy: Secondary | ICD-10-CM | POA: Diagnosis not present

## 2014-03-30 DIAGNOSIS — L853 Xerosis cutis: Secondary | ICD-10-CM | POA: Diagnosis not present

## 2014-03-30 DIAGNOSIS — L409 Psoriasis, unspecified: Secondary | ICD-10-CM | POA: Diagnosis not present

## 2014-04-03 DIAGNOSIS — G40009 Localization-related (focal) (partial) idiopathic epilepsy and epileptic syndromes with seizures of localized onset, not intractable, without status epilepticus: Secondary | ICD-10-CM | POA: Diagnosis not present

## 2014-04-08 ENCOUNTER — Other Ambulatory Visit: Payer: Self-pay | Admitting: Family Medicine

## 2014-04-13 ENCOUNTER — Telehealth: Payer: Self-pay | Admitting: Family Medicine

## 2014-04-13 NOTE — Telephone Encounter (Signed)
I see where we had ordered occupational therapy but then it looks like the referral was cancelled. Was she supposed to have PT? Please advise.

## 2014-04-14 ENCOUNTER — Telehealth: Payer: Self-pay | Admitting: Family Medicine

## 2014-04-14 NOTE — Telephone Encounter (Signed)
Spoke with Florentina Addison _ referrals to re- order and set up pt for all therapies.

## 2014-04-14 NOTE — Telephone Encounter (Signed)
Pt contacted on other call - note

## 2014-04-17 ENCOUNTER — Other Ambulatory Visit: Payer: Self-pay | Admitting: Family Medicine

## 2014-04-20 ENCOUNTER — Telehealth: Payer: Self-pay | Admitting: Family Medicine

## 2014-04-21 ENCOUNTER — Other Ambulatory Visit: Payer: Self-pay | Admitting: *Deleted

## 2014-04-21 MED ORDER — METHOTREXATE 2.5 MG PO TABS: ORAL_TABLET | ORAL | Status: DC

## 2014-04-21 NOTE — Telephone Encounter (Signed)
rx sent over

## 2014-04-21 NOTE — Telephone Encounter (Signed)
rx sent over per Dr.Moores note on telephone call.

## 2014-04-21 NOTE — Telephone Encounter (Signed)
Please refill the methotrexate for this patient.

## 2014-04-27 DIAGNOSIS — R2689 Other abnormalities of gait and mobility: Secondary | ICD-10-CM | POA: Diagnosis not present

## 2014-04-27 DIAGNOSIS — I69828 Other speech and language deficits following other cerebrovascular disease: Secondary | ICD-10-CM | POA: Diagnosis not present

## 2014-04-27 DIAGNOSIS — I69891 Dysphagia following other cerebrovascular disease: Secondary | ICD-10-CM | POA: Diagnosis not present

## 2014-04-27 DIAGNOSIS — M79621 Pain in right upper arm: Secondary | ICD-10-CM | POA: Diagnosis not present

## 2014-04-27 DIAGNOSIS — R262 Difficulty in walking, not elsewhere classified: Secondary | ICD-10-CM | POA: Diagnosis not present

## 2014-04-27 DIAGNOSIS — M6281 Muscle weakness (generalized): Secondary | ICD-10-CM | POA: Diagnosis not present

## 2014-04-28 DIAGNOSIS — R262 Difficulty in walking, not elsewhere classified: Secondary | ICD-10-CM | POA: Diagnosis not present

## 2014-04-28 DIAGNOSIS — I69828 Other speech and language deficits following other cerebrovascular disease: Secondary | ICD-10-CM | POA: Diagnosis not present

## 2014-04-28 DIAGNOSIS — M79621 Pain in right upper arm: Secondary | ICD-10-CM | POA: Diagnosis not present

## 2014-04-28 DIAGNOSIS — I69891 Dysphagia following other cerebrovascular disease: Secondary | ICD-10-CM | POA: Diagnosis not present

## 2014-04-28 DIAGNOSIS — R2689 Other abnormalities of gait and mobility: Secondary | ICD-10-CM | POA: Diagnosis not present

## 2014-04-28 DIAGNOSIS — M6281 Muscle weakness (generalized): Secondary | ICD-10-CM | POA: Diagnosis not present

## 2014-04-29 DIAGNOSIS — M79621 Pain in right upper arm: Secondary | ICD-10-CM | POA: Diagnosis not present

## 2014-04-29 DIAGNOSIS — R2689 Other abnormalities of gait and mobility: Secondary | ICD-10-CM | POA: Diagnosis not present

## 2014-04-29 DIAGNOSIS — I69891 Dysphagia following other cerebrovascular disease: Secondary | ICD-10-CM | POA: Diagnosis not present

## 2014-04-29 DIAGNOSIS — M6281 Muscle weakness (generalized): Secondary | ICD-10-CM | POA: Diagnosis not present

## 2014-04-29 DIAGNOSIS — R262 Difficulty in walking, not elsewhere classified: Secondary | ICD-10-CM | POA: Diagnosis not present

## 2014-04-29 DIAGNOSIS — I69828 Other speech and language deficits following other cerebrovascular disease: Secondary | ICD-10-CM | POA: Diagnosis not present

## 2014-05-01 ENCOUNTER — Ambulatory Visit (INDEPENDENT_AMBULATORY_CARE_PROVIDER_SITE_OTHER): Payer: Medicare Other | Admitting: Urology

## 2014-05-01 DIAGNOSIS — N3941 Urge incontinence: Secondary | ICD-10-CM

## 2014-05-01 DIAGNOSIS — N3281 Overactive bladder: Secondary | ICD-10-CM

## 2014-05-01 DIAGNOSIS — Z87440 Personal history of urinary (tract) infections: Secondary | ICD-10-CM

## 2014-05-05 DIAGNOSIS — M6281 Muscle weakness (generalized): Secondary | ICD-10-CM | POA: Diagnosis not present

## 2014-05-05 DIAGNOSIS — R2689 Other abnormalities of gait and mobility: Secondary | ICD-10-CM | POA: Diagnosis not present

## 2014-05-05 DIAGNOSIS — I69828 Other speech and language deficits following other cerebrovascular disease: Secondary | ICD-10-CM | POA: Diagnosis not present

## 2014-05-05 DIAGNOSIS — M79621 Pain in right upper arm: Secondary | ICD-10-CM | POA: Diagnosis not present

## 2014-05-05 DIAGNOSIS — I69891 Dysphagia following other cerebrovascular disease: Secondary | ICD-10-CM | POA: Diagnosis not present

## 2014-05-05 DIAGNOSIS — R262 Difficulty in walking, not elsewhere classified: Secondary | ICD-10-CM | POA: Diagnosis not present

## 2014-05-07 DIAGNOSIS — M6281 Muscle weakness (generalized): Secondary | ICD-10-CM | POA: Diagnosis not present

## 2014-05-07 DIAGNOSIS — M79621 Pain in right upper arm: Secondary | ICD-10-CM | POA: Diagnosis not present

## 2014-05-07 DIAGNOSIS — I69828 Other speech and language deficits following other cerebrovascular disease: Secondary | ICD-10-CM | POA: Diagnosis not present

## 2014-05-07 DIAGNOSIS — R2689 Other abnormalities of gait and mobility: Secondary | ICD-10-CM | POA: Diagnosis not present

## 2014-05-07 DIAGNOSIS — I69891 Dysphagia following other cerebrovascular disease: Secondary | ICD-10-CM | POA: Diagnosis not present

## 2014-05-07 DIAGNOSIS — R262 Difficulty in walking, not elsewhere classified: Secondary | ICD-10-CM | POA: Diagnosis not present

## 2014-05-12 DIAGNOSIS — I69891 Dysphagia following other cerebrovascular disease: Secondary | ICD-10-CM | POA: Diagnosis not present

## 2014-05-12 DIAGNOSIS — M6281 Muscle weakness (generalized): Secondary | ICD-10-CM | POA: Diagnosis not present

## 2014-05-12 DIAGNOSIS — M79621 Pain in right upper arm: Secondary | ICD-10-CM | POA: Diagnosis not present

## 2014-05-12 DIAGNOSIS — R2689 Other abnormalities of gait and mobility: Secondary | ICD-10-CM | POA: Diagnosis not present

## 2014-05-12 DIAGNOSIS — I69828 Other speech and language deficits following other cerebrovascular disease: Secondary | ICD-10-CM | POA: Diagnosis not present

## 2014-05-12 DIAGNOSIS — R262 Difficulty in walking, not elsewhere classified: Secondary | ICD-10-CM | POA: Diagnosis not present

## 2014-05-14 DIAGNOSIS — R262 Difficulty in walking, not elsewhere classified: Secondary | ICD-10-CM | POA: Diagnosis not present

## 2014-05-14 DIAGNOSIS — I69828 Other speech and language deficits following other cerebrovascular disease: Secondary | ICD-10-CM | POA: Diagnosis not present

## 2014-05-14 DIAGNOSIS — R2689 Other abnormalities of gait and mobility: Secondary | ICD-10-CM | POA: Diagnosis not present

## 2014-05-14 DIAGNOSIS — M6281 Muscle weakness (generalized): Secondary | ICD-10-CM | POA: Diagnosis not present

## 2014-05-14 DIAGNOSIS — I69891 Dysphagia following other cerebrovascular disease: Secondary | ICD-10-CM | POA: Diagnosis not present

## 2014-05-14 DIAGNOSIS — M79621 Pain in right upper arm: Secondary | ICD-10-CM | POA: Diagnosis not present

## 2014-05-17 ENCOUNTER — Other Ambulatory Visit: Payer: Self-pay | Admitting: Family Medicine

## 2014-05-18 ENCOUNTER — Other Ambulatory Visit: Payer: Self-pay | Admitting: Family Medicine

## 2014-05-19 DIAGNOSIS — I69828 Other speech and language deficits following other cerebrovascular disease: Secondary | ICD-10-CM | POA: Diagnosis not present

## 2014-05-19 DIAGNOSIS — I69891 Dysphagia following other cerebrovascular disease: Secondary | ICD-10-CM | POA: Diagnosis not present

## 2014-05-19 DIAGNOSIS — M79621 Pain in right upper arm: Secondary | ICD-10-CM | POA: Diagnosis not present

## 2014-05-19 DIAGNOSIS — R2689 Other abnormalities of gait and mobility: Secondary | ICD-10-CM | POA: Diagnosis not present

## 2014-05-19 DIAGNOSIS — M6281 Muscle weakness (generalized): Secondary | ICD-10-CM | POA: Diagnosis not present

## 2014-05-19 DIAGNOSIS — R262 Difficulty in walking, not elsewhere classified: Secondary | ICD-10-CM | POA: Diagnosis not present

## 2014-05-21 ENCOUNTER — Ambulatory Visit (INDEPENDENT_AMBULATORY_CARE_PROVIDER_SITE_OTHER): Payer: Medicare Other | Admitting: Family Medicine

## 2014-05-21 DIAGNOSIS — I69828 Other speech and language deficits following other cerebrovascular disease: Secondary | ICD-10-CM | POA: Diagnosis not present

## 2014-05-21 DIAGNOSIS — I69891 Dysphagia following other cerebrovascular disease: Secondary | ICD-10-CM | POA: Diagnosis not present

## 2014-05-21 DIAGNOSIS — I6349 Cerebral infarction due to embolism of other cerebral artery: Secondary | ICD-10-CM

## 2014-05-21 DIAGNOSIS — R262 Difficulty in walking, not elsewhere classified: Secondary | ICD-10-CM | POA: Diagnosis not present

## 2014-05-21 DIAGNOSIS — R2689 Other abnormalities of gait and mobility: Secondary | ICD-10-CM | POA: Diagnosis not present

## 2014-05-21 DIAGNOSIS — M6281 Muscle weakness (generalized): Secondary | ICD-10-CM | POA: Diagnosis not present

## 2014-05-21 DIAGNOSIS — M79621 Pain in right upper arm: Secondary | ICD-10-CM | POA: Diagnosis not present

## 2014-05-25 DIAGNOSIS — G40109 Localization-related (focal) (partial) symptomatic epilepsy and epileptic syndromes with simple partial seizures, not intractable, without status epilepticus: Secondary | ICD-10-CM | POA: Diagnosis not present

## 2014-05-25 DIAGNOSIS — I639 Cerebral infarction, unspecified: Secondary | ICD-10-CM | POA: Diagnosis not present

## 2014-05-25 DIAGNOSIS — R2689 Other abnormalities of gait and mobility: Secondary | ICD-10-CM | POA: Diagnosis not present

## 2014-05-25 DIAGNOSIS — I482 Chronic atrial fibrillation: Secondary | ICD-10-CM | POA: Diagnosis not present

## 2014-05-25 DIAGNOSIS — E1149 Type 2 diabetes mellitus with other diabetic neurological complication: Secondary | ICD-10-CM | POA: Diagnosis not present

## 2014-05-31 ENCOUNTER — Other Ambulatory Visit: Payer: Self-pay | Admitting: Family Medicine

## 2014-06-01 DIAGNOSIS — M6281 Muscle weakness (generalized): Secondary | ICD-10-CM | POA: Diagnosis not present

## 2014-06-01 DIAGNOSIS — R262 Difficulty in walking, not elsewhere classified: Secondary | ICD-10-CM | POA: Diagnosis not present

## 2014-06-01 DIAGNOSIS — R2689 Other abnormalities of gait and mobility: Secondary | ICD-10-CM | POA: Diagnosis not present

## 2014-06-03 DIAGNOSIS — M6281 Muscle weakness (generalized): Secondary | ICD-10-CM | POA: Diagnosis not present

## 2014-06-03 DIAGNOSIS — R2689 Other abnormalities of gait and mobility: Secondary | ICD-10-CM | POA: Diagnosis not present

## 2014-06-03 DIAGNOSIS — R262 Difficulty in walking, not elsewhere classified: Secondary | ICD-10-CM | POA: Diagnosis not present

## 2014-06-08 ENCOUNTER — Other Ambulatory Visit: Payer: Self-pay | Admitting: Family Medicine

## 2014-06-08 DIAGNOSIS — M6281 Muscle weakness (generalized): Secondary | ICD-10-CM | POA: Diagnosis not present

## 2014-06-08 DIAGNOSIS — R2689 Other abnormalities of gait and mobility: Secondary | ICD-10-CM | POA: Diagnosis not present

## 2014-06-08 DIAGNOSIS — R262 Difficulty in walking, not elsewhere classified: Secondary | ICD-10-CM | POA: Diagnosis not present

## 2014-06-10 ENCOUNTER — Other Ambulatory Visit: Payer: Self-pay

## 2014-06-10 ENCOUNTER — Ambulatory Visit (INDEPENDENT_AMBULATORY_CARE_PROVIDER_SITE_OTHER): Payer: Medicare Other | Admitting: Internal Medicine

## 2014-06-10 ENCOUNTER — Telehealth: Payer: Self-pay | Admitting: Family Medicine

## 2014-06-10 ENCOUNTER — Encounter: Payer: Self-pay | Admitting: Internal Medicine

## 2014-06-10 ENCOUNTER — Ambulatory Visit: Payer: Medicare Other | Admitting: Family

## 2014-06-10 VITALS — BP 112/82 | HR 61 | Ht 64.0 in | Wt 144.4 lb

## 2014-06-10 DIAGNOSIS — I495 Sick sinus syndrome: Secondary | ICD-10-CM

## 2014-06-10 DIAGNOSIS — I480 Paroxysmal atrial fibrillation: Secondary | ICD-10-CM | POA: Diagnosis not present

## 2014-06-10 DIAGNOSIS — I639 Cerebral infarction, unspecified: Secondary | ICD-10-CM | POA: Diagnosis not present

## 2014-06-10 LAB — MDC_IDC_ENUM_SESS_TYPE_INCLINIC
Battery Remaining Longevity: 100.8 mo
Battery Voltage: 2.93 V
Brady Statistic RA Percent Paced: 88 %
Brady Statistic RV Percent Paced: 11 %
Date Time Interrogation Session: 20160420171031
Implantable Pulse Generator Model: 2110
Implantable Pulse Generator Serial Number: 2285953
Lead Channel Impedance Value: 412.5 Ohm
Lead Channel Impedance Value: 437.5 Ohm
Lead Channel Pacing Threshold Amplitude: 1 V
Lead Channel Pacing Threshold Amplitude: 1.5 V
Lead Channel Pacing Threshold Pulse Width: 0.4 ms
Lead Channel Pacing Threshold Pulse Width: 0.8 ms
Lead Channel Sensing Intrinsic Amplitude: 4.3 mV
Lead Channel Sensing Intrinsic Amplitude: 4.5 mV
Lead Channel Setting Pacing Amplitude: 2 V
Lead Channel Setting Pacing Amplitude: 2.5 V
Lead Channel Setting Pacing Pulse Width: 0.8 ms
Lead Channel Setting Sensing Sensitivity: 1.5 mV

## 2014-06-10 MED ORDER — METOPROLOL TARTRATE 50 MG PO TABS: 50.0000 mg | ORAL_TABLET | Freq: Two times a day (BID) | ORAL | Status: DC

## 2014-06-10 NOTE — Telephone Encounter (Signed)
Appointment given for 6:10pm tonight with [place].

## 2014-06-10 NOTE — Progress Notes (Signed)
PCP: Redge Gainer, MD Primary Cardiologist:  Dr Criss Rosales is a 69 y.o. female who presents today for routine electrophysiology followup.  She has had a large stroke as complication from her convergent afib ablation procedure at [facility] 2/15.  She continues to make very slow recovery.  She is now walking with assistance of a walker.  She is maintaining sinus rhythm. Today, she denies symptoms of chest pain, shortness of breath, dizziness, presyncope, or syncope.    The patient is otherwise without complaint today.   Past Medical History  Diagnosis Date  . Impaired glucose tolerance   . Psoriasis   . IBS (irritable bowel syndrome)     vs diarrhea vs abd. fullness   . Chronic anxiety   . Uterine prolapse   . Bronchial spasms   . Hypertension   . Esophageal stricture   . Elevated blood sugar   . Diverticulosis   . Rectocele, female   . History of bladder repair surgery   . Hx of vaginal hysterectomy   . Vaginal prolapse 1998  . Hypothyroidism   . Psoriasis   . Tachy-brady syndrome     a. 08/19/2008 s/p PPM: SJM 2110 Accent  . Complication of anesthesia     " I shake real bad "  . Family history of anesthesia complication     Daughter also shakes while waking up  . GERD (gastroesophageal reflux disease)   . H/O hiatal hernia   . Neuromuscular disorder     periferal neuropathy  . Anemia   . Paroxysmal atrial fibrillation     a. failed flecainide, tikosyn, amio;  b. 01/2012 s/p RFCA.  . H/O scarlet fever   . Pacemaker     St. Jude   Past Surgical History  Procedure Laterality Date  . Vaginal hysterectomy      prolapse   . Bladder suspension    . Rectocele repair    . Transthoracic echocardiogram  2008  . Tee without cardioversion  02/06/2012    Procedure: TRANSESOPHAGEAL ECHOCARDIOGRAM (TEE);  Surgeon: Thayer Headings, MD;  Location[facility] ENDOSCOPY;  Service: Cardiovascular;  Laterality: N/A;  . Atrial fibrillation ablation  02/06/12    PVI by Dr Rayann Heman  . Insert /  replace / remove pacemaker      SJM  . Atrial fibrillation ablation  07/04/2012    repeat PVI by Dr Rayann Heman  . Tee without cardioversion N/A 07/04/2012    Procedure: TRANSESOPHAGEAL ECHOCARDIOGRAM (TEE);  Surgeon: Lelon Perla, MD;  Location[facility] ENDOSCOPY;  Service: Cardiovascular;  Laterality: N/A;  . Atrial fibrillation ablation N/A 02/06/2012    Procedure: ATRIAL FIBRILLATION ABLATION;  Surgeon: Thompson Grayer, MD;  Location: [facility] CATH LAB;  Service: Cardiovascular;  Laterality: N/A;  . Atrial fibrillation ablation N/A 07/04/2012    Procedure: ATRIAL FIBRILLATION ABLATION;  Surgeon: Thompson Grayer, MD;  Location: [facility] CATH LAB;  Service: Cardiovascular;  Laterality: N/A;  . Left heart catheterization with coronary angiogram N/A 03/24/2013    Procedure: LEFT HEART CATHETERIZATION WITH CORONARY ANGIOGRAM;  Surgeon: Blane Ohara, MD;  Location: [facility] CATH LAB;  Service: Cardiovascular;  Laterality: N/A;   ROS- all systems reviewed- see ROS sheet  Current Outpatient Prescriptions  Medication Sig Dispense Refill  . acetaminophen (TYLENOL) 500 MG tablet Take 500 mg by mouth every 6 (six) hours as needed.    . bismuth subsalicylate (PEPTO BISMOL) 262 MG/15ML suspension Take 30 mLs by mouth every 4 (four) hours as needed for indigestion.    Marland Kitchen  Calcium Carbonate-Vitamin D (CALCIUM-VITAMIN D) 500-200 MG-UNIT per tablet Take 1 tablet by mouth daily.    Marland Kitchen dicyclomine (BENTYL) 10 MG capsule Take 1 capsule (10 mg total) by mouth 4 (four) times daily -  before meals and at bedtime. 120 capsule 2  . fluticasone (FLONASE) 50 MCG/ACT nasal spray Place 2 sprays into both nostrils daily. 16 g 2  . levETIRAcetam (KEPPRA) 500 MG tablet Take 1 tablet (500 mg total) by mouth 2 (two) times daily. 60 tablet 3  . levothyroxine (SYNTHROID, LEVOTHROID) 88 MCG tablet TAKE 1 TABLET BY MOUTH DAILY BEFORE BREAKFAST 30 tablet 5  . loperamide (IMODIUM) 2 MG capsule Take by mouth as needed for diarrhea or loose stools.    .  methotrexate (RHEUMATREX) 2.5 MG tablet Take 4 tablets (10 mg total) by mouth once a week. Caution:Chemotherapy. Protect from light. 16 tablet 1  . metoprolol (LOPRESSOR) 50 MG tablet Take 1 tablet (50 mg total) by mouth 2 (two) times daily. 90 tablet 2  . mirtazapine (REMERON) 15 MG tablet TAKE 1 TABLET BY MOUTH EVERY DAY 90 tablet 0  . omeprazole (PRILOSEC) 40 MG capsule Take 1 capsule (40 mg total) by mouth daily. 30 capsule 5  . ONE TOUCH ULTRA TEST test strip TEST ONCE DAILY AND AS NEEDED 100 each 5  . oxybutynin (DITROPAN) 5 MG tablet Take 5 mg by mouth at bedtime.    . potassium chloride SA (K-DUR,KLOR-CON) 20 MEQ tablet TAKE 2 TABLETS BY MOUTH TWICE DAILY 120 tablet 2  . simvastatin (ZOCOR) 40 MG tablet TAKE 1 TABLET BY MOUTH EVERY DAY 30 tablet 2  . XARELTO 20 MG TABS tablet TAKE 1 TABLET BY MOUTH EVERY DAY 30 tablet 1  . citalopram (CELEXA) 20 MG tablet Take 1 tablet (20 mg total) by mouth daily. 30 tablet 2   No current facility-administered medications for this visit.    Physical Exam: Filed Vitals:   06/10/14 1645  BP: 112/82  Pulse: 61  Height: 5\' 4"  (1.626 m)  Weight: 144 lb 6.4 oz (65.499 kg)    GEN- The patient is chronically ill appearing, alert, significant expressive aphasia/ dysarthria persists   Head- normocephalic, atraumatic Eyes-  Sclera clear, conjunctiva pink Ears- hearing intact Oropharynx- clear Lungs- Clear to ausculation bilaterally, normal work of breathing Chest- pacemaker pocket is well healed Heart- Regular rate and rhythm, no murmurs, rubs or gallops, PMI not laterally displaced GI- soft, NT, ND, + BS Extremities- no clubbing, cyanosis, or edema Neuro- expressive aphasia, R sided weakness is significant  Pacemaker interrogation- reviewed in detail today,  See PACEART report ekg today reveals atrial pacing, PR 226, nonspecific ST/T changes  Assessment and Plan:  1. Paroxysmal atrial fibrillation Maintaining sinus rhythm Stop ASA Continue  life long anticoagulation with xarelto Stop flecainide Reduce metoprolol to 50mg  BID  2. Sick sinus syndrome/ symptomatic bradycardia Normal pacemaker function See Pace Art report No changes today  3. Advanced deficit s/p stroke She continues to make slow progress She is followed by neurology in [place] but is unhappy with her care and would like to be seen by neurology in [facility] return to see me in 6 months

## 2014-06-10 NOTE — Patient Instructions (Signed)
Medication Instructions:  Your physician has recommended you make the following change in your medication:  1) Stop Aspirin 2) Stop Flecainide  3) Decrease Metoprolol to 50mg  twice daily    Labwork: None ordered  Testing/Procedures: You have been referred to Dr Loman [facility]   Follow-Up: Your physician wants you to follow-up in: 6 months with Dr Rayann Heman Dennis Bast will receive a reminder letter in the mail two months in advance. If you don't receive a letter, please call our office to schedule the follow-up appointment.  Remote monitoring is used to monitor your Pacemaker or ICD from home. This monitoring reduces the number of office visits required to check your device to one time per year. It allows [place] to keep an eye on the functioning of your device to ensure it is working properly. You are scheduled for a device check from home on 09/09/14. You may send your transmission at any time that day. If you have a wireless device, the transmission will be sent automatically. After your physician reviews your transmission, you will receive a postcard with your next transmission date.     Any Other Special Instructions Will Be Listed Below (If Applicable).

## 2014-06-11 ENCOUNTER — Ambulatory Visit (INDEPENDENT_AMBULATORY_CARE_PROVIDER_SITE_OTHER): Payer: Medicare Other | Admitting: Nurse Practitioner

## 2014-06-11 ENCOUNTER — Encounter: Payer: Self-pay | Admitting: Nurse Practitioner

## 2014-06-11 VITALS — BP 132/72 | HR 60 | Temp 96.9°F | Ht 64.0 in | Wt 142.0 lb

## 2014-06-11 DIAGNOSIS — I639 Cerebral infarction, unspecified: Secondary | ICD-10-CM

## 2014-06-11 DIAGNOSIS — R300 Dysuria: Secondary | ICD-10-CM | POA: Diagnosis not present

## 2014-06-11 LAB — POCT UA - MICROSCOPIC ONLY
Casts, Ur, LPF, POC: NEGATIVE
Crystals, Ur, HPF, POC: NEGATIVE
Mucus, UA: NEGATIVE
RBC, urine, microscopic: NEGATIVE

## 2014-06-11 LAB — POCT URINALYSIS DIPSTICK
Bilirubin, UA: NEGATIVE
Blood, UA: NEGATIVE
Glucose, UA: NEGATIVE
Ketones, UA: NEGATIVE
Nitrite, UA: POSITIVE
Protein, UA: NEGATIVE
Spec Grav, UA: 1.015
Urobilinogen, UA: NEGATIVE
pH, UA: 6.5

## 2014-06-11 MED ORDER — NITROFURANTOIN MONOHYD MACRO 100 MG PO CAPS: 100.0000 mg | ORAL_CAPSULE | Freq: Two times a day (BID) | ORAL | Status: DC

## 2014-06-11 NOTE — Progress Notes (Signed)
  Subjective:    [name] is a 69 y.o. female who complains of abnormal smelling urine, burning with urination, dysuria, frequency and hesitancy. She has had symptoms for 2 days. Patient also complains of no other problems. Patient denies no other. Patient does have a history of recurrent UTI. Patient does not have a history of pyelonephritis.   The following portions of the patient's history were reviewed and updated as appropriate: allergies, current medications, past family history, past medical history, past social history, past surgical history and problem list.  Review of Systems Pertinent items are noted in HPI.    Objective:    BP 132/72 mmHg  Pulse 60  Temp(Src) 96.9 F (36.1 C) (Oral)  Ht 5\' 4"  (1.626 m)  Wt 142 lb (64.411 kg)  BMI 24.36 kg/m2 Back: symmetric, no curvature. ROM normal. No CVA tenderness. Lungs: clear to auscultation bilaterally Heart: regular rate and rhythm, S1, S2 normal, no murmur, click, rub or gallop Abdomen: soft, non-tender; bowel sounds normal; no masses,  no organomegaly mild supra pubic pain on palpation.  Laboratory:  Results for orders placed or performed in visit on 06/11/14  POCT UA - Microscopic Only  Result Value Ref Range   WBC, Ur, HPF, POC 30-40    RBC, urine, microscopic neg    Bacteria, U Microscopic mod    Mucus, UA neg    Epithelial cells, urine per micros occ    Crystals, Ur, HPF, POC neg    Casts, Ur, LPF, POC neg    Yeast, UA large   POCT urinalysis dipstick  Result Value Ref Range   Color, UA yellow    Clarity, UA cloudy    Glucose, UA neg    Bilirubin, UA neg    Ketones, UA neg    Spec Grav, UA 1.015    Blood, UA neg    pH, UA 6.5    Protein, UA neg    Urobilinogen, UA negative    Nitrite, UA pos    Leukocytes, UA large (3+)      Assessment:    Acute cystitis and UTI     Plan:    Medications: nitrofurantoin. Maintain adequate hydration. Follow up if symptoms not improving, and as needed.   Meds  ordered this encounter  Medications  . nitrofurantoin, macrocrystal-monohydrate, (MACROBID) 100 MG capsule    Sig: Take 1 capsule (100 mg total) by mouth 2 (two) times daily. 1 po BId    Dispense:  14 capsule    Refill:  0    Order Specific Question:  Supervising Provider    Answer:  Chipper Herb [[id number]]   Mary-Margaret Hassell Done, FNP

## 2014-06-11 NOTE — Patient Instructions (Signed)

## 2014-06-13 LAB — URINE CULTURE

## 2014-06-15 DIAGNOSIS — R2689 Other abnormalities of gait and mobility: Secondary | ICD-10-CM | POA: Diagnosis not present

## 2014-06-15 DIAGNOSIS — R262 Difficulty in walking, not elsewhere classified: Secondary | ICD-10-CM | POA: Diagnosis not present

## 2014-06-15 DIAGNOSIS — M6281 Muscle weakness (generalized): Secondary | ICD-10-CM | POA: Diagnosis not present

## 2014-06-17 ENCOUNTER — Other Ambulatory Visit: Payer: Self-pay | Admitting: Family Medicine

## 2014-06-17 DIAGNOSIS — R2689 Other abnormalities of gait and mobility: Secondary | ICD-10-CM | POA: Diagnosis not present

## 2014-06-17 DIAGNOSIS — R262 Difficulty in walking, not elsewhere classified: Secondary | ICD-10-CM | POA: Diagnosis not present

## 2014-06-17 DIAGNOSIS — M6281 Muscle weakness (generalized): Secondary | ICD-10-CM | POA: Diagnosis not present

## 2014-06-22 DIAGNOSIS — R2689 Other abnormalities of gait and mobility: Secondary | ICD-10-CM | POA: Diagnosis not present

## 2014-06-22 DIAGNOSIS — M6281 Muscle weakness (generalized): Secondary | ICD-10-CM | POA: Diagnosis not present

## 2014-06-22 DIAGNOSIS — R262 Difficulty in walking, not elsewhere classified: Secondary | ICD-10-CM | POA: Diagnosis not present

## 2014-06-24 ENCOUNTER — Other Ambulatory Visit: Payer: Self-pay

## 2014-06-24 ENCOUNTER — Other Ambulatory Visit: Payer: Self-pay | Admitting: Family Medicine

## 2014-06-24 DIAGNOSIS — R2689 Other abnormalities of gait and mobility: Secondary | ICD-10-CM | POA: Diagnosis not present

## 2014-06-24 DIAGNOSIS — M6281 Muscle weakness (generalized): Secondary | ICD-10-CM | POA: Diagnosis not present

## 2014-06-24 DIAGNOSIS — R262 Difficulty in walking, not elsewhere classified: Secondary | ICD-10-CM | POA: Diagnosis not present

## 2014-06-24 MED ORDER — CITALOPRAM HYDROBROMIDE 20 MG PO TABS: 20.0000 mg | ORAL_TABLET | Freq: Every day | ORAL | Status: DC

## 2014-07-01 DIAGNOSIS — R262 Difficulty in walking, not elsewhere classified: Secondary | ICD-10-CM | POA: Diagnosis not present

## 2014-07-01 DIAGNOSIS — R2689 Other abnormalities of gait and mobility: Secondary | ICD-10-CM | POA: Diagnosis not present

## 2014-07-01 DIAGNOSIS — M6281 Muscle weakness (generalized): Secondary | ICD-10-CM | POA: Diagnosis not present

## 2014-07-02 ENCOUNTER — Other Ambulatory Visit: Payer: Self-pay | Admitting: Internal Medicine

## 2014-07-06 DIAGNOSIS — R262 Difficulty in walking, not elsewhere classified: Secondary | ICD-10-CM | POA: Diagnosis not present

## 2014-07-06 DIAGNOSIS — M6281 Muscle weakness (generalized): Secondary | ICD-10-CM | POA: Diagnosis not present

## 2014-07-06 DIAGNOSIS — R2689 Other abnormalities of gait and mobility: Secondary | ICD-10-CM | POA: Diagnosis not present

## 2014-07-07 ENCOUNTER — Ambulatory Visit: Payer: Medicare Other | Admitting: Family Medicine

## 2014-07-07 ENCOUNTER — Telehealth: Payer: Self-pay | Admitting: Family Medicine

## 2014-07-07 NOTE — Telephone Encounter (Signed)
Spoke with pt's husband regarding DW[facility]'s recommendation Verbalizes understanding

## 2014-07-07 NOTE — Telephone Encounter (Signed)
Spoke with pt's husband regarding meds Pt was seen by cardiologist Dr Rayann Heman last week and he d/ced Flecanide, ASA and reduced Metoprolol to 50mg  BID Pt's BP readings for the past 2 days have been 154/89 and 156/91 Pt's husband wanted to be sure you were aware of this

## 2014-07-07 NOTE — Telephone Encounter (Signed)
If the elevated readings continue over the next couple of weeks, please have her husband call the cardiologist and let him be aware of this

## 2014-07-08 DIAGNOSIS — R262 Difficulty in walking, not elsewhere classified: Secondary | ICD-10-CM | POA: Diagnosis not present

## 2014-07-08 DIAGNOSIS — M6281 Muscle weakness (generalized): Secondary | ICD-10-CM | POA: Diagnosis not present

## 2014-07-08 DIAGNOSIS — R2689 Other abnormalities of gait and mobility: Secondary | ICD-10-CM | POA: Diagnosis not present

## 2014-07-09 DIAGNOSIS — R2689 Other abnormalities of gait and mobility: Secondary | ICD-10-CM | POA: Diagnosis not present

## 2014-07-09 DIAGNOSIS — M6281 Muscle weakness (generalized): Secondary | ICD-10-CM | POA: Diagnosis not present

## 2014-07-09 DIAGNOSIS — R262 Difficulty in walking, not elsewhere classified: Secondary | ICD-10-CM | POA: Diagnosis not present

## 2014-07-13 ENCOUNTER — Ambulatory Visit: Payer: Medicare Other | Admitting: Family Medicine

## 2014-07-15 ENCOUNTER — Ambulatory Visit (INDEPENDENT_AMBULATORY_CARE_PROVIDER_SITE_OTHER): Payer: Medicare Other | Admitting: Neurology

## 2014-07-15 ENCOUNTER — Encounter: Payer: Self-pay | Admitting: Neurology

## 2014-07-15 VITALS — BP 108/71 | HR 64 | Ht 64.0 in

## 2014-07-15 DIAGNOSIS — F32A Depression, unspecified: Secondary | ICD-10-CM

## 2014-07-15 DIAGNOSIS — I6340 Cerebral infarction due to embolism of unspecified cerebral artery: Secondary | ICD-10-CM | POA: Diagnosis not present

## 2014-07-15 DIAGNOSIS — I639 Cerebral infarction, unspecified: Secondary | ICD-10-CM | POA: Diagnosis not present

## 2014-07-15 DIAGNOSIS — F329 Major depressive disorder, single episode, unspecified: Secondary | ICD-10-CM | POA: Diagnosis not present

## 2014-07-15 NOTE — Progress Notes (Addendum)
[facility]    Provider:  Dr Jaynee Eagles Referring Provider: Chevis Pretty, * Primary Care Physician:  Redge Gainer, MD  CC:  Stroke  HPI:  [name] is a 69 y.o. female here as a referral from Dr. Hassell Done for stroke. She has a past medical history of hypertension, diabetes, high cholesterol, atrial fibrillation, depression, anxiety, hypothyroidism, peripheral neuropathy. She sees Dr. Rayann Heman as her cardiologist and family speaks very highly of him, for paroxysmal A. Fib.. She is here with her husband and son who provide most of the information. Husband is very concerned as he feels patient's memory is affected, her mood is poor, he is unsure why she was treated for seizures in the past and whether she even has seizures. Stroke was in February 20 15th. Patient suffered a stroke during ablation for atrial fibrillation. She was taken to [facility] and was admitted for stroke. She was in rehab at [facility] after the stroke. Then she went to [facility] for rehabilitation and was diagnosed with seizures. She was seeing a neurologist in [place] and had multiple EEGs and husband is unsure if she was being treated appropriately.  She has residual right-sided weakness and aphasia. Per husband and son, she has physically significantly improved since her stroke. She is in physical therapy, occupational therapy as well as speech therapy.   However the biggest concern is her mood. They feel she has depression. When patient is asked if she is depressed she nods yes. Some weeks are better than others but she has lost interest in doing activities that she liked, she appears very fatigued, disinterest in activities, sad most days. She gets frustrated with phones, she won't use a computer, she gets flustered. Her mood is variable, sometimes she is happier than other times. Motivation fluctuates. She doesn't want to go out of the house. Son's wife  spends time with patient and is worried about patient's outlook.. Family Questions on whether or not this is associated with stroke. They're very concerned and would like to help patient. She has lost weight and has decreased appetite.    Review of Systems: Patient complains of symptoms per HPI as well as the following symptoms: Fatigue, easy bruising, easy bleeding, shortness of breath, swelling in legs, snoring, feeling hot, feeling cold, spinning sensation, trouble swallowing, incontinence, constipation, joint pain, joint swelling, rash, itching, moles, urination problems, incontinence, allergies, runny nose, memory loss, numbness, weakness, difficulty swallowing, dizziness, seizure, depression, anxiety, decreased energy, disinterest in activities, change in appetite, insomnia, snoring, restless legs. Pertinent negatives per HPI. All others negative.   History   Social History  . Marital Status: Married    Spouse Name: [name]"  . Number of Children: 2  . Years of Education: 12+   Occupational History  . ADMINISTRATION    Social History Main Topics  . Smoking status: Never Smoker   . Smokeless tobacco: Never Used  . Alcohol Use: No  . Drug Use: No  . Sexual Activity: Yes   Other Topics Concern  . Not on file   Social History Narrative   Lives in [place].     Caffeine use: drinks coffee/tea: 2 cups coffee in the morning, drinks 1 glass tea per day        Family History  Problem Relation Age of Onset  . Breast cancer Sister   . Ovarian cancer Sister   . Uterine cancer Paternal Aunt   . Crohn's disease Other  neice  . Diabetes Mother   . Heart disease Mother   . Diabetes Maternal Grandmother   . Diabetes Sister   . Heart disease Father   . Heart disease Brother   . Heart disease Brother   . Colon cancer Neg Hx   . Stomach cancer Neg Hx     Past Medical History  Diagnosis Date  . Impaired glucose tolerance   . Psoriasis   . IBS (irritable bowel  syndrome)     vs diarrhea vs abd. fullness   . Chronic anxiety   . Uterine prolapse   . Bronchial spasms   . Hypertension   . Esophageal stricture   . Elevated blood sugar   . Diverticulosis   . Rectocele, female   . History of bladder repair surgery   . Hx of vaginal hysterectomy   . Vaginal prolapse 1998  . Hypothyroidism   . Psoriasis   . Tachy-brady syndrome     a. 08/19/2008 s/p PPM: SJM 2110 Accent  . Complication of anesthesia     " I shake real bad "  . Family history of anesthesia complication     Daughter also shakes while waking up  . GERD (gastroesophageal reflux disease)   . H/O hiatal hernia   . Neuromuscular disorder     periferal neuropathy  . Anemia   . Paroxysmal atrial fibrillation     a. failed flecainide, tikosyn, amio;  b. 01/2012 s/p RFCA.  . H/O scarlet fever   . Pacemaker     [employer]    Past Surgical History  Procedure Laterality Date  . Vaginal hysterectomy      prolapse   . Bladder suspension    . Rectocele repair    . Transthoracic echocardiogram  2008  . Tee without cardioversion  02/06/2012    Procedure: TRANSESOPHAGEAL ECHOCARDIOGRAM (TEE);  Surgeon: Thayer Headings, MD;  Location: [facility] ENDOSCOPY;  Service: Cardiovascular;  Laterality: N/A;  . Atrial fibrillation ablation  02/06/12    PVI by Dr Rayann Heman  . Insert / replace / remove pacemaker      SJM  . Atrial fibrillation ablation  07/04/2012    repeat PVI by Dr Rayann Heman  . Tee without cardioversion N/A 07/04/2012    Procedure: TRANSESOPHAGEAL ECHOCARDIOGRAM (TEE);  Surgeon: Lelon Perla, MD;  Location[facility] ENDOSCOPY;  Service: Cardiovascular;  Laterality: N/A;  . Atrial fibrillation ablation N/A 02/06/2012    Procedure: ATRIAL FIBRILLATION ABLATION;  Surgeon: Thompson Grayer, MD;  Location: [facility] CATH LAB;  Service: Cardiovascular;  Laterality: N/A;  . Atrial fibrillation ablation N/A 07/04/2012    Procedure: ATRIAL FIBRILLATION ABLATION;  Surgeon: Thompson Grayer, MD;  Location: [facility] CATH LAB;   Service: Cardiovascular;  Laterality: N/A;  . Left heart catheterization with coronary angiogram N/A 03/24/2013    Procedure: LEFT HEART CATHETERIZATION WITH CORONARY ANGIOGRAM;  Surgeon: Blane Ohara, MD;  Location: [facility] CATH LAB;  Service: Cardiovascular;  Laterality: N/A;    Current Outpatient Prescriptions  Medication Sig Dispense Refill  . acetaminophen (TYLENOL) 500 MG tablet Take 500 mg by mouth every 6 (six) hours as needed.    . bismuth subsalicylate (PEPTO BISMOL) 262 MG/15ML suspension Take 30 mLs by mouth every 4 (four) hours as needed for indigestion.    . Calcium Carbonate-Vitamin D (CALCIUM-VITAMIN D) 500-200 MG-UNIT per tablet Take 1 tablet by mouth daily.    . citalopram (CELEXA) 20 MG tablet Take 1 tablet (20 mg total) by mouth daily. 30 tablet 2  .  dicyclomine (BENTYL) 10 MG capsule Take 1 capsule (10 mg total) by mouth 4 (four) times daily -  before meals and at bedtime. 120 capsule 2  . fluticasone (FLONASE) 50 MCG/ACT nasal spray Place 2 sprays into both nostrils daily. 16 g 2  . levETIRAcetam (KEPPRA) 500 MG tablet Take 1 tablet (500 mg total) by mouth 2 (two) times daily. 60 tablet 3  . levothyroxine (SYNTHROID, LEVOTHROID) 88 MCG tablet TAKE 1 TABLET BY MOUTH DAILY BEFORE BREAKFAST 30 tablet 5  . loperamide (IMODIUM) 2 MG capsule Take by mouth as needed for diarrhea or loose stools.    . methotrexate (RHEUMATREX) 2.5 MG tablet Take 4 tablets (10 mg total) by mouth once a week. Caution:Chemotherapy. Protect from light. 16 tablet 1  . metoprolol (LOPRESSOR) 50 MG tablet Take 1 tablet (50 mg total) by mouth 2 (two) times daily. 180 tablet 1  . mirtazapine (REMERON) 15 MG tablet TAKE 1 TABLET BY MOUTH EVERY DAY 90 tablet 0  . omeprazole (PRILOSEC) 40 MG capsule Take 1 capsule (40 mg total) by mouth daily. 30 capsule 5  . ONE TOUCH ULTRA TEST test strip TEST ONCE DAILY AND AS NEEDED 100 each 5  . oxybutynin (DITROPAN) 5 MG tablet Take 5 mg by mouth at bedtime.    . potassium  chloride SA (K-DUR,KLOR-CON) 20 MEQ tablet TAKE 2 TABLETS BY MOUTH TWICE DAILY 120 tablet 2  . simvastatin (ZOCOR) 40 MG tablet TAKE 1 TABLET BY MOUTH EVERY DAY 30 tablet 2  . XARELTO 20 MG TABS tablet TAKE 1 TABLET BY MOUTH EVERY DAY 30 tablet 5   No current facility-administered medications for this visit.    Allergies as of 07/15/2014 - Review Complete 07/15/2014  Allergen Reaction Noted  . Latex Rash 06/27/2012  . Caudal tray [lidocaine-epinephrine]  06/22/2010  . Ciprofloxacin hcl Nausea Only 06/03/2012  . Codeine Nausea Only   . Diovan [valsartan] Itching 06/22/2010  . Doxycycline Other (See Comments)   . Esomeprazole magnesium  06/22/2010  . Levofloxacin  06/22/2010  . Myrbetriq [mirabegron] Diarrhea 12/01/2013  . Neomycin-bacitracin zn-polymyx    . Penicillins    . Sulfa drugs cross reactors Other (See Comments) 06/22/2010  . Verapamil  06/22/2010  . Vimovo [naproxen-esomeprazole]  06/22/2010  . Zocor [simvastatin]  06/22/2010    Vitals: BP 108/71 mmHg  Pulse 64  Ht 5\' 4"  (1.626 m) Last Weight:  Wt Readings from Last 1 Encounters:  06/11/14 142 lb (64.411 kg)   Last Height:   Ht Readings from Last 1 Encounters:  07/15/14 5\' 4"  (1.626 m)     Physical exam: Exam: Gen: NAD, not conversant with psychomotor slowing, well groomed                     CV: RRR, no MRG. No Carotid Bruits. No peripheral edema, warm, nontender Eyes: Conjunctivae clear without exudates or hemorrhage  Neuro: Detailed Neurologic Exam  Speech:    Expressive aphasia, comprehension appears to be intact Cognition:    The patient is oriented to person, place, and time;     recent and remote memory do not appear to be affected     normal attention, concentration,     fund of knowledge appears grossly intact Cranial Nerves:    The pupils are equal, round, and reactive to light. The fundi are normal and spontaneous venous pulsations are present. Visual fields are full to finger  confrontation. Extraocular movements are intact. Trigeminal sensation is intact and the muscles  of mastication are normal. Right lower facial droop. The palate elevates in the midline. Hearing intact. Voice is normal. Shoulder shrug is normal. The tongue has normal motion without fasciculations.   Coordination:    No dysmetria on the left, cannot perform on the right due to weakness  Gait:    With walker  Motor Observation:    No asymmetry, no atrophy, and no involuntary movements noted. Tone:    Normal muscle tone.    Posture:    Posture is normal. normal erect    Strength: right arm and hip flexion 4/5 and right DF 4+/5    Otherwise strength is V/V in the left upper and left lower limbs.      Sensation: intact to LT, there does not appear to be hemisensory loss, patient reports equal sensation bilaterally to touch. However difficult exam as patient is mostly nonverbal.. However I would expect hemisensory loss based on size and location of CVA.     Reflex Exam:  DTR's:    Deep tendon reflexes in the upper and lower extremities are normal bilaterally.   Toes:    Right upgoing   Clonus:    Clonus is absent.     Assessment/Plan:  69 year old patient who presents with her husband and son here for follow up of stroke with residual right-sided hemiparesis and aphasia. By account she appears to be doing very well after her stroke as far as physical therapy, occupational therapy and speech therapy are concern. However patient appears to have significant depression which is concerning for husband and son, discussed depression at length with family, this is very common after stroke. Patient is on Celexa for depression but this could be increased. Highly recommended following up with primary care to ensure there are no medical causes for patient's fatigue and depression including thyroid testing, ensuring there is no anemia, or any infection or any other metabolic, toxic etiology. They have a  follow-up appointment with primary care this Friday. Highly recommended following up with therapy and psychiatry if no other reason is found by primary care physician. The family appears very earnest in their concern and caring for patient, the effects of stroke on a family can be devastating. Discussed this at length with family.   Will request records from hospitalization, rehabilitation and neurologist. At this time patient should continue her [place] although I will review all records when received.. Follow up in 8-12 weeks and we can discuss in detail. Continue Xarelto and statin for stroke prevention. Follow closely with primary care for management of vascular risk factors. Continue physical therapy, occupational therapy, speech therapy.  Addendum 07/20/2014: Received extensive records.  [facility] rehabilitation consult notes: Patient was initially admitted to [facility] for electrophysiology study and radiofrequency ablation in February 2015. Unfortunately postoperatively she was noted to be weak on her right side. She was diagnosed with a left embolic MCA distribution CVA. She was also noted to have significant dysphagia and dysarthria. Resultant right-sided hemiparesis and hemisensory loss with dense expressive aphasia, dysarthria and dysphasia. She was admitted to the inpatient rehabilitation unit in 04/26/2013. Notes state that at that time of admission she had complete plegia 0/5 in the right upper extremity and right lower extremity. She was able to follow commands but was mostly nonverbal. Also reported right-sided hemisensory loss.  Per notes, patient appeared lethargic and CT of the head and EEG was performed in rehabilitation. CT on approx 04/29/2013 was compared with CT on 04/16/2013 from [facility],  showed an acute left MCA infarction with increased density in the proximal left MCA likely reflecting a thrombus but could be artifactual. No acute intracranial  hemorrhage. This finding again likely reflects the left MCA thrombus and now subacute left MCA infarction. EEG on 04/30/2013 showed mild encephalopathy, left cerebral hemisphere structural and/or physiologic brain damage or dysfunction secondary to recent large middle cerebral artery territory infarction, decreased threshold for seizure of bilateral cerebral hemisphere origin with focalization over the middle aspect of the temporal lobes. Right more than left. No clear evidence of active seizure. Overnight oximetry report was performed on 04/30/2013. The patient was on room air and observed to have slept during testing, recorded over 6 hours 32 minutes, the lowest SaO2 recorded was 93%, negative study for oxygen desaturation.  GI Consult Notes from 05/08/2013 discussed watery diarrhea, C. difficile testing was negative. There is no blood or mucus in the stool. At that time she was on tube feeds, diarrhea started after Glucerna. Diarrhea most likely due to tube feed induced diarrhea. Patient was again seen on April 1 as requested for the evaluation of persistent diarrhea. 24 notes state patient had at least 3-4 loose bowel movements daily, C. difficile has been negative, no leukocytosis. Negative workup for infectious etiology. Physician discussed the possible etiologies with patient and her husband. A sigmoidoscopy was recommended to look for pseudomembranous colitis, ulcerative colitis, microscopic or collagenous colitis. Patient was put on Levaquin, metronidazole. Sigmoidoscopy showed mild diverticulosis on the left side, patchy erythema with exudate consistent with colitis in the rectosigmoid area biopsied for treatment, sensitive and spastic colon.    Patient was discharged from rehabilitation on 05/23/2013. Per discharge summary: Patient made fair progression throughout her rehabilitation hospitalization.  At that time patient had passed a swallow study and was put on oral diet. Continued speech  therapy and SLP was recommended. Discharge summary states that neurology started patient on Ritalin for decreased attention. Discharge summary states that she was diagnosed with colitis and started on antibiotics. She did undergo a sigmoidoscopy the day prior to her discharge. Paxil was increased several days prior to her discharge due to depression. Patient was transferred from inpatient rehabilitation to skilled nursing.    EEG performed 08/01/2013 patient was sent from her subacute rehabilitation facility. Impression was abnormal EEG with finding indicating left cerebral hemisphere structural and/or physiologic brain damage or dysfunction. Epileptiform activity with epileptogenic potential over the left cerebral hemisphere (phase reversal at T3) with possibility of localization over the left mid temporal lobes.  Notes from neurology and sleep clinic of [place]: Notes state that this physician first saw patient in rehabilitation on 04/29/2013. Patient was lethargic and she had an abnormal EEG on 04/30/2013. At that time physician started on Keppra and performed an overnight pulse ox (see above for results). No clinical seizures were recorded. She was started on Keppra 250 mg 3 times a day. Repeat EEG was performed on 08/01/2013 (see above for results) EEG on 10/21/2013 was abnormal awake and asleep EEG showing brief electrographic subclinical seizures and interictal discharges arising from the left frontal temporal lobes. EEG 03/10/2014 showed minimal focal epileptiform activity arising from the anterior aspect of the left frontal temporal lobe with no electrographic subclinical seizures. Physician attributed her lethargy and cognitive mentation changes as attributed to seizure activity. Keppra was increased to 500 in the morning and 750 in the evenings in October 2015.  Still pending notes from [place]: reviewed 561 pages of  records. Significant details as  relating to her neurologic management was noted below:  Encounter date 02/28/2013: She had 2 catheter ablation procedures(2013 and 2014) for afib on xarelto and, s/p pacemaker, antiarrhythmic medications. Still with continued afib, fatigue and palpitations. Hx of impaired glucose control, HTN, DM,chronic back pain,  bronchospasm, hypothyroidism, IBS, peripheral neuropathy, anxiety,  and psoriasis. Discussed combined endocardial and epicardial ablation procedure.   03/24/2013: left heart cath, selective coronary angiography, LV angiography results show normal coronary arteries with nml left ventricular systolic function.  03/03/2013: PMHx paroxysmal afib, sinus node dysfunction and tachybradycardia syndrome s/p pacemaker in 2010. They discussed again  combined endocardial and epicardial ablation procedure or as a last resort AV node ablation.  Echo 03/2013: no intracardiac thrombus, nml left ventricular EF, nml right ventricular contractile performance.   Carotid duplex 04/14/2013: Right carotid: Evidence in the ICA of a less than 40% stenocis Left carotid: evidence in the ICA of a less than 40% stenosis. Non-hemodynamically significant plaque noted in the cca. Vertebrals: patent with ategrade flow Subclavians: normal flow bilaterally  2/24/105: procedure for transabdominal laporoscopic epicardial and endocardial radiofrequency ablation of the left atrium. Notes state successful hybrid ablation procedure for paroxysmal afib. Notes state that pt begain to wake up from anesthesia and not moving right arm. Right arm no response to pain. Patient non-verbal but responding by nodding, all other extremities moving appropriately. She did start to grimace to pain on the right arm.  Also noted to have right facial droop. Neurology was consulted.  Neurology note: noted right facial droop, patient following some simple commands, speech with aphasia, no movement in right arm and leg. NIHSS 25. She was not a tPA  candidate. CT head stat, could not have MRI due to pacemaker. Large L MCA stroke, increased density in the proximal left MCA likely thrombus  with resultant right-sided weakness and expressive aphasia with a left gaze preference.  ldl 21, hgba1c 6.2,   Neurology note 2/28: neurologically stable for 3 days and transferred back to the [facility] from the [facility]. Neurologic exam was limited due to hypersomnolence. Slight gaze preference, can cross the midline, right facial droop, decreased tone in the right arm with pronator drift, not moving r arm or leg even to noxious stim. Left side moving spontaneously, upgoing r toe.    EGD with peg tube 04/23/2013.Marland Kitchen  She was discharged to inpatient rehab(see details above) but did not regain any movement to her right side prior to discharge.               Sarina Ill, MD  [facility] [address] [place], [place] [address]  Phone [phone] Fax [phone]

## 2014-07-15 NOTE — Patient Instructions (Signed)
Overall you are doing fairly well but I do want to suggest a few things today:   Remember to drink plenty of fluid, eat healthy meals and do not skip any meals. Try to eat protein with a every meal and eat a healthy snack such as fruit or nuts in between meals. Try to keep a regular sleep-wake schedule and try to exercise daily, particularly in the form of walking, 20-30 minutes a day, if you can.   I would like to see you back in 3 months, sooner if we need to. Please call us with any interim questions, concerns, problems, updates or refill requests.   Please also call us for any test results so we can go over those with you on the phone.  My clinical assistant and will answer any of your questions and relay your messages to me and also relay most of my messages to you.   Our phone number is [phone]. We also have an after hours call service for urgent matters and there is a physician on-call for urgent questions. For any emergencies you know to call 911 or go to the nearest emergency room

## 2014-07-16 ENCOUNTER — Telehealth: Payer: Self-pay | Admitting: *Deleted

## 2014-07-16 NOTE — Telephone Encounter (Signed)
Release faxed to [facility], and [facility], Marvetta Gibbons, Neurology Sleep clinic on 07/16/14 requesting records.

## 2014-07-16 NOTE — Telephone Encounter (Signed)
Patient notes receive from [facility] notes on [place] desk.

## 2014-07-16 NOTE — Telephone Encounter (Signed)
Receive records from [facility]rology [facility] notes on [place].

## 2014-07-17 ENCOUNTER — Encounter: Payer: Self-pay | Admitting: *Deleted

## 2014-07-17 ENCOUNTER — Encounter: Payer: Self-pay | Admitting: Family Medicine

## 2014-07-17 ENCOUNTER — Ambulatory Visit (INDEPENDENT_AMBULATORY_CARE_PROVIDER_SITE_OTHER): Payer: Medicare Other | Admitting: Family Medicine

## 2014-07-17 VITALS — BP 126/77 | HR 72 | Temp 97.4°F | Ht 64.0 in | Wt 149.0 lb

## 2014-07-17 DIAGNOSIS — I639 Cerebral infarction, unspecified: Secondary | ICD-10-CM

## 2014-07-17 DIAGNOSIS — I10 Essential (primary) hypertension: Secondary | ICD-10-CM | POA: Diagnosis not present

## 2014-07-17 DIAGNOSIS — I480 Paroxysmal atrial fibrillation: Secondary | ICD-10-CM | POA: Diagnosis not present

## 2014-07-17 DIAGNOSIS — I6389 Other cerebral infarction: Secondary | ICD-10-CM

## 2014-07-17 DIAGNOSIS — I638 Other cerebral infarction: Secondary | ICD-10-CM | POA: Diagnosis not present

## 2014-07-17 DIAGNOSIS — R5383 Other fatigue: Secondary | ICD-10-CM | POA: Diagnosis not present

## 2014-07-17 DIAGNOSIS — I69328 Other speech and language deficits following cerebral infarction: Secondary | ICD-10-CM

## 2014-07-17 DIAGNOSIS — E039 Hypothyroidism, unspecified: Secondary | ICD-10-CM

## 2014-07-17 DIAGNOSIS — E785 Hyperlipidemia, unspecified: Secondary | ICD-10-CM

## 2014-07-17 DIAGNOSIS — R32 Unspecified urinary incontinence: Secondary | ICD-10-CM

## 2014-07-17 DIAGNOSIS — N393 Stress incontinence (female) (male): Secondary | ICD-10-CM

## 2014-07-17 DIAGNOSIS — Z1212 Encounter for screening for malignant neoplasm of rectum: Secondary | ICD-10-CM

## 2014-07-17 DIAGNOSIS — E1140 Type 2 diabetes mellitus with diabetic neuropathy, unspecified: Secondary | ICD-10-CM | POA: Diagnosis not present

## 2014-07-17 DIAGNOSIS — R350 Frequency of micturition: Secondary | ICD-10-CM | POA: Diagnosis not present

## 2014-07-17 DIAGNOSIS — E559 Vitamin D deficiency, unspecified: Secondary | ICD-10-CM | POA: Diagnosis not present

## 2014-07-17 DIAGNOSIS — G8311 Monoplegia of lower limb affecting right dominant side: Secondary | ICD-10-CM | POA: Diagnosis not present

## 2014-07-17 DIAGNOSIS — R7989 Other specified abnormal findings of blood chemistry: Secondary | ICD-10-CM | POA: Diagnosis not present

## 2014-07-17 LAB — POCT UA - MICROSCOPIC ONLY
Casts, Ur, LPF, POC: NEGATIVE
Crystals, Ur, HPF, POC: NEGATIVE
Mucus, UA: NEGATIVE
RBC, urine, microscopic: NEGATIVE
Yeast, UA: NEGATIVE

## 2014-07-17 LAB — POCT CBC
Granulocyte percent: 52 %G (ref 37–80)
HCT, POC: 41.4 % (ref 37.7–47.9)
Hemoglobin: 13.1 g/dL (ref 12.2–16.2)
Lymph, poc: 3.1 (ref 0.6–3.4)
MCH, POC: 29 pg (ref 27–31.2)
MCHC: 31.6 g/dL — AB (ref 31.8–35.4)
MCV: 91.6 fL (ref 80–97)
MPV: 6.5 fL (ref 0–99.8)
POC Granulocyte: 4.1 (ref 2–6.9)
POC LYMPH PERCENT: 40.3 %L (ref 10–50)
Platelet Count, POC: 263 10*3/uL (ref 142–424)
RBC: 4.52 M/uL (ref 4.04–5.48)
RDW, POC: 15.3 %
WBC: 7.8 10*3/uL (ref 4.6–10.2)

## 2014-07-17 LAB — POCT URINALYSIS DIPSTICK
Bilirubin, UA: NEGATIVE
Blood, UA: NEGATIVE
Glucose, UA: NEGATIVE
Ketones, UA: NEGATIVE
Nitrite, UA: POSITIVE
Protein, UA: NEGATIVE
Spec Grav, UA: 1.015
Urobilinogen, UA: NEGATIVE
pH, UA: 6

## 2014-07-17 LAB — POCT UA - MICROALBUMIN: Microalbumin Ur, POC: 50 mg/L

## 2014-07-17 LAB — POCT GLYCOSYLATED HEMOGLOBIN (HGB A1C): Hemoglobin A1C: 5.8

## 2014-07-17 NOTE — Progress Notes (Unsigned)
[name],  Dr Laurance Flatten wanted your opinion on med options for [name]'s depression. She is currently on celexa and we have received notes from her neurologist stating how down and depressed she is. She is not eating, not wanting to go out of house, etc...  We are thinking about sending her to a psych/ counselor as well.  She has an appt today at 2 pm, thank you!

## 2014-07-17 NOTE — Patient Instructions (Addendum)
Medicare Annual Wellness Visit  [facility] and the medical providers at [facility] strive to bring you the best medical care.  In doing so we not only want to address your current medical conditions and concerns but also to detect new conditions early and prevent illness, disease and health-related problems.    Medicare offers a yearly Wellness Visit which allows our clinical staff to assess your need for preventative services including immunizations, lifestyle education, counseling to decrease risk of preventable diseases and screening for fall risk and other medical concerns.    This visit is provided free of charge (no copay) for all Medicare recipients. The clinical pharmacists at [facility] have begun to conduct these Wellness Visits which will also include a thorough review of all your medications.    As you primary medical provider recommend that you make an appointment for your Annual Wellness Visit if you have not done so already this year.  You may set up this appointment before you leave today or you may call back ([phone]) and schedule an appointment.  Please make sure when you call that you mention that you are scheduling your Annual Wellness Visit with the clinical pharmacist so that the appointment may be made for the proper length of time.     Continue current medications. Continue good therapeutic lifestyle changes which include good diet and exercise. Fall precautions discussed with patient. If an FOBT was given today- please return it to our front desk. If you are over 52 years old - you may need Prevnar 24 or the adult Pneumonia vaccine.  Flu Shots are still available at our office. If you still haven't had one please call to set up a nurse visit to get one.   After your visit with [place] today you will receive a survey in the mail or online from [employer] regarding your care with [place]. Please take a moment to  fill this out. Your feedback is very important to [place] as you can help [place] better understand your patient needs as well as improve your experience and satisfaction. WE CARE ABOUT YOU!!!   We'll make an appointment for you to visit with a psychiatrist Dr. Tomasita Crumble McKinney-----this may take some time and they may require payment upfront that we will do this so we can provide you with the best care for your depression Increase the Celexa to 20 mg twice daily Call in one month and let us know how this increase is doing Because of the incontinence which may be related to the stroke, we will arrange a visit for you to see Dr. Jeffie Pollock to see if he has any other suggestions for helping her bladder control Drink plenty of water daily stay as active as possible Monitor blood sugars at home when possible

## 2014-07-17 NOTE — Addendum Note (Signed)
Addended by: Earlene Plater on: 07/17/2014 03:42 PM   Modules accepted: Orders

## 2014-07-17 NOTE — Addendum Note (Signed)
Addended by: Earlene Plater on: 07/17/2014 04:33 PM   Modules accepted: Orders

## 2014-07-17 NOTE — Progress Notes (Unsigned)
Patient ID: [name], female   DOB: 11-Aug-1945, 69 y.o.   MRN: [id number]  Consider a SSRI / SNRI combo like cymbalta 30mg  daily or effexor /venlafaxine.   Monitor BP / HR.

## 2014-07-17 NOTE — Progress Notes (Signed)
Subjective:    Patient ID: [name], female    DOB: [birth year], 69 y.o.   MRN: [id number]  HPI Pt here for follow up and management of chronic medical problems which includes hypothyroid, depression, hyperlipidemia, and history of Cerebral infarction. The patient saw the neurologist yesterday and his note is available for review in the record. He felt like her depression was profound and is not getting any better. She has been taking Celexa 20 once daily. She comes to the visit today complaining of some low back pain and frequency and we will check a urine specimen for this. The patient saw the cardiologist in April he reduce her metoprolol to 50 mg twice a day and discontinued her flecainide. She was in normal sinus rhythm at that time. He also discontinued her aspirin and told her she should stay on the Xarelto indefinitely. He is going to see her back in 6 months. She comes to the visit today with her husband who is very attentive to her needs and wanting her to get better. The patient denies chest pain, shortness of breath problems with eating. She does have incontinence and frequency. She also has some low back pain.    Patient Active Problem List   Diagnosis Date Noted  . Cerebral infarction due to embolism of other cerebral artery 09/03/2013  . Metabolic syndrome 89/38/1017  . High risk medication use 03/13/2013  . Fever, unspecified 06/05/2012  . Generalized abdominal pain 06/05/2012  . PAF (paroxysmal atrial fibrillation) 02/07/2012  . Tachy-brady syndrome   . Impaired glucose tolerance   . Fatigue 12/22/2009  . SNORING 12/22/2009  . OTHER CHRONIC NONALCOHOLIC LIVER DISEASE 51/03/5850  . NONSPEC ELEVATION OF LEVELS OF TRANSAMINASE/LDH 03/09/2009  . PPM-St.Jude 09/01/2008  . BRADYCARDIA 08/13/2008  . HYPOTHYROIDISM 06/09/2008  . HTN (hypertension) 06/09/2008  . Atrial fibrillation 06/09/2008  . PREMATURE VENTRICULAR CONTRACTIONS 06/09/2008  . IRRITABLE BOWEL SYNDROME  06/09/2008  . PSORIASIS 06/09/2008  . EDEMA 06/09/2008  . DIVERTICULOSIS, COLON, HX OF 06/09/2008   Outpatient Encounter Prescriptions as of 07/17/2014  Medication Sig  . acetaminophen (TYLENOL) 500 MG tablet Take 500 mg by mouth every 6 (six) hours as needed.  . bismuth subsalicylate (PEPTO BISMOL) 262 MG/15ML suspension Take 30 mLs by mouth every 4 (four) hours as needed for indigestion.  . Calcium Carbonate-Vitamin D (CALCIUM-VITAMIN D) 500-200 MG-UNIT per tablet Take 1 tablet by mouth daily.  . cholecalciferol (VITAMIN D) 1000 UNITS tablet Take 1,000 Units by mouth daily.  . citalopram (CELEXA) 20 MG tablet Take 1 tablet (20 mg total) by mouth daily.  Marland Kitchen dicyclomine (BENTYL) 10 MG capsule Take 1 capsule (10 mg total) by mouth 4 (four) times daily -  before meals and at bedtime.  . fluticasone (FLONASE) 50 MCG/ACT nasal spray Place 2 sprays into both nostrils daily.  Marland Kitchen levETIRAcetam (KEPPRA) 500 MG tablet Take 1 tablet (500 mg total) by mouth 2 (two) times daily.  Marland Kitchen levothyroxine (SYNTHROID, LEVOTHROID) 88 MCG tablet TAKE 1 TABLET BY MOUTH DAILY BEFORE BREAKFAST  . loperamide (IMODIUM) 2 MG capsule Take by mouth as needed for diarrhea or loose stools.  . methotrexate (RHEUMATREX) 2.5 MG tablet Take 4 tablets (10 mg total) by mouth once a week. Caution:Chemotherapy. Protect from light.  . metoprolol (LOPRESSOR) 50 MG tablet Take 1 tablet (50 mg total) by mouth 2 (two) times daily.  . mirtazapine (REMERON) 15 MG tablet TAKE 1 TABLET BY MOUTH EVERY DAY  . omeprazole (PRILOSEC) 40 MG capsule Take  1 capsule (40 mg total) by mouth daily.  . ONE TOUCH ULTRA TEST test strip TEST ONCE DAILY AND AS NEEDED  . potassium chloride SA (K-DUR,KLOR-CON) 20 MEQ tablet TAKE 2 TABLETS BY MOUTH TWICE DAILY  . simvastatin (ZOCOR) 40 MG tablet TAKE 1 TABLET BY MOUTH EVERY DAY  . XARELTO 20 MG TABS tablet TAKE 1 TABLET BY MOUTH EVERY DAY  . [DISCONTINUED] oxybutynin (DITROPAN) 5 MG tablet Take 5 mg by mouth at  bedtime.   No facility-administered encounter medications on file as of 07/17/2014.     Review of Systems  Constitutional: Negative.   HENT: Negative.   Eyes: Negative.   Respiratory: Negative.   Cardiovascular: Negative.   Gastrointestinal: Negative.   Endocrine: Negative.   Genitourinary: Positive for frequency.  Musculoskeletal: Positive for back pain (low back pain).  Skin: Negative.   Allergic/Immunologic: Negative.   Neurological: Negative.   Hematological: Negative.   Psychiatric/Behavioral: Negative.        Objective:   Physical Exam  Constitutional: She is oriented to person, place, and time. No distress.  The patient comes to the visit with her husband was able to come in with a cane. She has makeup on and is smiling and is communicating although her speech is somewhat garbled. She responds appropriately to questions asked of her. She acknowledges depression and is willing to go talk to someone about this to get better. She also acknowledges that she has problems with voiding and incontinence and is willing to go see the urologist about this.  HENT:  Head: Normocephalic and atraumatic.  Right Ear: External ear normal.  Left Ear: External ear normal.  Nose: Nose normal.  Mouth/Throat: Oropharynx is clear and moist.  Eyes: Conjunctivae and EOM are normal. Pupils are equal, round, and reactive to light. Right eye exhibits no discharge. Left eye exhibits no discharge. No scleral icterus.  Neck: Normal range of motion. Neck supple. No thyromegaly present.  Cardiovascular: Normal rate, regular rhythm, normal heart sounds and intact distal pulses.  Exam reveals no gallop and no friction rub.   No murmur heard. The rhythm is regular today at 72/m  Pulmonary/Chest: Effort normal and breath sounds normal. No respiratory distress. She has no wheezes. She has no rales. She exhibits no tenderness.  Lungs were clear anteriorly and posteriorly  Abdominal: Soft. Bowel sounds are  normal. She exhibits no mass. There is no tenderness. There is no rebound and no guarding.  Abdomen and suprapubic areas were nontender to palpation  Musculoskeletal: Normal range of motion. She exhibits no edema or tenderness.  The patient has weakness in the right lower extremity in the right arm.  Lymphadenopathy:    She has no cervical adenopathy.  Neurological: She is alert and oriented to person, place, and time. No cranial nerve deficit.  Sensation was intact in both feet Weakness right lower extremity and right arm  Skin: Skin is warm and dry. No rash noted.  Psychiatric: She has a normal mood and affect. Her behavior is normal. Judgment and thought content normal.  The mood was definitely more positive today.  Nursing note and vitals reviewed.  BP 126/77 mmHg  Pulse 72  Temp(Src) 97.4 F (36.3 C) (Oral)  Ht $R'5\' 4"'bS$  (1.626 m)  Wt 149 lb (67.586 kg)  BMI 25.56 kg/m2        Assessment & Plan:  1. Vitamin D deficiency -The patient will get lab work today and her vitamin D dosage will depend upon the results  -  POCT CBC - Vit D  25 hydroxy (rtn osteoporosis monitoring)  2. Type 2 diabetes mellitus with diabetic neuropathy -She will continue current treatment depending results from lab work today - POCT CBC - POCT UA - Microscopic Only - POCT glycosylated hemoglobin (Hb A1C)  3. Essential hypertension -Blood pressure is good and she will continue with current treatment - POCT CBC - BMP8+EGFR - Hepatic function panel  4. Hyperlipidemia -She will continue simvastatin depending on results of lab work - POCT CBC - Lipid panel  5. Hypothyroidism, unspecified hypothyroidism type -Because of her depression and the recommendation of the neurologist we will recheck her thyroid profile today. - POCT CBC - Thyroid Panel With TSH - Vitamin B12  6. PAF (paroxysmal atrial fibrillation) -The rhythm was regular today and she was in normal sinus rhythm. - POCT CBC  7.  Cerebral infarction due to other mechanism -The patient continues to have residual weakness in the right arm and right lower extremity but is able to walk using a cane. -She has plans to follow-up with the neurologist in about 3 months - POCT CBC  8. Urine frequency -We will check a urine today and do a urine culture and sensitivity if necessary and because of her incontinence we will arrange for her to see a urologist as soon as possible - POCT UA - Microalbumin - POCT urinalysis dipstick - Urine culture - Ambulatory referral to Urology  9. 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 Paralysis of right lower extremity -Continue with physical therapy and rehabilitation therapy - Thyroid Panel With TSH - Vitamin B12  11. Incontinence in female -Appointment with urology as planned and we will make sure there is no infection to treat prior to that. - Ambulatory referral to Urology  12. Depression -We will make a referral to Dr. Letta Moynahan for further evaluation and we will have her increase her Celexa to 20 mg twice daily -The husband will call us in 4 weeks with her progress  Patient Instructions                       Medicare Annual Wellness Visit  [facility] and the medical providers at [facility] strive to bring you the best medical care.  In doing so we not only want to address your current medical conditions and concerns but also to detect new conditions early and prevent illness, disease and health-related problems.    Medicare offers a yearly Wellness Visit which allows our clinical staff to assess your need for preventative services including immunizations, lifestyle education, counseling to decrease risk of preventable diseases and screening for fall risk and other medical concerns.    This visit is provided free of charge (no copay) for all Medicare recipients. The clinical pharmacists at [facility] have begun to conduct these Wellness Visits which will also include a thorough review of all your medications.    As you primary medical provider recommend that you make an appointment for your Annual Wellness Visit if you have not done so already this year.  You may set up this appointment before you leave today or you may call back ([phone]) and schedule an appointment.  Please make sure when you call that you mention that you are scheduling your Annual Wellness Visit with the clinical pharmacist so that the appointment may be made for the proper length of time.  Continue current medications. Continue good therapeutic lifestyle changes which include good diet and exercise. Fall precautions discussed with patient. If an FOBT was given today- please return it to our front desk. If you are over 66 years old - you may need Prevnar 29 or the adult Pneumonia vaccine.  Flu Shots are still available at our office. If you still haven't had one please call to set up a nurse visit to get one.   After your visit with [place] today you will receive a survey in the mail or online from [employer] regarding your care with [place]. Please take a moment to fill this out. Your feedback is very important to [place] as you can help [place] better understand your patient needs as well as improve your experience and satisfaction. WE CARE ABOUT YOU!!!   We'll make an appointment for you to visit with a psychiatrist Dr. Tomasita Crumble McKinney-----this may take some time and they may require payment upfront that we will do this so we can provide you with the best care for your depression Increase the Celexa to 20 mg twice daily Call in one month and let us know how this increase is doing Because of the incontinence which may be related to the stroke, we will arrange a visit for you to see Dr. Jeffie Pollock to see if he has any other suggestions for helping her bladder control Drink plenty of water daily stay as active as  possible Monitor blood sugars at home when possible   Arrie Senate MD

## 2014-07-18 LAB — HEPATIC FUNCTION PANEL
ALT: 22 IU/L (ref 0–32)
AST: 35 IU/L (ref 0–40)
Albumin: 4.1 g/dL (ref 3.6–4.8)
Alkaline Phosphatase: 68 IU/L (ref 39–117)
Bilirubin Total: 0.3 mg/dL (ref 0.0–1.2)
Bilirubin, Direct: 0.13 mg/dL (ref 0.00–0.40)
Total Protein: 6.3 g/dL (ref 6.0–8.5)

## 2014-07-18 LAB — LIPID PANEL
Chol/HDL Ratio: 2.3 ratio units (ref 0.0–4.4)
Cholesterol, Total: 117 mg/dL (ref 100–199)
HDL: 50 mg/dL (ref >39)
LDL Calculated: 34 mg/dL (ref 0–99)
Triglycerides: 165 mg/dL — ABNORMAL HIGH (ref 0–149)
VLDL Cholesterol Cal: 33 mg/dL (ref 5–40)

## 2014-07-18 LAB — BMP8+EGFR
BUN/Creatinine Ratio: 24 (ref 11–26)
BUN: 12 mg/dL (ref 8–27)
CO2: 25 mmol/L (ref 18–29)
Calcium: 9.2 mg/dL (ref 8.7–10.3)
Chloride: 105 mmol/L (ref 97–108)
Creatinine, Ser: 0.51 mg/dL — ABNORMAL LOW (ref 0.57–1.00)
GFR calc Af Amer: 116 mL/min/{1.73_m2} (ref >59)
GFR calc non Af Amer: 101 mL/min/{1.73_m2} (ref >59)
Glucose: 101 mg/dL — ABNORMAL HIGH (ref 65–99)
Potassium: 5.1 mmol/L (ref 3.5–5.2)
Sodium: 144 mmol/L (ref 134–144)

## 2014-07-18 LAB — THYROID PANEL WITH TSH
Free Thyroxine Index: 2.3 (ref 1.2–4.9)
T3 Uptake Ratio: 22 % — ABNORMAL LOW (ref 24–39)
T4, Total: 10.6 ug/dL (ref 4.5–12.0)
TSH: 0.883 u[IU]/mL (ref 0.450–4.500)

## 2014-07-18 LAB — VITAMIN D 25 HYDROXY (VIT D DEFICIENCY, FRACTURES): Vit D, 25-Hydroxy: 44 ng/mL (ref 30.0–100.0)

## 2014-07-18 LAB — VITAMIN B12: Vitamin B-12: 545 pg/mL (ref 211–946)

## 2014-07-18 LAB — MICROALBUMIN, URINE: Microalbumin, Urine: 20.2 ug/mL

## 2014-07-19 LAB — URINE CULTURE

## 2014-07-19 LAB — FECAL OCCULT BLOOD, IMMUNOCHEMICAL: Fecal Occult Bld: NEGATIVE

## 2014-07-20 DIAGNOSIS — M6281 Muscle weakness (generalized): Secondary | ICD-10-CM | POA: Diagnosis not present

## 2014-07-20 DIAGNOSIS — R262 Difficulty in walking, not elsewhere classified: Secondary | ICD-10-CM | POA: Diagnosis not present

## 2014-07-20 DIAGNOSIS — R2689 Other abnormalities of gait and mobility: Secondary | ICD-10-CM | POA: Diagnosis not present

## 2014-07-21 ENCOUNTER — Other Ambulatory Visit: Payer: Self-pay | Admitting: *Deleted

## 2014-07-21 ENCOUNTER — Ambulatory Visit (INDEPENDENT_AMBULATORY_CARE_PROVIDER_SITE_OTHER): Payer: Medicare Other | Admitting: Family Medicine

## 2014-07-21 DIAGNOSIS — R262 Difficulty in walking, not elsewhere classified: Secondary | ICD-10-CM

## 2014-07-21 DIAGNOSIS — R2689 Other abnormalities of gait and mobility: Secondary | ICD-10-CM | POA: Diagnosis not present

## 2014-07-21 DIAGNOSIS — I679 Cerebrovascular disease, unspecified: Secondary | ICD-10-CM | POA: Diagnosis not present

## 2014-07-21 DIAGNOSIS — M6281 Muscle weakness (generalized): Secondary | ICD-10-CM | POA: Diagnosis not present

## 2014-07-21 MED ORDER — CITALOPRAM HYDROBROMIDE 20 MG PO TABS: 20.0000 mg | ORAL_TABLET | Freq: Two times a day (BID) | ORAL | Status: DC

## 2014-07-21 MED ORDER — NITROFURANTOIN MACROCRYSTAL 100 MG PO CAPS: 100.0000 mg | ORAL_CAPSULE | Freq: Two times a day (BID) | ORAL | Status: DC

## 2014-07-22 ENCOUNTER — Telehealth: Payer: Self-pay | Admitting: Family Medicine

## 2014-07-22 NOTE — Telephone Encounter (Signed)
Patient states that since increasing the citalopram she is weak, flushed in color, and fatigued. I advised him not to give her the bedtime dose and that we will send this over to provider to review.

## 2014-07-22 NOTE — Telephone Encounter (Signed)
Decrease Celexa back to 20 mg daily

## 2014-07-23 NOTE — Telephone Encounter (Signed)
Pt husband aware

## 2014-07-27 DIAGNOSIS — M6281 Muscle weakness (generalized): Secondary | ICD-10-CM | POA: Diagnosis not present

## 2014-07-27 DIAGNOSIS — I639 Cerebral infarction, unspecified: Secondary | ICD-10-CM | POA: Diagnosis not present

## 2014-07-27 DIAGNOSIS — I679 Cerebrovascular disease, unspecified: Secondary | ICD-10-CM | POA: Diagnosis not present

## 2014-07-27 DIAGNOSIS — R262 Difficulty in walking, not elsewhere classified: Secondary | ICD-10-CM | POA: Diagnosis not present

## 2014-07-29 ENCOUNTER — Telehealth: Payer: Self-pay | Admitting: *Deleted

## 2014-07-29 DIAGNOSIS — R262 Difficulty in walking, not elsewhere classified: Secondary | ICD-10-CM | POA: Diagnosis not present

## 2014-07-29 DIAGNOSIS — M6281 Muscle weakness (generalized): Secondary | ICD-10-CM | POA: Diagnosis not present

## 2014-07-29 DIAGNOSIS — I639 Cerebral infarction, unspecified: Secondary | ICD-10-CM | POA: Diagnosis not present

## 2014-07-29 DIAGNOSIS — I679 Cerebrovascular disease, unspecified: Secondary | ICD-10-CM | POA: Diagnosis not present

## 2014-07-29 NOTE — Telephone Encounter (Signed)
Receive records from [facility] on 07/29/14 records on [place].

## 2014-07-31 ENCOUNTER — Other Ambulatory Visit: Payer: Self-pay | Admitting: Family Medicine

## 2014-08-03 ENCOUNTER — Telehealth: Payer: Self-pay | Admitting: Family Medicine

## 2014-08-03 DIAGNOSIS — M6281 Muscle weakness (generalized): Secondary | ICD-10-CM | POA: Diagnosis not present

## 2014-08-03 DIAGNOSIS — I639 Cerebral infarction, unspecified: Secondary | ICD-10-CM | POA: Diagnosis not present

## 2014-08-03 DIAGNOSIS — R262 Difficulty in walking, not elsewhere classified: Secondary | ICD-10-CM | POA: Diagnosis not present

## 2014-08-03 DIAGNOSIS — I679 Cerebrovascular disease, unspecified: Secondary | ICD-10-CM | POA: Diagnosis not present

## 2014-08-03 NOTE — Telephone Encounter (Signed)
Pt still having UTI issues, she has an appt with Dr. Jeffie Pollock on Friday, needing pain medication to get her through till her appt on Friday. Send Rx if possible to [facility] which is in chart

## 2014-08-03 NOTE — Telephone Encounter (Signed)
Try Macrobid twice daily with food for 7 days and see if this helps. This was based on her last urine culture and sensitivity. Did the patient see Dr. Jeffie Pollock?

## 2014-08-04 NOTE — Telephone Encounter (Signed)
Tramadol 50 mg, one half 4 times a day if needed for severe pain #28 no refills

## 2014-08-05 DIAGNOSIS — M6281 Muscle weakness (generalized): Secondary | ICD-10-CM | POA: Diagnosis not present

## 2014-08-05 DIAGNOSIS — I639 Cerebral infarction, unspecified: Secondary | ICD-10-CM | POA: Diagnosis not present

## 2014-08-05 DIAGNOSIS — R262 Difficulty in walking, not elsewhere classified: Secondary | ICD-10-CM | POA: Diagnosis not present

## 2014-08-05 DIAGNOSIS — I679 Cerebrovascular disease, unspecified: Secondary | ICD-10-CM | POA: Diagnosis not present

## 2014-08-05 MED ORDER — LEVETIRACETAM 500 MG PO TABS: ORAL_TABLET | ORAL | Status: DC

## 2014-08-05 MED ORDER — TRAMADOL HCL 50 MG PO TABS: ORAL_TABLET | ORAL | Status: DC

## 2014-08-05 NOTE — Telephone Encounter (Signed)
Pt aware - will call in this one time - per the Drive here.

## 2014-08-06 DIAGNOSIS — I639 Cerebral infarction, unspecified: Secondary | ICD-10-CM | POA: Diagnosis not present

## 2014-08-06 DIAGNOSIS — I679 Cerebrovascular disease, unspecified: Secondary | ICD-10-CM | POA: Diagnosis not present

## 2014-08-06 DIAGNOSIS — R262 Difficulty in walking, not elsewhere classified: Secondary | ICD-10-CM | POA: Diagnosis not present

## 2014-08-06 DIAGNOSIS — M6281 Muscle weakness (generalized): Secondary | ICD-10-CM | POA: Diagnosis not present

## 2014-08-07 ENCOUNTER — Ambulatory Visit (INDEPENDENT_AMBULATORY_CARE_PROVIDER_SITE_OTHER): Payer: Medicare Other | Admitting: Urology

## 2014-08-07 DIAGNOSIS — N390 Urinary tract infection, site not specified: Secondary | ICD-10-CM | POA: Diagnosis not present

## 2014-08-07 DIAGNOSIS — N3020 Other chronic cystitis without hematuria: Secondary | ICD-10-CM | POA: Diagnosis not present

## 2014-08-07 DIAGNOSIS — R339 Retention of urine, unspecified: Secondary | ICD-10-CM | POA: Diagnosis not present

## 2014-08-07 DIAGNOSIS — N319 Neuromuscular dysfunction of bladder, unspecified: Secondary | ICD-10-CM

## 2014-08-07 DIAGNOSIS — N3941 Urge incontinence: Secondary | ICD-10-CM

## 2014-08-10 DIAGNOSIS — M6281 Muscle weakness (generalized): Secondary | ICD-10-CM | POA: Diagnosis not present

## 2014-08-10 DIAGNOSIS — I679 Cerebrovascular disease, unspecified: Secondary | ICD-10-CM | POA: Diagnosis not present

## 2014-08-10 DIAGNOSIS — R262 Difficulty in walking, not elsewhere classified: Secondary | ICD-10-CM | POA: Diagnosis not present

## 2014-08-10 DIAGNOSIS — I639 Cerebral infarction, unspecified: Secondary | ICD-10-CM | POA: Diagnosis not present

## 2014-08-12 DIAGNOSIS — I679 Cerebrovascular disease, unspecified: Secondary | ICD-10-CM | POA: Diagnosis not present

## 2014-08-12 DIAGNOSIS — R262 Difficulty in walking, not elsewhere classified: Secondary | ICD-10-CM | POA: Diagnosis not present

## 2014-08-12 DIAGNOSIS — M6281 Muscle weakness (generalized): Secondary | ICD-10-CM | POA: Diagnosis not present

## 2014-08-12 DIAGNOSIS — I639 Cerebral infarction, unspecified: Secondary | ICD-10-CM | POA: Diagnosis not present

## 2014-08-13 ENCOUNTER — Other Ambulatory Visit: Payer: Self-pay | Admitting: Family Medicine

## 2014-08-14 ENCOUNTER — Other Ambulatory Visit: Payer: Self-pay | Admitting: Family Medicine

## 2014-08-17 DIAGNOSIS — I679 Cerebrovascular disease, unspecified: Secondary | ICD-10-CM | POA: Diagnosis not present

## 2014-08-17 DIAGNOSIS — M6281 Muscle weakness (generalized): Secondary | ICD-10-CM | POA: Diagnosis not present

## 2014-08-17 DIAGNOSIS — I639 Cerebral infarction, unspecified: Secondary | ICD-10-CM | POA: Diagnosis not present

## 2014-08-17 DIAGNOSIS — R262 Difficulty in walking, not elsewhere classified: Secondary | ICD-10-CM | POA: Diagnosis not present

## 2014-08-18 ENCOUNTER — Encounter: Payer: Self-pay | Admitting: *Deleted

## 2014-08-18 DIAGNOSIS — I679 Cerebrovascular disease, unspecified: Secondary | ICD-10-CM | POA: Diagnosis not present

## 2014-08-18 DIAGNOSIS — I639 Cerebral infarction, unspecified: Secondary | ICD-10-CM | POA: Diagnosis not present

## 2014-08-18 DIAGNOSIS — R262 Difficulty in walking, not elsewhere classified: Secondary | ICD-10-CM | POA: Diagnosis not present

## 2014-08-18 DIAGNOSIS — M6281 Muscle weakness (generalized): Secondary | ICD-10-CM | POA: Diagnosis not present

## 2014-08-18 NOTE — Progress Notes (Signed)
Plan of Care faxed to [facility], fax # [phone], with fax confirmation received, then sent to be scanned/fim

## 2014-08-19 DIAGNOSIS — I639 Cerebral infarction, unspecified: Secondary | ICD-10-CM | POA: Diagnosis not present

## 2014-08-19 DIAGNOSIS — I679 Cerebrovascular disease, unspecified: Secondary | ICD-10-CM | POA: Diagnosis not present

## 2014-08-19 DIAGNOSIS — M6281 Muscle weakness (generalized): Secondary | ICD-10-CM | POA: Diagnosis not present

## 2014-08-19 DIAGNOSIS — R262 Difficulty in walking, not elsewhere classified: Secondary | ICD-10-CM | POA: Diagnosis not present

## 2014-08-20 ENCOUNTER — Other Ambulatory Visit: Payer: Self-pay | Admitting: Family Medicine

## 2014-08-26 DIAGNOSIS — I679 Cerebrovascular disease, unspecified: Secondary | ICD-10-CM | POA: Diagnosis not present

## 2014-08-26 DIAGNOSIS — R262 Difficulty in walking, not elsewhere classified: Secondary | ICD-10-CM | POA: Diagnosis not present

## 2014-08-26 DIAGNOSIS — I639 Cerebral infarction, unspecified: Secondary | ICD-10-CM | POA: Diagnosis not present

## 2014-08-26 DIAGNOSIS — M6281 Muscle weakness (generalized): Secondary | ICD-10-CM | POA: Diagnosis not present

## 2014-08-31 DIAGNOSIS — R262 Difficulty in walking, not elsewhere classified: Secondary | ICD-10-CM | POA: Diagnosis not present

## 2014-08-31 DIAGNOSIS — I679 Cerebrovascular disease, unspecified: Secondary | ICD-10-CM | POA: Diagnosis not present

## 2014-08-31 DIAGNOSIS — M6281 Muscle weakness (generalized): Secondary | ICD-10-CM | POA: Diagnosis not present

## 2014-08-31 DIAGNOSIS — I639 Cerebral infarction, unspecified: Secondary | ICD-10-CM | POA: Diagnosis not present

## 2014-09-01 ENCOUNTER — Telehealth: Payer: Self-pay | Admitting: Family Medicine

## 2014-09-01 DIAGNOSIS — I679 Cerebrovascular disease, unspecified: Secondary | ICD-10-CM | POA: Diagnosis not present

## 2014-09-01 DIAGNOSIS — I639 Cerebral infarction, unspecified: Secondary | ICD-10-CM | POA: Diagnosis not present

## 2014-09-01 DIAGNOSIS — M6281 Muscle weakness (generalized): Secondary | ICD-10-CM | POA: Diagnosis not present

## 2014-09-01 DIAGNOSIS — R262 Difficulty in walking, not elsewhere classified: Secondary | ICD-10-CM | POA: Diagnosis not present

## 2014-09-02 DIAGNOSIS — R262 Difficulty in walking, not elsewhere classified: Secondary | ICD-10-CM | POA: Diagnosis not present

## 2014-09-02 DIAGNOSIS — I679 Cerebrovascular disease, unspecified: Secondary | ICD-10-CM | POA: Diagnosis not present

## 2014-09-02 DIAGNOSIS — I639 Cerebral infarction, unspecified: Secondary | ICD-10-CM | POA: Diagnosis not present

## 2014-09-02 DIAGNOSIS — M6281 Muscle weakness (generalized): Secondary | ICD-10-CM | POA: Diagnosis not present

## 2014-09-07 DIAGNOSIS — R262 Difficulty in walking, not elsewhere classified: Secondary | ICD-10-CM | POA: Diagnosis not present

## 2014-09-07 DIAGNOSIS — M6281 Muscle weakness (generalized): Secondary | ICD-10-CM | POA: Diagnosis not present

## 2014-09-07 DIAGNOSIS — I679 Cerebrovascular disease, unspecified: Secondary | ICD-10-CM | POA: Diagnosis not present

## 2014-09-07 DIAGNOSIS — I639 Cerebral infarction, unspecified: Secondary | ICD-10-CM | POA: Diagnosis not present

## 2014-09-09 ENCOUNTER — Telehealth: Payer: Self-pay | Admitting: Cardiology

## 2014-09-09 ENCOUNTER — Encounter: Payer: Medicare Other | Admitting: *Deleted

## 2014-09-09 DIAGNOSIS — I679 Cerebrovascular disease, unspecified: Secondary | ICD-10-CM | POA: Diagnosis not present

## 2014-09-09 DIAGNOSIS — M6281 Muscle weakness (generalized): Secondary | ICD-10-CM | POA: Diagnosis not present

## 2014-09-09 DIAGNOSIS — R262 Difficulty in walking, not elsewhere classified: Secondary | ICD-10-CM | POA: Diagnosis not present

## 2014-09-09 DIAGNOSIS — I639 Cerebral infarction, unspecified: Secondary | ICD-10-CM | POA: Diagnosis not present

## 2014-09-09 NOTE — Telephone Encounter (Signed)
Confirmed remote transmission w/ pt husband.   

## 2014-09-10 ENCOUNTER — Encounter: Payer: Self-pay | Admitting: Cardiology

## 2014-09-10 ENCOUNTER — Encounter: Payer: Self-pay | Admitting: *Deleted

## 2014-09-11 ENCOUNTER — Other Ambulatory Visit: Payer: Self-pay | Admitting: Family Medicine

## 2014-09-14 ENCOUNTER — Encounter: Payer: Self-pay | Admitting: *Deleted

## 2014-09-14 DIAGNOSIS — I639 Cerebral infarction, unspecified: Secondary | ICD-10-CM | POA: Diagnosis not present

## 2014-09-14 DIAGNOSIS — R262 Difficulty in walking, not elsewhere classified: Secondary | ICD-10-CM | POA: Diagnosis not present

## 2014-09-14 DIAGNOSIS — I679 Cerebrovascular disease, unspecified: Secondary | ICD-10-CM | POA: Diagnosis not present

## 2014-09-14 DIAGNOSIS — M6281 Muscle weakness (generalized): Secondary | ICD-10-CM | POA: Diagnosis not present

## 2014-09-14 NOTE — Progress Notes (Signed)
Faxed signed copy of POC from Shelly Rubenstein at [facility] and signed order for Modified Barium Swallow Study. Received fax confirmation for both. Copies sent to MR. Faxed to [phone].

## 2014-09-17 ENCOUNTER — Ambulatory Visit (INDEPENDENT_AMBULATORY_CARE_PROVIDER_SITE_OTHER): Payer: Medicare Other | Admitting: *Deleted

## 2014-09-17 ENCOUNTER — Encounter: Payer: Self-pay | Admitting: Internal Medicine

## 2014-09-17 ENCOUNTER — Telehealth: Payer: Self-pay | Admitting: *Deleted

## 2014-09-17 DIAGNOSIS — I495 Sick sinus syndrome: Secondary | ICD-10-CM | POA: Diagnosis not present

## 2014-09-17 NOTE — Telephone Encounter (Signed)
I updated pt's husband, [name], a remote was not received. He plugged her Merlin back in and initiated a manual transmission. We attempted this several times. Remote was eventually received.

## 2014-09-23 ENCOUNTER — Encounter: Payer: Self-pay | Admitting: *Deleted

## 2014-09-24 DIAGNOSIS — R1310 Dysphagia, unspecified: Secondary | ICD-10-CM | POA: Diagnosis not present

## 2014-09-24 NOTE — Progress Notes (Signed)
Remote pacemaker transmission.   

## 2014-09-28 DIAGNOSIS — M6281 Muscle weakness (generalized): Secondary | ICD-10-CM | POA: Diagnosis not present

## 2014-09-28 DIAGNOSIS — I679 Cerebrovascular disease, unspecified: Secondary | ICD-10-CM | POA: Diagnosis not present

## 2014-09-28 DIAGNOSIS — R262 Difficulty in walking, not elsewhere classified: Secondary | ICD-10-CM | POA: Diagnosis not present

## 2014-09-28 DIAGNOSIS — I639 Cerebral infarction, unspecified: Secondary | ICD-10-CM | POA: Diagnosis not present

## 2014-09-28 LAB — CUP PACEART REMOTE DEVICE CHECK
Battery Remaining Longevity: 96 mo
Battery Remaining Percentage: 81 %
Battery Voltage: 2.93 V
Brady Statistic AP VP Percent: 1 %
Brady Statistic AP VS Percent: 54 %
Brady Statistic AS VP Percent: 3.3 %
Brady Statistic AS VS Percent: 42 %
Brady Statistic RA Percent Paced: 54 %
Brady Statistic RV Percent Paced: 3.4 %
Date Time Interrogation Session: 20160728174046
Lead Channel Impedance Value: 430 Ohm
Lead Channel Impedance Value: 430 Ohm
Lead Channel Pacing Threshold Amplitude: 1 V
Lead Channel Pacing Threshold Amplitude: 1.5 V
Lead Channel Pacing Threshold Pulse Width: 0.4 ms
Lead Channel Pacing Threshold Pulse Width: 0.8 ms
Lead Channel Sensing Intrinsic Amplitude: 4.7 mV
Lead Channel Sensing Intrinsic Amplitude: 5 mV
Lead Channel Setting Pacing Amplitude: 2 V
Lead Channel Setting Pacing Amplitude: 2.5 V
Lead Channel Setting Pacing Pulse Width: 0.8 ms
Lead Channel Setting Sensing Sensitivity: 1.5 mV
Pulse Gen Model: 2110
Pulse Gen Serial Number: 2285953

## 2014-09-29 DIAGNOSIS — M6281 Muscle weakness (generalized): Secondary | ICD-10-CM | POA: Diagnosis not present

## 2014-09-29 DIAGNOSIS — I639 Cerebral infarction, unspecified: Secondary | ICD-10-CM | POA: Diagnosis not present

## 2014-09-29 DIAGNOSIS — I679 Cerebrovascular disease, unspecified: Secondary | ICD-10-CM | POA: Diagnosis not present

## 2014-09-29 DIAGNOSIS — R262 Difficulty in walking, not elsewhere classified: Secondary | ICD-10-CM | POA: Diagnosis not present

## 2014-10-01 ENCOUNTER — Other Ambulatory Visit: Payer: Self-pay | Admitting: Family Medicine

## 2014-10-01 DIAGNOSIS — M6281 Muscle weakness (generalized): Secondary | ICD-10-CM | POA: Diagnosis not present

## 2014-10-01 DIAGNOSIS — R262 Difficulty in walking, not elsewhere classified: Secondary | ICD-10-CM | POA: Diagnosis not present

## 2014-10-01 DIAGNOSIS — I639 Cerebral infarction, unspecified: Secondary | ICD-10-CM | POA: Diagnosis not present

## 2014-10-01 DIAGNOSIS — I679 Cerebrovascular disease, unspecified: Secondary | ICD-10-CM | POA: Diagnosis not present

## 2014-10-05 ENCOUNTER — Encounter: Payer: Self-pay | Admitting: Cardiology

## 2014-10-08 DIAGNOSIS — I679 Cerebrovascular disease, unspecified: Secondary | ICD-10-CM | POA: Diagnosis not present

## 2014-10-08 DIAGNOSIS — M6281 Muscle weakness (generalized): Secondary | ICD-10-CM | POA: Diagnosis not present

## 2014-10-08 DIAGNOSIS — I639 Cerebral infarction, unspecified: Secondary | ICD-10-CM | POA: Diagnosis not present

## 2014-10-08 DIAGNOSIS — R262 Difficulty in walking, not elsewhere classified: Secondary | ICD-10-CM | POA: Diagnosis not present

## 2014-10-14 ENCOUNTER — Telehealth: Payer: Self-pay | Admitting: Neurology

## 2014-10-14 NOTE — Telephone Encounter (Signed)
Sharyn Lull with [facility] called inquiring if RX for speech generating device has been received and signed. She also requested medical records. She can be reached at [phone]

## 2014-10-14 NOTE — Telephone Encounter (Signed)
Called [place] back. I told her I checked with MR and my paperwork and have not received a fax yet. She faxed to [phone] on  10/13/14 around 930am. I told her I will check again with MR. I asked her to also fax it again to [phone] with attn to [facility]. Advised Dr. Jaynee Eagles is out of the office until Friday and I will give it to her then and fax it Friday or Monday. She verbalized understanding.

## 2014-10-15 ENCOUNTER — Ambulatory Visit: Payer: Medicare Other | Admitting: Neurology

## 2014-10-15 NOTE — Telephone Encounter (Signed)
Received orders for Dr. Jaynee Eagles to sign. Will give to Dr. Jaynee Eagles.

## 2014-10-15 NOTE — Telephone Encounter (Signed)
Called [facility] again. Asked her if she faxed Rx yet and she had sent it again yesterday to original fax: [phone]. Have not received fax yet. I asked her to fax to my fax number [phone]. She verbalized understanding.

## 2014-10-16 ENCOUNTER — Other Ambulatory Visit: Payer: Self-pay | Admitting: Nurse Practitioner

## 2014-10-16 NOTE — Telephone Encounter (Signed)
Thanks emma, just give it to me on Monday to sign

## 2014-10-16 NOTE — Telephone Encounter (Signed)
[facility] [phone] called to advise wrong ICD10 code on Rx they sent earlier this week. She just sent form with corrected code on it. This is the one that needs to be signed and sent back.

## 2014-10-19 ENCOUNTER — Encounter: Payer: Self-pay | Admitting: Neurology

## 2014-10-19 ENCOUNTER — Ambulatory Visit (INDEPENDENT_AMBULATORY_CARE_PROVIDER_SITE_OTHER): Payer: Medicare Other | Admitting: Neurology

## 2014-10-19 VITALS — BP 136/82 | HR 76 | Ht 64.0 in | Wt 159.0 lb

## 2014-10-19 DIAGNOSIS — I639 Cerebral infarction, unspecified: Secondary | ICD-10-CM | POA: Diagnosis not present

## 2014-10-19 DIAGNOSIS — G40109 Localization-related (focal) (partial) symptomatic epilepsy and epileptic syndromes with simple partial seizures, not intractable, without status epilepticus: Secondary | ICD-10-CM

## 2014-10-19 MED ORDER — LEVETIRACETAM 500 MG PO TABS: ORAL_TABLET | ORAL | Status: DC

## 2014-10-19 NOTE — Patient Instructions (Signed)
Overall you are doing fairly well but I do want to suggest a few things today:   Remember to drink plenty of fluid, eat healthy meals and do not skip any meals. Try to eat protein with a every meal and eat a healthy snack such as fruit or nuts in between meals. Try to keep a regular sleep-wake schedule and try to exercise daily, particularly in the form of walking, 20-30 minutes a day, if you can.   As far as your medications are concerned, I would like to suggest: Continue current medications  As far as diagnostic testing: follow up with Dr. Erlinda Hong  I would like to see you back in 3-4 months, sooner if we need to. Please call us with any interim questions, concerns, problems, updates or refill requests.   Please also call us for any test results so we can go over those with you on the phone.  My clinical assistant and will answer any of your questions and relay your messages to me and also relay most of my messages to you.   Our phone number is [phone]. We also have an after hours call service for urgent matters and there is a physician on-call for urgent questions. For any emergencies you know to call 911 or go to the nearest emergency room

## 2014-10-19 NOTE — Progress Notes (Signed)
[facility]    Provider:  Dr Jaynee Eagles Referring Provider: Chipper Herb, MD Primary Care Physician:  Redge Gainer, MD  Provider: Dr Jaynee Eagles Referring Provider: Chevis Pretty, * Primary Care Physician: Redge Gainer, MD  CC: Stroke  Interval update 10/19/2014:  Discussed records from [place] at length, I reviewed over 700 pages of records in total of patient's inpatient, acute rehab and subacute rehab stays. Patient's husband had a lot of questions about her seizures, EEGs and subsequent treatment with antiepileptic drugs after her stroke. Discussed this in detail with him please see addendum which does show that patient had episodes of alteration and confusion, epileptiform activity and seizures confirmed on EEG. She is not having episodes of confusion since then. No episodes of staring, non-purposeful movements or staring episodes or anything concerning for ongoing partial seizures. However she says that she can't remember coming down here in May. Her long term memory is good. She can remember things that she did a long time ago. She can't remember things that happened yesterday, doesn't remember people that they talk to since she had the stroke. All these symptoms have started since the stroke. She isn't very social. She does have a long history of mood disorder. She does want to see her family and is interested in seeing her grandkids. She is not very interested in other things. She is not positive, she is frequently sad. She doesn't laugh very often. Discussed that considering her large stroke, she is at high risk for continued seizures and I recommend continued Rep she is not having side effects. We could repeat EEG and try weaning her off the [place] if they feel strongly about trying to discontinue medication. However I suggest follow up with Dr. Erlinda Hong who is a stroke specialist. Two sleep studies were negative.    Initial visit 07/15/2014: [name] is a 69  y.o. female here as a referral from Dr. Hassell Done for stroke. She has a past medical history of hypertension, diabetes, high cholesterol, atrial fibrillation, depression, anxiety, hypothyroidism, peripheral neuropathy. She sees Dr. Rayann Heman as her cardiologist and family speaks very highly of him, for paroxysmal A. Fib.. She is here with her husband and son who provide most of the information. Husband is very concerned as he feels patient's memory is affected, her mood is poor, he is unsure why she was treated for seizures in the past and whether she even has seizures. Stroke was in February 20 15th. Patient suffered a stroke during ablation for atrial fibrillation. She was taken to [facility] and was admitted for stroke. She was in rehab at [facility] after the stroke. Then she went to [facility] for rehabilitation and was diagnosed with seizures. She was seeing a neurologist in [place] and had multiple EEGs and husband is unsure if she was being treated appropriately. She has residual right-sided weakness and aphasia. Per husband and son, she has physically significantly improved since her stroke. She is in physical therapy, occupational therapy as well as speech therapy.   However the biggest concern is her mood. They feel she has depression. When patient is asked if she is depressed she nods yes. Some weeks are better than others but she has lost interest in doing activities that she liked, she appears very fatigued, disinterest in activities, sad most days. She gets frustrated with phones, she won't use a computer, she gets flustered. Her mood is variable, sometimes she is happier than other times. Motivation fluctuates. She doesn't  want to go out of the house. Son's wife spends time with patient and is worried about patient's outlook.. Family Questions on whether or not this is associated with stroke. They're very concerned and would like to help patient. She has  lost weight and has decreased appetite.   Review of Systems: Patient complains of symptoms per HPI as well as the following symptoms: Fatigue, and expected weight change, eye itching, light sensitivity, blurred vision. Pertinent negatives per HPI. All others negative.   Social History   Social History  . Marital Status: Married    Spouse Name: [name]"  . Number of Children: 2  . Years of Education: 12+   Occupational History  . ADMINISTRATION    Social History Main Topics  . Smoking status: Never Smoker   . Smokeless tobacco: Never Used  . Alcohol Use: No  . Drug Use: No  . Sexual Activity: Yes   Other Topics Concern  . Not on file   Social History Narrative   Lives in [place].     Caffeine use: drinks coffee/tea: 2 cups coffee in the morning, drinks 1 glass tea per day        Family History  Problem Relation Age of Onset  . Breast cancer Sister   . Ovarian cancer Sister   . Uterine cancer Paternal Aunt   . Crohn's disease Other     neice  . Diabetes Mother   . Heart disease Mother   . Diabetes Maternal Grandmother   . Diabetes Sister   . Heart disease Father   . Heart disease Brother   . Heart disease Brother   . Colon cancer Neg Hx   . Stomach cancer Neg Hx     Past Medical History  Diagnosis Date  . Impaired glucose tolerance   . Psoriasis   . IBS (irritable bowel syndrome)     vs diarrhea vs abd. fullness   . Chronic anxiety   . Uterine prolapse   . Bronchial spasms   . Hypertension   . Esophageal stricture   . Elevated blood sugar   . Diverticulosis   . Rectocele, female   . History of bladder repair surgery   . Hx of vaginal hysterectomy   . Vaginal prolapse 1998  . Hypothyroidism   . Psoriasis   . Tachy-brady syndrome     a. 08/19/2008 s/p PPM: SJM 2110 Accent  . Complication of anesthesia     " I shake real bad "  . Family history of anesthesia complication     Daughter also shakes while waking up  . GERD  (gastroesophageal reflux disease)   . H/O hiatal hernia   . Neuromuscular disorder     periferal neuropathy  . Anemia   . Paroxysmal atrial fibrillation     a. failed flecainide, tikosyn, amio;  b. 01/2012 s/p RFCA.  . H/O scarlet fever   . Pacemaker     [employer]    Past Surgical History  Procedure Laterality Date  . Vaginal hysterectomy      prolapse   . Bladder suspension    . Rectocele repair    . Transthoracic echocardiogram  2008  . Tee without cardioversion  02/06/2012    Procedure: TRANSESOPHAGEAL ECHOCARDIOGRAM (TEE);  Surgeon: Thayer Headings, MD;  Location: [facility] ENDOSCOPY;  Service: Cardiovascular;  Laterality: N/A;  . Atrial fibrillation ablation  02/06/12    PVI by Dr Rayann Heman  . Insert / replace / remove pacemaker  SJM  . Atrial fibrillation ablation  07/04/2012    repeat PVI by Dr Rayann Heman  . Tee without cardioversion N/A 07/04/2012    Procedure: TRANSESOPHAGEAL ECHOCARDIOGRAM (TEE);  Surgeon: Lelon Perla, MD;  Location: [facility] ENDOSCOPY;  Service: Cardiovascular;  Laterality: N/A;  . Atrial fibrillation ablation N/A 02/06/2012    Procedure: ATRIAL FIBRILLATION ABLATION;  Surgeon: Thompson Grayer, MD;  Location: [facility] CATH LAB;  Service: Cardiovascular;  Laterality: N/A;  . Atrial fibrillation ablation N/A 07/04/2012    Procedure: ATRIAL FIBRILLATION ABLATION;  Surgeon: Thompson Grayer, MD;  Location: [facility] CATH LAB;  Service: Cardiovascular;  Laterality: N/A;  . Left heart catheterization with coronary angiogram N/A 03/24/2013    Procedure: LEFT HEART CATHETERIZATION WITH CORONARY ANGIOGRAM;  Surgeon: Blane Ohara, MD;  Location: [facility] CATH LAB;  Service: Cardiovascular;  Laterality: N/A;    Current Outpatient Prescriptions  Medication Sig Dispense Refill  . acetaminophen (TYLENOL) 500 MG tablet Take 500 mg by mouth every 6 (six) hours as needed.    . Ascorbic Acid (VITAMIN C PO) Take 1 tablet by mouth daily.    Marland Kitchen bismuth subsalicylate (PEPTO BISMOL) 262 MG/15ML suspension Take  30 mLs by mouth every 4 (four) hours as needed for indigestion.    . Calcium Carbonate-Vitamin D (CALCIUM-VITAMIN D) 500-200 MG-UNIT per tablet Take 1 tablet by mouth daily.    . cholecalciferol (VITAMIN D) 1000 UNITS tablet Take 1,000 Units by mouth daily.    . citalopram (CELEXA) 20 MG tablet Take 1 tablet (20 mg total) by mouth 2 (two) times daily. 60 tablet 3  . dicyclomine (BENTYL) 10 MG capsule TAKE 1 CAPSULE BY MOUTH FOUR TIMES DAILY BEFORE MEALS AND AT BEDTIME 120 capsule 2  . fluticasone (FLONASE) 50 MCG/ACT nasal spray Place 2 sprays into both nostrils daily. 16 g 2  . levothyroxine (SYNTHROID, LEVOTHROID) 88 MCG tablet TAKE 1 TABLET BY MOUTH EVERY DAY BEFORE BREAKFAST 30 tablet 11  . levothyroxine (SYNTHROID, LEVOTHROID) 88 MCG tablet TAKE 1 TABLET BY MOUTH DAILY BEFORE BREAKFAST 30 tablet 7  . loperamide (IMODIUM) 2 MG capsule Take by mouth as needed for diarrhea or loose stools.    . methotrexate (RHEUMATREX) 2.5 MG tablet Take 4 tablets (10 mg total) by mouth once a week. Caution:Chemotherapy. Protect from light. 16 tablet 1  . metoprolol (LOPRESSOR) 50 MG tablet Take 1 tablet (50 mg total) by mouth 2 (two) times daily. 180 tablet 1  . mirtazapine (REMERON) 15 MG tablet TAKE 1 TABLET BY MOUTH EVERY DAY 90 tablet 0  . Multiple Vitamins-Minerals (EYE VITAMINS PO) Take 1 tablet by mouth daily.    . nitrofurantoin (MACRODANTIN) 100 MG capsule Take 1 capsule (100 mg total) by mouth 2 (two) times daily. 14 capsule 0  . omeprazole (PRILOSEC) 40 MG capsule TAKE ONE CAPSULE BY MOUTH EVERY DAY 30 capsule 5  . ONE TOUCH ULTRA TEST test strip TEST ONCE DAILY AND AS NEEDED 100 each 5  . potassium chloride SA (K-DUR,KLOR-CON) 20 MEQ tablet TAKE 2 TABLETS BY MOUTH TWICE DAILY 120 tablet 4  . Probiotic Product (PROBIOTIC PO) Take 1 capsule by mouth daily.    . simvastatin (ZOCOR) 40 MG tablet TAKE 1 TABLET BY MOUTH EVERY DAY 30 tablet 3  . traMADol (ULTRAM) 50 MG tablet Take 1/2 tab QID PRN for  severe pain. 28 tablet 0  . XARELTO 20 MG TABS tablet TAKE 1 TABLET BY MOUTH EVERY DAY 30 tablet 5  . levETIRAcetam (KEPPRA) 500 MG tablet Take 1  tablet (500 mg total) by mouth 2 (two) times daily. (Patient not taking: Reported on 10/19/2014) 60 tablet 3   No current facility-administered medications for this visit.    Allergies as of 10/19/2014 - Review Complete 10/19/2014  Allergen Reaction Noted  . Latex Rash 06/27/2012  . Caudal tray [lidocaine-epinephrine]  06/22/2010  . Ciprofloxacin hcl Nausea Only 06/03/2012  . Codeine Nausea Only   . Diovan [valsartan] Itching 06/22/2010  . Doxycycline Other (See Comments)   . Esomeprazole magnesium  06/22/2010  . Levofloxacin  06/22/2010  . Myrbetriq [mirabegron] Diarrhea 12/01/2013  . Neomycin-bacitracin zn-polymyx    . Penicillins    . Sulfa drugs cross reactors Other (See Comments) 06/22/2010  . Verapamil  06/22/2010  . Vimovo [naproxen-esomeprazole]  06/22/2010  . Zocor [simvastatin]  06/22/2010    Vitals: BP 136/82 mmHg  Pulse 76  Ht 5\' 4"  (1.626 m)  Wt 159 lb (72.122 kg)  BMI 27.28 kg/m2 Last Weight:  Wt Readings from Last 1 Encounters:  10/19/14 159 lb (72.122 kg)   Last Height:   Ht Readings from Last 1 Encounters:  10/19/14 5\' 4"  (1.626 m)   Speech:  Expressive aphasia, comprehension appears to be intact Cognition:  The patient is oriented to person, place, and time;   recent and remote memory do not appear to be affected   normal attention, concentration,   fund of knowledge appears grossly intact Cranial Nerves:  The pupils are equal, round, and reactive to light.  Visual fields are full to finger confrontation. Extraocular movements are intact. Trigeminal sensation is intact and the muscles of mastication are normal. Right lower facial droop. The palate elevates in the midline. Hearing intact. Voice is normal. Shoulder shrug is normal. The tongue has normal motion without fasciculations.    Coordination:  No dysmetria on the left, cannot perform on the right due to weakness  Gait:  With walker  Motor Observation:  No asymmetry, no atrophy, and no involuntary movements noted. Tone:  Normal muscle tone.   Posture:  Posture is normal. normal erect   Strength: right arm and hip flexion 4/5 and right DF 4+/5  Otherwise strength is V/V in the left upper and left lower limbs.    Sensation: intact to LT, there does not appear to be hemisensory loss, patient reports equal sensation bilaterally to touch. However difficult exam as patient is mostly nonverbal.. However I would expect hemisensory loss based on size and location of CVA.   Reflex Exam:   Toes:  Right upgoing  Clonus:  Clonus is absent.    Assessment/Plan: 69 year old patient who presents with her husband and son here for follow up of stroke with residual right-sided hemiparesis and aphasia. By account she appears to be doing very well after her stroke as far as physical therapy, occupational therapy and speech therapy are concern. However patient appears to have significant depression which is concerning for husband and son, discussed depression at length with family, this is very common after stroke. Patient is on Celexa and follows closely with Dr. Redge Gainer. They have been evaluated by primary care to ensure there are no medical causes for patient's fatigue and depression including thyroid testing, ensuring there is no anemia, or any infection or any other metabolic, toxic etiology. Highly recommended following up with therapy and psychiatry. The family appears very earnest in their concern and caring for patient, the effects of stroke on a family can be devastating. Discussed this at length with family.   I would  like patient to continue Keppra. We'll order EEG. Continue Xarelto and statin for stroke prevention. Follow closely with primary care for management of vascular risk  factors. Continue physical therapy, occupational therapy, speech therapy. Follow-up with Dr. Erlinda Hong who is a stroke specialist in our office I will request imaging from her strokes on CD, those did not come with records that I reviewed  Addendum 07/20/2014: Received extensive records.  [facility] rehabilitation consult notes: Patient was initially admitted to [facility] for electrophysiology study and radiofrequency ablation in February 2015. Unfortunately postoperatively she was noted to be weak on her right side. She was diagnosed with a left embolic MCA distribution CVA. She was also noted to have significant dysphagia and dysarthria. Resultant right-sided hemiparesis and hemisensory loss with dense expressive aphasia, dysarthria and dysphasia. She was admitted to the inpatient rehabilitation unit in 04/26/2013. Notes state that at that time of admission she had complete plegia 0/5 in the right upper extremity and right lower extremity. She was able to follow commands but was mostly nonverbal. Also reported right-sided hemisensory loss.  Per notes, patient appeared lethargic and CT of the head and EEG was performed in rehabilitation. CT on approx 04/29/2013 was compared with CT on 04/16/2013 from [facility], showed an acute left MCA infarction with increased density in the proximal left MCA likely reflecting a thrombus but could be artifactual. No acute intracranial hemorrhage. This finding again likely reflects the left MCA thrombus and now subacute left MCA infarction. EEG on 04/30/2013 showed mild encephalopathy, left cerebral hemisphere structural and/or physiologic brain damage or dysfunction secondary to recent large middle cerebral artery territory infarction, decreased threshold for seizure of bilateral cerebral hemisphere origin with focalization over the middle aspect of the temporal lobes. Right more than left. No clear evidence of active seizure. Overnight oximetry  report was performed on 04/30/2013. The patient was on room air and observed to have slept during testing, recorded over 6 hours 32 minutes, the lowest SaO2 recorded was 93%, negative study for oxygen desaturation.  GI Consult Notes from 05/08/2013 discussed watery diarrhea, C. difficile testing was negative. There is no blood or mucus in the stool. At that time she was on tube feeds, diarrhea started after Glucerna. Diarrhea most likely due to tube feed induced diarrhea. Patient was again seen on April 1 as requested for the evaluation of persistent diarrhea. 50 notes state patient had at least 3-4 loose bowel movements daily, C. difficile has been negative, no leukocytosis. Negative workup for infectious etiology. Physician discussed the possible etiologies with patient and her husband. A sigmoidoscopy was recommended to look for pseudomembranous colitis, ulcerative colitis, microscopic or collagenous colitis. Patient was put on Levaquin, metronidazole. Sigmoidoscopy showed mild diverticulosis on the left side, patchy erythema with exudate consistent with colitis in the rectosigmoid area biopsied for treatment, sensitive and spastic colon.   Patient was discharged from rehabilitation on 05/23/2013. Per discharge summary: Patient made fair progression throughout her rehabilitation hospitalization. At that time patient had passed a swallow study and was put on oral diet. Continued speech therapy and SLP was recommended. Discharge summary states that neurology started patient on Ritalin for decreased attention. Discharge summary states that she was diagnosed with colitis and started on antibiotics. She did undergo a sigmoidoscopy the day prior to her discharge. Paxil was increased several days prior to her discharge due to depression. Patient was transferred from inpatient rehabilitation to skilled nursing.    EEG performed 08/01/2013 patient was sent from her  subacute rehabilitation facility.  Impression was abnormal EEG with finding indicating left cerebral hemisphere structural and/or physiologic brain damage or dysfunction. Epileptiform activity with epileptogenic potential over the left cerebral hemisphere (phase reversal at T3) with possibility of localization over the left mid temporal lobes.  Notes from neurology and sleep clinic of [place]: Notes state that this physician first saw patient in rehabilitation on 04/29/2013. Patient was lethargic and she had an abnormal EEG on 04/30/2013. At that time physician started on Keppra and performed an overnight pulse ox (see above for results). No clinical seizures were recorded. She was started on Keppra 250 mg 3 times a day. Repeat EEG was performed on 08/01/2013 (see above for results) EEG on 10/21/2013 was abnormal awake and asleep EEG showing brief electrographic subclinical seizures and interictal discharges arising from the left frontal temporal lobes. EEG 03/10/2014 showed minimal focal epileptiform activity arising from the anterior aspect of the left frontal temporal lobe with no electrographic subclinical seizures. Physician attributed her lethargy and cognitive mentation changes as attributed to seizure activity. Keppra was increased to 500 in the morning and 750 in the evenings in October 2015.  Still pending notes from [place]: reviewed 561 pages of records. Significant details as relating to her neurologic management was noted below:  Encounter date 02/28/2013: She had 2 catheter ablation procedures(2013 and 2014) for afib on xarelto and, s/p pacemaker, antiarrhythmic medications. Still with continued afib, fatigue and palpitations. Hx of impaired glucose control, HTN, DM,chronic back pain, bronchospasm, hypothyroidism, IBS, peripheral neuropathy, anxiety, and psoriasis. Discussed combined endocardial and epicardial ablation procedure.   03/24/2013: left heart cath, selective coronary  angiography, LV angiography results show normal coronary arteries with nml left ventricular systolic function.  03/03/2013: PMHx paroxysmal afib, sinus node dysfunction and tachybradycardia syndrome s/p pacemaker in 2010. They discussed again combined endocardial and epicardial ablation procedure or as a last resort AV node ablation.  Echo 03/2013: no intracardiac thrombus, nml left ventricular EF, nml right ventricular contractile performance.   Carotid duplex 04/14/2013: Right carotid: Evidence in the ICA of a less than 40% stenocis Left carotid: evidence in the ICA of a less than 40% stenosis. Non-hemodynamically significant plaque noted in the cca. Vertebrals: patent with ategrade flow Subclavians: normal flow bilaterally  2/24/105: procedure for transabdominal laporoscopic epicardial and endocardial radiofrequency ablation of the left atrium. Notes state successful hybrid ablation procedure for paroxysmal afib. Notes state that pt begain to wake up from anesthesia and not moving right arm. Right arm no response to pain. Patient non-verbal but responding by nodding, all other extremities moving appropriately. She did start to grimace to pain on the right arm. Also noted to have right facial droop. Neurology was consulted.  Neurology note: noted right facial droop, patient following some simple commands, speech with aphasia, no movement in right arm and leg. NIHSS 25. She was not a tPA candidate. CT head stat, could not have MRI due to pacemaker. Large L MCA stroke, increased density in the proximal left MCA likely thrombus with resultant right-sided weakness and expressive aphasia with a left gaze preference.  ldl 21, hgba1c 6.2,   Neurology note 2/28: neurologically stable for 3 days and transferred back to the cicu from the [facility]. Neurologic exam was limited due to hypersomnolence. Slight gaze preference, can cross the midline, right facial droop, decreased tone in the right arm with pronator  drift, not moving r arm or leg even to noxious stim. Left side moving  spontaneously, upgoing r toe.    EGD with peg tube 04/23/2013.Marland Kitchen  She was discharged to inpatient rehab(see details above) but did not regain any movement to her right side prior to discharge.   Sarina Ill, MD  [facility] [address] [place], [place] [address]  Phone [phone] Fax [phone]  A total of 60 minutes was spent face-to-face with this patient. Over half this time was spent on counseling patient on the stroke, seizure diagnosis and different diagnostic and therapeutic options available.

## 2014-10-20 ENCOUNTER — Encounter: Payer: Self-pay | Admitting: *Deleted

## 2014-10-20 NOTE — Progress Notes (Signed)
Faxed signed order for speech device by Dr. Jaynee Eagles to [employer] [facility] at [phone]. Faxed on 10/19/14 at 5:30pm. Received fax confirmation. Copy sent to medical records.

## 2014-10-21 ENCOUNTER — Encounter: Payer: Self-pay | Admitting: Cardiology

## 2014-10-21 ENCOUNTER — Other Ambulatory Visit: Payer: Self-pay | Admitting: Family Medicine

## 2014-10-21 ENCOUNTER — Telehealth: Payer: Self-pay | Admitting: Neurology

## 2014-10-21 NOTE — Telephone Encounter (Signed)
Patient's husband is calling. He wanted to let Dr. Jaynee Eagles know that the patient has already been taking levETIRAcetam (KEPPRA) 500 MG tablet. The patient's husband had told Dr. Jaynee Eagles  she was not taking the drug. Since the patient has already been taking levETIRAcetam (KEPPRA) 500 MG tablet does she still need to have an EEG? Please call and advise. Thank you.

## 2014-10-22 NOTE — Telephone Encounter (Signed)
[name], that is fine, she doesn't need the EEG if we plan to keep her on the [place]. They can also discuss with Dr. Erlinda Hong.  Please let patient's husband know. thanks

## 2014-10-27 NOTE — Telephone Encounter (Signed)
Spoke w/ husband, [name]. Advised that Dr. Jaynee Eagles stated she does not need EEG if they plan to keep her on Keppra. They can also discuss with Dr. Erlinda Hong. He verbalized understanding.

## 2014-11-16 ENCOUNTER — Encounter: Payer: Self-pay | Admitting: *Deleted

## 2014-11-16 DIAGNOSIS — Z8673 Personal history of transient ischemic attack (TIA), and cerebral infarction without residual deficits: Secondary | ICD-10-CM | POA: Diagnosis not present

## 2014-11-16 DIAGNOSIS — L409 Psoriasis, unspecified: Secondary | ICD-10-CM | POA: Diagnosis not present

## 2014-11-16 NOTE — Progress Notes (Signed)
Faxed signed plan of care from [facility]: Margorie John. Fax: [phone]. Received fax confirmation.  Phone: [phone]

## 2014-11-17 ENCOUNTER — Other Ambulatory Visit: Payer: Self-pay | Admitting: Family Medicine

## 2014-11-18 NOTE — Telephone Encounter (Signed)
Last seen 07/17/14  DWM  Requesting 90 day supply 

## 2014-11-19 ENCOUNTER — Telehealth: Payer: Self-pay | Admitting: Family Medicine

## 2014-11-19 NOTE — Telephone Encounter (Signed)
DOS 07-17-14 was faxed to Dr. Shea Stakes

## 2014-11-19 NOTE — Telephone Encounter (Signed)
Faxed the last DOS 07-17-14 to Dr. Shea Stakes @ [phone].

## 2014-11-23 ENCOUNTER — Telehealth: Payer: Self-pay | Admitting: Family Medicine

## 2014-11-23 ENCOUNTER — Telehealth: Payer: Self-pay | Admitting: Internal Medicine

## 2014-11-23 NOTE — Telephone Encounter (Signed)
Closing this encounter, pt does not have question about going off medication for dental surgery. Question has been asked in another encounter about what antibiotic she needs after the surgery.

## 2014-11-23 NOTE — Telephone Encounter (Signed)
This patient is allergic to multiple antibiotics. Please confirm with the cardiologist which antibiotic he would prefer for prophylaxis for this patient for dental work-----after discussing with him please discuss with me we will call this prescription in for the patient and let the patient's husband know what is preferred by the cardiologist

## 2014-11-23 NOTE — Telephone Encounter (Signed)
Message left for Dr Crissie Sickles to call [facility] on Tuesday.

## 2014-11-23 NOTE — Telephone Encounter (Signed)
New message    Have Dr. Lovena Le to call Dr. Laurance Flatten on tomorrow.     1. What dental office are you calling from? No   2. What is your office phone and fax number? [phone]  3. What type of procedure is the patient having performed? Dental surgery / h/o st. Jude heart valve   4. What date is procedure scheduled? Pending   5. What is your question (ex. Antibiotics prior to procedure, holding medication-we need to know how long dentist wants pt to hold med)?  Prophylaxis

## 2014-11-23 NOTE — Telephone Encounter (Signed)
Pt knows to stop her xarelto before dental surgery this week. Orthodontist would like to know from Dr. Laurance Flatten what antibiotic should she be put on, she will definitely need one after the tooth is extracted but she is allergic to so many medications. Pt uses [facility] in [place], [place].

## 2014-11-24 ENCOUNTER — Other Ambulatory Visit: Payer: Self-pay | Admitting: *Deleted

## 2014-11-24 ENCOUNTER — Ambulatory Visit (INDEPENDENT_AMBULATORY_CARE_PROVIDER_SITE_OTHER): Payer: Medicare Other | Admitting: Diagnostic Neuroimaging

## 2014-11-24 ENCOUNTER — Ambulatory Visit (INDEPENDENT_AMBULATORY_CARE_PROVIDER_SITE_OTHER): Payer: Medicare Other | Admitting: Neurology

## 2014-11-24 ENCOUNTER — Encounter: Payer: Self-pay | Admitting: Neurology

## 2014-11-24 VITALS — BP 106/71 | HR 70 | Ht 64.0 in | Wt 164.8 lb

## 2014-11-24 DIAGNOSIS — F329 Major depressive disorder, single episode, unspecified: Secondary | ICD-10-CM | POA: Diagnosis not present

## 2014-11-24 DIAGNOSIS — I639 Cerebral infarction, unspecified: Secondary | ICD-10-CM

## 2014-11-24 DIAGNOSIS — G40109 Localization-related (focal) (partial) symptomatic epilepsy and epileptic syndromes with simple partial seizures, not intractable, without status epilepticus: Secondary | ICD-10-CM | POA: Diagnosis not present

## 2014-11-24 DIAGNOSIS — I63412 Cerebral infarction due to embolism of left middle cerebral artery: Secondary | ICD-10-CM | POA: Diagnosis not present

## 2014-11-24 DIAGNOSIS — I6930 Unspecified sequelae of cerebral infarction: Secondary | ICD-10-CM | POA: Insufficient documentation

## 2014-11-24 DIAGNOSIS — F32A Depression, unspecified: Secondary | ICD-10-CM

## 2014-11-24 MED ORDER — CLINDAMYCIN HCL 300 MG PO CAPS: 600.0000 mg | ORAL_CAPSULE | Freq: Once | ORAL | Status: DC

## 2014-11-24 NOTE — Telephone Encounter (Signed)
Spoke with Dr Lovena Le and med sent in - pt husband aware

## 2014-11-24 NOTE — Progress Notes (Signed)
pt husband aware  And med sent to [facility]

## 2014-11-24 NOTE — Progress Notes (Signed)
STROKE NEUROLOGY FOLLOW UP NOTE  NAME: [name] DOB: 27-Nov-1945  REASON FOR VISIT: stroke follow up HISTORY FROM: husband and chart  Today we had the pleasure of seeing [name] in follow-up at our Neurology Clinic. Pt was accompanied by husband.   History Summary [name] is a 69 y.o. female with a PMH of hypertension, diabetes, high cholesterol, atrial fibrillation on Xarelto, depression, anxiety, hypothyroidism, psoriasis on MTX and peripheral neuropathy. Patient suffered a stroke in 03/2013 during ablation procedure for atrial fibrillation with aphasia and right side weakness. She was taken to [facility] and was admitted for stroke. Felt stroke was embolic and she was put on Xarelto. During the admission, she was diagnosed with seizure after several EEGs. She was treated with Demetra Shiner. Per husband and son, she has physically significantly improved since her stroke after physical therapy, occupational therapy as well as speech therapy.  Recently, she followed up with Dr. Amado Coe for mood, depression and seizure. She was continued on keppra and ordered EEG study.    Interval History During the interval time, the patient has been doing well. No   significant changes, still has aphasia and right-sided hemiparesis. She is on right shoulder sling. Had EEG done this afternoon. Blood pressure 106/71.  REVIEW OF SYSTEMS: Full 14 system review of systems performed and notable only for those listed below and in HPI above, all others are negative:  Constitutional:  Appetite change, fatigue Cardiovascular:  Ear/Nose/Throat:  Ringing years, runny nose, trouble swallowing Skin:  Eyes:  Eye itching, blurry vision Respiratory:  Wheezing, SOB, choking Gastroitestinal:  Constipation Genitourinary:  Hematology/Lymphatic:  Bruise easily Endocrine:  Excessive eating Musculoskeletal:   Allergy/Immunology:   Neurological:   Psychiatric:  Sleep:   The following  represents the patient's updated allergies and side effects list: Allergies  Allergen Reactions  . Latex Rash  . Caudal Tray [Lidocaine-Epinephrine]     Edema    . Ciprofloxacin Hcl Nausea Only  . Codeine Nausea Only  . Diovan [Valsartan] Itching  . Doxycycline Other (See Comments)    Might have caused heart to race per pt - not sure  . Esomeprazole Magnesium     Headache    . Levofloxacin     Insomnia    . Myrbetriq [Mirabegron] Diarrhea  . Neomycin-Bacitracin Zn-Polymyx     rash  . Penicillins     hives  . Sulfa Drugs Cross Reactors Other (See Comments)    Pt not sure what reaction was  . Verapamil     Edema   . Vimovo [Naproxen-Esomeprazole]     Upset stomach   . Zocor [Simvastatin]     Edema, and myalagias     The neurologically relevant items on the patient's problem list were reviewed on today's visit.  Neurologic Examination  A problem focused neurological exam (12 or more points of the single system neurologic examination, vital signs counts as 1 point, cranial nerves count for 8 points) was performed.  Blood pressure 106/71, pulse 70, height 5\' 4"  (1.626 m), weight 164 lb 12.8 oz (74.753 kg).  General - Well nourished, well developed, in no apparent distress.  Ophthalmologic - Fundi not visualized due to  noncooperation.  Cardiovascular - irregularly irregular heart rate and rhythm.  Mental Status -  Awake alert, follows simple commands, able to say several words but not a sentence. Not able to name or repeat.  Cranial Nerves II - XII - II - Visual field  intact OU. III, IV, VI - Extraocular movements intact. V - Facial sensation intact bilaterally. VII - right mild facial droop.Marland Kitchen VIII - Hearing & vestibular intact bilaterally. X - Palate elevates symmetrically. XI - Chin turning & shoulder shrug intact bilaterally. XII - Tongue protrusion intact.  Motor Strength - The patient's strength was 3/5 RUE proximal and 3/5 bicep and tricep, RLE 5-/5.  Bulk was normal and fasciculations were absent.   Motor Tone - Muscle tone was assessed at the neck and appendages and was normal.  Reflexes - The patient's reflexes were 1+ in all extremities and she had no pathological reflexes.  Sensory - Light touch, temperature/pinprick were assessed and were decreased on the right.     Coordination - The patient had normal movements in the left hand and feet with no ataxia or dysmetria.  Tremor was absent.  Gait and Station - walking with cane, subtle right hemiparetic gait. .  Data reviewed: I personally reviewed the images and agree with the radiology interpretations.  Echo 03/2013: no intracardiac thrombus, nml left ventricular EF, nml right ventricular contractile performance.   Carotid duplex 04/14/2013: Right carotid: Evidence in the ICA of a less than 40% stenocis Left carotid: evidence in the ICA of a less than 40% stenosis. Non-hemodynamically significant plaque noted in the cca. Vertebrals: patent with ategrade flow Subclavians: normal flow bilaterally  EEG 08/01/2013. Impression was abnormal EEG with finding indicating left cerebral hemisphere structural and/or physiologic brain damage or dysfunction. Epileptiform activity with epileptogenic potential over the left cerebral hemisphere (phase reversal at T3) with possibility of localization over the left mid temporal lobes.  EEG on 04/30/2013. At that time physician started on Keppra and performed an overnight pulse ox (see above for results). No clinical seizures were recorded. She was started on Keppra 250 mg 3 times a day.   EEG on 10/21/2013 was abnormal awake and asleep EEG showing brief electrographic subclinical seizures and interictal discharges arising from the left frontal temporal lobes.   EEG 03/10/2014 showed minimal focal epileptiform activity arising from the anterior aspect of the left frontal temporal lobe with no electrographic subclinical seizures. Keppra was increased to  500 in the morning and 750 in the evenings in October 2015.  LDL 21 and A1C 6.2   Assessment: As you may recall, she is a 69 y.o. Caucasian female with PMH of hypertension, diabetes, high cholesterol, atrial fibrillation on Xarelto, depression, anxiety, hypothyroidism, psoriasis on MTX and peripheral neuropathy. Patient suffered a stroke in 03/2013 during ablation procedure due to atrial fibrillation with aphasia and right side weakness. She was taken to [facility] and was admitted for stroke. Felt stroke was embolic and she was put on Xarelto. During the admission, she was diagnosed with seizure after several EEGs. She was treated with Demetra Shiner. She has already 1.5 years out of stroke, it is likely that her continued neuro improvement would be very limited. No imaging needed this time. But consider to check it next visit.   Plan:   - continue Xarelto and zocor for stroke prevention - continue speech therapy  - continue celexa for depression - Follow up with your primary care physician for stroke risk factor modification. Recommend maintain blood pressure goal <130/80, diabetes with hemoglobin A1c goal below 6.5% and lipids with LDL cholesterol goal below 70 mg/dL.  - check BP at home  - continue follow up with cardiology - follow up in 3-4 months  No orders of the defined types were placed in  this encounter.    No orders of the defined types were placed in this encounter.    Patient Instructions  - continue Xarelto and zocor for stroke prevention - continue to work with speech specialist - adjust yourself to the deficit  - continue celexa for depression - Follow up with your primary care physician for stroke risk factor modification. Recommend maintain blood pressure goal <130/80, diabetes with hemoglobin A1c goal below 6.5% and lipids with LDL cholesterol goal below 70 mg/dL.  - check BP at home  - continue follow up with cardiology - follow up in 3-4  months    Rosalin Hawking, MD PhD [facility] [address] [place], [place] [address] [phone]

## 2014-11-24 NOTE — Patient Instructions (Signed)
-   continue Xarelto and zocor for stroke prevention - continue to work with speech specialist - adjust yourself to the deficit  - continue celexa for depression - Follow up with your primary care physician for stroke risk factor modification. Recommend maintain blood pressure goal <130/80, diabetes with hemoglobin A1c goal below 6.5% and lipids with LDL cholesterol goal below 70 mg/dL.  - check BP at home  - continue follow up with cardiology - follow up in 3-4 months

## 2014-11-24 NOTE — Telephone Encounter (Signed)
Dr Lovena Le talked with Dr Laurance Flatten no need for antibiotic but if he does Dr Lovena Le recommended Clindamycin

## 2014-11-25 ENCOUNTER — Telehealth: Payer: Self-pay | Admitting: Family Medicine

## 2014-11-25 NOTE — Telephone Encounter (Signed)
Daughter called questions answered questions - she may call cardio to discuss holding xorelto

## 2014-11-25 NOTE — Progress Notes (Signed)
Thank you Dr Erlinda Hong

## 2014-11-27 NOTE — Procedures (Signed)
   [facility]  EEG (ELECTROENCEPHALOGRAM) REPORT   STUDY DATE: 11/27/14 PATIENT NAME: [name] DOB: [birth year] MRN: [id number]  ORDERING CLINICIAN: Rosalin Hawking, MD PhD   TECHNOLOGIST: Laretta Alstrom  TECHNIQUE: Electroencephalogram was recorded utilizing standard 10-20 system of lead placement and reformatted into average and bipolar montages.  RECORDING TIME: 27 minutes ACTIVATION: photic stimulation  CLINICAL INFORMATION: 69 year old female with history of left MCA infarct. Evaluate for seizure activity.  FINDINGS: Background rhythms of 6-7 hertz and 15-20 microvolts over the left hemisphere and 8-9 hertz and 20-25 microvolts over the right hemisphere. Frequent left frontal and temporal sharp waves noted, particularly over F7 and T3 electrodes. Phase reversals noted over the F7/T3 region. Some of the sharp waves are followed by slow wave depolarizations. No electrographic seizures are seen. Patient recorded in the awake state. EKG channel shows regular rhythm of 72 beats per minute.  IMPRESSION:  Abnormal EEG in awake state demonstrating: 1. Frequent left fronto-temporal sharp waves noted over F7 and T3 electrodes. 2. Mild left hemispheric slowing in the 6-7 Hz range. 3. No electrographic seizures are seen.    INTERPRETING PHYSICIAN:  Penni Bombard, MD Certified in Neurology, Neurophysiology and Neuroimaging  [facility] [address] [place], [place] [address] [phone]

## 2014-11-28 ENCOUNTER — Other Ambulatory Visit: Payer: Self-pay | Admitting: Family Medicine

## 2014-12-01 ENCOUNTER — Telehealth: Payer: Self-pay | Admitting: Family Medicine

## 2014-12-01 ENCOUNTER — Telehealth: Payer: Self-pay | Admitting: *Deleted

## 2014-12-01 NOTE — Telephone Encounter (Signed)
I have spoken with Mr. Zagal this morning, and per Dr. Lavell Anchors, have advised that EEG was abnormal, as it has been in the past, that although no sz. activity was noted, she still has abnormalities in the area of her stroke, which lower her sz. thershold, and therefore she should continue her sz. meds.  Mr. Ehle verbalized understanding of same/fim

## 2014-12-01 NOTE — Telephone Encounter (Signed)
-----   Message from Melvenia Beam, MD sent at 11/30/2014  5:49 PM EDT ----- [name] - Let them know the EEG was abnormal, similar to the way it has been in the past. Although no seizures seen, there is abnormal electrical activity over the area of the stroke which has a lowered threshold for seizure. She should stay on the anti-epileptic medication to prevent seizures. thanks

## 2014-12-01 NOTE — Telephone Encounter (Signed)
Patient stated she will wait to see provider to see what she needs to do regarding antibiotic. She has had diarrhea once today 10/11 since starting clindamycin friday

## 2014-12-02 ENCOUNTER — Encounter: Payer: Self-pay | Admitting: Family Medicine

## 2014-12-02 ENCOUNTER — Ambulatory Visit (INDEPENDENT_AMBULATORY_CARE_PROVIDER_SITE_OTHER): Payer: Medicare Other | Admitting: Family Medicine

## 2014-12-02 VITALS — BP 103/68 | HR 81 | Temp 96.7°F | Ht 64.0 in | Wt 165.0 lb

## 2014-12-02 DIAGNOSIS — E785 Hyperlipidemia, unspecified: Secondary | ICD-10-CM

## 2014-12-02 DIAGNOSIS — I6349 Cerebral infarction due to embolism of other cerebral artery: Secondary | ICD-10-CM

## 2014-12-02 DIAGNOSIS — F324 Major depressive disorder, single episode, in partial remission: Secondary | ICD-10-CM

## 2014-12-02 DIAGNOSIS — E1140 Type 2 diabetes mellitus with diabetic neuropathy, unspecified: Secondary | ICD-10-CM | POA: Diagnosis not present

## 2014-12-02 DIAGNOSIS — I10 Essential (primary) hypertension: Secondary | ICD-10-CM | POA: Diagnosis not present

## 2014-12-02 DIAGNOSIS — I638 Other cerebral infarction: Secondary | ICD-10-CM | POA: Diagnosis not present

## 2014-12-02 DIAGNOSIS — Z23 Encounter for immunization: Secondary | ICD-10-CM | POA: Diagnosis not present

## 2014-12-02 DIAGNOSIS — K439 Ventral hernia without obstruction or gangrene: Secondary | ICD-10-CM | POA: Diagnosis not present

## 2014-12-02 DIAGNOSIS — E559 Vitamin D deficiency, unspecified: Secondary | ICD-10-CM

## 2014-12-02 DIAGNOSIS — I6389 Other cerebral infarction: Secondary | ICD-10-CM

## 2014-12-02 DIAGNOSIS — I480 Paroxysmal atrial fibrillation: Secondary | ICD-10-CM

## 2014-12-02 DIAGNOSIS — E039 Hypothyroidism, unspecified: Secondary | ICD-10-CM | POA: Diagnosis not present

## 2014-12-02 DIAGNOSIS — R300 Dysuria: Secondary | ICD-10-CM

## 2014-12-02 DIAGNOSIS — I639 Cerebral infarction, unspecified: Secondary | ICD-10-CM | POA: Diagnosis not present

## 2014-12-02 LAB — POCT URINALYSIS DIPSTICK
Blood, UA: NEGATIVE
Glucose, UA: NEGATIVE
Ketones, UA: NEGATIVE
Protein, UA: NEGATIVE
Spec Grav, UA: >=1.03
Urobilinogen, UA: NEGATIVE
pH, UA: 6

## 2014-12-02 LAB — POCT UA - MICROSCOPIC ONLY
Casts, Ur, LPF, POC: NEGATIVE
Crystals, Ur, HPF, POC: NEGATIVE
Mucus, UA: NEGATIVE
Yeast, UA: NEGATIVE

## 2014-12-02 LAB — POCT GLYCOSYLATED HEMOGLOBIN (HGB A1C): Hemoglobin A1C: 6.1

## 2014-12-02 NOTE — Addendum Note (Signed)
Addended by: Thana Ates on: 12/02/2014 04:26 PM   Modules accepted: Orders

## 2014-12-02 NOTE — Progress Notes (Signed)
Subjective:    Patient ID: [name], female    DOB: 1946/02/01, 69 y.o.   MRN: [id number]  HPI Patient is here today for a 4 month follow up. She also has a knot on the left side of her abdomen that she would like you to look at. She also has a brown spot on her face that she would like looked at also. This patient has a long and complicated history with atrial fibrillation CVA hypertension and impaired glucose tolerance. She was hospitalized for a long period of time because of the CVA from her atrial fibrillation. Her recovery has been slow but it has been progressive and she is to be commended for her efforts at her recovery and so is her husband. The patient's husband comes with her to the visit today. The biggest complaint she has today is a knot on the left side of her abdomen and then area on her face. She brings in blood pressures for review and all of these appear to be excellent. Her blood sugars are running anywhere from the 116 level up to as high as 214 on 1 occasion but the majority of her eyes are running in the 1:30 to 140 range. The patient denies chest pain shortness of breath trouble swallowing heartburn indigestion and nausea and vomiting diarrhea or blood in the stool. She is currently passing her water without problems. She is not getting any occupational therapy and physical therapy or voice or speech therapy currently. She is getting some exercise with the encouragement of her daughter and husband.    Review of Systems  Constitutional: Negative.   HENT: Negative.   Eyes: Negative.   Respiratory: Negative.   Cardiovascular: Negative.   Gastrointestinal: Negative.   Endocrine: Negative.   Genitourinary: Negative.   Musculoskeletal: Negative.   Skin:       Has knot of left side of abdomen   Allergic/Immunologic: Negative.   Neurological: Positive for speech difficulty.  Hematological: Negative.   Psychiatric/Behavioral: Negative.          Patient Active  Problem List   Diagnosis Date Noted  . Cerebrovascular accident (CVA) due to embolism of left middle cerebral artery ([place]) 11/24/2014  . Focal and partial seizures ([place]) 11/24/2014  . Depression 11/24/2014  . Cerebral infarction due to embolism of other cerebral artery ([place]) 09/03/2013  . Metabolic syndrome 22/97/9892  . High risk medication use 03/13/2013  . Fever, unspecified 06/05/2012  . Generalized abdominal pain 06/05/2012  . PAF (paroxysmal atrial fibrillation) ([place]) 02/07/2012  . Tachy-brady syndrome ([place])   . Impaired glucose tolerance   . Fatigue 12/22/2009  . SNORING 12/22/2009  . OTHER CHRONIC NONALCOHOLIC LIVER DISEASE 11/94/1740  . NONSPEC ELEVATION OF LEVELS OF TRANSAMINASE/LDH 03/09/2009  . PPM-St.Jude 09/01/2008  . BRADYCARDIA 08/13/2008  . HYPOTHYROIDISM 06/09/2008  . HTN (hypertension) 06/09/2008  . Atrial fibrillation ([place]) 06/09/2008  . PREMATURE VENTRICULAR CONTRACTIONS 06/09/2008  . IRRITABLE BOWEL SYNDROME 06/09/2008  . PSORIASIS 06/09/2008  . EDEMA 06/09/2008  . DIVERTICULOSIS, COLON, HX OF 06/09/2008   Outpatient Encounter Prescriptions as of 12/02/2014  Medication Sig  . acetaminophen (TYLENOL) 500 MG tablet Take 500 mg by mouth every 6 (six) hours as needed.  . Ascorbic Acid (VITAMIN C PO) Take 1 tablet by mouth daily.  Marland Kitchen bismuth subsalicylate (PEPTO BISMOL) 262 MG/15ML suspension Take 30 mLs by mouth every 4 (four) hours as needed for indigestion.  . Calcium Carbonate-Vitamin D (CALCIUM-VITAMIN D) 500-200 MG-UNIT per tablet Take 1 tablet  by mouth daily.  . cholecalciferol (VITAMIN D) 1000 UNITS tablet Take 1,000 Units by mouth daily.  . citalopram (CELEXA) 20 MG tablet Take 1 tablet (20 mg total) by mouth 2 (two) times daily.  . clindamycin (CLEOCIN) 300 MG capsule Take 2 capsules (600 mg total) by mouth once. 2 hours prior to procedure.  . dicyclomine (BENTYL) 10 MG capsule TAKE 1 CAPSULE BY MOUTH FOUR TIMES DAILY BEFORE MEALS AND AT BEDTIME  .  fluticasone (FLONASE) 50 MCG/ACT nasal spray Place 2 sprays into both nostrils daily.  Marland Kitchen levETIRAcetam (KEPPRA) 500 MG tablet TAKE 1 TABLET BY MOUTH TWICE DAILY  . levothyroxine (SYNTHROID, LEVOTHROID) 88 MCG tablet TAKE 1 TABLET BY MOUTH EVERY DAY BEFORE BREAKFAST  . loperamide (IMODIUM) 2 MG capsule Take by mouth as needed for diarrhea or loose stools.  . methotrexate (RHEUMATREX) 2.5 MG tablet Take 4 tablets (10 mg total) by mouth once a week. Caution:Chemotherapy. Protect from light. (Patient taking differently: Take 3 tablets (10 mg total) by mouth once a week. Caution:Chemotherapy. Protect from light.)  . metoprolol (LOPRESSOR) 50 MG tablet Take 1 tablet (50 mg total) by mouth 2 (two) times daily.  . mirtazapine (REMERON) 15 MG tablet TAKE 1 TABLET BY MOUTH EVERY DAY  . Multiple Vitamins-Minerals (EYE VITAMINS PO) Take 2 tablets by mouth daily.   Marland Kitchen omeprazole (PRILOSEC) 40 MG capsule TAKE ONE CAPSULE BY MOUTH EVERY DAY  . ONE TOUCH ULTRA TEST test strip TEST ONCE DAILY AND AS NEEDED  . potassium chloride 20 MEQ/15ML (10%) SOLN GIVE 15 ML (20 MEQ) EVERY DAY PER FEEDING TUBE.  . potassium chloride SA (K-DUR,KLOR-CON) 20 MEQ tablet TAKE 2 TABLETS BY MOUTH TWICE DAILY  . Probiotic Product (PROBIOTIC PO) Take 1 capsule by mouth daily.  . simvastatin (ZOCOR) 40 MG tablet TAKE 1 TABLET BY MOUTH EVERY DAY (Patient taking differently: TAKE 1/2 TABLET BY MOUTH EVERY DAY)  . traMADol (ULTRAM) 50 MG tablet Take 1/2 tab QID PRN for severe pain.  Marland Kitchen XARELTO 20 MG TABS tablet TAKE 1 TABLET BY MOUTH EVERY DAY  . [DISCONTINUED] levothyroxine (SYNTHROID, LEVOTHROID) 88 MCG tablet TAKE 1 TABLET BY MOUTH DAILY BEFORE BREAKFAST  . [DISCONTINUED] nitrofurantoin (MACRODANTIN) 100 MG capsule Take 1 capsule (100 mg total) by mouth 2 (two) times daily. (Patient not taking: Reported on 12/02/2014)   No facility-administered encounter medications on file as of 12/02/2014.      Objective:   Physical Exam    Constitutional: She is oriented to person, place, and time. She appears well-developed and well-nourished. No distress.  HENT:  Head: Normocephalic and atraumatic.  Right Ear: External ear normal.  Left Ear: External ear normal.  Nose: Nose normal.  Mouth/Throat: Oropharynx is clear and moist.  Eyes: Conjunctivae and EOM are normal. Pupils are equal, round, and reactive to light. Right eye exhibits no discharge. Left eye exhibits no discharge. No scleral icterus.  Neck: Normal range of motion. Neck supple. No thyromegaly present.  Cardiovascular: Normal rate, regular rhythm, normal heart sounds and intact distal pulses.   No murmur heard. The heart has a regular rhythm today at 60/m  Pulmonary/Chest: Effort normal and breath sounds normal. No respiratory distress. She has no wheezes. She has no rales. She exhibits no tenderness.  Clear anteriorly and posteriorly  Abdominal: Soft. Bowel sounds are normal. She exhibits no mass. There is no tenderness. There is no rebound and no guarding.  There is a ventral hernia above her umbilicus much more prominent with standing than with  sitting. There is no abdominal tenderness otherwise and no enlargement of the liver or spleen and there is no suprapubic tenderness and there are no bruits.  Musculoskeletal: She exhibits no edema or tenderness.  The patient is using a cane for ambulation and this is improved from the previous visits.  Lymphadenopathy:    She has no cervical adenopathy.  Neurological: She is alert and oriented to person, place, and time. No cranial nerve deficit.  Skin: Skin is warm and dry. No rash noted.  Psychiatric: She has a normal mood and affect. Her behavior is normal. Judgment and thought content normal.  The patient's mood remains positive despite all of the problems that she has had to endure over the past year and a half.  Nursing note and vitals reviewed.   BP 103/68 mmHg  Pulse 81  Temp(Src) 96.7 F (35.9 C) (Oral)   Ht 5\' 4"  (1.626 m)  Wt 165 lb (74.844 kg)  BMI 28.31 kg/m2       Assessment & Plan:  1. Type 2 diabetes mellitus with diabetic neuropathy, without long-term current use of insulin ([place]) -The patient will continue with her current treatment and with as aggressive therapeutic lifestyle changes as possible--the current treatment being therapeutic lifestyle changes.  2. Vitamin D deficiency -Continue vitamin D pending results of lab work  3. Hyperlipidemia -Continue simvastatin pending results of lab work  4. Essential hypertension -Blood pressures are good today and she will continue with current treatment  5. PAF (paroxysmal atrial fibrillation) ([place]) -Continue follow-up with cardiology every 6 months.  6. Cerebral infarction due to other mechanism [facility]) -Continue follow-up with neurology every 3 months as planned  7. Cerebral infarction due to embolism of other cerebral artery ([facility]) -Continue follow-up with neurology and continue physical therapy that his self-directed to continue to gain strength  8. Major depressive disorder with single episode, in partial remission ([place]) -Continue with Celexa  9. Ventral hernia without obstruction or gangrene -Appointment with general surgery to evaluate ventral hernia - Ambulatory referral to General Surgery  No orders of the defined types were placed in this encounter.   Patient Instructions  The patient should continue to follow-up with her neurologist She should continue to follow-up with her cardiologist She should continue to follow-up with her dermatologist We will make her an appointment to be seen by a general surgeon because of the ventral hernia that she has said that if she has problems with this she has a group that is aware of her. She should remember that the clindamycin caused her to have loose bowel movements   Arrie Senate MD

## 2014-12-02 NOTE — Addendum Note (Signed)
Addended by: Pollyann Kennedy F on: 12/02/2014 04:47 PM   Modules accepted: Orders

## 2014-12-02 NOTE — Patient Instructions (Signed)
The patient should continue to follow-up with her neurologist She should continue to follow-up with her cardiologist She should continue to follow-up with her dermatologist We will make her an appointment to be seen by a general surgeon because of the ventral hernia that she has said that if she has problems with this she has a group that is aware of her. She should remember that the clindamycin caused her to have loose bowel movements

## 2014-12-03 ENCOUNTER — Ambulatory Visit: Payer: Medicare Other | Admitting: Family Medicine

## 2014-12-03 LAB — THYROID PANEL WITH TSH
Free Thyroxine Index: 2.1 (ref 1.2–4.9)
T3 Uptake Ratio: 20 % — ABNORMAL LOW (ref 24–39)
T4, Total: 10.7 ug/dL (ref 4.5–12.0)
TSH: 4.3 u[IU]/mL (ref 0.450–4.500)

## 2014-12-03 LAB — HEPATIC FUNCTION PANEL
ALT: 13 IU/L (ref 0–32)
AST: 23 IU/L (ref 0–40)
Albumin: 4.1 g/dL (ref 3.6–4.8)
Alkaline Phosphatase: 77 IU/L (ref 39–117)
Bilirubin Total: <0.2 mg/dL (ref 0.0–1.2)
Bilirubin, Direct: 0.08 mg/dL (ref 0.00–0.40)
Total Protein: 6.8 g/dL (ref 6.0–8.5)

## 2014-12-03 LAB — LIPID PANEL
Chol/HDL Ratio: 3.5 ratio units (ref 0.0–4.4)
Cholesterol, Total: 145 mg/dL (ref 100–199)
HDL: 41 mg/dL (ref >39)
LDL Calculated: 66 mg/dL (ref 0–99)
Triglycerides: 192 mg/dL — ABNORMAL HIGH (ref 0–149)
VLDL Cholesterol Cal: 38 mg/dL (ref 5–40)

## 2014-12-03 LAB — BMP8+EGFR
BUN/Creatinine Ratio: 25 (ref 11–26)
BUN: 13 mg/dL (ref 8–27)
CO2: 29 mmol/L (ref 18–29)
Calcium: 9.4 mg/dL (ref 8.7–10.3)
Chloride: 102 mmol/L (ref 97–108)
Creatinine, Ser: 0.53 mg/dL — ABNORMAL LOW (ref 0.57–1.00)
GFR calc Af Amer: 114 mL/min/{1.73_m2} (ref >59)
GFR calc non Af Amer: 99 mL/min/{1.73_m2} (ref >59)
Glucose: 70 mg/dL (ref 65–99)
Potassium: 4.6 mmol/L (ref 3.5–5.2)
Sodium: 144 mmol/L (ref 134–144)

## 2014-12-04 ENCOUNTER — Other Ambulatory Visit: Payer: Self-pay | Admitting: *Deleted

## 2014-12-04 DIAGNOSIS — N390 Urinary tract infection, site not specified: Secondary | ICD-10-CM

## 2014-12-04 LAB — URINE CULTURE

## 2014-12-05 ENCOUNTER — Other Ambulatory Visit: Payer: Medicare Other

## 2014-12-05 DIAGNOSIS — N390 Urinary tract infection, site not specified: Secondary | ICD-10-CM | POA: Diagnosis not present

## 2014-12-07 ENCOUNTER — Telehealth: Payer: Self-pay | Admitting: Family Medicine

## 2014-12-08 LAB — URINE CULTURE

## 2014-12-11 ENCOUNTER — Other Ambulatory Visit: Payer: Self-pay | Admitting: Family Medicine

## 2014-12-14 ENCOUNTER — Encounter: Payer: Medicare Other | Admitting: Internal Medicine

## 2014-12-16 ENCOUNTER — Encounter: Payer: Medicare Other | Admitting: Internal Medicine

## 2014-12-17 ENCOUNTER — Other Ambulatory Visit: Payer: Self-pay | Admitting: Family Medicine

## 2014-12-18 NOTE — Telephone Encounter (Signed)
Error

## 2014-12-25 ENCOUNTER — Other Ambulatory Visit: Payer: Self-pay | Admitting: Surgery

## 2014-12-25 DIAGNOSIS — K439 Ventral hernia without obstruction or gangrene: Secondary | ICD-10-CM | POA: Diagnosis not present

## 2014-12-26 ENCOUNTER — Other Ambulatory Visit: Payer: Self-pay | Admitting: Family Medicine

## 2014-12-28 ENCOUNTER — Encounter: Payer: Self-pay | Admitting: Internal Medicine

## 2014-12-28 ENCOUNTER — Ambulatory Visit (INDEPENDENT_AMBULATORY_CARE_PROVIDER_SITE_OTHER): Payer: Medicare Other | Admitting: Internal Medicine

## 2014-12-28 VITALS — BP 120/74 | HR 73 | Ht 64.0 in | Wt 170.6 lb

## 2014-12-28 DIAGNOSIS — Z950 Presence of cardiac pacemaker: Secondary | ICD-10-CM

## 2014-12-28 DIAGNOSIS — I480 Paroxysmal atrial fibrillation: Secondary | ICD-10-CM

## 2014-12-28 DIAGNOSIS — I495 Sick sinus syndrome: Secondary | ICD-10-CM

## 2014-12-28 DIAGNOSIS — I639 Cerebral infarction, unspecified: Secondary | ICD-10-CM | POA: Diagnosis not present

## 2014-12-28 LAB — CUP PACEART INCLINIC DEVICE CHECK
Battery Remaining Longevity: 112.8
Battery Voltage: 2.93 V
Brady Statistic RA Percent Paced: 50 %
Brady Statistic RV Percent Paced: 3.3 %
Date Time Interrogation Session: 20161107171333
Implantable Lead Implant Date: 20100630
Implantable Lead Implant Date: 20100630
Implantable Lead Location: 753859
Implantable Lead Location: 753860
Lead Channel Impedance Value: 437.5 Ohm
Lead Channel Impedance Value: 450 Ohm
Lead Channel Pacing Threshold Amplitude: 0.75 V
Lead Channel Pacing Threshold Amplitude: 1.25 V
Lead Channel Pacing Threshold Pulse Width: 0.4 ms
Lead Channel Pacing Threshold Pulse Width: 0.8 ms
Lead Channel Sensing Intrinsic Amplitude: 5 mV
Lead Channel Sensing Intrinsic Amplitude: 5.5 mV
Lead Channel Setting Pacing Amplitude: 2 V
Lead Channel Setting Pacing Amplitude: 2.5 V
Lead Channel Setting Pacing Pulse Width: 0.8 ms
Lead Channel Setting Sensing Sensitivity: 1.5 mV
Pulse Gen Model: 2110
Pulse Gen Serial Number: 2285953

## 2014-12-28 NOTE — Progress Notes (Signed)
PCP: Redge Gainer, MD Primary Cardiologist:  Dr Criss Rosales is a 69 y.o. female who presents today for routine electrophysiology followup.  She has had a large stroke as complication from her convergent afib ablation procedure at [facility] 2/15.  She continues to make very slow recovery.  She is now walking with assistance of a walker.  She is maintaining sinus rhythm. Today, she denies symptoms of chest pain, shortness of breath, dizziness, presyncope, or syncope.    The patient is otherwise without complaint today.   Past Medical History  Diagnosis Date  . Impaired glucose tolerance   . Psoriasis   . IBS (irritable bowel syndrome)     vs diarrhea vs abd. fullness   . Chronic anxiety   . Uterine prolapse   . Bronchial spasms   . Hypertension   . Esophageal stricture   . Elevated blood sugar   . Diverticulosis   . Rectocele, female   . History of bladder repair surgery   . Hx of vaginal hysterectomy   . Vaginal prolapse 1998  . Hypothyroidism   . Psoriasis   . Tachy-brady syndrome ([place])     a. 08/19/2008 s/p PPM: SJM 2110 Accent  . Complication of anesthesia     " I shake real bad "  . Family history of anesthesia complication     Daughter also shakes while waking up  . GERD (gastroesophageal reflux disease)   . H/O hiatal hernia   . Neuromuscular disorder ([place])     periferal neuropathy  . Anemia   . Paroxysmal atrial fibrillation ([facility])     a. failed flecainide, tikosyn, amio;  b. 01/2012 s/p RFCA.  . H/O scarlet fever   . Pacemaker     [employer]. Jude   Past Surgical History  Procedure Laterality Date  . Vaginal hysterectomy      prolapse   . Bladder suspension    . Rectocele repair    . Transthoracic echocardiogram  2008  . Tee without cardioversion  02/06/2012    Procedure: TRANSESOPHAGEAL ECHOCARDIOGRAM (TEE);  Surgeon: Thayer Headings, MD;  Location: [facility] ENDOSCOPY;  Service: Cardiovascular;  Laterality: N/A;  . Atrial fibrillation ablation  02/06/12    PVI by Dr  Rayann Heman  . Insert / replace / remove pacemaker      SJM  . Atrial fibrillation ablation  07/04/2012    repeat PVI by Dr Rayann Heman  . Tee without cardioversion N/A 07/04/2012    Procedure: TRANSESOPHAGEAL ECHOCARDIOGRAM (TEE);  Surgeon: Lelon Perla, MD;  Location: [facility] ENDOSCOPY;  Service: Cardiovascular;  Laterality: N/A;  . Atrial fibrillation ablation N/A 02/06/2012    Procedure: ATRIAL FIBRILLATION ABLATION;  Surgeon: Thompson Grayer, MD;  Location: [facility] CATH LAB;  Service: Cardiovascular;  Laterality: N/A;  . Atrial fibrillation ablation N/A 07/04/2012    Procedure: ATRIAL FIBRILLATION ABLATION;  Surgeon: Thompson Grayer, MD;  Location: [facility] CATH LAB;  Service: Cardiovascular;  Laterality: N/A;  . Left heart catheterization with coronary angiogram N/A 03/24/2013    Procedure: LEFT HEART CATHETERIZATION WITH CORONARY ANGIOGRAM;  Surgeon: Blane Ohara, MD;  Location: [facility] CATH LAB;  Service: Cardiovascular;  Laterality: N/A;   ROS- all systems reviewed- see ROS sheet  Current Outpatient Prescriptions  Medication Sig Dispense Refill  . acetaminophen (TYLENOL) 500 MG tablet Take 500 mg by mouth every 6 (six) hours as needed (pain).     . Ascorbic Acid (VITAMIN C PO) Take 1 tablet by mouth daily.    Marland Kitchen bismuth  subsalicylate (PEPTO BISMOL) 262 MG/15ML suspension Take 30 mLs by mouth every 4 (four) hours as needed for indigestion.    . Calcium Carbonate-Vitamin D (CALCIUM-VITAMIN D) 500-200 MG-UNIT per tablet Take 1 tablet by mouth daily.    . cholecalciferol (VITAMIN D) 1000 UNITS tablet Take 1,000 Units by mouth daily.    . citalopram (CELEXA) 20 MG tablet TAKE 1 TABLET(20 MG) BY MOUTH TWICE DAILY 60 tablet 5  . clindamycin (CLEOCIN) 300 MG capsule Take 2 capsules (600 mg total) by mouth once. 2 hours prior to procedure. 2 capsule 0  . dicyclomine (BENTYL) 10 MG capsule TAKE 1 CAPSULE BY MOUTH FOUR TIMES DAILY BEFORE MEALS AND AT BEDTIME 120 capsule 2  . fluticasone (FLONASE) 50 MCG/ACT nasal spray Place 2  sprays into both nostrils daily. 16 g 2  . levETIRAcetam (KEPPRA) 500 MG tablet TAKE 1 TABLET BY MOUTH TWICE DAILY 60 tablet 0  . levothyroxine (SYNTHROID, LEVOTHROID) 88 MCG tablet TAKE 1 TABLET BY MOUTH EVERY DAY BEFORE BREAKFAST 30 tablet 11  . loperamide (IMODIUM) 2 MG capsule Take by mouth as needed for diarrhea or loose stools.    . methotrexate (RHEUMATREX) 2.5 MG tablet Take 4 tablets (10 mg total) by mouth once a week. Caution:Chemotherapy. Protect from light. 16 tablet 1  . metoprolol (LOPRESSOR) 50 MG tablet Take 1 tablet (50 mg total) by mouth 2 (two) times daily. 180 tablet 1  . mirtazapine (REMERON) 15 MG tablet TAKE 1 TABLET BY MOUTH EVERY DAY 90 tablet 0  . Multiple Vitamins-Minerals (EYE VITAMINS PO) Take 2 tablets by mouth daily.     Marland Kitchen omeprazole (PRILOSEC) 40 MG capsule TAKE ONE CAPSULE BY MOUTH EVERY DAY 30 capsule 5  . ONE TOUCH ULTRA TEST test strip TEST ONCE DAILY AND AS NEEDED 100 each 5  . potassium chloride 20 MEQ/15ML (10%) SOLN GIVE 15 ML (20 MEQ) EVERY DAY PER FEEDING TUBE. 473 mL 1  . potassium chloride SA (K-DUR,KLOR-CON) 20 MEQ tablet TAKE 2 TABLETS BY MOUTH TWICE DAILY 120 tablet 4  . Probiotic Product (PROBIOTIC PO) Take 1 capsule by mouth daily.    . simvastatin (ZOCOR) 40 MG tablet TAKE 1 TABLET BY MOUTH EVERY DAY 30 tablet 3  . traMADol (ULTRAM) 50 MG tablet Take 1/2 tab QID PRN for severe pain. 28 tablet 0  . XARELTO 20 MG TABS tablet TAKE 1 TABLET BY MOUTH EVERY DAY 30 tablet 5   No current facility-administered medications for this visit.    Physical Exam: Filed Vitals:   12/28/14 1639  BP: 120/74  Pulse: 73  Height: 5\' 4"  (1.626 m)  Weight: 170 lb 9.6 oz (77.384 kg)    GEN- The patient is chronically ill appearing, alert, significant expressive aphasia/ dysarthria persists   Head- normocephalic, atraumatic Eyes-  Sclera clear, conjunctiva pink Ears- hearing intact Oropharynx- clear Lungs- Clear to ausculation bilaterally, normal work of  breathing Chest- pacemaker pocket is well healed Heart- Regular rate and rhythm, no murmurs, rubs or gallops, PMI not laterally displaced GI- soft, NT, ND, + BS Extremities- no clubbing, cyanosis, or edema Neuro- expressive aphasia, R sided weakness is significant  Pacemaker interrogation- reviewed in detail today,  See PACEART report  Assessment and Plan:  1. Paroxysmal atrial fibrillation Maintaining sinus rhythm off of AAD therapy Continue life long anticoagulation with xarelto Consider reducing metoprolol if no further arrhythmias upon return  2. Sick sinus syndrome/ symptomatic bradycardia Normal pacemaker function See Pace Art report No changes today  3. Advanced  deficit s/p stroke She continues to make slow progress Followed by neurology  [facility] return to see EP NP every 6 months I will see when needed  Thompson Grayer MD, [facility] 12/28/2014 5:03 PM

## 2014-12-28 NOTE — Patient Instructions (Signed)
Medication Instructions:  Your physician recommends that you continue on your current medications as directed. Please refer to the Current Medication list given to you today.   Labwork: None ordered   Testing/Procedures: None ordered   Follow-Up: Your physician wants you to follow-up in: 6 months with Chanetta Marshall, NP You will receive a reminder letter in the mail two months in advance. If you don't receive a letter, please call our office to schedule the follow-up appointment.  Remote monitoring is used to monitor your Pacemaker  from home. This monitoring reduces the number of office visits required to check your device to one time per year. It allows [place] to keep an eye on the functioning of your device to ensure it is working properly. You are scheduled for a device check from home on 03/29/15. You may send your transmission at any time that day. If you have a wireless device, the transmission will be sent automatically. After your physician reviews your transmission, you will receive a postcard with your next transmission date.    Any Other Special Instructions Will Be Listed Below (If Applicable).     If you need a refill on your cardiac medications before your next appointment, please call your pharmacy.

## 2015-01-01 ENCOUNTER — Other Ambulatory Visit: Payer: Self-pay

## 2015-01-01 MED ORDER — METOPROLOL TARTRATE 50 MG PO TABS: 50.0000 mg | ORAL_TABLET | Freq: Two times a day (BID) | ORAL | Status: DC

## 2015-01-11 ENCOUNTER — Other Ambulatory Visit: Payer: Self-pay | Admitting: Family Medicine

## 2015-01-18 ENCOUNTER — Ambulatory Visit (INDEPENDENT_AMBULATORY_CARE_PROVIDER_SITE_OTHER): Payer: Medicare Other | Admitting: Neurology

## 2015-01-18 VITALS — BP 150/92 | HR 98 | Ht 64.0 in | Wt 173.6 lb

## 2015-01-18 DIAGNOSIS — F32A Depression, unspecified: Secondary | ICD-10-CM

## 2015-01-18 DIAGNOSIS — I639 Cerebral infarction, unspecified: Secondary | ICD-10-CM | POA: Diagnosis not present

## 2015-01-18 DIAGNOSIS — F329 Major depressive disorder, single episode, unspecified: Secondary | ICD-10-CM

## 2015-01-18 DIAGNOSIS — R569 Unspecified convulsions: Secondary | ICD-10-CM

## 2015-01-18 DIAGNOSIS — I63412 Cerebral infarction due to embolism of left middle cerebral artery: Secondary | ICD-10-CM

## 2015-01-18 NOTE — Progress Notes (Signed)
[facility]    Provider: Dr Jaynee Eagles Referring Provider: Chevis Pretty, * Primary Care Physician: Redge Gainer, MD  CC: Stroke  Interval update 01/18/2015: Discussed recent eeg that was abnormal. She is on the keppra and appears to be well controlled.  She continues to struggle with depression. I highly recommend therapy and psychiatry. Spoke with them at length about this.  IMPRESSION:  Abnormal EEG in awake state demonstrating: 1. Frequent left fronto-temporal sharp waves noted over F7 and T3 electrodes. 2. Mild left hemispheric slowing in the 6-7 Hz range. 3. No electrographic seizures are seen.  Interval update 10/19/2014:  Discussed records from [place] at length, I reviewed over 700 pages of records in total of patient's inpatient, acute rehab and subacute rehab stays. Patient's husband had a lot of questions about her seizures, EEGs and subsequent treatment with antiepileptic drugs after her stroke. Discussed this in detail with him please see addendum which does show that patient had episodes of alteration and confusion, epileptiform activity and seizures confirmed on EEG. She is not having episodes of confusion since then. No episodes of staring, non-purposeful movements or staring episodes or anything concerning for ongoing partial seizures. However she says that she can't remember coming down here in May. Her long term memory is good. She can remember things that she did a long time ago. She can't remember things that happened yesterday, doesn't remember people that they talk to since she had the stroke. All these symptoms have started since the stroke. She isn't very social. She does have a long history of mood disorder. She does want to see her family and is interested in seeing her grandkids. She is not very interested in other things. She is not positive, she is frequently sad. She doesn't laugh very often. Discussed that considering her large stroke,  she is at high risk for continued seizures and I recommend continued Rep she is not having side effects. We could repeat EEG and try weaning her off the [place] if they feel strongly about trying to discontinue medication. However I suggest follow up with Dr. Erlinda Hong who is a stroke specialist. Two sleep studies were negative.    Initial visit 07/15/2014: EMMEE LISZKA is a 69 y.o. female here as a referral from Dr. Hassell Done for stroke. She has a past medical history of hypertension, diabetes, high cholesterol, atrial fibrillation, depression, anxiety, hypothyroidism, peripheral neuropathy. She sees Dr. Rayann Heman as her cardiologist and family speaks very highly of him, for paroxysmal A. Fib.. She is here with her husband and son who provide most of the information. Husband is very concerned as he feels patient's memory is affected, her mood is poor, he is unsure why she was treated for seizures in the past and whether she even has seizures. Stroke was in February 20 15th. Patient suffered a stroke during ablation for atrial fibrillation. She was taken to [facility] and was admitted for stroke. She was in rehab at [facility] after the stroke. Then she went to [facility] for rehabilitation and was diagnosed with seizures. She was seeing a neurologist in [place] and had multiple EEGs and husband is unsure if she was being treated appropriately. She has residual right-sided weakness and aphasia. Per husband and son, she has physically significantly improved since her stroke. She is in physical therapy, occupational therapy as well as speech therapy.   However the biggest concern is her mood. They feel she has depression. When patient is asked if she  is depressed she nods yes. Some weeks are better than others but she has lost interest in doing activities that she liked, she appears very fatigued, disinterest in activities, sad most days. She gets frustrated with phones, she  won't use a computer, she gets flustered. Her mood is variable, sometimes she is happier than other times. Motivation fluctuates. She doesn't want to go out of the house. Son's wife spends time with patient and is worried about patient's outlook.. Family Questions on whether or not this is associated with stroke. They're very concerned and would like to help patient. She has lost weight and has decreased appetite.   Review of Systems: Patient complains of symptoms per HPI as well as the following symptoms: Fatigue, and expected weight change, eye itching, light sensitivity, blurred vision. Pertinent negatives per HPI. All others negative.   Social History   Social History  . Marital Status: Married    Spouse Name: [name]"  . Number of Children: 2  . Years of Education: 12+   Occupational History  . ADMINISTRATION    Social History Main Topics  . Smoking status: Never Smoker   . Smokeless tobacco: Never Used  . Alcohol Use: No  . Drug Use: No  . Sexual Activity: Yes   Other Topics Concern  . Not on file   Social History Narrative   Lives in [place].     Caffeine use: drinks coffee/tea: 2 cups coffee in the morning, drinks 1 glass tea per day        Family History  Problem Relation Age of Onset  . Breast cancer Sister   . Ovarian cancer Sister   . Uterine cancer Paternal Aunt   . Crohn's disease Other     neice  . Diabetes Mother   . Heart disease Mother   . Diabetes Maternal Grandmother   . Diabetes Sister   . Heart disease Father   . Heart disease Brother   . Heart disease Brother   . Colon cancer Neg Hx   . Stomach cancer Neg Hx     Past Medical History  Diagnosis Date  . Impaired glucose tolerance   . Psoriasis   . IBS (irritable bowel syndrome)     vs diarrhea vs abd. fullness   . Chronic anxiety   . Uterine prolapse   . Bronchial spasms   . Hypertension   . Esophageal stricture   . Elevated blood sugar   . Diverticulosis   . Rectocele,  female   . History of bladder repair surgery   . Hx of vaginal hysterectomy   . Vaginal prolapse 1998  . Hypothyroidism   . Psoriasis   . Tachy-brady syndrome ([place])     a. 08/19/2008 s/p PPM: SJM 2110 Accent  . Complication of anesthesia     " I shake real bad "  . Family history of anesthesia complication     Daughter also shakes while waking up  . GERD (gastroesophageal reflux disease)   . H/O hiatal hernia   . Neuromuscular disorder ([place])     periferal neuropathy  . Anemia   . Paroxysmal atrial fibrillation ([facility])     a. failed flecainide, tikosyn, amio;  b. 01/2012 s/p RFCA.  . H/O scarlet fever   . Pacemaker     St. Jude    Past Surgical History  Procedure Laterality Date  . Vaginal hysterectomy      prolapse   . Bladder suspension    . Rectocele repair    .  Transthoracic echocardiogram  2008  . Tee without cardioversion  02/06/2012    Procedure: TRANSESOPHAGEAL ECHOCARDIOGRAM (TEE);  Surgeon: Thayer Headings, MD;  Location: [facility] ENDOSCOPY;  Service: Cardiovascular;  Laterality: N/A;  . Atrial fibrillation ablation  02/06/12    PVI by Dr Rayann Heman  . Insert / replace / remove pacemaker      SJM  . Atrial fibrillation ablation  07/04/2012    repeat PVI by Dr Rayann Heman  . Tee without cardioversion N/A 07/04/2012    Procedure: TRANSESOPHAGEAL ECHOCARDIOGRAM (TEE);  Surgeon: Lelon Perla, MD;  Location: [facility] ENDOSCOPY;  Service: Cardiovascular;  Laterality: N/A;  . Atrial fibrillation ablation N/A 02/06/2012    Procedure: ATRIAL FIBRILLATION ABLATION;  Surgeon: Thompson Grayer, MD;  Location: [facility] CATH LAB;  Service: Cardiovascular;  Laterality: N/A;  . Atrial fibrillation ablation N/A 07/04/2012    Procedure: ATRIAL FIBRILLATION ABLATION;  Surgeon: Thompson Grayer, MD;  Location: [facility] CATH LAB;  Service: Cardiovascular;  Laterality: N/A;  . Left heart catheterization with coronary angiogram N/A 03/24/2013    Procedure: LEFT HEART CATHETERIZATION WITH CORONARY ANGIOGRAM;  Surgeon: Blane Ohara, MD;  Location: [facility] CATH LAB;  Service: Cardiovascular;  Laterality: N/A;    Current Outpatient Prescriptions  Medication Sig Dispense Refill  . acetaminophen (TYLENOL) 500 MG tablet Take 500 mg by mouth every 6 (six) hours as needed (pain).     . Ascorbic Acid (VITAMIN C PO) Take 1 tablet by mouth daily.    Marland Kitchen bismuth subsalicylate (PEPTO BISMOL) 262 MG/15ML suspension Take 30 mLs by mouth every 4 (four) hours as needed for indigestion.    . Calcium Carbonate-Vitamin D (CALCIUM-VITAMIN D) 500-200 MG-UNIT per tablet Take 1 tablet by mouth daily.    . cholecalciferol (VITAMIN D) 1000 UNITS tablet Take 1,000 Units by mouth daily.    . citalopram (CELEXA) 20 MG tablet TAKE 1 TABLET(20 MG) BY MOUTH TWICE DAILY 60 tablet 5  . clindamycin (CLEOCIN) 300 MG capsule Take 2 capsules (600 mg total) by mouth once. 2 hours prior to procedure. 2 capsule 0  . dicyclomine (BENTYL) 10 MG capsule TAKE 1 CAPSULE BY MOUTH FOUR TIMES DAILY BEFORE MEALS AND AT BEDTIME 120 capsule 2  . fluticasone (FLONASE) 50 MCG/ACT nasal spray Place 2 sprays into both nostrils daily. 16 g 2  . levETIRAcetam (KEPPRA) 500 MG tablet TAKE 1 TABLET BY MOUTH TWICE DAILY 60 tablet 0  . levothyroxine (SYNTHROID, LEVOTHROID) 88 MCG tablet TAKE 1 TABLET BY MOUTH EVERY DAY BEFORE BREAKFAST 30 tablet 11  . loperamide (IMODIUM) 2 MG capsule Take by mouth as needed for diarrhea or loose stools.    . methotrexate (RHEUMATREX) 2.5 MG tablet Take 4 tablets (10 mg total) by mouth once a week. Caution:Chemotherapy. Protect from light. 16 tablet 1  . metoprolol (LOPRESSOR) 50 MG tablet Take 1 tablet (50 mg total) by mouth 2 (two) times daily. 180 tablet 3  . mirtazapine (REMERON) 15 MG tablet TAKE 1 TABLET BY MOUTH EVERY DAY 90 tablet 0  . Multiple Vitamins-Minerals (EYE VITAMINS PO) Take 2 tablets by mouth daily.     Marland Kitchen omeprazole (PRILOSEC) 40 MG capsule TAKE ONE CAPSULE BY MOUTH EVERY DAY 30 capsule 5  . ONE TOUCH ULTRA TEST test strip TEST  ONCE DAILY AND AS NEEDED 100 each 5  . potassium chloride 20 MEQ/15ML (10%) SOLN GIVE 15 ML (20 MEQ) EVERY DAY PER FEEDING TUBE. 473 mL 1  . potassium chloride SA (K-DUR,KLOR-CON) 20 MEQ tablet TAKE 2 TABLETS BY  MOUTH TWICE DAILY 120 tablet 4  . Probiotic Product (PROBIOTIC PO) Take 1 capsule by mouth daily.    . simvastatin (ZOCOR) 40 MG tablet TAKE 1 TABLET BY MOUTH EVERY DAY 30 tablet 3  . traMADol (ULTRAM) 50 MG tablet Take 1/2 tab QID PRN for severe pain. 28 tablet 0  . XARELTO 20 MG TABS tablet TAKE 1 TABLET BY MOUTH EVERY DAY 30 tablet 5   No current facility-administered medications for this visit.    Allergies as of 01/18/2015 - Review Complete 12/28/2014  Allergen Reaction Noted  . Latex Rash 06/27/2012  . Caudal tray [lidocaine-epinephrine]  06/22/2010  . Ciprofloxacin hcl Nausea Only 06/03/2012  . Codeine Nausea Only   . Diovan [valsartan] Itching 06/22/2010  . Doxycycline Other (See Comments)   . Esomeprazole magnesium  06/22/2010  . Levofloxacin  06/22/2010  . Myrbetriq [mirabegron] Diarrhea 12/01/2013  . Neomycin-bacitracin zn-polymyx    . Penicillins    . Sulfa drugs cross reactors Other (See Comments) 06/22/2010  . Verapamil  06/22/2010  . Vimovo [naproxen-esomeprazole]  06/22/2010  . Zocor [simvastatin]  06/22/2010    Vitals: BP 150/92 mmHg  Pulse 98  Ht 5\' 4"  (1.626 m)  Wt 173 lb 9.6 oz (78.744 kg)  BMI 29.78 kg/m2  SpO2 96% Last Weight:  Wt Readings from Last 1 Encounters:  01/18/15 173 lb 9.6 oz (78.744 kg)   Last Height:   Ht Readings from Last 1 Encounters:  01/18/15 5\' 4"  (1.626 m)    Speech:  Expressive aphasia, comprehension appears to be intact Cognition:  The patient is oriented to person, place, and time;   recent and remote memory do not appear to be affected   normal attention, concentration,   fund of knowledge appears grossly intact Cranial Nerves:  The pupils are equal, round, and reactive to light. Visual fields  are full to finger confrontation. Extraocular movements are intact. Trigeminal sensation is intact and the muscles of mastication are normal. Right lower facial droop. The palate elevates in the midline. Hearing intact. Voice is normal. Shoulder shrug is normal. The tongue has normal motion without fasciculations.   Coordination:  No dysmetria on the left, cannot perform on the right due to weakness  Gait:  With walker  Motor Observation:  No asymmetry, no atrophy, and no involuntary movements noted. Tone:  Normal muscle tone.   Posture:  Posture is normal. normal erect   Strength: right arm and hip flexion 4/5 and right DF 4+/5  Otherwise strength is V/V in the left upper and left lower limbs.    Sensation: intact to LT, there does not appear to be hemisensory loss, patient reports equal sensation bilaterally to touch. However difficult exam as patient is mostly nonverbal.. However I would expect hemisensory loss based on size and location of CVA.   Reflex Exam:   Toes:  Right upgoing  Clonus:  Clonus is absent.    Assessment/Plan: 69 year old patient who presents with her husband and son here for follow up of stroke with residual right-sided hemiparesis and aphasia. By account she appears to be doing very well after her stroke as far as physical therapy, occupational therapy and speech therapy are concern. However patient appears to have significant depression which is concerning for husband and son, discussed depression at length with family, this is very common after stroke. Patient is on Celexa and follows closely with Dr. Redge Gainer. They have been evaluated by primary care to ensure there are no medical causes for patient's  fatigue and depression including thyroid testing, ensuring there is no anemia, or any infection or any other metabolic, toxic etiology. Highly recommended following up with therapy and psychiatry. The family appears very  earnest in their concern and caring for patient, the effects of stroke on a family can be devastating. Discussed this at length with family.   I would like patient to continue Keppra. EEG was abnormal, keppra will likely be life long. Continue Xarelto and statin for stroke prevention. Follow closely with primary care for management of vascular risk factors. Continue physical therapy, occupational therapy, speech therapy. Struggles with depression, needs therapy and psychiatry.   Addendum 07/20/2014: Received extensive records.  [facility] rehabilitation consult notes: Patient was initially admitted to [facility] for electrophysiology study and radiofrequency ablation in February 2015. Unfortunately postoperatively she was noted to be weak on her right side. She was diagnosed with a left embolic MCA distribution CVA. She was also noted to have significant dysphagia and dysarthria. Resultant right-sided hemiparesis and hemisensory loss with dense expressive aphasia, dysarthria and dysphasia. She was admitted to the inpatient rehabilitation unit in 04/26/2013. Notes state that at that time of admission she had complete plegia 0/5 in the right upper extremity and right lower extremity. She was able to follow commands but was mostly nonverbal. Also reported right-sided hemisensory loss.  Per notes, patient appeared lethargic and CT of the head and EEG was performed in rehabilitation. CT on approx 04/29/2013 was compared with CT on 04/16/2013 from [facility], showed an acute left MCA infarction with increased density in the proximal left MCA likely reflecting a thrombus but could be artifactual. No acute intracranial hemorrhage. This finding again likely reflects the left MCA thrombus and now subacute left MCA infarction. EEG on 04/30/2013 showed mild encephalopathy, left cerebral hemisphere structural and/or physiologic brain damage or dysfunction secondary to recent large middle  cerebral artery territory infarction, decreased threshold for seizure of bilateral cerebral hemisphere origin with focalization over the middle aspect of the temporal lobes. Right more than left. No clear evidence of active seizure. Overnight oximetry report was performed on 04/30/2013. The patient was on room air and observed to have slept during testing, recorded over 6 hours 32 minutes, the lowest SaO2 recorded was 93%, negative study for oxygen desaturation.  GI Consult Notes from 05/08/2013 discussed watery diarrhea, C. difficile testing was negative. There is no blood or mucus in the stool. At that time she was on tube feeds, diarrhea started after Glucerna. Diarrhea most likely due to tube feed induced diarrhea. Patient was again seen on April 1 as requested for the evaluation of persistent diarrhea. 84 notes state patient had at least 3-4 loose bowel movements daily, C. difficile has been negative, no leukocytosis. Negative workup for infectious etiology. Physician discussed the possible etiologies with patient and her husband. A sigmoidoscopy was recommended to look for pseudomembranous colitis, ulcerative colitis, microscopic or collagenous colitis. Patient was put on Levaquin, metronidazole. Sigmoidoscopy showed mild diverticulosis on the left side, patchy erythema with exudate consistent with colitis in the rectosigmoid area biopsied for treatment, sensitive and spastic colon.   Patient was discharged from rehabilitation on 05/23/2013. Per discharge summary: Patient made fair progression throughout her rehabilitation hospitalization. At that time patient had passed a swallow study and was put on oral diet. Continued speech therapy and SLP was recommended. Discharge summary states that neurology started patient on Ritalin for decreased attention. Discharge summary states that she was diagnosed with colitis and started on  antibiotics. She did undergo a sigmoidoscopy the day prior to her  discharge. Paxil was increased several days prior to her discharge due to depression. Patient was transferred from inpatient rehabilitation to skilled nursing.    EEG performed 08/01/2013 patient was sent from her subacute rehabilitation facility. Impression was abnormal EEG with finding indicating left cerebral hemisphere structural and/or physiologic brain damage or dysfunction. Epileptiform activity with epileptogenic potential over the left cerebral hemisphere (phase reversal at T3) with possibility of localization over the left mid temporal lobes.  Notes from neurology and sleep clinic of [place]: Notes state that this physician first saw patient in rehabilitation on 04/29/2013. Patient was lethargic and she had an abnormal EEG on 04/30/2013. At that time physician started on Keppra and performed an overnight pulse ox (see above for results). No clinical seizures were recorded. She was started on Keppra 250 mg 3 times a day. Repeat EEG was performed on 08/01/2013 (see above for results) EEG on 10/21/2013 was abnormal awake and asleep EEG showing brief electrographic subclinical seizures and interictal discharges arising from the left frontal temporal lobes. EEG 03/10/2014 showed minimal focal epileptiform activity arising from the anterior aspect of the left frontal temporal lobe with no electrographic subclinical seizures. Physician attributed her lethargy and cognitive mentation changes as attributed to seizure activity. Keppra was increased to 500 in the morning and 750 in the evenings in October 2015.  Still pending notes from [place]: reviewed 561 pages of records. Significant details as relating to her neurologic management was noted below:  Encounter date 02/28/2013: She had 2 catheter ablation procedures(2013 and 2014) for afib on xarelto and, s/p pacemaker, antiarrhythmic medications. Still with continued afib, fatigue and palpitations. Hx of  impaired glucose control, HTN, DM,chronic back pain, bronchospasm, hypothyroidism, IBS, peripheral neuropathy, anxiety, and psoriasis. Discussed combined endocardial and epicardial ablation procedure.   03/24/2013: left heart cath, selective coronary angiography, LV angiography results show normal coronary arteries with nml left ventricular systolic function.  03/03/2013: PMHx paroxysmal afib, sinus node dysfunction and tachybradycardia syndrome s/p pacemaker in 2010. They discussed again combined endocardial and epicardial ablation procedure or as a last resort AV node ablation.  Echo 03/2013: no intracardiac thrombus, nml left ventricular EF, nml right ventricular contractile performance.   Carotid duplex 04/14/2013: Right carotid: Evidence in the ICA of a less than 40% stenocis Left carotid: evidence in the ICA of a less than 40% stenosis. Non-hemodynamically significant plaque noted in the cca. Vertebrals: patent with ategrade flow Subclavians: normal flow bilaterally  2/24/105: procedure for transabdominal laporoscopic epicardial and endocardial radiofrequency ablation of the left atrium. Notes state successful hybrid ablation procedure for paroxysmal afib. Notes state that pt begain to wake up from anesthesia and not moving right arm. Right arm no response to pain. Patient non-verbal but responding by nodding, all other extremities moving appropriately. She did start to grimace to pain on the right arm. Also noted to have right facial droop. Neurology was consulted.  Neurology note: noted right facial droop, patient following some simple commands, speech with aphasia, no movement in right arm and leg. NIHSS 25. She was not a tPA candidate. CT head stat, could not have MRI due to pacemaker. Large L MCA stroke, increased density in the proximal left MCA likely thrombus with resultant right-sided weakness and expressive aphasia with a left gaze preference.  ldl 21, hgba1c 6.2,   Neurology note  2/28: neurologically stable for 3 days and transferred back to  the [facility] from the [facility]. Neurologic exam was limited due to hypersomnolence. Slight gaze preference, can cross the midline, right facial droop, decreased tone in the right arm with pronator drift, not moving r arm or leg even to noxious stim. Left side moving spontaneously, upgoing r toe.    EGD with peg tube 04/23/2013.Marland Kitchen  She was discharged to inpatient rehab(see details above) but did not regain any movement to her right side prior to discharge.   Sarina Ill, MD  [facility] [address] [place], [place] [address]  Phone [phone] Fax [phone]  A total of 60 minutes was spent face-to-face with this patient. Over half this time was spent on counseling patient on the stroke, seizure, depression diagnosis and different diagnostic and therapeutic options available.

## 2015-01-18 NOTE — Patient Instructions (Signed)
Remember to drink plenty of fluid, eat healthy meals and do not skip any meals. Try to eat protein with a every meal and eat a healthy snack such as fruit or nuts in between meals. Try to keep a regular sleep-wake schedule and try to exercise daily, particularly in the form of walking, 20-30 minutes a day, if you can.   As far as your medications are concerned, I would like to suggest: continue current medications  I would like to see you back in 6 months, sooner if we need to. Please call us with any interim questions, concerns, problems, updates or refill requests.   Our phone number is [phone]. We also have an after hours call service for urgent matters and there is a physician on-call for urgent questions. For any emergencies you know to call 911 or go to the nearest emergency room

## 2015-01-28 ENCOUNTER — Other Ambulatory Visit: Payer: Self-pay | Admitting: Neurology

## 2015-02-11 ENCOUNTER — Other Ambulatory Visit: Payer: Self-pay | Admitting: Family Medicine

## 2015-02-18 ENCOUNTER — Telehealth: Payer: Self-pay | Admitting: *Deleted

## 2015-02-18 ENCOUNTER — Other Ambulatory Visit: Payer: Self-pay | Admitting: Family Medicine

## 2015-02-18 NOTE — Telephone Encounter (Signed)
Patient called and I spoke with her husband and patient does not wish to see psychiatrist at this time. Patients husband will let us know if she changes her mind.

## 2015-02-18 NOTE — Telephone Encounter (Signed)
-----   Message from Chipper Herb, MD sent at 01/19/2015 10:22 PM EST ----- Please arrange for this patient to see a psychiatrist to help [place] with getting her depression under better control------ Dr. Letta Moynahan ----- Message -----    From: Melvenia Beam, MD    Sent: 01/19/2015   5:57 PM      To: Chipper Herb, MD  Thank you very much for allowing me to participate in the care of this nice patient. I am forwarding my office encounter to you for review.   If you have any questions or concerns,  please do not hesitate to call me at [phone].   Sincerely,  Georgia Dom, MD

## 2015-03-04 ENCOUNTER — Ambulatory Visit: Payer: Medicare Other | Admitting: Family Medicine

## 2015-03-19 ENCOUNTER — Encounter: Payer: Self-pay | Admitting: Family Medicine

## 2015-03-19 ENCOUNTER — Ambulatory Visit (INDEPENDENT_AMBULATORY_CARE_PROVIDER_SITE_OTHER): Payer: Medicare Other | Admitting: Family Medicine

## 2015-03-19 VITALS — BP 134/74 | HR 68 | Temp 96.6°F | Ht 64.0 in | Wt 178.0 lb

## 2015-03-19 DIAGNOSIS — I10 Essential (primary) hypertension: Secondary | ICD-10-CM | POA: Diagnosis not present

## 2015-03-19 DIAGNOSIS — E038 Other specified hypothyroidism: Secondary | ICD-10-CM

## 2015-03-19 DIAGNOSIS — E034 Atrophy of thyroid (acquired): Secondary | ICD-10-CM | POA: Diagnosis not present

## 2015-03-19 DIAGNOSIS — R531 Weakness: Secondary | ICD-10-CM

## 2015-03-19 DIAGNOSIS — E559 Vitamin D deficiency, unspecified: Secondary | ICD-10-CM

## 2015-03-19 DIAGNOSIS — I6349 Cerebral infarction due to embolism of other cerebral artery: Secondary | ICD-10-CM | POA: Diagnosis not present

## 2015-03-19 DIAGNOSIS — E1140 Type 2 diabetes mellitus with diabetic neuropathy, unspecified: Secondary | ICD-10-CM

## 2015-03-19 DIAGNOSIS — I63412 Cerebral infarction due to embolism of left middle cerebral artery: Secondary | ICD-10-CM

## 2015-03-19 DIAGNOSIS — I495 Sick sinus syndrome: Secondary | ICD-10-CM | POA: Diagnosis not present

## 2015-03-19 DIAGNOSIS — I480 Paroxysmal atrial fibrillation: Secondary | ICD-10-CM

## 2015-03-19 DIAGNOSIS — M6289 Other specified disorders of muscle: Secondary | ICD-10-CM

## 2015-03-19 DIAGNOSIS — E785 Hyperlipidemia, unspecified: Secondary | ICD-10-CM

## 2015-03-19 LAB — POCT GLYCOSYLATED HEMOGLOBIN (HGB A1C): Hemoglobin A1C: 7.6

## 2015-03-19 MED ORDER — SIMVASTATIN 40 MG PO TABS: 40.0000 mg | ORAL_TABLET | Freq: Every day | ORAL | Status: DC

## 2015-03-19 MED ORDER — RIVAROXABAN 20 MG PO TABS: 20.0000 mg | ORAL_TABLET | Freq: Every day | ORAL | Status: DC

## 2015-03-19 NOTE — Progress Notes (Signed)
Subjective:    Patient ID: [name], female    DOB: 06-21-1945, 70 y.o.   MRN: [id number]  HPI Pt here for follow up and management of chronic medical problems which includes hypothyroid and hyperlipidemia. She is taking medications regularly. The patient complains of some congestion today and she is concerned that she may have a hernia. Home blood sugars maybe running as high as 150-180 at times. The patient denies any chest pain or shortness of breath anymore than usual. She does experience shortness of breath with activity but this is like usual. She is not having any trouble with her GI tract blood in the stool or black tarry bowel movements or nausea or vomiting. She does have urinary incontinence and has seen the urologist about this he feels that there is nothing that we can do about this but lived with it. She does wear a pad. She does have cataracts bilaterally and they will be seeing the ophthalmologist again in March and consideration will be made at that time for removing the cataracts. She is reminded to have them send [place] a note about her eye exam.      Patient Active Problem List   Diagnosis Date Noted  . Cerebrovascular accident (CVA) due to embolism of left middle cerebral artery ([facility]) 11/24/2014  . Focal and partial seizures ([place]) 11/24/2014  . Depression 11/24/2014  . Cerebral infarction due to embolism of other cerebral artery ([place]) 09/03/2013  . Metabolic syndrome 51/03/5850  . High risk medication use 03/13/2013  . Fever, unspecified 06/05/2012  . Generalized abdominal pain 06/05/2012  . PAF (paroxysmal atrial fibrillation) ([place]) 02/07/2012  . Tachy-brady syndrome ([place])   . Impaired glucose tolerance   . Fatigue 12/22/2009  . SNORING 12/22/2009  . OTHER CHRONIC NONALCOHOLIC LIVER DISEASE 77/82/4235  . NONSPEC ELEVATION OF LEVELS OF TRANSAMINASE/LDH 03/09/2009  . PPM-[place] 09/01/2008  . BRADYCARDIA 08/13/2008  . Hypothyroidism 06/09/2008  . HTN  (hypertension) 06/09/2008  . Atrial fibrillation ([place]) 06/09/2008  . PREMATURE VENTRICULAR CONTRACTIONS 06/09/2008  . IRRITABLE BOWEL SYNDROME 06/09/2008  . PSORIASIS 06/09/2008  . EDEMA 06/09/2008  . DIVERTICULOSIS, COLON, HX OF 06/09/2008   Outpatient Encounter Prescriptions as of 03/19/2015  Medication Sig  . acetaminophen (TYLENOL) 500 MG tablet Take 500 mg by mouth every 6 (six) hours as needed (pain).   . Ascorbic Acid (VITAMIN C PO) Take 1 tablet by mouth daily.  Marland Kitchen bismuth subsalicylate (PEPTO BISMOL) 262 MG/15ML suspension Take 30 mLs by mouth every 4 (four) hours as needed for indigestion.  . Calcium Carbonate-Vitamin D (CALCIUM-VITAMIN D) 500-200 MG-UNIT per tablet Take 1 tablet by mouth daily.  . cholecalciferol (VITAMIN D) 1000 UNITS tablet Take 1,000 Units by mouth daily.  . citalopram (CELEXA) 20 MG tablet TAKE 1 TABLET(20 MG) BY MOUTH TWICE DAILY  . clindamycin (CLEOCIN) 300 MG capsule Take 2 capsules (600 mg total) by mouth once. 2 hours prior to procedure.  . dicyclomine (BENTYL) 10 MG capsule TAKE 1 CAPSULE BY MOUTH FOUR TIMES DAILY BEFORE MEALS AND AT BEDTIME  . fluticasone (FLONASE) 50 MCG/ACT nasal spray Place 2 sprays into both nostrils daily.  Marland Kitchen levETIRAcetam (KEPPRA) 500 MG tablet TAKE 1 TABLET BY MOUTH TWICE DAILY  . levothyroxine (SYNTHROID, LEVOTHROID) 88 MCG tablet TAKE 1 TABLET BY MOUTH EVERY DAY BEFORE BREAKFAST  . loperamide (IMODIUM) 2 MG capsule Take by mouth as needed for diarrhea or loose stools.  . methotrexate (RHEUMATREX) 2.5 MG tablet Take 4 tablets (10 mg total) by mouth  once a week. Caution:Chemotherapy. Protect from light.  . metoprolol (LOPRESSOR) 50 MG tablet Take 1 tablet (50 mg total) by mouth 2 (two) times daily.  . mirtazapine (REMERON) 15 MG tablet TAKE 1 TABLET BY MOUTH EVERY DAY  . Multiple Vitamins-Minerals (EYE VITAMINS PO) Take 2 tablets by mouth daily.   Marland Kitchen omeprazole (PRILOSEC) 40 MG capsule TAKE ONE CAPSULE BY MOUTH EVERY DAY  . ONE  TOUCH ULTRA TEST test strip TEST ONCE DAILY AND AS NEEDED  . potassium chloride 20 MEQ/15ML (10%) SOLN GIVE 15 ML (20 MEQ) EVERY DAY PER FEEDING TUBE.  . potassium chloride SA (K-DUR,KLOR-CON) 20 MEQ tablet TAKE 2 TABLETS BY MOUTH TWICE DAILY  . Probiotic Product (PROBIOTIC PO) Take 1 capsule by mouth daily.  . simvastatin (ZOCOR) 40 MG tablet TAKE 1 TABLET BY MOUTH EVERY DAY  . traMADol (ULTRAM) 50 MG tablet Take 1/2 tab QID PRN for severe pain.  Marland Kitchen XARELTO 20 MG TABS tablet TAKE 1 TABLET BY MOUTH EVERY DAY   No facility-administered encounter medications on file as of 03/19/2015.      Review of Systems  Constitutional: Negative.   HENT: Positive for congestion.   Eyes: Negative.   Respiratory: Negative.   Cardiovascular: Negative.   Gastrointestinal: Negative.        Possible hernia  Endocrine: Negative.   Genitourinary: Negative.   Musculoskeletal: Negative.   Skin: Negative.   Allergic/Immunologic: Negative.   Neurological: Negative.   Hematological: Negative.   Psychiatric/Behavioral: Negative.        Objective:   Physical Exam  Constitutional: She is oriented to person, place, and time. She appears well-developed and well-nourished. No distress.  Patient is pleasant and in good spirits and is able to talk more today and express herself better than in the past. She continues to improve from the stroke that she's had in the past.  HENT:  Head: Normocephalic and atraumatic.  Right Ear: External ear normal.  Left Ear: External ear normal.  Nose: Nose normal.  Mouth/Throat: Oropharynx is clear and moist.  Eyes: Conjunctivae and EOM are normal. Pupils are equal, round, and reactive to light. Right eye exhibits no discharge. Left eye exhibits no discharge. No scleral icterus.  Follow-up with ophthalmology as planned  Neck: Normal range of motion. Neck supple. No thyromegaly present.  Cardiovascular: Normal rate, regular rhythm and normal heart sounds.   No murmur  heard. Pulses in the right foot were difficult to palpate. The heart is regular today at 72/m  Pulmonary/Chest: Effort normal and breath sounds normal. No respiratory distress. She has no wheezes. She has no rales. She exhibits no tenderness.  Clear anteriorly and posteriorly  Abdominal: Soft. Bowel sounds are normal. She exhibits no mass. There is no tenderness. There is no rebound and no guarding.  Patient has a ventral hernia just above the umbilicus. It is not causing any pain currently.  Musculoskeletal: She exhibits no edema or tenderness.  The patient is able to walk with a three-pronged cane and she still has right upper extremity weakness from the stroke.  Lymphadenopathy:    She has no cervical adenopathy.  Neurological: She is alert and oriented to person, place, and time. No cranial nerve deficit.  Skin: Skin is warm and dry. No rash noted.  Psychiatric: She has a normal mood and affect. Her behavior is normal. Judgment and thought content normal.  The mood is good and improved though still somewhat depressed about her condition.  Nursing note and vitals reviewed.  BP 134/74 mmHg  Pulse 68  Temp(Src) 96.6 F (35.9 C) (Oral)  Ht '5\' 4"'$  (1.626 m)  Wt 178 lb (80.74 kg)  BMI 30.54 kg/m2        Assessment & Plan:  1. Type 2 diabetes mellitus with diabetic neuropathy, without long-term current use of insulin ([place]) -Continue with current treatment pending results of lab work - POCT glycosylated hemoglobin (Hb A1C) - BMP8+EGFR - CBC with Differential/Platelet  2. Vitamin D deficiency -Continue with current treatment pending results of lab work - CBC with Differential/Platelet - VITAMIN D 25 Hydroxy (Vit-D Deficiency, Fractures)  3. Hyperlipidemia -Continue with aggressive diet measures watching cholesterol and exercising as much as possible and with current cholesterol treatment - CBC with Differential/Platelet - Lipid panel  4. Essential hypertension -Blood pressure  is good today and she should continue with current treatment - BMP8+EGFR - CBC with Differential/Platelet - Hepatic function panel  5. PAF (paroxysmal atrial fibrillation) ([place]) -Follow-up with cardiology and with pacemaker cardiologist if needed - CBC with Differential/Platelet  6. Cerebrovascular accident (CVA) due to embolism of left middle cerebral artery ([facility] [place]) -Old with neurology and continue to exercise regularly and use the week or side and keep strengthening. - CBC with Differential/Platelet  7. Paroxysmal atrial fibrillation ([facility]) -Follow-up with cardiology - CBC with Differential/Platelet  8. Hypothyroidism due to acquired atrophy of thyroid -Continue current treatment pending results of lab work - CBC with Differential/Platelet - Thyroid Panel With TSH  9. Tachy-brady syndrome ([place]) -Follow-up with cardiology - CBC with Differential/Platelet  10. Cerebral infarction due to embolism of other cerebral artery ([facility]) -Follow-up with neurology continue with daily exercises to keep all extremities is strong as possible - CBC with Differential/Platelet  11. Right sided weakness  Meds ordered this encounter  Medications  . simvastatin (ZOCOR) 40 MG tablet    Sig: Take 1 tablet (40 mg total) by mouth daily.    Dispense:  30 tablet    Refill:  5  . rivaroxaban (XARELTO) 20 MG TABS tablet    Sig: Take 1 tablet (20 mg total) by mouth daily.    Dispense:  30 tablet    Refill:  5   Patient Instructions                       Medicare Annual Wellness Visit   and the medical providers at [facility] strive to bring you the best medical care.  In doing so we not only want to address your current medical conditions and concerns but also to detect new conditions early and prevent illness, disease and health-related problems.    Medicare offers a yearly Wellness Visit which allows our clinical staff to assess your need for preventative services  including immunizations, lifestyle education, counseling to decrease risk of preventable diseases and screening for fall risk and other medical concerns.    This visit is provided free of charge (no copay) for all Medicare recipients. The clinical pharmacists at [facility] have begun to conduct these Wellness Visits which will also include a thorough review of all your medications.    As you primary medical provider recommend that you make an appointment for your Annual Wellness Visit if you have not done so already this year.  You may set up this appointment before you leave today or you may call back [phone]) and schedule an appointment.  Please make sure when you call that you mention that you are scheduling  your Annual Wellness Visit with the clinical pharmacist so that the appointment may be made for the proper length of time.     Continue current medications. Continue good therapeutic lifestyle changes which include good diet and exercise. Fall precautions discussed with patient. If an FOBT was given today- please return it to our front desk. If you are over 66 years old - you may need Prevnar 32 or the adult Pneumonia vaccine.  **Flu shots are available--- please call and schedule a FLU-CLINIC appointment**  After your visit with [place] today you will receive a survey in the mail or online from [employer] regarding your care with [place]. Please take a moment to fill this out. Your feedback is very important to [place] as you can help [place] better understand your patient needs as well as improve your experience and satisfaction. WE CARE ABOUT YOU!!!    the patient should continue to follow-up with the cardiologist  She should also follow-up with the neurologist as planned in the dermatologist  She should follow-up with the ophthalmologist as far as her cataracts are concerned and her decreasing vision.  We will call with lab work as soon as it becomes available please take a copy  of this to any of the specialists that you're seeing Mammogram as planned   Arrie Senate MD

## 2015-03-19 NOTE — Patient Instructions (Addendum)
Medicare Annual Wellness Visit  [facility] and the medical providers at [facility] strive to bring you the best medical care.  In doing so we not only want to address your current medical conditions and concerns but also to detect new conditions early and prevent illness, disease and health-related problems.    Medicare offers a yearly Wellness Visit which allows our clinical staff to assess your need for preventative services including immunizations, lifestyle education, counseling to decrease risk of preventable diseases and screening for fall risk and other medical concerns.    This visit is provided free of charge (no copay) for all Medicare recipients. The clinical pharmacists at [facility] have begun to conduct these Wellness Visits which will also include a thorough review of all your medications.    As you primary medical provider recommend that you make an appointment for your Annual Wellness Visit if you have not done so already this year.  You may set up this appointment before you leave today or you may call back [phone] and schedule an appointment.  Please make sure when you call that you mention that you are scheduling your Annual Wellness Visit with the clinical pharmacist so that the appointment may be made for the proper length of time.     Continue current medications. Continue good therapeutic lifestyle changes which include good diet and exercise. Fall precautions discussed with patient. If an FOBT was given today- please return it to our front desk. If you are over 47 years old - you may need Prevnar 68 or the adult Pneumonia vaccine.  **Flu shots are available--- please call and schedule a FLU-CLINIC appointment**  After your visit with [place] today you will receive a survey in the mail or online from [employer] regarding your care with [place]. Please take a moment to fill this out. Your feedback is very  important to [place] as you can help [place] better understand your patient needs as well as improve your experience and satisfaction. WE CARE ABOUT YOU!!!    the patient should continue to follow-up with the cardiologist  She should also follow-up with the neurologist as planned in the dermatologist  She should follow-up with the ophthalmologist as far as her cataracts are concerned and her decreasing vision.  We will call with lab work as soon as it becomes available please take a copy of this to any of the specialists that you're seeing Mammogram as planned

## 2015-03-20 LAB — BMP8+EGFR
BUN/Creatinine Ratio: 14 (ref 11–26)
BUN: 8 mg/dL (ref 8–27)
CO2: 23 mmol/L (ref 18–29)
Calcium: 9.3 mg/dL (ref 8.7–10.3)
Chloride: 104 mmol/L (ref 96–106)
Creatinine, Ser: 0.56 mg/dL — ABNORMAL LOW (ref 0.57–1.00)
GFR calc Af Amer: 112 mL/min/{1.73_m2} (ref >59)
GFR calc non Af Amer: 97 mL/min/{1.73_m2} (ref >59)
Glucose: 159 mg/dL — ABNORMAL HIGH (ref 65–99)
Potassium: 4.8 mmol/L (ref 3.5–5.2)
Sodium: 144 mmol/L (ref 134–144)

## 2015-03-20 LAB — LIPID PANEL
Chol/HDL Ratio: 3.2 ratio units (ref 0.0–4.4)
Cholesterol, Total: 106 mg/dL (ref 100–199)
HDL: 33 mg/dL — ABNORMAL LOW (ref >39)
LDL Calculated: 34 mg/dL (ref 0–99)
Triglycerides: 193 mg/dL — ABNORMAL HIGH (ref 0–149)
VLDL Cholesterol Cal: 39 mg/dL (ref 5–40)

## 2015-03-20 LAB — HEPATIC FUNCTION PANEL
ALT: 16 IU/L (ref 0–32)
AST: 28 IU/L (ref 0–40)
Albumin: 3.9 g/dL (ref 3.6–4.8)
Alkaline Phosphatase: 80 IU/L (ref 39–117)
Bilirubin Total: 0.2 mg/dL (ref 0.0–1.2)
Bilirubin, Direct: 0.11 mg/dL (ref 0.00–0.40)
Total Protein: 6.9 g/dL (ref 6.0–8.5)

## 2015-03-20 LAB — THYROID PANEL WITH TSH
Free Thyroxine Index: 2.4 (ref 1.2–4.9)
T3 Uptake Ratio: 21 % — ABNORMAL LOW (ref 24–39)
T4, Total: 11.4 ug/dL (ref 4.5–12.0)
TSH: 3.22 u[IU]/mL (ref 0.450–4.500)

## 2015-03-20 LAB — CBC WITH DIFFERENTIAL/PLATELET
Basophils Absolute: 0.1 10*3/uL (ref 0.0–0.2)
Basos: 1 %
EOS (ABSOLUTE): 0.5 10*3/uL — ABNORMAL HIGH (ref 0.0–0.4)
Eos: 7 %
Hematocrit: 40.9 % (ref 34.0–46.6)
Hemoglobin: 13.3 g/dL (ref 11.1–15.9)
Immature Grans (Abs): 0 10*3/uL (ref 0.0–0.1)
Immature Granulocytes: 0 %
Lymphocytes Absolute: 2.6 10*3/uL (ref 0.7–3.1)
Lymphs: 35 %
MCH: 29.5 pg (ref 26.6–33.0)
MCHC: 32.5 g/dL (ref 31.5–35.7)
MCV: 91 fL (ref 79–97)
Monocytes Absolute: 0.7 10*3/uL (ref 0.1–0.9)
Monocytes: 10 %
Neutrophils Absolute: 3.5 10*3/uL (ref 1.4–7.0)
Neutrophils: 47 %
Platelets: 260 10*3/uL (ref 150–379)
RBC: 4.51 x10E6/uL (ref 3.77–5.28)
RDW: 14.7 % (ref 12.3–15.4)
WBC: 7.4 10*3/uL (ref 3.4–10.8)

## 2015-03-20 LAB — VITAMIN D 25 HYDROXY (VIT D DEFICIENCY, FRACTURES): Vit D, 25-Hydroxy: 39.9 ng/mL (ref 30.0–100.0)

## 2015-03-29 ENCOUNTER — Ambulatory Visit (INDEPENDENT_AMBULATORY_CARE_PROVIDER_SITE_OTHER): Payer: Medicare Other | Admitting: *Deleted

## 2015-03-29 ENCOUNTER — Telehealth: Payer: Self-pay | Admitting: Internal Medicine

## 2015-03-29 ENCOUNTER — Telehealth: Payer: Self-pay | Admitting: Cardiology

## 2015-03-29 DIAGNOSIS — Z950 Presence of cardiac pacemaker: Secondary | ICD-10-CM

## 2015-03-29 DIAGNOSIS — I495 Sick sinus syndrome: Secondary | ICD-10-CM | POA: Diagnosis not present

## 2015-03-29 NOTE — Telephone Encounter (Addendum)
Dr Laurance Flatten questioned if patient needed to see Dr Lovena Le.  I let him know that she could see Dr Lovena Le in May.  She is not having any problems currently.  He was very appreciative of my call

## 2015-03-29 NOTE — Telephone Encounter (Signed)
Confirmed remote transmission w/ pt husband.   

## 2015-03-29 NOTE — Telephone Encounter (Signed)
New message      Talk to the nurse.  Pt recently saw her PCP---Dr Laurance Flatten.  Calling to see if pt should be seen before next appt in may---Dr Laurance Flatten sort of suggested she be seen.

## 2015-03-29 NOTE — Telephone Encounter (Signed)
lmom for husband to return my call today before 5 or Wed/Thurs/Fri would be good.

## 2015-03-30 NOTE — Progress Notes (Signed)
Remote pacemaker transmission.   

## 2015-04-19 ENCOUNTER — Telehealth: Payer: Self-pay | Admitting: Family Medicine

## 2015-04-19 NOTE — Telephone Encounter (Signed)
Faxed labs DOS 03-19-15 to [facility]: Lattie Haw [phone].

## 2015-04-27 ENCOUNTER — Encounter: Payer: Self-pay | Admitting: Nurse Practitioner

## 2015-04-30 LAB — CUP PACEART REMOTE DEVICE CHECK
Battery Remaining Longevity: 97 mo
Battery Remaining Percentage: 81 %
Battery Voltage: 2.93 V
Brady Statistic AP VP Percent: 1 %
Brady Statistic AP VS Percent: 35 %
Brady Statistic AS VP Percent: 3.4 %
Brady Statistic AS VS Percent: 61 %
Brady Statistic RA Percent Paced: 35 %
Brady Statistic RV Percent Paced: 3.5 %
Date Time Interrogation Session: 20170207150900
Implantable Lead Implant Date: 20100630
Implantable Lead Implant Date: 20100630
Implantable Lead Location: 753859
Implantable Lead Location: 753860
Lead Channel Impedance Value: 410 Ohm
Lead Channel Impedance Value: 430 Ohm
Lead Channel Pacing Threshold Amplitude: 0.75 V
Lead Channel Pacing Threshold Amplitude: 1.25 V
Lead Channel Pacing Threshold Pulse Width: 0.4 ms
Lead Channel Pacing Threshold Pulse Width: 0.8 ms
Lead Channel Sensing Intrinsic Amplitude: 4.4 mV
Lead Channel Sensing Intrinsic Amplitude: 4.5 mV
Lead Channel Setting Pacing Amplitude: 2 V
Lead Channel Setting Pacing Amplitude: 2.5 V
Lead Channel Setting Pacing Pulse Width: 0.8 ms
Lead Channel Setting Sensing Sensitivity: 1.5 mV
Pulse Gen Model: 2110
Pulse Gen Serial Number: 2285953

## 2015-05-04 DIAGNOSIS — H353132 Nonexudative age-related macular degeneration, bilateral, intermediate dry stage: Secondary | ICD-10-CM | POA: Diagnosis not present

## 2015-05-04 LAB — HM DIABETES EYE EXAM

## 2015-05-05 ENCOUNTER — Encounter: Payer: Self-pay | Admitting: Cardiology

## 2015-05-05 ENCOUNTER — Telehealth: Payer: Self-pay | Admitting: *Deleted

## 2015-05-05 NOTE — Telephone Encounter (Signed)
Husband called in, pt saw eye doctor yesterday. Her vision is still blurry. Instructed him to call their office regarding this.  The eye doctor checked her blood sugar at the visit yesterday and it was 406. [name] checked it this morning when she got up before eating it was 240. Pt is not currently on medication. He is concerned that her sugar levels are increasing. Instructed him to check her sugars at least twice a day & keep the a log. Please advise on any further instructions.

## 2015-05-05 NOTE — Telephone Encounter (Signed)
Please have him check blood sugars frequently before meals and at bedtime and 2 hours after meals. Bring these readings in early next week and visit with the clinical pharmacist and follow her recommendations about restarting treatment for diabetes. In the meantime make sure that she watches her diet more closely and gets his much exercise as possible and drink as much water as possible

## 2015-05-10 ENCOUNTER — Other Ambulatory Visit: Payer: Self-pay | Admitting: Family Medicine

## 2015-05-12 ENCOUNTER — Telehealth: Payer: Self-pay | Admitting: Family Medicine

## 2015-05-12 MED ORDER — METFORMIN HCL ER 500 MG PO TB24: 500.0000 mg | ORAL_TABLET | Freq: Two times a day (BID) | ORAL | Status: DC

## 2015-05-12 NOTE — Telephone Encounter (Signed)
There is nothing to adjust patient is not taking anything for diabetes.  She needs to start metformin 500mg  1 tablet bid with food Make sure she comes to appt 05/17/15

## 2015-05-12 NOTE — Telephone Encounter (Signed)
BS readings  3/15- 240- 11am, 264 7:55pm, 303 8pm, 268 12am 3/16- 210 11am, 300, 217 before super 7:15pm, 182 after supper 3/17- 201 at breakfast, 251 after breakfast, 152 before supper, 172 after supper 3/18 274 before breakfast, 363 after breakfast, 365 before supper, 395 after supper, 412 bedtime  3/19- 203 before breakfast, 347 after breakfast, 309 before supper, 409 at bedtime 3/20- 233 before breakfast, 305 after breakfast, 315 before supper, 327 after supper, 378 at bedtime 3/21- 224 before breakfast, 218 after breakfast, 167 before supper, 143 after supper, 205 at bedtime   Patient does not get up before 11am most days and the before breakfast reading was fasting. Please advise

## 2015-05-12 NOTE — Telephone Encounter (Signed)
I thought patient had already seen the clinical pharmacists about her blood sugar that she was taking something for this. Her creatinine was not elevated. As per clinical pharmacy and I agree start metformin 500 twice daily and keep appointment that is scheduled already with clinical pharmacy. Bring blood sugar readings in for review at that time.

## 2015-05-12 NOTE — Telephone Encounter (Signed)
Forward to clinical pharmacy to help adjust medications and make sure patient has an appointment to come back and see her at the appropriate time per clinical pharmacist to help get better blood sugar control

## 2015-05-12 NOTE — Telephone Encounter (Signed)
Pt husband aware

## 2015-05-13 ENCOUNTER — Other Ambulatory Visit: Payer: Self-pay | Admitting: Family Medicine

## 2015-05-17 ENCOUNTER — Ambulatory Visit (INDEPENDENT_AMBULATORY_CARE_PROVIDER_SITE_OTHER): Payer: Medicare Other | Admitting: Pharmacist

## 2015-05-17 ENCOUNTER — Encounter: Payer: Self-pay | Admitting: Pharmacist

## 2015-05-17 VITALS — BP 134/74 | HR 70

## 2015-05-17 DIAGNOSIS — E119 Type 2 diabetes mellitus without complications: Secondary | ICD-10-CM

## 2015-05-17 NOTE — Patient Instructions (Signed)
Diabetes and Standards of Medical Care   Diabetes is complicated. You may find that your diabetes team includes a dietitian, nurse, diabetes educator, eye doctor, and more. To help everyone know what is going on and to help you get the care you deserve, the following schedule of care was developed to help keep you on track. Below are the tests, exams, vaccines, medicines, education, and plans you will need.  Blood Glucose Goals Prior to meals = 80 - 130 Within 2 hours of the start of a meal = less than 180  HbA1c test (goal is less than 7.0% - your last value was 7.6%) This test shows how well you have controlled your glucose over the past 2 to 3 months. It is used to see if your diabetes management plan needs to be adjusted.   It is performed at least 2 times a year if you are meeting treatment goals.  It is performed 4 times a year if therapy has changed or if you are not meeting treatment goals.  Blood pressure test  This test is performed at every routine medical visit. The goal is less than 140/90 mmHg for most people, but 130/80 mmHg in some cases. Ask your health care provider about your goal.  Dental exam  Follow up with the dentist regularly.  Eye exam  If you are diagnosed with type 1 diabetes as a child, get an exam upon reaching the age of 10 years or older and have had diabetes for 3 to 5 years. Yearly eye exams are recommended after that initial eye exam.  If you are diagnosed with type 1 diabetes as an adult, get an exam within 5 years of diagnosis and then yearly.  If you are diagnosed with type 2 diabetes, get an exam as soon as possible after the diagnosis and then yearly.  Foot care exam  Visual foot exams are performed at every routine medical visit. The exams check for cuts, injuries, or other problems with the feet.  A comprehensive foot exam should be done yearly. This includes visual inspection as well as assessing foot pulses and testing for loss of  sensation.  Check your feet nightly for cuts, injuries, or other problems with your feet. Tell your health care provider if anything is not healing.  Kidney function test (urine microalbumin)  This test is performed once a year.  Type 1 diabetes: The first test is performed 5 years after diagnosis.  Type 2 diabetes: The first test is performed at the time of diagnosis.  A serum creatinine and estimated glomerular filtration rate (eGFR) test is done once a year to assess the level of chronic kidney disease (CKD), if present.  Lipid profile (cholesterol, HDL, LDL, triglycerides)  Performed every 5 years for most people.  The goal for LDL is less than 100 mg/dL. If you are at high risk, the goal is less than 70 mg/dL.  The goal for HDL is 40 mg/dL to 50 mg/dL for men and 50 mg/dL to 60 mg/dL for women. An HDL cholesterol of 60 mg/dL or higher gives some protection against heart disease.  The goal for triglycerides is less than 150 mg/dL.  Influenza vaccine, pneumococcal vaccine, and hepatitis B vaccine  The influenza vaccine is recommended yearly.  The pneumococcal vaccine is generally given once in a lifetime. However, there are some instances when another vaccination is recommended. Check with your health care provider.  The hepatitis B vaccine is also recommended for adults with diabetes.    Diabetes self-management education  Education is recommended at diagnosis and ongoing as needed.  Treatment plan  Your treatment plan is reviewed at every medical visit.  Document Released: 12/04/2008 Document Revised: 10/09/2012 Document Reviewed: 07/09/2012 ExitCare Patient Information 2014 [employer].   

## 2015-05-17 NOTE — Progress Notes (Signed)
Patient ID: [name], female   DOB: 08-03-1945, 70 y.o.   MRN: [id number]  Subjective:    [name] is a 70 y.o. female who presents for an initial evaluation of Type 2 diabetes mellitus.   She had an A1c of 7.6% (03/19/2015)   Last week her husband called and BG was very elevated 300's.  She started taking metformin XR 500mg  bid and in just 3 days BG improved to 114 to 199.   Known diabetic complications: cerebrovascular disease Cardiovascular risk factors: history of CVA   Eye exam current (within one year): yes Weight trend: stable Prior visit with dietician: no Current diet: in general, an "unhealthy" diet Current exercise: none  Current monitoring regimen: home blood tests - 1 to 2  times daily Home blood sugar records: 114 to 199 over last 4 days.  Previously was in 300's Any episodes of hypoglycemia? no  Is She on ACE inhibitor or angiotensin II receptor blocker?  No  The following portions of the patient's history were reviewed and updated as appropriate: allergies, current medications, past medical history and problem list.    Objective:    BP 134/74 mmHg  Pulse 70  A1c = 7.6% (03/16/2015) Lab Review GLUCOSE (mg/dL)  Date Value  03/19/2015 159*  12/02/2014 70  07/17/2014 101*   GLUCOSE, BLD (mg/dL)  Date Value  03/24/2013 154*  07/05/2012 132*  06/27/2012 116*   CO2 (mmol/L)  Date Value  03/19/2015 23  12/02/2014 29  07/17/2014 25   BUN (mg/dL)  Date Value  03/19/2015 8  12/02/2014 13  07/17/2014 12  03/24/2013 18  07/05/2012 11  06/27/2012 12   CREAT (mg/dL)  Date Value  06/27/2012 0.63   CREATININE, SER (mg/dL)  Date Value  03/19/2015 0.56*  12/02/2014 0.53*  07/17/2014 0.51*    Assessment:    Diabetes Mellitus type II, under improving control.    Plan:    1.  Rx changes: none 2.  Education: Reviewed 'ABCs' of diabetes management (respective goals in parentheses):  A1C (<7), blood pressure (<130/80), and cholesterol (LDL  <100). 3.  Over 30 minutes spent discussed CHO counting diet, recommended serving sizes and increasing low CHO foods.   4.  Physical activity as able (right sided paralysis post CVA) 5.  RTC in 2 months to check A1c and BG control  Cherre Robins, PharmD, CPP, CDE

## 2015-05-19 ENCOUNTER — Encounter: Payer: Self-pay | Admitting: Cardiology

## 2015-05-20 DIAGNOSIS — H353133 Nonexudative age-related macular degeneration, bilateral, advanced atrophic without subfoveal involvement: Secondary | ICD-10-CM | POA: Diagnosis not present

## 2015-05-21 ENCOUNTER — Encounter: Payer: Medicare Other | Admitting: *Deleted

## 2015-05-24 ENCOUNTER — Telehealth: Payer: Self-pay | Admitting: Family Medicine

## 2015-05-24 MED ORDER — NYSTATIN 100000 UNIT/GM EX POWD: CUTANEOUS | Status: DC

## 2015-05-24 NOTE — Telephone Encounter (Signed)
Please try some nystatin powder beneath the breast and make sure that she does not wear the bra, and have them call us in a couple days if this is not helping

## 2015-05-24 NOTE — Telephone Encounter (Signed)
Patient has a rash under her breast that was caused from the under wire from her bra. Patients husband wants to know if something can be called in. Patient is currently using a triamcinolone cream and is not helping. Patients husband aware that you are out of the office today and requested that I send you the message. Patients husband also wanted you yo know that she will be having cataract removed.

## 2015-05-24 NOTE — Telephone Encounter (Signed)
Patient's husband aware °

## 2015-05-26 DIAGNOSIS — H353131 Nonexudative age-related macular degeneration, bilateral, early dry stage: Secondary | ICD-10-CM | POA: Diagnosis not present

## 2015-05-27 ENCOUNTER — Telehealth: Payer: Self-pay | Admitting: Family Medicine

## 2015-05-27 ENCOUNTER — Other Ambulatory Visit: Payer: Self-pay | Admitting: Family Medicine

## 2015-05-27 NOTE — Telephone Encounter (Signed)
Will probably need to bring patient in for someone to look at this irritation and possibly do a culture

## 2015-05-27 NOTE — Telephone Encounter (Signed)
Spoke with patient's husband.   Patient has been using nystatin powder for rash under breast.  Patient's husband states that rash is not getting any better.

## 2015-05-27 NOTE — Telephone Encounter (Signed)
Appointment made to se MMM 4/7 at 4:30.

## 2015-05-28 ENCOUNTER — Ambulatory Visit (INDEPENDENT_AMBULATORY_CARE_PROVIDER_SITE_OTHER): Payer: Medicare Other | Admitting: Nurse Practitioner

## 2015-05-28 ENCOUNTER — Encounter: Payer: Self-pay | Admitting: Nurse Practitioner

## 2015-05-28 VITALS — BP 134/79 | HR 67 | Temp 96.9°F | Ht 64.0 in | Wt 176.0 lb

## 2015-05-28 DIAGNOSIS — B372 Candidiasis of skin and nail: Secondary | ICD-10-CM | POA: Diagnosis not present

## 2015-05-28 DIAGNOSIS — I6349 Cerebral infarction due to embolism of other cerebral artery: Secondary | ICD-10-CM

## 2015-05-28 MED ORDER — MICONAZOLE NITRATE 2 % EX CREA
1.0000 | TOPICAL_CREAM | Freq: Two times a day (BID) | CUTANEOUS | Status: DC
Start: 2015-05-28 — End: 2016-07-19

## 2015-05-28 MED ORDER — FLUCONAZOLE 150 MG PO TABS: ORAL_TABLET | ORAL | Status: DC

## 2015-05-28 NOTE — Progress Notes (Signed)
   Subjective:    Patient ID: [name], female    DOB: Aug 21, 1945, 70 y.o.   MRN: [id number]  HPI Patient comes in with her husband with c/o unresolving rash. SHe has had a rash under her breast for several years- lately has been getting worse- dr. Laurance Flatten told her to not wear a bra at home and let area dry out- [facility] was given triamcinolone cream and she uses it every day.   Review of Systems  Constitutional: Negative.   HENT: Negative.   Respiratory: Negative.   Cardiovascular: Negative.   Genitourinary: Negative.   Neurological: Negative.   Psychiatric/Behavioral: Negative.   All other systems reviewed and are negative.      Objective:   Physical Exam  Constitutional: She appears well-developed and well-nourished.  Cardiovascular: Normal rate, regular rhythm and normal heart sounds.   Pulmonary/Chest: Effort normal and breath sounds normal.  Neurological: She is alert.  Right sided weakness secondary to CVA 5 years ago.  Skin: Skin is warm.  Psychiatric: She has a normal mood and affect. Her behavior is normal. Judgment and thought content normal.   BP 134/79 mmHg  Pulse 67  Temp(Src) 96.9 F (36.1 C) (Oral)  Ht 5\' 4"  (1.626 m)  Wt 176 lb (79.833 kg)  BMI 30.20 kg/m2        Assessment & Plan:   1. Cutaneous candidiasis    Meds ordered this encounter  Medications  . miconazole (MICOTIN) 2 % cream    Sig: Apply 1 application topically 2 (two) times daily.    Dispense:  28.35 g    Refill:  0    Order Specific Question:  Supervising Provider    Answer:  Chipper Herb [[id number]]  . fluconazole (DIFLUCAN) 150 MG tablet    Sig: 1 po q week x 4 weeks    Dispense:  4 tablet    Refill:  0    Order Specific Question:  Supervising Provider    Answer:  Chipper Herb [[id number]]   Stop triamcinolone cream for now Need to keep area as dry as possible-  Dry area with hair dryer before pplying creams and powders RTO prn  Mary-Margaret Hassell Done, FNP

## 2015-05-28 NOTE — Patient Instructions (Signed)
Cutaneous Candidiasis °Cutaneous candidiasis is a condition in which there is an overgrowth of yeast (candida) on the skin. Yeast normally live on the skin, but in small enough numbers not to cause any symptoms. In certain cases, increased growth of the yeast may cause an actual yeast infection. This kind of infection usually occurs in areas of the skin that are constantly warm and moist, such as the armpits or the groin. Yeast is the most common cause of diaper rash in babies and in people who cannot control their bowel movements (incontinence). °CAUSES  °The fungus that most often causes cutaneous candidiasis is Candida albicans. Conditions that can increase the risk of getting a yeast infection of the skin include: °· Obesity. °· Pregnancy. °· Diabetes. °· Taking antibiotic medicine. °· Taking birth control pills. °· Taking steroid medicines. °· Thyroid disease. °· An iron or zinc deficiency. °· Problems with the immune system. °SYMPTOMS  °· Red, swollen area of the skin. °· Bumps on the skin. °· Itchiness. °DIAGNOSIS  °The diagnosis of cutaneous candidiasis is usually based on its appearance. Light scrapings of the skin may also be taken and viewed under a microscope to identify the presence of yeast. °TREATMENT  °Antifungal creams may be applied to the infected skin. In severe cases, oral medicines may be needed.  °HOME CARE INSTRUCTIONS  °· Keep your skin clean and dry. °· Maintain a healthy weight. °· If you have diabetes, keep your blood sugar under control. °SEEK IMMEDIATE MEDICAL CARE IF: °· Your rash continues to spread despite treatment. °· You have a fever, chills, or abdominal pain. °  °This information is not intended to replace advice given to you by your health care provider. Make sure you discuss any questions you have with your health care provider. °  °Document Released: 10/25/2010 Document Revised: 05/01/2011 Document Reviewed: 08/10/2014 °Elsevier Interactive Patient Education ©2016 Elsevier  Inc. ° °

## 2015-06-07 DIAGNOSIS — K432 Incisional hernia without obstruction or gangrene: Secondary | ICD-10-CM | POA: Diagnosis not present

## 2015-06-16 ENCOUNTER — Other Ambulatory Visit: Payer: Self-pay

## 2015-06-16 MED ORDER — METFORMIN HCL ER 500 MG PO TB24: 500.0000 mg | ORAL_TABLET | Freq: Two times a day (BID) | ORAL | Status: DC

## 2015-06-17 ENCOUNTER — Other Ambulatory Visit: Payer: Self-pay | Admitting: Family Medicine

## 2015-06-25 ENCOUNTER — Other Ambulatory Visit: Payer: Self-pay | Admitting: Family Medicine

## 2015-06-30 ENCOUNTER — Encounter: Payer: Medicare Other | Admitting: Nurse Practitioner

## 2015-07-02 ENCOUNTER — Encounter: Payer: Self-pay | Admitting: Internal Medicine

## 2015-07-02 ENCOUNTER — Ambulatory Visit (INDEPENDENT_AMBULATORY_CARE_PROVIDER_SITE_OTHER): Payer: Medicare Other | Admitting: Internal Medicine

## 2015-07-02 VITALS — BP 134/78 | HR 90 | Ht 64.0 in | Wt 175.6 lb

## 2015-07-02 DIAGNOSIS — I480 Paroxysmal atrial fibrillation: Secondary | ICD-10-CM

## 2015-07-02 DIAGNOSIS — I495 Sick sinus syndrome: Secondary | ICD-10-CM | POA: Diagnosis not present

## 2015-07-02 DIAGNOSIS — I6349 Cerebral infarction due to embolism of other cerebral artery: Secondary | ICD-10-CM

## 2015-07-02 LAB — CUP PACEART INCLINIC DEVICE CHECK
Battery Remaining Longevity: 114
Battery Voltage: 2.93 V
Brady Statistic RA Percent Paced: 35 %
Brady Statistic RV Percent Paced: 3.1 %
Date Time Interrogation Session: 20170512171021
Implantable Lead Implant Date: 20100630
Implantable Lead Implant Date: 20100630
Implantable Lead Location: 753859
Implantable Lead Location: 753860
Lead Channel Impedance Value: 425 Ohm
Lead Channel Impedance Value: 437.5 Ohm
Lead Channel Pacing Threshold Amplitude: 1 V
Lead Channel Pacing Threshold Amplitude: 1 V
Lead Channel Pacing Threshold Amplitude: 1 V
Lead Channel Pacing Threshold Amplitude: 1 V
Lead Channel Pacing Threshold Pulse Width: 0.4 ms
Lead Channel Pacing Threshold Pulse Width: 0.4 ms
Lead Channel Pacing Threshold Pulse Width: 0.8 ms
Lead Channel Pacing Threshold Pulse Width: 0.8 ms
Lead Channel Sensing Intrinsic Amplitude: 5 mV
Lead Channel Sensing Intrinsic Amplitude: 5.6 mV
Lead Channel Setting Pacing Amplitude: 2 V
Lead Channel Setting Pacing Amplitude: 2.5 V
Lead Channel Setting Pacing Pulse Width: 0.8 ms
Lead Channel Setting Sensing Sensitivity: 1.5 mV
Pulse Gen Model: 2110
Pulse Gen Serial Number: 2285953

## 2015-07-02 NOTE — Progress Notes (Signed)
HPI   [name]  returns today for PM followup. She is a very pleasant 70 year old woman with a history of paroxysmal atrial fibrillation, symptomatic tachycardia bradycardia syndrome, status post permanent pacemaker insertion. She was treated with multiple antiarrhythmic drugs and has over the years had worsening atrial fibrillation. She underwent atrial fib ablation but continued to have some symptomatic atrial fib and undewent convergence ablation procedure at [facility] and suffered a major stroke resulting in left HP and dense expressive aphasia. She has had some trouble with swallowing despite having undergone extensive rehab. She has not been hospitalized with pneumonia however. Allergies  Allergen Reactions  . Latex Rash  . Caudal Tray [Lidocaine-Epinephrine]     Edema    . Ciprofloxacin Hcl Nausea Only  . Codeine Nausea Only  . Diovan [Valsartan] Itching  . Doxycycline Other (See Comments)    Might have caused heart to race per pt - not sure  . Esomeprazole Magnesium     Headache    . Levofloxacin     Insomnia    . Myrbetriq [Mirabegron] Diarrhea  . Neomycin-Bacitracin Zn-Polymyx     rash  . Penicillins     hives  . Sulfa Drugs Cross Reactors Other (See Comments)    Pt not sure what reaction was  . Verapamil     Edema   . Vimovo [Naproxen-Esomeprazole]     Upset stomach      Current Outpatient Prescriptions  Medication Sig Dispense Refill  . acetaminophen (TYLENOL) 500 MG tablet Take 500 mg by mouth every 6 (six) hours as needed (pain).     . Ascorbic Acid (VITAMIN C PO) Take 1 tablet by mouth daily.    . Calcium Carbonate-Vitamin D (CALCIUM-VITAMIN D) 500-200 MG-UNIT per tablet Take 1 tablet by mouth daily.    . cholecalciferol (VITAMIN D) 1000 UNITS tablet Take 1,000 Units by mouth daily.    . citalopram (CELEXA) 20 MG tablet TAKE 1 TABLET(20 MG) BY MOUTH TWICE DAILY 60 tablet 2  . dicyclomine (BENTYL) 10 MG capsule TAKE 1 CAPSULE BY MOUTH FOUR TIMES DAILY BEFORE MEALS  AND AT BEDTIME 120 capsule 1  . fluconazole (DIFLUCAN) 150 MG tablet 1 po q week x 4 weeks 4 tablet 0  . fluticasone (FLONASE) 50 MCG/ACT nasal spray Place 2 sprays into both nostrils daily. 16 g 2  . levETIRAcetam (KEPPRA) 500 MG tablet TAKE 1 TABLET BY MOUTH TWICE DAILY 60 tablet 11  . levothyroxine (SYNTHROID, LEVOTHROID) 88 MCG tablet TAKE 1 TABLET BY MOUTH EVERY DAY BEFORE BREAKFAST 30 tablet 11  . loperamide (IMODIUM) 2 MG capsule Take by mouth as needed for diarrhea or loose stools. Reported on 05/17/2015    . metFORMIN (GLUCOPHAGE XR) 500 MG 24 hr tablet Take 1 tablet (500 mg total) by mouth 2 (two) times daily after a meal. 60 tablet 2  . methotrexate (RHEUMATREX) 2.5 MG tablet Take 4 tablets (10 mg total) by mouth once a week. Caution:Chemotherapy. Protect from light. 16 tablet 1  . metoprolol (LOPRESSOR) 50 MG tablet Take 1 tablet (50 mg total) by mouth 2 (two) times daily. 180 tablet 3  . miconazole (MICOTIN) 2 % cream Apply 1 application topically 2 (two) times daily. 28.35 g 0  . mirtazapine (REMERON) 15 MG tablet Take 15 mg by mouth at bedtime.    . Multiple Vitamins-Minerals (EYE VITAMINS PO) Take 2 tablets by mouth daily.     Marland Kitchen nystatin (MYCOSTATIN/NYSTOP) 100000 UNIT/GM POWD Apply BID underneath breast 60 g 0  .  omeprazole (PRILOSEC) 40 MG capsule TAKE ONE CAPSULE BY MOUTH EVERY DAY 30 capsule 5  . ONE TOUCH ULTRA TEST test strip TEST BLOOD SUGAR ONCE A DAY AS NEEDED 100 each 5  . potassium chloride SA (K-DUR,KLOR-CON) 20 MEQ tablet TAKE 2 TABLETS BY MOUTH TWICE DAILY 120 tablet 0  . prednisoLONE acetate (PRED FORTE) 1 % ophthalmic suspension Place 1 drop into both eyes as directed.    . Probiotic Product (PROBIOTIC PO) Take 1 capsule by mouth daily.    . rivaroxaban (XARELTO) 20 MG TABS tablet Take 1 tablet (20 mg total) by mouth daily. 30 tablet 5  . simvastatin (ZOCOR) 40 MG tablet Take 1 tablet (40 mg total) by mouth daily. 30 tablet 5  . traMADol (ULTRAM) 50 MG tablet Take  1/2 tab QID PRN for severe pain. 28 tablet 0   No current facility-administered medications for this visit.     Past Medical History  Diagnosis Date  . Impaired glucose tolerance   . Psoriasis   . IBS (irritable bowel syndrome)     vs diarrhea vs abd. fullness   . Chronic anxiety   . Uterine prolapse   . Bronchial spasms   . Hypertension   . Esophageal stricture   . Elevated blood sugar   . Diverticulosis   . Rectocele, female   . History of bladder repair surgery   . Hx of vaginal hysterectomy   . Vaginal prolapse 1998  . Hypothyroidism   . Psoriasis   . Tachy-brady syndrome ([place])     a. 08/19/2008 s/p PPM: SJM 2110 Accent  . Complication of anesthesia     " I shake real bad "  . Family history of anesthesia complication     Daughter also shakes while waking up  . GERD (gastroesophageal reflux disease)   . H/O hiatal hernia   . Neuromuscular disorder ([place])     periferal neuropathy  . Anemia   . Paroxysmal atrial fibrillation ([facility])     a. failed flecainide, tikosyn, amio;  b. 01/2012 s/p RFCA.  . H/O scarlet fever   . Pacemaker     St. Jude    ROS:   All systems reviewed and negative except as noted in the HPI.   Past Surgical History  Procedure Laterality Date  . Vaginal hysterectomy      prolapse   . Bladder suspension    . Rectocele repair    . Transthoracic echocardiogram  2008  . Tee without cardioversion  02/06/2012    Procedure: TRANSESOPHAGEAL ECHOCARDIOGRAM (TEE);  Surgeon: Thayer Headings, MD;  Location: [facility] ENDOSCOPY;  Service: Cardiovascular;  Laterality: N/A;  . Atrial fibrillation ablation  02/06/12    PVI by Dr Rayann Heman  . Insert / replace / remove pacemaker      SJM  . Atrial fibrillation ablation  07/04/2012    repeat PVI by Dr Rayann Heman  . Tee without cardioversion N/A 07/04/2012    Procedure: TRANSESOPHAGEAL ECHOCARDIOGRAM (TEE);  Surgeon: Lelon Perla, MD;  Location: [facility] ENDOSCOPY;  Service: Cardiovascular;  Laterality: N/A;  . Atrial  fibrillation ablation N/A 02/06/2012    Procedure: ATRIAL FIBRILLATION ABLATION;  Surgeon: Thompson Grayer, MD;  Location: [facility] CATH LAB;  Service: Cardiovascular;  Laterality: N/A;  . Atrial fibrillation ablation N/A 07/04/2012    Procedure: ATRIAL FIBRILLATION ABLATION;  Surgeon: Thompson Grayer, MD;  Location: [facility] CATH LAB;  Service: Cardiovascular;  Laterality: N/A;  . Left heart catheterization with coronary angiogram N/A 03/24/2013  Procedure: LEFT HEART CATHETERIZATION WITH CORONARY ANGIOGRAM;  Surgeon: Blane Ohara, MD;  Location: [facility] CATH LAB;  Service: Cardiovascular;  Laterality: N/A;     Family History  Problem Relation Age of Onset  . Breast cancer Sister   . Ovarian cancer Sister   . Uterine cancer Paternal Aunt   . Crohn's disease Other     neice  . Diabetes Mother   . Heart disease Mother   . Diabetes Maternal Grandmother   . Diabetes Sister   . Heart disease Father   . Heart disease Brother   . Heart disease Brother   . Colon cancer Neg Hx   . Stomach cancer Neg Hx      Social History   Social History  . Marital Status: Married    Spouse Name: [name]"  . Number of Children: 2  . Years of Education: 12+   Occupational History  . ADMINISTRATION    Social History Main Topics  . Smoking status: Never Smoker   . Smokeless tobacco: Never Used  . Alcohol Use: No  . Drug Use: No  . Sexual Activity: Yes   Other Topics Concern  . Not on file   Social History Narrative   Lives in [place].     Caffeine use: drinks coffee/tea: 2 cups coffee in the morning, drinks 1 glass tea per day         BP 134/78 mmHg  Pulse 90  Ht 5\' 4"  (1.626 m)  Wt 175 lb 9.6 oz (79.652 kg)  BMI 30.13 kg/m2  SpO2 96%  Physical Exam:  stable appearing middle-aged woman, wearing a left arm sling, NAD HEENT: Unremarkable Neck:  6 cm JVD, no thyromegally Lungs:  Clear with no wheezes, rales, or rhonchi.  HEART:  Regular rate rhythm, no murmurs, no rubs, no clicks Abd:   soft, positive bowel sounds, no organomegally, no rebound, no guarding Ext:  2 plus pulses, no edema, no cyanosis, no clubbing Skin:  No rashes no nodules Neuro:  Dense right HP and expressive aphasia   DEVICE  Normal device function.  See PaceArt for details.   Assess/Plan:  1. PAF - on device interogation, she is in NSR 99% of the time. Continue her current meds. 2. Symptomatic tachy-brady - she is asymptomatic 3. Coags -she is tolerating her systemic anti-coagulation without complication. 4. PPM - her St. Jude DDD PM is working normally.  Mikle Bosworth.D.

## 2015-07-02 NOTE — Patient Instructions (Signed)
Medication Instructions:  Your physician recommends that you continue on your current medications as directed. Please refer to the Current Medication list given to you today.   Labwork: None ordered   Testing/Procedures: None ordered   Follow-Up: Your physician wants you to follow-up in: 12 months with Dr Taylor You will receive a reminder letter in the mail two months in advance. If you don't receive a letter, please call our office to schedule the follow-up appointment.   Remote monitoring is used to monitor your Pacemaker  from home. This monitoring reduces the number of office visits required to check your device to one time per year. It allows us to keep an eye on the functioning of your device to ensure it is working properly. You are scheduled for a device check from home on 10/04/15. You may send your transmission at any time that day. If you have a wireless device, the transmission will be sent automatically. After your physician reviews your transmission, you will receive a postcard with your next transmission date.    Any Other Special Instructions Will Be Listed Below (If Applicable).     If you need a refill on your cardiac medications before your next appointment, please call your pharmacy.   

## 2015-07-07 ENCOUNTER — Telehealth: Payer: Self-pay | Admitting: Family Medicine

## 2015-07-07 MED ORDER — AZITHROMYCIN 250 MG PO TABS: ORAL_TABLET | ORAL | Status: DC

## 2015-07-07 NOTE — Telephone Encounter (Signed)
Please try to define what he means by the flu. Did he have bronchitis or DD asked to have the flu. If he had the flu she would need Tamiflu to take one twice daily. If she has bronchitis she would probably do with a Z-Pak.

## 2015-07-07 NOTE — Telephone Encounter (Signed)
Symptoms includes:  (no FLU diagnosis for husband)  Head congestion Runny nose Chills / slight fever Cough - chest congestion Sneezing  Zpak called in  Husband aware

## 2015-07-08 ENCOUNTER — Other Ambulatory Visit: Payer: Self-pay | Admitting: Family Medicine

## 2015-07-14 ENCOUNTER — Ambulatory Visit (INDEPENDENT_AMBULATORY_CARE_PROVIDER_SITE_OTHER): Payer: Medicare Other

## 2015-07-14 ENCOUNTER — Encounter: Payer: Self-pay | Admitting: Family Medicine

## 2015-07-14 ENCOUNTER — Ambulatory Visit (INDEPENDENT_AMBULATORY_CARE_PROVIDER_SITE_OTHER): Payer: Medicare Other | Admitting: Family Medicine

## 2015-07-14 VITALS — BP 118/76 | HR 69 | Temp 96.9°F | Ht 64.0 in | Wt 177.0 lb

## 2015-07-14 DIAGNOSIS — I10 Essential (primary) hypertension: Secondary | ICD-10-CM

## 2015-07-14 DIAGNOSIS — I480 Paroxysmal atrial fibrillation: Secondary | ICD-10-CM

## 2015-07-14 DIAGNOSIS — E559 Vitamin D deficiency, unspecified: Secondary | ICD-10-CM | POA: Diagnosis not present

## 2015-07-14 DIAGNOSIS — E785 Hyperlipidemia, unspecified: Secondary | ICD-10-CM | POA: Diagnosis not present

## 2015-07-14 DIAGNOSIS — Z1211 Encounter for screening for malignant neoplasm of colon: Secondary | ICD-10-CM

## 2015-07-14 DIAGNOSIS — I6349 Cerebral infarction due to embolism of other cerebral artery: Secondary | ICD-10-CM

## 2015-07-14 DIAGNOSIS — G40109 Localization-related (focal) (partial) symptomatic epilepsy and epileptic syndromes with simple partial seizures, not intractable, without status epilepticus: Secondary | ICD-10-CM

## 2015-07-14 DIAGNOSIS — E119 Type 2 diabetes mellitus without complications: Secondary | ICD-10-CM | POA: Diagnosis not present

## 2015-07-14 DIAGNOSIS — E034 Atrophy of thyroid (acquired): Secondary | ICD-10-CM | POA: Diagnosis not present

## 2015-07-14 DIAGNOSIS — E038 Other specified hypothyroidism: Secondary | ICD-10-CM | POA: Diagnosis not present

## 2015-07-14 MED ORDER — METFORMIN HCL ER 500 MG PO TB24: 500.0000 mg | ORAL_TABLET | Freq: Two times a day (BID) | ORAL | Status: DC

## 2015-07-14 NOTE — Patient Instructions (Addendum)
Medicare Annual Wellness Visit  [facility] and the medical providers at [facility] strive to bring you the best medical care.  In doing so we not only want to address your current medical conditions and concerns but also to detect new conditions early and prevent illness, disease and health-related problems.    Medicare offers a yearly Wellness Visit which allows our clinical staff to assess your need for preventative services including immunizations, lifestyle education, counseling to decrease risk of preventable diseases and screening for fall risk and other medical concerns.    This visit is provided free of charge (no copay) for all Medicare recipients. The clinical pharmacists at [facility] have begun to conduct these Wellness Visits which will also include a thorough review of all your medications.    As you primary medical provider recommend that you make an appointment for your Annual Wellness Visit if you have not done so already this year.  You may set up this appointment before you leave today or you may call back [phone]) and schedule an appointment.  Please make sure when you call that you mention that you are scheduling your Annual Wellness Visit with the clinical pharmacist so that the appointment may be made for the proper length of time.     Continue current medications. Continue good therapeutic lifestyle changes which include good diet and exercise. Fall precautions discussed with patient. If an FOBT was given today- please return it to our front desk. If you are over 68 years old - you may need Prevnar 13 or the adult Pneumonia vaccine.  **Flu shots are available--- please call and schedule a FLU-CLINIC appointment**  After your visit with [place] today you will receive a survey in the mail or online from [employer] regarding your care with [place]. Please take a moment to fill this out. Your feedback is very  important to [place] as you can help [place] better understand your patient needs as well as improve your experience and satisfaction. WE CARE ABOUT YOU!!!   The patient has done extremely well. She should continue with her home physical therapy She should continue to follow-up with cardiology She should get her cataract done as planned She should continue to follow-up with neurology as planned She should be encouraged to exercise her right forearm to increase the mobility with this in doing home exercises She should keep checking her blood sugars and blood pressures as directed and bring these to each visit

## 2015-07-14 NOTE — Progress Notes (Signed)
Subjective:    Patient ID: [name], female    DOB: 05/17/1945, 71 y.o.   MRN: [id number]  HPI Pt here for follow up and management of chronic medical problems which includes hypothyroid, hyperlipidemia, and hypertension. She is taking medications regularly.The patient is doing well overall. She comes to the visit today with her husband. She is recently seen the cardiologist and he felt that everything was stable with her. She does periodically see the neurologist Dr. Elba Barman. She has seen the clinical pharmacist with diabetic education and her hemoglobin A1c is on the improving side. Home blood pressures have been good. Home blood sugars were brought in and these seem to be running in the 150-170 range on the average.She denies chest pain shortness of breath. She does not have a lot of appetite according to her husband and does not like the things that she used to like before she had the stroke. She does like to eat more sugar. He is going to try to reduce the sugar intake more and use Stevia for sweeping purposes. She does not have any trouble swallowing issues that she doesn't have an appetite for certain types of foods and a healthier foods. She has no nausea vomiting diarrhea or blood in the stool and she is passing her water without problems. She has good mobility with both legs and asked he has good grip on the right side and is able to raise the arm to almost horizontal.      Patient Active Problem List   Diagnosis Date Noted  . Type 2 diabetes mellitus ([place]) 05/17/2015  . Cerebrovascular accident (CVA) due to embolism of left middle cerebral artery ([place]) 11/24/2014  . Focal and partial seizures ([place]) 11/24/2014  . Depression 11/24/2014  . Cerebral infarction due to embolism of other cerebral artery ([place]) 09/03/2013  . Metabolic syndrome 71/69/6789  . High risk medication use 03/13/2013  . Fever, unspecified 06/05/2012  . Generalized abdominal pain 06/05/2012  . PAF (paroxysmal atrial  fibrillation) ([place]) 02/07/2012  . Tachy-brady syndrome ([place])   . Impaired glucose tolerance   . Fatigue 12/22/2009  . SNORING 12/22/2009  . OTHER CHRONIC NONALCOHOLIC LIVER DISEASE 38/11/1749  . NONSPEC ELEVATION OF LEVELS OF TRANSAMINASE/LDH 03/09/2009  . PPM-[place] 09/01/2008  . BRADYCARDIA 08/13/2008  . Hypothyroidism 06/09/2008  . HTN (hypertension) 06/09/2008  . Atrial fibrillation ([place]) 06/09/2008  . PREMATURE VENTRICULAR CONTRACTIONS 06/09/2008  . IRRITABLE BOWEL SYNDROME 06/09/2008  . PSORIASIS 06/09/2008  . EDEMA 06/09/2008  . DIVERTICULOSIS, COLON, HX OF 06/09/2008   Outpatient Encounter Prescriptions as of 07/14/2015  Medication Sig  . acetaminophen (TYLENOL) 500 MG tablet Take 500 mg by mouth every 6 (six) hours as needed (pain).   . Ascorbic Acid (VITAMIN C PO) Take 1 tablet by mouth daily.  . Calcium Carbonate-Vitamin D (CALCIUM-VITAMIN D) 500-200 MG-UNIT per tablet Take 1 tablet by mouth daily.  . cholecalciferol (VITAMIN D) 1000 UNITS tablet Take 1,000 Units by mouth daily.  . citalopram (CELEXA) 20 MG tablet TAKE 1 TABLET(20 MG) BY MOUTH TWICE DAILY  . dicyclomine (BENTYL) 10 MG capsule TAKE 1 CAPSULE BY MOUTH FOUR TIMES DAILY BEFORE MEALS AND AT BEDTIME  . fluticasone (FLONASE) 50 MCG/ACT nasal spray USE 2 SPRAYS IN EACH NOSTRIL EVERY DAY  . levETIRAcetam (KEPPRA) 500 MG tablet TAKE 1 TABLET BY MOUTH TWICE DAILY  . levothyroxine (SYNTHROID, LEVOTHROID) 88 MCG tablet TAKE 1 TABLET BY MOUTH EVERY DAY BEFORE BREAKFAST  . loperamide (IMODIUM) 2 MG capsule Take by  mouth as needed for diarrhea or loose stools. Reported on 05/17/2015  . metFORMIN (GLUCOPHAGE XR) 500 MG 24 hr tablet Take 1 tablet (500 mg total) by mouth 2 (two) times daily after a meal.  . methotrexate (RHEUMATREX) 2.5 MG tablet Take 4 tablets (10 mg total) by mouth once a week. Caution:Chemotherapy. Protect from light.  . metoprolol (LOPRESSOR) 50 MG tablet Take 1 tablet (50 mg total) by mouth 2 (two) times  daily.  . miconazole (MICOTIN) 2 % cream Apply 1 application topically 2 (two) times daily.  . mirtazapine (REMERON) 15 MG tablet Take 15 mg by mouth at bedtime.  . Multiple Vitamins-Minerals (EYE VITAMINS PO) Take 2 tablets by mouth daily.   Marland Kitchen nystatin (MYCOSTATIN/NYSTOP) 100000 UNIT/GM POWD Apply BID underneath breast  . omeprazole (PRILOSEC) 40 MG capsule TAKE ONE CAPSULE BY MOUTH EVERY DAY  . ONE TOUCH ULTRA TEST test strip TEST BLOOD SUGAR ONCE A DAY AS NEEDED  . potassium chloride SA (K-DUR,KLOR-CON) 20 MEQ tablet TAKE 2 TABLETS BY MOUTH TWICE DAILY  . prednisoLONE acetate (PRED FORTE) 1 % ophthalmic suspension Place 1 drop into both eyes as directed.  . Probiotic Product (PROBIOTIC PO) Take 1 capsule by mouth daily.  . rivaroxaban (XARELTO) 20 MG TABS tablet Take 1 tablet (20 mg total) by mouth daily.  . simvastatin (ZOCOR) 40 MG tablet Take 1 tablet (40 mg total) by mouth daily.  . traMADol (ULTRAM) 50 MG tablet Take 1/2 tab QID PRN for severe pain.  . [DISCONTINUED] azithromycin (ZITHROMAX) 250 MG tablet As directed  . [DISCONTINUED] fluconazole (DIFLUCAN) 150 MG tablet 1 po q week x 4 weeks   No facility-administered encounter medications on file as of 07/14/2015.      Review of Systems  Constitutional: Negative.   HENT: Negative.   Eyes: Negative.   Respiratory: Negative.   Cardiovascular: Negative.   Gastrointestinal: Negative.   Endocrine: Negative.   Genitourinary: Negative.   Musculoskeletal: Negative.   Skin: Negative.        Left breast rash  Allergic/Immunologic: Negative.   Neurological: Negative.   Hematological: Negative.   Psychiatric/Behavioral: Negative.        Objective:   Physical Exam  Constitutional: She is oriented to person, place, and time. She appears well-developed and well-nourished. No distress.  HENT:  Head: Normocephalic and atraumatic.  Right Ear: External ear normal.  Left Ear: External ear normal.  Nose: Nose normal.  Mouth/Throat:  Oropharynx is clear and moist.  Eyes: Conjunctivae and EOM are normal. Pupils are equal, round, and reactive to light. Right eye exhibits no discharge. Left eye exhibits no discharge. No scleral icterus.  Neck: Normal range of motion. Neck supple. No JVD present. No thyromegaly present.  Bruits or thyromegaly are not present  Cardiovascular: Normal rate, regular rhythm and intact distal pulses.   No murmur heard. Heart is regular at 72/m  Pulmonary/Chest: Effort normal and breath sounds normal. No respiratory distress. She has no wheezes. She has no rales. She exhibits no tenderness.  Clear anteriorly and posteriorly  Abdominal: Soft. Bowel sounds are normal. She exhibits no mass. There is no tenderness. There is no rebound and no guarding.  No abdominal tenderness liver or spleen enlargement  Genitourinary:  There was no rash beneath the breasts today but she is sensitive and tender on the chest wall beneath the breasts. She has a mammogram scheduled in August and if she does not tolerate the procedure we will at least get an ultrasound of her breast.  Musculoskeletal: She exhibits no edema or tenderness.  She has limited range of motion of the right upper extremity but does have good grip strength and is able to partially raise the arm on that side.  Lymphadenopathy:    She has no cervical adenopathy.  Neurological: She is alert and oriented to person, place, and time. She has normal reflexes. No cranial nerve deficit.  Skin: Skin is warm and dry. No rash noted.  Psychiatric: She has a normal mood and affect. Her behavior is normal. Judgment and thought content normal.  Nursing note and vitals reviewed.   BP 118/76 mmHg  Pulse 69  Temp(Src) 96.9 F (36.1 C) (Oral)  Ht '5\' 4"'$  (1.626 m)  Wt 177 lb (80.287 kg)  BMI 30.37 kg/m2       Assessment & Plan:  1. Type 2 diabetes mellitus without complication, without long-term current use of insulin (HCC) -[place] with aggressive therapeutic  lifestyle changes and current treatment pending results of A1c - CBC with Differential/Platelet  2. Hyperlipidemia -Continue with simvastatin pending results of cholesterol. - CBC with Differential/Platelet - Lipid panel  3. Essential hypertension -The blood pressure is good today and she will continue with current treatment - BMP8+EGFR - CBC with Differential/Platelet - Hepatic function panel - DG Chest 2 View; Future  4. Vitamin D deficiency -Continue with current treatment pending results of lab work - CBC with Differential/Platelet - VITAMIN D 25 Hydroxy (Vit-D Deficiency, Fractures)  5. PAF (paroxysmal atrial fibrillation) ([place]) -Continue with follow-up with cardiology - CBC with Differential/Platelet  6. Hypothyroidism -Continue current treatment pending results of lab work - CBC with Differential/Platelet - Thyroid Panel With TSH  7. Cerebral infarction due to embolism of other cerebral artery (HCC) -Continue with follow-up with neurology and with current physical therapy at home - CBC with Differential/Platelet  8. Special screening for malignant neoplasms, colon -Return FOBT - CBC with Differential/Platelet - Fecal occult blood, imunochemical; Future  9. Focal and partial seizures ([place]) -Continue with neurology follow-up  Meds ordered this encounter  Medications  . metFORMIN (GLUCOPHAGE XR) 500 MG 24 hr tablet    Sig: Take 1 tablet (500 mg total) by mouth 2 (two) times daily after a meal.    Dispense:  180 tablet    Refill:  3   Patient Instructions                       Medicare Annual Wellness Visit  [facility] and the medical providers at [facility] strive to bring you the best medical care.  In doing so we not only want to address your current medical conditions and concerns but also to detect new conditions early and prevent illness, disease and health-related problems.    Medicare offers a yearly Wellness Visit which allows  our clinical staff to assess your need for preventative services including immunizations, lifestyle education, counseling to decrease risk of preventable diseases and screening for fall risk and other medical concerns.    This visit is provided free of charge (no copay) for all Medicare recipients. The clinical pharmacists at [facility] have begun to conduct these Wellness Visits which will also include a thorough review of all your medications.    As you primary medical provider recommend that you make an appointment for your Annual Wellness Visit if you have not done so already this year.  You may set up this appointment before you leave today or you may call  back [phone]) and schedule an appointment.  Please make sure when you call that you mention that you are scheduling your Annual Wellness Visit with the clinical pharmacist so that the appointment may be made for the proper length of time.     Continue current medications. Continue good therapeutic lifestyle changes which include good diet and exercise. Fall precautions discussed with patient. If an FOBT was given today- please return it to our front desk. If you are over 39 years old - you may need Prevnar 65 or the adult Pneumonia vaccine.  **Flu shots are available--- please call and schedule a FLU-CLINIC appointment**  After your visit with [place] today you will receive a survey in the mail or online from [employer] regarding your care with [place]. Please take a moment to fill this out. Your feedback is very important to [place] as you can help [place] better understand your patient needs as well as improve your experience and satisfaction. WE CARE ABOUT YOU!!!   The patient has done extremely well. She should continue with her home physical therapy She should continue to follow-up with cardiology She should get her cataract done as planned She should continue to follow-up with neurology as planned She should be encouraged to  exercise her right forearm to increase the mobility with this in doing home exercises She should keep checking her blood sugars and blood pressures as directed and bring these to each visit   Arrie Senate MD

## 2015-07-15 LAB — CBC WITH DIFFERENTIAL/PLATELET
Basophils Absolute: 0 10*3/uL (ref 0.0–0.2)
Basos: 0 %
EOS (ABSOLUTE): 0.5 10*3/uL — ABNORMAL HIGH (ref 0.0–0.4)
Eos: 6 %
Hematocrit: 40.7 % (ref 34.0–46.6)
Hemoglobin: 13.2 g/dL (ref 11.1–15.9)
Immature Grans (Abs): 0.1 10*3/uL (ref 0.0–0.1)
Immature Granulocytes: 1 %
Lymphocytes Absolute: 2.8 10*3/uL (ref 0.7–3.1)
Lymphs: 30 %
MCH: 29.3 pg (ref 26.6–33.0)
MCHC: 32.4 g/dL (ref 31.5–35.7)
MCV: 90 fL (ref 79–97)
Monocytes Absolute: 0.9 10*3/uL (ref 0.1–0.9)
Monocytes: 9 %
Neutrophils Absolute: 5 10*3/uL (ref 1.4–7.0)
Neutrophils: 54 %
Platelets: 293 10*3/uL (ref 150–379)
RBC: 4.51 x10E6/uL (ref 3.77–5.28)
RDW: 14.9 % (ref 12.3–15.4)
WBC: 9.3 10*3/uL (ref 3.4–10.8)

## 2015-07-15 LAB — HEPATIC FUNCTION PANEL
ALT: 20 IU/L (ref 0–32)
AST: 33 IU/L (ref 0–40)
Albumin: 3.9 g/dL (ref 3.6–4.8)
Alkaline Phosphatase: 65 IU/L (ref 39–117)
Bilirubin Total: 0.2 mg/dL (ref 0.0–1.2)
Bilirubin, Direct: 0.12 mg/dL (ref 0.00–0.40)
Total Protein: 6.8 g/dL (ref 6.0–8.5)

## 2015-07-15 LAB — LIPID PANEL
Chol/HDL Ratio: 3.3 ratio units (ref 0.0–4.4)
Cholesterol, Total: 98 mg/dL — ABNORMAL LOW (ref 100–199)
HDL: 30 mg/dL — ABNORMAL LOW (ref >39)
LDL Calculated: 31 mg/dL (ref 0–99)
Triglycerides: 185 mg/dL — ABNORMAL HIGH (ref 0–149)
VLDL Cholesterol Cal: 37 mg/dL (ref 5–40)

## 2015-07-15 LAB — BMP8+EGFR
BUN/Creatinine Ratio: 15 (ref 12–28)
BUN: 7 mg/dL — ABNORMAL LOW (ref 8–27)
CO2: 22 mmol/L (ref 18–29)
Calcium: 9.2 mg/dL (ref 8.7–10.3)
Chloride: 102 mmol/L (ref 96–106)
Creatinine, Ser: 0.47 mg/dL — ABNORMAL LOW (ref 0.57–1.00)
GFR calc Af Amer: 118 mL/min/{1.73_m2} (ref >59)
GFR calc non Af Amer: 103 mL/min/{1.73_m2} (ref >59)
Glucose: 120 mg/dL — ABNORMAL HIGH (ref 65–99)
Potassium: 5.2 mmol/L (ref 3.5–5.2)
Sodium: 141 mmol/L (ref 134–144)

## 2015-07-15 LAB — THYROID PANEL WITH TSH
Free Thyroxine Index: 2.1 (ref 1.2–4.9)
T3 Uptake Ratio: 19 % — ABNORMAL LOW (ref 24–39)
T4, Total: 11.2 ug/dL (ref 4.5–12.0)
TSH: 1.58 u[IU]/mL (ref 0.450–4.500)

## 2015-07-15 LAB — VITAMIN D 25 HYDROXY (VIT D DEFICIENCY, FRACTURES): Vit D, 25-Hydroxy: 48.1 ng/mL (ref 30.0–100.0)

## 2015-07-17 ENCOUNTER — Other Ambulatory Visit: Payer: Self-pay | Admitting: Family Medicine

## 2015-07-20 ENCOUNTER — Ambulatory Visit (INDEPENDENT_AMBULATORY_CARE_PROVIDER_SITE_OTHER): Payer: Medicare Other | Admitting: Neurology

## 2015-07-20 ENCOUNTER — Encounter: Payer: Self-pay | Admitting: Neurology

## 2015-07-20 VITALS — BP 132/85 | HR 72 | Ht 64.0 in | Wt 176.4 lb

## 2015-07-20 DIAGNOSIS — I63412 Cerebral infarction due to embolism of left middle cerebral artery: Secondary | ICD-10-CM | POA: Diagnosis not present

## 2015-07-20 DIAGNOSIS — I6349 Cerebral infarction due to embolism of other cerebral artery: Secondary | ICD-10-CM

## 2015-07-20 NOTE — Patient Instructions (Addendum)
Remember to drink plenty of fluid, eat healthy meals and do not skip any meals. Try to eat protein with a every meal and eat a healthy snack such as fruit or nuts in between meals. Try to keep a regular sleep-wake schedule and try to exercise daily, particularly in the form of walking, 20-30 minutes a day, if you can.   As far as your medications are concerned, I would like to suggest: continue current medications  As far as diagnostic testing: none  Periodic Limb Movements of Sleep  I would like to see you back in 6 months to a year, sooner if we need to. Please call us with any interim questions, concerns, problems, updates or refill requests.   Our phone number is [phone]. We also have an after hours call service for urgent matters and there is a physician on-call for urgent questions. For any emergencies you know to call 911 or go to the nearest emergency room

## 2015-07-20 NOTE — Progress Notes (Signed)
[id number]    Provider:  Dr [name] Referring Provider: Chipper Herb, MD Primary Care Physician:  Pamela Gainer, MD   CC: Stroke  Interval update 07/20/2015; Speech is much better. Everything else is the same. Speech therapist concentrated on her muscles in her throat and she still has a difficult time swallowing but not choking on food. Husband says he wants to see her smile more. Her mood is improved. Walking is good. Right arm improve din strength, 3/5 and has some grip. She has PLMS. Discussed driving at length, she should have a formal motor vehicle evaluation before driving on the road.  Interval update 01/18/2015: Discussed recent eeg that was abnormal. She is on the keppra and appears to be well controlled. She continues to struggle with depression. I highly recommend therapy and psychiatry. Spoke with them at length about this.  IMPRESSION:  Abnormal EEG in awake state demonstrating: 1. Frequent left fronto-temporal sharp waves noted over F7 and T3 electrodes. 2. Mild left hemispheric slowing in the 6-7 Hz range. 3. No electrographic seizures are seen.  Interval update 10/19/2014:  Discussed records from [place] at length, I reviewed over 700 pages of records in total of patient's inpatient, acute rehab and subacute rehab stays. Patient's husband had a lot of questions about her seizures, EEGs and subsequent treatment with antiepileptic drugs after her stroke. Discussed this in detail with him please see addendum which does show that patient had episodes of alteration and confusion, epileptiform activity and seizures confirmed on EEG. She is not having episodes of confusion since then. No episodes of staring, non-purposeful movements or staring episodes or anything concerning for ongoing partial seizures. However she says that she can't remember coming down here in May. Her long term memory is good. She can remember things that she did a long time ago. She  can't remember things that happened yesterday, doesn't remember people that they talk to since she had the stroke. All these symptoms have started since the stroke. She isn't very social. She does have a long history of mood disorder. She does want to see her family and is interested in seeing her grandkids. She is not very interested in other things. She is not positive, she is frequently sad. She doesn't laugh very often. Discussed that considering her large stroke, she is at high risk for continued seizures and I recommend continued Rep she is not having side effects. We could repeat EEG and try weaning her off the Leslie if they feel strongly about trying to discontinue medication. However I suggest follow up with Dr. Erlinda Hong who is a stroke specialist. Two sleep studies were negative.    Initial visit 07/15/2014: [name] is a 70 y.o. female here as a referral from Dr. Hassell [name] for stroke. She has a past medical history of hypertension, diabetes, high cholesterol, atrial fibrillation, depression, anxiety, hypothyroidism, peripheral neuropathy. She sees [name] as her cardiologist and family speaks very highly of him, for paroxysmal A. Fib.. She is here with her husband and son who provide most of the information. Husband is very concerned as he feels patient's memory is affected, her mood is poor, he is unsure why she was treated for seizures in the past and whether she even has seizures. Stroke was in February 20 15th. Patient suffered a stroke during ablation for atrial fibrillation. She was taken to [facility] and was admitted for stroke. She was in rehab at [facility] after the stroke. Then she  went to [facility] for rehabilitation and was diagnosed with seizures. She was seeing a neurologist in [place] and had multiple EEGs and husband is unsure if she was being treated appropriately. She has residual right-sided weakness and aphasia. Per husband and son,  she has physically significantly improved since her stroke. She is in physical therapy, occupational therapy as well as speech therapy.   However the biggest concern is her mood. They feel she has depression. When patient is asked if she is depressed she nods yes. Some weeks are better than others but she has lost interest in doing activities that she liked, she appears very fatigued, disinterest in activities, sad most days. She gets frustrated with phones, she won't use a computer, she gets flustered. Her mood is variable, sometimes she is happier than other times. Motivation fluctuates. She doesn't want to go out of the house. Son's wife spends time with patient and is worried about patient's outlook.. Family Questions on whether or not this is associated with stroke. They're very concerned and would like to help patient. She has lost weight and has decreased appetite.   Review of Systems: Patient complains of symptoms per HPI as well as the following symptoms: Fatigue, and expected weight change, eye itching, light sensitivity, blurred vision. Pertinent negatives per HPI. All others negative..   Social History   Social History  . Marital Status: Married    Spouse Name: [name]"  . Number of Children: 2  . Years of Education: 12+   Occupational History  . ADMINISTRATION    Social History Main Topics  . Smoking status: Never Smoker   . Smokeless tobacco: Never Used  . Alcohol Use: No  . Drug Use: No  . Sexual Activity: Yes   Other Topics Concern  . Not on file   Social History Narrative   Lives in [place].     Caffeine use: drinks coffee/tea: 2 cups coffee in the morning, drinks 1 glass tea per day        Family History  Problem Relation Age of Onset  . Breast cancer Sister   . Ovarian cancer Sister   . Uterine cancer Paternal Aunt   . Crohn's disease Other     neice  . Diabetes Mother   . Heart disease Mother   . Diabetes Maternal Grandmother   . Diabetes  Sister   . Heart disease Father   . Heart disease Brother   . Heart disease Brother   . Colon cancer Neg Hx   . Stomach cancer Neg Hx     Past Medical History  Diagnosis Date  . Impaired glucose tolerance   . Psoriasis   . IBS (irritable bowel syndrome)     vs diarrhea vs abd. fullness   . Chronic anxiety   . Uterine prolapse   . Bronchial spasms   . Hypertension   . Esophageal stricture   . Elevated blood sugar   . Diverticulosis   . Rectocele, female   . History of bladder repair surgery   . Hx of vaginal hysterectomy   . Vaginal prolapse 1998  . Hypothyroidism   . Psoriasis   . Tachy-brady syndrome ([place])     a. 08/19/2008 s/p PPM: SJM 2110 Accent  . Complication of anesthesia     " I shake real bad "  . Family history of anesthesia complication     Daughter also shakes while waking up  . GERD (gastroesophageal reflux disease)   . H/O  hiatal hernia   . Neuromuscular disorder ([place])     periferal neuropathy  . Anemia   . Paroxysmal atrial fibrillation (H[facility])     a. failed flecainide, tikosyn, amio;  b. 01/2012 s/p RFCA.  . H/O scarlet fever   . Pacemaker     [employer]. [employer]    Past Surgical History  Procedure Laterality Date  . Vaginal hysterectomy      prolapse   . Bladder suspension    . Rectocele repair    . Transthoracic echocardiogram  2008  . Tee without cardioversion  02/06/2012    Procedure: TRANSESOPHAGEAL ECHOCARDIOGRAM (TEE);  Surgeon: Thayer Headings, MD;  Location: [facility] ENDOSCOPY;  Service: Cardiovascular;  Laterality: N/A;  . Atrial fibrillation ablation  02/06/12    PVI by Dr Rayann [name]  . Insert / replace / remove pacemaker      SJM  . Atrial fibrillation ablation  07/04/2012    repeat PVI by Dr Rayann [name]  . Tee without cardioversion N/A 07/04/2012    Procedure: TRANSESOPHAGEAL ECHOCARDIOGRAM (TEE);  Surgeon: Lelon Perla, MD;  Location: [facility] ENDOSCOPY;  Service: Cardiovascular;  Laterality: N/A;  . Atrial fibrillation ablation N/A 02/06/2012    Procedure:  ATRIAL FIBRILLATION ABLATION;  Surgeon: Thompson Grayer, MD;  Location: [facility] CATH LAB;  Service: Cardiovascular;  Laterality: N/A;  . Atrial fibrillation ablation N/A 07/04/2012    Procedure: ATRIAL FIBRILLATION ABLATION;  Surgeon: Thompson Grayer, MD;  Location: [facility] CATH LAB;  Service: Cardiovascular;  Laterality: N/A;  . Left heart catheterization with coronary angiogram N/A 03/24/2013    Procedure: LEFT HEART CATHETERIZATION WITH CORONARY ANGIOGRAM;  Surgeon: Blane Ohara, MD;  Location: [facility] CATH LAB;  Service: Cardiovascular;  Laterality: N/A;    Current Outpatient Prescriptions  Medication Sig Dispense Refill  . acetaminophen (TYLENOL) 500 MG tablet Take 500 mg by mouth every 6 (six) hours as needed (pain).     . Ascorbic Acid (VITAMIN C PO) Take 1 tablet by mouth daily.    . Calcium Carbonate-Vitamin D (CALCIUM-VITAMIN D) 500-200 MG-UNIT per tablet Take 1 tablet by mouth daily.    . cholecalciferol (VITAMIN D) 1000 UNITS tablet Take 1,000 Units by mouth daily.    . citalopram (CELEXA) 20 MG tablet TAKE 1 TABLET(20 MG) BY MOUTH TWICE DAILY 60 tablet 2  . dicyclomine (BENTYL) 10 MG capsule TAKE 1 CAPSULE BY MOUTH FOUR TIMES DAILY BEFORE MEALS AND AT BEDTIME 120 capsule 1  . fluticasone (FLONASE) 50 MCG/ACT nasal spray USE 2 SPRAYS IN EACH NOSTRIL EVERY DAY 16 g 1  . levETIRAcetam (KEPPRA) 500 MG tablet TAKE 1 TABLET BY MOUTH TWICE DAILY 60 tablet 11  . levothyroxine (SYNTHROID, LEVOTHROID) 88 MCG tablet TAKE 1 TABLET BY MOUTH EVERY DAY BEFORE BREAKFAST 30 tablet 11  . loperamide (IMODIUM) 2 MG capsule Take by mouth as needed for diarrhea or loose stools. Reported on 05/17/2015    . metFORMIN (GLUCOPHAGE XR) 500 MG 24 hr tablet Take 1 tablet (500 mg total) by mouth 2 (two) times daily after a meal. 180 tablet 3  . methotrexate (RHEUMATREX) 2.5 MG tablet Take 4 tablets (10 mg total) by mouth once a week. Caution:Chemotherapy. Protect from light. 16 tablet 1  . metoprolol (LOPRESSOR) 50 MG tablet Take 1  tablet (50 mg total) by mouth 2 (two) times daily. 180 tablet 3  . miconazole (MICOTIN) 2 % cream Apply 1 application topically 2 (two) times daily. 28.35 g 0  . mirtazapine (REMERON) 15 MG tablet Take 15 mg by  mouth at bedtime.    . Multiple Vitamins-Minerals (EYE VITAMINS PO) Take 2 tablets by mouth daily.     Marland Kitchen nystatin (MYCOSTATIN/NYSTOP) 100000 UNIT/GM POWD Apply BID underneath breast 60 g 0  . omeprazole (PRILOSEC) 40 MG capsule TAKE ONE CAPSULE BY MOUTH EVERY DAY 30 capsule 5  . ONE TOUCH ULTRA TEST test strip TEST BLOOD SUGAR ONCE A DAY AS NEEDED 100 each 5  . potassium chloride SA (K-DUR,KLOR-CON) 20 MEQ tablet TAKE 2 TABLETS BY MOUTH TWICE DAILY 120 tablet 5  . prednisoLONE acetate (PRED FORTE) 1 % ophthalmic suspension Place 1 drop into both eyes as directed.    . Probiotic Product (PROBIOTIC PO) Take 1 capsule by mouth daily.    . rivaroxaban (XARELTO) 20 MG TABS tablet Take 1 tablet (20 mg total) by mouth daily. 30 tablet 5  . simvastatin (ZOCOR) 40 MG tablet Take 1 tablet (40 mg total) by mouth daily. 30 tablet 5  . traMADol (ULTRAM) 50 MG tablet Take 1/2 tab QID PRN for severe pain. 28 tablet 0   No current facility-administered medications for this visit.    Allergies as of 07/20/2015 - Review Complete 07/20/2015  Allergen Reaction Noted  . Latex Rash 06/27/2012  . Caudal tray [lidocaine-epinephrine]  06/22/2010  . Ciprofloxacin hcl Nausea Only 06/03/2012  . Codeine Nausea Only   . Diovan [valsartan] Itching 06/22/2010  . Doxycycline Other (See Comments)   . Esomeprazole magnesium  06/22/2010  . Levofloxacin  06/22/2010  . Myrbetriq [mirabegron] Diarrhea 12/01/2013  . Neomycin-bacitracin zn-polymyx    . Penicillins    . Sulfa drugs cross reactors Other (See Comments) 06/22/2010  . Verapamil  06/22/2010  . Vimovo [naproxen-esomeprazole]  06/22/2010    Vitals: BP 132/85 mmHg  Pulse 72  Ht 5\' 4"  (1.626 m)  Wt 176 lb 6.4 oz (80.015 kg)  BMI 30.26 kg/m2 Last  Weight:  Wt Readings from Last 1 Encounters:  07/20/15 176 lb 6.4 oz (80.015 kg)   Last Height:   Ht Readings from Last 1 Encounters:  07/20/15 5\' 4"  (1.626 m)     Speech:  Expressive aphasia, comprehension appears to be intact Cognition:  The patient is oriented to person, place, and time;   recent and remote memory do not appear to be affected   normal attention, concentration,   fund of knowledge appears grossly intact Cranial Nerves:  The pupils are equal, round, and reactive to light. Visual fields are full to finger confrontation. Extraocular movements are intact. Trigeminal sensation is intact and the muscles of mastication are normal. Right lower facial droop. The palate elevates in the midline. Hearing intact. Voice is normal. Shoulder shrug is normal. The tongue has normal motion without fasciculations.   Coordination:  No dysmetria on the left, cannot perform on the right due to weakness  Gait:  With walker  Motor Observation:  No asymmetry, no atrophy, and no involuntary movements noted. Tone:  Normal muscle tone.   Posture:  Posture is normal. normal erect   Strength: right arm and hip flexion 4/5 and right DF 4+/5  Otherwise strength is V/V in the left upper and left lower limbs.    Sensation: intact to LT, there does not appear to be hemisensory loss, patient reports equal sensation bilaterally to touch. However difficult exam as patient is mostly nonverbal.. However I would expect hemisensory loss based on size and location of CVA.   Reflex Exam:   Toes:  Right upgoing  Clonus:  Clonus is absent.  Assessment/Plan: 70 year old patient who presents with her husband here for follow up of stroke with residual right-sided hemiparesis and aphasia. By account she appears to be doing very well after her stroke as far as physical therapy, occupational therapy and speech therapy are concern. However patient appears  to have significant depression which is concerning for husband and son, discussed depression at length with family, this is very common after stroke. Patient is on Celexa and follows closely with Dr. Redge [name]. They have been evaluated by primary care to ensure there are no medical causes for patient's fatigue and depression including thyroid testing, ensuring there is no anemia, or any infection or any other metabolic, toxic etiology. Highly recommended following up with therapy and psychiatry. The family appears very earnest in their concern and caring for patient, the effects of stroke on a family can be devastating. Discussed this at length with family.   I would like patient to continue Keppra. EEG was abnormal, keppra will likely be life long. Continue Xarelto and statin for stroke prevention. Follow closely with primary care for management of vascular risk factors. Continue physical therapy, occupational therapy, speech therapy. Struggles with depression, needs therapy and psychiatry.  Discussed driving at length, she should have a formal motor vehicle evaluation before driving on the road.   Addendum 07/20/2014: Received extensive records.  [facility] rehabilitation consult notes: Patient was initially admitted to [facility] for electrophysiology study and radiofrequency ablation in February 2015. Unfortunately postoperatively she was noted to be weak on her right side. She was diagnosed with a left embolic MCA distribution CVA. She was also noted to have significant dysphagia and dysarthria. Resultant right-sided hemiparesis and hemisensory loss with dense expressive aphasia, dysarthria and dysphasia. She was admitted to the inpatient rehabilitation unit in 04/26/2013. Notes state that at that time of admission she had complete plegia 0/5 in the right upper extremity and right lower extremity. She was able to follow commands but was mostly nonverbal. Also reported  right-sided hemisensory loss.  Per notes, patient appeared lethargic and CT of the head and EEG was performed in rehabilitation. CT on approx 04/29/2013 was compared with CT on 04/16/2013 from [facility], showed an acute left MCA infarction with increased density in the proximal left MCA likely reflecting a thrombus but could be artifactual. No acute intracranial hemorrhage. This finding again likely reflects the left MCA thrombus and now subacute left MCA infarction. EEG on 04/30/2013 showed mild encephalopathy, left cerebral hemisphere structural and/or physiologic brain damage or dysfunction secondary to recent large middle cerebral artery territory infarction, decreased threshold for seizure of bilateral cerebral hemisphere origin with focalization over the middle aspect of the temporal lobes. Right more than left. No clear evidence of active seizure. Overnight oximetry report was performed on 04/30/2013. The patient was on room air and observed to have slept during testing, recorded over 6 hours 32 minutes, the lowest SaO2 recorded was 93%, negative study for oxygen desaturation.  GI Consult Notes from 05/08/2013 discussed watery diarrhea, C. difficile testing was negative. There is no blood or mucus in the stool. At that time she was on tube feeds, diarrhea started after Glucerna. Diarrhea most likely due to tube feed induced diarrhea. Patient was again seen on April 1 as requested for the evaluation of persistent diarrhea. 37 notes state patient had at least 3-4 loose bowel movements daily, C. difficile has been negative, no leukocytosis. Negative workup for infectious etiology. Physician discussed the possible etiologies with patient and  her husband. A sigmoidoscopy was recommended to look for pseudomembranous colitis, ulcerative colitis, microscopic or collagenous colitis. Patient was put on Levaquin, metronidazole. Sigmoidoscopy showed mild diverticulosis on the left side, patchy erythema  with exudate consistent with colitis in the rectosigmoid area biopsied for treatment, sensitive and spastic colon.   Patient was discharged from rehabilitation on 05/23/2013. Per discharge summary: Patient made fair progression throughout her rehabilitation hospitalization. At that time patient had passed a swallow study and was put on oral diet. Continued speech therapy and SLP was recommended. Discharge summary states that neurology started patient on Ritalin for decreased attention. Discharge summary states that she was diagnosed with colitis and started on antibiotics. She did undergo a sigmoidoscopy the day prior to her discharge. Paxil was increased several days prior to her discharge due to depression. Patient was transferred from inpatient rehabilitation to skilled nursing.    EEG performed 08/01/2013 patient was sent from her subacute rehabilitation facility. Impression was abnormal EEG with finding indicating left cerebral hemisphere structural and/or physiologic brain damage or dysfunction. Epileptiform activity with epileptogenic potential over the left cerebral hemisphere (phase reversal at T3) with possibility of localization over the left mid temporal lobes.  Notes from neurology and sleep clinic of [place]: Notes state that this physician first saw patient in rehabilitation on 04/29/2013. Patient was lethargic and she had an abnormal EEG on 04/30/2013. At that time physician started on Keppra and performed an overnight pulse ox (see above for results). No clinical seizures were recorded. She was started on Keppra 250 mg 3 times a day. Repeat EEG was performed on 08/01/2013 (see above for results) EEG on 10/21/2013 was abnormal awake and asleep EEG showing brief electrographic subclinical seizures and interictal discharges arising from the left frontal temporal lobes. EEG 03/10/2014 showed minimal focal epileptiform activity arising from the anterior aspect of the left frontal  temporal lobe with no electrographic subclinical seizures. Physician attributed her lethargy and cognitive mentation changes as attributed to seizure activity. Keppra was increased to 500 in the morning and 750 in the evenings in October 2015.  Still pending notes from [facility]: reviewed 561 pages of records. Significant details as relating to her neurologic management was noted below:  Encounter date 02/28/2013: She had 2 catheter ablation procedures(2013 and 2014) for afib on xarelto and, s/p pacemaker, antiarrhythmic medications. Still with continued afib, fatigue and palpitations. Hx of impaired glucose control, HTN, DM,chronic back pain, bronchospasm, hypothyroidism, IBS, peripheral neuropathy, anxiety, and psoriasis. Discussed combined endocardial and epicardial ablation procedure.   03/24/2013: left heart cath, selective coronary angiography, LV angiography results show normal coronary arteries with nml left ventricular systolic function.  03/03/2013: PMHx paroxysmal afib, sinus node dysfunction and tachybradycardia syndrome s/p pacemaker in 2010. They discussed again combined endocardial and epicardial ablation procedure or as a last resort AV node ablation.  Echo 03/2013: no intracardiac thrombus, nml left ventricular EF, nml right ventricular contractile performance.   Carotid duplex 04/14/2013: Right carotid: Evidence in the ICA of a less than 40% stenocis Left carotid: evidence in the ICA of a less than 40% stenosis. Non-hemodynamically significant plaque noted in the cca. Vertebrals: patent with ategrade flow Subclavians: normal flow bilaterally  2/24/105: procedure for transabdominal laporoscopic epicardial and endocardial radiofrequency ablation of the left atrium. Notes state successful hybrid ablation procedure for paroxysmal afib. Notes state that pt begain to wake up from anesthesia and not moving right arm. Right arm no response to pain. Patient  non-verbal but responding by nodding, all other extremities moving appropriately. She did start to grimace to pain on the right arm. Also noted to have right facial droop. Neurology was consulted.  Neurology note: noted right facial droop, patient following some simple commands, speech with aphasia, no movement in right arm and leg. NIHSS 25. She was not a tPA candidate. CT head stat, could not have MRI due to pacemaker. Large L MCA stroke, increased density in the proximal left MCA likely thrombus with resultant right-sided weakness and expressive aphasia with a left gaze preference.  ldl 21, hgba1c 6.2,   Neurology note 2/28: neurologically stable for 3 days and transferred back to the cicu from the [facility]. Neurologic exam was limited due to hypersomnolence. Slight gaze preference, can cross the midline, right facial droop, decreased tone in the right arm with pronator drift, not moving r arm or leg even to noxious stim. Left side moving spontaneously, upgoing r toe.    EGD with peg tube 04/23/2013.Marland Kitchen  She was discharged to inpatient rehab(see details above) but did not regain any movement to her right side prior to discharge.   Sarina Ill, MD  [facility] [address] [place], [place] [address]  Phone [phone] Fax [phone]  A total of 25 minutes was spent face-to-face with this patient. Over half this time was spent on counseling patient on the stroke, seizure, depression diagnosis and different diagnostic and therapeutic options available.

## 2015-07-27 DIAGNOSIS — H2512 Age-related nuclear cataract, left eye: Secondary | ICD-10-CM | POA: Diagnosis not present

## 2015-07-27 DIAGNOSIS — H269 Unspecified cataract: Secondary | ICD-10-CM | POA: Diagnosis not present

## 2015-07-29 ENCOUNTER — Other Ambulatory Visit: Payer: Self-pay | Admitting: Family Medicine

## 2015-08-10 DIAGNOSIS — H2511 Age-related nuclear cataract, right eye: Secondary | ICD-10-CM | POA: Diagnosis not present

## 2015-08-10 DIAGNOSIS — H269 Unspecified cataract: Secondary | ICD-10-CM | POA: Diagnosis not present

## 2015-08-16 ENCOUNTER — Other Ambulatory Visit: Payer: Self-pay | Admitting: Family Medicine

## 2015-08-20 ENCOUNTER — Telehealth: Payer: Self-pay | Admitting: Family Medicine

## 2015-08-20 MED ORDER — MIRTAZAPINE 15 MG PO TABS: 15.0000 mg | ORAL_TABLET | Freq: Every day | ORAL | Status: DC

## 2015-08-20 NOTE — Telephone Encounter (Signed)
Pt aware refill sent 

## 2015-09-08 ENCOUNTER — Other Ambulatory Visit: Payer: Self-pay | Admitting: Family Medicine

## 2015-09-28 ENCOUNTER — Other Ambulatory Visit: Payer: Self-pay | Admitting: *Deleted

## 2015-09-28 MED ORDER — CITALOPRAM HYDROBROMIDE 20 MG PO TABS: ORAL_TABLET | ORAL | 2 refills | Status: DC

## 2015-10-04 ENCOUNTER — Telehealth: Payer: Self-pay | Admitting: Cardiology

## 2015-10-04 ENCOUNTER — Ambulatory Visit (INDEPENDENT_AMBULATORY_CARE_PROVIDER_SITE_OTHER): Payer: Medicare Other | Admitting: *Deleted

## 2015-10-04 DIAGNOSIS — I495 Sick sinus syndrome: Secondary | ICD-10-CM

## 2015-10-04 NOTE — Telephone Encounter (Signed)
Confirmed remote transmission w/ pt husband.   

## 2015-10-05 ENCOUNTER — Telehealth: Payer: Self-pay | Admitting: Internal Medicine

## 2015-10-05 NOTE — Telephone Encounter (Signed)
New message       Please call to help pt with remote transmission

## 2015-10-05 NOTE — Telephone Encounter (Signed)
Spoke w/ pt husband and attempted to help him send pt remote transmission. Transmission unsuccessful I instructed pt husband to call tech services. Pt husband verbalized understanding.

## 2015-10-07 ENCOUNTER — Encounter: Payer: Self-pay | Admitting: Cardiology

## 2015-10-07 NOTE — Progress Notes (Signed)
Remote pacemaker transmission.   

## 2015-10-08 LAB — CUP PACEART REMOTE DEVICE CHECK
Battery Remaining Longevity: 99 mo
Battery Remaining Percentage: 81 %
Battery Voltage: 2.93 V
Brady Statistic AP VP Percent: 1 %
Brady Statistic AP VS Percent: 27 %
Brady Statistic AS VP Percent: 2.5 %
Brady Statistic AS VS Percent: 71 %
Brady Statistic RA Percent Paced: 27 %
Brady Statistic RV Percent Paced: 2.6 %
Date Time Interrogation Session: 20170815173632
Implantable Lead Implant Date: 20100630
Implantable Lead Implant Date: 20100630
Implantable Lead Location: 753859
Implantable Lead Location: 753860
Lead Channel Impedance Value: 440 Ohm
Lead Channel Impedance Value: 440 Ohm
Lead Channel Pacing Threshold Amplitude: 1 V
Lead Channel Pacing Threshold Amplitude: 1 V
Lead Channel Pacing Threshold Pulse Width: 0.4 ms
Lead Channel Pacing Threshold Pulse Width: 0.8 ms
Lead Channel Sensing Intrinsic Amplitude: 4.9 mV
Lead Channel Sensing Intrinsic Amplitude: 5.6 mV
Lead Channel Setting Pacing Amplitude: 2 V
Lead Channel Setting Pacing Amplitude: 2.5 V
Lead Channel Setting Pacing Pulse Width: 0.8 ms
Lead Channel Setting Sensing Sensitivity: 1.5 mV
Pulse Gen Model: 2110
Pulse Gen Serial Number: 2285953

## 2015-10-13 ENCOUNTER — Encounter: Payer: Medicare Other | Admitting: *Deleted

## 2015-10-18 DIAGNOSIS — Z1231 Encounter for screening mammogram for malignant neoplasm of breast: Secondary | ICD-10-CM | POA: Diagnosis not present

## 2015-10-21 ENCOUNTER — Encounter: Payer: Self-pay | Admitting: Cardiology

## 2015-10-22 ENCOUNTER — Other Ambulatory Visit: Payer: Self-pay | Admitting: Family Medicine

## 2015-10-24 ENCOUNTER — Other Ambulatory Visit: Payer: Self-pay | Admitting: Family Medicine

## 2015-10-28 ENCOUNTER — Telehealth: Payer: Self-pay | Admitting: Family Medicine

## 2015-10-28 NOTE — Telephone Encounter (Signed)
[facility] regular pt - hx of stoke, been moving and doing therapy more and more.   Could we call in something?

## 2015-10-28 NOTE — Telephone Encounter (Signed)
What would like called in?

## 2015-10-29 MED ORDER — CYCLOBENZAPRINE HCL 5 MG PO TABS: 5.0000 mg | ORAL_TABLET | Freq: Three times a day (TID) | ORAL | 0 refills | Status: DC | PRN

## 2015-10-29 NOTE — Telephone Encounter (Signed)
Muscle relaxer  - something mild

## 2015-10-29 NOTE — Telephone Encounter (Signed)
Flexeril sent to parmacy - sedation precautions

## 2015-10-29 NOTE — Telephone Encounter (Signed)
Husband aware.

## 2015-10-31 ENCOUNTER — Other Ambulatory Visit: Payer: Self-pay | Admitting: Family Medicine

## 2015-11-05 ENCOUNTER — Other Ambulatory Visit: Payer: Self-pay | Admitting: Family Medicine

## 2015-11-22 DIAGNOSIS — H61002 Unspecified perichondritis of left external ear: Secondary | ICD-10-CM | POA: Diagnosis not present

## 2015-11-22 DIAGNOSIS — Z79899 Other long term (current) drug therapy: Secondary | ICD-10-CM | POA: Diagnosis not present

## 2015-11-22 DIAGNOSIS — L409 Psoriasis, unspecified: Secondary | ICD-10-CM | POA: Diagnosis not present

## 2015-11-22 DIAGNOSIS — Z5181 Encounter for therapeutic drug level monitoring: Secondary | ICD-10-CM | POA: Diagnosis not present

## 2015-11-25 ENCOUNTER — Encounter: Payer: Self-pay | Admitting: Family Medicine

## 2015-11-25 ENCOUNTER — Ambulatory Visit (INDEPENDENT_AMBULATORY_CARE_PROVIDER_SITE_OTHER): Payer: Medicare Other | Admitting: Family Medicine

## 2015-11-25 ENCOUNTER — Other Ambulatory Visit: Payer: Self-pay | Admitting: Family Medicine

## 2015-11-25 VITALS — BP 140/85 | HR 76 | Temp 97.0°F | Ht 64.0 in | Wt 178.0 lb

## 2015-11-25 DIAGNOSIS — I480 Paroxysmal atrial fibrillation: Secondary | ICD-10-CM

## 2015-11-25 DIAGNOSIS — E034 Atrophy of thyroid (acquired): Secondary | ICD-10-CM | POA: Diagnosis not present

## 2015-11-25 DIAGNOSIS — I6930 Unspecified sequelae of cerebral infarction: Secondary | ICD-10-CM | POA: Diagnosis not present

## 2015-11-25 DIAGNOSIS — E119 Type 2 diabetes mellitus without complications: Secondary | ICD-10-CM | POA: Diagnosis not present

## 2015-11-25 DIAGNOSIS — E785 Hyperlipidemia, unspecified: Secondary | ICD-10-CM | POA: Diagnosis not present

## 2015-11-25 DIAGNOSIS — I6349 Cerebral infarction due to embolism of other cerebral artery: Secondary | ICD-10-CM | POA: Diagnosis not present

## 2015-11-25 DIAGNOSIS — G8311 Monoplegia of lower limb affecting right dominant side: Secondary | ICD-10-CM | POA: Diagnosis not present

## 2015-11-25 DIAGNOSIS — Z780 Asymptomatic menopausal state: Secondary | ICD-10-CM

## 2015-11-25 DIAGNOSIS — I10 Essential (primary) hypertension: Secondary | ICD-10-CM

## 2015-11-25 DIAGNOSIS — Z13820 Encounter for screening for osteoporosis: Secondary | ICD-10-CM | POA: Diagnosis not present

## 2015-11-25 DIAGNOSIS — E559 Vitamin D deficiency, unspecified: Secondary | ICD-10-CM | POA: Diagnosis not present

## 2015-11-25 DIAGNOSIS — Z23 Encounter for immunization: Secondary | ICD-10-CM | POA: Diagnosis not present

## 2015-11-25 LAB — BAYER DCA HB A1C WAIVED: HB A1C (BAYER DCA - WAIVED): 8.4 % — ABNORMAL HIGH (ref <7.0)

## 2015-11-25 MED ORDER — FOLIC ACID 1 MG PO TABS: 1.0000 mg | ORAL_TABLET | Freq: Every day | ORAL | 11 refills | Status: DC

## 2015-11-25 NOTE — Patient Instructions (Addendum)
Medicare Annual Wellness Visit  [facility] and the medical providers at [facility] strive to bring you the best medical care.  In doing so we not only want to address your current medical conditions and concerns but also to detect new conditions early and prevent illness, disease and health-related problems.    Medicare offers a yearly Wellness Visit which allows our clinical staff to assess your need for preventative services including immunizations, lifestyle education, counseling to decrease risk of preventable diseases and screening for fall risk and other medical concerns.    This visit is provided free of charge (no copay) for all Medicare recipients. The clinical pharmacists at [facility] have begun to conduct these Wellness Visits which will also include a thorough review of all your medications.    As you primary medical provider recommend that you make an appointment for your Annual Wellness Visit if you have not done so already this year.  You may set up this appointment before you leave today or you may call back [phone] and schedule an appointment.  Please make sure when you call that you mention that you are scheduling your Annual Wellness Visit with the clinical pharmacist so that the appointment may be made for the proper length of time.     Continue current medications. Continue good therapeutic lifestyle changes which include good diet and exercise. Fall precautions discussed with patient. If an FOBT was given today- please return it to our front desk. If you are over 5 years old - you may need Prevnar 67 or the adult Pneumonia vaccine.  **Flu shots are available--- please call and schedule a FLU-CLINIC appointment**  After your visit with [place] today you will receive a survey in the mail or online from [employer] regarding your care with [place]. Please take a moment to fill this out. Your feedback is very  important to [place] as you can help [place] better understand your patient needs as well as improve your experience and satisfaction. WE CARE ABOUT YOU!!!   Schedule appt with Dr Johney Maine for hernia repair We will call you about labs and urine Please check with DMV about driving school for [place] Please encourage and remind her that more exercise is good for her back, good for her self-confidence, and good for her driving Please get a second opinion as mentioned regarding her vision because she says she can't see well. If the second opinion agrees that the glasses are not appropriate go back to the first doctor that prescribed the glasses and get them fixed so that she can see better. This most importantly should be done before she starts any driving whatsoever The flu shot and she received today may make her arm sore We will also arrange for her to have a pelvic exam as this has not been done in years.

## 2015-11-25 NOTE — Progress Notes (Signed)
Subjective:    Patient ID: [name], female    DOB: [birth year], 70 y.o.   MRN: [id number]  HPI Pt here for follow up and management of chronic medical problems which includes diabetes, hyperlipidemia and hypothyroid. She is taking medications regularly.Patient comes to the visit today with her husband Mortimer Fries. She is having some pain from her hernia and is not interested in doing any surgery at this time. She is having some back pain and is requesting refills on her folic acid. The patient comes to the visit today with her husband who obviously has been taking incredible care of her. The patient improves every time she is seen. She is in a positive mood today smiling and laughing. She still has a little trouble expressing herself. She has good strength in both of her lower extremities. She is not very interested in getting more physical exercise and we encouraged her during the conversation to get more exercise. I think she is more willing to do this. She also did pass her driving test despite the fact that her vision is not good. After discussion the plans are she will go back and get her glasses adjusted and will consider going to a driving school to make sure that she is okay to drive. The patient denies any chest pain or shortness of breath. She denies any problems with her GI tract including nausea vomiting diarrhea blood in the stool or black tarry bowel movements. She does have a large incisional hernia that she was planning on getting repaired but refuses to do this and she is encouraged during the visit to go back and make an appointment with the surgeon and get this repaired. She denies any trouble with passing her water burning pain or frequency.    Patient Active Problem List   Diagnosis Date Noted  . Type 2 diabetes mellitus ([place]) 05/17/2015  . Cerebrovascular accident (CVA) due to embolism of left middle cerebral artery ([place]) 11/24/2014  . Focal and partial seizures ([place]) 11/24/2014    . Depression 11/24/2014  . Cerebral infarction due to embolism of other cerebral artery ([place]) 09/03/2013  . Metabolic syndrome 40/09/6759  . High risk medication use 03/13/2013  . Fever, unspecified 06/05/2012  . Generalized abdominal pain 06/05/2012  . PAF (paroxysmal atrial fibrillation) ([place]) 02/07/2012  . Tachy-brady syndrome ([place])   . Impaired glucose tolerance   . Fatigue 12/22/2009  . SNORING 12/22/2009  . OTHER CHRONIC NONALCOHOLIC LIVER DISEASE 95/10/3265  . NONSPEC ELEVATION OF LEVELS OF TRANSAMINASE/LDH 03/09/2009  . PPM-St.[place] 09/01/2008  . BRADYCARDIA 08/13/2008  . Hypothyroidism 06/09/2008  . HTN (hypertension) 06/09/2008  . Atrial fibrillation ([place]) 06/09/2008  . PREMATURE VENTRICULAR CONTRACTIONS 06/09/2008  . IRRITABLE BOWEL SYNDROME 06/09/2008  . PSORIASIS 06/09/2008  . EDEMA 06/09/2008  . DIVERTICULOSIS, COLON, HX OF 06/09/2008   Outpatient Encounter Prescriptions as of 11/25/2015  Medication Sig  . acetaminophen (TYLENOL) 500 MG tablet Take 500 mg by mouth every 6 (six) hours as needed (pain).   . Ascorbic Acid (VITAMIN C PO) Take 1 tablet by mouth daily.  . Calcium Carbonate-Vitamin D (CALCIUM-VITAMIN D) 500-200 MG-UNIT per tablet Take 1 tablet by mouth daily.  . cholecalciferol (VITAMIN D) 1000 UNITS tablet Take 1,000 Units by mouth daily.  . citalopram (CELEXA) 20 MG tablet TAKE 1 TABLET(20 MG) BY MOUTH TWICE DAILY  . clobetasol cream (TEMOVATE) 0.05 % Apply to affected areas on body 1-2 times daily as needed. Never to face.  . cyclobenzaprine (FLEXERIL) 5 MG  tablet Take 1 tablet (5 mg total) by mouth 3 (three) times daily as needed for muscle spasms.  Marland Kitchen dicyclomine (BENTYL) 10 MG capsule TAKE 1 CAPSULE BY MOUTH FOUR TIMES DAILY BEFORE MEALS AND AT BEDTIME  . fluticasone (FLONASE) 50 MCG/ACT nasal spray SHAKE LIQUID AND USE 2 SPRAYS IN EACH NOSTRIL DAILY  . folic acid (FOLVITE) 1 MG tablet Take 1 mg by mouth.  . levETIRAcetam (KEPPRA) 500 MG tablet TAKE 1  TABLET BY MOUTH TWICE DAILY  . levothyroxine (SYNTHROID, LEVOTHROID) 88 MCG tablet TAKE 1 TABLET BY MOUTH EVERY DAY BEFORE BREAKFAST  . loperamide (IMODIUM) 2 MG capsule Take by mouth as needed for diarrhea or loose stools. Reported on 05/17/2015  . metFORMIN (GLUCOPHAGE XR) 500 MG 24 hr tablet Take 1 tablet (500 mg total) by mouth 2 (two) times daily after a meal.  . methotrexate (RHEUMATREX) 2.5 MG tablet Take 4 tablets (10 mg total) by mouth once a week. Caution:Chemotherapy. Protect from light.  . metoprolol (LOPRESSOR) 50 MG tablet Take 1 tablet (50 mg total) by mouth 2 (two) times daily.  . miconazole (MICOTIN) 2 % cream Apply 1 application topically 2 (two) times daily.  . mirtazapine (REMERON) 15 MG tablet Take 1 tablet (15 mg total) by mouth at bedtime.  . Multiple Vitamins-Minerals (EYE VITAMINS PO) Take 2 tablets by mouth daily.   . NYSTATIN powder APPLY TWICE DAILY UNDERNEATH BREASTS  . omeprazole (PRILOSEC) 40 MG capsule TAKE 1 CAPSULE BY MOUTH EVERY DAY  . ONE TOUCH ULTRA TEST test strip TEST BLOOD SUGAR ONCE A DAY AS NEEDED  . potassium chloride SA (K-DUR,KLOR-CON) 20 MEQ tablet TAKE 2 TABLETS BY MOUTH TWICE DAILY  . prednisoLONE acetate (PRED FORTE) 1 % ophthalmic suspension Place 1 drop into both eyes as directed.  . Probiotic Product (PROBIOTIC PO) Take 1 capsule by mouth daily.  . rivaroxaban (XARELTO) 20 MG TABS tablet Take 1 tablet (20 mg total) by mouth daily.  . simvastatin (ZOCOR) 40 MG tablet Take 1 tablet (40 mg total) by mouth daily.  . traMADol (ULTRAM) 50 MG tablet Take 1/2 tab QID PRN for severe pain.   No facility-administered encounter medications on file as of 11/25/2015.       Review of Systems  Constitutional: Negative.   HENT: Negative.   Eyes: Negative.   Respiratory: Negative.   Cardiovascular: Negative.   Gastrointestinal: Positive for abdominal pain (hernia pain at times  - wait on surgery).  Endocrine: Negative.   Genitourinary: Negative.     Musculoskeletal: Positive for back pain.  Skin: Negative.   Allergic/Immunologic: Negative.   Neurological: Negative.   Hematological: Negative.   Psychiatric/Behavioral: Negative.        Objective:   Physical Exam  Constitutional: She is oriented to person, place, and time. She appears well-developed and well-nourished. No distress.  HENT:  Head: Normocephalic and atraumatic.  Right Ear: External ear normal.  Left Ear: External ear normal.  Nose: Nose normal.  Mouth/Throat: Oropharynx is clear and moist. No oropharyngeal exudate.  Eyes: Conjunctivae and EOM are normal. Pupils are equal, round, and reactive to light. Right eye exhibits no discharge. Left eye exhibits no discharge. No scleral icterus.  Neck: Normal range of motion. Neck supple. No thyromegaly present.  Cardiovascular: Normal rate, regular rhythm, normal heart sounds and intact distal pulses.   No murmur heard. Heart was regular at 76/m  Pulmonary/Chest: Effort normal and breath sounds normal. No respiratory distress. She has no wheezes. She has no rales.  Clear anteriorly and posteriorly  Abdominal: Soft. Bowel sounds are normal. She exhibits no mass. There is no tenderness. There is no rebound and no guarding.  The patient has an incisional hernia that is a probably 3 inches or more in diameter. It is reducible.  Musculoskeletal: She exhibits no edema or tenderness.  The patient has her right arm in a sling as this was the side that was affected by the stroke. She has some grip with the fingers but is not able to move the arm fully. The lower extremities have good strength and the left hand and arm have full range of motion.  Lymphadenopathy:    She has no cervical adenopathy.  Neurological: She is alert and oriented to person, place, and time. She has normal reflexes. No cranial nerve deficit.  Patient's ability to express herself and still somewhat limited but she knows what she is trying to say and it is  sometimes difficult to understand her.  Skin: Skin is warm and dry. No rash noted.  Psychiatric: She has a normal mood and affect. Her behavior is normal. Judgment and thought content normal.  Nursing note and vitals reviewed.  BP 140/85 (BP Location: Left Arm)   Pulse 76   Temp 97 F (36.1 C) (Oral)   Ht '5\' 4"'$  (1.626 m)   Wt 178 lb (80.7 kg)   BMI 30.55 kg/m         Assessment & Plan:  1. Type 2 diabetes mellitus without complication, without long-term current use of insulin (HCC) -The patient will be encouraged to exercise more regularly and to continue to watch her diet and keep her weight down as much as possible - Microalbumin / creatinine urine ratio - CBC with Differential/Platelet - Bayer DCA Hb A1c Waived  2. Hyperlipidemia, unspecified hyperlipidemia type -Continue current treatment pending results of lab work - CBC with Differential/Platelet - Lipid panel  3. Essential hypertension -Blood pressure is good today and he'll be no change in treatment - CBC with Differential/Platelet - BMP8+EGFR - Hepatic function panel  4. Vitamin D deficiency -Continue current treatment pending results of lab work - DG [place]; Future - CBC with Differential/Platelet - VITAMIN D 25 Hydroxy (Vit-D Deficiency, Fractures)  5. PAF (paroxysmal atrial fibrillation) (HCC) -The heart had a regular rate and rhythm today at about 72-76/m. - CBC with Differential/Platelet  6. Hypothyroidism due to acquired atrophy of thyroid -Continue current treatment pending results of lab work - CBC with Differential/Platelet - Thyroid Panel With TSH  7. Cerebral infarction due to embolism of other cerebral artery (HCC) -The patient continues to recover from this stroke from the past with residual of weakness in the right upper extremity and difficulty in speaking as clearly as she would like.  - CBC with Differential/Platelet - DME Other see comment  8. Screening for osteoporosis - DG [facility]  DEXA; Future  9. Postmenopausal -Schedule pelvic with mid-level - DG [facility] DEXA; Future  10. Paralysis of right lower extremity ([place]) -She has much better strength with her right lower extremity is able to walk without assistance. She still has some residual weakness in the right upper extremity.  11. History of CVA with residual deficit -The residual deficit is mostly located in the right upper extremity and her ability to express herself is clearly as she would like  12. Encounter for immunization - Flu vaccine HIGH DOSE PF  Meds ordered this encounter  Medications  . clobetasol cream (TEMOVATE) 0.05 %  Sig: Apply to affected areas on body 1-2 times daily as needed. Never to face.  Marland Kitchen DISCONTD: folic acid (FOLVITE) 1 MG tablet    Sig: Take 1 mg by mouth.  . folic acid (FOLVITE) 1 MG tablet    Sig: Take 1 tablet (1 mg total) by mouth daily.    Dispense:  30 tablet    Refill:  11   Patient Instructions                       Medicare Annual Wellness Visit  [facility] and the medical providers at [facility] strive to bring you the best medical care.  In doing so we not only want to address your current medical conditions and concerns but also to detect new conditions early and prevent illness, disease and health-related problems.    Medicare offers a yearly Wellness Visit which allows our clinical staff to assess your need for preventative services including immunizations, lifestyle education, counseling to decrease risk of preventable diseases and screening for fall risk and other medical concerns.    This visit is provided free of charge (no copay) for all Medicare recipients. The clinical pharmacists at [facility] have begun to conduct these Wellness Visits which will also include a thorough review of all your medications.    As you primary medical provider recommend that you make an appointment for your Annual Wellness Visit if you  have not done so already this year.  You may set up this appointment before you leave today or you may call back ([phone]) and schedule an appointment.  Please make sure when you call that you mention that you are scheduling your Annual Wellness Visit with the clinical pharmacist so that the appointment may be made for the proper length of time.     Continue current medications. Continue good therapeutic lifestyle changes which include good diet and exercise. Fall precautions discussed with patient. If an FOBT was given today- please return it to our front desk. If you are over 41 years old - you may need Prevnar 39 or the adult Pneumonia vaccine.  **Flu shots are available--- please call and schedule a FLU-CLINIC appointment**  After your visit with [place] today you will receive a survey in the mail or online from [employer] regarding your care with [place]. Please take a moment to fill this out. Your feedback is very important to [place] as you can help [place] better understand your patient needs as well as improve your experience and satisfaction. WE CARE ABOUT YOU!!!   Schedule appt with Dr Johney Maine for hernia repair We will call you about labs and urine Please check with DMV about driving school for [place] Please encourage and remind her that more exercise is good for her back, good for her self-confidence, and good for her driving Please get a second opinion as mentioned regarding her vision because she says she can't see well. If the second opinion agrees that the glasses are not appropriate go back to the first doctor that prescribed the glasses and get them fixed so that she can see better. This most importantly should be done before she starts any driving whatsoever The flu shot and she received today may make her arm sore We will also arrange for her to have a pelvic exam as this has not been done in years.   Arrie Senate MD

## 2015-11-26 LAB — CBC WITH DIFFERENTIAL/PLATELET
Basophils Absolute: 0 10*3/uL (ref 0.0–0.2)
Basos: 0 %
EOS (ABSOLUTE): 0.5 10*3/uL — ABNORMAL HIGH (ref 0.0–0.4)
Eos: 6 %
Hematocrit: 40.6 % (ref 34.0–46.6)
Hemoglobin: 13.8 g/dL (ref 11.1–15.9)
Immature Grans (Abs): 0 10*3/uL (ref 0.0–0.1)
Immature Granulocytes: 0 %
Lymphocytes Absolute: 3.4 10*3/uL — ABNORMAL HIGH (ref 0.7–3.1)
Lymphs: 37 %
MCH: 29.8 pg (ref 26.6–33.0)
MCHC: 34 g/dL (ref 31.5–35.7)
MCV: 88 fL (ref 79–97)
Monocytes Absolute: 0.8 10*3/uL (ref 0.1–0.9)
Monocytes: 8 %
Neutrophils Absolute: 4.4 10*3/uL (ref 1.4–7.0)
Neutrophils: 49 %
Platelets: 249 10*3/uL (ref 150–379)
RBC: 4.63 x10E6/uL (ref 3.77–5.28)
RDW: 14.6 % (ref 12.3–15.4)
WBC: 9.1 10*3/uL (ref 3.4–10.8)

## 2015-11-26 LAB — THYROID PANEL WITH TSH
Free Thyroxine Index: 2.8 (ref 1.2–4.9)
T3 Uptake Ratio: 23 % — ABNORMAL LOW (ref 24–39)
T4, Total: 12 ug/dL (ref 4.5–12.0)
TSH: 1.65 u[IU]/mL (ref 0.450–4.500)

## 2015-11-26 LAB — HEPATIC FUNCTION PANEL
ALT: 23 IU/L (ref 0–32)
AST: 38 IU/L (ref 0–40)
Albumin: 3.9 g/dL (ref 3.6–4.8)
Alkaline Phosphatase: 65 IU/L (ref 39–117)
Bilirubin Total: 0.3 mg/dL (ref 0.0–1.2)
Bilirubin, Direct: 0.12 mg/dL (ref 0.00–0.40)
Total Protein: 6.9 g/dL (ref 6.0–8.5)

## 2015-11-26 LAB — BMP8+EGFR
BUN/Creatinine Ratio: 19 (ref 12–28)
BUN: 10 mg/dL (ref 8–27)
CO2: 24 mmol/L (ref 18–29)
Calcium: 9.8 mg/dL (ref 8.7–10.3)
Chloride: 102 mmol/L (ref 96–106)
Creatinine, Ser: 0.54 mg/dL — ABNORMAL LOW (ref 0.57–1.00)
GFR calc Af Amer: 112 mL/min/{1.73_m2} (ref >59)
GFR calc non Af Amer: 97 mL/min/{1.73_m2} (ref >59)
Glucose: 155 mg/dL — ABNORMAL HIGH (ref 65–99)
Potassium: 5 mmol/L (ref 3.5–5.2)
Sodium: 143 mmol/L (ref 134–144)

## 2015-11-26 LAB — VITAMIN D 25 HYDROXY (VIT D DEFICIENCY, FRACTURES): Vit D, 25-Hydroxy: 63.7 ng/mL (ref 30.0–100.0)

## 2015-11-26 LAB — LIPID PANEL
Chol/HDL Ratio: 3.1 ratio units (ref 0.0–4.4)
Cholesterol, Total: 106 mg/dL (ref 100–199)
HDL: 34 mg/dL — ABNORMAL LOW (ref >39)
LDL Calculated: 39 mg/dL (ref 0–99)
Triglycerides: 166 mg/dL — ABNORMAL HIGH (ref 0–149)
VLDL Cholesterol Cal: 33 mg/dL (ref 5–40)

## 2015-11-26 LAB — MICROALBUMIN / CREATININE URINE RATIO
Creatinine, Urine: 79.4 mg/dL
Microalb/Creat Ratio: 10.2 mg/g creat (ref 0.0–30.0)
Microalbumin, Urine: 8.1 ug/mL

## 2015-11-30 ENCOUNTER — Other Ambulatory Visit: Payer: Self-pay | Admitting: Neurology

## 2015-11-30 ENCOUNTER — Other Ambulatory Visit: Payer: Self-pay | Admitting: Family Medicine

## 2015-12-08 ENCOUNTER — Other Ambulatory Visit: Payer: Self-pay | Admitting: Pharmacist

## 2015-12-08 ENCOUNTER — Ambulatory Visit (INDEPENDENT_AMBULATORY_CARE_PROVIDER_SITE_OTHER): Payer: Medicare Other | Admitting: Pharmacist

## 2015-12-08 ENCOUNTER — Encounter: Payer: Self-pay | Admitting: Pharmacist

## 2015-12-08 VITALS — BP 134/82 | HR 84

## 2015-12-08 DIAGNOSIS — E119 Type 2 diabetes mellitus without complications: Secondary | ICD-10-CM | POA: Diagnosis not present

## 2015-12-08 DIAGNOSIS — Z79899 Other long term (current) drug therapy: Secondary | ICD-10-CM

## 2015-12-08 MED ORDER — SITAGLIPTIN PHOSPHATE 100 MG PO TABS: 100.0000 mg | ORAL_TABLET | Freq: Every day | ORAL | 1 refills | Status: DC

## 2015-12-08 MED ORDER — METFORMIN HCL ER 500 MG PO TB24: 500.0000 mg | ORAL_TABLET | Freq: Every day | ORAL | 1 refills | Status: DC

## 2015-12-08 NOTE — Patient Instructions (Signed)
Add Januvia 100mg  - take 1 tablet daily Decrease metformin XR to 500mg  - take 1 tablet each morning with breakfast.   Check amount of folic acid supplement - recommend total of 1mg  daily  Goal Blood glucose:    Fasting (before meals) = 80 to 130   Within 2 hours of eating = less than 180  Try to have no more than 3 of these foods per meal:  Fruit:   1/2 cup or once piece (baseball size)- apples, pears, pineapple, peaches, oranges  1 cup - berries, melons  1/2 banana or grapefruit  Stachy Vegetables:   1/2 cup potatoes (white or sweet), corn, peas  Other starches:   1 piece of bread  1/2 cup rice or pasta  4 inch pancake    These foods you can eat more freely: Proteins:   Fish  Chicken or [place]  Beef or pork (1 or 2 servings per week)  Eggs  Nuts (peanuts, walnuts, almonds, pistachios)  Cheese  Non starchy vegetables:  Green beans  Broccoli or cauliflower  Lettuce, greens, cabbage  Brussel Sprout  Carrots  Onions and peppers  Celery  Tomatoes  Asparagus  Eggplant  Cucumbers  Squash and Zucchini

## 2015-12-08 NOTE — Progress Notes (Signed)
Patient ID: JAZA ONSUREZ, female   DOB: 1946-02-14, 70 y.o.   MRN: [id number]  Subjective:    [name] is a 70 y.o. female who presents for a follow up evaluation of Type 2 diabetes mellitus.   She had an A1c of 8.4% (11/25/2015) She is currently taking metformin XR 500mg  bid.   HBG readings are usually in the 190's per her husband and this is usually fasting in am.   Patient has IBS-D and takes several meds for this - Bentyl, Immodium prn and probiotic.   Husband also has question about how much folic acid he was suppose to be giving patient.  He states that he has OTC folic acid which was recommended by his pharmacist who also instructed him to give folic acid tid.  Patient thought this meant to give 1mg  tid.    Known diabetic complications: cerebrovascular disease Cardiovascular risk factors: history of CVA   Eye exam current (within one year): yes Weight trend: stable Prior visit with dietician: no Current diet: in general, an "unhealthy" diet; patient likes English muffins, cheerios, pasta and potatoes.  She doesn't like to eat much meat or vegetables.  Husband reports this is a change that started after she has CVA Current exercise: none  Current monitoring regimen: home blood tests - 1 to 2  times daily Any episodes of hypoglycemia? no  Is She on ACE inhibitor or angiotensin II receptor blocker?  No  The following portions of the patient's history were reviewed and updated as appropriate: allergies, current medications, past medical history and problem list.    Objective:    BP 134/82   Pulse 84   A1c = 8.4% (11/25/2015) Lab Review Glucose (mg/dL)  Date Value  11/25/2015 155 (H)  07/14/2015 120 (H)  03/19/2015 159 (H)   Glucose, Bld (mg/dL)  Date Value  03/24/2013 154 (H)  07/05/2012 132 (H)  06/27/2012 116 (H)   CO2 (mmol/L)  Date Value  11/25/2015 24  07/14/2015 22  03/19/2015 23   BUN (mg/dL)  Date Value  11/25/2015 10  07/14/2015 7 (L)   03/19/2015 8   Creat (mg/dL)  Date Value  06/27/2012 0.63   Creatinine, Ser (mg/dL)  Date Value  11/25/2015 0.54 (L)  07/14/2015 0.47 (L)  03/19/2015 0.56 (L)    Assessment:    Diabetes Mellitus type II, under inadequate control.   Medication Management - needs help with folic acid dosing IBS-D possibly worsened by metformin     Plan:    1.  Rx changes:   Start Januvia 100mg  take 1 tablet daily  Decrease metformin to 500mg  XR take 1 tablet daily to see if loose stools improved.   Advised that correct folic acid dose is 1mg  daily.  The probably have 460mcg or 0.4mg  strength at home is thery have OTC folic acid in which case she would take 3 tablets daily to equal 1.2mg  per day. 2.  Education: Reviewed 'ABCs' of diabetes management (respective goals in parentheses):  A1C (<7), blood pressure (<130/80), and cholesterol (LDL <100). 3.  Reviewed CHO counting diet, recommended serving sizes and increasing low CHO foods.  Encouraged her to add more vegetables and to limit serving sizes of snacks 4.  Physical activity as able (right sided paralysis post CVA) 5.  RTC in 2 weeks for PAP and 2 months to see PCP.  They are to all if BG is nto improving so I can recommend changes if needed.  Cherre Robins, PharmD, CPP, CDE  Patient ID: [name], female   DOB: Feb 07, 1946, 70 y.o.   MRN: [id number]

## 2015-12-11 ENCOUNTER — Other Ambulatory Visit: Payer: Self-pay | Admitting: Family Medicine

## 2015-12-16 ENCOUNTER — Other Ambulatory Visit: Payer: Self-pay | Admitting: Family Medicine

## 2015-12-21 ENCOUNTER — Telehealth: Payer: Self-pay

## 2015-12-21 MED ORDER — LINAGLIPTIN 5 MG PO TABS: 5.0000 mg | ORAL_TABLET | Freq: Every day | ORAL | 1 refills | Status: DC

## 2015-12-21 NOTE — Telephone Encounter (Signed)
Recommend change to Tradjenta 5mg  - take 1 tablet daily since it is in same drug class as Januvia.  Rx sent to pharmacy and patient notified.

## 2015-12-23 ENCOUNTER — Encounter: Payer: Self-pay | Admitting: Nurse Practitioner

## 2015-12-23 ENCOUNTER — Ambulatory Visit (INDEPENDENT_AMBULATORY_CARE_PROVIDER_SITE_OTHER): Payer: Medicare Other | Admitting: Nurse Practitioner

## 2015-12-23 VITALS — BP 136/77 | HR 70 | Temp 96.9°F | Ht 64.0 in | Wt 178.0 lb

## 2015-12-23 DIAGNOSIS — Z01419 Encounter for gynecological examination (general) (routine) without abnormal findings: Secondary | ICD-10-CM | POA: Diagnosis not present

## 2015-12-23 NOTE — Progress Notes (Signed)
   Subjective:    Patient ID: [name], female    DOB: October 05, 1945, 70 y.o.   MRN: [id number]  HPI This is a regular patient of Dr. Laurance Flatten that comes in today for pelvic exam. SHe was last seen for follow up by Dr. Laurance Flatten 11/25/15. She  Had a stroke several years ago and has not been able to do much since then. Her husband says she stays in the house most of the time. She is not  able to do her own ADL's nor take care of the house leaving her husband to do most of that. Her daughter will come over and do her hair and makeup and help her take a bath.    Review of Systems  Constitutional: Negative.   HENT: Negative.   Respiratory: Negative.   Cardiovascular: Negative.   Genitourinary: Negative.   Neurological: Negative.   Psychiatric/Behavioral: Negative.   All other systems reviewed and are negative.      Objective:   Physical Exam  Constitutional: She is oriented to person, place, and time. She appears well-developed and well-nourished. No distress.  Cardiovascular: Normal rate, regular rhythm and normal heart sounds.   Pulmonary/Chest: Effort normal and breath sounds normal.  Genitourinary:  Genitourinary Comments: Vaginal muca dry and tight No vaginal discharge Blind pap performed  Neurological: She is alert and oriented to person, place, and time.  Skin: Skin is warm.  Psychiatric: She has a normal mood and affect. Her behavior is normal. Judgment and thought content normal.   BP 136/77   Pulse 70   Temp (!) 96.9 F (36.1 C) (Oral)   Ht 5\' 4"  (1.626 m)   Wt 178 lb (80.7 kg)   BMI 30.55 kg/m         Assessment & Plan:  1. Encounter for gynecological examination without abnormal finding Keep follow up appointment with Dr. Laurance Flatten - Pap IG (Image Guided)   Mary-Margaret Hassell Done, FNP

## 2015-12-28 ENCOUNTER — Other Ambulatory Visit: Payer: Self-pay | Admitting: Family Medicine

## 2015-12-28 LAB — PAP IG (IMAGE GUIDED): PAP Smear Comment: 0

## 2015-12-29 ENCOUNTER — Telehealth: Payer: Self-pay | Admitting: Family Medicine

## 2015-12-29 NOTE — Telephone Encounter (Signed)
I left a message for [name] and there was NA x 2 at the pt's home.

## 2015-12-29 NOTE — Telephone Encounter (Signed)
Tammy, can  You check into this since she sees you and [facility].  Thanks

## 2015-12-29 NOTE — Telephone Encounter (Signed)
Tradjenta does not cause drowsiness.  If he BG is elevated that could make her more fatigued.  Please see if they can report some BG glucose readings from the last week.

## 2015-12-30 ENCOUNTER — Other Ambulatory Visit: Payer: Self-pay | Admitting: Internal Medicine

## 2015-12-30 ENCOUNTER — Other Ambulatory Visit: Payer: Self-pay | Admitting: Family Medicine

## 2016-01-03 NOTE — Telephone Encounter (Signed)
Reading are as follow - all fasting - morning reading since 12/22/15.  1st-214       203       194       200       187      217       206     228       227      200      214 Today -211   We will have Tammy address when she is available. Husband aware to continue same meds unless we tell them different

## 2016-01-06 ENCOUNTER — Telehealth: Payer: Self-pay | Admitting: Cardiology

## 2016-01-06 ENCOUNTER — Encounter: Payer: Medicare Other | Admitting: *Deleted

## 2016-01-06 NOTE — Telephone Encounter (Signed)
LMOVM reminding pt to send remote transmission.   

## 2016-01-07 ENCOUNTER — Telehealth: Payer: Self-pay | Admitting: Family Medicine

## 2016-01-07 NOTE — Telephone Encounter (Signed)
Left message to call office to discuss medications and HBG readings.

## 2016-01-11 IMAGING — CR DG LUMBAR SPINE 2-3V
2 series · 2 of 2 positions shown · non-contrast
Comparison: None.

CLINICAL DATA: Acute low back pain with sciatica

EXAM:
LUMBAR SPINE - 2-3 VIEW

[view not recorded (1 of 2)]
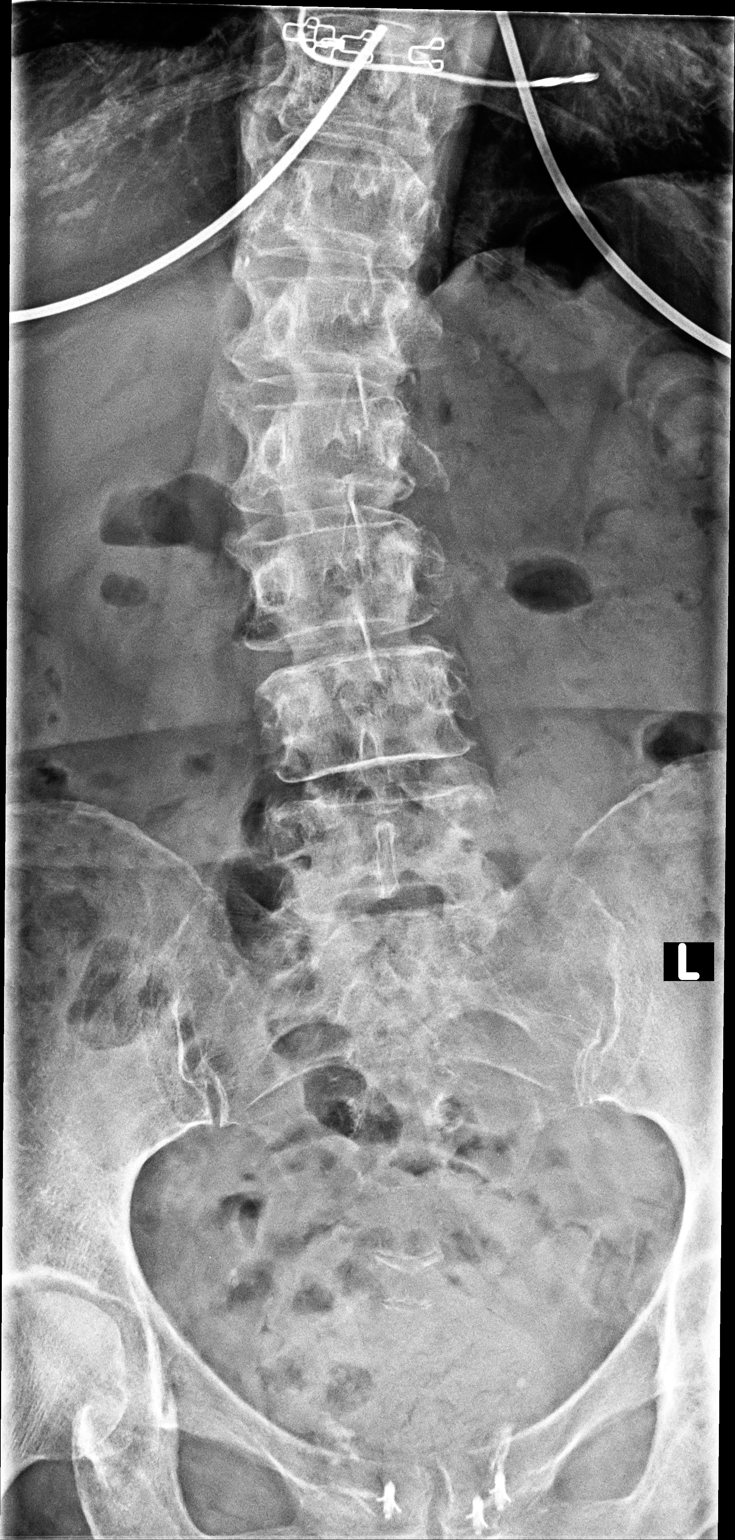

[view not recorded (2 of 2)]
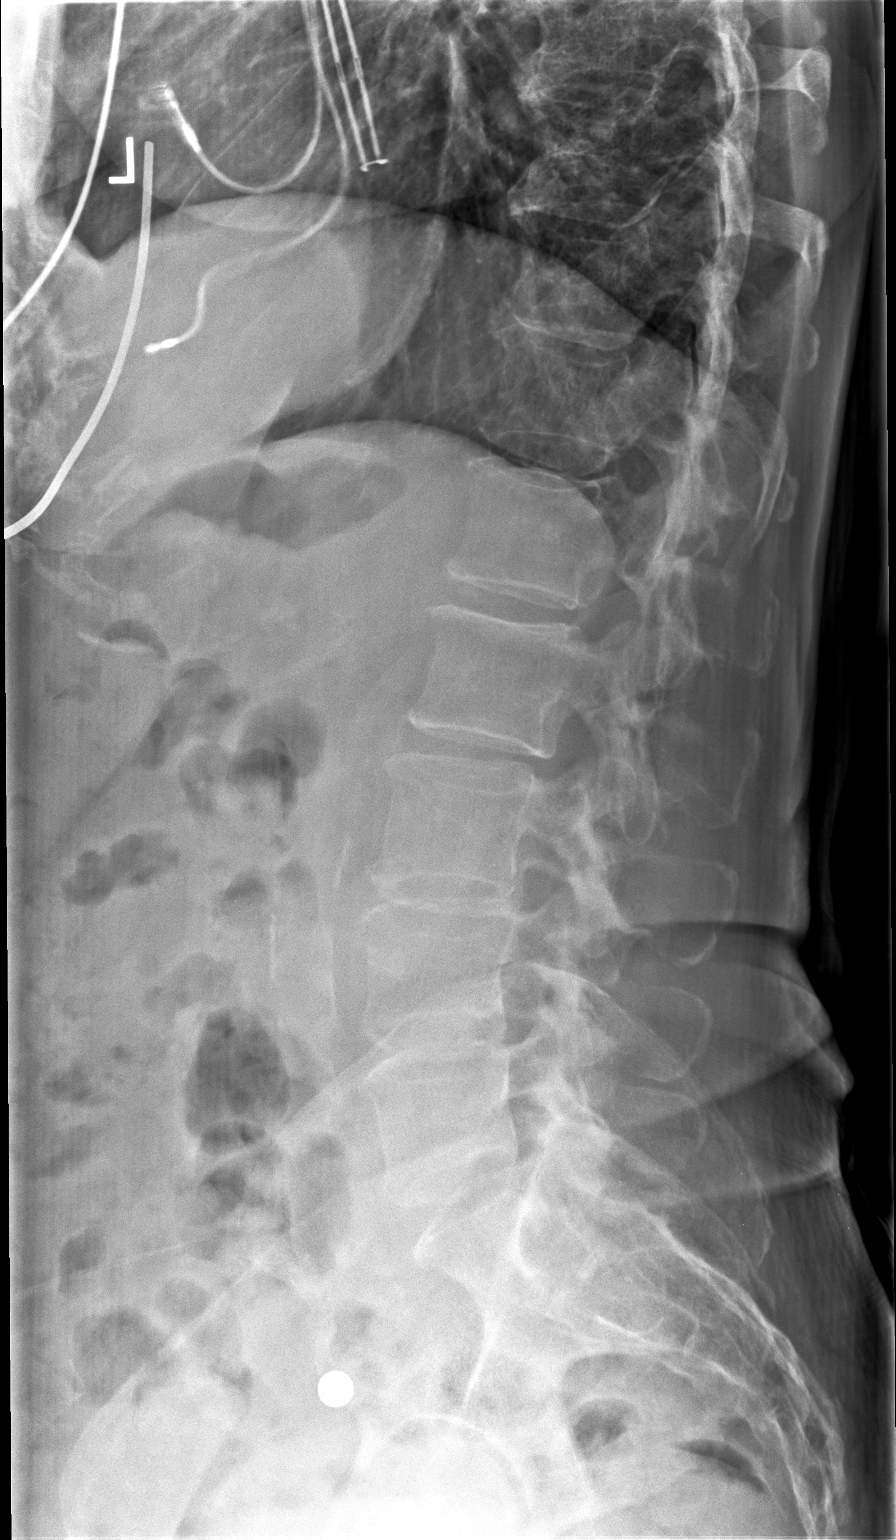

[2 of 2 positions shown; findings below may reference images not displayed]

FINDINGS: Dextroscoliosis noted of the lumbar spine. Diffuse endplate
degenerative changes and osteophytes. No compression fracture,
wedge-shaped deformity or focal kyphosis. Facets aligned.
Atherosclerosis of the aorta. Normal bowel gas pattern without
obstruction.
IMPRESSION: Mild degenerative changes and dextroscoliosis. No acute osseous
finding by plain radiography.

## 2016-01-11 MED ORDER — METFORMIN HCL ER 500 MG PO TB24: 1000.0000 mg | ORAL_TABLET | Freq: Every day | ORAL | 1 refills | Status: DC

## 2016-01-11 NOTE — Telephone Encounter (Signed)
Patient's BG is still over 200 and her loose stools did not change with dose decrease of metformin. She would like to ge back to 2 metformin daily.  Ok to increase metformin XR to 2 tablets daily.

## 2016-01-19 ENCOUNTER — Other Ambulatory Visit: Payer: Self-pay | Admitting: Family Medicine

## 2016-02-01 ENCOUNTER — Other Ambulatory Visit: Payer: Self-pay | Admitting: Family Medicine

## 2016-02-18 ENCOUNTER — Telehealth: Payer: Self-pay | Admitting: Family Medicine

## 2016-02-18 MED ORDER — AZITHROMYCIN 250 MG PO TABS: ORAL_TABLET | ORAL | 0 refills | Status: DC

## 2016-02-18 NOTE — Telephone Encounter (Signed)
Husband [name] called stating that patient has had cough and chest congestion since yesterday. Patient has been using musinex but it is not helping any. Patient states that Melfa is aware that it normally turns into bronchitis and would like a antibiotic sent to [facility] in [place]. Please advise and send back to the pools.

## 2016-02-18 NOTE — Telephone Encounter (Signed)
Please call a prescription in for a Z-Pak to take as directed. Make sure that she drinks plenty of fluids and stays well-hydrated. She can take plain Mucinex maximum strength, blue and white in color, 1 twice daily with a large glass of water. Make sure that she is not using any overhead fans.   The house should be kept as cool as possible. A cool mist humidifier would also be helpful.

## 2016-02-18 NOTE — Telephone Encounter (Signed)
Rx sent into pharmacy. Husband aware and verbalizes understanding.

## 2016-02-23 ENCOUNTER — Other Ambulatory Visit: Payer: Self-pay | Admitting: Family Medicine

## 2016-03-16 ENCOUNTER — Other Ambulatory Visit: Payer: Self-pay | Admitting: Family Medicine

## 2016-03-17 ENCOUNTER — Other Ambulatory Visit: Payer: Self-pay | Admitting: Nurse Practitioner

## 2016-03-26 ENCOUNTER — Other Ambulatory Visit: Payer: Self-pay | Admitting: Family Medicine

## 2016-03-31 ENCOUNTER — Encounter: Payer: Self-pay | Admitting: Family Medicine

## 2016-03-31 ENCOUNTER — Ambulatory Visit (INDEPENDENT_AMBULATORY_CARE_PROVIDER_SITE_OTHER): Payer: Medicare Other | Admitting: Family Medicine

## 2016-03-31 VITALS — BP 142/88 | HR 72 | Temp 97.0°F | Ht 64.0 in | Wt 178.0 lb

## 2016-03-31 DIAGNOSIS — I6349 Cerebral infarction due to embolism of other cerebral artery: Secondary | ICD-10-CM

## 2016-03-31 DIAGNOSIS — I6930 Unspecified sequelae of cerebral infarction: Secondary | ICD-10-CM | POA: Diagnosis not present

## 2016-03-31 DIAGNOSIS — I10 Essential (primary) hypertension: Secondary | ICD-10-CM | POA: Diagnosis not present

## 2016-03-31 DIAGNOSIS — E559 Vitamin D deficiency, unspecified: Secondary | ICD-10-CM

## 2016-03-31 DIAGNOSIS — E034 Atrophy of thyroid (acquired): Secondary | ICD-10-CM | POA: Diagnosis not present

## 2016-03-31 DIAGNOSIS — E119 Type 2 diabetes mellitus without complications: Secondary | ICD-10-CM

## 2016-03-31 DIAGNOSIS — K439 Ventral hernia without obstruction or gangrene: Secondary | ICD-10-CM

## 2016-03-31 DIAGNOSIS — I480 Paroxysmal atrial fibrillation: Secondary | ICD-10-CM | POA: Diagnosis not present

## 2016-03-31 DIAGNOSIS — E785 Hyperlipidemia, unspecified: Secondary | ICD-10-CM | POA: Diagnosis not present

## 2016-03-31 LAB — BAYER DCA HB A1C WAIVED: HB A1C (BAYER DCA - WAIVED): 8.9 % — ABNORMAL HIGH (ref <7.0)

## 2016-03-31 NOTE — Patient Instructions (Addendum)
Medicare Annual Wellness Visit  [facility] and the medical providers at [facility] strive to bring you the best medical care.  In doing so we not only want to address your current medical conditions and concerns but also to detect new conditions early and prevent illness, disease and health-related problems.    Medicare offers a yearly Wellness Visit which allows our clinical staff to assess your need for preventative services including immunizations, lifestyle education, counseling to decrease risk of preventable diseases and screening for fall risk and other medical concerns.    This visit is provided free of charge (no copay) for all Medicare recipients. The clinical pharmacists at [facility] have begun to conduct these Wellness Visits which will also include a thorough review of all your medications.    As you primary medical provider recommend that you make an appointment for your Annual Wellness Visit if you have not done so already this year.  You may set up this appointment before you leave today or you may call back [phone] and schedule an appointment.  Please make sure when you call that you mention that you are scheduling your Annual Wellness Visit with the clinical pharmacist so that the appointment may be made for the proper length of time.    Continue current medications. Continue good therapeutic lifestyle changes which include good diet and exercise. Fall precautions discussed with patient. If an FOBT was given today- please return it to our front desk. If you are over 21 years old - you may need Prevnar 58 or the adult Pneumonia vaccine.  **Flu shots are available--- please call and schedule a FLU-CLINIC appointment**  After your visit with [place] today you will receive a survey in the mail or online from [employer] regarding your care with [place]. Please take a moment to fill this out. Your feedback is very  important to [place] as you can help [place] better understand your patient needs as well as improve your experience and satisfaction. WE CARE ABOUT YOU!!!   For the rest of the flu season use good respiratory and hand hygiene Use a cool mist humidification as doing Use nasal saline during the day and nasal saline gel at nighttime Reschedule appointment for check of the ventral hernia with Dr. gross so that this could be repaired Follow-up with ophthalmologist as planned Committed to a good exercise program even if it's not aggressive some exercises better than none at all

## 2016-03-31 NOTE — Progress Notes (Signed)
Subjective:    Patient ID: [name], female    DOB: 1945/12/06, 71 y.o.   MRN: [id number]  HPI Pt here for follow up and management of chronic medical problems which includes diabetes, hyperlipidemia, and hypertension. She is taking medication regularly.The patient comes with her husband to the visit today. They bring in blood sugars and blood pressure readings from the outside. The majority of the fasting blood sugars seem to be running in the 180 to the low 200 range. This goes back through last October. Vaccine to be higher more recently than they were back in October. The patient is concerned that her metoprolol may be upsetting her stomach. We will discuss if there is a way to alleviate this. She is currently taking 50 mg twice daily. The patient denies chest pain or shortness of breath anymore than usual. She's not having any trouble with her stomach other than some loose stools which they associate with taking metoprolol. We will have the clinical pharmacist call the patient about suggestions about how to correct this if possible. She did not take the metoprolol today because a lot of times after the metoprolol she does have a loose bowel movement. Her blood pressure is up. A repeat blood pressure by me was also elevated. Will not make any changes today. She is passing her water without problems.    Patient Active Problem List   Diagnosis Date Noted  . Type 2 diabetes mellitus ([place]) 05/17/2015  . Cerebrovascular accident (CVA) due to embolism of left middle cerebral artery ([place]) 11/24/2014  . Focal and partial seizures ([place]) 11/24/2014  . Depression 11/24/2014  . Cerebral infarction due to embolism of other cerebral artery ([place]) 09/03/2013  . Metabolic syndrome 62/56/3893  . High risk medication use 03/13/2013  . Fever, unspecified 06/05/2012  . Generalized abdominal pain 06/05/2012  . PAF (paroxysmal atrial fibrillation) ([employer]) 02/07/2012  . Tachy-brady syndrome ([place])   . Impaired  glucose tolerance   . Fatigue 12/22/2009  . SNORING 12/22/2009  . OTHER CHRONIC NONALCOHOLIC LIVER DISEASE 73/42/8768  . NONSPEC ELEVATION OF LEVELS OF TRANSAMINASE/LDH 03/09/2009  . PPM-[place] 09/01/2008  . BRADYCARDIA 08/13/2008  . Hypothyroidism 06/09/2008  . HTN (hypertension) 06/09/2008  . Atrial fibrillation ([place]) 06/09/2008  . PREMATURE VENTRICULAR CONTRACTIONS 06/09/2008  . IRRITABLE BOWEL SYNDROME 06/09/2008  . PSORIASIS 06/09/2008  . EDEMA 06/09/2008  . DIVERTICULOSIS, COLON, HX OF 06/09/2008   Outpatient Encounter Prescriptions as of 03/31/2016  Medication Sig  . acetaminophen (TYLENOL) 500 MG tablet Take 500 mg by mouth every 6 (six) hours as needed (pain).   . Ascorbic Acid (VITAMIN C PO) Take 1 tablet by mouth daily.  . Calcium Carbonate-Vitamin D (CALCIUM-VITAMIN D) 500-200 MG-UNIT per tablet Take 1 tablet by mouth daily.  . cholecalciferol (VITAMIN D) 1000 UNITS tablet Take 2,000 Units by mouth daily.   . citalopram (CELEXA) 20 MG tablet TAKE 1 TABLET(20 MG) BY MOUTH TWICE DAILY  . clobetasol cream (TEMOVATE) 0.05 % Apply to affected areas on body 1-2 times daily as needed. Never to face.  . cyclobenzaprine (FLEXERIL) 5 MG tablet Take 1 tablet (5 mg total) by mouth 3 (three) times daily as needed for muscle spasms.  [place] dicyclomine (BENTYL) 10 MG capsule TAKE 1 CAPSULE BY MOUTH FOUR TIMES DAILY BEFORE MEALS AND AT BEDTIME  . fluticasone (FLONASE) 50 MCG/ACT nasal spray SHAKE LIQUID AND USE 2 SPRAYS IN EACH NOSTRIL DAILY  . folic acid (FOLVITE) 1 MG tablet TAKE 1 TABLET(1 MG) BY MOUTH  DAILY  . levETIRAcetam (KEPPRA) 500 MG tablet TAKE 1 TABLET BY MOUTH TWICE DAILY  . levothyroxine (SYNTHROID, LEVOTHROID) 88 MCG tablet TAKE 1 TABLET BY MOUTH EVERY DAY BEFORE BREAKFAST  . loperamide (IMODIUM) 2 MG capsule Take by mouth as needed for diarrhea or loose stools. Reported on 05/17/2015  . metFORMIN (GLUCOPHAGE XR) 500 MG 24 hr tablet Take 2 tablets (1,000 mg total) by mouth daily  with breakfast.  . methotrexate (RHEUMATREX) 2.5 MG tablet Take 4 tablets (10 mg total) by mouth once a week. Caution:Chemotherapy. Protect from light. (Patient taking differently: 7.5 mg. Take 4 tablets (10 mg total) by mouth once a week. Caution:Chemotherapy. Protect from light.)  . metoprolol (LOPRESSOR) 50 MG tablet TAKE 1 TABLET(50 MG) BY MOUTH TWICE DAILY  . miconazole (MICOTIN) 2 % cream Apply 1 application topically 2 (two) times daily.  . mirtazapine (REMERON) 15 MG tablet TAKE 1 TABLET(15 MG) BY MOUTH AT BEDTIME  . Multiple Vitamins-Minerals (EYE VITAMINS PO) Take 2 tablets by mouth daily. AREDS formula  . NYSTATIN powder APPLY TWICE DAILY UNDERNEATH BREASTS  . omeprazole (PRILOSEC) 40 MG capsule TAKE 1 CAPSULE BY MOUTH EVERY DAY  . ONE TOUCH ULTRA TEST test strip TEST BLOOD SUGAR ONCE A DAY AS NEEDED  . potassium chloride SA (K-DUR,KLOR-CON) 20 MEQ tablet TAKE 2 TABLETS BY MOUTH TWICE DAILY  . Probiotic Product (PROBIOTIC PO) Take 1 capsule by mouth daily.  . rivaroxaban (XARELTO) 20 MG TABS tablet Take 1 tablet (20 mg total) by mouth daily.  . simvastatin (ZOCOR) 40 MG tablet Take 1 tablet (40 mg total) by mouth daily.  . traMADol (ULTRAM) 50 MG tablet Take 1/2 tab QID PRN for severe pain.  . [DISCONTINUED] azithromycin (ZITHROMAX) 250 MG tablet Take as directed.  . [DISCONTINUED] linagliptin (TRADJENTA) 5 MG TABS tablet Take 1 tablet (5 mg total) by mouth daily. Replaces Januvia which was not covered by insurance.  . [DISCONTINUED] XARELTO 20 MG TABS tablet TAKE 1 TABLET BY MOUTH EVERY DAY   No facility-administered encounter medications on file as of 03/31/2016.      Review of Systems  Constitutional: Negative.   HENT: Positive for postnasal drip and sneezing.   Eyes: Positive for visual disturbance (macular degeneration).  Respiratory: Negative.   Cardiovascular: Negative.   Gastrointestinal: Positive for abdominal pain (hernia).  Endocrine: Negative.   Genitourinary:  Negative.   Musculoskeletal: Negative.   Skin: Negative.   Allergic/Immunologic: Negative.   Neurological: Positive for headaches.  Hematological: Negative.   Psychiatric/Behavioral: Negative.        Objective:   Physical Exam  Constitutional: She is oriented to person, place, and time. She appears well-developed and well-nourished.  The patient is pleasant and responds appropriately to questions asked what further but is not able to express herself. She understands the question but has trouble saying what she needs to say and her husband speaks for her.  HENT:  Head: Normocephalic and atraumatic.  Right Ear: External ear normal.  Left Ear: External ear normal.  Mouth/Throat: Oropharynx is clear and moist. No oropharyngeal exudate.  Nasal dryness bilaterally left greater than right  Eyes: Conjunctivae and EOM are normal. Pupils are equal, round, and reactive to light. Right eye exhibits no discharge. Left eye exhibits no discharge. No scleral icterus.  She's been seeing the ophthalmologist in has macular degeneration.  Neck: Normal range of motion. Neck supple. No thyromegaly present.  Cardiovascular: Normal rate, regular rhythm, normal heart sounds and intact distal pulses.   No murmur  heard. The heart is regular today at 72/m  Pulmonary/Chest: Effort normal and breath sounds normal. No respiratory distress. She has no wheezes. She has no rales.  Clear anteriorly and posteriorly  Abdominal: Soft. Bowel sounds are normal. She exhibits no mass. There is no tenderness. There is no rebound and no guarding.  The patient has a large ventral hernia. It is tender to palpation. She and her husband will call the surgeon to arrange for repair of this on an outpatient basis sometime later in the spring.  Musculoskeletal: Normal range of motion. She exhibits no edema or tenderness.  She uses a cane for ambulation and has diminished use of her right side and she has her right arm in a sling because  of the stroke that she's experienced in the past.  Lymphadenopathy:    She has no cervical adenopathy.  Neurological: She is alert and oriented to person, place, and time. She has normal reflexes. No cranial nerve deficit.  Right-sided weakness  Skin: Skin is warm and dry. No rash noted.  Psychiatric: She has a normal mood and affect. Her behavior is normal. Judgment and thought content normal.  Nursing note and vitals reviewed.   BP (!) 144/76 (BP Location: Left Arm)   Pulse 72   Temp 97 F (36.1 C) (Oral)   Ht _0  (1.626 m)   Wt 178 lb (80.7 kg)   BMI 30.55 kg/m        Assessment & Plan:  1. Type 2 diabetes mellitus without complication, without long-term current use of insulin (HCC) -Continue with current treatment pending results of A1c - BMP8+EGFR - CBC with Differential/Platelet - Bayer DCA Hb A1c Waived  2. Hyperlipidemia, unspecified hyperlipidemia type -Continue with current treatment and aggressive therapeutic lifestyle changes as possible pending results of lab work - CBC with Differential/Platelet - Lipid panel  3. Essential hypertension -The blood pressure was elevated on the systolic side today but the patient was unable to take her metoprolol this morning due to worrying about the side effects of loose bowel movements. -We will have the clinical pharmacist to call her about any suggestions in adjusting the Toprol or possibly taken a half a one twice a day instead of a whole one. - BMP8+EGFR - CBC with Differential/Platelet - Hepatic function panel  4. Vitamin D deficiency -Continue current treatment pending results of lab work - CBC with Differential/Platelet - VITAMIN D 25 Hydroxy (Vit-D Deficiency, Fractures)  5. PAF (paroxysmal atrial fibrillation) ([place]) -Continue follow-up with cardiology - CBC with Differential/Platelet  6. Hypothyroidism due to acquired atrophy of thyroid - continue current treatment - CBC with Differential/Platelet  7.  Cerebral infarction due to embolism of other cerebral artery [facility]) -Patient has right-sided weakness and still has an inability to express herself correctly although she understands fully she has trouble with expressive aphasia. - CBC with Differential/Platelet  8. Ventral hernia without obstruction or gangrene -The family will call the surgeon and schedule a visit to arrange outpatient surgery later this spring.  Patient Instructions                       Medicare Annual Wellness Visit  [facility] and the medical providers at [facility] strive to bring you the best medical care.  In doing so we not only want to address your current medical conditions and concerns but also to detect new conditions early and prevent illness, disease and health-related problems.    Medicare  offers a yearly Wellness Visit which allows our clinical staff to assess your need for preventative services including immunizations, lifestyle education, counseling to decrease risk of preventable diseases and screening for fall risk and other medical concerns.    This visit is provided free of charge (no copay) for all Medicare recipients. The clinical pharmacists at [facility] have begun to conduct these Wellness Visits which will also include a thorough review of all your medications.    As you primary medical provider recommend that you make an appointment for your Annual Wellness Visit if you have not done so already this year.  You may set up this appointment before you leave today or you may call back ([phone]) and schedule an appointment.  Please make sure when you call that you mention that you are scheduling your Annual Wellness Visit with the clinical pharmacist so that the appointment may be made for the proper length of time.    Continue current medications. Continue good therapeutic lifestyle changes which include good diet and exercise. Fall precautions discussed  with patient. If an FOBT was given today- please return it to our front desk. If you are over 95 years old - you may need Prevnar 46 or the adult Pneumonia vaccine.  **Flu shots are available--- please call and schedule a FLU-CLINIC appointment**  After your visit with [place] today you will receive a survey in the mail or online from [employer] regarding your care with [place]. Please take a moment to fill this out. Your feedback is very important to [place] as you can help [place] better understand your patient needs as well as improve your experience and satisfaction. WE CARE ABOUT YOU!!!   For the rest of the flu season use good respiratory and hand hygiene Use a cool mist humidification as doing Use nasal saline during the day and nasal saline gel at nighttime Reschedule appointment for check of the ventral hernia with Dr. gross so that this could be repaired Follow-up with ophthalmologist as planned Committed to a good exercise program even if it's not aggressive some exercises better than none at all    Arrie Senate MD

## 2016-04-01 LAB — HEPATIC FUNCTION PANEL
ALT: 19 IU/L (ref 0–32)
AST: 32 IU/L (ref 0–40)
Albumin: 4.2 g/dL (ref 3.6–4.8)
Alkaline Phosphatase: 60 IU/L (ref 39–117)
Bilirubin Total: 0.3 mg/dL (ref 0.0–1.2)
Bilirubin, Direct: 0.13 mg/dL (ref 0.00–0.40)
Total Protein: 6.7 g/dL (ref 6.0–8.5)

## 2016-04-01 LAB — CBC WITH DIFFERENTIAL/PLATELET
Basophils Absolute: 0 10*3/uL (ref 0.0–0.2)
Basos: 0 %
EOS (ABSOLUTE): 0.4 10*3/uL (ref 0.0–0.4)
Eos: 5 %
Hematocrit: 41.1 % (ref 34.0–46.6)
Hemoglobin: 13.4 g/dL (ref 11.1–15.9)
Immature Grans (Abs): 0 10*3/uL (ref 0.0–0.1)
Immature Granulocytes: 0 %
Lymphocytes Absolute: 3 10*3/uL (ref 0.7–3.1)
Lymphs: 32 %
MCH: 29.5 pg (ref 26.6–33.0)
MCHC: 32.6 g/dL (ref 31.5–35.7)
MCV: 90 fL (ref 79–97)
Monocytes Absolute: 1 10*3/uL — ABNORMAL HIGH (ref 0.1–0.9)
Monocytes: 11 %
Neutrophils Absolute: 4.7 10*3/uL (ref 1.4–7.0)
Neutrophils: 52 %
Platelets: 247 10*3/uL (ref 150–379)
RBC: 4.55 x10E6/uL (ref 3.77–5.28)
RDW: 14.4 % (ref 12.3–15.4)
WBC: 9.2 10*3/uL (ref 3.4–10.8)

## 2016-04-01 LAB — LIPID PANEL
Chol/HDL Ratio: 3 ratio units (ref 0.0–4.4)
Cholesterol, Total: 104 mg/dL (ref 100–199)
HDL: 35 mg/dL — ABNORMAL LOW (ref >39)
LDL Calculated: 33 mg/dL (ref 0–99)
Triglycerides: 181 mg/dL — ABNORMAL HIGH (ref 0–149)
VLDL Cholesterol Cal: 36 mg/dL (ref 5–40)

## 2016-04-01 LAB — BMP8+EGFR
BUN/Creatinine Ratio: 25 (ref 12–28)
BUN: 11 mg/dL (ref 8–27)
CO2: 24 mmol/L (ref 18–29)
Calcium: 9.8 mg/dL (ref 8.7–10.3)
Chloride: 98 mmol/L (ref 96–106)
Creatinine, Ser: 0.44 mg/dL — ABNORMAL LOW (ref 0.57–1.00)
GFR calc Af Amer: 120 mL/min/{1.73_m2} (ref >59)
GFR calc non Af Amer: 104 mL/min/{1.73_m2} (ref >59)
Glucose: 173 mg/dL — ABNORMAL HIGH (ref 65–99)
Potassium: 4.6 mmol/L (ref 3.5–5.2)
Sodium: 139 mmol/L (ref 134–144)

## 2016-04-01 LAB — VITAMIN D 25 HYDROXY (VIT D DEFICIENCY, FRACTURES): Vit D, 25-Hydroxy: 66.4 ng/mL (ref 30.0–100.0)

## 2016-04-03 ENCOUNTER — Other Ambulatory Visit: Payer: Self-pay | Admitting: Family Medicine

## 2016-04-20 ENCOUNTER — Other Ambulatory Visit: Payer: Self-pay | Admitting: Family Medicine

## 2016-04-23 ENCOUNTER — Other Ambulatory Visit: Payer: Self-pay | Admitting: Family Medicine

## 2016-05-12 ENCOUNTER — Other Ambulatory Visit: Payer: Self-pay | Admitting: Family Medicine

## 2016-06-07 ENCOUNTER — Ambulatory Visit (INDEPENDENT_AMBULATORY_CARE_PROVIDER_SITE_OTHER): Payer: Medicare Other | Admitting: Pharmacist

## 2016-06-07 ENCOUNTER — Other Ambulatory Visit: Payer: Self-pay | Admitting: Family Medicine

## 2016-06-07 VITALS — BP 122/76 | HR 75 | Ht 64.0 in | Wt 179.0 lb

## 2016-06-07 DIAGNOSIS — Z0000 Encounter for general adult medical examination without abnormal findings: Secondary | ICD-10-CM

## 2016-06-07 DIAGNOSIS — Z23 Encounter for immunization: Secondary | ICD-10-CM | POA: Diagnosis not present

## 2016-06-07 MED ORDER — GLIMEPIRIDE 2 MG PO TABS: 2.0000 mg | ORAL_TABLET | Freq: Every day | ORAL | 1 refills | Status: DC

## 2016-06-07 NOTE — Progress Notes (Signed)
Patient ID: [name], female   DOB: 01-May-1945, 71 y.o.   MRN: [id number]    Subjective:   [name] is a 71 y.o. female who presents for an Initial Medicare Annual Wellness Visit and recheck uncontrolled DM.   [name] is accompanied by her husband.  They live in [place], [place] and have 2 grown children.  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   She had a stroke about 3 years ago and has paralysis on her right side and dysphagia.  He husband is her primary caregiver.  HBG readings:  235, 253, 233, 267, 221, 244, 259, 258, 268, 253, 266, 233, 246, 253, 260, 268, 243, 227 The last time I saw [name] was had added Januvia to Metformin.  She did well with Januvia but insurance preferred Tradjenta.  She felt that he [name] was causing drowsiness and fatigue and stopped in November 2017. [name] is currently taking Metformin XR 500mg  2 tablet bid.   Current Medications (verified) Outpatient Encounter Prescriptions as of 06/07/2016  Medication Sig  . acetaminophen (TYLENOL) 500 MG tablet Take 500 mg by mouth every 6 (six) hours as needed (pain).   . Ascorbic Acid (VITAMIN C PO) Take 1 tablet by mouth daily.  . Calcium Carbonate-Vitamin D (CALCIUM-VITAMIN D) 500-200 MG-UNIT per tablet Take 1 tablet by mouth daily.  . cholecalciferol (VITAMIN D) 1000 UNITS tablet Take 2,000 Units by mouth daily.   . citalopram (CELEXA) 20 MG tablet TAKE 1 TABLET(20 MG) BY MOUTH TWICE DAILY  . clobetasol cream (TEMOVATE) 0.05 % Apply to affected areas on body 1-2 times daily as needed. Never to face.  . cyclobenzaprine (FLEXERIL) 5 MG tablet Take 1 tablet (5 mg total) by mouth 3 (three) times daily as needed for muscle spasms.  Marland Kitchen dicyclomine (BENTYL) 10 MG capsule TAKE 1 CAPSULE BY MOUTH FOUR TIMES DAILY BEFORE MEALS AND AT BEDTIME  . fluticasone (FLONASE) 50 MCG/ACT nasal spray SHAKE LIQUID AND USE 2 SPRAYS IN EACH NOSTRIL DAILY  . folic acid (FOLVITE) 1  MG tablet TAKE 1 TABLET(1 MG) BY MOUTH DAILY  . levETIRAcetam (KEPPRA) 500 MG tablet TAKE 1 TABLET BY MOUTH TWICE DAILY  . levothyroxine (SYNTHROID, LEVOTHROID) 88 MCG tablet TAKE 1 TABLET BY MOUTH EVERY DAY BEFORE BREAKFAST  . loperamide (IMODIUM) 2 MG capsule Take by mouth as needed for diarrhea or loose stools. Reported on 05/17/2015  . metFORMIN (GLUCOPHAGE XR) 500 MG 24 hr tablet Take 2 tablets (1,000 mg total) by mouth daily with breakfast. (Patient taking differently: Take 1,000 mg by mouth 2 (two) times daily. )  . methotrexate (RHEUMATREX) 2.5 MG tablet Take 4 tablets (10 mg total) by mouth once a week. Caution:Chemotherapy. Protect from light. (Patient taking differently: 7.5 mg. Take 4 tablets (10 mg total) by mouth once a week. Caution:Chemotherapy. Protect from light.)  . metoprolol (LOPRESSOR) 50 MG tablet TAKE 1 TABLET(50 MG) BY MOUTH TWICE DAILY  . miconazole (MICOTIN) 2 % cream Apply 1 application topically 2 (two) times daily.  . mirtazapine (REMERON) 15 MG tablet TAKE 1 TABLET(15 MG) BY MOUTH AT BEDTIME  . Multiple Vitamins-Minerals (EYE VITAMINS PO) Take 2 tablets by mouth daily. AREDS formula  . NYSTATIN powder APPLY TWICE DAILY UNDERNEATH BREASTS  . omeprazole (PRILOSEC) 40 MG capsule TAKE 1 CAPSULE BY MOUTH EVERY DAY  . ONE TOUCH ULTRA TEST test strip TEST BLOOD SUGAR ONCE A DAY AS NEEDED  . potassium chloride SA (  K-DUR,KLOR-CON) 20 MEQ tablet TAKE 2 TABLETS BY MOUTH TWICE DAILY  . Probiotic Product (PROBIOTIC PO) Take 1 capsule by mouth daily.  . rivaroxaban (XARELTO) 20 MG TABS tablet Take 1 tablet (20 mg total) by mouth daily.  . simvastatin (ZOCOR) 40 MG tablet Take 1 tablet (40 mg total) by mouth daily.  . traMADol (ULTRAM) 50 MG tablet Take 1/2 tab QID PRN for severe pain.  . [DISCONTINUED] citalopram (CELEXA) 20 MG tablet TAKE 1 TABLET(20 MG) BY MOUTH TWICE DAILY  . [DISCONTINUED] citalopram (CELEXA) 20 MG tablet TAKE 1 TABLET BY MOUTH TWICE DAILY  . [DISCONTINUED]  glimepiride (AMARYL) 2 MG tablet Take 1 tablet (2 mg total) by mouth daily before breakfast.   No facility-administered encounter medications on file as of 06/07/2016.     Allergies (verified) Latex; Caudal tray [lidocaine-epinephrine]; Ciprofloxacin hcl; Codeine; Diovan [valsartan]; Doxycycline; Esomeprazole magnesium; Levofloxacin; Myrbetriq [mirabegron]; Neomycin-bacitracin zn-polymyx; Penicillins; Sulfa drugs cross reactors; Verapamil; and Vimovo [naproxen-esomeprazole]   History: Past Medical History:  Diagnosis Date  . Anemia   . Bronchial spasms   . Chronic anxiety   . Complication of anesthesia    " I shake real bad "  . Diverticulosis   . Elevated blood sugar   . Esophageal stricture   . Family history of anesthesia complication    Daughter also shakes while waking up  . GERD (gastroesophageal reflux disease)   . H/O hiatal hernia   . H/O scarlet fever   . History of bladder repair surgery   . Hx of vaginal hysterectomy   . Hypertension   . Hypothyroidism   . IBS (irritable bowel syndrome)    vs diarrhea vs abd. fullness   . Impaired glucose tolerance   . Neuromuscular disorder ([place])    periferal neuropathy  . Pacemaker    St. Jude  . Paroxysmal atrial fibrillation ([facility])    a. failed flecainide, tikosyn, amio;  b. 01/2012 s/p RFCA.  . Psoriasis   . Psoriasis   . Rectocele, female   . Stroke ([place])   . Tachy-brady syndrome ([place])    a. 08/19/2008 s/p PPM: SJM 2110 Accent  . Uterine prolapse   . Vaginal prolapse 1998   Past Surgical History:  Procedure Laterality Date  . ATRIAL FIBRILLATION ABLATION  02/06/12   PVI by Dr Rayann Heman  . ATRIAL FIBRILLATION ABLATION  07/04/2012   repeat PVI by Dr Rayann Heman  . ATRIAL FIBRILLATION ABLATION N/A 02/06/2012   Procedure: ATRIAL FIBRILLATION ABLATION;  Surgeon: Thompson Grayer, MD;  Location: [facility] CATH LAB;  Service: Cardiovascular;  Laterality: N/A;  . ATRIAL FIBRILLATION ABLATION N/A 07/04/2012   Procedure: ATRIAL FIBRILLATION  ABLATION;  Surgeon: Thompson Grayer, MD;  Location: [facility] CATH LAB;  Service: Cardiovascular;  Laterality: N/A;  . BLADDER SUSPENSION    . INSERT / REPLACE / REMOVE PACEMAKER     SJM  . LEFT HEART CATHETERIZATION WITH CORONARY ANGIOGRAM N/A 03/24/2013   Procedure: LEFT HEART CATHETERIZATION WITH CORONARY ANGIOGRAM;  Surgeon: Blane Ohara, MD;  Location: [facility] CATH LAB;  Service: Cardiovascular;  Laterality: N/A;  . RECTOCELE REPAIR    . TEE WITHOUT CARDIOVERSION  02/06/2012   Procedure: TRANSESOPHAGEAL ECHOCARDIOGRAM (TEE);  Surgeon: Thayer Headings, MD;  Location: [place];  Service: Cardiovascular;  Laterality: N/A;  . TEE WITHOUT CARDIOVERSION N/A 07/04/2012   Procedure: TRANSESOPHAGEAL ECHOCARDIOGRAM (TEE);  Surgeon: Lelon Perla, MD;  Location: [facility] ENDOSCOPY;  Service: Cardiovascular;  Laterality: N/A;  . TRANSTHORACIC ECHOCARDIOGRAM  2008  . VAGINAL HYSTERECTOMY  prolapse    Family History  Problem Relation Age of Onset  . Diabetes Mother   . Heart disease Mother   . Heart disease Father   . Breast cancer Sister   . Ovarian cancer Sister   . Uterine cancer Paternal Aunt   . Crohn's disease Other     neice  . Diabetes Maternal Grandmother   . Diabetes Sister   . Heart disease Brother   . Heart disease Brother   . Colon cancer Neg Hx   . Stomach cancer Neg Hx    Social History   Occupational History  . ADMINISTRATION [employer]   Social History Main Topics  . Smoking status: Never Smoker  . Smokeless tobacco: Never Used  . Alcohol use No  . Drug use: No  . Sexual activity: Yes    Do you feel safe at home?  Yes Are there smokers in your home (other than you)? No  Dietary issues and exercise activities: Current Exercise Habits: The patient does not participate in regular exercise at present  Current Dietary habits:  Husband reports that her appetite has changed since stroke.  She does not like to eat meat.  The only think she likes to eat are  sweets, pasta.  When stroke was first diagnosed patient did have therapy which included assessment of swallowing.    Objective:    Today's Vitals   06/07/16 1516  BP: 122/76  Pulse: 75  Weight: 179 lb (81.2 kg)  Height: 5\' 4"  (1.626 m)  PainSc: 0-No pain   Body mass index is 30.73 kg/m.  Activities of Daily Living In your present state of health, do you have any difficulty performing the following activities: 06/07/2016  Hearing? N  Vision? Y  Difficulty concentrating or making decisions? N  Walking or climbing stairs? Y  Dressing or bathing? Y  Doing errands, shopping? Y  Preparing Food and eating ? Y  Using the Toilet? N  In the past six months, have you accidently leaked urine? N  Do you have problems with loss of bowel control? N  Managing your Medications? Y  Managing your Finances? Y  Housekeeping or managing your Housekeeping? Y  Some recent data might be hidden     Cardiac Risk Factors include: advanced age (>101men, >32 women);diabetes mellitus;dyslipidemia;obesity (BMI >30kg/m2)  Depression Screen PHQ 2/9 Scores 06/07/2016 03/31/2016 12/23/2015 11/25/2015  PHQ - 2 Score 2 1 0 0  PHQ- 9 Score 6 - - -     Fall Risk Fall Risk  06/07/2016 03/31/2016 12/23/2015 11/25/2015 07/20/2015  Falls in the past year? Yes No No Yes No  Number falls in past yr: 1 - - 1 -  Injury with Fall? No - - No -  Risk for fall due to : History of fall(s);Impaired balance/gait;Impaired mobility - - - -  Follow up Falls prevention discussed - - - -    Cognitive Function: MMSE - Mini Mental State Exam 06/08/2016  Not completed: Unable to complete    Immunizations and Health Maintenance Immunization History  Administered Date(s) Administered  . Influenza Whole 11/21/2011  . Influenza, High Dose Seasonal PF 11/25/2015  . Influenza,inj,Quad PF,36+ Mos 12/16/2012, 12/02/2014  . Influenza,inj,quad, With Preservative 12/04/2013  . Pneumococcal Conjugate-13 12/16/2012  . Pneumococcal  Polysaccharide-23 06/07/2016   Health Maintenance Due  Topic Date Due  . OPHTHALMOLOGY EXAM  05/03/2016    Patient Care Team: Chipper Herb, MD as PCP - General (Family Medicine) Evans Lance, MD (  Cardiology) Thompson Grayer, MD (Cardiology) Sable Feil, MD (Gastroenterology) Michael Boston, MD as Consulting Physician (General Surgery) Rosalin Hawking, MD as Consulting Physician (Neurology)  Indicate any recent Medical Services you may have received from other than [facility] providers in the past year (date may be approximate).    Assessment:    Annual Wellness Visit  Type 2 DM, uncontrolled   Screening Tests Health Maintenance  Topic Date Due  . OPHTHALMOLOGY EXAM  05/03/2016  . INFLUENZA VACCINE  09/20/2016  . HEMOGLOBIN A1C  09/28/2016  . URINE MICROALBUMIN  11/24/2016  . FOOT EXAM  03/31/2017  . MAMMOGRAM  10/17/2017  . TETANUS/TDAP  03/23/2020  . COLONOSCOPY  08/10/2021  . DEXA SCAN  Completed  . Hepatitis C Screening  Completed  . PNA vac Low Risk Adult  Completed        Plan:   During the course of the visit Ilda was educated and counseled about the following appropriate screening and preventive services:   Vaccines to include Pneumoccal, Influenza, Td and Shingles - patient was given Pneumovax 23 today;  Declined Shingrix today  Colorectal cancer screening - UTD  Cardiovascular disease screening - UTD  Last lipid panel showed elevated Tg - changes in diet discussed and also better BG control should help to decrease Tg  BP at goal today  Diabetes - add glimepiride 2mg  = take 1 tablet each morning  Continue to check BG daily  Bone Denisty / Osteoporosis Screening - appears to have been ordered 11/25/2015 but could not find result in chart. Will confirm with x-ray deparptment to see if DEXA was done.   Mammogram - UTD  PAP - UTD  Glaucoma screening / Diabetic Eye Exam - reminded that eye exam is due  Nutrition counseling - gave #4 Glucerna to try -  recommended drink for either meal or snack up to once a day.  Discussed limiting sweets such as icecream.  Discussed soft foods that are low in CHO she can try to incorporate into diet  Advanced Directives - packet given  Physical Activity - gave handout for chair exercises.     Patient Instructions (the written plan) were given to the patient.   Cherre Robins, PharmD   06/08/2016

## 2016-06-07 NOTE — Patient Instructions (Addendum)
  Ms. [name] , Thank you for taking time to come for your Medicare Wellness Visit. I appreciate your ongoing commitment to your health goals. Please review the following plan we discussed and let me know if I can assist you in the future.   These are the goals we discussed: Continue to check blood glucose once or twice a day Glucose goals - 80 to 130 when fasting / in morning                Less than 180 if within 2 hours of eating  Start glimepiride 2mg  - take 1 tablet each morning for blood glucose  Try to increase walking or chair exercises - goal is to start with 10 minutes and work up to 30 minutes if able.    Increase non-starchy vegetables - carrots, green bean, squash, zucchini, tomatoes, onions, peppers, spinach and other green leafy vegetables, cabbage, lettuce, cucumbers, asparagus, okra (not fried), eggplant Limit sugar and processed foods (cakes, cookies, ice cream, crackers and chips) Increase fresh fruit but limit serving sizes 1/2 cup or about the size of tennis or baseball Limit red meat to no more than 1-2 times per week (serving size about the size of your palm) Choose whole grains / lean proteins - whole wheat bread, quinoa, whole grain rice (1/2 cup), fish, chicken, [place] Avoid sugar and calorie containing beverages - soda, sweet tea and juice.  Choose water or unsweetened tea instead.   This is a list of the screening recommended for you and due dates:  Health Maintenance  Topic Date Due  . Pneumonia vaccines (2 of 2 - PPSV23) 12/16/2013  . Eye exam for diabetics  05/03/2016  . Flu Shot  09/20/2016  . Hemoglobin A1C  09/28/2016  . Urine Protein Check  11/24/2016  . Complete foot exam   03/31/2017  . Mammogram  10/17/2017  . Tetanus Vaccine  03/23/2020  . Colon Cancer Screening  08/10/2021  . DEXA scan (bone density measurement)  2019  .  Hepatitis C: One time screening is recommended by Center for Disease Control  (CDC) for  adults born from [birth year] through 1965.    Completed

## 2016-06-08 ENCOUNTER — Encounter: Payer: Self-pay | Admitting: Pharmacist

## 2016-06-13 ENCOUNTER — Other Ambulatory Visit: Payer: Self-pay | Admitting: Family Medicine

## 2016-06-14 ENCOUNTER — Ambulatory Visit: Payer: Self-pay | Admitting: Surgery

## 2016-06-14 DIAGNOSIS — K432 Incisional hernia without obstruction or gangrene: Secondary | ICD-10-CM | POA: Diagnosis not present

## 2016-06-14 DIAGNOSIS — Z01818 Encounter for other preprocedural examination: Secondary | ICD-10-CM | POA: Diagnosis not present

## 2016-06-14 NOTE — H&P (Signed)
[name] 06/14/2016 4:26 PM Location: [facility] Patient #: [id number] DOB: 1945-07-29 Married / Language: Cleophus Molt / Race: White Female  History of Present Illness Adin Hector MD; 06/14/2016 6:07 PM) The patient is a 71 year old female who presents for an evaluation of a hernia. Note for "Hernia": Patient sent by PCP, Dr. Redge Gainer, for concern of abdominal hernia  Pleasant woman comes today with her husband. a history of paroxysmal atrial fibrillation, symptomatic tachycardia bradycardia syndrome, status post permanent pacemaker insertion. She was treated with multiple antiarrhythmic drugs and has over the years had worsening atrial fibrillation. She underwent atrial fib ablation but continued to have some symptomatic atrial fib and undewent convergence ablation procedure at [facility] and suffered a major stroke resulting in left HP and dense expressive aphasia Fully anticoagulated on Xerelto. Has dense right hemiparesis as well as difficulty with speech.   History of prior sternotomy and attempted correction of fibrillation. They noticed a lump above her bellybutton. I saw her a couple times in the past few years to discuss hernia repair. The first time I saw her she had a small asymptomatic reducible hernia. I did not strongly recommend surgery at this time. Patient wished to hold off on any hernia repair. However, the hernia has gotten larger. More strongly recommended hernia surgery last year. Husband confesses he saw commercials about mesh and they were scared about proceeding. However the patient notes that she's had more pain and discomfort. He notes she is more sensitive and it now. It is bothering her more. She wish to reconsider surgery. Primary care physician more strongly recommended that. They return a third time to reconsider hernia repair surgery.   Does have chronic constipation. No severe nausea or vomiting. Had hysterectomy & PEG but no other  intra-abdominal surgeries. She usually has a sling for her right upper extremity and walks with a cane. Her expressive aphasia is a little better and she is stronger. Certainly no new events. She still is followed by cardiology closely, primarily Dr. Lovena Le and Dr. Rayann Heman. She is fully anticoagulated. She's having worsening discomfort in her abdomen.   Problem List/Past Medical Adin Hector, MD; 06/14/2016 4:59 PM) PEG (PERCUTANEOUS ENDOSCOPIC GASTROSTOMY) ADJUSTMENT/REPLACEMENT/REMOVAL (Z43.1) EPIGASTRIC HERNIA (K43.9) INCISIONAL HERNIA, WITHOUT OBSTRUCTION OR GANGRENE (K43.2)  Past Surgical History Adin Hector, MD; 06/14/2016 4:59 PM) Hysterectomy (not due to cancer) - Partial  Diagnostic Studies History Adin Hector, MD; 06/14/2016 4:59 PM) Colonoscopy within last year Mammogram 1-3 years ago Pap Smear 1-5 years ago  Allergies (Janette Ranson, CMA; 06/14/2016 4:26 PM) Allergies Reconciled  Medication History (Janette Ranson, CMA; 06/14/2016 4:32 PM) Imodium A-D (2MG  Tablet, Oral as needed) Active. Rheumatrex (2.5MG  Tablet, Oral) Active. Lopressor HCT (50-25MG  Tablet, Oral daily) Active. Remeron (15MG  Tablet, Oral) Active. Nystatin (100000 UNIT/ML Suspension, Mouth/Throat) Active. Mycolog II (100000-0.1UNIT/GM-% Cream, External) Active. Potassium Chloride ER (20MEQ Tablet ER, Oral daily) Active. Ultram (50MG  Tablet, Oral) Active. Clindamycin HCl (300MG  Capsule, Oral) Active. Vitamin C (100MG  Tablet, Oral) Active. Bismuth Subsalicylate (262MG /15ML Suspension, Oral) Active. Cholecalciferol (1000UNIT Tablet, Oral) Active. Vitamin D (1000UNIT Tablet, Oral) Active. Keppra (500MG  Tablet, Oral) Active. Imodium (2MG  Capsule, Oral) Active. MetFORMIN HCl ER (500MG  Tablet ER 24HR, Oral) Active. Methotrexate (2.5MG  Tablet, Oral) Active. Metoprolol Tartrate (50MG  Tablet, Oral) Active. Citracal/Vitamin D (250-200MG -UNIT Tablet, Oral daily)  Active. CeleXA (20MG  Tablet, Oral daily) Active. Bentyl (10MG  Capsule, Oral daily) Active. Flonase (50MCG/ACT Suspension, Nasal as needed) Active. Levothroid (88MCG Tablet, Oral daily) Active. Omeprazole (40MG  Capsule DR, Oral)  Active. Potassium Chloride (20 MEQ/15ML(10%) Solution, Oral) Active. Probiotic (Oral) Active. Xarelto (20MG  Tablet, Oral d) Active. Zocor (40MG  Tablet, Oral) Active. Glimepiride (2MG  Tablet, Oral) Active. Medications Reconciled  Social History Adin Hector, MD; 06/14/2016 4:59 PM) Caffeine use Carbonated beverages, Coffee, Tea. No alcohol use No drug use Tobacco use Never smoker.  Family History Adin Hector, MD; 06/14/2016 4:59 PM) Alcohol Abuse Brother. Arthritis Father, Mother. Breast Cancer Sister. Depression Brother. Diabetes Mellitus Mother, Sister. Heart Disease Brother, Father, Mother. Heart disease in female family member before age 23 Hypertension Father, Mother. Ovarian Cancer Sister.  Pregnancy / Birth History Adin Hector, MD; 06/14/2016 4:59 PM) Age at menarche 28 years. Age of menopause 39-55 Gravida 2 Maternal age 51-20 Para 2  Other Problems Adin Hector, MD; 06/14/2016 4:59 PM) Anxiety Disorder Arthritis Atrial Fibrillation Back Pain Bladder Problems Cerebrovascular Accident Diabetes Mellitus Diverticulosis Gastroesophageal Reflux Disease General anesthesia - complications High blood pressure Thyroid Disease    Vitals (Janette Ranson CMA; 06/14/2016 4:33 PM) 06/14/2016 4:32 PM Weight: 174 lb Height: 64in Body Surface Area: 1.84 m Body Mass Index: 29.87 kg/m  Pulse: 68 (Regular)  BP: 120/70 (Sitting, Left Arm, Standard)      Physical Exam Adin Hector MD; 06/14/2016 6:17 PM)  General Mental Status-Alert. General Appearance-Not in acute distress, Not Sickly. Orientation-Oriented X3. Hydration-Well  hydrated. Voice-Normal.  Integumentary Global Assessment Upon inspection and palpation of skin surfaces of the - Axillae: non-tender, no inflammation or ulceration, no drainage. and Distribution of scalp and body hair is normal. General Characteristics Temperature - normal warmth is noted.  Head and Neck Head-normocephalic, atraumatic with no lesions or palpable masses. Face Global Assessment - atraumatic, no pallor observed. Neck Global Assessment - no abnormal movements, no bruit auscultated on the right, no bruit auscultated on the left, no decreased range of motion, non-tender. Trachea-midline. Thyroid Gland Characteristics - non-tender.  Eye Eyeball - Left-Extraocular movements intact, No Nystagmus. Eyeball - Right-Extraocular movements intact, No Nystagmus. Cornea - Left-No Hazy. Cornea - Right-No Hazy. Sclera/Conjunctiva - Left-No scleral icterus, No Discharge. Sclera/Conjunctiva - Right-No scleral icterus, No Discharge. Pupil - Left-Direct reaction to light normal. Pupil - Right-Direct reaction to light normal.  ENMT Ears Pinna - Left - no drainage observed, no generalized tenderness observed. Right - no drainage observed, no generalized tenderness observed. Nose and Sinuses External Inspection of the Nose - no destructive lesion observed. Inspection of the nares - Left - quiet respiration. Right - quiet respiration. Mouth and Throat Lips - Upper Lip - no fissures observed, no pallor noted. Lower Lip - no fissures observed, no pallor noted. Nasopharynx - no discharge present. Oral Cavity/Oropharynx - Tongue - no dryness observed. Oral Mucosa - no cyanosis observed. Hypopharynx - no evidence of airway distress observed.  Chest and Lung Exam Inspection Movements - Normal and Symmetrical. Accessory muscles - No use of accessory muscles in breathing. Palpation Palpation of the chest reveals - Non-tender. Auscultation Breath sounds - Normal and  Clear.  Cardiovascular Auscultation Rhythm - Regular. Murmurs & Other Heart Sounds - Auscultation of the heart reveals - No Murmurs and No Systolic Clicks.  Abdomen Inspection Inspection of the abdomen reveals - No Visible peristalsis and No Abnormal pulsations. Umbilicus - No Bleeding, No Urine drainage. Palpation/Percussion Palpation and Percussion of the abdomen reveal - Soft, Non Tender, No Rebound tenderness, No Rigidity (guarding) and No Cutaneous hyperesthesia. Note: Overweight but soft. 7x6 cm supraumbilical mass soft but sensitive. Not redicuible - she does not want me  to try to fully reduce. Scar at former LEFT upper quadrant dimpled PEG site.  Female Genitourinary Sexual Maturity Tanner 5 - Adult hair pattern. Note: No vaginal bleeding nor discharge. No inguinal hernias  Peripheral Vascular Upper Extremity Inspection - Left - No Cyanotic nailbeds, Not Ischemic. Right - No Cyanotic nailbeds, Not Ischemic.  Neurologic Neurologic evaluation reveals -normal attention span and ability to concentrate, able to name objects and repeat phrases. Appropriate fund of knowledge , normal sensation and normal coordination. Mental Status Affect - not angry, not paranoid. Cranial Nerves-Normal Bilaterally. Gait-Normal. Note: Comprehends well but has difficulty finding words to speak. Rather dense RIGHT hemiparesis. RIGHT upper extremity in sling. Can passively extend at the elbow. Moves LEFT upper extremity relatively well. Can slowly transfer up to the bed. A little anxious about heights.  Neuropsychiatric Mental status exam performed with findings of-thought content normal with ability to perform basic computations and apply abstract reasoning and no evidence of hallucinations, delusions, obsessions or homicidal/suicidal ideation. Speech-slow and stuttering. Note: Anxious but consolable  Musculoskeletal Global Assessment Spine, Ribs and Pelvis - no instability,  subluxation or laxity. Right Upper Extremity - no instability, subluxation or laxity.  Lymphatic Head & Neck  General Head & Neck Lymphatics: Bilateral - Description - No Localized lymphadenopathy. Axillary  General Axillary Region: Bilateral - Description - No Localized lymphadenopathy. Femoral & Inguinal  Generalized Femoral & Inguinal Lymphatics: Left - Description - No Localized lymphadenopathy. Right - Description - No Localized lymphadenopathy.    Assessment & Plan Adin Hector MD; 06/14/2016 6:20 PM)  INCISIONAL HERNIA, WITHOUT OBSTRUCTION OR GANGRENE (K43.2) Impression: Larger more sensitive and fully incarcerated supraumbilical incisional hernia.  Because it is larger, irreducible, and more sensitive; I'm still inclined to repair it. Laparoscopic underlay repair with mesh. I would use mesh given her chronic constipation, obesity, and straining. Patient's husband less concerned about using mesh now.  With her chronic anticoagulation for her embolic stroke and neurological issues, her operative risks are increased. Her primary care physician feels comfortable proceeding with surgery. Would like cardiology to make sure they feel it is safe to come off the [facility] perioperatively. Usually wait 2 days preop and then resume postop day 1. If they have hard contraindications, hold off on surgery. However is a little worse each time I see her, and I'm worried delaying further will make it more complicated.  Current Plans You are being scheduled for surgery- Our schedulers will call you.  You should hear from our office's scheduling department within 5 working days about the location, date, and time of surgery. We try to make accommodations for patient's preferences in scheduling surgery, but sometimes the OR schedule or the surgeon's schedule prevents [place] from making those accommodations.  If you have not heard from our office [phone]) in 5 working days, call the office and  ask for your surgeon's nurse.  If you have other questions about your diagnosis, plan, or surgery, call the office and ask for your surgeon's nurse.  Pt Education - CCS Pain control - tylenol only: discussed with patient and provided information. I recommended obtaining preoperative cardiac clearance for recommendations on management of anticoagualtion perioperatively:  1. Timing of holding anticoagulation 2. Need for any bridge therapy (SQ enoxaparin, IV heparin, IV Aggrastat, etc) preop/postop. 3. Desired timing of resumption of anticoagulation.  In general from Dr. Johney Maine' standpoint:  Aspirin is okay to continue perioperatively (81mg  or 325mg ) & does not need to be held  Hold warfarin 5 dayspreoperatively. Consider PT/INR  level on arrival to short stay the day of surgery. No need to check PT/INR level on preop visit  Hold P2Y12 inibitors such as clopidrogel (Plavix) 4 dayspreoperatively  Hold direct thrombin / factor Xa inhibitors (Xerelto, Pradaxa, Eliquis, etc) 2 dayspreoperatively   Request clearance by cardiology to better assess operative risk & see if a reevaluation, further workup, etc is needed. Also recommendations on how medications such as for anticoagulation and blood pressure should be managed/held/restarted after surgery.  PREOP - [place] - ENCOUNTER FOR PREOPERATIVE EXAMINATION FOR GENERAL SURGICAL PROCEDURE (Z01.818)  Current Plans You are being scheduled for surgery- Our schedulers will call you.  You should hear from our office's scheduling department within 5 working days about the location, date, and time of surgery. We try to make accommodations for patient's preferences in scheduling surgery, but sometimes the OR schedule or the surgeon's schedule prevents [place] from making those accommodations.  If you have not heard from our office [phone]) in 5 working days, call the office and ask for your surgeon's nurse.  If you have other  questions about your diagnosis, plan, or surgery, call the office and ask for your surgeon's nurse.  Pt Education - Pamphlet Given - Laparoscopic Hernia Repair: discussed with patient and provided information. Written instructions provided The anatomy & physiology of the abdominal wall was discussed. The pathophysiology of hernias was discussed. Natural history risks without surgery including progeressive enlargement, pain, incarceration, & strangulation was discussed. Contributors to complications such as smoking, obesity, diabetes, prior surgery, etc were discussed.  I feel the risks of no intervention will lead to serious problems that outweigh the operative risks; therefore, I recommended surgery to reduce and repair the hernia. I explained laparoscopic techniques with possible need for an open approach. I noted the probable use of mesh to patch and/or buttress the hernia repair  Risks such as bleeding, infection, abscess, need for further treatment, heart attack, death, and other risks were discussed. I noted a good likelihood this will help address the problem. Goals of post-operative recovery were discussed as well. Possibility that this will not correct all symptoms was explained. I stressed the importance of low-impact activity, aggressive pain control, avoiding constipation, & not pushing through pain to minimize risk of post-operative chronic pain or injury. Possibility of reherniation especially with smoking, obesity, diabetes, immunosuppression, and other health conditions was discussed. We will work to minimize complications.  An educational handout further explaining the pathology & treatment options was given as well. Questions were answered. The patient expresses understanding & wishes to proceed with surgery.  Pt Education - CCS Hernia Post-Op HCI (Perri Lamagna): discussed with patient and provided information.  Adin Hector, M.D., F.A.C.S. Gastrointestinal and Minimally  Invasive Surgery [facility] [address], [place], [place] [address] [phone] Main / Paging

## 2016-06-28 ENCOUNTER — Encounter: Payer: Self-pay | Admitting: *Deleted

## 2016-06-28 NOTE — Progress Notes (Signed)
Cardiac clearance form from [facility] signed by Dr. Lovena Le and placed in nurse fax bin in medical records.

## 2016-07-01 ENCOUNTER — Other Ambulatory Visit: Payer: Self-pay | Admitting: Family Medicine

## 2016-07-13 ENCOUNTER — Telehealth: Payer: Self-pay | Admitting: Family Medicine

## 2016-07-14 NOTE — Telephone Encounter (Signed)
[name] called and states that patient has been having bilateral eye pain. States that pt told her that [name] recommenced a place in [place] but did not remember the name of it. They are wanting more information about it.

## 2016-07-14 NOTE — Telephone Encounter (Signed)
Lattie Haw aware - Dr Renford Dills or Dr Normajean Baxter - with [facility]

## 2016-07-19 ENCOUNTER — Ambulatory Visit (INDEPENDENT_AMBULATORY_CARE_PROVIDER_SITE_OTHER): Payer: Medicare Other | Admitting: Neurology

## 2016-07-19 ENCOUNTER — Encounter: Payer: Self-pay | Admitting: Neurology

## 2016-07-19 VITALS — BP 136/79 | HR 80 | Ht 64.0 in | Wt 177.4 lb

## 2016-07-19 DIAGNOSIS — F0631 Mood disorder due to known physiological condition with depressive features: Secondary | ICD-10-CM | POA: Diagnosis not present

## 2016-07-19 DIAGNOSIS — I69398 Other sequelae of cerebral infarction: Secondary | ICD-10-CM

## 2016-07-19 DIAGNOSIS — H538 Other visual disturbances: Secondary | ICD-10-CM

## 2016-07-19 DIAGNOSIS — I6349 Cerebral infarction due to embolism of other cerebral artery: Secondary | ICD-10-CM

## 2016-07-19 DIAGNOSIS — R569 Unspecified convulsions: Secondary | ICD-10-CM

## 2016-07-19 MED ORDER — LEVETIRACETAM 500 MG PO TABS
500.0000 mg | ORAL_TABLET | Freq: Two times a day (BID) | ORAL | 7 refills | Status: DC
Start: 2016-07-19 — End: 2017-07-19

## 2016-07-19 NOTE — Progress Notes (Signed)
[id number]    Provider:  Dr Jaynee Eagles Referring Provider: Chipper Herb, MD Primary Care Physician:  Chipper Herb, MD   CC: Stroke follow up  Interval update 07/19/2016: She has macular degeneration and vision changes since the stroke and they saw a physician and tried glasses. In the last year vision is worsening. She can't see as well. Last year she couldn't pass the eye exam. Her glasses did not help. She sees blurry all the time not worse in the morning or worse at night, slowly worsening over the last year, no pain on eye movement, continuous. No ptosis. No other new weakness. Just blurry vision. If she closes one eye the It is the left eye that is blurry and worsening. Her macular degeneration is worsening. The blurriness does not improve with closing one eye. Recommend an MRi of the braina nd further workup but they want to see ophthalmology first and let me know how they want to proceed. No seizures. She continues to be depressed and less social, but she denies significant changes in mood from last year. She had depression even beofre the stroke, she is a home body and husband wants to get out. She declines any more PT. Her speech is improving.   Interval update 07/20/2015; Speech is much better. Everything else is the same. Speech therapist concentrated on her muscles in her throat and she still has a difficult time swallowing but not choking on food. Husband says he wants to see her smile more. Her mood is improved. Walking is good. Right arm improve din strength, 3/5 and has some grip. She has PLMS. Discussed driving at length, she should have a formal motor vehicle evaluation before driving on the road.  Interval update 01/18/2015: Discussed recent eeg that was abnormal. She is on the keppra and appears to be well controlled. She continues to struggle with depression. I highly recommend therapy and psychiatry. Spoke with them at length about this.  IMPRESSION:   Abnormal EEG in awake state demonstrating: 1. Frequent left fronto-temporal sharp waves noted over F7 and T3 electrodes. 2. Mild left hemispheric slowing in the 6-7 Hz range. 3. No electrographic seizures are seen.  Interval update 10/19/2014:  Discussed records from [place] at length, I reviewed over 700 pages of records in total of patient's inpatient, acute rehab and subacute rehab stays. Patient's husband had a lot of questions about her seizures, EEGs and subsequent treatment with antiepileptic drugs after her stroke. Discussed this in detail with him please see addendum which does show that patient had episodes of alteration and confusion, epileptiform activity and seizures confirmed on EEG. She is not having episodes of confusion since then. No episodes of staring, non-purposeful movements or staring episodes or anything concerning for ongoing partial seizures. However she says that she can't remember coming down here in May. Her long term memory is good. She can remember things that she did a long time ago. She can't remember things that happened yesterday, doesn't remember people that they talk to since she had the stroke. All these symptoms have started since the stroke. She isn't very social. She does have a long history of mood disorder. She does want to see her family and is interested in seeing her grandkids. She is not very interested in other things. She is not positive, she is frequently sad. She doesn't laugh very often. Discussed that considering her large stroke, she is at high risk for continued seizures and I recommend continued Rep she  is not having side effects. We could repeat EEG and try weaning her off the [place] if they feel strongly about trying to discontinue medication. However I suggest follow up with Dr. Erlinda Hong who is a stroke specialist. Two sleep studies were negative.    Initial visit 07/15/2014: [name] is a 71 y.o. female here as a referral from Dr. Hassell Done for  stroke. She has a past medical history of hypertension, diabetes, high cholesterol, atrial fibrillation, depression, anxiety, hypothyroidism, peripheral neuropathy. She sees Dr. Rayann Heman as her cardiologist and family speaks very highly of him, for paroxysmal A. Fib.. She is here with her husband and son who provide most of the information. Husband is very concerned as he feels patient's memory is affected, her mood is poor, he is unsure why she was treated for seizures in the past and whether she even has seizures. Stroke was in February 20 15th. Patient suffered a stroke during ablation for atrial fibrillation. She was taken to [facility] and was admitted for stroke. She was in rehab at [facility] after the stroke. Then she went to [facility] for rehabilitation and was diagnosed with seizures. She was seeing a neurologist in [place] and had multiple EEGs and husband is unsure if she was being treated appropriately. She has residual right-sided weakness and aphasia. Per husband and son, she has physically significantly improved since her stroke. She is in physical therapy, occupational therapy as well as speech therapy.   However the biggest concern is her mood. They feel she has depression. When patient is asked if she is depressed she nods yes. Some weeks are better than others but she has lost interest in doing activities that she liked, she appears very fatigued, disinterest in activities, sad most days. She gets frustrated with phones, she won't use a computer, she gets flustered. Her mood is variable, sometimes she is happier than other times. Motivation fluctuates. She doesn't want to go out of the house. Son's wife spends time with patient and is worried about patient's outlook.. Family Questions on whether or not this is associated with stroke. They're very concerned and would like to help patient. She has lost weight and has decreased  appetite.   Review of Systems: Patient complains of symptoms per HPI as well as the following symptoms: Fatigue, and expected weight change, eye itching, light sensitivity, blurred vision. Pertinent negatives per HPI. All others negative..  Social History   Social History  . Marital status: Married    Spouse name: [name]"  . Number of children: 2  . Years of education: 12+   Occupational History  . Retired Architectural technologist.   Social History Main Topics  . Smoking status: Never Smoker  . Smokeless tobacco: Never Used  . Alcohol use No  . Drug use: No  . Sexual activity: Yes   Other Topics Concern  . Not on file   Social History Narrative   Lives in [place].      Left-handed   Caffeine use: drinks coffee/tea: 2 cups coffee in the morning, drinks 1 glass tea per day        Family History  Problem Relation Age of Onset  . Diabetes Mother   . Heart disease Mother   . Heart disease Father   . Breast cancer Sister   . Ovarian cancer Sister   . Uterine cancer Paternal Aunt   . Crohn's disease Other        neice  .  Diabetes Maternal Grandmother   . Diabetes Sister   . Heart disease Brother   . Heart disease Brother   . Colon cancer Neg Hx   . Stomach cancer Neg Hx     Past Medical History:  Diagnosis Date  . Anemia   . Bronchial spasms   . Chronic anxiety   . Complication of anesthesia    " I shake real bad "  . Diverticulosis   . Elevated blood sugar   . Esophageal stricture   . Family history of anesthesia complication    Daughter also shakes while waking up  . GERD (gastroesophageal reflux disease)   . H/O hiatal hernia   . H/O scarlet fever   . History of bladder repair surgery   . Hx of vaginal hysterectomy   . Hypertension   . Hypothyroidism   . IBS (irritable bowel syndrome)    vs diarrhea vs abd. fullness   . Impaired glucose tolerance   . Neuromuscular disorder ([place])    periferal neuropathy  . Pacemaker    St. Jude  .  Paroxysmal atrial fibrillation (H[facility])    a. failed flecainide, tikosyn, amio;  b. 01/2012 s/p RFCA.  . Psoriasis   . Psoriasis   . Rectocele, female   . Stroke ([place])   . Tachy-brady syndrome ([place])    a. 08/19/2008 s/p PPM: SJM 2110 Accent  . Uterine prolapse   . Vaginal prolapse 1998    Past Surgical History:  Procedure Laterality Date  . ATRIAL FIBRILLATION ABLATION  02/06/12   PVI by Dr Rayann Heman  . ATRIAL FIBRILLATION ABLATION  07/04/2012   repeat PVI by Dr Rayann Heman  . ATRIAL FIBRILLATION ABLATION N/A 02/06/2012   Procedure: ATRIAL FIBRILLATION ABLATION;  Surgeon: Thompson Grayer, MD;  Location[facility] CATH LAB;  Service: Cardiovascular;  Laterality: N/A;  . ATRIAL FIBRILLATION ABLATION N/A 07/04/2012   Procedure: ATRIAL FIBRILLATION ABLATION;  Surgeon: Thompson Grayer, MD;  Location: [facility] CATH LAB;  Service: Cardiovascular;  Laterality: N/A;  . BLADDER SUSPENSION    . INSERT / REPLACE / REMOVE PACEMAKER     SJM  . LEFT HEART CATHETERIZATION WITH CORONARY ANGIOGRAM N/A 03/24/2013   Procedure: LEFT HEART CATHETERIZATION WITH CORONARY ANGIOGRAM;  Surgeon: Blane Ohara, MD;  Location: [facility] CATH LAB;  Service: Cardiovascular;  Laterality: N/A;  . RECTOCELE REPAIR    . TEE WITHOUT CARDIOVERSION  02/06/2012   Procedure: TRANSESOPHAGEAL ECHOCARDIOGRAM (TEE);  Surgeon: Thayer Headings, MD;  Location: [place];  Service: Cardiovascular;  Laterality: N/A;  . TEE WITHOUT CARDIOVERSION N/A 07/04/2012   Procedure: TRANSESOPHAGEAL ECHOCARDIOGRAM (TEE);  Surgeon: Lelon Perla, MD;  [facility] ENDOSCOPY;  Service: Cardiovascular;  Laterality: N/A;  . TRANSTHORACIC ECHOCARDIOGRAM  2008  . VAGINAL HYSTERECTOMY     prolapse     Current Outpatient Prescriptions  Medication Sig Dispense Refill  . acetaminophen (TYLENOL) 500 MG tablet Take 500 mg by mouth every 6 (six) hours as needed (pain).     . Ascorbic Acid (VITAMIN C PO) Take 1 tablet by mouth daily.    . Calcium Carbonate-Vitamin D (CALCIUM-VITAMIN  D) 500-200 MG-UNIT per tablet Take 1 tablet by mouth daily.    . cholecalciferol (VITAMIN D) 1000 UNITS tablet Take 2,000 Units by mouth daily.     . citalopram (CELEXA) 20 MG tablet TAKE 1 TABLET(20 MG) BY MOUTH TWICE DAILY 60 tablet 0  . clobetasol cream (TEMOVATE) 0.05 % Apply to affected areas on body 1-2 times daily as needed. Never to  face.    . cyclobenzaprine (FLEXERIL) 5 MG tablet Take 1 tablet (5 mg total) by mouth 3 (three) times daily as needed for muscle spasms. 30 tablet 0  . dicyclomine (BENTYL) 10 MG capsule TAKE 1 CAPSULE BY MOUTH FOUR TIMES DAILY BEFORE MEALS AND AT BEDTIME 120 capsule 3  . fluticasone (FLONASE) 50 MCG/ACT nasal spray SHAKE LIQUID AND USE 2 SPRAYS IN EACH NOSTRIL DAILY 48 g 1  . folic acid (FOLVITE) 1 MG tablet TAKE 1 TABLET(1 MG) BY MOUTH DAILY 90 tablet 0  . glimepiride (AMARYL) 2 MG tablet TAKE 1 TABLET(2 MG) BY MOUTH DAILY BEFORE BREAKFAST 90 tablet 0  . levETIRAcetam (KEPPRA) 500 MG tablet TAKE 1 TABLET BY MOUTH TWICE DAILY 180 tablet 7  . levothyroxine (SYNTHROID, LEVOTHROID) 88 MCG tablet TAKE 1 TABLET BY MOUTH EVERY DAY BEFORE BREAKFAST 90 tablet 10  . loperamide (IMODIUM) 2 MG capsule Take by mouth as needed for diarrhea or loose stools. Reported on 05/17/2015    . metFORMIN (GLUCOPHAGE XR) 500 MG 24 hr tablet Take 2 tablets (1,000 mg total) by mouth daily with breakfast. (Patient taking differently: Take 1,000 mg by mouth 2 (two) times daily. ) 180 tablet 1  . methotrexate (RHEUMATREX) 2.5 MG tablet Take 4 tablets (10 mg total) by mouth once a week. Caution:Chemotherapy. Protect from light. (Patient taking differently: 7.5 mg. Take 4 tablets (10 mg total) by mouth once a week. Caution:Chemotherapy. Protect from light.) 16 tablet 1  . metoprolol (LOPRESSOR) 50 MG tablet TAKE 1 TABLET(50 MG) BY MOUTH TWICE DAILY 180 tablet 1  . miconazole (MICOTIN) 2 % cream Apply 1 application topically 2 (two) times daily. 28.35 g 0  . mirtazapine (REMERON) 15 MG tablet  TAKE 1 TABLET(15 MG) BY MOUTH AT BEDTIME 90 tablet 0  . Multiple Vitamins-Minerals (EYE VITAMINS PO) Take 2 tablets by mouth daily. AREDS formula    . NYSTATIN powder APPLY TWICE DAILY UNDERNEATH BREASTS 60 g 0  . omeprazole (PRILOSEC) 40 MG capsule TAKE 1 CAPSULE BY MOUTH EVERY DAY 90 capsule 4  . ONE TOUCH ULTRA TEST test strip TEST BLOOD SUGAR ONCE A DAY 100 each 0  . potassium chloride SA (K-DUR,KLOR-CON) 20 MEQ tablet TAKE 2 TABLETS BY MOUTH TWICE DAILY 120 tablet 02  . Probiotic Product (PROBIOTIC PO) Take 1 capsule by mouth daily.    . rivaroxaban (XARELTO) 20 MG TABS tablet Take 1 tablet (20 mg total) by mouth daily. 30 tablet 5  . simvastatin (ZOCOR) 40 MG tablet Take 1 tablet (40 mg total) by mouth daily. 30 tablet 5  . simvastatin (ZOCOR) 40 MG tablet TAKE 1 TABLET BY MOUTH EVERY DAY 30 tablet 3  . traMADol (ULTRAM) 50 MG tablet Take 1/2 tab QID PRN for severe pain. 28 tablet 0   No current facility-administered medications for this visit.     Allergies as of 07/19/2016 - Review Complete 06/07/2016  Allergen Reaction Noted  . Latex Rash 06/27/2012  . Caudal tray [lidocaine-epinephrine]  06/22/2010  . Ciprofloxacin hcl Nausea Only 06/03/2012  . Codeine Nausea Only   . Diovan [valsartan] Itching 06/22/2010  . Doxycycline Other (See Comments)   . Esomeprazole magnesium  06/22/2010  . Levofloxacin  06/22/2010  . Myrbetriq [mirabegron] Diarrhea 12/01/2013  . Neomycin-bacitracin zn-polymyx    . Penicillins    . Sulfa drugs cross reactors Other (See Comments) 06/22/2010  . Verapamil  06/22/2010  . Vimovo [naproxen-esomeprazole]  06/22/2010    Vitals: BP 136/79   Pulse 80  Last Weight:  Wt Readings from Last 1 Encounters:  06/07/16 179 lb (81.2 kg)   Last Height:   Ht Readings from Last 1 Encounters:  06/07/16 5\' 4"  (1.626 m)    Speech:  Expressive aphasia, comprehension appears to be intact Cognition:  The patient is oriented to person, date, day, month,  year Cranial Nerves:  physiologic anisocoria left pupil 40mm right 85mm but both round, and equally reactive to light. Visual fields are full to finger confrontation. Extraocular movements are intact. Trigeminal sensation is intact and the muscles of mastication are normal. Right lower facial droop. The palate elevates in the midline. Hearing intact. Voice is normal. Shoulder shrug is normal. The tongue has normal motion without fasciculations.   Coordination:  No dysmetria on the left, cannot perform on the right due to weakness  Gait:  With walker  Motor Observation:  No asymmetry, no atrophy, and no involuntary movements noted. Tone:  Normal muscle tone.   Posture:  Posture is normal. normal erect   Strength: right arm and hip flexion 4/5 and right DF 4+/5  Otherwise strength is V/V in the left upper and left lower limbs.    Sensation: intact to LT, there does not appear to be hemisensory loss, patient reports equal sensation bilaterally to touch. However difficult exam as patient is mostly nonverbal.. However I would expect hemisensory loss based on size and location of CVA.   Reflex Exam:   Toes:  Right upgoing  Clonus:  Clonus is absent.   Assessment/Plan: 71 year old patient who presents with her husband here for follow up of stroke with residual right-sided hemiparesis and aphasia. By account she appears to be doing very well after her stroke as far as physical therapy, occupational therapy and speech therapy are concern. However patient appears to have significant depression which is concerning for husband and son, discussed depression at length with family, this is very common after stroke. Patient is on Celexa and follows closely with Dr. Redge Gainer. They have been evaluated by primary care to ensure there are no medical causes for patient's fatigue and depression including thyroid testing, ensuring there is no anemia, or any infection  or any other metabolic, toxic etiology. Highly recommended following up with therapy and psychiatry. The family appears very earnest in their concern and caring for patient, the effects of stroke on a family can be devastating. Discussed this at length with family.   I would like patient to continue Keppra. EEG was abnormal, keppra will likely be life long. Continue Xarelto and statin for stroke prevention. Follow closely with primary care for management of vascular risk factors.  Dysarthria: improved Depression: improved New vision changes: likely macular degeneration but if opthalmology has any concerns about diagnosis recommend MRI brain   Addendum 07/20/2014: Received extensive records.  [facility] rehabilitation consult notes: Patient was initially admitted to [facility] for electrophysiology study and radiofrequency ablation in February 2015. Unfortunately postoperatively she was noted to be weak on her right side. She was diagnosed with a left embolic MCA distribution CVA. She was also noted to have significant dysphagia and dysarthria. Resultant right-sided hemiparesis and hemisensory loss with dense expressive aphasia, dysarthria and dysphasia. She was admitted to the inpatient rehabilitation unit in 04/26/2013. Notes state that at that time of admission she had complete plegia 0/5 in the right upper extremity and right lower extremity. She was able to follow commands but was mostly nonverbal. Also reported right-sided hemisensory loss.  Per notes, patient appeared  lethargic and CT of the head and EEG was performed in rehabilitation. CT on approx 04/29/2013 was compared with CT on 04/16/2013 from [facility], showed an acute left MCA infarction with increased density in the proximal left MCA likely reflecting a thrombus but could be artifactual. No acute intracranial hemorrhage. This finding again likely reflects the left MCA thrombus and now subacute left MCA  infarction. EEG on 04/30/2013 showed mild encephalopathy, left cerebral hemisphere structural and/or physiologic brain damage or dysfunction secondary to recent large middle cerebral artery territory infarction, decreased threshold for seizure of bilateral cerebral hemisphere origin with focalization over the middle aspect of the temporal lobes. Right more than left. No clear evidence of active seizure. Overnight oximetry report was performed on 04/30/2013. The patient was on room air and observed to have slept during testing, recorded over 6 hours 32 minutes, the lowest SaO2 recorded was 93%, negative study for oxygen desaturation.  GI Consult Notes from 05/08/2013 discussed watery diarrhea, C. difficile testing was negative. There is no blood or mucus in the stool. At that time she was on tube feeds, diarrhea started after Glucerna. Diarrhea most likely due to tube feed induced diarrhea. Patient was again seen on April 1 as requested for the evaluation of persistent diarrhea. 64 notes state patient had at least 3-4 loose bowel movements daily, C. difficile has been negative, no leukocytosis. Negative workup for infectious etiology. Physician discussed the possible etiologies with patient and her husband. A sigmoidoscopy was recommended to look for pseudomembranous colitis, ulcerative colitis, microscopic or collagenous colitis. Patient was put on Levaquin, metronidazole. Sigmoidoscopy showed mild diverticulosis on the left side, patchy erythema with exudate consistent with colitis in the rectosigmoid area biopsied for treatment, sensitive and spastic colon.   Patient was discharged from rehabilitation on 05/23/2013. Per discharge summary: Patient made fair progression throughout her rehabilitation hospitalization. At that time patient had passed a swallow study and was put on oral diet. Continued speech therapy and SLP was recommended. Discharge summary states that neurology started patient on  Ritalin for decreased attention. Discharge summary states that she was diagnosed with colitis and started on antibiotics. She did undergo a sigmoidoscopy the day prior to her discharge. Paxil was increased several days prior to her discharge due to depression. Patient was transferred from inpatient rehabilitation to skilled nursing.    EEG performed 08/01/2013 patient was sent from her subacute rehabilitation facility. Impression was abnormal EEG with finding indicating left cerebral hemisphere structural and/or physiologic brain damage or dysfunction. Epileptiform activity with epileptogenic potential over the left cerebral hemisphere (phase reversal at T3) with possibility of localization over the left mid temporal lobes.  Notes from neurology and sleep clinic of [facility]: Notes state that this physician first saw patient in rehabilitation on 04/29/2013. Patient was lethargic and she had an abnormal EEG on 04/30/2013. At that time physician started on Keppra and performed an overnight pulse ox (see above for results). No clinical seizures were recorded. She was started on Keppra 250 mg 3 times a day. Repeat EEG was performed on 08/01/2013 (see above for results) EEG on 10/21/2013 was abnormal awake and asleep EEG showing brief electrographic subclinical seizures and interictal discharges arising from the left frontal temporal lobes. EEG 03/10/2014 showed minimal focal epileptiform activity arising from the anterior aspect of the left frontal temporal lobe with no electrographic subclinical seizures. Physician attributed her lethargy and cognitive mentation changes as attributed to seizure activity. Keppra was increased to 500 in the morning and 750  in the evenings in October 2015.  Still pending notes from [facility]: reviewed 561 pages of records. Significant details as relating to her neurologic management was noted below:  Encounter date 02/28/2013:  She had 2 catheter ablation procedures(2013 and 2014) for afib on xarelto and, s/p pacemaker, antiarrhythmic medications. Still with continued afib, fatigue and palpitations. Hx of impaired glucose control, HTN, DM,chronic back pain, bronchospasm, hypothyroidism, IBS, peripheral neuropathy, anxiety, and psoriasis. Discussed combined endocardial and epicardial ablation procedure.   03/24/2013: left heart cath, selective coronary angiography, LV angiography results show normal coronary arteries with nml left ventricular systolic function.  03/03/2013: PMHx paroxysmal afib, sinus node dysfunction and tachybradycardia syndrome s/p pacemaker in 2010. They discussed again combined endocardial and epicardial ablation procedure or as a last resort AV node ablation.  Echo 03/2013: no intracardiac thrombus, nml left ventricular EF, nml right ventricular contractile performance.   Carotid duplex 04/14/2013: Right carotid: Evidence in the ICA of a less than 40% stenocis Left carotid: evidence in the ICA of a less than 40% stenosis. Non-hemodynamically significant plaque noted in the cca. Vertebrals: patent with ategrade flow Subclavians: normal flow bilaterally  2/24/105: procedure for transabdominal laporoscopic epicardial and endocardial radiofrequency ablation of the left atrium. Notes state successful hybrid ablation procedure for paroxysmal afib. Notes state that pt begain to wake up from anesthesia and not moving right arm. Right arm no response to pain. Patient non-verbal but responding by nodding, all other extremities moving appropriately. She did start to grimace to pain on the right arm. Also noted to have right facial droop. Neurology was consulted.  Neurology note: noted right facial droop, patient following some simple commands, speech with aphasia, no movement in right arm and leg. NIHSS 25. She was not a tPA candidate. CT head stat, could not have MRI due to pacemaker. Large L MCA stroke,  increased density in the proximal left MCA likely thrombus with resultant right-sided weakness and expressive aphasia with a left gaze preference.  ldl 21, hgba1c 6.2,   Neurology note 2/28: neurologically stable for 3 days and transferred back to the [facility] from the [facility]. Neurologic exam was limited due to hypersomnolence. Slight gaze preference, can cross the midline, right facial droop, decreased tone in the right arm with pronator drift, not moving r arm or leg even to noxious stim. Left side moving spontaneously, upgoing r toe.    EGD with peg tube 04/23/2013.Marland Kitchen  She was discharged to inpatient rehab(see details above) but did not regain any movement to her right side prior to discharge     Sarina Ill, MD  [facility] [address] [place], [place] [address]  Phone [phone] Fax [phone]  A total of 25  minutes was spent face-to-face with this patient. Over half this time was spent on counseling patient on the stroke, depression, seizures, blurry vision diagnosis and different diagnostic and therapeutic options available.

## 2016-07-19 NOTE — Patient Instructions (Signed)
Remember to drink plenty of fluid, eat healthy meals and do not skip any meals. Try to eat protein with a every meal and eat a healthy snack such as fruit or nuts in between meals. Try to keep a regular sleep-wake schedule and try to exercise daily, particularly in the form of walking, 20-30 minutes a day, if you can.   As far as your medications are concerned, I would like to suggest: Continue current medications  I would like to see you back in 1 year, sooner if we need to. Please call us with any interim questions, concerns, problems, updates or refill requests.   Our phone number is [phone]. We also have an after hours call service for urgent matters and there is a physician on-call for urgent questions. For any emergencies you know to call 911 or go to the nearest emergency room   

## 2016-07-28 ENCOUNTER — Encounter: Payer: Self-pay | Admitting: Internal Medicine

## 2016-07-28 ENCOUNTER — Ambulatory Visit (INDEPENDENT_AMBULATORY_CARE_PROVIDER_SITE_OTHER): Payer: Medicare Other | Admitting: Internal Medicine

## 2016-07-28 VITALS — BP 122/68 | HR 80 | Ht 64.0 in | Wt 178.8 lb

## 2016-07-28 DIAGNOSIS — Z950 Presence of cardiac pacemaker: Secondary | ICD-10-CM | POA: Diagnosis not present

## 2016-07-28 DIAGNOSIS — I495 Sick sinus syndrome: Secondary | ICD-10-CM

## 2016-07-28 DIAGNOSIS — I6349 Cerebral infarction due to embolism of other cerebral artery: Secondary | ICD-10-CM

## 2016-07-28 DIAGNOSIS — I480 Paroxysmal atrial fibrillation: Secondary | ICD-10-CM

## 2016-07-28 LAB — CUP PACEART INCLINIC DEVICE CHECK
Battery Remaining Longevity: 92 mo
Battery Voltage: 2.9 V
Brady Statistic RA Percent Paced: 29 %
Brady Statistic RV Percent Paced: 3.4 %
Date Time Interrogation Session: 20180608171843
Implantable Lead Implant Date: 20100630
Implantable Lead Implant Date: 20100630
Implantable Lead Location: 753859
Implantable Lead Location: 753860
Implantable Pulse Generator Implant Date: 20100630
Lead Channel Impedance Value: 437.5 Ohm
Lead Channel Impedance Value: 462.5 Ohm
Lead Channel Pacing Threshold Amplitude: 1 V
Lead Channel Pacing Threshold Amplitude: 1 V
Lead Channel Pacing Threshold Pulse Width: 0.4 ms
Lead Channel Pacing Threshold Pulse Width: 0.8 ms
Lead Channel Sensing Intrinsic Amplitude: 5 mV
Lead Channel Sensing Intrinsic Amplitude: 5.2 mV
Lead Channel Setting Pacing Amplitude: 2 V
Lead Channel Setting Pacing Amplitude: 2.5 V
Lead Channel Setting Pacing Pulse Width: 0.8 ms
Lead Channel Setting Sensing Sensitivity: 1.5 mV
Pulse Gen Model: 2110
Pulse Gen Serial Number: 2285953

## 2016-07-28 NOTE — Patient Instructions (Signed)
Medication Instructions:  Your physician recommends that you continue on your current medications as directed. Please refer to the Current Medication list given to you today.   Labwork: None Ordered   Testing/Procedures: None Ordered   Follow-Up: Your physician wants you to follow-up in: 1 year with Dr. Taylor. You will receive a reminder letter in the mail two months in advance. If you don't receive a letter, please call our office to schedule the follow-up appointment.  Remote monitoring is used to monitor your Pacemaker from home. This monitoring reduces the number of office visits required to check your device to one time per year. It allows us to keep an eye on the functioning of your device to ensure it is working properly. You are scheduled for a device check from home on  10/30/16 . You may send your transmission at any time that day. If you have a wireless device, the transmission will be sent automatically. After your physician reviews your transmission, you will receive a postcard with your next transmission date.    Any Other Special Instructions Will Be Listed Below (If Applicable).     If you need a refill on your cardiac medications before your next appointment, please call your pharmacy.   

## 2016-07-28 NOTE — Progress Notes (Signed)
HPI  [name]  returns today for PM followup. She is a very pleasant 71 year old woman with a history of paroxysmal atrial fibrillation, symptomatic tachycardia bradycardia syndrome, status post permanent pacemaker insertion. She was treated with multiple antiarrhythmic drugs and has over the years had worsening atrial fibrillation. She underwent atrial fib ablation but continued to have some symptomatic atrial fib and undewent convergence ablation procedure at [facility] and suffered a major stroke resulting in right HP and dense expressive aphasia. She has had some trouble with swallowing despite having undergone extensive rehab. Today she c/o poor appetite although she has not lost weight. She has had some swelling in her legs.   Allergies  Allergen Reactions  . Latex Rash  . Caudal Tray [Lidocaine-Epinephrine]     Edema    . Ciprofloxacin Hcl Nausea Only  . Codeine Nausea Only  . Diovan [Valsartan] Itching  . Doxycycline Other (See Comments)    Might have caused heart to race per pt - not sure  . Esomeprazole Magnesium     Headache    . Levofloxacin     Insomnia    . Myrbetriq [Mirabegron] Diarrhea  . Neomycin-Bacitracin Zn-Polymyx     rash  . Penicillins     hives  . Sulfa Drugs Cross Reactors Other (See Comments)    Pt not sure what reaction was  . Verapamil     Edema   . Vimovo [Naproxen-Esomeprazole]     Upset stomach      Current Outpatient Prescriptions  Medication Sig Dispense Refill  . acetaminophen (TYLENOL) 500 MG tablet Take 500 mg by mouth every 6 (six) hours as needed (pain).     . Ascorbic Acid (VITAMIN C PO) Take 1 tablet by mouth daily.    . Calcium Carbonate-Vitamin D (CALCIUM-VITAMIN D) 500-200 MG-UNIT per tablet Take 1 tablet by mouth daily.    . cholecalciferol (VITAMIN D) 1000 UNITS tablet Take 2,000 Units by mouth daily.     . citalopram (CELEXA) 20 MG tablet TAKE 1 TABLET(20 MG) BY MOUTH TWICE DAILY 60 tablet 0  . cyclobenzaprine (FLEXERIL) 5 MG  tablet Take 1 tablet (5 mg total) by mouth 3 (three) times daily as needed for muscle spasms. 30 tablet 0  . dicyclomine (BENTYL) 10 MG capsule TAKE 1 CAPSULE BY MOUTH FOUR TIMES DAILY BEFORE MEALS AND AT BEDTIME 120 capsule 3  . fluticasone (FLONASE) 50 MCG/ACT nasal spray SHAKE LIQUID AND USE 2 SPRAYS IN EACH NOSTRIL DAILY 48 g 1  . folic acid (FOLVITE) 1 MG tablet TAKE 1 TABLET(1 MG) BY MOUTH DAILY 90 tablet 0  . glimepiride (AMARYL) 2 MG tablet TAKE 1 TABLET(2 MG) BY MOUTH DAILY BEFORE BREAKFAST 90 tablet 0  . levETIRAcetam (KEPPRA) 500 MG tablet Take 1 tablet (500 mg total) by mouth 2 (two) times daily. 180 tablet 7  . levothyroxine (SYNTHROID, LEVOTHROID) 88 MCG tablet TAKE 1 TABLET BY MOUTH EVERY DAY BEFORE BREAKFAST 90 tablet 10  . loperamide (IMODIUM) 2 MG capsule Take by mouth as needed for diarrhea or loose stools (Take as directed). Reported on 05/17/2015    . metFORMIN (GLUCOPHAGE XR) 500 MG 24 hr tablet Take 2 tablets (1,000 mg total) by mouth daily with breakfast. 180 tablet 1  . methotrexate (RHEUMATREX) 2.5 MG tablet Take 4 tablets (10 mg total) by mouth once a week. Caution:Chemotherapy. Protect from light. 16 tablet 1  . metoprolol (LOPRESSOR) 50 MG tablet TAKE 1 TABLET(50 MG) BY MOUTH TWICE DAILY 180 tablet 1  .  mirtazapine (REMERON) 15 MG tablet TAKE 1 TABLET(15 MG) BY MOUTH AT BEDTIME 90 tablet 0  . Multiple Vitamins-Minerals (EYE VITAMINS PO) Take 2 tablets by mouth daily. AREDS formula    . NYSTATIN powder APPLY TWICE DAILY UNDERNEATH BREASTS 60 g 0  . omeprazole (PRILOSEC) 40 MG capsule TAKE 1 CAPSULE BY MOUTH EVERY DAY 90 capsule 4  . ONE TOUCH ULTRA TEST test strip TEST BLOOD SUGAR ONCE A DAY 100 each 0  . potassium chloride SA (K-DUR,KLOR-CON) 20 MEQ tablet TAKE 2 TABLETS BY MOUTH TWICE DAILY 120 tablet 02  . Probiotic Product (PROBIOTIC PO) Take 1 capsule by mouth daily.    . rivaroxaban (XARELTO) 20 MG TABS tablet Take 1 tablet (20 mg total) by mouth daily. 30 tablet 5   . simvastatin (ZOCOR) 40 MG tablet TAKE 1 TABLET BY MOUTH EVERY DAY 30 tablet 3  . traMADol (ULTRAM) 50 MG tablet Take 1/2 tab by mouth four times daily as needed for severe pain     No current facility-administered medications for this visit.      Past Medical History:  Diagnosis Date  . Anemia   . Bronchial spasms   . Chronic anxiety   . Complication of anesthesia    " I shake real bad "  . Diverticulosis   . Elevated blood sugar   . Esophageal stricture   . Family history of anesthesia complication    Daughter also shakes while waking up  . GERD (gastroesophageal reflux disease)   . H/O hiatal hernia   . H/O scarlet fever   . History of bladder repair surgery   . Hx of vaginal hysterectomy   . Hypertension   . Hypothyroidism   . IBS (irritable bowel syndrome)    vs diarrhea vs abd. fullness   . Impaired glucose tolerance   . Neuromuscular disorder ([place])    periferal neuropathy  . Pacemaker    St. Jude  . Paroxysmal atrial fibrillation (HCC)    a. failed flecainide, tikosyn, amio;  b. 01/2012 s/p RFCA.  . Psoriasis   . Psoriasis   . Rectocele, female   . Stroke ([place])   . Tachy-brady syndrome ([place])    a. 08/19/2008 s/p PPM: SJM 2110 Accent  . Uterine prolapse   . Vaginal prolapse 1998    ROS:   All systems reviewed and negative except as noted in the HPI.   Past Surgical History:  Procedure Laterality Date  . ATRIAL FIBRILLATION ABLATION  02/06/12   PVI by Dr Rayann Heman  . ATRIAL FIBRILLATION ABLATION  07/04/2012   repeat PVI by Dr Rayann Heman  . ATRIAL FIBRILLATION ABLATION N/A 02/06/2012   Procedure: ATRIAL FIBRILLATION ABLATION;  Surgeon: Thompson Grayer, MD;  Location: [facility] CATH LAB;  Service: Cardiovascular;  Laterality: N/A;  . ATRIAL FIBRILLATION ABLATION N/A 07/04/2012   Procedure: ATRIAL FIBRILLATION ABLATION;  Surgeon: Thompson Grayer, MD;  Location: [facility] CATH LAB;  Service: Cardiovascular;  Laterality: N/A;  . BLADDER SUSPENSION    . INSERT / REPLACE / REMOVE  PACEMAKER     SJM  . LEFT HEART CATHETERIZATION WITH CORONARY ANGIOGRAM N/A 03/24/2013   Procedure: LEFT HEART CATHETERIZATION WITH CORONARY ANGIOGRAM;  Surgeon: Blane Ohara, MD;  Location: [facility] CATH LAB;  Service: Cardiovascular;  Laterality: N/A;  . RECTOCELE REPAIR    . TEE WITHOUT CARDIOVERSION  02/06/2012   Procedure: TRANSESOPHAGEAL ECHOCARDIOGRAM (TEE);  Surgeon: Thayer Headings, MD;  Location: [facility];  Service: Cardiovascular;  Laterality: N/A;  . TEE WITHOUT  CARDIOVERSION N/A 07/04/2012   Procedure: TRANSESOPHAGEAL ECHOCARDIOGRAM (TEE);  Surgeon: Lelon Perla, MD;  Location: [facility] ENDOSCOPY;  Service: Cardiovascular;  Laterality: N/A;  . TRANSTHORACIC ECHOCARDIOGRAM  2008  . VAGINAL HYSTERECTOMY     prolapse      Family History  Problem Relation Age of Onset  . Diabetes Mother   . Heart disease Mother   . Heart disease Father   . Breast cancer Sister   . Ovarian cancer Sister   . Uterine cancer Paternal Aunt   . Crohn's disease Other        neice  . Diabetes Maternal Grandmother   . Diabetes Sister   . Heart disease Brother   . Heart disease Brother   . Colon cancer Neg Hx   . Stomach cancer Neg Hx      Social History   Social History  . Marital status: Married    Spouse name: [name]"  . Number of children: 2  . Years of education: 12+   Occupational History  . Retired Architectural technologist.   Social History Main Topics  . Smoking status: Never Smoker  . Smokeless tobacco: Never Used  . Alcohol use No  . Drug use: No  . Sexual activity: Yes   Other Topics Concern  . Not on file   Social History Narrative   Lives in [place].      Left-handed   Caffeine use: drinks coffee/tea: 2 cups coffee in the morning, drinks 1 glass tea per day         BP 122/68   Pulse 80   Ht 5\' 4"  (1.626 m)   Wt 178 lb 12.8 oz (81.1 kg)   SpO2 95%   BMI 30.69 kg/m   Physical Exam:  stable appearing middle-aged woman, wearing a left arm sling,  NAD HEENT: Unremarkable Neck:  6 cm JVD, no thyromegally Lungs:  Clear with no wheezes, rales, or rhonchi.  HEART:  Regular rate rhythm, no murmurs, no rubs, no clicks Abd:  soft, positive bowel sounds, no organomegally, no rebound, no guarding Ext:  2 plus pulses, no edema, no cyanosis, no clubbing Skin:  No rashes no nodules Neuro:  Dense right HP and expressive aphasia   DEVICE  Normal device function.  See PaceArt for details.   Assess/Plan:  1. PAF - on device interogation, she is in NSR 96% of the time. Continue her current meds. 2. Symptomatic tachy-brady - she is asymptomatic 3. Coags -she is tolerating her systemic anti-coagulation without complication. 4. PPM - her St. Jude DDD PM is working normally. 5. Preoperative evaluation - she is pending hernia surgery. I have discussed that her cardiac risk is low. She will have a very small clotting risk which we have no way to prevent. She may proceed with surgery later this summer.  Mikle Bosworth.D.

## 2016-08-01 ENCOUNTER — Other Ambulatory Visit: Payer: Self-pay | Admitting: Internal Medicine

## 2016-08-01 ENCOUNTER — Telehealth: Payer: Self-pay

## 2016-08-01 NOTE — Telephone Encounter (Signed)
Received notes from OV at [facility] in which Dr. Owens Shark states, "Pt's vision loss is directly related to her dry macular degeneration ou and does not appear to be neurological or a result of her stroke." Sent to med records for scanning, copy to Dr. Jaynee Eagles for review.

## 2016-08-03 ENCOUNTER — Other Ambulatory Visit: Payer: Self-pay | Admitting: Family Medicine

## 2016-08-04 ENCOUNTER — Other Ambulatory Visit: Payer: Self-pay | Admitting: Family Medicine

## 2016-08-09 ENCOUNTER — Encounter: Payer: Self-pay | Admitting: Family Medicine

## 2016-08-09 ENCOUNTER — Ambulatory Visit (INDEPENDENT_AMBULATORY_CARE_PROVIDER_SITE_OTHER): Payer: Medicare Other | Admitting: Family Medicine

## 2016-08-09 VITALS — BP 143/78 | HR 75 | Temp 97.5°F | Ht 64.0 in | Wt 174.0 lb

## 2016-08-09 DIAGNOSIS — R350 Frequency of micturition: Secondary | ICD-10-CM

## 2016-08-09 DIAGNOSIS — I10 Essential (primary) hypertension: Secondary | ICD-10-CM | POA: Diagnosis not present

## 2016-08-09 DIAGNOSIS — E785 Hyperlipidemia, unspecified: Secondary | ICD-10-CM | POA: Diagnosis not present

## 2016-08-09 DIAGNOSIS — I480 Paroxysmal atrial fibrillation: Secondary | ICD-10-CM | POA: Diagnosis not present

## 2016-08-09 DIAGNOSIS — E034 Atrophy of thyroid (acquired): Secondary | ICD-10-CM

## 2016-08-09 DIAGNOSIS — E119 Type 2 diabetes mellitus without complications: Secondary | ICD-10-CM | POA: Diagnosis not present

## 2016-08-09 DIAGNOSIS — I6930 Unspecified sequelae of cerebral infarction: Secondary | ICD-10-CM | POA: Diagnosis not present

## 2016-08-09 DIAGNOSIS — E559 Vitamin D deficiency, unspecified: Secondary | ICD-10-CM | POA: Diagnosis not present

## 2016-08-09 DIAGNOSIS — I6349 Cerebral infarction due to embolism of other cerebral artery: Secondary | ICD-10-CM

## 2016-08-09 LAB — URINALYSIS, COMPLETE
Bilirubin, UA: NEGATIVE
Nitrite, UA: POSITIVE — AB
Protein, UA: NEGATIVE
RBC, UA: NEGATIVE
Specific Gravity, UA: 1.02 (ref 1.005–1.030)
Urobilinogen, Ur: 0.2 mg/dL (ref 0.2–1.0)
pH, UA: 5.5 (ref 5.0–7.5)

## 2016-08-09 LAB — MICROSCOPIC EXAMINATION: WBC, UA: >30 /hpf — AB (ref 0–?)

## 2016-08-09 LAB — BAYER DCA HB A1C WAIVED: HB A1C (BAYER DCA - WAIVED): 7.9 % — ABNORMAL HIGH (ref <7.0)

## 2016-08-09 NOTE — Patient Instructions (Addendum)
Medicare Annual Wellness Visit  [facility] and the medical providers at [facility] strive to bring you the best medical care.  In doing so we not only want to address your current medical conditions and concerns but also to detect new conditions early and prevent illness, disease and health-related problems.    Medicare offers a yearly Wellness Visit which allows our clinical staff to assess your need for preventative services including immunizations, lifestyle education, counseling to decrease risk of preventable diseases and screening for fall risk and other medical concerns.    This visit is provided free of charge (no copay) for all Medicare recipients. The clinical pharmacists at [facility] have begun to conduct these Wellness Visits which will also include a thorough review of all your medications.    As you primary medical provider recommend that you make an appointment for your Annual Wellness Visit if you have not done so already this year.  You may set up this appointment before you leave today or you may call back ([phone]) and schedule an appointment.  Please make sure when you call that you mention that you are scheduling your Annual Wellness Visit with the clinical pharmacist so that the appointment may be made for the proper length of time.     Continue current medications. Continue good therapeutic lifestyle changes which include good diet and exercise. Fall precautions discussed with patient. If an FOBT was given today- please return it to our front desk. If you are over 56 years old - you may need Prevnar 51 or the adult Pneumonia vaccine.  **Flu shots are available--- please call and schedule a FLU-CLINIC appointment**  After your visit with [place] today you will receive a survey in the mail or online from [employer] regarding your care with [place]. Please take a moment to fill this out. Your feedback is very  important to [place] as you can help [place] better understand your patient needs as well as improve your experience and satisfaction. WE CARE ABOUT YOU!!!  We will talk with the cardiologist to make sure there is no other bridging that can be done as far as protecting the patient from another stroke if we stopped the Xarelto. We will call the patient's husband with this information once it becomes available The patient should try to exercise more and eat less to get the blood sugar under better control We will call with the hemoglobin A1c results as soon as it becomes available We will make sure that we send her surgeon Dr. gross a copy of the blood work that is being done.

## 2016-08-09 NOTE — Progress Notes (Signed)
Subjective:    Patient ID: [name], female    DOB: 04-12-1945, 71 y.o.   MRN: [id number]  HPI Pt here for follow up and management of chronic medical problems which includes diabetes, hypertension, and hyperlipidemia. She is taking medications regularly. The patient also had a cerebrovascular accident secondary to embolism with residual paralysis and weakness. The clinical pharmacist has been working with the patient because of her elevated blood sugar. She does have urinary frequency and does complain of some cough and shortness of breath. She has weak and shaky episodes. She also has some back pain. She does have a hernia and Dr. gross has schedule surgery soon to help take care of this. She still followed by the cardiologist and has been approved for a pacemaker. She will be given an FOBT to return and will also get lab work today. We will also need to check a urinalysis. She did have her eye exam done on May 31 in [place]. She brings in blood pressures for review and all of these back through April are good and usually running in the 120 range over the 60 range. Blood sugars from June have been running about 200 in the morning. The blood pressure readings and blood sugar readings will be scanned into the record. She will get lab work today including a hemoglobin A1c. The patient's last hemoglobin A1c was 8.9 and this was about 4 months ago. Her creatinine actually runs on the low side.   Patient Active Problem List   Diagnosis Date Noted  . Type 2 diabetes mellitus ([place]) 05/17/2015  . Cerebrovascular accident (CVA) due to embolism of left middle cerebral artery ([place]) 11/24/2014  . Focal and partial seizures ([place]) 11/24/2014  . Depression 11/24/2014  . Cerebral infarction due to embolism of other cerebral artery ([place]) 09/03/2013  . Metabolic syndrome 41/74/0814  . High risk medication use 03/13/2013  . Fever, unspecified 06/05/2012  . Generalized abdominal pain 06/05/2012    . PAF (paroxysmal atrial fibrillation) ([place]) 02/07/2012  . Tachy-brady syndrome ([place])   . Impaired glucose tolerance   . Fatigue 12/22/2009  . SNORING 12/22/2009  . OTHER CHRONIC NONALCOHOLIC LIVER DISEASE 48/18/5631  . NONSPEC ELEVATION OF LEVELS OF TRANSAMINASE/LDH 03/09/2009  . PPM-[place] 09/01/2008  . BRADYCARDIA 08/13/2008  . Hypothyroidism 06/09/2008  . HTN (hypertension) 06/09/2008  . Atrial fibrillation ([place]) 06/09/2008  . PREMATURE VENTRICULAR CONTRACTIONS 06/09/2008  . IRRITABLE BOWEL SYNDROME 06/09/2008  . PSORIASIS 06/09/2008  . EDEMA 06/09/2008  . DIVERTICULOSIS, COLON, HX OF 06/09/2008   Outpatient Encounter Prescriptions as of 08/09/2016  Medication Sig  . acetaminophen (TYLENOL) 500 MG tablet Take 500 mg by mouth every 6 (six) hours as needed (pain).   . Ascorbic Acid (VITAMIN C PO) Take 1 tablet by mouth daily.  . Calcium Carbonate-Vitamin D (CALCIUM-VITAMIN D) 500-200 MG-UNIT per tablet Take 1 tablet by mouth daily.  . cholecalciferol (VITAMIN D) 1000 UNITS tablet Take 2,000 Units by mouth daily.   . citalopram (CELEXA) 20 MG tablet TAKE 1 TABLET(20 MG) BY MOUTH TWICE DAILY  . cyclobenzaprine (FLEXERIL) 5 MG tablet Take 1 tablet (5 mg total) by mouth 3 (three) times daily as needed for muscle spasms.  Marland Kitchen dicyclomine (BENTYL) 10 MG capsule TAKE 1 CAPSULE BY MOUTH FOUR TIMES DAILY BEFORE MEALS AND AT BEDTIME  . fluticasone (FLONASE) 50 MCG/ACT nasal spray SHAKE LIQUID AND USE 2 SPRAYS IN EACH NOSTRIL DAILY  . folic acid (FOLVITE) 1 MG tablet TAKE 1 TABLET(1 MG) BY  MOUTH DAILY  . glimepiride (AMARYL) 2 MG tablet TAKE 1 TABLET(2 MG) BY MOUTH DAILY BEFORE BREAKFAST  . levETIRAcetam (KEPPRA) 500 MG tablet Take 1 tablet (500 mg total) by mouth 2 (two) times daily.  Marland Kitchen levothyroxine (SYNTHROID, LEVOTHROID) 88 MCG tablet TAKE 1 TABLET BY MOUTH EVERY DAY BEFORE BREAKFAST  . loperamide (IMODIUM) 2 MG capsule Take by mouth as needed for diarrhea or loose stools (Take as  directed). Reported on 05/17/2015  . metFORMIN (GLUCOPHAGE XR) 500 MG 24 hr tablet Take 2 tablets (1,000 mg total) by mouth daily with breakfast.  . methotrexate (RHEUMATREX) 2.5 MG tablet Take 4 tablets (10 mg total) by mouth once a week. Caution:Chemotherapy. Protect from light.  . metoprolol tartrate (LOPRESSOR) 50 MG tablet TAKE 1 TABLET(50 MG) BY MOUTH TWICE DAILY  . mirtazapine (REMERON) 15 MG tablet TAKE 1 TABLET(15 MG) BY MOUTH AT BEDTIME  . Multiple Vitamins-Minerals (EYE VITAMINS PO) Take 2 tablets by mouth daily. AREDS formula  . NYSTATIN powder APPLY TWICE DAILY UNDERNEATH BREASTS  . omeprazole (PRILOSEC) 40 MG capsule TAKE 1 CAPSULE BY MOUTH EVERY DAY  . ONE TOUCH ULTRA TEST test strip TEST BLOOD SUGAR ONCE A DAY  . potassium chloride SA (K-DUR,KLOR-CON) 20 MEQ tablet TAKE 2 TABLETS BY MOUTH TWICE DAILY  . Probiotic Product (PROBIOTIC PO) Take 1 capsule by mouth daily.  . rivaroxaban (XARELTO) 20 MG TABS tablet Take 1 tablet (20 mg total) by mouth daily.  . simvastatin (ZOCOR) 40 MG tablet TAKE 1 TABLET BY MOUTH EVERY DAY  . traMADol (ULTRAM) 50 MG tablet Take 1/2 tab by mouth four times daily as needed for severe pain  . [DISCONTINUED] glimepiride (AMARYL) 2 MG tablet TAKE 1 TABLET(2 MG) BY MOUTH DAILY BEFORE BREAKFAST   No facility-administered encounter medications on file as of 08/09/2016.       Review of Systems  Constitutional: Negative.   HENT: Negative.   Eyes: Negative.   Respiratory: Positive for cough.   Cardiovascular: Negative.   Gastrointestinal: Negative.   Endocrine: Negative.        Blood sugar elevated // some episodes where she feels weak and shaky   Genitourinary: Positive for frequency.  Musculoskeletal: Negative.   Skin: Negative.   Allergic/Immunologic: Negative.   Neurological: Negative.   Hematological: Negative.   Psychiatric/Behavioral: Negative.        Objective:   Physical Exam  Constitutional: She is oriented to person, place, and  time. She appears well-developed and well-nourished. No distress.  The patient is pleasant and alert and is doing great considering the stroke that she has had. She has right-sided weakness and keeps the right arm in a sling and has diminished use of the left lower extremity. Her husband has been very devoted to taking care of her.  HENT:  Head: Normocephalic and atraumatic.  Right Ear: External ear normal.  Left Ear: External ear normal.  Nose: Nose normal.  Mouth/Throat: Oropharynx is clear and moist.  Eyes: Conjunctivae and EOM are normal. Pupils are equal, round, and reactive to light. Right eye exhibits no discharge. Left eye exhibits no discharge. No scleral icterus.  Neck: Normal range of motion. Neck supple. No thyromegaly present.  No bruits thyromegaly or anterior cervical adenopathy  Cardiovascular: Normal rate, regular rhythm, normal heart sounds and intact distal pulses.   No murmur heard. The heart is regular at 60/m  Pulmonary/Chest: Effort normal and breath sounds normal. No respiratory distress. She has no wheezes. She has no rales.  Clear anteriorly  and posteriorly  Abdominal: Soft. Bowel sounds are normal. She exhibits no mass. There is no tenderness. There is no rebound and no guarding.  Musculoskeletal: She exhibits no edema.  The patient uses a cane for ambulation.  Lymphadenopathy:    She has no cervical adenopathy.  Neurological: She is alert and oriented to person, place, and time. No cranial nerve deficit.  The patient has right sided weakness secondary to the embolic stroke. She is able to walk using a cane. She still has expressive aphasia.  Skin: Skin is warm and dry. No rash noted.  Psychiatric: She has a normal mood and affect. Her behavior is normal. Judgment and thought content normal.  Nursing note and vitals reviewed.  BP (!) 152/81 (BP Location: Left Arm)   Pulse 75   Temp 97.5 F (36.4 C) (Oral)   Ht '5\' 4"'$  (1.626 m)   Wt 174 lb (78.9 kg)   SpO2  94%   BMI 29.87 kg/m         Assessment & Plan:  1. Type 2 diabetes mellitus without complication, without long-term current use of insulin (HCC) -Continue current treatment and aggressive therapeutic lifestyle changes including diet and exercise - BMP8+EGFR - CBC with Differential/Platelet - Bayer DCA Hb A1c Waived  2. Hyperlipidemia, unspecified hyperlipidemia type -Continue current treatment pending results of lab work - CBC with Differential/Platelet - Lipid panel  3. Essential hypertension -The blood pressure was elevated on the initial reading today. - BMP8+EGFR - CBC with Differential/Platelet - Hepatic function panel  4. Vitamin D deficiency -Continue current treatment pending results of lab work the heart had a regular rate and rhythm today at 60/m - CBC with Differential/Platelet - VITAMIN D 25 Hydroxy (Vit-D Deficiency, Fractures)  5. PAF (paroxysmal atrial fibrillation) ([place]) -Follow-up with cardiology as planned - CBC with Differential/Platelet  6. Hypothyroidism due to acquired atrophy of thyroid -continue current treatment pending results of lab work - CBC with Differential/Platelet - Thyroid Panel With TSH  7. Urine frequency -No significant tenderness with exam. - Urine Culture - Urinalysis, Complete  8.embolic stroke with residual right-sided weakness   No orders of the defined types were placed in this encounter.  Patient Instructions                       Medicare Annual Wellness Visit  [facility] and the medical providers at [facility] strive to bring you the best medical care.  In doing so we not only want to address your current medical conditions and concerns but also to detect new conditions early and prevent illness, disease and health-related problems.    Medicare offers a yearly Wellness Visit which allows our clinical staff to assess your need for preventative services including immunizations, lifestyle  education, counseling to decrease risk of preventable diseases and screening for fall risk and other medical concerns.    This visit is provided free of charge (no copay) for all Medicare recipients. The clinical pharmacists at [facility] have begun to conduct these Wellness Visits which will also include a thorough review of all your medications.    As you primary medical provider recommend that you make an appointment for your Annual Wellness Visit if you have not done so already this year.  You may set up this appointment before you leave today or you may call back ([phone]) and schedule an appointment.  Please make sure when you call that you mention that you are  scheduling your Annual Wellness Visit with the clinical pharmacist so that the appointment may be made for the proper length of time.     Continue current medications. Continue good therapeutic lifestyle changes which include good diet and exercise. Fall precautions discussed with patient. If an FOBT was given today- please return it to our front desk. If you are over 52 years old - you may need Prevnar 35 or the adult Pneumonia vaccine.  **Flu shots are available--- please call and schedule a FLU-CLINIC appointment**  After your visit with [place] today you will receive a survey in the mail or online from [employer] regarding your care with [place]. Please take a moment to fill this out. Your feedback is very important to [place] as you can help [place] better understand your patient needs as well as improve your experience and satisfaction. WE CARE ABOUT YOU!!!  We will talk with the cardiologist to make sure there is no other bridging that can be done as far as protecting the patient from another stroke if we stopped the Xarelto. We will call the patient's husband with this information once it becomes available The patient should try to exercise more and eat less to get the blood sugar under better control We will call with  the hemoglobin A1c results as soon as it becomes available We will make sure that we send her surgeon Dr. gross a copy of the blood work that is being done.   Arrie Senate MD

## 2016-08-10 ENCOUNTER — Other Ambulatory Visit: Payer: Self-pay | Admitting: Nurse Practitioner

## 2016-08-10 LAB — CBC WITH DIFFERENTIAL/PLATELET
Basophils Absolute: 0.1 x10E3/uL (ref 0.0–0.2)
Basos: 1 %
EOS (ABSOLUTE): 0.4 x10E3/uL (ref 0.0–0.4)
Eos: 6 %
Hematocrit: 40.7 % (ref 34.0–46.6)
Hemoglobin: 13.3 g/dL (ref 11.1–15.9)
Immature Grans (Abs): 0 x10E3/uL (ref 0.0–0.1)
Immature Granulocytes: 0 %
Lymphocytes Absolute: 2.6 x10E3/uL (ref 0.7–3.1)
Lymphs: 33 %
MCH: 29.3 pg (ref 26.6–33.0)
MCHC: 32.7 g/dL (ref 31.5–35.7)
MCV: 90 fL (ref 79–97)
Monocytes Absolute: 0.8 x10E3/uL (ref 0.1–0.9)
Monocytes: 11 %
Neutrophils Absolute: 4 x10E3/uL (ref 1.4–7.0)
Neutrophils: 49 %
Platelets: 267 x10E3/uL (ref 150–379)
RBC: 4.54 x10E6/uL (ref 3.77–5.28)
RDW: 14.7 % (ref 12.3–15.4)
WBC: 7.9 x10E3/uL (ref 3.4–10.8)

## 2016-08-10 LAB — LIPID PANEL
Chol/HDL Ratio: 3.5 ratio (ref 0.0–4.4)
Cholesterol, Total: 107 mg/dL (ref 100–199)
HDL: 31 mg/dL — ABNORMAL LOW (ref >39)
LDL Calculated: 36 mg/dL (ref 0–99)
Triglycerides: 202 mg/dL — ABNORMAL HIGH (ref 0–149)
VLDL Cholesterol Cal: 40 mg/dL (ref 5–40)

## 2016-08-10 LAB — BMP8+EGFR
BUN/Creatinine Ratio: 13 (ref 12–28)
BUN: 7 mg/dL — ABNORMAL LOW (ref 8–27)
CO2: 23 mmol/L (ref 20–29)
Calcium: 10.1 mg/dL (ref 8.7–10.3)
Chloride: 100 mmol/L (ref 96–106)
Creatinine, Ser: 0.54 mg/dL — ABNORMAL LOW (ref 0.57–1.00)
GFR calc Af Amer: 112 mL/min/{1.73_m2} (ref >59)
GFR calc non Af Amer: 97 mL/min/{1.73_m2} (ref >59)
Glucose: 154 mg/dL — ABNORMAL HIGH (ref 65–99)
Potassium: 4.8 mmol/L (ref 3.5–5.2)
Sodium: 141 mmol/L (ref 134–144)

## 2016-08-10 LAB — HEPATIC FUNCTION PANEL
ALT: 19 IU/L (ref 0–32)
AST: 31 IU/L (ref 0–40)
Albumin: 4.3 g/dL (ref 3.6–4.8)
Alkaline Phosphatase: 60 IU/L (ref 39–117)
Bilirubin Total: 0.3 mg/dL (ref 0.0–1.2)
Bilirubin, Direct: 0.09 mg/dL (ref 0.00–0.40)
Total Protein: 6.9 g/dL (ref 6.0–8.5)

## 2016-08-10 LAB — THYROID PANEL WITH TSH
Free Thyroxine Index: 2.6 (ref 1.2–4.9)
T3 Uptake Ratio: 22 % — ABNORMAL LOW (ref 24–39)
T4, Total: 12 ug/dL (ref 4.5–12.0)
TSH: 2.94 u[IU]/mL (ref 0.450–4.500)

## 2016-08-10 LAB — VITAMIN D 25 HYDROXY (VIT D DEFICIENCY, FRACTURES): Vit D, 25-Hydroxy: 54.7 ng/mL (ref 30.0–100.0)

## 2016-08-11 ENCOUNTER — Other Ambulatory Visit: Payer: Self-pay | Admitting: Family Medicine

## 2016-08-15 LAB — URINE CULTURE

## 2016-08-16 ENCOUNTER — Other Ambulatory Visit: Payer: Self-pay | Admitting: *Deleted

## 2016-08-16 MED ORDER — CEFDINIR 300 MG PO CAPS: 300.0000 mg | ORAL_CAPSULE | Freq: Two times a day (BID) | ORAL | 0 refills | Status: DC

## 2016-08-17 ENCOUNTER — Ambulatory Visit: Admit: 2016-08-17 | Payer: Medicare Other | Admitting: Surgery

## 2016-08-17 SURGERY — REPAIR, HERNIA, VENTRAL, LAPAROSCOPIC
Anesthesia: General

## 2016-08-25 ENCOUNTER — Telehealth: Payer: Self-pay | Admitting: *Deleted

## 2016-08-25 NOTE — Telephone Encounter (Signed)
-----   Message from Chipper Herb, MD sent at 08/15/2016  3:14 PM EDT ----- Please call the patient's husband with these results  The patient has a positive urine culture for Proteus and Klebsiella. According to the note she had hives with penicillin. Please find out from the patient and or her husband if she has taken a cephalosporin in the past like Keflex. If she has and tolerated this without problems please start Omnicef 300 mg twice daily for 10 days with food and recheck urinalysis when completed

## 2016-08-25 NOTE — Telephone Encounter (Signed)
Pt currently taking Omnicef Will bring to sample for recheck

## 2016-09-04 ENCOUNTER — Other Ambulatory Visit: Payer: Self-pay | Admitting: Family Medicine

## 2016-09-06 ENCOUNTER — Other Ambulatory Visit: Payer: Medicare Other

## 2016-09-07 ENCOUNTER — Other Ambulatory Visit: Payer: Medicare Other

## 2016-09-07 ENCOUNTER — Other Ambulatory Visit: Payer: Self-pay | Admitting: Family Medicine

## 2016-09-07 ENCOUNTER — Other Ambulatory Visit: Payer: Self-pay | Admitting: *Deleted

## 2016-09-07 DIAGNOSIS — R399 Unspecified symptoms and signs involving the genitourinary system: Secondary | ICD-10-CM

## 2016-09-07 DIAGNOSIS — Z0189 Encounter for other specified special examinations: Secondary | ICD-10-CM

## 2016-09-08 ENCOUNTER — Other Ambulatory Visit: Payer: Self-pay | Admitting: Family Medicine

## 2016-09-11 LAB — URINALYSIS, COMPLETE
Bilirubin, UA: NEGATIVE
Glucose, UA: NEGATIVE
Leukocytes, UA: NEGATIVE
Nitrite, UA: NEGATIVE
Protein, UA: NEGATIVE
RBC, UA: NEGATIVE
Specific Gravity, UA: 1.025 (ref 1.005–1.030)
Urobilinogen, Ur: 0.2 mg/dL (ref 0.2–1.0)
pH, UA: 5 (ref 5.0–7.5)

## 2016-09-11 LAB — MICROSCOPIC EXAMINATION
Bacteria, UA: NONE SEEN
RBC, UA: NONE SEEN /hpf (ref 0–?)
Renal Epithel, UA: NONE SEEN /hpf

## 2016-09-12 ENCOUNTER — Other Ambulatory Visit: Payer: Self-pay | Admitting: *Deleted

## 2016-09-12 DIAGNOSIS — R300 Dysuria: Secondary | ICD-10-CM

## 2016-09-13 ENCOUNTER — Telehealth: Payer: Self-pay | Admitting: Family Medicine

## 2016-09-13 NOTE — Telephone Encounter (Signed)
Husband - will come get a spec cup = and bring in .Marland KitchenMarland Kitchen

## 2016-09-14 ENCOUNTER — Other Ambulatory Visit: Payer: Medicare Other

## 2016-09-14 DIAGNOSIS — R300 Dysuria: Secondary | ICD-10-CM

## 2016-09-15 LAB — URINALYSIS, COMPLETE
Bilirubin, UA: NEGATIVE
Nitrite, UA: POSITIVE — AB
Protein, UA: NEGATIVE
Specific Gravity, UA: 1.025 (ref 1.005–1.030)
Urobilinogen, Ur: 0.2 mg/dL (ref 0.2–1.0)
pH, UA: 5 (ref 5.0–7.5)

## 2016-09-15 LAB — MICROSCOPIC EXAMINATION
Epithelial Cells (non renal): NONE SEEN /hpf (ref 0–10)
RBC, UA: NONE SEEN /hpf (ref 0–?)

## 2016-09-16 LAB — URINE CULTURE

## 2016-09-18 ENCOUNTER — Other Ambulatory Visit: Payer: Self-pay | Admitting: *Deleted

## 2016-09-18 DIAGNOSIS — N390 Urinary tract infection, site not specified: Secondary | ICD-10-CM

## 2016-09-18 MED ORDER — NITROFURANTOIN MACROCRYSTAL 100 MG PO CAPS: 100.0000 mg | ORAL_CAPSULE | Freq: Two times a day (BID) | ORAL | 0 refills | Status: DC

## 2016-09-20 ENCOUNTER — Other Ambulatory Visit: Payer: Self-pay | Admitting: *Deleted

## 2016-09-20 ENCOUNTER — Telehealth: Payer: Self-pay | Admitting: Family Medicine

## 2016-09-20 MED ORDER — METFORMIN HCL ER 500 MG PO TB24: 1000.0000 mg | ORAL_TABLET | Freq: Every day | ORAL | 1 refills | Status: DC

## 2016-09-20 NOTE — Telephone Encounter (Signed)
Pt should be taking Metformin 500mg  2 tablets daily according to med list RX sent into [facility] per pt request Okayed per Dr Laurance Flatten Pt notified

## 2016-09-29 ENCOUNTER — Other Ambulatory Visit: Payer: Self-pay | Admitting: Family Medicine

## 2016-09-29 ENCOUNTER — Telehealth: Payer: Self-pay | Admitting: Family Medicine

## 2016-09-29 NOTE — Telephone Encounter (Signed)
Pt's husband states they had gotten a letter from Dr Johney Maine' office about not being able to get in touch with them and he was confused because he states they haven't tried to call. Advised pt's husband he should call them and talk to them. Pt's husband voiced understanding.

## 2016-10-12 ENCOUNTER — Other Ambulatory Visit: Payer: Medicare Other

## 2016-10-12 DIAGNOSIS — R300 Dysuria: Secondary | ICD-10-CM

## 2016-10-12 LAB — URINALYSIS, COMPLETE
Bilirubin, UA: NEGATIVE
Nitrite, UA: NEGATIVE
Protein, UA: NEGATIVE
RBC, UA: NEGATIVE
Specific Gravity, UA: 1.02 (ref 1.005–1.030)
Urobilinogen, Ur: 0.2 mg/dL (ref 0.2–1.0)
pH, UA: 5 (ref 5.0–7.5)

## 2016-10-12 LAB — MICROSCOPIC EXAMINATION
Bacteria, UA: NONE SEEN
Renal Epithel, UA: NONE SEEN /hpf

## 2016-10-15 LAB — URINE CULTURE

## 2016-10-16 ENCOUNTER — Other Ambulatory Visit: Payer: Self-pay | Admitting: Family Medicine

## 2016-10-16 ENCOUNTER — Other Ambulatory Visit: Payer: Self-pay | Admitting: *Deleted

## 2016-10-16 MED ORDER — CEFDINIR 300 MG PO CAPS: 300.0000 mg | ORAL_CAPSULE | Freq: Two times a day (BID) | ORAL | 0 refills | Status: DC

## 2016-10-30 ENCOUNTER — Ambulatory Visit (INDEPENDENT_AMBULATORY_CARE_PROVIDER_SITE_OTHER): Payer: Medicare Other | Admitting: *Deleted

## 2016-10-30 ENCOUNTER — Telehealth: Payer: Self-pay | Admitting: Family Medicine

## 2016-10-30 ENCOUNTER — Telehealth: Payer: Self-pay | Admitting: Cardiology

## 2016-10-30 DIAGNOSIS — Z1239 Encounter for other screening for malignant neoplasm of breast: Secondary | ICD-10-CM

## 2016-10-30 DIAGNOSIS — I495 Sick sinus syndrome: Secondary | ICD-10-CM | POA: Diagnosis not present

## 2016-10-30 NOTE — Telephone Encounter (Signed)
Confirmed remote transmission w/ pt husband.   

## 2016-10-30 NOTE — Telephone Encounter (Signed)
Order faxed.

## 2016-10-30 NOTE — Progress Notes (Signed)
Remote pacemaker transmission.   

## 2016-11-01 ENCOUNTER — Encounter: Payer: Self-pay | Admitting: Cardiology

## 2016-11-02 LAB — CUP PACEART REMOTE DEVICE CHECK
Date Time Interrogation Session: 20180913104741
Implantable Lead Implant Date: 20100630
Implantable Lead Implant Date: 20100630
Implantable Lead Location: 753859
Implantable Lead Location: 753860
Implantable Pulse Generator Implant Date: 20100630
Lead Channel Setting Pacing Amplitude: 2 V
Lead Channel Setting Pacing Amplitude: 2.5 V
Lead Channel Setting Pacing Pulse Width: 0.8 ms
Lead Channel Setting Sensing Sensitivity: 1.5 mV
Pulse Gen Model: 2110
Pulse Gen Serial Number: 2285953

## 2016-11-07 ENCOUNTER — Other Ambulatory Visit: Payer: Medicare Other

## 2016-11-07 DIAGNOSIS — N390 Urinary tract infection, site not specified: Secondary | ICD-10-CM

## 2016-11-07 DIAGNOSIS — R319 Hematuria, unspecified: Secondary | ICD-10-CM | POA: Diagnosis not present

## 2016-11-07 LAB — URINALYSIS, COMPLETE
Bilirubin, UA: NEGATIVE
Ketones, UA: NEGATIVE
Nitrite, UA: POSITIVE — AB
Protein, UA: NEGATIVE
Specific Gravity, UA: 1.025 (ref 1.005–1.030)
Urobilinogen, Ur: 0.2 mg/dL (ref 0.2–1.0)
pH, UA: 5.5 (ref 5.0–7.5)

## 2016-11-07 LAB — MICROSCOPIC EXAMINATION: WBC, UA: >30 /hpf — AB (ref 0–?)

## 2016-11-09 LAB — URINE CULTURE

## 2016-11-10 ENCOUNTER — Other Ambulatory Visit: Payer: Self-pay | Admitting: *Deleted

## 2016-11-10 ENCOUNTER — Encounter: Payer: Self-pay | Admitting: *Deleted

## 2016-11-10 MED ORDER — NITROFURANTOIN MONOHYD MACRO 100 MG PO CAPS: 100.0000 mg | ORAL_CAPSULE | Freq: Two times a day (BID) | ORAL | 0 refills | Status: DC

## 2016-11-10 NOTE — Progress Notes (Signed)
Pt called - appt With Dr Jeffie Pollock in [facility]  rx sent to pharm

## 2016-11-11 ENCOUNTER — Other Ambulatory Visit: Payer: Self-pay | Admitting: Family Medicine

## 2016-11-20 ENCOUNTER — Other Ambulatory Visit: Payer: Medicare Other

## 2016-11-20 DIAGNOSIS — R300 Dysuria: Secondary | ICD-10-CM

## 2016-11-21 ENCOUNTER — Encounter: Payer: Self-pay | Admitting: Family Medicine

## 2016-11-21 DIAGNOSIS — Z23 Encounter for immunization: Secondary | ICD-10-CM | POA: Diagnosis not present

## 2016-11-21 DIAGNOSIS — Z1231 Encounter for screening mammogram for malignant neoplasm of breast: Secondary | ICD-10-CM | POA: Diagnosis not present

## 2016-11-22 DIAGNOSIS — Z8673 Personal history of transient ischemic attack (TIA), and cerebral infarction without residual deficits: Secondary | ICD-10-CM | POA: Diagnosis not present

## 2016-11-22 DIAGNOSIS — L409 Psoriasis, unspecified: Secondary | ICD-10-CM | POA: Diagnosis not present

## 2016-11-22 LAB — URINE CULTURE

## 2016-12-01 ENCOUNTER — Other Ambulatory Visit (HOSPITAL_COMMUNITY)
Admission: RE | Admit: 2016-12-01 | Discharge: 2016-12-01 | Disposition: A | Discharge status: Home / Self Care | Payer: Medicare Other | Source: Other Acute Inpatient Hospital | Attending: Urology | Admitting: Urology

## 2016-12-01 ENCOUNTER — Ambulatory Visit (INDEPENDENT_AMBULATORY_CARE_PROVIDER_SITE_OTHER): Payer: Medicare Other | Admitting: Urology

## 2016-12-01 DIAGNOSIS — N3941 Urge incontinence: Secondary | ICD-10-CM

## 2016-12-01 DIAGNOSIS — N3020 Other chronic cystitis without hematuria: Secondary | ICD-10-CM | POA: Diagnosis not present

## 2016-12-01 LAB — URINALYSIS, COMPLETE (UACMP) WITH MICROSCOPIC
Bilirubin Urine: NEGATIVE
Glucose, UA: 150 mg/dL — AB
Hgb urine dipstick: NEGATIVE
Ketones, ur: NEGATIVE mg/dL
Nitrite: POSITIVE — AB
Protein, ur: NEGATIVE mg/dL
Specific Gravity, Urine: 1.019 (ref 1.005–1.030)
pH: 5 (ref 5.0–8.0)

## 2016-12-04 ENCOUNTER — Telehealth: Payer: Self-pay | Admitting: Family Medicine

## 2016-12-04 ENCOUNTER — Other Ambulatory Visit: Payer: Self-pay | Admitting: *Deleted

## 2016-12-04 DIAGNOSIS — Z79899 Other long term (current) drug therapy: Secondary | ICD-10-CM

## 2016-12-04 LAB — URINE CULTURE: Culture: >=100000 — AB

## 2016-12-04 NOTE — Telephone Encounter (Signed)
Lab ordered for DR Athena Masse  - per [facility] order  BP 145/67, 138/65-  Will keep record of BP x 1 week and call to report these to DR Laurance Flatten

## 2016-12-05 ENCOUNTER — Other Ambulatory Visit: Payer: Medicare Other

## 2016-12-05 ENCOUNTER — Other Ambulatory Visit: Payer: Self-pay | Admitting: Urology

## 2016-12-05 DIAGNOSIS — N3941 Urge incontinence: Secondary | ICD-10-CM

## 2016-12-05 DIAGNOSIS — N3020 Other chronic cystitis without hematuria: Secondary | ICD-10-CM

## 2016-12-05 DIAGNOSIS — Z79899 Other long term (current) drug therapy: Secondary | ICD-10-CM

## 2016-12-06 ENCOUNTER — Other Ambulatory Visit: Payer: Self-pay | Admitting: *Deleted

## 2016-12-06 DIAGNOSIS — Z79899 Other long term (current) drug therapy: Secondary | ICD-10-CM

## 2016-12-06 LAB — HEPATIC FUNCTION PANEL
ALT: 21 IU/L (ref 0–32)
AST: 32 IU/L (ref 0–40)
Albumin: 4.1 g/dL (ref 3.6–4.8)
Alkaline Phosphatase: 60 IU/L (ref 39–117)
Bilirubin Total: <0.2 mg/dL (ref 0.0–1.2)
Bilirubin, Direct: 0.09 mg/dL (ref 0.00–0.40)
Total Protein: 7 g/dL (ref 6.0–8.5)

## 2016-12-06 LAB — CBC WITH DIFFERENTIAL/PLATELET
Basophils Absolute: 0 10*3/uL (ref 0.0–0.2)
Basos: 0 %
EOS (ABSOLUTE): 0.7 10*3/uL — ABNORMAL HIGH (ref 0.0–0.4)
Eos: 7 %
Hematocrit: 41 % (ref 34.0–46.6)
Hemoglobin: 13.4 g/dL (ref 11.1–15.9)
Immature Grans (Abs): 0 10*3/uL (ref 0.0–0.1)
Immature Granulocytes: 0 %
Lymphocytes Absolute: 2.9 10*3/uL (ref 0.7–3.1)
Lymphs: 32 %
MCH: 29.1 pg (ref 26.6–33.0)
MCHC: 32.7 g/dL (ref 31.5–35.7)
MCV: 89 fL (ref 79–97)
Monocytes Absolute: 1 10*3/uL — ABNORMAL HIGH (ref 0.1–0.9)
Monocytes: 11 %
Neutrophils Absolute: 4.4 10*3/uL (ref 1.4–7.0)
Neutrophils: 50 %
Platelets: 224 10*3/uL (ref 150–379)
RBC: 4.6 x10E6/uL (ref 3.77–5.28)
RDW: 14.8 % (ref 12.3–15.4)
WBC: 9 10*3/uL (ref 3.4–10.8)

## 2016-12-11 ENCOUNTER — Other Ambulatory Visit: Payer: Medicare Other

## 2016-12-11 DIAGNOSIS — Z79899 Other long term (current) drug therapy: Secondary | ICD-10-CM | POA: Diagnosis not present

## 2016-12-12 LAB — CBC WITH DIFFERENTIAL/PLATELET
Basophils Absolute: 0 10*3/uL (ref 0.0–0.2)
Basos: 0 %
EOS (ABSOLUTE): 1.2 10*3/uL — ABNORMAL HIGH (ref 0.0–0.4)
Eos: 12 %
Hematocrit: 41.2 % (ref 34.0–46.6)
Hemoglobin: 13.6 g/dL (ref 11.1–15.9)
Immature Grans (Abs): 0.1 10*3/uL (ref 0.0–0.1)
Immature Granulocytes: 1 %
Lymphocytes Absolute: 2.9 10*3/uL (ref 0.7–3.1)
Lymphs: 30 %
MCH: 29.5 pg (ref 26.6–33.0)
MCHC: 33 g/dL (ref 31.5–35.7)
MCV: 89 fL (ref 79–97)
Monocytes Absolute: 0.9 10*3/uL (ref 0.1–0.9)
Monocytes: 10 %
Neutrophils Absolute: 4.5 10*3/uL (ref 1.4–7.0)
Neutrophils: 47 %
Platelets: 210 10*3/uL (ref 150–379)
RBC: 4.61 x10E6/uL (ref 3.77–5.28)
RDW: 14.9 % (ref 12.3–15.4)
WBC: 9.7 10*3/uL (ref 3.4–10.8)

## 2016-12-12 LAB — HEPATIC FUNCTION PANEL
ALT: 12 IU/L (ref 0–32)
AST: 20 IU/L (ref 0–40)
Albumin: 4 g/dL (ref 3.6–4.8)
Alkaline Phosphatase: 63 IU/L (ref 39–117)
Bilirubin Total: 0.2 mg/dL (ref 0.0–1.2)
Bilirubin, Direct: 0.08 mg/dL (ref 0.00–0.40)
Total Protein: 7.1 g/dL (ref 6.0–8.5)

## 2016-12-18 ENCOUNTER — Other Ambulatory Visit: Payer: Medicare Other

## 2016-12-18 DIAGNOSIS — Z79899 Other long term (current) drug therapy: Secondary | ICD-10-CM | POA: Diagnosis not present

## 2016-12-19 LAB — CBC WITH DIFFERENTIAL/PLATELET
Basophils Absolute: 0.1 10*3/uL (ref 0.0–0.2)
Basos: 1 %
EOS (ABSOLUTE): 1.6 10*3/uL — ABNORMAL HIGH (ref 0.0–0.4)
Eos: 18 %
Hematocrit: 40.9 % (ref 34.0–46.6)
Hemoglobin: 13.2 g/dL (ref 11.1–15.9)
Immature Grans (Abs): 0 10*3/uL (ref 0.0–0.1)
Immature Granulocytes: 1 %
Lymphocytes Absolute: 3.1 10*3/uL (ref 0.7–3.1)
Lymphs: 35 %
MCH: 29.5 pg (ref 26.6–33.0)
MCHC: 32.3 g/dL (ref 31.5–35.7)
MCV: 92 fL (ref 79–97)
Monocytes Absolute: 0.9 10*3/uL (ref 0.1–0.9)
Monocytes: 10 %
Neutrophils Absolute: 3 10*3/uL (ref 1.4–7.0)
Neutrophils: 35 %
Platelets: 230 10*3/uL (ref 150–379)
RBC: 4.47 x10E6/uL (ref 3.77–5.28)
RDW: 15.3 % (ref 12.3–15.4)
WBC: 8.7 10*3/uL (ref 3.4–10.8)

## 2016-12-19 LAB — HEPATIC FUNCTION PANEL
ALT: 26 IU/L (ref 0–32)
AST: 41 IU/L — ABNORMAL HIGH (ref 0–40)
Albumin: 4.1 g/dL (ref 3.6–4.8)
Alkaline Phosphatase: 66 IU/L (ref 39–117)
Bilirubin Total: <0.2 mg/dL (ref 0.0–1.2)
Bilirubin, Direct: 0.09 mg/dL (ref 0.00–0.40)
Total Protein: 7 g/dL (ref 6.0–8.5)

## 2016-12-20 ENCOUNTER — Ambulatory Visit: Payer: Medicare Other | Admitting: Family Medicine

## 2016-12-21 ENCOUNTER — Ambulatory Visit (INDEPENDENT_AMBULATORY_CARE_PROVIDER_SITE_OTHER): Payer: Medicare Other | Admitting: Family Medicine

## 2016-12-21 ENCOUNTER — Ambulatory Visit (INDEPENDENT_AMBULATORY_CARE_PROVIDER_SITE_OTHER): Payer: Medicare Other

## 2016-12-21 ENCOUNTER — Encounter: Payer: Self-pay | Admitting: Family Medicine

## 2016-12-21 VITALS — BP 131/78 | HR 71 | Temp 97.6°F | Ht 64.0 in | Wt 176.0 lb

## 2016-12-21 DIAGNOSIS — I6930 Unspecified sequelae of cerebral infarction: Secondary | ICD-10-CM | POA: Diagnosis not present

## 2016-12-21 DIAGNOSIS — E119 Type 2 diabetes mellitus without complications: Secondary | ICD-10-CM | POA: Diagnosis not present

## 2016-12-21 DIAGNOSIS — Z780 Asymptomatic menopausal state: Secondary | ICD-10-CM

## 2016-12-21 DIAGNOSIS — I480 Paroxysmal atrial fibrillation: Secondary | ICD-10-CM | POA: Diagnosis not present

## 2016-12-21 DIAGNOSIS — E785 Hyperlipidemia, unspecified: Secondary | ICD-10-CM

## 2016-12-21 DIAGNOSIS — E034 Atrophy of thyroid (acquired): Secondary | ICD-10-CM

## 2016-12-21 DIAGNOSIS — E559 Vitamin D deficiency, unspecified: Secondary | ICD-10-CM

## 2016-12-21 DIAGNOSIS — Z13820 Encounter for screening for osteoporosis: Secondary | ICD-10-CM | POA: Diagnosis not present

## 2016-12-21 DIAGNOSIS — I10 Essential (primary) hypertension: Secondary | ICD-10-CM

## 2016-12-21 LAB — BAYER DCA HB A1C WAIVED: HB A1C (BAYER DCA - WAIVED): 7.6 % — ABNORMAL HIGH (ref <7.0)

## 2016-12-21 MED ORDER — GLUCOSE BLOOD VI STRP: ORAL_STRIP | 11 refills | Status: DC

## 2016-12-21 NOTE — Progress Notes (Signed)
Subjective:    Patient ID: [name], female    DOB: [birth year], 71 y.o.   MRN: [id number]  HPI  Pt here for follow up and management of chronic medical problems which includes diabetes, hyperlipidemia, hypothyroid, and hypertension. She is taking medication regularly.  Comes to the visit today with her husband.  They have brought him blood pressures and blood sugars that have been checked regularly and these will be scanned into the record.  Overall these are good with the blood sugars tending to be up in the 150-170 range.  All the blood pressures are good.  The patient has recently been to see the urologist and was waiting on getting a renal ultrasound and finding the results of the culture report and I have not heard and we will check into this for them.  The patient comes with her husband and caregiver to the visit today.  They are waiting to hear from the urologist regarding a order for a renal ultrasound and a prophylactic antibiotic and we will help with that today.  The patient denies any chest pain or shortness of breath any more than usual.  She denies any trouble with nausea vomiting diarrhea blood in the stool or black tarry bowel movements.  She did have a recent virus and had some vomiting.  She is passing her water well now and is doing better since she has completed a course of Omnicef.  She was supposed to have a hernia repair surgery but because of the urinary tract infection this did not get done and were hoping that we can get the urine situation under control so that the hernia repair can be readdressed.  This was with Dr. gross.    Patient Active Problem List   Diagnosis Date Noted  . Type 2 diabetes mellitus ([place]) 05/17/2015  . Cerebrovascular accident (CVA) due to embolism of left middle cerebral artery ([place]) 11/24/2014  . Focal and partial seizures ([place]) 11/24/2014  . Depression 11/24/2014  . Cerebral infarction due to embolism of other cerebral artery ([place]) 09/03/2013    . Metabolic syndrome 69/48/5462  . High risk medication use 03/13/2013  . Fever, unspecified 06/05/2012  . Generalized abdominal pain 06/05/2012  . PAF (paroxysmal atrial fibrillation) ([place]) 02/07/2012  . Tachy-brady syndrome ([place])   . Impaired glucose tolerance   . Fatigue 12/22/2009  . SNORING 12/22/2009  . OTHER CHRONIC NONALCOHOLIC LIVER DISEASE 70/35/0093  . NONSPEC ELEVATION OF LEVELS OF TRANSAMINASE/LDH 03/09/2009  . PPM-[place] 09/01/2008  . BRADYCARDIA 08/13/2008  . Hypothyroidism 06/09/2008  . HTN (hypertension) 06/09/2008  . Atrial fibrillation ([place]) 06/09/2008  . PREMATURE VENTRICULAR CONTRACTIONS 06/09/2008  . IRRITABLE BOWEL SYNDROME 06/09/2008  . PSORIASIS 06/09/2008  . EDEMA 06/09/2008  . DIVERTICULOSIS, COLON, HX OF 06/09/2008   Outpatient Encounter Prescriptions as of 12/21/2016  Medication Sig  . acetaminophen (TYLENOL) 500 MG tablet Take 500 mg by mouth every 6 (six) hours as needed (pain).   . Ascorbic Acid (VITAMIN C PO) Take 1 tablet by mouth daily.  . Calcium Carbonate-Vitamin D (CALCIUM-VITAMIN D) 500-200 MG-UNIT per tablet Take 1 tablet by mouth daily.  . cholecalciferol (VITAMIN D) 1000 UNITS tablet Take 2,000 Units by mouth daily.   . citalopram (CELEXA) 20 MG tablet TAKE 1 TABLET(20 MG) BY MOUTH TWICE DAILY  . cyclobenzaprine (FLEXERIL) 5 MG tablet Take 1 tablet (5 mg total) by mouth 3 (three) times daily as needed for muscle spasms.  Marland Kitchen dicyclomine (BENTYL) 10 MG capsule TAKE 1 CAPSULE  BY MOUTH FOUR TIMES DAILY BEFORE MEALS AND AT BEDTIME  . fluticasone (FLONASE) 50 MCG/ACT nasal spray SHAKE LIQUID AND USE 2 SPRAYS IN EACH NOSTRIL DAILY  . folic acid (FOLVITE) 1 MG tablet TAKE 1 TABLET(1 MG) BY MOUTH DAILY  . glimepiride (AMARYL) 2 MG tablet TAKE 1 TABLET(2 MG) BY MOUTH DAILY BEFORE BREAKFAST  . levETIRAcetam (KEPPRA) 500 MG tablet Take 1 tablet (500 mg total) by mouth 2 (two) times daily.  Marland Kitchen levothyroxine (SYNTHROID, LEVOTHROID) 88 MCG tablet TAKE 1  TABLET BY MOUTH EVERY DAY BEFORE BREAKFAST  . loperamide (IMODIUM) 2 MG capsule Take by mouth as needed for diarrhea or loose stools (Take as directed). Reported on 05/17/2015  . metFORMIN (GLUCOPHAGE XR) 500 MG 24 hr tablet Take 2 tablets (1,000 mg total) by mouth daily with breakfast.  . methotrexate (RHEUMATREX) 2.5 MG tablet Take 4 tablets (10 mg total) by mouth once a week. Caution:Chemotherapy. Protect from light.  . metoprolol tartrate (LOPRESSOR) 50 MG tablet TAKE 1 TABLET(50 MG) BY MOUTH TWICE DAILY  . mirtazapine (REMERON) 15 MG tablet TAKE 1 TABLET(15 MG) BY MOUTH AT BEDTIME  . Multiple Vitamins-Minerals (EYE VITAMINS PO) Take 2 tablets by mouth daily. AREDS formula  . NYSTATIN powder APPLY TWICE DAILY UNDERNEATH BREASTS  . omeprazole (PRILOSEC) 40 MG capsule TAKE 1 CAPSULE BY MOUTH EVERY DAY  . ONE TOUCH ULTRA TEST test strip TEST BLOOD SUGAR ONCE A DAY  . potassium chloride SA (K-DUR,KLOR-CON) 20 MEQ tablet TAKE 2 TABLETS BY MOUTH TWICE DAILY  . Probiotic Product (PROBIOTIC PO) Take 1 capsule by mouth daily.  . rivaroxaban (XARELTO) 20 MG TABS tablet Take 1 tablet (20 mg total) by mouth daily.  . simvastatin (ZOCOR) 40 MG tablet TAKE 1 TABLET BY MOUTH EVERY DAY  . traMADol (ULTRAM) 50 MG tablet Take 1/2 tab by mouth four times daily as needed for severe pain  . XARELTO 20 MG TABS tablet TAKE 1 TABLET BY MOUTH EVERY DAY  . [DISCONTINUED] cefdinir (OMNICEF) 300 MG capsule Take 1 capsule (300 mg total) by mouth 2 (two) times daily. 1 po BID  . [DISCONTINUED] glimepiride (AMARYL) 2 MG tablet TAKE 1 TABLET(2 MG) BY MOUTH DAILY BEFORE BREAKFAST  . [DISCONTINUED] mirtazapine (REMERON) 15 MG tablet TAKE 1 TABLET(15 MG) BY MOUTH AT BEDTIME  . [DISCONTINUED] nitrofurantoin (MACRODANTIN) 100 MG capsule Take 1 capsule (100 mg total) by mouth 2 (two) times daily.  . [DISCONTINUED] nitrofurantoin, macrocrystal-monohydrate, (MACROBID) 100 MG capsule Take 1 capsule (100 mg total) by mouth 2 (two)  times daily. 1 po BId   No facility-administered encounter medications on file as of 12/21/2016.      Review of Systems  Constitutional: Negative.   HENT: Negative.   Eyes: Negative.   Respiratory: Negative.   Cardiovascular: Negative.   Gastrointestinal: Negative.   Endocrine: Negative.   Genitourinary: Negative.   Musculoskeletal: Positive for arthralgias (right side pain ) and back pain (on-going ).  Skin: Negative.   Allergic/Immunologic: Negative.   Neurological: Negative.   Hematological: Negative.   Psychiatric/Behavioral: Negative.        Objective:   Physical Exam  Constitutional: She is oriented to person, place, and time. She appears well-developed and well-nourished. No distress.  Patient is pleasant and alert and has a very attentive husband who is making sure everything gets done for her that can be done.  HENT:  Head: Normocephalic and atraumatic.  Right Ear: External ear normal.  Left Ear: External ear normal.  Nose: Nose normal.  Mouth/Throat: Oropharynx is clear and moist. No oropharyngeal exudate.  Right nostril has some dark red blood.  Eyes: Pupils are equal, round, and reactive to light. Conjunctivae and EOM are normal. Right eye exhibits no discharge. Left eye exhibits no discharge. No scleral icterus.  Neck: Normal range of motion. Neck supple. No thyromegaly present.  No bruits thyromegaly or anterior cervical adenopathy  Cardiovascular: Normal rate, regular rhythm, normal heart sounds and intact distal pulses.  Exam reveals no friction rub.   No murmur heard. Distal pulses were intact but weak especially on the right side.  Heart has a regular rate and rhythm at 72/min  Pulmonary/Chest: Effort normal and breath sounds normal. No respiratory distress. She has no wheezes. She has no rales. She exhibits no tenderness.  Abdominal: Soft. Bowel sounds are normal. She exhibits no mass. There is tenderness. There is no rebound and no guarding.  Patient has a  midline abdominal hernia that is sensitive to palpation.  There is no liver or spleen enlargement no masses other than the hernia and no bruits  Musculoskeletal: She exhibits no edema or tenderness.  The patient has right-sided weakness secondary to the CVA that she experienced several years ago.  Lymphadenopathy:    She has no cervical adenopathy.  Neurological: She is alert and oriented to person, place, and time. She has normal reflexes. No cranial nerve deficit.  Skin: Skin is warm and dry. No rash noted.  Psychiatric: She has a normal mood and affect. Her behavior is normal. Judgment and thought content normal.  Nursing note and vitals reviewed.   BP 140/85 (BP Location: Left Arm)   Pulse 73   Temp 97.6 F (36.4 C) (Oral)   Ht '5\' 4"'$  (1.626 m)   Wt 176 lb (79.8 kg)   BMI 30.21 kg/m        Assessment & Plan:  1. Type 2 diabetes mellitus without complication, without long-term current use of insulin ([place]) -Continue with therapeutic lifestyle changes and current treatment pending results of lab work - Bayer DCA Hb A1c Waived  2. Essential hypertension -Continue with current treatment and follow-up with cardiology as planned - BMP8+EGFR  3. Hyperlipidemia, unspecified hyperlipidemia type -Continue with current treatment pending results of lab work - Lipid panel  4. Vitamin D deficiency -Continue with current treatment pending results of lab work - VITAMIN D 25 Hydroxy (Vit-D Deficiency, Fractures) - DG [facility] DEXA; Future  5. PAF (paroxysmal atrial fibrillation) ([facility]) -Follow-up with Dr. Lovena Le as planned and monitor pacemaker as planned  6. Hypothyroidism due to acquired atrophy of thyroid -Continue current treatment pending results of lab work  7. Screening for osteoporosis - DG [facility] DEXA; Future  8. Postmenopausal - DG [facility] DEXA; Future  Meds ordered this encounter  Medications  . glucose blood (ONE TOUCH ULTRA TEST) test strip    Sig: TEST BLOOD SUGAR ONCE A  DAY    Dispense:  100 each    Refill:  11    Dx: E11.9   Patient Instructions                       Medicare Annual Wellness Visit  [facility] and the medical providers at [facility] strive to bring you the best medical care.  In doing so we not only want to address your current medical conditions and concerns but also to detect new conditions early and prevent illness, disease and health-related problems.    Medicare offers a  yearly Wellness Visit which allows our clinical staff to assess your need for preventative services including immunizations, lifestyle education, counseling to decrease risk of preventable diseases and screening for fall risk and other medical concerns.    This visit is provided free of charge (no copay) for all Medicare recipients. The clinical pharmacists at [facility] have begun to conduct these Wellness Visits which will also include a thorough review of all your medications.    As you primary medical provider recommend that you make an appointment for your Annual Wellness Visit if you have not done so already this year.  You may set up this appointment before you leave today or you may call back [phone]) and schedule an appointment.  Please make sure when you call that you mention that you are scheduling your Annual Wellness Visit with the clinical pharmacist so that the appointment may be made for the proper length of time.     Continue current medications. Continue good therapeutic lifestyle changes which include good diet and exercise. Fall precautions discussed with patient. If an FOBT was given today- please return it to our front desk. If you are over 65 years old - you may need Prevnar 56 or the adult Pneumonia vaccine.  **Flu shots are available--- please call and schedule a FLU-CLINIC appointment**  After your visit with [place] today you will receive a survey in the mail or online from [employer] regarding  your care with [place]. Please take a moment to fill this out. Your feedback is very important to [place] as you can help [place] better understand your patient needs as well as improve your experience and satisfaction. WE CARE ABOUT YOU!!!  Patient will continue to follow-up with her dermatologist for her psoriasis We will make sure he gets a copy of the lab work that we did for him which included the liver function test and CBC We will follow-up with a urologist to make sure that she gets placed on a prophylactic antibiotic to prevent future urinary tract infections so that she can get the abdominal hernia repaired by Dr. Johney Maine She should continue to drink plenty of fluids and stay well-hydrated and stay as active physically as possible with her husband supervising her. Continue to follow-up with cardiology Continue to follow-up with ophthalmologist See neurologist as needed  Arrie Senate MD

## 2016-12-21 NOTE — Patient Instructions (Addendum)
Medicare Annual Wellness Visit  [facility] and the medical providers at [facility] strive to bring you the best medical care.  In doing so we not only want to address your current medical conditions and concerns but also to detect new conditions early and prevent illness, disease and health-related problems.    Medicare offers a yearly Wellness Visit which allows our clinical staff to assess your need for preventative services including immunizations, lifestyle education, counseling to decrease risk of preventable diseases and screening for fall risk and other medical concerns.    This visit is provided free of charge (no copay) for all Medicare recipients. The clinical pharmacists at [facility] have begun to conduct these Wellness Visits which will also include a thorough review of all your medications.    As you primary medical provider recommend that you make an appointment for your Annual Wellness Visit if you have not done so already this year.  You may set up this appointment before you leave today or you may call back [phone]) and schedule an appointment.  Please make sure when you call that you mention that you are scheduling your Annual Wellness Visit with the clinical pharmacist so that the appointment may be made for the proper length of time.     Continue current medications. Continue good therapeutic lifestyle changes which include good diet and exercise. Fall precautions discussed with patient. If an FOBT was given today- please return it to our front desk. If you are over 86 years old - you may need Prevnar 4 or the adult Pneumonia vaccine.  **Flu shots are available--- please call and schedule a FLU-CLINIC appointment**  After your visit with [place] today you will receive a survey in the mail or online from [employer] regarding your care with [place]. Please take a moment to fill this out. Your feedback is very  important to [place] as you can help [place] better understand your patient needs as well as improve your experience and satisfaction. WE CARE ABOUT YOU!!!  Patient will continue to follow-up with her dermatologist for her psoriasis We will make sure he gets a copy of the lab work that we did for him which included the liver function test and CBC We will follow-up with a urologist to make sure that she gets placed on a prophylactic antibiotic to prevent future urinary tract infections so that she can get the abdominal hernia repaired by Dr. Johney Maine She should continue to drink plenty of fluids and stay well-hydrated and stay as active physically as possible with her husband supervising her. Continue to follow-up with cardiology Continue to follow-up with ophthalmologist See neurologist as needed

## 2016-12-22 ENCOUNTER — Other Ambulatory Visit: Payer: Self-pay | Admitting: Urology

## 2016-12-22 ENCOUNTER — Other Ambulatory Visit: Payer: Self-pay | Admitting: *Deleted

## 2016-12-22 DIAGNOSIS — N3020 Other chronic cystitis without hematuria: Secondary | ICD-10-CM

## 2016-12-22 DIAGNOSIS — E875 Hyperkalemia: Secondary | ICD-10-CM

## 2016-12-22 DIAGNOSIS — N3941 Urge incontinence: Secondary | ICD-10-CM

## 2016-12-22 LAB — LIPID PANEL
Chol/HDL Ratio: 3.5 ratio (ref 0.0–4.4)
Cholesterol, Total: 104 mg/dL (ref 100–199)
HDL: 30 mg/dL — ABNORMAL LOW (ref >39)
LDL Calculated: 33 mg/dL (ref 0–99)
Triglycerides: 204 mg/dL — ABNORMAL HIGH (ref 0–149)
VLDL Cholesterol Cal: 41 mg/dL — ABNORMAL HIGH (ref 5–40)

## 2016-12-22 LAB — BMP8+EGFR
BUN/Creatinine Ratio: 15 (ref 12–28)
BUN: 8 mg/dL (ref 8–27)
CO2: 24 mmol/L (ref 20–29)
Calcium: 10 mg/dL (ref 8.7–10.3)
Chloride: 100 mmol/L (ref 96–106)
Creatinine, Ser: 0.52 mg/dL — ABNORMAL LOW (ref 0.57–1.00)
GFR calc Af Amer: 113 mL/min/{1.73_m2} (ref >59)
GFR calc non Af Amer: 98 mL/min/{1.73_m2} (ref >59)
Glucose: 127 mg/dL — ABNORMAL HIGH (ref 65–99)
Potassium: 5.4 mmol/L — ABNORMAL HIGH (ref 3.5–5.2)
Sodium: 141 mmol/L (ref 134–144)

## 2016-12-22 LAB — VITAMIN D 25 HYDROXY (VIT D DEFICIENCY, FRACTURES): Vit D, 25-Hydroxy: 73.9 ng/mL (ref 30.0–100.0)

## 2016-12-25 ENCOUNTER — Other Ambulatory Visit: Payer: Medicare Other

## 2016-12-25 DIAGNOSIS — E875 Hyperkalemia: Secondary | ICD-10-CM

## 2016-12-26 ENCOUNTER — Other Ambulatory Visit: Payer: Self-pay | Admitting: Urology

## 2016-12-26 DIAGNOSIS — N3941 Urge incontinence: Secondary | ICD-10-CM

## 2016-12-26 DIAGNOSIS — N3020 Other chronic cystitis without hematuria: Secondary | ICD-10-CM

## 2016-12-26 LAB — BMP8+EGFR
BUN/Creatinine Ratio: 16 (ref 12–28)
BUN: 8 mg/dL (ref 8–27)
CO2: 26 mmol/L (ref 20–29)
Calcium: 10 mg/dL (ref 8.7–10.3)
Chloride: 99 mmol/L (ref 96–106)
Creatinine, Ser: 0.5 mg/dL — ABNORMAL LOW (ref 0.57–1.00)
GFR calc Af Amer: 114 mL/min/{1.73_m2} (ref >59)
GFR calc non Af Amer: 99 mL/min/{1.73_m2} (ref >59)
Glucose: 148 mg/dL — ABNORMAL HIGH (ref 65–99)
Potassium: 4.9 mmol/L (ref 3.5–5.2)
Sodium: 140 mmol/L (ref 134–144)

## 2017-01-05 ENCOUNTER — Ambulatory Visit (INDEPENDENT_AMBULATORY_CARE_PROVIDER_SITE_OTHER): Payer: Medicare Other | Admitting: Urology

## 2017-01-05 DIAGNOSIS — N3941 Urge incontinence: Secondary | ICD-10-CM

## 2017-01-05 DIAGNOSIS — N3020 Other chronic cystitis without hematuria: Secondary | ICD-10-CM | POA: Diagnosis not present

## 2017-01-08 ENCOUNTER — Other Ambulatory Visit: Payer: Self-pay | Admitting: Urology

## 2017-01-08 DIAGNOSIS — N3941 Urge incontinence: Secondary | ICD-10-CM

## 2017-01-08 DIAGNOSIS — N3020 Other chronic cystitis without hematuria: Secondary | ICD-10-CM

## 2017-01-12 ENCOUNTER — Other Ambulatory Visit: Payer: Self-pay | Admitting: Family Medicine

## 2017-01-15 ENCOUNTER — Ambulatory Visit (HOSPITAL_COMMUNITY)
Admission: RE | Admit: 2017-01-15 | Discharge: 2017-01-15 | Disposition: A | Discharge status: Home / Self Care | Payer: Medicare Other | Source: Ambulatory Visit | Attending: Urology | Admitting: Urology

## 2017-01-15 DIAGNOSIS — N3941 Urge incontinence: Secondary | ICD-10-CM | POA: Insufficient documentation

## 2017-01-15 DIAGNOSIS — K760 Fatty (change of) liver, not elsewhere classified: Secondary | ICD-10-CM | POA: Diagnosis not present

## 2017-01-15 DIAGNOSIS — N3020 Other chronic cystitis without hematuria: Secondary | ICD-10-CM | POA: Diagnosis not present

## 2017-01-19 ENCOUNTER — Telehealth: Payer: Self-pay | Admitting: Family Medicine

## 2017-01-19 NOTE — Telephone Encounter (Signed)
US renal 11/26- please review and advise.

## 2017-01-19 NOTE — Telephone Encounter (Signed)
The ultrasound that the patient's husband is referring to was ordered by Dr. Irine Seal.  The ultrasound appearance of the kidneys was within normal limits with no stones masses or obstruction being noted.  The pre-and post void bladder volumes were as recorded.  There is also a fatty liver which is hepatic steatosis. Please call patient's husband Mortimer Fries with this result

## 2017-01-23 DIAGNOSIS — H353132 Nonexudative age-related macular degeneration, bilateral, intermediate dry stage: Secondary | ICD-10-CM | POA: Diagnosis not present

## 2017-01-29 ENCOUNTER — Encounter: Payer: Medicare Other | Admitting: *Deleted

## 2017-02-02 ENCOUNTER — Encounter: Payer: Self-pay | Admitting: Cardiology

## 2017-02-05 ENCOUNTER — Other Ambulatory Visit: Payer: Self-pay | Admitting: Family Medicine

## 2017-02-15 ENCOUNTER — Ambulatory Visit (INDEPENDENT_AMBULATORY_CARE_PROVIDER_SITE_OTHER): Payer: Medicare Other | Admitting: *Deleted

## 2017-02-15 DIAGNOSIS — I495 Sick sinus syndrome: Secondary | ICD-10-CM

## 2017-02-16 ENCOUNTER — Encounter: Payer: Self-pay | Admitting: Cardiology

## 2017-02-16 NOTE — Progress Notes (Signed)
Remote pacemaker transmission.   

## 2017-02-17 ENCOUNTER — Other Ambulatory Visit: Payer: Self-pay | Admitting: Neurology

## 2017-02-19 ENCOUNTER — Other Ambulatory Visit: Payer: Self-pay | Admitting: Family Medicine

## 2017-02-22 ENCOUNTER — Other Ambulatory Visit: Payer: Self-pay | Admitting: Family Medicine

## 2017-02-24 ENCOUNTER — Other Ambulatory Visit: Payer: Self-pay | Admitting: Family Medicine

## 2017-02-28 DIAGNOSIS — H542X11 Low vision right eye category 1, low vision left eye category 1: Secondary | ICD-10-CM | POA: Diagnosis not present

## 2017-02-28 DIAGNOSIS — Z961 Presence of intraocular lens: Secondary | ICD-10-CM | POA: Diagnosis not present

## 2017-02-28 DIAGNOSIS — H353130 Nonexudative age-related macular degeneration, bilateral, stage unspecified: Secondary | ICD-10-CM | POA: Diagnosis not present

## 2017-03-05 LAB — CUP PACEART REMOTE DEVICE CHECK
Battery Remaining Longevity: 64 mo
Battery Remaining Percentage: 65 %
Battery Voltage: 2.9 V
Brady Statistic RA Percent Paced: 1 % — CL
Brady Statistic RV Percent Paced: 1 % — CL
Date Time Interrogation Session: 20190114154232
Implantable Lead Implant Date: 20100630
Implantable Lead Implant Date: 20100630
Implantable Lead Location: 753859
Implantable Lead Location: 753860
Implantable Pulse Generator Implant Date: 20100630
Lead Channel Impedance Value: 410 Ohm
Lead Channel Impedance Value: 460 Ohm
Lead Channel Sensing Intrinsic Amplitude: 4.7 mV
Lead Channel Sensing Intrinsic Amplitude: 5.1 mV
Lead Channel Setting Pacing Amplitude: 2 V
Lead Channel Setting Pacing Amplitude: 2.5 V
Lead Channel Setting Pacing Pulse Width: 0.8 ms
Lead Channel Setting Sensing Sensitivity: 1.5 mV
Pulse Gen Model: 2110
Pulse Gen Serial Number: 2285953

## 2017-03-15 DIAGNOSIS — H542X11 Low vision right eye category 1, low vision left eye category 1: Secondary | ICD-10-CM | POA: Diagnosis not present

## 2017-03-15 DIAGNOSIS — H353130 Nonexudative age-related macular degeneration, bilateral, stage unspecified: Secondary | ICD-10-CM | POA: Diagnosis not present

## 2017-03-15 DIAGNOSIS — Z961 Presence of intraocular lens: Secondary | ICD-10-CM | POA: Diagnosis not present

## 2017-03-20 ENCOUNTER — Other Ambulatory Visit: Payer: Self-pay | Admitting: Family Medicine

## 2017-03-30 ENCOUNTER — Other Ambulatory Visit: Payer: Self-pay | Admitting: Family Medicine

## 2017-04-01 ENCOUNTER — Other Ambulatory Visit: Payer: Self-pay | Admitting: Family Medicine

## 2017-04-05 ENCOUNTER — Other Ambulatory Visit: Payer: Self-pay | Admitting: Family Medicine

## 2017-04-06 ENCOUNTER — Ambulatory Visit (INDEPENDENT_AMBULATORY_CARE_PROVIDER_SITE_OTHER): Payer: Medicare Other | Admitting: Urology

## 2017-04-06 DIAGNOSIS — N3941 Urge incontinence: Secondary | ICD-10-CM

## 2017-04-06 DIAGNOSIS — N3020 Other chronic cystitis without hematuria: Secondary | ICD-10-CM

## 2017-04-19 ENCOUNTER — Other Ambulatory Visit: Payer: Self-pay | Admitting: Family Medicine

## 2017-04-23 ENCOUNTER — Other Ambulatory Visit: Payer: Self-pay | Admitting: *Deleted

## 2017-04-23 MED ORDER — GLUCOSE BLOOD VI STRP: ORAL_STRIP | 3 refills | Status: DC

## 2017-04-26 ENCOUNTER — Encounter: Payer: Self-pay | Admitting: Family Medicine

## 2017-04-26 ENCOUNTER — Ambulatory Visit (INDEPENDENT_AMBULATORY_CARE_PROVIDER_SITE_OTHER): Payer: Medicare Other | Admitting: Family Medicine

## 2017-04-26 ENCOUNTER — Ambulatory Visit (INDEPENDENT_AMBULATORY_CARE_PROVIDER_SITE_OTHER): Payer: Medicare Other

## 2017-04-26 VITALS — BP 122/84 | HR 69 | Temp 97.1°F | Ht 64.0 in | Wt 175.0 lb

## 2017-04-26 DIAGNOSIS — I10 Essential (primary) hypertension: Secondary | ICD-10-CM | POA: Diagnosis not present

## 2017-04-26 DIAGNOSIS — K429 Umbilical hernia without obstruction or gangrene: Secondary | ICD-10-CM

## 2017-04-26 DIAGNOSIS — N390 Urinary tract infection, site not specified: Secondary | ICD-10-CM

## 2017-04-26 DIAGNOSIS — E119 Type 2 diabetes mellitus without complications: Secondary | ICD-10-CM

## 2017-04-26 DIAGNOSIS — E034 Atrophy of thyroid (acquired): Secondary | ICD-10-CM

## 2017-04-26 DIAGNOSIS — E559 Vitamin D deficiency, unspecified: Secondary | ICD-10-CM | POA: Diagnosis not present

## 2017-04-26 DIAGNOSIS — I6930 Unspecified sequelae of cerebral infarction: Secondary | ICD-10-CM | POA: Diagnosis not present

## 2017-04-26 DIAGNOSIS — I6349 Cerebral infarction due to embolism of other cerebral artery: Secondary | ICD-10-CM

## 2017-04-26 DIAGNOSIS — G8311 Monoplegia of lower limb affecting right dominant side: Secondary | ICD-10-CM | POA: Diagnosis not present

## 2017-04-26 DIAGNOSIS — E785 Hyperlipidemia, unspecified: Secondary | ICD-10-CM

## 2017-04-26 DIAGNOSIS — I480 Paroxysmal atrial fibrillation: Secondary | ICD-10-CM

## 2017-04-26 LAB — BAYER DCA HB A1C WAIVED: HB A1C (BAYER DCA - WAIVED): 7.7 % — ABNORMAL HIGH (ref <7.0)

## 2017-04-26 NOTE — Progress Notes (Signed)
Subjective:    Patient ID: [name], female    DOB: 1945/12/29, 72 y.o.   MRN: [id number]  HPI Pt here for follow up and management of chronic medical problems which includes diabetes, hypertension and hyperlipidemia. She is taking medication regularly.  The patient has been followed regularly by Dr. Irine Seal for recurrent urinary tract infections.  She is also waiting to get a hernia repair but no hernia repair will be done until the infection is get cleared up.  This chronic cystitis and urge incontinence is improving in the last note we have from Dr. Jeffie Pollock was dated 01/05/2017.  He was supposed to see her back 3 months after that visit for repeat cystoscopy.  She has been on Keflex for suppression.  She comes to the visit today with her husband has been so very supportive of her.  She sees Dr. gross and hernia repair is planned once the urinary tract infection is resolved.  She complains of some right-sided abdominal pain and has some congestion with wheezing.  She is due to return in FOBT and will get lab work today.  She is also due to get her mammogram and we need to make sure that this gets scheduled.  Her blood pressure is slightly elevated today at 156/89.  All blood pressure readings from home are excellent and will be scanned into the record.  Fasting blood sugars are running from the 170s up to the 190s.  All these blood pressures and blood sugars will be scanned into the record.  She has had a recent eye appointment with Dr. Tasia Catchings.  Her blood pressure with Dr. Jeffie Pollock and November was 140/85.  The recent history was reviewed with her husband being present.  The patient does not have to see Dr. Jeffie Pollock again for about 2-1/2 months.  She is back on Keflex after taking another antibiotic for short period of time following a cystoscopy procedure.  Unfortunately she has had to cancel the appointment several times with Dr. gross for the hernia repair.  I am encouraging the husband to get an  appointment with Dr. gross sometime in about a month so that may be surgery can be accomplished in May.  We will make sure with Dr. Jeffie Pollock that the urinary tract infection is clear at that time.  The patient sees Dr. Lovena Le, the cardiologist that he does follow her pacemaker.  He has seen her regularly and as planned.  She also sees a neurologist but those visits seem to be less often because she is more stable.  She denies any overt chest pain pressure or tightness and has no shortness of breath.  She denies any trouble with her stomach as far as nausea vomiting diarrhea blood in the stool or black tarry bowel movements but does have some right sided abdominal pain.  She is passing her water now without problems.    Patient Active Problem List   Diagnosis Date Noted  . Type 2 diabetes mellitus ([place]) 05/17/2015  . Cerebrovascular accident (CVA) due to embolism of left middle cerebral artery ([place]) 11/24/2014  . Focal and partial seizures ([place]) 11/24/2014  . Depression 11/24/2014  . Cerebral infarction due to embolism of other cerebral artery ([place]) 09/03/2013  . Metabolic syndrome 56/38/7564  . High risk medication use 03/13/2013  . Fever, unspecified 06/05/2012  . Generalized abdominal pain 06/05/2012  . PAF (paroxysmal atrial fibrillation) ([facility]) 02/07/2012  . Tachy-brady syndrome ([place])   . Impaired glucose tolerance   .  Fatigue 12/22/2009  . SNORING 12/22/2009  . OTHER CHRONIC NONALCOHOLIC LIVER DISEASE 97/98/9211  . NONSPEC ELEVATION OF LEVELS OF TRANSAMINASE/LDH 03/09/2009  . PPM-[place] 09/01/2008  . BRADYCARDIA 08/13/2008  . Hypothyroidism 06/09/2008  . HTN (hypertension) 06/09/2008  . Atrial fibrillation ([place]) 06/09/2008  . PREMATURE VENTRICULAR CONTRACTIONS 06/09/2008  . IRRITABLE BOWEL SYNDROME 06/09/2008  . PSORIASIS 06/09/2008  . EDEMA 06/09/2008  . DIVERTICULOSIS, COLON, HX OF 06/09/2008   Outpatient Encounter Medications as of 04/26/2017  Medication Sig  . acetaminophen  (TYLENOL) 500 MG tablet Take 500 mg by mouth every 6 (six) hours as needed (pain).   . Ascorbic Acid (VITAMIN C PO) Take 1 tablet by mouth daily.  . Calcium Carbonate-Vitamin D (CALCIUM-VITAMIN D) 500-200 MG-UNIT per tablet Take 1 tablet by mouth daily.  . cholecalciferol (VITAMIN D) 1000 UNITS tablet Take 2,000 Units by mouth daily.   . citalopram (CELEXA) 20 MG tablet TAKE 1 TABLET(20 MG) BY MOUTH TWICE DAILY  . cyclobenzaprine (FLEXERIL) 5 MG tablet Take 1 tablet (5 mg total) by mouth 3 (three) times daily as needed for muscle spasms.  Marland Kitchen dicyclomine (BENTYL) 10 MG capsule TAKE 1 CAPSULE BY MOUTH FOUR TIMES DAILY BEFORE MEALS AND AT BEDTIME  . fluticasone (FLONASE) 50 MCG/ACT nasal spray SHAKE LIQUID AND USE 2 SPRAYS IN EACH NOSTRIL DAILY  . folic acid (FOLVITE) 1 MG tablet TAKE 1 TABLET(1 MG) BY MOUTH DAILY  . glimepiride (AMARYL) 2 MG tablet TAKE 1 TABLET(2 MG) BY MOUTH DAILY BEFORE BREAKFAST  . glucose blood (ONE TOUCH ULTRA TEST) test strip TEST BLOOD SUGAR ONCE A DAY  . levETIRAcetam (KEPPRA) 500 MG tablet Take 1 tablet (500 mg total) by mouth 2 (two) times daily.  Marland Kitchen levothyroxine (SYNTHROID, LEVOTHROID) 88 MCG tablet TAKE 1 TABLET BY MOUTH EVERY DAY BEFORE BREAKFAST  . loperamide (IMODIUM) 2 MG capsule Take by mouth as needed for diarrhea or loose stools (Take as directed). Reported on 05/17/2015  . metFORMIN (GLUCOPHAGE-XR) 500 MG 24 hr tablet TAKE 2 TABLETS BY MOUTH EVERY DAY WITH BREAKFAST  . methotrexate (RHEUMATREX) 2.5 MG tablet Take 4 tablets (10 mg total) by mouth once a week. Caution:Chemotherapy. Protect from light.  . metoprolol tartrate (LOPRESSOR) 50 MG tablet TAKE 1 TABLET(50 MG) BY MOUTH TWICE DAILY  . mirtazapine (REMERON) 15 MG tablet TAKE 1 TABLET(15 MG) BY MOUTH AT BEDTIME  . Multiple Vitamins-Minerals (EYE VITAMINS PO) Take 2 tablets by mouth daily. AREDS formula  . NYSTATIN powder APPLY TWICE DAILY UNDERNEATH BREASTS  . omeprazole (PRILOSEC) 40 MG capsule TAKE 1  CAPSULE BY MOUTH EVERY DAY  . potassium chloride SA (K-DUR,KLOR-CON) 20 MEQ tablet TAKE 2 TABLETS BY MOUTH TWICE DAILY  . Probiotic Product (PROBIOTIC PO) Take 1 capsule by mouth daily.  . simvastatin (ZOCOR) 40 MG tablet TAKE 1 TABLET BY MOUTH EVERY DAY  . traMADol (ULTRAM) 50 MG tablet Take 1/2 tab by mouth four times daily as needed for severe pain  . XARELTO 20 MG TABS tablet TAKE 1 TABLET BY MOUTH EVERY DAY  . [DISCONTINUED] citalopram (CELEXA) 20 MG tablet TAKE 1 TABLET BY MOUTH TWICE DAILY  . [DISCONTINUED] glimepiride (AMARYL) 2 MG tablet TAKE 1 TABLET(2 MG) BY MOUTH DAILY BEFORE BREAKFAST  . [DISCONTINUED] levETIRAcetam (KEPPRA) 500 MG tablet TAKE 1 TABLET BY MOUTH TWICE DAILY  . [DISCONTINUED] mirtazapine (REMERON) 15 MG tablet TAKE 1 TABLET(15 MG) BY MOUTH AT BEDTIME  . [DISCONTINUED] rivaroxaban (XARELTO) 20 MG TABS tablet Take 1 tablet (20 mg total) by mouth daily.  No facility-administered encounter medications on file as of 04/26/2017.       Review of Systems  Constitutional: Negative.   HENT: Positive for congestion.   Eyes: Negative.   Respiratory: Positive for wheezing.   Cardiovascular: Negative.   Gastrointestinal: Positive for abdominal pain (right side pain ).  Endocrine: Negative.   Genitourinary: Negative.   Musculoskeletal: Negative.   Skin: Negative.   Allergic/Immunologic: Negative.   Neurological: Negative.   Hematological: Negative.   Psychiatric/Behavioral: Negative.        Objective:   Physical Exam  Constitutional: She is oriented to person, place, and time. She appears well-developed and well-nourished. No distress.  Patient is doing great considering where she is come from  HENT:  Head: Normocephalic and atraumatic.  Right Ear: External ear normal.  Left Ear: External ear normal.  Mouth/Throat: Oropharynx is clear and moist. No oropharyngeal exudate.  Both nasal septums are irritated and have eschars and bright red blood on them.  Eyes:  Conjunctivae and EOM are normal. Pupils are equal, round, and reactive to light. Right eye exhibits no discharge. Left eye exhibits no discharge. No scleral icterus.  Neck: Normal range of motion. Neck supple. No thyromegaly present.  Cardiovascular: Normal rate, regular rhythm, normal heart sounds and intact distal pulses.  No murmur heard. The heart is regular at 72/min  Pulmonary/Chest: Effort normal and breath sounds normal. No respiratory distress. She has no wheezes. She has no rales.  Basilar congestion right lung posteriorly may be atelectasis.  Otherwise good breath sounds anteriorly and posteriorly and no increased congestion with coughing  Abdominal: Soft. Bowel sounds are normal. She exhibits distension. She exhibits no mass. There is tenderness. There is no rebound and no guarding.  Large midline abdominal hernia that Dr. gross has been following and plans to repair.  No liver or spleen enlargement.  Slight suprapubic tenderness.  No masses.  Musculoskeletal: She exhibits no edema or tenderness.  Patient walks using a cane holding this with the left hand.  The right hand is in an arm sling.  Some back pain with laying down on the table  Lymphadenopathy:    She has no cervical adenopathy.  Neurological: She is alert and oriented to person, place, and time. No cranial nerve deficit.  The patient appears alert just has some problems with expressing herself but no worse than usual  Skin: Skin is warm and dry. No rash noted.  Psychiatric: She has a normal mood and affect. Her behavior is normal. Judgment and thought content normal.  Nursing note and vitals reviewed.  BP (!) 156/89 (BP Location: Left Arm)   Pulse 69   Temp (!) 97.1 F (36.2 C) (Oral)   Ht '5\' 4"'$  (1.626 m)   Wt 175 lb (79.4 kg)   BMI 30.04 kg/m   Repeat blood pressure was 122/84 in the left arm sitting with a large cuff  Chest x-ray with results pending====    Assessment & Plan:  1. Type 2 diabetes mellitus  without complication, without long-term current use of insulin ([place]) -Continue with current treatment pending results of lab work - CBC with Differential/Platelet - [place] [place] Hb A1c Waived  2. Essential hypertension -Repeat blood pressure was good and consistent with home readings - CBC with Differential/Platelet - BMP8+EGFR - Hepatic function panel - DG Chest 2 View; Future  3. Hyperlipidemia, unspecified hyperlipidemia type -Continue with current treatment and with as aggressive therapeutic lifestyle changes as possible pending results of lab - CBC with Differential/Platelet -  Lipid panel - DG Chest 2 View; Future  4. Vitamin D deficiency -Any with current treatment - CBC with Differential/Platelet - VITAMIN D 25 Hydroxy (Vit-D Deficiency, Fractures)  5. PAF (paroxysmal atrial fibrillation) ([place]) -Follow-up with cardiology as planned - CBC with Differential/Platelet  6. Hypothyroidism due to acquired atrophy of thyroid -10 you current treatment pending results of lab work - CBC with Differential/Platelet - Thyroid Panel With TSH  7. Cerebral infarction due to embolism of other cerebral artery ([facility]) -Has residual right sided paralysis and speech impediment following this stroke.  She is stable.  8. Paralysis of right lower extremity ([facility]) -Continues to be stable following stroke.  9.  Recurrent urinary tract infections -Follow-up with Dr. Jeffie Pollock as planned  10.  Large ventral hernia -Follow-up with Dr. gross as planned and arrange eventual surgery   Patient Instructions                       Medicare Annual Wellness Visit  [facility] and the medical providers at [facility] strive to bring you the best medical care.  In doing so we not only want to address your current medical conditions and concerns but also to detect new conditions early and prevent illness, disease and health-related problems.    Medicare offers a yearly Wellness Visit  which allows our clinical staff to assess your need for preventative services including immunizations, lifestyle education, counseling to decrease risk of preventable diseases and screening for fall risk and other medical concerns.    This visit is provided free of charge (no copay) for all Medicare recipients. The clinical pharmacists at [facility] have begun to conduct these Wellness Visits which will also include a thorough review of all your medications.    As you primary medical provider recommend that you make an appointment for your Annual Wellness Visit if you have not done so already this year.  You may set up this appointment before you leave today or you may call back ([phone]) and schedule an appointment.  Please make sure when you call that you mention that you are scheduling your Annual Wellness Visit with the clinical pharmacist so that the appointment may be made for the proper length of time.     Continue current medications. Continue good therapeutic lifestyle changes which include good diet and exercise. Fall precautions discussed with patient. If an FOBT was given today- please return it to our front desk. If you are over 24 years old - you may need Prevnar 23 or the adult Pneumonia vaccine.  **Flu shots are available--- please call and schedule a FLU-CLINIC appointment**  After your visit with [place] today you will receive a survey in the mail or online from [employer] regarding your care with [place]. Please take a moment to fill this out. Your feedback is very important to [place] as you can help [place] better understand your patient needs as well as improve your experience and satisfaction. WE CARE ABOUT YOU!!!   The patient to use nasal saline gel in each nostril nightly She should continue to use the coolmist humidifier She should follow-up with the cardiologist and urologist as planned The husband should make an appointment for her to see the surgeon to plan  this abdominal hernia repair She should continue to drink plenty of fluids and stay well-hydrated He should discuss with the ophthalmologist the benefit or not benefit of the glasses that he has paid money for that  do not seem to be helping the patient    Arrie Senate MD

## 2017-04-26 NOTE — Patient Instructions (Addendum)
Medicare Annual Wellness Visit  [facility] and the medical providers at [facility] strive to bring you the best medical care.  In doing so we not only want to address your current medical conditions and concerns but also to detect new conditions early and prevent illness, disease and health-related problems.    Medicare offers a yearly Wellness Visit which allows our clinical staff to assess your need for preventative services including immunizations, lifestyle education, counseling to decrease risk of preventable diseases and screening for fall risk and other medical concerns.    This visit is provided free of charge (no copay) for all Medicare recipients. The clinical pharmacists at [facility] have begun to conduct these Wellness Visits which will also include a thorough review of all your medications.    As you primary medical provider recommend that you make an appointment for your Annual Wellness Visit if you have not done so already this year.  You may set up this appointment before you leave today or you may call back [phone]) and schedule an appointment.  Please make sure when you call that you mention that you are scheduling your Annual Wellness Visit with the clinical pharmacist so that the appointment may be made for the proper length of time.     Continue current medications. Continue good therapeutic lifestyle changes which include good diet and exercise. Fall precautions discussed with patient. If an FOBT was given today- please return it to our front desk. If you are over 10 years old - you may need Prevnar 69 or the adult Pneumonia vaccine.  **Flu shots are available--- please call and schedule a FLU-CLINIC appointment**  After your visit with [place] today you will receive a survey in the mail or online from [employer] regarding your care with [place]. Please take a moment to fill this out. Your feedback is very  important to [place] as you can help [place] better understand your patient needs as well as improve your experience and satisfaction. WE CARE ABOUT YOU!!!   The patient to use nasal saline gel in each nostril nightly She should continue to use the coolmist humidifier She should follow-up with the cardiologist and urologist as planned The husband should make an appointment for her to see the surgeon to plan this abdominal hernia repair She should continue to drink plenty of fluids and stay well-hydrated He should discuss with the ophthalmologist the benefit or not benefit of the glasses that he has paid money for that do not seem to be helping the patient

## 2017-04-27 ENCOUNTER — Encounter: Payer: Self-pay | Admitting: Family Medicine

## 2017-04-27 DIAGNOSIS — I700 Atherosclerosis of aorta: Secondary | ICD-10-CM | POA: Insufficient documentation

## 2017-04-27 LAB — BMP8+EGFR
BUN/Creatinine Ratio: 14 (ref 12–28)
BUN: 7 mg/dL — ABNORMAL LOW (ref 8–27)
CO2: 23 mmol/L (ref 20–29)
Calcium: 9.7 mg/dL (ref 8.7–10.3)
Chloride: 102 mmol/L (ref 96–106)
Creatinine, Ser: 0.51 mg/dL — ABNORMAL LOW (ref 0.57–1.00)
GFR calc Af Amer: 113 mL/min/{1.73_m2} (ref >59)
GFR calc non Af Amer: 98 mL/min/{1.73_m2} (ref >59)
Glucose: 100 mg/dL — ABNORMAL HIGH (ref 65–99)
Potassium: 5.1 mmol/L (ref 3.5–5.2)
Sodium: 142 mmol/L (ref 134–144)

## 2017-04-27 LAB — CBC WITH DIFFERENTIAL/PLATELET
Basophils Absolute: 0.1 10*3/uL (ref 0.0–0.2)
Basos: 1 %
EOS (ABSOLUTE): 0.6 10*3/uL — ABNORMAL HIGH (ref 0.0–0.4)
Eos: 7 %
Hematocrit: 40.3 % (ref 34.0–46.6)
Hemoglobin: 13.1 g/dL (ref 11.1–15.9)
Immature Grans (Abs): 0.1 10*3/uL (ref 0.0–0.1)
Immature Granulocytes: 1 %
Lymphocytes Absolute: 2.6 10*3/uL (ref 0.7–3.1)
Lymphs: 33 %
MCH: 29 pg (ref 26.6–33.0)
MCHC: 32.5 g/dL (ref 31.5–35.7)
MCV: 89 fL (ref 79–97)
Monocytes Absolute: 0.9 10*3/uL (ref 0.1–0.9)
Monocytes: 11 %
Neutrophils Absolute: 3.8 10*3/uL (ref 1.4–7.0)
Neutrophils: 47 %
Platelets: 314 10*3/uL (ref 150–379)
RBC: 4.51 x10E6/uL (ref 3.77–5.28)
RDW: 15.7 % — ABNORMAL HIGH (ref 12.3–15.4)
WBC: 7.9 10*3/uL (ref 3.4–10.8)

## 2017-04-27 LAB — HEPATIC FUNCTION PANEL
ALT: 20 IU/L (ref 0–32)
AST: 34 IU/L (ref 0–40)
Albumin: 4.1 g/dL (ref 3.6–4.8)
Alkaline Phosphatase: 60 IU/L (ref 39–117)
Bilirubin Total: <0.2 mg/dL (ref 0.0–1.2)
Bilirubin, Direct: 0.11 mg/dL (ref 0.00–0.40)
Total Protein: 7.2 g/dL (ref 6.0–8.5)

## 2017-04-27 LAB — LIPID PANEL
Chol/HDL Ratio: 2.9 ratio (ref 0.0–4.4)
Cholesterol, Total: 89 mg/dL — ABNORMAL LOW (ref 100–199)
HDL: 31 mg/dL — ABNORMAL LOW (ref >39)
LDL Calculated: 31 mg/dL (ref 0–99)
Triglycerides: 137 mg/dL (ref 0–149)
VLDL Cholesterol Cal: 27 mg/dL (ref 5–40)

## 2017-04-27 LAB — THYROID PANEL WITH TSH
Free Thyroxine Index: 2.9 (ref 1.2–4.9)
T3 Uptake Ratio: 23 % — ABNORMAL LOW (ref 24–39)
T4, Total: 12.5 ug/dL — ABNORMAL HIGH (ref 4.5–12.0)
TSH: 2.27 u[IU]/mL (ref 0.450–4.500)

## 2017-04-27 LAB — VITAMIN D 25 HYDROXY (VIT D DEFICIENCY, FRACTURES): Vit D, 25-Hydroxy: 91.2 ng/mL (ref 30.0–100.0)

## 2017-04-28 ENCOUNTER — Other Ambulatory Visit: Payer: Self-pay | Admitting: Family Medicine

## 2017-05-03 ENCOUNTER — Other Ambulatory Visit: Payer: Self-pay | Admitting: Family Medicine

## 2017-05-03 ENCOUNTER — Telehealth: Payer: Self-pay | Admitting: Family Medicine

## 2017-05-04 NOTE — Telephone Encounter (Signed)
NA

## 2017-05-16 NOTE — Telephone Encounter (Signed)
Refer to lab result,

## 2017-05-17 ENCOUNTER — Other Ambulatory Visit: Payer: Self-pay | Admitting: Family Medicine

## 2017-05-17 ENCOUNTER — Other Ambulatory Visit: Payer: Self-pay | Admitting: Neurology

## 2017-05-17 ENCOUNTER — Ambulatory Visit (INDEPENDENT_AMBULATORY_CARE_PROVIDER_SITE_OTHER): Payer: Medicare Other | Admitting: *Deleted

## 2017-05-17 DIAGNOSIS — I495 Sick sinus syndrome: Secondary | ICD-10-CM | POA: Diagnosis not present

## 2017-05-18 NOTE — Progress Notes (Signed)
Remote pacemaker transmission.   

## 2017-05-21 ENCOUNTER — Encounter: Payer: Self-pay | Admitting: Cardiology

## 2017-06-05 LAB — CUP PACEART REMOTE DEVICE CHECK
Battery Remaining Longevity: 93 mo
Battery Remaining Percentage: 73 %
Battery Voltage: 2.92 V
Brady Statistic AP VP Percent: 1 %
Brady Statistic AP VS Percent: 1 %
Brady Statistic AS VP Percent: 1 %
Brady Statistic AS VS Percent: 99 %
Brady Statistic RA Percent Paced: 1 %
Brady Statistic RV Percent Paced: 1 %
Date Time Interrogation Session: 20190328174207
Implantable Lead Implant Date: 20100630
Implantable Lead Implant Date: 20100630
Implantable Lead Location: 753859
Implantable Lead Location: 753860
Implantable Pulse Generator Implant Date: 20100630
Lead Channel Impedance Value: 390 Ohm
Lead Channel Impedance Value: 460 Ohm
Lead Channel Pacing Threshold Amplitude: 1 V
Lead Channel Pacing Threshold Amplitude: 1 V
Lead Channel Pacing Threshold Pulse Width: 0.4 ms
Lead Channel Pacing Threshold Pulse Width: 0.8 ms
Lead Channel Sensing Intrinsic Amplitude: 4.7 mV
Lead Channel Sensing Intrinsic Amplitude: 5 mV
Lead Channel Setting Pacing Amplitude: 2 V
Lead Channel Setting Pacing Amplitude: 2.5 V
Lead Channel Setting Pacing Pulse Width: 0.8 ms
Lead Channel Setting Sensing Sensitivity: 1.5 mV
Pulse Gen Model: 2110
Pulse Gen Serial Number: 2285953

## 2017-06-07 ENCOUNTER — Other Ambulatory Visit: Payer: Self-pay | Admitting: Family Medicine

## 2017-06-12 ENCOUNTER — Ambulatory Visit: Payer: Medicare Other | Admitting: *Deleted

## 2017-06-13 ENCOUNTER — Encounter: Payer: Self-pay | Admitting: *Deleted

## 2017-06-13 ENCOUNTER — Ambulatory Visit (INDEPENDENT_AMBULATORY_CARE_PROVIDER_SITE_OTHER): Payer: Medicare Other | Admitting: *Deleted

## 2017-06-13 VITALS — BP 126/82 | HR 74 | Ht 64.0 in | Wt 174.0 lb

## 2017-06-13 DIAGNOSIS — Z0000 Encounter for general adult medical examination without abnormal findings: Secondary | ICD-10-CM | POA: Diagnosis not present

## 2017-06-13 NOTE — Progress Notes (Addendum)
Subjective:   [name] is a 72 y.o. female who presents for a subsequent Medicare Annual Wellness Visit. 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 They have one son, one daughter and four grandchildren. [place] and are able to walk in the area safely. She has difficulty with carrying on a conversation. Her husband would like to be able to but she doesn't talk back with him.Her speech has improved since having speech therapy though.   Review of Systems    Health is better than last year.   Neuro:Had a stroke about 4 years ago. Has made a lot of progress with walking and talking. Right arm paralysis and drags right leg.  Cardiac Risk Factors include: advanced age (>46men, >15 women);diabetes mellitus;dyslipidemia;hypertension;sedentary lifestyle;obesity (BMI >30kg/m2);Other (see comment)(personal hx of CVA)  Urology: Has some trouble with incontinence  Psych: Has some anxiety/stress    Objective:    Today's Vitals   06/27/17 1517  BP: 126/82  Pulse: 74  Weight: 174 lb (78.9 kg)  Height: 5\' 4"  (1.626 m)   Body mass index is 29.87 kg/m.  Advanced Directives 06/13/2017 03/24/2013 07/04/2012 02/06/2012 11/21/2011  Does Patient Have a Medical Advance Directive? Yes Patient does not have advance directive Patient has advance directive, copy not in chart Patient has advance directive, copy not in chart Patient has advance directive, copy not in chart  Type of Advance Directive Com[place];Living will - Living will [place];Living will Living will  Does patient want to make changes to medical advance directive? No - Patient declined - - - -  Copy of [place] in Chart? No - copy requested - - - Copy requested from family  Pre-existing out of facility DNR order (yellow form or pink MOST form) - - No No -    Current Medications (verified) Outpatient Encounter Medications  as of 06/13/2017  Medication Sig  . acetaminophen (TYLENOL) 500 MG tablet Take 500 mg by mouth every 6 (six) hours as needed (pain).   . Ascorbic Acid (VITAMIN C PO) Take 1 tablet by mouth daily.  . Calcium Carbonate-Vitamin D (CALCIUM-VITAMIN D) 500-200 MG-UNIT per tablet Take 1 tablet by mouth daily.  . cephALEXin (KEFLEX) 500 MG capsule Take 1 capsule by mouth daily.  . cholecalciferol (VITAMIN D) 1000 UNITS tablet Take 2,000 Units by mouth daily.   . citalopram (CELEXA) 20 MG tablet TAKE 1 TABLET(20 MG) BY MOUTH TWICE DAILY  . cyclobenzaprine (FLEXERIL) 5 MG tablet Take 1 tablet (5 mg total) by mouth 3 (three) times daily as needed for muscle spasms.  . fluticasone (FLONASE) 50 MCG/ACT nasal spray SHAKE LIQUID AND USE 2 SPRAYS IN EACH NOSTRIL DAILY  . folic acid (FOLVITE) 1 MG tablet TAKE 1 TABLET(1 MG) BY MOUTH DAILY  . glimepiride (AMARYL) 2 MG tablet TAKE 1 TABLET(2 MG) BY MOUTH DAILY BEFORE BREAKFAST  . glimepiride (AMARYL) 2 MG tablet TAKE 1 TABLET(2 MG) BY MOUTH DAILY BEFORE BREAKFAST  . glucose blood (ONE TOUCH ULTRA TEST) test strip TEST BLOOD SUGAR ONCE A DAY  . levETIRAcetam (KEPPRA) 500 MG tablet Take 1 tablet (500 mg total) by mouth 2 (two) times daily.  Marland Kitchen levothyroxine (SYNTHROID, LEVOTHROID) 88 MCG tablet TAKE 1 TABLET BY MOUTH EVERY DAY BEFORE BREAKFAST  . loperamide (IMODIUM) 2 MG capsule Take by mouth as needed for diarrhea or loose stools (Take as directed). Reported on 05/17/2015  . methotrexate (RHEUMATREX)  2.5 MG tablet Take 4 tablets (10 mg total) by mouth once a week. Caution:Chemotherapy. Protect from light.  . metoprolol tartrate (LOPRESSOR) 50 MG tablet TAKE 1 TABLET(50 MG) BY MOUTH TWICE DAILY  . mirtazapine (REMERON) 15 MG tablet TAKE 1 TABLET(15 MG) BY MOUTH AT BEDTIME  . mirtazapine (REMERON) 15 MG tablet TAKE 1 TABLET(15 MG) BY MOUTH AT BEDTIME  . Multiple Vitamins-Minerals (EYE VITAMINS PO) Take 2 tablets by mouth daily. AREDS formula  . NYSTATIN powder APPLY  TWICE DAILY UNDERNEATH BREASTS  . omeprazole (PRILOSEC) 40 MG capsule TAKE 1 CAPSULE BY MOUTH EVERY DAY  . potassium chloride SA (K-DUR,KLOR-CON) 20 MEQ tablet TAKE 2 TABLETS BY MOUTH TWICE DAILY  . Probiotic Product (PROBIOTIC PO) Take 1 capsule by mouth daily.  . traMADol (ULTRAM) 50 MG tablet Take 1/2 tab by mouth four times daily as needed for severe pain  . XARELTO 20 MG TABS tablet TAKE 1 TABLET BY MOUTH EVERY DAY  . [DISCONTINUED] dicyclomine (BENTYL) 10 MG capsule TAKE 1 CAPSULE BY MOUTH FOUR TIMES DAILY BEFORE MEALS AND AT BEDTIME  . [DISCONTINUED] metFORMIN (GLUCOPHAGE-XR) 500 MG 24 hr tablet TAKE 2 TABLETS BY MOUTH EVERY DAY WITH BREAKFAST  . [DISCONTINUED] simvastatin (ZOCOR) 40 MG tablet TAKE 1 TABLET BY MOUTH EVERY DAY   No facility-administered encounter medications on file as of 06/13/2017.     Allergies (verified) Latex; Caudal tray [lidocaine-epinephrine]; Ciprofloxacin hcl; Codeine; Diovan [valsartan]; Doxycycline; Esomeprazole magnesium; Levofloxacin; Myrbetriq [mirabegron]; Neomycin-bacitracin zn-polymyx; Penicillins; Sulfa drugs cross reactors; Verapamil; and Vimovo [naproxen-esomeprazole]   History: Past Medical History:  Diagnosis Date  . Anemia   . Bronchial spasms   . Chronic anxiety   . Chronic kidney disease   . Complication of anesthesia    " I shake real bad "  . Diverticulosis   . Elevated blood sugar   . Esophageal stricture   . Family history of anesthesia complication    Daughter also shakes while waking up  . GERD (gastroesophageal reflux disease)   . H/O hiatal hernia   . H/O scarlet fever   . History of bladder repair surgery   . Hx of vaginal hysterectomy   . Hypertension   . Hypothyroidism   . IBS (irritable bowel syndrome)    vs diarrhea vs abd. fullness   . Impaired glucose tolerance   . Neuromuscular disorder ([place])    periferal neuropathy  . Pacemaker    St. Jude  . Paroxysmal atrial fibrillation (H[facility])    a. failed flecainide,  tikosyn, amio;  b. 01/2012 s/p RFCA.  . Psoriasis   . Psoriasis   . Rectocele, female   . Stroke ([place])   . Tachy-brady syndrome ([place])    a. 08/19/2008 s/p PPM: SJM 2110 Accent  . Uterine prolapse   . Vaginal prolapse 1998   Past Surgical History:  Procedure Laterality Date  . ATRIAL FIBRILLATION ABLATION  02/06/12   PVI by Dr Rayann Heman  . ATRIAL FIBRILLATION ABLATION  07/04/2012   repeat PVI by Dr Rayann Heman  . ATRIAL FIBRILLATION ABLATION N/A 02/06/2012   Procedure: ATRIAL FIBRILLATION ABLATION;  Surgeon: Thompson Grayer, MD;  Location[facility] CATH LAB;  Service: Cardiovascular;  Laterality: N/A;  . ATRIAL FIBRILLATION ABLATION N/A 07/04/2012   Procedure: ATRIAL FIBRILLATION ABLATION;  Surgeon: Thompson Grayer, MD;  Location: [facility] CATH LAB;  Service: Cardiovascular;  Laterality: N/A;  . BLADDER SUSPENSION    . INSERT / REPLACE / REMOVE PACEMAKER     SJM  . LEFT HEART CATHETERIZATION WITH  CORONARY ANGIOGRAM N/A 03/24/2013   Procedure: LEFT HEART CATHETERIZATION WITH CORONARY ANGIOGRAM;  Surgeon: Blane Ohara, MD;  Location: [facility] CATH LAB;  Service: Cardiovascular;  Laterality: N/A;  . RECTOCELE REPAIR    . TEE WITHOUT CARDIOVERSION  02/06/2012   Procedure: TRANSESOPHAGEAL ECHOCARDIOGRAM (TEE);  Surgeon: Thayer Headings, MD;  Location: [place];  Service: Cardiovascular;  Laterality: N/A;  . TEE WITHOUT CARDIOVERSION N/A 07/04/2012   Procedure: TRANSESOPHAGEAL ECHOCARDIOGRAM (TEE);  Surgeon: Lelon Perla, MD;  Location: [facility] ENDOSCOPY;  Service: Cardiovascular;  Laterality: N/A;  . TRANSTHORACIC ECHOCARDIOGRAM  2008  . VAGINAL HYSTERECTOMY     prolapse    Family History  Problem Relation Age of Onset  . Diabetes Mother   . Heart disease Mother   . Heart failure Mother   . Macular degeneration Mother   . Heart disease Father   . Breast cancer Sister   . Ovarian cancer Sister   . Uterine cancer Paternal Aunt   . Crohn's disease Other        neice  . Diabetes Maternal Grandmother   .  Diabetes Sister   . Heart disease Brother   . Heart disease Brother   . Macular degeneration Brother   . Colon cancer Neg Hx   . Stomach cancer Neg Hx    Social History   Socioeconomic History  . Marital status: Married    Spouse name: [name]"  . Number of children: 2  . Years of education: 14 yrs  . Highest education level: Associate degree: academic program  Occupational History  . 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  Social Needs  . Financial resource strain: Not hard at all  . Food insecurity:    Worry: Never true    Inability: Never true  . Transportation needs:    Medical: No    Non-medical: No  Tobacco Use  . Smoking status: Never Smoker  . Smokeless tobacco: Never Used  Substance and Sexual Activity  . Alcohol use: No  . Drug use: No  . Sexual activity: Yes  Lifestyle  . Physical activity:    Days per week: 0 days    Minutes per session: 0 min  . Stress: To some extent  Relationships  . Social connections:    Talks on phone: More than three times a week    Gets together: More than three times a week    Attends religious service: Never    Active member of club or organization: No    Attends meetings of clubs or organizations: Never    Relationship status: Married  Other Topics Concern  . Not on file  Social History Narrative   Lives in [place].      Left-handed   Caffeine use: drinks coffee/tea: 2 cups coffee in the morning, drinks 1 glass tea per day     Tobacco Counseling Counseling given: Not Answered   Clinical Intake:  Pre-visit preparation completed: No  Pain : 0-10 Pain Location: Back Pain Onset: More than a month ago Pain Frequency: Constant     Nutritional Status: BMI of 19-24  Normal Diabetes: Yes CBG done?: No Did pt. bring in CBG monitor from home?: No  How often do you need to have someone help you when you read instructions, pamphlets, or other written materials from your doctor or  pharmacy?: 5 - Always(due to vision-macular degeneration) What is the last grade level you completed in school?: Asssociates Degree  Interpreter Needed?: No  Information entered by :: Chong Sicilian, RN   Activities of Daily Living In your present state of health, do you have any difficulty performing the following activities: 06/13/2017  Hearing? N  Vision? Y  Comment maculat degeneration. Has eye exams twice a year  Difficulty concentrating or making decisions? Y  Comment Long term memory is good but she has difficulty forming short term memories  Walking or climbing stairs? Y  Comment uses a cane. Has weakness in her right leg due to CVA  Dressing or bathing? Y  Comment has some help with bathing. Has a shower chair.  Doing errands, shopping? Y  Comment husband does housework and Engineer, drilling and eating ? Y  Comment husband prepares meals  Using the Toilet? Y  In the past six months, have you accidently leaked urine? Y  Do you have problems with loss of bowel control? N  Managing your Medications? Y  Comment husband manages medications  Managing your Finances? Y  Housekeeping or managing your Housekeeping? Y  Some recent data might be hidden     Immunizations and Health Maintenance Immunization History  Administered Date(s) Administered  . Influenza Whole 11/21/2011  . Influenza, High Dose Seasonal PF 11/25/2015, 11/21/2016  . Influenza,inj,Quad PF,6+ Mos 12/16/2012, 12/02/2014  . Influenza,inj,quad, With Preservative 12/04/2013  . Pneumococcal Conjugate-13 12/16/2012  . Pneumococcal Polysaccharide-23 06/07/2016   There are no preventive care reminders to display for this patient.  Patient Care Team: Chipper Herb, MD as PCP - General (Family Medicine) Evans Lance, MD (Cardiology) Thompson Grayer, MD (Cardiology) Sable Feil, MD (Gastroenterology) Michael Boston, MD as Consulting Physician (General Surgery) Rosalin Hawking, MD as Consulting  Physician (Neurology)  Indicate any recent Medical Services you may have received from other than [facility] providers in the past year (date may be approximate).     Assessment:   This is a routine wellness examination for [name].  Hearing/Vision screen No exam data present  Dietary issues and exercise activities discussed:  Diet Appetite has changed. She used to eat homecooked meals but now she prefers, cereal, sweets, ice cream, etc  Current Exercise Habits: The patient does not participate in regular exercise at present  Goals    . Exercise 150 min/wk Moderate Activity      Depression Screen PHQ 2/9 Scores 06/13/2017 04/26/2017 08/09/2016 06/07/2016 03/31/2016 12/23/2015 11/25/2015  PHQ - 2 Score 5 0 1 2 1  0 0  PHQ- 9 Score 14 - - 6 - - -    Fall Risk Fall Risk  06/13/2017 04/26/2017 08/09/2016 06/07/2016 03/31/2016  Falls in the past year? Yes No No Yes No  Comment [place] out of the bed when the weather radio alarm went off - - - -  Number falls in past yr: 1 - - 1 -  Injury with Fall? No - - No -  Risk for fall due to : History of fall(s);Impaired mobility;Impaired balance/gait;Impaired vision;Mental status change - - History of fall(s);Impaired balance/gait;Impaired mobility -  Follow up Falls prevention discussed - - Falls prevention discussed -    Is the patient's home free of loose throw rugs in walkways, pet beds, electrical cords, etc?   yes      Grab bars in the bathroom? yes      Handrails on the stairs?   yes      Adequate lighting?   yes   Cognitive Function: MMSE - Mini Mental State Exam 06/08/2016  Not completed: Unable to complete  Remembered 1 out of 3 words Knew the month and year Oriented to place     Screening Tests Health Maintenance  Topic Date Due  . OPHTHALMOLOGY EXAM  07/21/2017  . INFLUENZA VACCINE  09/20/2017  . HEMOGLOBIN A1C  10/27/2017  . FOOT EXAM  12/21/2017  . URINE MICROALBUMIN  12/21/2017  . MAMMOGRAM  11/22/2018  . TETANUS/TDAP  03/23/2020    . COLONOSCOPY  08/10/2021  . DEXA SCAN  Completed  . Hepatitis C Screening  Completed  . PNA vac Low Risk Adult  Completed   [place] 11/21/2016   Cancer Screenings: Lung: Low Dose CT Chest recommended if Age 35-80 years, 30 pack-year currently smoking OR have quit w/in 15years. Patient does not qualify. Breast: Up to date on Mammogram? Yes   Up to date of Bone Density/Dexa? Yes Colorectal: Up to date 08/11/11  Additional Screening Hepatitis C Screening: Completed     Plan:  Request mammogram report from [facility] [place] [place]. States that it was doneo n 11/21/16. Move carefully to avoid falls. Increase activity level as tolerated. Aim for 150 minutes a week.  Keep f/u with PCP and specialists  I have personally reviewed and noted the following in the patient's chart:   . Medical and social history . Use of alcohol, tobacco or illicit drugs  . Current medications and supplements . Functional ability and status . Nutritional status . Physical activity . Advanced directives . List of other physicians . Hospitalizations, surgeries, and ER visits in previous 12 months . Vitals . Screenings to include cognitive, depression, and falls . Referrals and appointments  In addition, I have reviewed and discussed with patient certain preventive protocols, quality metrics, and best practice recommendations. A written personalized care plan for preventive services as well as general preventive health recommendations were provided to patient.     Chong Sicilian, RN  06/27/2017   I have reviewed and agree with the above AWV documentation.   Arrie Senate MD

## 2017-06-13 NOTE — Patient Instructions (Signed)
  [name] , Thank you for taking time to come for your Medicare Wellness Visit. I appreciate your ongoing commitment to your health goals. Please review the following plan we discussed and let me know if I can assist you in the future.   These are the goals we discussed: Goals    . Exercise 150 min/wk Moderate Activity       This is a list of the screening recommended for you and due dates:  Health Maintenance  Topic Date Due  . Eye exam for diabetics  07/21/2017  . Flu Shot  09/20/2017  . Hemoglobin A1C  10/27/2017  . Complete foot exam   12/21/2017  . Urine Protein Check  12/21/2017  . Mammogram  11/22/2018  . Tetanus Vaccine  03/23/2020  . Colon Cancer Screening  08/10/2021  . DEXA scan (bone density measurement)  Completed  .  Hepatitis C: One time screening is recommended by Center for Disease Control  (CDC) for  adults born from [birth year] through 1963.   Completed  . Pneumonia vaccines  Completed

## 2017-06-18 ENCOUNTER — Other Ambulatory Visit: Payer: Self-pay | Admitting: Family Medicine

## 2017-06-25 ENCOUNTER — Other Ambulatory Visit: Payer: Self-pay | Admitting: Family Medicine

## 2017-06-26 ENCOUNTER — Other Ambulatory Visit: Payer: Self-pay | Admitting: Family Medicine

## 2017-06-26 ENCOUNTER — Encounter: Payer: Self-pay | Admitting: *Deleted

## 2017-07-03 ENCOUNTER — Other Ambulatory Visit: Payer: Self-pay | Admitting: Family Medicine

## 2017-07-06 ENCOUNTER — Telehealth: Payer: Self-pay | Admitting: Family Medicine

## 2017-07-06 NOTE — Telephone Encounter (Signed)
Call Dr. gross and make him aware of the situation and if pain gets worse or more severe or she develops any nausea and vomiting she should go to the emergency room immediately

## 2017-07-06 NOTE — Telephone Encounter (Signed)
Pt husband states that the hernia is getting larger - she is having the back pain lower - right side. Some constipation. What can they do -  Has seen Dr Johney Maine in the past   She has appt this month with DR Jeffie Pollock also - FYI.

## 2017-07-09 NOTE — Telephone Encounter (Signed)
Husband aware.

## 2017-07-12 ENCOUNTER — Telehealth: Payer: Self-pay | Admitting: Neurology

## 2017-07-12 NOTE — Telephone Encounter (Signed)
Lovena Le, Would you mind rescheduling patient to Jessica's schedule please. Please let them know there is a new stroke-dedicated provider in the office and I would like Wynnie to see her. Janett Billow has openings that very day or they can switch to another day. Please block the appointment time and let me know when she is rescheduled thanks

## 2017-07-13 ENCOUNTER — Telehealth: Payer: Self-pay | Admitting: Neurology

## 2017-07-13 NOTE — Telephone Encounter (Signed)
Called patient and LVM for patient to call us back regarding her 5/30 appointment with Dr. Jaynee Eagles. Patient needs to be switched to NP Jessica's schedule.

## 2017-07-17 ENCOUNTER — Other Ambulatory Visit: Payer: Self-pay | Admitting: Family Medicine

## 2017-07-17 ENCOUNTER — Other Ambulatory Visit: Payer: Self-pay

## 2017-07-17 MED ORDER — CITALOPRAM HYDROBROMIDE 20 MG PO TABS: ORAL_TABLET | ORAL | 0 refills | Status: DC

## 2017-07-19 ENCOUNTER — Ambulatory Visit: Payer: Medicare Other | Admitting: Neurology

## 2017-07-19 ENCOUNTER — Encounter: Payer: Self-pay | Admitting: Adult Health

## 2017-07-19 ENCOUNTER — Ambulatory Visit (INDEPENDENT_AMBULATORY_CARE_PROVIDER_SITE_OTHER): Payer: Medicare Other | Admitting: Adult Health

## 2017-07-19 VITALS — BP 146/77 | HR 75 | Ht 64.0 in | Wt 177.2 lb

## 2017-07-19 DIAGNOSIS — F0631 Mood disorder due to known physiological condition with depressive features: Secondary | ICD-10-CM

## 2017-07-19 DIAGNOSIS — I69398 Other sequelae of cerebral infarction: Secondary | ICD-10-CM

## 2017-07-19 DIAGNOSIS — I480 Paroxysmal atrial fibrillation: Secondary | ICD-10-CM | POA: Diagnosis not present

## 2017-07-19 DIAGNOSIS — Z8673 Personal history of transient ischemic attack (TIA), and cerebral infarction without residual deficits: Secondary | ICD-10-CM

## 2017-07-19 DIAGNOSIS — E785 Hyperlipidemia, unspecified: Secondary | ICD-10-CM | POA: Diagnosis not present

## 2017-07-19 DIAGNOSIS — I10 Essential (primary) hypertension: Secondary | ICD-10-CM | POA: Diagnosis not present

## 2017-07-19 DIAGNOSIS — R569 Unspecified convulsions: Secondary | ICD-10-CM

## 2017-07-19 DIAGNOSIS — H538 Other visual disturbances: Secondary | ICD-10-CM | POA: Diagnosis not present

## 2017-07-19 DIAGNOSIS — E119 Type 2 diabetes mellitus without complications: Secondary | ICD-10-CM | POA: Diagnosis not present

## 2017-07-19 MED ORDER — LEVETIRACETAM 500 MG PO TABS: 500.0000 mg | ORAL_TABLET | Freq: Two times a day (BID) | ORAL | 4 refills | Status: DC

## 2017-07-19 NOTE — Patient Instructions (Signed)
Continue Xarelto (rivaroxaban) daily  and zocor  for secondary stroke prevention  Consider hernia repair to help with increased pain  Continue to follow up with PCP regarding cholesterol, blood pressure and blood pressure management   Continue keppra at current dose for seizure control - call us if starring episodes become more frequent or continue and we can consider increasing keppra dose at that time  Continue to monitor blood pressure at home  Maintain strict control of hypertension with blood pressure goal below 130/90, diabetes with hemoglobin A1c goal below 6.5% and cholesterol with LDL cholesterol (bad cholesterol) goal below 70 mg/dL. I also advised the patient to eat a healthy diet with plenty of whole grains, cereals, fruits and vegetables, exercise regularly and maintain ideal body weight.  Followup in the future with me in 6 months or call earlier if needed         Thank you for coming to see [place] at [facility]. I hope we have been able to provide you high quality care today.  You may receive a patient satisfaction survey over the next few weeks. We would appreciate your feedback and comments so that we may continue to improve ourselves and the health of our patients.

## 2017-07-19 NOTE — Progress Notes (Signed)
[facility]    Provider:  Dr Jaynee Eagles Referring Provider: Chipper Herb, MD Primary Care Physician:  Chipper Herb, MD  CC: Stroke and seizure follow up   HPI: [name] is a 72 year old female with PMH of HTN, HLD, DM, PAF, tachy brady syndrome, s/p permanent pacemaker insertion, and history of stroke (2015). Patient suffered from stroke during atrial fibrillation ablation. Patient had residual right sided weakness and aphasia.  During rehab post stroke, she was diagnosed with seizures. Seizures confirmed on EEG and patient placed on Keppra. Problems with memory loss.   07/19/17 update: Patient returns today for one-year follow-up and is accompanied by her husband.  Patient continues to have expressive aphasia but per her husband this is improving.  She continues to have right-sided hemiparesis.  Per husband she has had a few episodes of staring out where she has a blank stare and does not respond.  This is only happened recently and approximately 3 times.  Denies any other seizure like activity.  Continues to take Keppra 500 mg twice daily.  Patient currently has large ventral hernia that needs to be repaired but patient continues to push off surgery due to fear of not fixing it and making it worse.  Patient is currently being treated for UTI and will be on Keflex, per husband, until July and at that time they will revisit possible surgical procedure.  Continues to take Xarelto with some bruising but no bleeding.  Continues to take Zocor with out side effects of myalgias.  Husband seems to be concerned about patient not having desire to leave house or to be active as he is is eager to leave house but is unable to leave her.  Depression continues but has not worsened since last visit and patient is compliant with Celexa.  Husband seems distraught by this situation of being a caregiver for his wife.  As macular degeneration is worsening, patient has increased blurriness compared  to last visit.  Recommended psychiatry or therapist counseling for her husband as well as for wife but both of them are refusing at this time.  Patient continues to be compliant on all other medications and denies new or worsening stroke/TIA symptoms.  07/19/2016 visit: She has macular degeneration and vision changes since the stroke and they saw a physician and tried glasses. In the last year vision is worsening. She can't see as well. Last year she couldn't pass the eye exam. Her glasses did not help. She sees blurry all the time not worse in the morning or worse at night, slowly worsening over the last year, no pain on eye movement, continuous. No ptosis. No other new weakness. Just blurry vision. If she closes one eye the It is the left eye that is blurry and worsening. Her macular degeneration is worsening. The blurriness does not improve with closing one eye. Recommend an MRi of the braina nd further workup but they want to see ophthalmology first and let me know how they want to proceed. No seizures. She continues to be depressed and less social, but she denies significant changes in mood from last year. She had depression even beofre the stroke, she is a home body and husband wants to get out. She declines any more PT. Her speech is improving.    Review of Systems: Patient complains of symptoms per HPI as well as the following symptoms: Loss of vision, cough, wheezing, shortness of breath, constipation, restless leg, daytime sleepiness, incontinence of bladder, frequency  of urination, walking difficulty, seizure, speech difficulty, behavior problems, confusion, depression, and nervous/anxious. Pertinent negatives per HPI. All others negative..  Social History   Socioeconomic History  . Marital status: Married    Spouse name: [name]"  . Number of children: 2  . Years of education: 14 yrs  . Highest education level: Associate degree: academic program  Occupational History  . 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  Social Needs  . Financial resource strain: Not hard at all  . Food insecurity:    Worry: Never true    Inability: Never true  . Transportation needs:    Medical: No    Non-medical: No  Tobacco Use  . Smoking status: Never Smoker  . Smokeless tobacco: Never Used  Substance and Sexual Activity  . Alcohol use: No  . Drug use: No  . Sexual activity: Yes  Lifestyle  . Physical activity:    Days per week: 0 days    Minutes per session: 0 min  . Stress: To some extent  Relationships  . Social connections:    Talks on phone: More than three times a week    Gets together: More than three times a week    Attends religious service: Never    Active member of club or organization: No    Attends meetings of clubs or organizations: Never    Relationship status: Married  . Intimate partner violence:    Fear of current or ex partner: No    Emotionally abused: No    Physically abused: No    Forced sexual activity: No  Other Topics Concern  . Not on file  Social History Narrative   Lives in [place].      Left-handed   Caffeine use: drinks coffee/tea: 2 cups coffee in the morning, drinks 1 glass tea per day     Family History  Problem Relation Age of Onset  . Diabetes Mother   . Heart disease Mother   . Heart failure Mother   . Macular degeneration Mother   . Heart disease Father   . Breast cancer Sister   . Ovarian cancer Sister   . Uterine cancer Paternal Aunt   . Crohn's disease Other        [place]  . Diabetes Maternal Grandmother   . Diabetes Sister   . Heart disease Brother   . Heart disease Brother   . Macular degeneration Brother   . Colon cancer Neg Hx   . Stomach cancer Neg Hx     Past Medical History:  Diagnosis Date  . Anemia   . Bronchial spasms   . Chronic anxiety   . Chronic kidney disease   . Complication of anesthesia    " I shake real bad "  . Diverticulosis   . Elevated blood sugar   . Esophageal  stricture   . Family history of anesthesia complication    Daughter also shakes while waking up  . GERD (gastroesophageal reflux disease)   . H/O hiatal hernia   . H/O scarlet fever   . History of bladder repair surgery   . Hx of vaginal hysterectomy   . Hypertension   . Hypothyroidism   . IBS (irritable bowel syndrome)    vs diarrhea vs abd. fullness   . Impaired glucose tolerance   . Neuromuscular disorder ([place])    periferal neuropathy  . Pacemaker    St. Jude  . Paroxysmal atrial fibrillation (HCC)  a. failed flecainide, tikosyn, amio;  b. 01/2012 s/p RFCA.  . Psoriasis   . Psoriasis   . Rectocele, female   . Seizures ([place])   . Stroke ([place])   . Tachy-brady syndrome ([place])    a. 08/19/2008 s/p PPM: SJM 2110 [employer]ent  . Uterine prolapse   . Vaginal prolapse 1998    Past Surgical History:  Procedure Laterality Date  . ATRIAL FIBRILLATION ABLATION  02/06/12   PVI by Dr Rayann Heman  . ATRIAL FIBRILLATION ABLATION  07/04/2012   repeat PVI by Dr Rayann Heman  . ATRIAL FIBRILLATION ABLATION N/A 02/06/2012   Procedure: ATRIAL FIBRILLATION ABLATION;  Surgeon: Thompson Grayer, MD;  Location[facility] CATH LAB;  Service: Cardiovascular;  Laterality: N/A;  . ATRIAL FIBRILLATION ABLATION N/A 07/04/2012   Procedure: ATRIAL FIBRILLATION ABLATION;  Surgeon: Thompson Grayer, MD;  Location[facility] CATH LAB;  Service: Cardiovascular;  Laterality: N/A;  . BLADDER SUSPENSION    . INSERT / REPLACE / REMOVE PACEMAKER     SJM  . LEFT HEART CATHETERIZATION WITH CORONARY ANGIOGRAM N/A 03/24/2013   Procedure: LEFT HEART CATHETERIZATION WITH CORONARY ANGIOGRAM;  Surgeon: Blane Ohara, MD;  Location: [facility] CATH LAB;  Service: Cardiovascular;  Laterality: N/A;  . RECTOCELE REPAIR    . TEE WITHOUT CARDIOVERSION  02/06/2012   Procedure: TRANSESOPHAGEAL ECHOCARDIOGRAM (TEE);  Surgeon: Thayer Headings, MD;  Location: [facility];  Service: Cardiovascular;  Laterality: N/A;  . TEE WITHOUT CARDIOVERSION N/A 07/04/2012   Procedure:  TRANSESOPHAGEAL ECHOCARDIOGRAM (TEE);  Surgeon: Lelon Perla, MD;  Location: [facility] ENDOSCOPY;  Service: Cardiovascular;  Laterality: N/A;  . TRANSTHORACIC ECHOCARDIOGRAM  2008  . VAGINAL HYSTERECTOMY     prolapse     Current Outpatient Medications  Medication Sig Dispense Refill  . acetaminophen (TYLENOL) 500 MG tablet Take 500 mg by mouth every 6 (six) hours as needed (pain).     . Ascorbic Acid (VITAMIN C PO) Take 1 tablet by mouth daily.    . Calcium Carbonate-Vitamin D (CALCIUM-VITAMIN D) 500-200 MG-UNIT per tablet Take 1 tablet by mouth daily.    . cephALEXin (KEFLEX) 500 MG capsule Take 1 capsule by mouth daily.  6  . cholecalciferol (VITAMIN D) 1000 UNITS tablet Take 2,000 Units by mouth daily.     . citalopram (CELEXA) 20 MG tablet TAKE 1 TABLET(20 MG) BY MOUTH TWICE DAILY 180 tablet 0  . cyclobenzaprine (FLEXERIL) 5 MG tablet Take 1 tablet (5 mg total) by mouth 3 (three) times daily as needed for muscle spasms. 30 tablet 0  . dicyclomine (BENTYL) 10 MG capsule TAKE 1 CAPSULE BY MOUTH FOUR TIMES DAILY BEFORE MEALS AND AT BEDTIME 120 capsule 0  . fluticasone (FLONASE) 50 MCG/ACT nasal spray SHAKE LIQUID AND USE 2 SPRAYS IN EACH NOSTRIL DAILY 48 g 1  . folic acid (FOLVITE) 1 MG tablet TAKE 1 TABLET(1 MG) BY MOUTH DAILY 90 tablet 0  . glimepiride (AMARYL) 2 MG tablet TAKE 1 TABLET(2 MG) BY MOUTH DAILY BEFORE BREAKFAST 90 tablet 0  . glimepiride (AMARYL) 2 MG tablet TAKE 1 TABLET(2 MG) BY MOUTH DAILY BEFORE BREAKFAST 90 tablet 0  . glucose blood (ONE TOUCH ULTRA TEST) test strip TEST BLOOD SUGAR ONCE A DAY 100 each 3  . levETIRAcetam (KEPPRA) 500 MG tablet Take 1 tablet (500 mg total) by mouth 2 (two) times daily. 180 tablet 7  . levothyroxine (SYNTHROID, LEVOTHROID) 88 MCG tablet TAKE 1 TABLET BY MOUTH EVERY DAY BEFORE BREAKFAST 90 tablet 1  . loperamide (IMODIUM) 2 MG  capsule Take by mouth as needed for diarrhea or loose stools (Take as directed). Reported on 05/17/2015    . metFORMIN  (GLUCOPHAGE-XR) 500 MG 24 hr tablet TAKE 2 TABLETS BY MOUTH EVERY DAY WITH BREAKFAST 180 tablet 0  . methotrexate (RHEUMATREX) 2.5 MG tablet Take 4 tablets (10 mg total) by mouth once a week. Caution:Chemotherapy. Protect from light. 16 tablet 1  . metoprolol tartrate (LOPRESSOR) 50 MG tablet TAKE 1 TABLET(50 MG) BY MOUTH TWICE DAILY 180 tablet 3  . mirtazapine (REMERON) 15 MG tablet TAKE 1 TABLET(15 MG) BY MOUTH AT BEDTIME 90 tablet 0  . mirtazapine (REMERON) 15 MG tablet TAKE 1 TABLET(15 MG) BY MOUTH AT BEDTIME 90 tablet 0  . Multiple Vitamins-Minerals (EYE VITAMINS PO) Take 2 tablets by mouth daily. AREDS formula    . NYSTATIN powder APPLY TWICE DAILY UNDERNEATH BREASTS 60 g 0  . omeprazole (PRILOSEC) 40 MG capsule TAKE 1 CAPSULE BY MOUTH EVERY DAY 90 capsule 0  . potassium chloride SA (K-DUR,KLOR-CON) 20 MEQ tablet TAKE 2 TABLETS BY MOUTH TWICE DAILY 120 tablet 0  . Probiotic Product (PROBIOTIC PO) Take 1 capsule by mouth daily.    . simvastatin (ZOCOR) 40 MG tablet TAKE 1 TABLET BY MOUTH EVERY DAY 90 tablet 1  . traMADol (ULTRAM) 50 MG tablet Take 1/2 tab by mouth four times daily as needed for severe pain    . XARELTO 20 MG TABS tablet TAKE 1 TABLET BY MOUTH EVERY DAY 30 tablet 0   No current facility-administered medications for this visit.     Allergies as of 07/19/2017 - Review Complete 07/19/2017  Allergen Reaction Noted  . Latex Rash 06/27/2012  . Caudal tray [lidocaine-epinephrine]  06/22/2010  . Ciprofloxacin hcl Nausea Only 06/03/2012  . Codeine Nausea Only   . Diovan [valsartan] Itching 06/22/2010  . Doxycycline Other (See Comments)   . Esomeprazole magnesium  06/22/2010  . Levofloxacin  06/22/2010  . Myrbetriq [mirabegron] Diarrhea 12/01/2013  . Neomycin-bacitracin zn-polymyx    . Penicillins    . Sulfa drugs cross reactors Other (See Comments) 06/22/2010  . Verapamil  06/22/2010  . Vimovo [naproxen-esomeprazole]  06/22/2010    Vitals: BP (!) 146/77 (BP Location:  Left Arm, Patient Position: Sitting, Cuff Size: Small)   Pulse 75   Ht 5\' 4"  (1.626 m)   Wt 177 lb 3.2 oz (80.4 kg)   BMI 30.42 kg/m  Last Weight:  Wt Readings from Last 1 Encounters:  07/19/17 177 lb 3.2 oz (80.4 kg)   Last Height:   Ht Readings from Last 1 Encounters:  07/19/17 5\' 4"  (1.626 m)    Speech:  Expressive aphasia, comprehension appears to be intact Cognition:  The patient is oriented to person, date, day, month, year Cranial Nerves:  physiologic anisocoria left pupil 42mm right 66mm but both round, and equally reactive to light. Visual fields are full to finger confrontation. Extraocular movements are intact. Trigeminal sensation is intact and the muscles of mastication are normal. Right lower facial droop. The palate elevates in the midline. Hearing intact. Voice is normal. Shoulder shrug is normal. The tongue has normal motion without fasciculations.   Coordination:  No dysmetria on the left, cannot perform on the right due to weakness  Gait: Patient has hobbling gait and ambulating with cane  Motor Observation:  No asymmetry, no atrophy, and no involuntary movements noted.  Tone:  Normal muscle tone.   Posture:  Posture is normal. normal erect   Strength: right arm and hip flexion  4/5 and right DF 4+/5 (wearing sling on right arm to assist with weakness)  Otherwise strength is V/V in the left upper and left lower limbs.    Sensation: Decreased sensation in right upper and lower extremity   Reflex Exam: 2+ bilaterally and symmetrically   Toes:  Right upgoing  Clonus:  Clonus is absent.   Assessment/Plan: 72 year old patient who presents with her husband here for follow up of stroke with residual right-sided hemiparesis and aphasia. By account she appears to be doing very well after her stroke as far as physical therapy, occupational therapy and speech therapy are concern. However patient appears to have significant  depression which is concerning for husband and son, discussed depression at length with family, this is very common after stroke. Patient is on Celexa and follows closely with Dr. Redge Gainer. They have been evaluated by primary care to ensure there are no medical causes for patient's fatigue and depression including thyroid testing, ensuring there is no anemia, or any infection or any other metabolic, toxic etiology. Highly recommended following up with therapy and psychiatry. The family appears very earnest in their concern and caring for patient, the effects of stroke on a family can be devastating. Discussed this at length with family.   -Continue Keppra 500 mg twice a day - refill sent in to [facility] and [facility] for secondary stroke prevention -Follow-up with PCP regarding HLD, HTN and DM -Highly recommended both patient and husband participate in therapy and/or see a psychiatrist as effects from a stroke can be equally difficult on both the patient and the caregiver -Highly recommended that patient becomes active even if this means getting out of the house and riding in the car someone -Maintain strict control of hypertension with blood pressure goal below 130/90, diabetes with hemoglobin A1c goal below 6.5% and cholesterol with LDL cholesterol (bad cholesterol) goal below 70 mg/dL. I also advised the patient to eat a healthy diet with plenty of whole grains, cereals, fruits and vegetables, exercise regularly and maintain ideal body weight.  Follow-up in 6 months or call earlier if needed  Venancio Poisson, [facility] [address] [place], [place] [address]  Phone [phone] Fax [phone]

## 2017-07-19 NOTE — Progress Notes (Signed)
Please discuss with patient's husband and schedule with psychiatry for more aggressive management of her ongoing depression following her stroke

## 2017-07-22 NOTE — Progress Notes (Signed)
Personally  participated in, made any corrections needed, and agree with history, physical, neuro exam,assessment and plan as stated above.    Antonia Ahern, MD [facility] 

## 2017-07-25 ENCOUNTER — Other Ambulatory Visit: Payer: Self-pay | Admitting: Internal Medicine

## 2017-07-25 ENCOUNTER — Other Ambulatory Visit: Payer: Self-pay | Admitting: Family Medicine

## 2017-07-26 ENCOUNTER — Telehealth: Payer: Self-pay | Admitting: Family Medicine

## 2017-07-26 NOTE — Telephone Encounter (Signed)
Faxed [phone]

## 2017-08-09 ENCOUNTER — Other Ambulatory Visit: Payer: Self-pay | Admitting: Family Medicine

## 2017-08-09 ENCOUNTER — Other Ambulatory Visit: Payer: Self-pay | Admitting: Neurology

## 2017-08-10 ENCOUNTER — Ambulatory Visit (INDEPENDENT_AMBULATORY_CARE_PROVIDER_SITE_OTHER): Payer: Medicare Other | Admitting: Urology

## 2017-08-10 DIAGNOSIS — N3020 Other chronic cystitis without hematuria: Secondary | ICD-10-CM

## 2017-08-10 DIAGNOSIS — N3941 Urge incontinence: Secondary | ICD-10-CM | POA: Diagnosis not present

## 2017-08-11 ENCOUNTER — Other Ambulatory Visit: Payer: Self-pay | Admitting: Internal Medicine

## 2017-08-11 ENCOUNTER — Other Ambulatory Visit: Payer: Self-pay | Admitting: Family Medicine

## 2017-08-14 ENCOUNTER — Other Ambulatory Visit: Payer: Self-pay | Admitting: Family Medicine

## 2017-08-14 ENCOUNTER — Other Ambulatory Visit: Payer: Self-pay | Admitting: Neurology

## 2017-08-16 ENCOUNTER — Ambulatory Visit (INDEPENDENT_AMBULATORY_CARE_PROVIDER_SITE_OTHER): Payer: Medicare Other | Admitting: *Deleted

## 2017-08-16 ENCOUNTER — Encounter: Payer: Self-pay | Admitting: Cardiology

## 2017-08-16 DIAGNOSIS — I495 Sick sinus syndrome: Secondary | ICD-10-CM

## 2017-08-16 NOTE — Progress Notes (Signed)
Remote pacemaker transmission.   

## 2017-08-20 ENCOUNTER — Ambulatory Visit (INDEPENDENT_AMBULATORY_CARE_PROVIDER_SITE_OTHER): Payer: Medicare Other | Admitting: Internal Medicine

## 2017-08-20 ENCOUNTER — Encounter: Payer: Self-pay | Admitting: Internal Medicine

## 2017-08-20 VITALS — BP 140/80 | HR 75 | Ht 64.0 in | Wt 177.0 lb

## 2017-08-20 DIAGNOSIS — Z950 Presence of cardiac pacemaker: Secondary | ICD-10-CM

## 2017-08-20 DIAGNOSIS — I495 Sick sinus syndrome: Secondary | ICD-10-CM

## 2017-08-20 DIAGNOSIS — I480 Paroxysmal atrial fibrillation: Secondary | ICD-10-CM | POA: Diagnosis not present

## 2017-08-20 DIAGNOSIS — I6349 Cerebral infarction due to embolism of other cerebral artery: Secondary | ICD-10-CM

## 2017-08-20 MED ORDER — FUROSEMIDE 20 MG PO TABS: 20.0000 mg | ORAL_TABLET | Freq: Every day | ORAL | 3 refills | Status: DC

## 2017-08-20 NOTE — Progress Notes (Signed)
HPI Mrs. Cea returns today for evaluation of sinus node dysfunction, s/p PPM insertion, PAF, s/p ablation complicated by stroke. The patient has a very dense expressive aphasia and right HP. She is considering undergoing hernia replacement surgery. No chest pain. She is sedentary but notes dyspnea with any exertion.  Allergies  Allergen Reactions  . Latex Rash  . Caudal Tray [Lidocaine-Epinephrine]     Edema    . Ciprofloxacin Hcl Nausea Only  . Codeine Nausea Only  . Diovan [Valsartan] Itching  . Doxycycline Other (See Comments)    Might have caused heart to race per pt - not sure  . Esomeprazole Magnesium     Headache    . Levofloxacin     Insomnia    . Myrbetriq [Mirabegron] Diarrhea  . Neomycin-Bacitracin Zn-Polymyx     rash  . Penicillins     hives  . Sulfa Drugs Cross Reactors Other (See Comments)    Pt not sure what reaction was  . Verapamil     Edema   . Vimovo [Naproxen-Esomeprazole]     Upset stomach      Current Outpatient Medications  Medication Sig Dispense Refill  . acetaminophen (TYLENOL) 500 MG tablet Take 500 mg by mouth every 6 (six) hours as needed (pain).     . Ascorbic Acid (VITAMIN C PO) Take 1 tablet by mouth daily.    . Calcium Carbonate-Vitamin D (CALCIUM-VITAMIN D) 500-200 MG-UNIT per tablet Take 1 tablet by mouth daily.    . cephALEXin (KEFLEX) 500 MG capsule Take 1 capsule by mouth daily.  6  . cholecalciferol (VITAMIN D) 1000 UNITS tablet Take 2,000 Units by mouth daily.     . citalopram (CELEXA) 20 MG tablet TAKE 1 TABLET(20 MG) BY MOUTH TWICE DAILY 180 tablet 0  . cyclobenzaprine (FLEXERIL) 5 MG tablet Take 1 tablet (5 mg total) by mouth 3 (three) times daily as needed for muscle spasms. 30 tablet 0  . dicyclomine (BENTYL) 10 MG capsule TAKE 1 CAPSULE BY MOUTH FOUR TIMES DAILY BEFORE MEALS AND AT BEDTIME 120 capsule 1  . fluticasone (FLONASE) 50 MCG/ACT nasal spray SHAKE LIQUID AND USE 2 SPRAYS IN EACH NOSTRIL DAILY 48 g 1  .  folic acid (FOLVITE) 1 MG tablet TAKE 1 TABLET(1 MG) BY MOUTH DAILY 90 tablet 0  . glimepiride (AMARYL) 2 MG tablet TAKE 1 TABLET(2 MG) BY MOUTH DAILY BEFORE BREAKFAST 90 tablet 0  . glimepiride (AMARYL) 2 MG tablet TAKE 1 TABLET(2 MG) BY MOUTH DAILY BEFORE BREAKFAST 90 tablet 0  . glucose blood (ONE TOUCH ULTRA TEST) test strip TEST BLOOD SUGAR ONCE A DAY 100 each 3  . levETIRAcetam (KEPPRA) 500 MG tablet Take 1 tablet (500 mg total) by mouth 2 (two) times daily. 180 tablet 4  . levothyroxine (SYNTHROID, LEVOTHROID) 88 MCG tablet TAKE 1 TABLET BY MOUTH EVERY DAY BEFORE BREAKFAST 90 tablet 2  . loperamide (IMODIUM) 2 MG capsule Take by mouth as needed for diarrhea or loose stools (Take as directed). Reported on 05/17/2015    . metFORMIN (GLUCOPHAGE-XR) 500 MG 24 hr tablet TAKE 2 TABLETS BY MOUTH EVERY DAY WITH BREAKFAST 180 tablet 0  . methotrexate (RHEUMATREX) 2.5 MG tablet Take 4 tablets (10 mg total) by mouth once a week. Caution:Chemotherapy. Protect from light. 16 tablet 1  . metoprolol tartrate (LOPRESSOR) 50 MG tablet Take 1 tablet (50 mg total) by mouth 2 (two) times daily. Please keep upcoming appt in July for future refills. Thank  you 180 tablet 0  . mirtazapine (REMERON) 15 MG tablet TAKE 1 TABLET(15 MG) BY MOUTH AT BEDTIME 90 tablet 0  . Multiple Vitamins-Minerals (EYE VITAMINS PO) Take 2 tablets by mouth daily. AREDS formula    . NYSTATIN powder APPLY TWICE DAILY UNDERNEATH BREASTS 60 g 0  . omeprazole (PRILOSEC) 40 MG capsule TAKE 1 CAPSULE BY MOUTH EVERY DAY 90 capsule 0  . potassium chloride SA (K-DUR,KLOR-CON) 20 MEQ tablet TAKE 2 TABLETS BY MOUTH TWICE DAILY 120 tablet 1  . Probiotic Product (PROBIOTIC PO) Take 1 capsule by mouth daily.    . simvastatin (ZOCOR) 40 MG tablet TAKE 1 TABLET BY MOUTH EVERY DAY 90 tablet 1  . traMADol (ULTRAM) 50 MG tablet Take 1/2 tab by mouth four times daily as needed for severe pain    . XARELTO 20 MG TABS tablet TAKE 1 TABLET BY MOUTH EVERY DAY 90  tablet 0  . furosemide (LASIX) 20 MG tablet Take 1 tablet (20 mg total) by mouth daily. 90 tablet 3   No current facility-administered medications for this visit.      Past Medical History:  Diagnosis Date  . Anemia   . Bronchial spasms   . Chronic anxiety   . Chronic kidney disease   . Complication of anesthesia    " I shake real bad "  . Diverticulosis   . Elevated blood sugar   . Esophageal stricture   . Family history of anesthesia complication    Daughter also shakes while waking up  . GERD (gastroesophageal reflux disease)   . H/O hiatal hernia   . H/O scarlet fever   . History of bladder repair surgery   . Hx of vaginal hysterectomy   . Hypertension   . Hypothyroidism   . IBS (irritable bowel syndrome)    vs diarrhea vs abd. fullness   . Impaired glucose tolerance   . Neuromuscular disorder ([place])    periferal neuropathy  . Pacemaker    St. Jude  . Paroxysmal atrial fibrillation ([facility])    a. failed flecainide, tikosyn, amio;  b. 01/2012 s/p RFCA.  . Psoriasis   . Psoriasis   . Rectocele, female   . Seizures ([place])   . Stroke ([place])   . Tachy-brady syndrome ([place])    a. 08/19/2008 s/p PPM: SJM 2110 Accent  . Uterine prolapse   . Vaginal prolapse 1998    ROS:   All systems reviewed and negative except as noted in the HPI.   Past Surgical History:  Procedure Laterality Date  . ATRIAL FIBRILLATION ABLATION  02/06/12   PVI by Dr Rayann Heman  . ATRIAL FIBRILLATION ABLATION  07/04/2012   repeat PVI by Dr Rayann Heman  . ATRIAL FIBRILLATION ABLATION N/A 02/06/2012   Procedure: ATRIAL FIBRILLATION ABLATION;  Surgeon: Thompson Grayer, MD;  Location[facility] CATH LAB;  Service: Cardiovascular;  Laterality: N/A;  . ATRIAL FIBRILLATION ABLATION N/A 07/04/2012   Procedure: ATRIAL FIBRILLATION ABLATION;  Surgeon: Thompson Grayer, MD;  Location: [facility] CATH LAB;  Service: Cardiovascular;  Laterality: N/A;  . BLADDER SUSPENSION    . INSERT / REPLACE / REMOVE PACEMAKER     SJM  . LEFT HEART  CATHETERIZATION WITH CORONARY ANGIOGRAM N/A 03/24/2013   Procedure: LEFT HEART CATHETERIZATION WITH CORONARY ANGIOGRAM;  Surgeon: Blane Ohara, MD;  Location: [facility] CATH LAB;  Service: Cardiovascular;  Laterality: N/A;  . RECTOCELE REPAIR    . TEE WITHOUT CARDIOVERSION  02/06/2012   Procedure: TRANSESOPHAGEAL ECHOCARDIOGRAM (TEE);  Surgeon: Wonda Cheng  Nahser, MD;  Location: [facility] ENDOSCOPY;  Service: Cardiovascular;  Laterality: N/A;  . TEE WITHOUT CARDIOVERSION N/A 07/04/2012   Procedure: TRANSESOPHAGEAL ECHOCARDIOGRAM (TEE);  Surgeon: Lelon Perla, MD;  Location: [facility] ENDOSCOPY;  Service: Cardiovascular;  Laterality: N/A;  . TRANSTHORACIC ECHOCARDIOGRAM  2008  . VAGINAL HYSTERECTOMY     prolapse      Family History  Problem Relation Age of Onset  . Diabetes Mother   . Heart disease Mother   . Heart failure Mother   . Macular degeneration Mother   . Heart disease Father   . Breast cancer Sister   . Ovarian cancer Sister   . Uterine cancer Paternal Aunt   . Crohn's disease Other        neice  . Diabetes Maternal Grandmother   . Diabetes Sister   . Heart disease Brother   . Heart disease Brother   . Macular degeneration Brother   . Colon cancer Neg Hx   . Stomach cancer Neg Hx      Social History   Socioeconomic History  . Marital status: Married    Spouse name: [name]"  . Number of children: 2  . Years of education: 14 yrs  . Highest education level: Associate degree: academic program  Occupational History  . 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  Social Needs  . Financial resource strain: Not hard at all  . Food insecurity:    Worry: Never true    Inability: Never true  . Transportation needs:    Medical: No    Non-medical: No  Tobacco Use  . Smoking status: Never Smoker  . Smokeless tobacco: Never Used  Substance and Sexual Activity  . Alcohol use: No  . Drug use: No  . Sexual activity: Yes  Lifestyle  . Physical activity:    Days  per week: 0 days    Minutes per session: 0 min  . Stress: To some extent  Relationships  . Social connections:    Talks on phone: More than three times a week    Gets together: More than three times a week    Attends religious service: Never    Active member of club or organization: No    Attends meetings of clubs or organizations: Never    Relationship status: Married  . Intimate partner violence:    Fear of current or ex partner: No    Emotionally abused: No    Physically abused: No    Forced sexual activity: No  Other Topics Concern  . Not on file  Social History Narrative   Lives in [place].      Left-handed   Caffeine use: drinks coffee/tea: 2 cups coffee in the morning, drinks 1 glass tea per day      BP 140/80   Pulse 75   Ht 5\' 4"  (1.626 m)   Wt 177 lb (80.3 kg)   SpO2 96%   BMI 30.38 kg/m   Physical Exam:  stable appearing 72 yo woman, NAD HEENT: Unremarkable Neck:  6 cm  JVD, no thyromegally Lymphatics:  No adenopathy Back:  No CVA tenderness Lungs:  Clear with minimal basilar rales HEART:  Regular rate rhythm, no murmurs, no rubs, no clicks Abd:  soft, positive bowel sounds, no organomegally, no rebound, no guarding Ext:  2 plus pulses, no edema, no cyanosis, no clubbing Skin:  No rashes no nodules Neuro:  CN II through XII intact, motor grossly intact  EKG - nsr  with atrial pacing  DEVICE  Normal device function.  See PaceArt for details.   Assess/Plan: 1. PAF - she is maintaining NSR very nicely. No change in her meds. 2. Sinus node dysfunction - she is s/p PPM insertion. 3. PPM - her St. Jude DDD PM is working normally. Will follow.  Mikle Bosworth.D.

## 2017-08-20 NOTE — Patient Instructions (Addendum)
Medication Instructions:  Your physician has recommended you make the following change in your medication:  1.  Start taking furosemide (lasix) 20 mg one tablet by mouth daily.  Labwork: Get BMP in 2 weeks  Testing/Procedures: None ordered.  Follow-Up: Your physician wants you to follow-up in: one year with Dr. Lovena Le.   You will receive a reminder letter in the mail two months in advance. If you don't receive a letter, please call our office to schedule the follow-up appointment.  Remote monitoring is used to monitor your Pacemaker from home. This monitoring reduces the number of office visits required to check your device to one time per year. It allows [place] to keep an eye on the functioning of your device to ensure it is working properly. You are scheduled for a device check from home on 11/15/2017. You may send your transmission at any time that day. If you have a wireless device, the transmission will be sent automatically. After your physician reviews your transmission, you will receive a postcard with your next transmission date.  Any Other Special Instructions Will Be Listed Below (If Applicable).  If you need a refill on your cardiac medications before your next appointment, please call your pharmacy.   Furosemide tablets What is this medicine? FUROSEMIDE (fyoor [place] se mide) is a diuretic. It helps you make more urine and to lose salt and excess water from your body. This medicine is used to treat high blood pressure, and edema or swelling from heart, kidney, or liver disease. This medicine may be used for other purposes; ask your health care provider or pharmacist if you have questions. COMMON BRAND NAME(S): Active-Medicated Specimen Kit, Delone, Diuscreen, Lasix, RX Specimen Collection Kit, Specimen Collection Kit, URINX Medicated Specimen Collection What should I tell my health care provider before I take this medicine? They need to know if you have any of these  conditions: -abnormal blood electrolytes -diarrhea or vomiting -gout -heart disease -kidney disease, small amounts of urine, or difficulty passing urine -liver disease -thyroid disease -an unusual or allergic reaction to furosemide, sulfa drugs, other medicines, foods, dyes, or preservatives -pregnant or trying to get pregnant -breast-feeding How should I use this medicine? Take this medicine by mouth with a glass of water. Follow the directions on the prescription label. You may take this medicine with or without food. If it upsets your stomach, take it with food or milk. Do not take your medicine more often than directed. Remember that you will need to pass more urine after taking this medicine. Do not take your medicine at a time of day that will cause you problems. Do not take at bedtime. Talk to your pediatrician regarding the use of this medicine in children. While this drug may be prescribed for selected conditions, precautions do apply. Overdosage: If you think you have taken too much of this medicine contact a poison control center or emergency room at once. NOTE: This medicine is only for you. Do not share this medicine with others. What if I miss a dose? If you miss a dose, take it as soon as you can. If it is almost time for your next dose, take only that dose. Do not take double or extra doses. What may interact with this medicine? -aspirin and aspirin-like medicines -certain antibiotics -chloral hydrate -cisplatin -cyclosporine -digoxin -diuretics -laxatives -lithium -medicines for blood pressure -medicines that relax muscles for surgery -methotrexate -NSAIDs, medicines for pain and inflammation like ibuprofen, naproxen, or indomethacin -phenytoin -steroid medicines like prednisone  or cortisone -sucralfate -thyroid hormones This list may not describe all possible interactions. Give your health care provider a list of all the medicines, herbs, non-prescription drugs,  or dietary supplements you use. Also tell them if you smoke, drink alcohol, or use illegal drugs. Some items may interact with your medicine. What should I watch for while using this medicine? Visit your doctor or health care professional for regular checks on your progress. Check your blood pressure regularly. Ask your doctor or health care professional what your blood pressure should be, and when you should contact him or her. If you are a diabetic, check your blood sugar as directed. You may need to be on a special diet while taking this medicine. Check with your doctor. Also, ask how many glasses of fluid you need to drink a day. You must not get dehydrated. You may get drowsy or dizzy. Do not drive, use machinery, or do anything that needs mental alertness until you know how this drug affects you. Do not stand or sit up quickly, especially if you are an older patient. This reduces the risk of dizzy or fainting spells. Alcohol can make you more drowsy and dizzy. Avoid alcoholic drinks. This medicine can make you more sensitive to the sun. Keep out of the sun. If you cannot avoid being in the sun, wear protective clothing and use sunscreen. Do not use sun lamps or tanning beds/booths. What side effects may I notice from receiving this medicine? Side effects that you should report to your doctor or health care professional as soon as possible: -blood in urine or stools -dry mouth -fever or chills -hearing loss or ringing in the ears -irregular heartbeat -muscle pain or weakness, cramps -skin rash -stomach upset, pain, or nausea -tingling or numbness in the hands or feet -unusually weak or tired -vomiting or diarrhea -yellowing of the eyes or skin Side effects that usually do not require medical attention (report to your doctor or health care professional if they continue or are bothersome): -headache -loss of appetite -unusual bleeding or bruising This list may not describe all possible  side effects. Call your doctor for medical advice about side effects. You may report side effects to FDA at 1-800-FDA-1088. Where should I keep my medicine? Keep out of the reach of children. Store at room temperature between 15 and 30 degrees C (59 and 86 degrees F). Protect from light. Throw away any unused medicine after the expiration date. NOTE: This sheet is a summary. It may not cover all possible information. If you have questions about this medicine, talk to your doctor, pharmacist, or health care provider.  2018 Elsevier/Gold Standard (2014-04-29 13:49:50)

## 2017-08-21 ENCOUNTER — Telehealth: Payer: Self-pay | Admitting: Family Medicine

## 2017-08-21 LAB — CUP PACEART REMOTE DEVICE CHECK
Battery Remaining Longevity: 72 mo
Battery Remaining Percentage: 57 %
Battery Voltage: 2.89 V
Brady Statistic AP VP Percent: 1 %
Brady Statistic AP VS Percent: 1 %
Brady Statistic AS VP Percent: 1 %
Brady Statistic AS VS Percent: 99 %
Brady Statistic RA Percent Paced: 1 %
Brady Statistic RV Percent Paced: 1 %
Date Time Interrogation Session: 20190627143829
Implantable Lead Implant Date: 20100630
Implantable Lead Implant Date: 20100630
Implantable Lead Location: 753859
Implantable Lead Location: 753860
Implantable Pulse Generator Implant Date: 20100630
Lead Channel Impedance Value: 350 Ohm
Lead Channel Impedance Value: 450 Ohm
Lead Channel Pacing Threshold Amplitude: 1 V
Lead Channel Pacing Threshold Amplitude: 1 V
Lead Channel Pacing Threshold Pulse Width: 0.4 ms
Lead Channel Pacing Threshold Pulse Width: 0.8 ms
Lead Channel Sensing Intrinsic Amplitude: 4.5 mV
Lead Channel Sensing Intrinsic Amplitude: 4.7 mV
Lead Channel Setting Pacing Amplitude: 2 V
Lead Channel Setting Pacing Amplitude: 2.5 V
Lead Channel Setting Pacing Pulse Width: 0.8 ms
Lead Channel Setting Sensing Sensitivity: 1.5 mV
Pulse Gen Model: 2110
Pulse Gen Serial Number: 2285953

## 2017-08-21 NOTE — Telephone Encounter (Signed)
Called and informed patient's husband that all medications prescribed from this office has been sent to [facility] and [facility] has been removed from [facility] list.  Also, informed patient's husband that a prescription from a different office was sent to [facility] and he will need to contact that office to have it resent

## 2017-08-24 ENCOUNTER — Telehealth: Payer: Self-pay | Admitting: Internal Medicine

## 2017-08-24 NOTE — Telephone Encounter (Signed)
New Message   Pt's husband calling, states he informed Dr. Lovena Le that his pcp is in [place] [facility] and wants this lab work done with him Dr. Laurance Flatten but, Dr. Laurance Flatten states he needs the order faxed to: [phone] [facility]. Please call

## 2017-08-24 NOTE — Telephone Encounter (Signed)
Faxed lab orders as requested. Notified Pt husband via [place].

## 2017-08-24 NOTE — Addendum Note (Signed)
Addended by: Campbell Riches on: 08/24/2017 08:14 AM   Modules accepted: Orders

## 2017-08-30 ENCOUNTER — Telehealth: Payer: Self-pay | Admitting: Internal Medicine

## 2017-08-30 NOTE — Telephone Encounter (Signed)
New message    Pt c/o medication issue:  1. Name of Medication: furosemide (LASIX) 20 MG tablet  2. How are you currently taking this medication (dosage and times per day)? Take 1 tablet (20 mg total) by mouth daily.  3. Are you having a reaction (difficulty breathing--STAT)? No   4. What is your medication issue? Wants to discontinue due to frequency urination

## 2017-08-31 NOTE — Telephone Encounter (Signed)
Call returned to husband.  Advised per Dr. Lovena Le ok to take change lasix instructions to PRN for shortness of breath or leg swelling.  Educated husband on when to take lasix and progressiveness of CHF and symptoms.  Husband indicates understanding.

## 2017-09-01 ENCOUNTER — Other Ambulatory Visit: Payer: Self-pay | Admitting: Family Medicine

## 2017-09-05 LAB — CUP PACEART INCLINIC DEVICE CHECK
Battery Remaining Longevity: 100 mo
Battery Voltage: 2.9 V
Brady Statistic RA Percent Paced: 0.52 %
Brady Statistic RV Percent Paced: 0.44 %
Date Time Interrogation Session: 20190701190658
Implantable Lead Implant Date: 20100630
Implantable Lead Implant Date: 20100630
Implantable Lead Location: 753859
Implantable Lead Location: 753860
Implantable Pulse Generator Implant Date: 20100630
Lead Channel Impedance Value: 375 Ohm
Lead Channel Impedance Value: 475 Ohm
Lead Channel Pacing Threshold Amplitude: 1 V
Lead Channel Pacing Threshold Amplitude: 1 V
Lead Channel Pacing Threshold Amplitude: 1 V
Lead Channel Pacing Threshold Amplitude: 1 V
Lead Channel Pacing Threshold Pulse Width: 0.4 ms
Lead Channel Pacing Threshold Pulse Width: 0.4 ms
Lead Channel Pacing Threshold Pulse Width: 0.8 ms
Lead Channel Pacing Threshold Pulse Width: 0.8 ms
Lead Channel Sensing Intrinsic Amplitude: 5 mV
Lead Channel Sensing Intrinsic Amplitude: 6.8 mV
Lead Channel Setting Pacing Amplitude: 2 V
Lead Channel Setting Pacing Amplitude: 2.5 V
Lead Channel Setting Pacing Pulse Width: 0.8 ms
Lead Channel Setting Sensing Sensitivity: 1.5 mV
Pulse Gen Model: 2110
Pulse Gen Serial Number: 2285953

## 2017-09-10 ENCOUNTER — Other Ambulatory Visit: Payer: Self-pay | Admitting: Family Medicine

## 2017-09-10 NOTE — Telephone Encounter (Signed)
OV 09/27/17

## 2017-09-13 ENCOUNTER — Other Ambulatory Visit: Payer: Self-pay | Admitting: *Deleted

## 2017-09-13 MED ORDER — DICYCLOMINE HCL 10 MG PO CAPS: ORAL_CAPSULE | ORAL | 3 refills | Status: DC

## 2017-09-17 ENCOUNTER — Ambulatory Visit: Payer: Medicare Other | Admitting: Family Medicine

## 2017-09-20 ENCOUNTER — Telehealth: Payer: Self-pay | Admitting: Family Medicine

## 2017-09-20 NOTE — Telephone Encounter (Signed)
Offered appt for today but pt's husband declined. Pt given appt tomorrow 09/21/17 at 4 pm.

## 2017-09-20 NOTE — Telephone Encounter (Signed)
What symptoms do you have? UTI  How long have you been sick? 5 days  Have you been seen for this problem? no  If your provider decides to give you a prescription, which pharmacy would you like for it to be sent to? [facility] in [place] on [address]   Patient informed that this information will be sent to the clinical staff for review and that they should receive a follow up call.

## 2017-09-21 ENCOUNTER — Encounter: Payer: Self-pay | Admitting: Family Medicine

## 2017-09-21 ENCOUNTER — Ambulatory Visit (INDEPENDENT_AMBULATORY_CARE_PROVIDER_SITE_OTHER): Payer: Medicare Other | Admitting: Family Medicine

## 2017-09-21 VITALS — BP 138/71 | HR 73 | Temp 98.8°F | Ht 64.0 in | Wt 177.0 lb

## 2017-09-21 DIAGNOSIS — Z7901 Long term (current) use of anticoagulants: Secondary | ICD-10-CM | POA: Diagnosis not present

## 2017-09-21 DIAGNOSIS — R399 Unspecified symptoms and signs involving the genitourinary system: Secondary | ICD-10-CM | POA: Diagnosis not present

## 2017-09-21 DIAGNOSIS — R300 Dysuria: Secondary | ICD-10-CM

## 2017-09-21 LAB — URINALYSIS, COMPLETE
Bilirubin, UA: NEGATIVE
Ketones, UA: NEGATIVE
Nitrite, UA: NEGATIVE
Protein, UA: NEGATIVE
RBC, UA: NEGATIVE
Specific Gravity, UA: 1.025 (ref 1.005–1.030)
Urobilinogen, Ur: 0.2 mg/dL (ref 0.2–1.0)
pH, UA: 5.5 (ref 5.0–7.5)

## 2017-09-21 LAB — MICROSCOPIC EXAMINATION
Epithelial Cells (non renal): >10 /hpf — AB (ref 0–10)
RBC, UA: NONE SEEN /hpf (ref 0–2)
Renal Epithel, UA: NONE SEEN /hpf

## 2017-09-21 MED ORDER — PHENAZOPYRIDINE HCL 200 MG PO TABS: 200.0000 mg | ORAL_TABLET | Freq: Three times a day (TID) | ORAL | 0 refills | Status: DC | PRN

## 2017-09-21 NOTE — Progress Notes (Signed)
Subjective: CC: UTI PCP: Chipper Herb, MD [name] is a 72 y.o. female presenting to clinic today for:  1. Urinary symptoms Patient reports a couple day h/o some discomfort with urination and a urinary odor .  Denies urinary frequency, urgency, hematuria, fevers, chills, abdominal pain, nausea, vomiting, back pain, vaginal discharge.  Patient has used prescribed Keflex from her Urologist for symptoms.  Patient reports a h/o frequent or recurrent UTIs.    2. Chronic anticoagulation Her husband reports that she has an upcoming dental procedure on October 23, 2017.  She is currently on Xarelto for anticoagulation.  History of CVA and atrial fibrillation.    ROS: Per HPI  Allergies  Allergen Reactions  . Latex Rash  . Caudal Tray [Lidocaine-Epinephrine]     Edema    . Ciprofloxacin Hcl Nausea Only  . Codeine Nausea Only  . Diovan [Valsartan] Itching  . Doxycycline Other (See Comments)    Might have caused heart to race per pt - not sure  . Esomeprazole Magnesium     Headache    . Levofloxacin     Insomnia    . Myrbetriq [Mirabegron] Diarrhea  . Neomycin-Bacitracin Zn-Polymyx     rash  . Penicillins     hives  . Sulfa Drugs Cross Reactors Other (See Comments)    Pt not sure what reaction was  . Verapamil     Edema   . Vimovo [Naproxen-Esomeprazole]     Upset stomach    Past Medical History:  Diagnosis Date  . Anemia   . Bronchial spasms   . Chronic anxiety   . Chronic kidney disease   . Complication of anesthesia    " I shake real bad "  . Diverticulosis   . Elevated blood sugar   . Esophageal stricture   . Family history of anesthesia complication    Daughter also shakes while waking up  . GERD (gastroesophageal reflux disease)   . H/O hiatal hernia   . H/O scarlet fever   . History of bladder repair surgery   . Hx of vaginal hysterectomy   . Hypertension   . Hypothyroidism   . IBS (irritable bowel syndrome)    vs diarrhea vs abd.  fullness   . Impaired glucose tolerance   . Neuromuscular disorder ([place])    periferal neuropathy  . Pacemaker    St. Jude  . Paroxysmal atrial fibrillation (HCC)    a. failed flecainide, tikosyn, amio;  b. 01/2012 s/p RFCA.  . Psoriasis   . Psoriasis   . Rectocele, female   . Seizures ([place])   . Stroke ([place])   . Tachy-brady syndrome ([place])    a. 08/19/2008 s/p PPM: SJM 2110 Accent  . Uterine prolapse   . Vaginal prolapse 1998    Current Outpatient Medications:  .  acetaminophen (TYLENOL) 500 MG tablet, Take 500 mg by mouth every 6 (six) hours as needed (pain). , Disp: , Rfl:  .  Ascorbic Acid (VITAMIN C PO), Take 1 tablet by mouth daily., Disp: , Rfl:  .  Calcium Carbonate-Vitamin D (CALCIUM-VITAMIN D) 500-200 MG-UNIT per tablet, Take 1 tablet by mouth daily., Disp: , Rfl:  .  cephALEXin (KEFLEX) 500 MG capsule, Take 1 capsule by mouth daily., Disp: , Rfl: 6 .  cholecalciferol (VITAMIN D) 1000 UNITS tablet, Take 2,000 Units by mouth daily. , Disp: , Rfl:  .  citalopram (CELEXA) 20 MG tablet, TAKE 1 TABLET(20 MG) BY MOUTH TWICE DAILY, Disp: 180  tablet, Rfl: 0 .  cyclobenzaprine (FLEXERIL) 5 MG tablet, Take 1 tablet (5 mg total) by mouth 3 (three) times daily as needed for muscle spasms., Disp: 30 tablet, Rfl: 0 .  dicyclomine (BENTYL) 10 MG capsule, TAKE 1 CAPSULE BY MOUTH FOUR TIMES DAILY BEFORE MEALS AND AT BEDTIME, Disp: 360 capsule, Rfl: 3 .  fluticasone (FLONASE) 50 MCG/ACT nasal spray, SHAKE LIQUID AND USE 2 SPRAYS IN EACH NOSTRIL DAILY, Disp: 48 g, Rfl: 1 .  folic acid (FOLVITE) 1 MG tablet, TAKE 1 TABLET(1 MG) BY MOUTH DAILY, Disp: 90 tablet, Rfl: 0 .  furosemide (LASIX) 20 MG tablet, Take 1 tablet (20 mg total) by mouth daily., Disp: 90 tablet, Rfl: 3 .  glimepiride (AMARYL) 2 MG tablet, TAKE 1 TABLET(2 MG) BY MOUTH DAILY BEFORE BREAKFAST, Disp: 90 tablet, Rfl: 0 .  glimepiride (AMARYL) 2 MG tablet, TAKE 1 TABLET(2 MG) BY MOUTH DAILY BEFORE BREAKFAST, Disp: 90 tablet, Rfl: 0 .   glucose blood (ONE TOUCH ULTRA TEST) test strip, TEST BLOOD SUGAR ONCE A DAY, Disp: 100 each, Rfl: 3 .  levETIRAcetam (KEPPRA) 500 MG tablet, Take 1 tablet (500 mg total) by mouth 2 (two) times daily., Disp: 180 tablet, Rfl: 4 .  levothyroxine (SYNTHROID, LEVOTHROID) 88 MCG tablet, TAKE 1 TABLET BY MOUTH EVERY DAY BEFORE BREAKFAST, Disp: 90 tablet, Rfl: 2 .  loperamide (IMODIUM) 2 MG capsule, Take by mouth as needed for diarrhea or loose stools (Take as directed). Reported on 05/17/2015, Disp: , Rfl:  .  metFORMIN (GLUCOPHAGE-XR) 500 MG 24 hr tablet, TAKE 2 TABLETS BY MOUTH EVERY DAY WITH BREAKFAST, Disp: 180 tablet, Rfl: 0 .  methotrexate (RHEUMATREX) 2.5 MG tablet, Take 4 tablets (10 mg total) by mouth once a week. Caution:Chemotherapy. Protect from light., Disp: 16 tablet, Rfl: 1 .  metoprolol tartrate (LOPRESSOR) 50 MG tablet, Take 1 tablet (50 mg total) by mouth 2 (two) times daily. Please keep upcoming appt in July for future refills. Thank you, Disp: 180 tablet, Rfl: 0 .  mirtazapine (REMERON) 15 MG tablet, TAKE 1 TABLET(15 MG) BY MOUTH AT BEDTIME, Disp: 90 tablet, Rfl: 0 .  Multiple Vitamins-Minerals (EYE VITAMINS PO), Take 2 tablets by mouth daily. AREDS formula, Disp: , Rfl:  .  NYSTATIN powder, APPLY TWICE DAILY UNDERNEATH BREASTS, Disp: 60 g, Rfl: 0 .  omeprazole (PRILOSEC) 40 MG capsule, TAKE 1 CAPSULE BY MOUTH EVERY DAY, Disp: 90 capsule, Rfl: 0 .  potassium chloride SA (K-DUR,KLOR-CON) 20 MEQ tablet, TAKE 2 TABLETS BY MOUTH TWICE DAILY, Disp: 120 tablet, Rfl: 0 .  Probiotic Product (PROBIOTIC PO), Take 1 capsule by mouth daily., Disp: , Rfl:  .  simvastatin (ZOCOR) 40 MG tablet, TAKE 1 TABLET BY MOUTH EVERY DAY, Disp: 90 tablet, Rfl: 1 .  traMADol (ULTRAM) 50 MG tablet, Take 1/2 tab by mouth four times daily as needed for severe pain, Disp: , Rfl:  .  XARELTO 20 MG TABS tablet, TAKE 1 TABLET BY MOUTH EVERY DAY, Disp: 90 tablet, Rfl: 0 Social History   Socioeconomic History  . Marital  status: Married    Spouse name: [name]"  . Number of children: 2  . Years of education: 14 yrs  . Highest education level: Associate degree: academic program  Occupational History  . 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  Social Needs  . Financial resource strain: Not hard at all  . Food insecurity:    Worry: Never true    Inability: Never true  .  Transportation needs:    Medical: No    Non-medical: No  Tobacco Use  . Smoking status: Never Smoker  . Smokeless tobacco: Never Used  Substance and Sexual Activity  . Alcohol use: No  . Drug use: No  . Sexual activity: Yes  Lifestyle  . Physical activity:    Days per week: 0 days    Minutes per session: 0 min  . Stress: To some extent  Relationships  . Social connections:    Talks on phone: More than three times a week    Gets together: More than three times a week    Attends religious service: Never    Active member of club or organization: No    Attends meetings of clubs or organizations: Never    Relationship status: Married  . Intimate partner violence:    Fear of current or ex partner: No    Emotionally abused: No    Physically abused: No    Forced sexual activity: No  Other Topics Concern  . Not on file  Social History Narrative   Lives in [place].      Left-handed   Caffeine use: drinks coffee/tea: 2 cups coffee in the morning, drinks 1 glass tea per day    Family History  Problem Relation Age of Onset  . Diabetes Mother   . Heart disease Mother   . Heart failure Mother   . Macular degeneration Mother   . Heart disease Father   . Breast cancer Sister   . Ovarian cancer Sister   . Uterine cancer Paternal Aunt   . Crohn's disease Other        neice  . Diabetes Maternal Grandmother   . Diabetes Sister   . Heart disease Brother   . Heart disease Brother   . Macular degeneration Brother   . Colon cancer Neg Hx   . Stomach cancer Neg Hx     Objective: Office vital signs  reviewed. BP 138/71   Pulse 73   Temp 98.8 F (37.1 C) (Oral)   Ht 5\' 4"  (1.626 m)   Wt 177 lb (80.3 kg)   BMI 30.38 kg/m   Physical Examination:  General: Awake, alert, nontoxic, No acute distress GU: no suprapubic TTP Extremities: mild LE edema L>R. RUE w/ contractures in a sling.  Assessment/ Plan: 72 y.o. female   1. Dysuria Patient is afebrile nontoxic-appearing.  Vitals are within normal limits.  Urinalysis did not demonstrate evidence of infection.  She had calcium oxalate crystals on microscopy.  No blood or bacteria noted.  She is on chronic Keflex for urinary tract infection prevention by her urologist.  Will send for urine culture given symptoms though I do suspect that symptoms may be related to possible passage of urinary stones.  Will contact the results once available.  If urinary tract infection is present, may need to consider increasing Keflex to twice daily dosing versus adding Macrobid given sensitivities of her your last urine culture. - Urinalysis, Complete - Urine Culture  2. Chronic anticoagulation We discussed that given her high risk of clots and low risk procedure she can hold Xarelto the day before procedure and resume 24 hours after dental extraction as long as dental extraction goes without complication.  She will see her PCP prior to procedure.   Orders Placed This Encounter  Procedures  . Urine Culture  . Urinalysis, Complete   Meds ordered this encounter  Medications  . phenazopyridine (PYRIDIUM) 200 MG tablet  Sig: Take 1 tablet (200 mg total) by mouth 3 (three) times daily as needed for pain ((burning urination)).    Dispense:  9 tablet    Refill:  0     Pamela Pfannenstiel Windell Moulding, DO [place] [phone]

## 2017-09-21 NOTE — Patient Instructions (Signed)
I have added a urine culture to your urinalysis today and will contact you with results on Monday.  Your urine today did not demonstrate evidence of infection but did show some calcium oxalate crystals which may indicate a urinary stone.  You will need to hold the Xarelto 1 day before the dental extraction and resume 24 hours after the dental procedure as long as there are no complications.

## 2017-09-26 LAB — URINE CULTURE

## 2017-09-27 ENCOUNTER — Ambulatory Visit: Payer: Medicare Other | Admitting: Family Medicine

## 2017-10-10 ENCOUNTER — Other Ambulatory Visit: Payer: Self-pay | Admitting: Family Medicine

## 2017-10-11 ENCOUNTER — Other Ambulatory Visit: Payer: Self-pay | Admitting: Family Medicine

## 2017-10-12 ENCOUNTER — Other Ambulatory Visit: Payer: Self-pay | Admitting: Family Medicine

## 2017-10-13 NOTE — Telephone Encounter (Signed)
Ov 10/17/17

## 2017-10-17 ENCOUNTER — Encounter: Payer: Self-pay | Admitting: Family Medicine

## 2017-10-17 ENCOUNTER — Ambulatory Visit (INDEPENDENT_AMBULATORY_CARE_PROVIDER_SITE_OTHER): Payer: Medicare Other | Admitting: Family Medicine

## 2017-10-17 VITALS — BP 138/72 | HR 68 | Temp 97.7°F | Ht 64.0 in | Wt 177.0 lb

## 2017-10-17 DIAGNOSIS — I10 Essential (primary) hypertension: Secondary | ICD-10-CM | POA: Diagnosis not present

## 2017-10-17 DIAGNOSIS — R29898 Other symptoms and signs involving the musculoskeletal system: Secondary | ICD-10-CM

## 2017-10-17 DIAGNOSIS — E785 Hyperlipidemia, unspecified: Secondary | ICD-10-CM | POA: Diagnosis not present

## 2017-10-17 DIAGNOSIS — I6930 Unspecified sequelae of cerebral infarction: Secondary | ICD-10-CM | POA: Diagnosis not present

## 2017-10-17 DIAGNOSIS — E034 Atrophy of thyroid (acquired): Secondary | ICD-10-CM | POA: Diagnosis not present

## 2017-10-17 DIAGNOSIS — I480 Paroxysmal atrial fibrillation: Secondary | ICD-10-CM

## 2017-10-17 DIAGNOSIS — E559 Vitamin D deficiency, unspecified: Secondary | ICD-10-CM | POA: Diagnosis not present

## 2017-10-17 DIAGNOSIS — R6889 Other general symptoms and signs: Secondary | ICD-10-CM | POA: Diagnosis not present

## 2017-10-17 DIAGNOSIS — E119 Type 2 diabetes mellitus without complications: Secondary | ICD-10-CM

## 2017-10-17 LAB — BAYER DCA HB A1C WAIVED: HB A1C (BAYER DCA - WAIVED): 9.6 % — ABNORMAL HIGH (ref <7.0)

## 2017-10-17 NOTE — Progress Notes (Signed)
Subjective:    Patient ID: [name], female    DOB: 1945-08-13, 72 y.o.   MRN: [id number]  HPI Pt here for follow up and management of chronic medical problems which includes hypertension, hyperlipidemia and diabetes. She is taking medication regularly.  The patient comes to the visit today with her caregiver who has been an incredible husband to look after her at home.  She brings in blood sugars and blood pressures for review.  The most recent blood pressures actually look pretty good ranging from 409 811 systolic and in the 91Y to 70 diastolic.  The blood sugars however are running in the 200 range.  There is a long list of these readings and they will be scanned into her record.  The patient today is complaining of some left foot and lower leg weakness.  She is complaining of some right side abdominal pain.  She has fatigue and visual changes.  She is requesting refills on all of her medicines.  She has oral surgery planned on 3 September.  We will tell her that she can leave off the Xarelto for 1 day before that.  Restart it the following day.  Her husband comes with her to the visit.  She has seen Dr. Lovena Le recently to have a cardiac evaluation and a pacemaker check.  The patient did see the cardiologist and everything was reported good by him to her.  She does have some shortness of breath but the husband says this may be better.  She is not having any trouble with swallowing or heartburn or indigestion but does have this right sided abdominal pain and the hernia that she is waiting to get repaired is on the left.  She does not drink a lot of water.  She has not seen any blood in the stool or had any black tarry bowel movements.  She is passing her water with out problems.  Her vision issues are due to macular degeneration dry type.     Patient Active Problem List   Diagnosis Date Noted  . Aortic atherosclerosis ([place]) 04/27/2017  . Type 2 diabetes mellitus ([place]) 05/17/2015  .  Cerebrovascular accident (CVA) due to embolism of left middle cerebral artery ([place]) 11/24/2014  . Focal and partial seizures ([place]) 11/24/2014  . Depression 11/24/2014  . Cerebral infarction due to embolism of other cerebral artery ([place]) 09/03/2013  . Metabolic syndrome 78/29/5621  . High risk medication use 03/13/2013  . Fever, unspecified 06/05/2012  . Generalized abdominal pain 06/05/2012  . PAF (paroxysmal atrial fibrillation) ([place]) 02/07/2012  . Tachy-brady syndrome ([place])   . Impaired glucose tolerance   . Fatigue 12/22/2009  . SNORING 12/22/2009  . OTHER CHRONIC NONALCOHOLIC LIVER DISEASE 30/86/5784  . NONSPEC ELEVATION OF LEVELS OF TRANSAMINASE/LDH 03/09/2009  . PPM-St.Jude 09/01/2008  . BRADYCARDIA 08/13/2008  . Hypothyroidism 06/09/2008  . HTN (hypertension) 06/09/2008  . Atrial fibrillation ([place]) 06/09/2008  . PREMATURE VENTRICULAR CONTRACTIONS 06/09/2008  . IRRITABLE BOWEL SYNDROME 06/09/2008  . PSORIASIS 06/09/2008  . EDEMA 06/09/2008  . DIVERTICULOSIS, COLON, HX OF 06/09/2008   Outpatient Encounter Medications as of 10/17/2017  Medication Sig  . acetaminophen (TYLENOL) 500 MG tablet Take 500 mg by mouth every 6 (six) hours as needed (pain).   . Ascorbic Acid (VITAMIN C PO) Take 1 tablet by mouth daily.  . Calcium Carbonate-Vitamin D (CALCIUM-VITAMIN D) 500-200 MG-UNIT per tablet Take 1 tablet by mouth daily.  . cephALEXin (KEFLEX) 500 MG capsule Take 1 capsule by mouth  daily.  . cholecalciferol (VITAMIN D) 1000 UNITS tablet Take 2,000 Units by mouth daily.   . citalopram (CELEXA) 20 MG tablet TAKE 1 TABLET(20 MG) BY MOUTH TWICE DAILY  . cyclobenzaprine (FLEXERIL) 5 MG tablet Take 1 tablet (5 mg total) by mouth 3 (three) times daily as needed for muscle spasms.  Marland Kitchen dicyclomine (BENTYL) 10 MG capsule TAKE 1 CAPSULE BY MOUTH FOUR TIMES DAILY BEFORE MEALS AND AT BEDTIME  . fluticasone (FLONASE) 50 MCG/ACT nasal spray SHAKE LIQUID AND USE 2 SPRAYS IN EACH NOSTRIL DAILY  .  folic acid (FOLVITE) 1 MG tablet TAKE 1 TABLET(1 MG) BY MOUTH DAILY  . furosemide (LASIX) 20 MG tablet Take 1 tablet (20 mg total) by mouth daily.  Marland Kitchen glimepiride (AMARYL) 2 MG tablet TAKE 1 TABLET(2 MG) BY MOUTH DAILY BEFORE BREAKFAST  . glucose blood (ONE TOUCH ULTRA TEST) test strip TEST BLOOD SUGAR ONCE A DAY  . levETIRAcetam (KEPPRA) 500 MG tablet Take 1 tablet (500 mg total) by mouth 2 (two) times daily.  Marland Kitchen levothyroxine (SYNTHROID, LEVOTHROID) 88 MCG tablet TAKE 1 TABLET BY MOUTH EVERY DAY BEFORE BREAKFAST  . loperamide (IMODIUM) 2 MG capsule Take by mouth as needed for diarrhea or loose stools (Take as directed). Reported on 05/17/2015  . metFORMIN (GLUCOPHAGE-XR) 500 MG 24 hr tablet TAKE 2 TABLETS BY MOUTH EVERY DAY WITH BREAKFAST  . methotrexate (RHEUMATREX) 2.5 MG tablet Take 4 tablets (10 mg total) by mouth once a week. Caution:Chemotherapy. Protect from light.  . metoprolol tartrate (LOPRESSOR) 50 MG tablet Take 1 tablet (50 mg total) by mouth 2 (two) times daily. Please keep upcoming appt in July for future refills. Thank you  . mirtazapine (REMERON) 15 MG tablet TAKE 1 TABLET(15 MG) BY MOUTH AT BEDTIME  . Multiple Vitamins-Minerals (EYE VITAMINS PO) Take 2 tablets by mouth daily. AREDS formula  . NYSTATIN powder APPLY TWICE DAILY UNDERNEATH BREASTS  . omeprazole (PRILOSEC) 40 MG capsule TAKE 1 CAPSULE BY MOUTH EVERY DAY  . phenazopyridine (PYRIDIUM) 200 MG tablet Take 1 tablet (200 mg total) by mouth 3 (three) times daily as needed for pain ((burning urination)).  . potassium chloride SA (K-DUR,KLOR-CON) 20 MEQ tablet TAKE 2 TABLETS BY MOUTH TWICE DAILY  . Probiotic Product (PROBIOTIC PO) Take 1 capsule by mouth daily.  . simvastatin (ZOCOR) 40 MG tablet TAKE 1 TABLET BY MOUTH EVERY DAY  . traMADol (ULTRAM) 50 MG tablet Take 1/2 tab by mouth four times daily as needed for severe pain  . XARELTO 20 MG TABS tablet TAKE 1 TABLET BY MOUTH EVERY DAY  . [DISCONTINUED] glimepiride (AMARYL)  2 MG tablet TAKE 1 TABLET(2 MG) BY MOUTH DAILY BEFORE BREAKFAST   No facility-administered encounter medications on file as of 10/17/2017.      Review of Systems  Constitutional: Positive for fatigue.  HENT: Negative.   Eyes: Positive for visual disturbance (vision changes for vision straight ahead "looks like a line through her vision" ).  Respiratory: Negative.   Cardiovascular: Negative.   Gastrointestinal: Positive for abdominal pain (right side pain ).  Endocrine: Negative.   Genitourinary: Negative.   Musculoskeletal: Negative.   Skin: Negative.   Allergic/Immunologic: Negative.   Neurological: Positive for weakness (of left foot and lower leg ).  Hematological: Negative.   Psychiatric/Behavioral: Negative.        Objective:   Physical Exam  Constitutional: She is oriented to person, place, and time. She appears well-developed and well-nourished. No distress.  Patient is pleasant and alert but  has trouble expressing herself and this is been going on since the stroke.  It is frustrating for her and you can tell this when talking to her.  The husband has to do most of the talking.  HENT:  Head: Normocephalic and atraumatic.  Right Ear: External ear normal.  Left Ear: External ear normal.  Nose: Nose normal.  Mouth/Throat: Oropharynx is clear and moist.  Eyes: Pupils are equal, round, and reactive to light. Conjunctivae and EOM are normal. Right eye exhibits no discharge. Left eye exhibits no discharge. No scleral icterus.  Continue to follow-up with ophthalmologist for macular degeneration  Neck: Normal range of motion. Neck supple. No thyromegaly present.  No bruits or thyromegaly or anterior cervical adenopathy  Cardiovascular: Normal rate, regular rhythm, normal heart sounds and intact distal pulses.  No murmur heard. Heart is regular today at 60/min.  Distal pulses were present but weaker on the right than the left.  Pulmonary/Chest: Effort normal and breath sounds  normal. She has no wheezes. She has no rales.  Clear anteriorly and posteriorly  Abdominal: Soft. Bowel sounds are normal. She exhibits no mass. There is tenderness. There is guarding.  Abdominal obesity with a large slightly tender near midline abdominal hernia.  Patient has seen Dr. gross about this and once she can take care of the dental issues that she has she plans to go back and see Dr. gross to have this repaired.  Musculoskeletal: She exhibits edema and deformity. She exhibits no tenderness.  Patient has right-sided weakness and can move legs with care and support but the right arm is somewhat contractured especially the hand.  This is secondary to the stroke.  Minimal pedal edema bilaterally  Lymphadenopathy:    She has no cervical adenopathy.  Neurological: She is alert and oriented to person, place, and time. She displays abnormal reflex. A cranial nerve deficit is present. Coordination abnormal.  Patient has dysarthria but is fully alert about what is being said to her just has trouble expressing herself.  Skin: Skin is warm and dry.  Psychiatric: She has a normal mood and affect. Her behavior is normal. Judgment and thought content normal.  The patient's mood and affect are stable for her and her husband has been extremely supported and has been an incredible caregiver for her.  She is aware of this.  Nursing note and vitals reviewed.  BP (!) 142/74 (BP Location: Left Arm)   Pulse 68   Temp 97.7 F (36.5 C) (Oral)   Ht '5\' 4"'$  (1.626 m)   Wt 177 lb (80.3 kg)   BMI 30.38 kg/m         Assessment & Plan:  1. Hyperlipidemia, unspecified hyperlipidemia type -Continue current treatment pending results of lab work - CBC with Differential/Platelet - Lipid panel  2. Essential hypertension -Blood pressure today is good for this patient and no changes in treatment as home readings are all good and these will be scanned into the record. - BMP8+EGFR - CBC with  Differential/Platelet - Hepatic function panel  3. Type 2 diabetes mellitus without complication, without long-term current use of insulin (HCC) -Continue to monitor blood sugars closely and await results of hemoglobin A1c - CBC with Differential/Platelet - Bayer DCA Hb A1c Waived  4. Vitamin D deficiency -Continue vitamin D replacement pending results of lab work - CBC with Differential/Platelet - VITAMIN D 25 Hydroxy (Vit-D Deficiency, Fractures)  5. PAF (paroxysmal atrial fibrillation) ([place]) -Patient was in normal sinus rhythm today at 60/min -  CBC with Differential/Platelet  6. Hypothyroidism due to acquired atrophy of thyroid - CBC with Differential/Platelet - Thyroid Panel With TSH  7. Left leg weakness - Vitamin B12  No orders of the defined types were placed in this encounter.  Patient Instructions                       Medicare Annual Wellness Visit  [facility] and the medical providers at [facility] strive to bring you the best medical care.  In doing so we not only want to address your current medical conditions and concerns but also to detect new conditions early and prevent illness, disease and health-related problems.    Medicare offers a yearly Wellness Visit which allows our clinical staff to assess your need for preventative services including immunizations, lifestyle education, counseling to decrease risk of preventable diseases and screening for fall risk and other medical concerns.    This visit is provided free of charge (no copay) for all Medicare recipients. The clinical pharmacists at [facility] have begun to conduct these Wellness Visits which will also include a thorough review of all your medications.    As you primary medical provider recommend that you make an appointment for your Annual Wellness Visit if you have not done so already this year.  You may set up this appointment before you leave today or  you may call back ([phone]) and schedule an appointment.  Please make sure when you call that you mention that you are scheduling your Annual Wellness Visit with the clinical pharmacist so that the appointment may be made for the proper length of time.    Continue current medications. Continue good therapeutic lifestyle changes which include good diet and exercise. Fall precautions discussed with patient. If an FOBT was given today- please return it to our front desk. If you are over 24 years old - you may need Prevnar 57 or the adult Pneumonia vaccine.  **Flu shots are available--- please call and schedule a FLU-CLINIC appointment**  After your visit with [place] today you will receive a survey in the mail or online from [employer] regarding your care with [place]. Please take a moment to fill this out. Your feedback is very important to [place] as you can help [place] better understand your patient needs as well as improve your experience and satisfaction. WE CARE ABOUT YOU!!!   Follow-up with oral surgeon as planned Leave off Xarelto 24 hours before the surgery and start back on the Xarelto the next day Drink plenty of water and fluids Elevate legs for 20 minutes in the afternoon higher than the level of the heart Get some mild support hose and they should be put on both legs the first thing with arising in the morning to help control fluid retention Try the Lasix that Dr. Lovena Le gave you 20 mg and take 1/2 pill on Monday Wednesday and Friday for a couple weeks to see if this helps with fluid retention Once the oral surgery is completed, try to get back on track to see Dr. gross and take care of the abdominal hernia that appears to be getting larger. Follow-up with Dr. Lovena Le, the cardiologist as planned Follow-up with the ophthalmologist as planned  Arrie Senate MD

## 2017-10-17 NOTE — Patient Instructions (Addendum)
Medicare Annual Wellness Visit  [name] and the medical providers at [facility] strive to bring you the best medical care.  In doing so we not only want to address your current medical conditions and concerns but also to detect new conditions early and prevent illness, disease and health-related problems.    Medicare offers a yearly Wellness Visit which allows our clinical staff to assess your need for preventative services including immunizations, lifestyle education, counseling to decrease risk of preventable diseases and screening for fall risk and other medical concerns.    This visit is provided free of charge (no copay) for all Medicare recipients. The clinical pharmacists at [facility] have begun to conduct these Wellness Visits which will also include a thorough review of all your medications.    As you primary medical provider recommend that you make an appointment for your Annual Wellness Visit if you have not done so already this year.  You may set up this appointment before you leave today or you may call back ([phone]) and schedule an appointment.  Please make sure when you call that you mention that you are scheduling your Annual Wellness Visit with the clinical pharmacist so that the appointment may be made for the proper length of time.    Continue current medications. Continue good therapeutic lifestyle changes which include good diet and exercise. Fall precautions discussed with patient. If an FOBT was given today- please return it to our front desk. If you are over 70 years old - you may need Prevnar 65 or the adult Pneumonia vaccine.  **Flu shots are available--- please call and schedule a FLU-CLINIC appointment**  After your visit with [place] today you will receive a survey in the mail or online from [employer] regarding your care with [place]. Please take a moment to fill this out. Your feedback is very  important to [place] as you can help [place] better understand your patient needs as well as improve your experience and satisfaction. WE CARE ABOUT YOU!!!   Follow-up with oral surgeon as planned Leave off Xarelto 24 hours before the surgery and start back on the Xarelto the next day Drink plenty of water and fluids Elevate legs for 20 minutes in the afternoon higher than the level of the heart Get some mild support hose and they should be put on both legs the first thing with arising in the morning to help control fluid retention Try the Lasix that Dr. Lovena Le gave you 20 mg and take 1/2 pill on Monday Wednesday and Friday for a couple weeks to see if this helps with fluid retention Once the oral surgery is completed, try to get back on track to see Dr. gross and take care of the abdominal hernia that appears to be getting larger. Follow-up with Dr. Lovena Le, the cardiologist as planned Follow-up with the ophthalmologist as planned

## 2017-10-18 LAB — HEPATIC FUNCTION PANEL
ALT: 13 IU/L (ref 0–32)
AST: 28 IU/L (ref 0–40)
Albumin: 4.1 g/dL (ref 3.5–4.8)
Alkaline Phosphatase: 65 IU/L (ref 39–117)
Bilirubin Total: 0.3 mg/dL (ref 0.0–1.2)
Bilirubin, Direct: 0.14 mg/dL (ref 0.00–0.40)
Total Protein: 7.1 g/dL (ref 6.0–8.5)

## 2017-10-18 LAB — CBC WITH DIFFERENTIAL/PLATELET
Basophils Absolute: 0 10*3/uL (ref 0.0–0.2)
Basos: 1 %
EOS (ABSOLUTE): 0.4 10*3/uL (ref 0.0–0.4)
Eos: 5 %
Hematocrit: 40.8 % (ref 34.0–46.6)
Hemoglobin: 13 g/dL (ref 11.1–15.9)
Immature Grans (Abs): 0 10*3/uL (ref 0.0–0.1)
Immature Granulocytes: 0 %
Lymphocytes Absolute: 2.7 10*3/uL (ref 0.7–3.1)
Lymphs: 40 %
MCH: 29.3 pg (ref 26.6–33.0)
MCHC: 31.9 g/dL (ref 31.5–35.7)
MCV: 92 fL (ref 79–97)
Monocytes Absolute: 0.5 10*3/uL (ref 0.1–0.9)
Monocytes: 8 %
Neutrophils Absolute: 3.2 10*3/uL (ref 1.4–7.0)
Neutrophils: 46 %
Platelets: 221 10*3/uL (ref 150–450)
RBC: 4.44 x10E6/uL (ref 3.77–5.28)
RDW: 15.1 % (ref 12.3–15.4)
WBC: 6.8 10*3/uL (ref 3.4–10.8)

## 2017-10-18 LAB — THYROID PANEL WITH TSH
Free Thyroxine Index: 2.8 (ref 1.2–4.9)
T3 Uptake Ratio: 23 % — ABNORMAL LOW (ref 24–39)
T4, Total: 12.3 ug/dL — ABNORMAL HIGH (ref 4.5–12.0)
TSH: 2.18 u[IU]/mL (ref 0.450–4.500)

## 2017-10-18 LAB — BMP8+EGFR
BUN/Creatinine Ratio: 14 (ref 12–28)
BUN: 8 mg/dL (ref 8–27)
CO2: 26 mmol/L (ref 20–29)
Calcium: 9.9 mg/dL (ref 8.7–10.3)
Chloride: 101 mmol/L (ref 96–106)
Creatinine, Ser: 0.56 mg/dL — ABNORMAL LOW (ref 0.57–1.00)
GFR calc Af Amer: 109 mL/min/{1.73_m2} (ref >59)
GFR calc non Af Amer: 95 mL/min/{1.73_m2} (ref >59)
Glucose: 197 mg/dL — ABNORMAL HIGH (ref 65–99)
Potassium: 4.8 mmol/L (ref 3.5–5.2)
Sodium: 141 mmol/L (ref 134–144)

## 2017-10-18 LAB — LIPID PANEL
Chol/HDL Ratio: 3.2 ratio (ref 0.0–4.4)
Cholesterol, Total: 99 mg/dL — ABNORMAL LOW (ref 100–199)
HDL: 31 mg/dL — ABNORMAL LOW (ref >39)
LDL Calculated: 44 mg/dL (ref 0–99)
Triglycerides: 122 mg/dL (ref 0–149)
VLDL Cholesterol Cal: 24 mg/dL (ref 5–40)

## 2017-10-18 LAB — VITAMIN B12: Vitamin B-12: 658 pg/mL (ref 232–1245)

## 2017-10-18 LAB — VITAMIN D 25 HYDROXY (VIT D DEFICIENCY, FRACTURES): Vit D, 25-Hydroxy: 79 ng/mL (ref 30.0–100.0)

## 2017-10-25 ENCOUNTER — Other Ambulatory Visit: Payer: Self-pay | Admitting: Family Medicine

## 2017-11-01 ENCOUNTER — Telehealth: Payer: Self-pay | Admitting: Family Medicine

## 2017-11-01 NOTE — Telephone Encounter (Signed)
Order faxed to [facility] in [place] and a copy made and put in the mail for the patient. Husband aware

## 2017-11-01 NOTE — Telephone Encounter (Signed)
Is this a referral?

## 2017-11-05 ENCOUNTER — Other Ambulatory Visit: Payer: Self-pay | Admitting: Internal Medicine

## 2017-11-09 ENCOUNTER — Other Ambulatory Visit: Payer: Self-pay | Admitting: Family Medicine

## 2017-11-14 ENCOUNTER — Telehealth: Payer: Self-pay | Admitting: Family Medicine

## 2017-11-15 ENCOUNTER — Telehealth: Payer: Self-pay

## 2017-11-15 ENCOUNTER — Ambulatory Visit (INDEPENDENT_AMBULATORY_CARE_PROVIDER_SITE_OTHER): Payer: Medicare Other | Admitting: *Deleted

## 2017-11-15 DIAGNOSIS — I495 Sick sinus syndrome: Secondary | ICD-10-CM

## 2017-11-15 NOTE — Telephone Encounter (Signed)
Confirmed remote transmission w/ pt husband.   

## 2017-11-16 ENCOUNTER — Encounter: Payer: Self-pay | Admitting: Cardiology

## 2017-11-16 NOTE — Progress Notes (Signed)
Remote pacemaker transmission.   

## 2017-11-21 ENCOUNTER — Ambulatory Visit: Payer: Self-pay | Admitting: Pharmacist Clinician (PhC)/ Clinical Pharmacy Specialist

## 2017-11-22 DIAGNOSIS — Z8673 Personal history of transient ischemic attack (TIA), and cerebral infarction without residual deficits: Secondary | ICD-10-CM | POA: Diagnosis not present

## 2017-11-22 DIAGNOSIS — L821 Other seborrheic keratosis: Secondary | ICD-10-CM | POA: Diagnosis not present

## 2017-11-22 DIAGNOSIS — L570 Actinic keratosis: Secondary | ICD-10-CM | POA: Diagnosis not present

## 2017-11-22 DIAGNOSIS — L409 Psoriasis, unspecified: Secondary | ICD-10-CM | POA: Diagnosis not present

## 2017-11-26 ENCOUNTER — Other Ambulatory Visit: Payer: Self-pay | Admitting: Family Medicine

## 2017-11-26 DIAGNOSIS — Z1231 Encounter for screening mammogram for malignant neoplasm of breast: Secondary | ICD-10-CM | POA: Diagnosis not present

## 2017-11-26 LAB — HM MAMMOGRAPHY

## 2017-12-05 DIAGNOSIS — R928 Other abnormal and inconclusive findings on diagnostic imaging of breast: Secondary | ICD-10-CM | POA: Diagnosis not present

## 2017-12-05 DIAGNOSIS — N6320 Unspecified lump in the left breast, unspecified quadrant: Secondary | ICD-10-CM | POA: Diagnosis not present

## 2017-12-17 ENCOUNTER — Other Ambulatory Visit: Payer: Self-pay | Admitting: Family Medicine

## 2017-12-19 ENCOUNTER — Encounter: Payer: Self-pay | Admitting: Pharmacist Clinician (PhC)/ Clinical Pharmacy Specialist

## 2017-12-19 ENCOUNTER — Ambulatory Visit (INDEPENDENT_AMBULATORY_CARE_PROVIDER_SITE_OTHER): Payer: Medicare Other | Admitting: Pharmacist Clinician (PhC)/ Clinical Pharmacy Specialist

## 2017-12-19 ENCOUNTER — Ambulatory Visit: Payer: Medicare Other | Admitting: Pharmacist Clinician (PhC)/ Clinical Pharmacy Specialist

## 2017-12-19 DIAGNOSIS — E119 Type 2 diabetes mellitus without complications: Secondary | ICD-10-CM

## 2017-12-19 DIAGNOSIS — I6930 Unspecified sequelae of cerebral infarction: Secondary | ICD-10-CM | POA: Diagnosis not present

## 2017-12-19 DIAGNOSIS — Z23 Encounter for immunization: Secondary | ICD-10-CM | POA: Diagnosis not present

## 2017-12-19 MED ORDER — METFORMIN HCL ER 500 MG PO TB24: 1000.0000 mg | ORAL_TABLET | Freq: Two times a day (BID) | ORAL | 3 refills | Status: DC

## 2017-12-19 NOTE — Progress Notes (Signed)
[name] is a 72 year old female who is referred by Dr. Laurance Flatten for a diabetes follow up visit following a recent increase in her A1C from 7.7% to 9.6% without any medication changes.  Patient is taking metformin ER 1gm qam and glimepiride 2mg  qam.  Her home BG heve been elevated for the past several months, in the high 200's to low 300's.  She has limited mobility due to a stroke.  Her husband states that she eats 2-3 nutty butty's a day and has two english muffins with jelly for breakfast.  Her overall diet is very poor.  A/P:  1.  Extensive dietary counseling was done today.  Patient will eat a healthy protein at each meals of the day and increase low CHO vegetables into her diet.  I educated patient and husband on CHO meal planning and nutritional label reading.  2.  Increase metformin to 1gm bid, patient is taking ER formulation but will split the dose to minimize GI upset.  She has not had any problems thus far and her renal function is normal.  3.  Monitor BG twice daily.  If blood sugars drop to below 100mg /dL fasting, will cut back and discontinue glimepiride.  Total time with patient:  45 minutes  Memory Argue, PharmD, CPP, CLS

## 2017-12-24 ENCOUNTER — Telehealth: Payer: Self-pay | Admitting: Family Medicine

## 2017-12-24 NOTE — Telephone Encounter (Signed)
Patients husband states that Sharyn Lull increased metformin at the visit they had with her but it has caused sever diarrhea and patient was unable to tolerate. He has switched meds back tot he original way. Wanted you to be aware.

## 2017-12-24 NOTE — Telephone Encounter (Signed)
Please have Sharyn Lull readdress this issue when she is here next and call the patient's husband with other suggestions that might be helpful.

## 2017-12-26 LAB — CUP PACEART REMOTE DEVICE CHECK
Battery Remaining Longevity: 82 mo
Battery Remaining Percentage: 65 %
Battery Voltage: 2.9 V
Brady Statistic AP VP Percent: 1 %
Brady Statistic AP VS Percent: 1 %
Brady Statistic AS VP Percent: 1 %
Brady Statistic AS VS Percent: 99 %
Brady Statistic RA Percent Paced: 1 %
Brady Statistic RV Percent Paced: 1 %
Date Time Interrogation Session: 20190926180818
Implantable Lead Implant Date: 20100630
Implantable Lead Implant Date: 20100630
Implantable Lead Location: 753859
Implantable Lead Location: 753860
Implantable Pulse Generator Implant Date: 20100630
Lead Channel Impedance Value: 350 Ohm
Lead Channel Impedance Value: 460 Ohm
Lead Channel Pacing Threshold Amplitude: 1 V
Lead Channel Pacing Threshold Amplitude: 1 V
Lead Channel Pacing Threshold Pulse Width: 0.4 ms
Lead Channel Pacing Threshold Pulse Width: 0.8 ms
Lead Channel Sensing Intrinsic Amplitude: 4.7 mV
Lead Channel Sensing Intrinsic Amplitude: 5 mV
Lead Channel Setting Pacing Amplitude: 2 V
Lead Channel Setting Pacing Amplitude: 2.5 V
Lead Channel Setting Pacing Pulse Width: 0.8 ms
Lead Channel Setting Sensing Sensitivity: 1.5 mV
Pulse Gen Model: 2110
Pulse Gen Serial Number: 2285953

## 2017-12-31 ENCOUNTER — Other Ambulatory Visit: Payer: Self-pay | Admitting: Family Medicine

## 2018-01-02 NOTE — Telephone Encounter (Signed)
I talked with Mr. [name] and instructed him to increase metformin from 2 tablets a day to 1 tablet in the morning and 2 tablets after the largest evening meal.  If she tolerates this increase, will go to 2 tablets twice a day in 4 weeks and stop glimepiride.  Patient's husband will call me next week with an update on how she is doing.

## 2018-01-03 ENCOUNTER — Other Ambulatory Visit: Payer: Self-pay | Admitting: Family Medicine

## 2018-01-10 ENCOUNTER — Telehealth: Payer: Self-pay | Admitting: *Deleted

## 2018-01-10 ENCOUNTER — Telehealth: Payer: Self-pay

## 2018-01-10 ENCOUNTER — Ambulatory Visit: Payer: Medicare Other | Admitting: Adult Health

## 2018-01-10 NOTE — Telephone Encounter (Signed)
Patient no show for appt today. Call to state she was not feeling well.

## 2018-01-10 NOTE — Telephone Encounter (Signed)
Needs to bring urine sample in

## 2018-01-10 NOTE — Progress Notes (Deleted)
[facility]    Provider:  Dr Jaynee Eagles Referring Provider: Chipper Herb, MD Primary Care Physician:  Chipper Herb, MD  CC: Stroke and seizure follow up   HPI: [name] is a 72 year old female with PMH of HTN, HLD, DM, PAF, tachy brady syndrome, s/p permanent pacemaker insertion, and history of stroke (2015). Patient suffered from stroke during atrial fibrillation ablation. Patient had residual right sided weakness and aphasia.  During rehab post stroke, she was diagnosed with seizures. Seizures confirmed on EEG and patient placed on Keppra. Problems with memory loss.   Interval history 01/10/2018: Patient is being seen today for follow-up visit and is accompanied by her husband.  07/19/17 update: Patient returns today for one-year follow-up and is accompanied by her husband.  Patient continues to have expressive aphasia but per her husband this is improving.  She continues to have right-sided hemiparesis.  Per husband she has had a few episodes of staring out where she has a blank stare and does not respond.  This is only happened recently and approximately 3 times.  Denies any other seizure like activity.  Continues to take Keppra 500 mg twice daily.  Patient currently has large ventral hernia that needs to be repaired but patient continues to push off surgery due to fear of not fixing it and making it worse.  Patient is currently being treated for UTI and will be on Keflex, per husband, until July and at that time they will revisit possible surgical procedure.  Continues to take Xarelto with some bruising but no bleeding.  Continues to take Zocor with out side effects of myalgias.  Husband seems to be concerned about patient not having desire to leave house or to be active as he is is eager to leave house but is unable to leave her.  Depression continues but has not worsened since last visit and patient is compliant with Celexa.  Husband seems distraught by this situation  of being a caregiver for his wife.  As macular degeneration is worsening, patient has increased blurriness compared to last visit.  Recommended psychiatry or therapist counseling for her husband as well as for wife but both of them are refusing at this time.  Patient continues to be compliant on all other medications and denies new or worsening stroke/TIA symptoms.  07/19/2016 visit: She has macular degeneration and vision changes since the stroke and they saw a physician and tried glasses. In the last year vision is worsening. She can't see as well. Last year she couldn't pass the eye exam. Her glasses did not help. She sees blurry all the time not worse in the morning or worse at night, slowly worsening over the last year, no pain on eye movement, continuous. No ptosis. No other new weakness. Just blurry vision. If she closes one eye the It is the left eye that is blurry and worsening. Her macular degeneration is worsening. The blurriness does not improve with closing one eye. Recommend an MRi of the braina nd further workup but they want to see ophthalmology first and let me know how they want to proceed. No seizures. She continues to be depressed and less social, but she denies significant changes in mood from last year. She had depression even beofre the stroke, she is a home body and husband wants to get out. She declines any more PT. Her speech is improving.    Review of Systems: Patient complains of symptoms per HPI as well as the following  symptoms: Loss of vision, cough, wheezing, shortness of breath, constipation, restless leg, daytime sleepiness, incontinence of bladder, frequency of urination, walking difficulty, seizure, speech difficulty, behavior problems, confusion, depression, and nervous/anxious. Pertinent negatives per HPI. All others negative..  Social History   Socioeconomic History  . Marital status: Married    Spouse name: [name]"  . Number of children: 2  . Years of  education: 14 yrs  . Highest education level: Associate degree: academic program  Occupational History  . 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  Social Needs  . Financial resource strain: Not hard at all  . Food insecurity:    Worry: Never true    Inability: Never true  . Transportation needs:    Medical: No    Non-medical: No  Tobacco Use  . Smoking status: Never Smoker  . Smokeless tobacco: Never Used  Substance and Sexual Activity  . Alcohol use: No  . Drug use: No  . Sexual activity: Yes  Lifestyle  . Physical activity:    Days per week: 0 days    Minutes per session: 0 min  . Stress: To some extent  Relationships  . Social connections:    Talks on phone: More than three times a week    Gets together: More than three times a week    Attends religious service: Never    Active member of club or organization: No    Attends meetings of clubs or organizations: Never    Relationship status: Married  . Intimate partner violence:    Fear of current or ex partner: No    Emotionally abused: No    Physically abused: No    Forced sexual activity: No  Other Topics Concern  . Not on file  Social History Narrative   Lives in [place].      Left-handed   Caffeine use: drinks coffee/tea: 2 cups coffee in the morning, drinks 1 glass tea per day     Family History  Problem Relation Age of Onset  . Diabetes Mother   . Heart disease Mother   . Heart failure Mother   . Macular degeneration Mother   . Heart disease Father   . Breast cancer Sister   . Ovarian cancer Sister   . Uterine cancer Paternal Aunt   . Crohn's disease Other        neice  . Diabetes Maternal Grandmother   . Diabetes Sister   . Heart disease Brother   . Heart disease Brother   . Macular degeneration Brother   . Colon cancer Neg Hx   . Stomach cancer Neg Hx     Past Medical History:  Diagnosis Date  . Anemia   . Bronchial spasms   . Chronic anxiety   . Chronic kidney  disease   . Complication of anesthesia    " I shake real bad "  . Diverticulosis   . Elevated blood sugar   . Esophageal stricture   . Family history of anesthesia complication    Daughter also shakes while waking up  . GERD (gastroesophageal reflux disease)   . H/O hiatal hernia   . H/O scarlet fever   . History of bladder repair surgery   . Hx of vaginal hysterectomy   . Hypertension   . Hypothyroidism   . IBS (irritable bowel syndrome)    vs diarrhea vs abd. fullness   . Impaired glucose tolerance   . Neuromuscular disorder ([place])    periferal  neuropathy  . Pacemaker    St. Jude  . Paroxysmal atrial fibrillation (HCC)    a. failed flecainide, tikosyn, amio;  b. 01/2012 s/p RFCA.  . Psoriasis   . Psoriasis   . Rectocele, female   . Seizures ([place])   . Stroke ([place])   . Tachy-brady syndrome ([place])    a. 08/19/2008 s/p PPM: SJM 2110 Accent  . Uterine prolapse   . Vaginal prolapse 1998    Past Surgical History:  Procedure Laterality Date  . ATRIAL FIBRILLATION ABLATION  02/06/12   PVI by Dr Rayann Heman  . ATRIAL FIBRILLATION ABLATION  07/04/2012   repeat PVI by Dr Rayann Heman  . ATRIAL FIBRILLATION ABLATION N/A 02/06/2012   Procedure: ATRIAL FIBRILLATION ABLATION;  Surgeon: Thompson Grayer, MD;  Location[facility] CATH LAB;  Service: Cardiovascular;  Laterality: N/A;  . ATRIAL FIBRILLATION ABLATION N/A 07/04/2012   Procedure: ATRIAL FIBRILLATION ABLATION;  Surgeon: Thompson Grayer, MD;  Location: [facility] CATH LAB;  Service: Cardiovascular;  Laterality: N/A;  . BLADDER SUSPENSION    . INSERT / REPLACE / REMOVE PACEMAKER     SJM  . LEFT HEART CATHETERIZATION WITH CORONARY ANGIOGRAM N/A 03/24/2013   Procedure: LEFT HEART CATHETERIZATION WITH CORONARY ANGIOGRAM;  Surgeon: Blane Ohara, MD;  Location: [facility] CATH LAB;  Service: Cardiovascular;  Laterality: N/A;  . RECTOCELE REPAIR    . TEE WITHOUT CARDIOVERSION  02/06/2012   Procedure: TRANSESOPHAGEAL ECHOCARDIOGRAM (TEE);  Surgeon: Thayer Headings, MD;   Location: [facility];  Service: Cardiovascular;  Laterality: N/A;  . TEE WITHOUT CARDIOVERSION N/A 07/04/2012   Procedure: TRANSESOPHAGEAL ECHOCARDIOGRAM (TEE);  Surgeon: Lelon Perla, MD;  Location: [facility] ENDOSCOPY;  Service: Cardiovascular;  Laterality: N/A;  . TRANSTHORACIC ECHOCARDIOGRAM  2008  . VAGINAL HYSTERECTOMY     prolapse     Current Outpatient Medications  Medication Sig Dispense Refill  . acetaminophen (TYLENOL) 500 MG tablet Take 500 mg by mouth every 6 (six) hours as needed (pain).     . Ascorbic Acid (VITAMIN C PO) Take 1 tablet by mouth daily.    . Calcium Carbonate-Vitamin D (CALCIUM-VITAMIN D) 500-200 MG-UNIT per tablet Take 1 tablet by mouth daily.    . cephALEXin (KEFLEX) 500 MG capsule Take 1 capsule by mouth daily.  6  . cholecalciferol (VITAMIN D) 1000 UNITS tablet Take 2,000 Units by mouth daily.     . citalopram (CELEXA) 20 MG tablet TAKE 1 TABLET(20 MG) BY MOUTH TWICE DAILY 180 tablet 0  . cyclobenzaprine (FLEXERIL) 5 MG tablet Take 1 tablet (5 mg total) by mouth 3 (three) times daily as needed for muscle spasms. 30 tablet 0  . dicyclomine (BENTYL) 10 MG capsule TAKE 1 CAPSULE BY MOUTH FOUR TIMES DAILY BEFORE MEALS AND AT BEDTIME 360 capsule 3  . fluticasone (FLONASE) 50 MCG/ACT nasal spray SHAKE LIQUID AND USE 2 SPRAYS IN EACH NOSTRIL DAILY 48 g 1  . folic acid (FOLVITE) 1 MG tablet TAKE 1 TABLET(1 MG) BY MOUTH DAILY 90 tablet 0  . furosemide (LASIX) 20 MG tablet Take 1 tablet (20 mg total) by mouth daily. 90 tablet 3  . glimepiride (AMARYL) 2 MG tablet TAKE 1 TABLET(2 MG) BY MOUTH DAILY BEFORE BREAKFAST 90 tablet 0  . glucose blood (ONE TOUCH ULTRA TEST) test strip TEST BLOOD SUGAR ONCE A DAY 100 each 3  . levETIRAcetam (KEPPRA) 500 MG tablet Take 1 tablet (500 mg total) by mouth 2 (two) times daily. 180 tablet 4  . levothyroxine (SYNTHROID, LEVOTHROID) 88 MCG tablet  TAKE 1 TABLET BY MOUTH EVERY DAY BEFORE BREAKFAST 90 tablet 2  . loperamide (IMODIUM) 2 MG  capsule Take by mouth as needed for diarrhea or loose stools (Take as directed). Reported on 05/17/2015    . metFORMIN (GLUCOPHAGE-XR) 500 MG 24 hr tablet Take 2 tablets (1,000 mg total) by mouth 2 (two) times daily after a meal. 360 tablet 3  . methotrexate (RHEUMATREX) 2.5 MG tablet Take 4 tablets (10 mg total) by mouth once a week. Caution:Chemotherapy. Protect from light. 16 tablet 1  . metoprolol tartrate (LOPRESSOR) 50 MG tablet TAKE 1 TABLET BY MOUTH TWICE DAILY 180 tablet 3  . mirtazapine (REMERON) 15 MG tablet TAKE 1 TABLET(15 MG) BY MOUTH AT BEDTIME 90 tablet 0  . Multiple Vitamins-Minerals (EYE VITAMINS PO) Take 2 tablets by mouth daily. AREDS formula    . NYSTATIN powder APPLY TWICE DAILY UNDERNEATH BREASTS 60 g 0  . omeprazole (PRILOSEC) 40 MG capsule TAKE 1 CAPSULE BY MOUTH EVERY DAY 90 capsule 1  . phenazopyridine (PYRIDIUM) 200 MG tablet Take 1 tablet (200 mg total) by mouth 3 (three) times daily as needed for pain ((burning urination)). 9 tablet 0  . potassium chloride SA (K-DUR,KLOR-CON) 20 MEQ tablet TAKE 2 TABLETS BY MOUTH TWICE DAILY 120 tablet 0  . Probiotic Product (PROBIOTIC PO) Take 1 capsule by mouth daily.    . simvastatin (ZOCOR) 40 MG tablet TAKE 1 TABLET BY MOUTH EVERY DAY 90 tablet 0  . traMADol (ULTRAM) 50 MG tablet Take 1/2 tab by mouth four times daily as needed for severe pain    . XARELTO 20 MG TABS tablet TAKE 1 TABLET BY MOUTH EVERY DAY 90 tablet 0   No current facility-administered medications for this visit.     Allergies as of 01/10/2018 - Review Complete 12/19/2017  Allergen Reaction Noted  . Latex Rash 06/27/2012  . Caudal tray [lidocaine-epinephrine]  06/22/2010  . Ciprofloxacin hcl Nausea Only 06/03/2012  . Codeine Nausea Only   . Diovan [valsartan] Itching 06/22/2010  . Doxycycline Other (See Comments)   . Esomeprazole magnesium  06/22/2010  . Levofloxacin  06/22/2010  . Myrbetriq [mirabegron] Diarrhea 12/01/2013  . Neomycin-bacitracin  zn-polymyx    . Penicillins    . Sulfa drugs cross reactors Other (See Comments) 06/22/2010  . Verapamil  06/22/2010  . Vimovo [naproxen-esomeprazole]  06/22/2010    Vitals: There were no vitals taken for this visit. Last Weight:  Wt Readings from Last 1 Encounters:  10/17/17 177 lb (80.3 kg)   Last Height:   Ht Readings from Last 1 Encounters:  10/17/17 5\' 4"  (1.626 m)    Speech:  Expressive aphasia, comprehension appears to be intact Cognition:  The patient is oriented to person, date, day, month, year Cranial Nerves:  physiologic anisocoria left pupil 57mm right 61mm but both round, and equally reactive to light. Visual fields are full to finger confrontation. Extraocular movements are intact. Trigeminal sensation is intact and the muscles of mastication are normal. Right lower facial droop. The palate elevates in the midline. Hearing intact. Voice is normal. Shoulder shrug is normal. The tongue has normal motion without fasciculations.   Coordination:  No dysmetria on the left, cannot perform on the right due to weakness  Gait: Patient has hobbling gait and ambulating with cane  Motor Observation:  No asymmetry, no atrophy, and no involuntary movements noted.  Tone:  Normal muscle tone.   Posture:  Posture is normal. normal erect   Strength: right arm and hip flexion  4/5 and right DF 4+/5 (wearing sling on right arm to assist with weakness)  Otherwise strength is V/V in the left upper and left lower limbs.    Sensation: Decreased sensation in right upper and lower extremity   Reflex Exam: 2+ bilaterally and symmetrically   Toes:  Right upgoing  Clonus:  Clonus is absent.   Assessment/Plan: 72 year-old patient who presents with her husband here for follow up of stroke with residual right-sided hemiparesis and aphasia. By account she appears to be doing very well after her stroke as far as physical therapy, occupational  therapy and speech therapy are concern. However patient appears to have significant depression which is concerning for husband and son, discussed depression at length with family, this is very common after stroke. Patient is on Celexa and follows closely with Dr. Redge Gainer. They have been evaluated by primary care to ensure there are no medical causes for patient's fatigue and depression including thyroid testing, ensuring there is no anemia, or any infection or any other metabolic, toxic etiology. Highly recommended following up with therapy and psychiatry. The family appears very earnest in their concern and caring for patient, the effects of stroke on a family can be devastating. Discussed this at length with family.   -Continue Keppra 500 mg twice a day - refill sent in to [facility] and Zocor for secondary stroke prevention -Follow-up with PCP regarding HLD, HTN and DM -Highly recommended both patient and husband participate in therapy and/or see a psychiatrist as effects from a stroke can be equally difficult on both the patient and the caregiver -Highly recommended that patient becomes active even if this means getting out of the house and riding in the car someone -Maintain strict control of hypertension with blood pressure goal below 130/90, diabetes with hemoglobin A1c goal below 6.5% and cholesterol with LDL cholesterol (bad cholesterol) goal below 70 mg/dL. I also advised the patient to eat a healthy diet with plenty of whole grains, cereals, fruits and vegetables, exercise regularly and maintain ideal body weight.  Follow-up in 6 months or call earlier if needed  Venancio Poisson, [facility]  [facility] [address] [place], [place] [address]  Phone [phone] Fax [phone]

## 2018-01-10 NOTE — Telephone Encounter (Signed)
Patients daughter states her mom has a UTI and Dr. Laurance Flatten would usually just treat her because it happens frequently. Complaining of burning and increased frequency. Please advise

## 2018-01-10 NOTE — Telephone Encounter (Signed)
pts husband will bring specimen tomorrow

## 2018-01-11 ENCOUNTER — Other Ambulatory Visit: Payer: Self-pay | Admitting: Family Medicine

## 2018-01-11 ENCOUNTER — Other Ambulatory Visit: Payer: Self-pay | Admitting: Nurse Practitioner

## 2018-01-11 ENCOUNTER — Other Ambulatory Visit: Payer: Medicare Other

## 2018-01-11 ENCOUNTER — Telehealth: Payer: Self-pay

## 2018-01-11 DIAGNOSIS — R300 Dysuria: Secondary | ICD-10-CM

## 2018-01-11 LAB — URINALYSIS, COMPLETE
Bilirubin, UA: NEGATIVE
Glucose, UA: NEGATIVE
Ketones, UA: NEGATIVE
Nitrite, UA: NEGATIVE
Protein, UA: NEGATIVE
RBC, UA: NEGATIVE
Specific Gravity, UA: 1.025 (ref 1.005–1.030)
Urobilinogen, Ur: 0.2 mg/dL (ref 0.2–1.0)
pH, UA: 5.5 (ref 5.0–7.5)

## 2018-01-11 LAB — MICROSCOPIC EXAMINATION
Renal Epithel, UA: NONE SEEN /hpf
WBC, UA: >30 /hpf — AB (ref 0–5)

## 2018-01-11 MED ORDER — CEPHALEXIN 500 MG PO CAPS: 500.0000 mg | ORAL_CAPSULE | Freq: Three times a day (TID) | ORAL | 0 refills | Status: DC

## 2018-01-11 NOTE — Telephone Encounter (Signed)
Notified husband that antibiotic was sent to pharmacy for urine infection. Husband verbalized understanding

## 2018-01-13 LAB — URINE CULTURE

## 2018-01-22 ENCOUNTER — Encounter: Payer: Self-pay | Admitting: Adult Health

## 2018-01-24 ENCOUNTER — Other Ambulatory Visit: Payer: Self-pay | Admitting: *Deleted

## 2018-01-24 MED ORDER — GLIMEPIRIDE 2 MG PO TABS: ORAL_TABLET | ORAL | 0 refills | Status: DC

## 2018-01-31 ENCOUNTER — Other Ambulatory Visit: Payer: Self-pay | Admitting: Family Medicine

## 2018-02-04 ENCOUNTER — Other Ambulatory Visit: Payer: Medicare Other

## 2018-02-04 ENCOUNTER — Telehealth: Payer: Self-pay | Admitting: Family Medicine

## 2018-02-04 DIAGNOSIS — N390 Urinary tract infection, site not specified: Secondary | ICD-10-CM

## 2018-02-04 NOTE — Telephone Encounter (Signed)
Future order placed for urine culture.

## 2018-02-08 ENCOUNTER — Ambulatory Visit (INDEPENDENT_AMBULATORY_CARE_PROVIDER_SITE_OTHER): Payer: Medicare Other | Admitting: Urology

## 2018-02-08 DIAGNOSIS — R1084 Generalized abdominal pain: Secondary | ICD-10-CM | POA: Diagnosis not present

## 2018-02-08 DIAGNOSIS — N3020 Other chronic cystitis without hematuria: Secondary | ICD-10-CM | POA: Diagnosis not present

## 2018-02-08 DIAGNOSIS — N3941 Urge incontinence: Secondary | ICD-10-CM | POA: Diagnosis not present

## 2018-02-08 LAB — URINE CULTURE

## 2018-02-13 ENCOUNTER — Other Ambulatory Visit: Payer: Self-pay | Admitting: Family Medicine

## 2018-02-14 ENCOUNTER — Ambulatory Visit (INDEPENDENT_AMBULATORY_CARE_PROVIDER_SITE_OTHER): Payer: Medicare Other

## 2018-02-14 DIAGNOSIS — I495 Sick sinus syndrome: Secondary | ICD-10-CM | POA: Diagnosis not present

## 2018-02-14 NOTE — Telephone Encounter (Signed)
OV 02/27/18

## 2018-02-15 LAB — CUP PACEART REMOTE DEVICE CHECK
Battery Remaining Longevity: 71 mo
Battery Remaining Percentage: 57 %
Battery Voltage: 2.89 V
Brady Statistic AP VP Percent: 1 %
Brady Statistic AP VS Percent: 1 %
Brady Statistic AS VP Percent: 1 %
Brady Statistic AS VS Percent: 99 %
Brady Statistic RA Percent Paced: 1 %
Brady Statistic RV Percent Paced: 1 %
Date Time Interrogation Session: 20191226205423
Implantable Lead Implant Date: 20100630
Implantable Lead Implant Date: 20100630
Implantable Lead Location: 753859
Implantable Lead Location: 753860
Implantable Pulse Generator Implant Date: 20100630
Lead Channel Impedance Value: 350 Ohm
Lead Channel Impedance Value: 440 Ohm
Lead Channel Pacing Threshold Amplitude: 1 V
Lead Channel Pacing Threshold Amplitude: 1 V
Lead Channel Pacing Threshold Pulse Width: 0.4 ms
Lead Channel Pacing Threshold Pulse Width: 0.8 ms
Lead Channel Sensing Intrinsic Amplitude: 5 mV
Lead Channel Sensing Intrinsic Amplitude: 5 mV
Lead Channel Setting Pacing Amplitude: 2 V
Lead Channel Setting Pacing Amplitude: 2.5 V
Lead Channel Setting Pacing Pulse Width: 0.8 ms
Lead Channel Setting Sensing Sensitivity: 1.5 mV
Pulse Gen Model: 2110
Pulse Gen Serial Number: 2285953

## 2018-02-15 NOTE — Progress Notes (Signed)
Remote pacemaker transmission.   

## 2018-02-19 ENCOUNTER — Other Ambulatory Visit: Payer: Self-pay | Admitting: Urology

## 2018-02-19 ENCOUNTER — Other Ambulatory Visit (HOSPITAL_COMMUNITY): Payer: Self-pay | Admitting: Urology

## 2018-02-19 DIAGNOSIS — R109 Unspecified abdominal pain: Secondary | ICD-10-CM

## 2018-02-19 DIAGNOSIS — N3941 Urge incontinence: Secondary | ICD-10-CM

## 2018-02-19 DIAGNOSIS — N3020 Other chronic cystitis without hematuria: Secondary | ICD-10-CM

## 2018-02-20 ENCOUNTER — Other Ambulatory Visit: Payer: Self-pay | Admitting: Family Medicine

## 2018-02-26 ENCOUNTER — Ambulatory Visit (HOSPITAL_COMMUNITY)
Admission: RE | Admit: 2018-02-26 | Discharge: 2018-02-26 | Disposition: A | Discharge status: Home / Self Care | Payer: Medicare Other | Source: Ambulatory Visit | Attending: Urology | Admitting: Urology

## 2018-02-26 DIAGNOSIS — N3941 Urge incontinence: Secondary | ICD-10-CM | POA: Diagnosis not present

## 2018-02-26 DIAGNOSIS — R109 Unspecified abdominal pain: Secondary | ICD-10-CM | POA: Diagnosis not present

## 2018-02-26 DIAGNOSIS — N3020 Other chronic cystitis without hematuria: Secondary | ICD-10-CM

## 2018-02-27 ENCOUNTER — Ambulatory Visit (INDEPENDENT_AMBULATORY_CARE_PROVIDER_SITE_OTHER): Payer: Medicare Other | Admitting: Family Medicine

## 2018-02-27 ENCOUNTER — Encounter: Payer: Self-pay | Admitting: Family Medicine

## 2018-02-27 VITALS — BP 126/65 | HR 71 | Temp 97.3°F | Ht 64.0 in | Wt 169.0 lb

## 2018-02-27 DIAGNOSIS — I480 Paroxysmal atrial fibrillation: Secondary | ICD-10-CM | POA: Diagnosis not present

## 2018-02-27 DIAGNOSIS — N390 Urinary tract infection, site not specified: Secondary | ICD-10-CM

## 2018-02-27 DIAGNOSIS — E119 Type 2 diabetes mellitus without complications: Secondary | ICD-10-CM | POA: Diagnosis not present

## 2018-02-27 DIAGNOSIS — I10 Essential (primary) hypertension: Secondary | ICD-10-CM

## 2018-02-27 DIAGNOSIS — E559 Vitamin D deficiency, unspecified: Secondary | ICD-10-CM

## 2018-02-27 DIAGNOSIS — E034 Atrophy of thyroid (acquired): Secondary | ICD-10-CM | POA: Diagnosis not present

## 2018-02-27 DIAGNOSIS — I6930 Unspecified sequelae of cerebral infarction: Secondary | ICD-10-CM

## 2018-02-27 DIAGNOSIS — K429 Umbilical hernia without obstruction or gangrene: Secondary | ICD-10-CM

## 2018-02-27 DIAGNOSIS — G8311 Monoplegia of lower limb affecting right dominant side: Secondary | ICD-10-CM | POA: Diagnosis not present

## 2018-02-27 DIAGNOSIS — R569 Unspecified convulsions: Secondary | ICD-10-CM | POA: Diagnosis not present

## 2018-02-27 DIAGNOSIS — I6349 Cerebral infarction due to embolism of other cerebral artery: Secondary | ICD-10-CM

## 2018-02-27 DIAGNOSIS — E785 Hyperlipidemia, unspecified: Secondary | ICD-10-CM

## 2018-02-27 NOTE — Progress Notes (Signed)
Subjective:    Patient ID: [name], female    DOB: [birth year], 73 y.o.   MRN: [id number]  HPI Pt here for follow up and management of chronic medical problems which includes diabetes and hypertension. She is taking medication regularly.  Patient continues to have chronic urinary tract infections and is followed regularly by the urologist Dr. Marybelle Killings.  He currently has her on Macrobid and had an ultrasound done yesterday.  She also complains with the nose being dry and soreness in the nose.  They are using a humidifier.  She also has a cough.  She has had 2  spells, where she had some vomiting and was lethargic after the vomiting.  Her vital signs today were stable on the repeat with a blood pressure 126/65.  Patient's husband brings in vital signs from home and he has been very studious about this and all of these show excellent blood pressures from home and I will be scanned into the record.  Blood sugars also look pretty good.  The patient comes today to the visit with her husband who is been extremely helpful in checking her vital signs and monitoring her closely.  He brings a thorough record which will be scanned into her chart.  She is being followed closely by the urologist because of chronic urinary tract infections and is taking medication for this and recently had an ultrasound which basically showed a fatty liver with normal renal.  She does complain of the nasal dryness and soreness.  She also denies any chest pain.  She has had some increasing problems with shortness of breath and both she and her husband attribute this to lack of activity.  She has weakness and doing the same thing this year that she did last year she is not able to do it as well.  She denies any trouble with nausea vomiting diarrhea or change in bowel habits.  She is seen the urologist for her urinary tract infections.  Both of these episodes that we are concerned about 1 happened late at night about 1:00 in the morning and  another 1 happened when she was delayed from eating at about 7:00 in the evening.  The patient has seen the neurologist in the past and we will encourage him to follow-up with a visit with Dr. Lavell Anchors.  In the meantime we will also encourage him to check some blood sugars especially if they are going out to eat or if it is delayed from the previous meal.  The sound like hypoglycemic episodes.  They could be seizure episodes.  This is why she needs to go see the neurologist.    Patient Active Problem List   Diagnosis Date Noted  . Aortic atherosclerosis ([place]) 04/27/2017  . Type 2 diabetes mellitus ([place]) 05/17/2015  . Cerebrovascular accident (CVA) due to embolism of left middle cerebral artery ([place]) 11/24/2014  . Focal and partial seizures ([place]) 11/24/2014  . Depression 11/24/2014  . Cerebral infarction due to embolism of other cerebral artery ([place]) 09/03/2013  . Metabolic syndrome 17/00/1749  . High risk medication use 03/13/2013  . Fever, unspecified 06/05/2012  . Generalized abdominal pain 06/05/2012  . PAF (paroxysmal atrial fibrillation) ([place]) 02/07/2012  . Tachy-brady syndrome ([place])   . Impaired glucose tolerance   . Fatigue 12/22/2009  . SNORING 12/22/2009  . OTHER CHRONIC NONALCOHOLIC LIVER DISEASE 44/96/7591  . NONSPEC ELEVATION OF LEVELS OF TRANSAMINASE/LDH 03/09/2009  . PPM-[place] 09/01/2008  . BRADYCARDIA 08/13/2008  . Hypothyroidism 06/09/2008  .  HTN (hypertension) 06/09/2008  . Atrial fibrillation ([place]) 06/09/2008  . PREMATURE VENTRICULAR CONTRACTIONS 06/09/2008  . IRRITABLE BOWEL SYNDROME 06/09/2008  . PSORIASIS 06/09/2008  . EDEMA 06/09/2008  . DIVERTICULOSIS, COLON, HX OF 06/09/2008   Outpatient Encounter Medications as of 02/27/2018  Medication Sig  . acetaminophen (TYLENOL) 500 MG tablet Take 500 mg by mouth every 6 (six) hours as needed (pain).   . Ascorbic Acid (VITAMIN C PO) Take 1 tablet by mouth daily.  . Calcium Carbonate-Vitamin D (CALCIUM-VITAMIN D)  500-200 MG-UNIT per tablet Take 1 tablet by mouth daily.  . cholecalciferol (VITAMIN D) 1000 UNITS tablet Take 2,000 Units by mouth daily.   . citalopram (CELEXA) 20 MG tablet TAKE 1 TABLET(20 MG) BY MOUTH TWICE DAILY  . cyclobenzaprine (FLEXERIL) 5 MG tablet Take 1 tablet (5 mg total) by mouth 3 (three) times daily as needed for muscle spasms.  Marland Kitchen dicyclomine (BENTYL) 10 MG capsule TAKE 1 CAPSULE BY MOUTH FOUR TIMES DAILY BEFORE MEALS AND AT BEDTIME  . fluticasone (FLONASE) 50 MCG/ACT nasal spray SHAKE LIQUID AND USE 2 SPRAYS IN EACH NOSTRIL DAILY  . folic acid (FOLVITE) 1 MG tablet TAKE 1 TABLET(1 MG) BY MOUTH DAILY  . glimepiride (AMARYL) 2 MG tablet TAKE 1 TABLET(2 MG) BY MOUTH DAILY BEFORE BREAKFAST  . glucose blood test strip TEST SUGAR DAILY  . levETIRAcetam (KEPPRA) 500 MG tablet Take 1 tablet (500 mg total) by mouth 2 (two) times daily.  Marland Kitchen levothyroxine (SYNTHROID, LEVOTHROID) 88 MCG tablet TAKE 1 TABLET BY MOUTH EVERY DAY BEFORE BREAKFAST  . loperamide (IMODIUM) 2 MG capsule Take by mouth as needed for diarrhea or loose stools (Take as directed). Reported on 05/17/2015  . metFORMIN (GLUCOPHAGE-XR) 500 MG 24 hr tablet Take 2 tablets (1,000 mg total) by mouth 2 (two) times daily after a meal.  . methotrexate (RHEUMATREX) 2.5 MG tablet Take 4 tablets (10 mg total) by mouth once a week. Caution:Chemotherapy. Protect from light.  . metoprolol tartrate (LOPRESSOR) 50 MG tablet TAKE 1 TABLET BY MOUTH TWICE DAILY  . mirtazapine (REMERON) 15 MG tablet TAKE 1 TABLET(15 MG) BY MOUTH AT BEDTIME  . Multiple Vitamins-Minerals (EYE VITAMINS PO) Take 2 tablets by mouth daily. AREDS formula  . NYSTATIN powder APPLY TWICE DAILY UNDERNEATH BREASTS  . omeprazole (PRILOSEC) 40 MG capsule TAKE 1 CAPSULE BY MOUTH EVERY DAY  . phenazopyridine (PYRIDIUM) 200 MG tablet Take 1 tablet (200 mg total) by mouth 3 (three) times daily as needed for pain ((burning urination)).  . potassium chloride SA (K-DUR,KLOR-CON) 20  MEQ tablet TAKE 2 TABLETS BY MOUTH TWICE DAILY  . Probiotic Product (PROBIOTIC PO) Take 1 capsule by mouth daily.  . simvastatin (ZOCOR) 40 MG tablet TAKE 1 TABLET BY MOUTH EVERY DAY  . traMADol (ULTRAM) 50 MG tablet Take 1/2 tab by mouth four times daily as needed for severe pain  . XARELTO 20 MG TABS tablet TAKE 1 TABLET BY MOUTH EVERY DAY  . furosemide (LASIX) 20 MG tablet Take 1 tablet (20 mg total) by mouth daily.  . [DISCONTINUED] cephALEXin (KEFLEX) 500 MG capsule Take 1 capsule by mouth daily.  . [DISCONTINUED] cephALEXin (KEFLEX) 500 MG capsule Take 1 capsule (500 mg total) by mouth 3 (three) times daily.   No facility-administered encounter medications on file as of 02/27/2018.      Review of Systems  Constitutional: Negative.        Had 2 episodes of vomiting and being lethargic   HENT: Positive for sinus pain (sore  and dryness of nose ).   Eyes: Negative for visual disturbance (macular degeneration  sees Dr Owens Shark).  Respiratory: Positive for cough.   Cardiovascular: Negative.   Gastrointestinal: Negative.   Endocrine: Negative.   Genitourinary: Negative.   Musculoskeletal: Negative.   Skin: Negative.   Allergic/Immunologic: Negative.   Neurological: Negative.   Hematological: Negative.   Psychiatric/Behavioral: Negative.        Depressed       Objective:   Physical Exam Vitals signs and nursing note reviewed.  Constitutional:      Appearance: Normal appearance. She is well-developed. She is obese. She is not ill-appearing.     Comments: The patient is pleasant and her husband does most of the speaking for her because of her stroke.  She does seem to understand everything but just cannot express herself well.  HENT:     Head: Normocephalic and atraumatic.     Right Ear: Tympanic membrane, ear canal and external ear normal. There is no impacted cerumen.     Left Ear: Tympanic membrane, ear canal and external ear normal. There is no impacted cerumen.     Nose:  Congestion present.     Comments: Nasal turbinate congestion bilaterally with some mild bleeding from the left nostril.    Mouth/Throat:     Mouth: Mucous membranes are moist.     Pharynx: Oropharynx is clear.  Eyes:     General: No scleral icterus.       Right eye: No discharge.        Left eye: No discharge.     Conjunctiva/sclera: Conjunctivae normal.     Pupils: Pupils are equal, round, and reactive to light.  Neck:     Musculoskeletal: Normal range of motion and neck supple.     Thyroid: No thyromegaly.     Vascular: No carotid bruit or JVD.     Comments: No bruits thyromegaly or anterior cervical adenopathy Cardiovascular:     Rate and Rhythm: Normal rate and regular rhythm.     Pulses: Normal pulses.     Heart sounds: Normal heart sounds. No murmur.     Comments: The heart appears to be regular today at 72/min Pulmonary:     Effort: Pulmonary effort is normal. No respiratory distress.     Breath sounds: Normal breath sounds. No wheezing or rales.     Comments: Clear anteriorly and posteriorly without wheezes or rales. Chest:     Chest wall: No tenderness.  Abdominal:     General: Bowel sounds are normal.     Palpations: Abdomen is soft. There is no mass.     Tenderness: There is abdominal tenderness. There is guarding. There is no rebound.     Comments: Large abdominal wall hernia that is tender to palpation and repairing this is been delayed because of recurrent urinary tract infections.  Also the patient is reluctant to have this repaired.  Musculoskeletal:        General: No tenderness.     Comments: The patient walks with her right arm in a sling and her left hand with a fist.  All this is due to the stroke she has had and she uses a cane for ambulation.  Lymphadenopathy:     Cervical: No cervical adenopathy.  Skin:    General: Skin is warm and dry.     Findings: No rash.  Neurological:     Mental Status: She is alert and oriented to person, place, and time. Mental  status is at baseline.     Cranial Nerves: No cranial nerve deficit.     Motor: Weakness present.     Coordination: Coordination abnormal.     Gait: Gait abnormal.     Deep Tendon Reflexes: Reflexes abnormal.     Comments: Patient had left middle cerebral infarction and has right-sided weakness   Psychiatric:        Mood and Affect: Mood normal.        Behavior: Behavior normal.        Thought Content: Thought content normal.        Judgment: Judgment normal.     Comments: Patient's mood behavior and thought content are normal for her.    BP 126/65 (BP Location: Left Arm)   Pulse 71   Temp (!) 97.3 F (36.3 C) (Oral)   Ht 5' 4" (1.626 m)   Wt 169 lb (76.7 kg)   BMI 29.01 kg/m         Assessment & Plan:  1. Type 2 diabetes mellitus without complication, without long-term current use of insulin (HCC) -Continue to check blood sugars regularly at home.  Because of the spells that she has had recently please check blood sugars if there is a prolonged timeframe between eating and the next episode of eating just to see if the blood sugar drops too low.  Also check some blood sugars between the evening meal and when going to bed late and see if the blood sugar drops at that time.  These readings will be valuable both to me and to the neurologist when that appointment occurs. - CBC with Differential/Platelet - Bayer DCA Hb A1c Waived  2. Hyperlipidemia, unspecified hyperlipidemia type -Continue as aggressive therapeutic lifestyle changes as possible - CBC with Differential/Platelet - Lipid panel  3. Essential hypertension -The blood pressure on repeat today was good and similar to those from home. - BMP8+EGFR - CBC with Differential/Platelet - Hepatic function panel  4. Vitamin D deficiency -Continue with vitamin D replacement pending results of lab work - CBC with Differential/Platelet - VITAMIN D 25 Hydroxy (Vit-D Deficiency, Fractures)  5. PAF (paroxysmal atrial  fibrillation) ([place]) -Patient was in normal sinus rhythm today at 72/min - CBC with Differential/Platelet  6. Hypothyroidism due to acquired atrophy of thyroid -Continue current treatment - CBC with Differential/Platelet  7. Seizure-like activity (HCC) -Check blood sugars closely and keep appointment with Dr. Lavell Anchors - Ambulatory referral to Neurology  8. Cerebral infarction due to embolism of other cerebral artery ([place]) - Ambulatory referral to Neurology  9. Paralysis of right lower extremity (HCC) -Secondary to middle cerebral infarction on the left.  10. Umbilical hernia without obstruction and without gangrene -This still bothers the patient but she has been reluctant to have any surgery and especially she has had urinary tract infections and the surgeon Dr. gross did not want to operate on her until the chronic urinary tract infections were improving  11. Frequent UTI -Follow-up with urology as planned  No orders of the defined types were placed in this encounter.  Patient Instructions                       Medicare Annual Wellness Visit  [facility] and the medical providers at [facility] strive to bring you the best medical care.  In doing so we not only want to address your current medical conditions and concerns but also to detect new conditions early and prevent illness,  disease and health-related problems.    Medicare offers a yearly Wellness Visit which allows our clinical staff to assess your need for preventative services including immunizations, lifestyle education, counseling to decrease risk of preventable diseases and screening for fall risk and other medical concerns.    This visit is provided free of charge (no copay) for all Medicare recipients. The clinical pharmacists at [facility] have begun to conduct these Wellness Visits which will also include a thorough review of all your medications.    As you primary  medical provider recommend that you make an appointment for your Annual Wellness Visit if you have not done so already this year.  You may set up this appointment before you leave today or you may call back ([phone]) and schedule an appointment.  Please make sure when you call that you mention that you are scheduling your Annual Wellness Visit with the clinical pharmacist so that the appointment may be made for the proper length of time.     Continue current medications. Continue good therapeutic lifestyle changes which include good diet and exercise. Fall precautions discussed with patient. If an FOBT was given today- please return it to our front desk. If you are over 43 years old - you may need Prevnar 4 or the adult Pneumonia vaccine.  **Flu shots are available--- please call and schedule a FLU-CLINIC appointment**  After your visit with [place] today you will receive a survey in the mail or online from [employer] regarding your care with [place]. Please take a moment to fill this out. Your feedback is very important to [place] as you can help [place] better understand your patient needs as well as improve your experience and satisfaction. WE CARE ABOUT YOU!!!    Check Blood sugars late at night and between long times of eating Follow up with Dr Jaynee Eagles and DR Mariel Kansky MD

## 2018-02-27 NOTE — Patient Instructions (Addendum)
Medicare Annual Wellness Visit  [facility] and the medical providers at [facility] strive to bring you the best medical care.  In doing so we not only want to address your current medical conditions and concerns but also to detect new conditions early and prevent illness, disease and health-related problems.    Medicare offers a yearly Wellness Visit which allows our clinical staff to assess your need for preventative services including immunizations, lifestyle education, counseling to decrease risk of preventable diseases and screening for fall risk and other medical concerns.    This visit is provided free of charge (no copay) for all Medicare recipients. The clinical pharmacists at [facility] have begun to conduct these Wellness Visits which will also include a thorough review of all your medications.    As you primary medical provider recommend that you make an appointment for your Annual Wellness Visit if you have not done so already this year.  You may set up this appointment before you leave today or you may call back ([phone]) and schedule an appointment.  Please make sure when you call that you mention that you are scheduling your Annual Wellness Visit with the clinical pharmacist so that the appointment may be made for the proper length of time.     Continue current medications. Continue good therapeutic lifestyle changes which include good diet and exercise. Fall precautions discussed with patient. If an FOBT was given today- please return it to our front desk. If you are over 2 years old - you may need Prevnar 61 or the adult Pneumonia vaccine.  **Flu shots are available--- please call and schedule a FLU-CLINIC appointment**  After your visit with [place] today you will receive a survey in the mail or online from [employer] regarding your care with [place]. Please take a moment to fill this out. Your feedback is very  important to [place] as you can help [place] better understand your patient needs as well as improve your experience and satisfaction. WE CARE ABOUT YOU!!!    Check Blood sugars late at night and between long times of eating Follow up with Dr Jaynee Eagles and DR Jeffie Pollock

## 2018-03-06 ENCOUNTER — Other Ambulatory Visit: Payer: Self-pay | Admitting: Family Medicine

## 2018-03-07 ENCOUNTER — Other Ambulatory Visit: Payer: Self-pay | Admitting: Family Medicine

## 2018-03-15 ENCOUNTER — Telehealth: Payer: Self-pay | Admitting: Family Medicine

## 2018-03-15 NOTE — Telephone Encounter (Signed)
PT spouse has called wanting to see if [name] could find out what Dr Lyman Speller at [facility] is needing for them to be able to prescribe methotrexate to the pt. Their fax number is [phone]. Pt spouse states that he isn't sure what they are  Needing it may just be labs.

## 2018-03-15 NOTE — Telephone Encounter (Signed)
Pt's spouse states [name] missed her last appt with Derm because she just didn't feel good and they can only see her at 9 AM and he says he can't get her there that early. Advised pt's husband he needs to contact derm to try and work out arrangement on a time that works for pt. He states he has already called them and is waiting for a return call from them.

## 2018-03-21 ENCOUNTER — Other Ambulatory Visit: Payer: Self-pay | Admitting: Family Medicine

## 2018-03-27 DIAGNOSIS — L409 Psoriasis, unspecified: Secondary | ICD-10-CM | POA: Diagnosis not present

## 2018-03-27 DIAGNOSIS — Z79899 Other long term (current) drug therapy: Secondary | ICD-10-CM | POA: Diagnosis not present

## 2018-03-27 DIAGNOSIS — Z5181 Encounter for therapeutic drug level monitoring: Secondary | ICD-10-CM | POA: Diagnosis not present

## 2018-03-27 DIAGNOSIS — Z8673 Personal history of transient ischemic attack (TIA), and cerebral infarction without residual deficits: Secondary | ICD-10-CM | POA: Diagnosis not present

## 2018-03-31 ENCOUNTER — Other Ambulatory Visit: Payer: Self-pay | Admitting: Family Medicine

## 2018-04-05 ENCOUNTER — Telehealth: Payer: Self-pay | Admitting: *Deleted

## 2018-04-05 NOTE — Telephone Encounter (Signed)
Pt called - her husband states that this would not be a good option for [name] at this time - she has difficulty communicating and she is unable to talk - especially on the phone .    They are aware of this option and if they want to initiate this in the future - we can do so.Georgina Pillion, LPN

## 2018-04-05 NOTE — Telephone Encounter (Signed)
-----   Message from Torrie Mayers, RN sent at 04/05/2018  2:04 PM EST -----  ----- Message ----- From: Thana Ates, LPN Sent: 0/05/5995   9:36 AM EST To: [facility] Clinical [facility]  Please do virtual behavioral health referral ----- Message ----- From: Marjorie Smolder, LCAS-A Sent: 04/04/2018  10:46 AM EST To: Chipper Herb, MD, Ava Delmer Islam, LCAS-A Subject: [facility] Referral                                    Dear: Dr. Laurance Flatten,   Your patient [name]( has an elevated PHQ-9 score of 20 documented in their chart.    If you want your patient to have [facility] services to address their depressive symptoms, please use the [facility] message option in Epic/CHL on your visit navigator to request our services.  If you have any questions, please call [phone].  The [facility] Team will be contacting the patient in order to introduce [facility] services.     Thank you for your time, [facility]

## 2018-04-08 ENCOUNTER — Telehealth: Payer: Self-pay

## 2018-04-08 NOTE — Telephone Encounter (Signed)
[facility] - Left Message  

## 2018-04-10 ENCOUNTER — Telehealth: Payer: Self-pay

## 2018-04-10 NOTE — Telephone Encounter (Signed)
Per Chart Review - [facility] Inactive   Pt called - her husband states that this would not be a good option for [place] at this time - she has difficulty communicating and she is unable to talk - especially on the phone .    They are aware of this option and if they want to initiate this in the future - we can do so. Per, Georgina Pillion, LPN   Writer will route this information to the PCP and Dr. Modesta Messing.

## 2018-04-12 ENCOUNTER — Other Ambulatory Visit: Payer: Self-pay | Admitting: Family Medicine

## 2018-04-24 ENCOUNTER — Telehealth: Payer: Self-pay | Admitting: Family Medicine

## 2018-04-24 NOTE — Telephone Encounter (Signed)
Called husband - she is doing better now.

## 2018-04-24 NOTE — Telephone Encounter (Signed)
Please call as requested.

## 2018-04-27 ENCOUNTER — Other Ambulatory Visit: Payer: Self-pay | Admitting: Family Medicine

## 2018-05-03 ENCOUNTER — Other Ambulatory Visit: Payer: Self-pay | Admitting: Family Medicine

## 2018-05-09 ENCOUNTER — Other Ambulatory Visit: Payer: Self-pay | Admitting: Family Medicine

## 2018-05-16 ENCOUNTER — Encounter: Payer: Medicare Other | Admitting: *Deleted

## 2018-05-16 ENCOUNTER — Other Ambulatory Visit: Payer: Self-pay

## 2018-05-17 ENCOUNTER — Other Ambulatory Visit: Payer: Self-pay | Admitting: Family Medicine

## 2018-05-17 ENCOUNTER — Telehealth: Payer: Self-pay

## 2018-05-17 NOTE — Telephone Encounter (Signed)
Left message for patient to remind of missed remote transmission.  

## 2018-05-21 ENCOUNTER — Encounter: Payer: Self-pay | Admitting: Cardiology

## 2018-05-22 ENCOUNTER — Institutional Professional Consult (permissible substitution): Payer: Medicare Other | Admitting: Neurology

## 2018-05-29 ENCOUNTER — Other Ambulatory Visit: Payer: Self-pay

## 2018-05-29 ENCOUNTER — Ambulatory Visit (INDEPENDENT_AMBULATORY_CARE_PROVIDER_SITE_OTHER): Payer: Medicare Other | Admitting: *Deleted

## 2018-05-29 DIAGNOSIS — I495 Sick sinus syndrome: Secondary | ICD-10-CM | POA: Diagnosis not present

## 2018-05-29 LAB — CUP PACEART REMOTE DEVICE CHECK
Battery Remaining Longevity: 65 mo
Battery Remaining Percentage: 51 %
Battery Voltage: 2.87 V
Brady Statistic AP VP Percent: 1 %
Brady Statistic AP VS Percent: 1 %
Brady Statistic AS VP Percent: 1 %
Brady Statistic AS VS Percent: 99 %
Brady Statistic RA Percent Paced: 1 %
Brady Statistic RV Percent Paced: 1 %
Date Time Interrogation Session: 20200407180024
Implantable Lead Implant Date: 20100630
Implantable Lead Implant Date: 20100630
Implantable Lead Location: 753859
Implantable Lead Location: 753860
Implantable Pulse Generator Implant Date: 20100630
Lead Channel Impedance Value: 350 Ohm
Lead Channel Impedance Value: 450 Ohm
Lead Channel Pacing Threshold Amplitude: 1 V
Lead Channel Pacing Threshold Amplitude: 1 V
Lead Channel Pacing Threshold Pulse Width: 0.4 ms
Lead Channel Pacing Threshold Pulse Width: 0.8 ms
Lead Channel Sensing Intrinsic Amplitude: 4.9 mV
Lead Channel Sensing Intrinsic Amplitude: 5 mV
Lead Channel Setting Pacing Amplitude: 2 V
Lead Channel Setting Pacing Amplitude: 2.5 V
Lead Channel Setting Pacing Pulse Width: 0.8 ms
Lead Channel Setting Sensing Sensitivity: 1.5 mV
Pulse Gen Model: 2110
Pulse Gen Serial Number: 2285953

## 2018-06-07 NOTE — Progress Notes (Signed)
Remote pacemaker transmission.   

## 2018-06-10 ENCOUNTER — Telehealth: Payer: Self-pay | Admitting: Family Medicine

## 2018-06-10 ENCOUNTER — Other Ambulatory Visit: Payer: Self-pay

## 2018-06-10 DIAGNOSIS — R300 Dysuria: Secondary | ICD-10-CM

## 2018-06-10 NOTE — Telephone Encounter (Signed)
Pts husband notified to bring in urine for review. Husband verbalized understanding. Orders placed for urinalysis and culture

## 2018-06-11 ENCOUNTER — Other Ambulatory Visit: Payer: Self-pay

## 2018-06-11 ENCOUNTER — Other Ambulatory Visit: Payer: Medicare Other

## 2018-06-11 DIAGNOSIS — R300 Dysuria: Secondary | ICD-10-CM

## 2018-06-11 LAB — URINALYSIS, COMPLETE
Bilirubin, UA: NEGATIVE
Glucose, UA: NEGATIVE
Ketones, UA: NEGATIVE
Leukocytes,UA: NEGATIVE
Nitrite, UA: NEGATIVE
Protein,UA: NEGATIVE
RBC, UA: NEGATIVE
Specific Gravity, UA: 1.02 (ref 1.005–1.030)
Urobilinogen, Ur: 0.2 mg/dL (ref 0.2–1.0)
pH, UA: 5.5 (ref 5.0–7.5)

## 2018-06-11 LAB — MICROSCOPIC EXAMINATION: RBC, Urine: NONE SEEN /hpf (ref 0–2)

## 2018-06-14 LAB — URINE CULTURE

## 2018-06-14 MED ORDER — CIPROFLOXACIN HCL 250 MG PO TABS: 250.0000 mg | ORAL_TABLET | Freq: Two times a day (BID) | ORAL | 0 refills | Status: DC

## 2018-06-14 NOTE — Progress Notes (Signed)
cipro called in - per verbal order from [facility] - husband aware

## 2018-06-17 ENCOUNTER — Telehealth: Payer: Self-pay | Admitting: Family Medicine

## 2018-06-17 NOTE — Telephone Encounter (Signed)
Dr Laurance Flatten, - what else can we try??

## 2018-06-18 ENCOUNTER — Encounter: Payer: Medicare Other | Admitting: *Deleted

## 2018-06-18 MED ORDER — TRIMETHOPRIM 100 MG PO TABS: 100.0000 mg | ORAL_TABLET | Freq: Two times a day (BID) | ORAL | 0 refills | Status: DC

## 2018-06-18 NOTE — Telephone Encounter (Signed)
Spoke with husband = aware of new med and she does have appt with urology on 07/05/18.

## 2018-06-18 NOTE — Telephone Encounter (Signed)
Try trimethoprim 100 mg every 12 hours for 14 days and recheck urinalysis after taking this.  If problems continue, patient will need a return appointment with urology.  I will go ahead and schedule this so she does not get better we do have an alternative instead of having to wait additional time to be seen.  Call patient's husband with this information and recommendation

## 2018-06-24 DIAGNOSIS — L409 Psoriasis, unspecified: Secondary | ICD-10-CM | POA: Diagnosis not present

## 2018-06-24 DIAGNOSIS — Z8673 Personal history of transient ischemic attack (TIA), and cerebral infarction without residual deficits: Secondary | ICD-10-CM | POA: Diagnosis not present

## 2018-07-03 ENCOUNTER — Other Ambulatory Visit: Payer: Self-pay

## 2018-07-03 ENCOUNTER — Ambulatory Visit (INDEPENDENT_AMBULATORY_CARE_PROVIDER_SITE_OTHER): Payer: Medicare Other | Admitting: Family Medicine

## 2018-07-03 ENCOUNTER — Encounter: Payer: Self-pay | Admitting: Family Medicine

## 2018-07-03 DIAGNOSIS — E119 Type 2 diabetes mellitus without complications: Secondary | ICD-10-CM | POA: Diagnosis not present

## 2018-07-03 DIAGNOSIS — I6349 Cerebral infarction due to embolism of other cerebral artery: Secondary | ICD-10-CM

## 2018-07-03 DIAGNOSIS — N390 Urinary tract infection, site not specified: Secondary | ICD-10-CM

## 2018-07-03 DIAGNOSIS — Z79899 Other long term (current) drug therapy: Secondary | ICD-10-CM

## 2018-07-03 DIAGNOSIS — I10 Essential (primary) hypertension: Secondary | ICD-10-CM | POA: Diagnosis not present

## 2018-07-03 DIAGNOSIS — I63412 Cerebral infarction due to embolism of left middle cerebral artery: Secondary | ICD-10-CM

## 2018-07-03 DIAGNOSIS — E034 Atrophy of thyroid (acquired): Secondary | ICD-10-CM

## 2018-07-03 DIAGNOSIS — I495 Sick sinus syndrome: Secondary | ICD-10-CM | POA: Diagnosis not present

## 2018-07-03 DIAGNOSIS — I6930 Unspecified sequelae of cerebral infarction: Secondary | ICD-10-CM

## 2018-07-03 DIAGNOSIS — R29898 Other symptoms and signs involving the musculoskeletal system: Secondary | ICD-10-CM | POA: Diagnosis not present

## 2018-07-03 DIAGNOSIS — K429 Umbilical hernia without obstruction or gangrene: Secondary | ICD-10-CM | POA: Diagnosis not present

## 2018-07-03 DIAGNOSIS — E559 Vitamin D deficiency, unspecified: Secondary | ICD-10-CM

## 2018-07-03 DIAGNOSIS — G8311 Monoplegia of lower limb affecting right dominant side: Secondary | ICD-10-CM

## 2018-07-03 DIAGNOSIS — I480 Paroxysmal atrial fibrillation: Secondary | ICD-10-CM | POA: Diagnosis not present

## 2018-07-03 DIAGNOSIS — E785 Hyperlipidemia, unspecified: Secondary | ICD-10-CM | POA: Diagnosis not present

## 2018-07-03 NOTE — Progress Notes (Signed)
Virtual Visit Via telephone Note I connected with@ on 07/03/18 by telephone and verified that I am speaking with the correct person or authorized healthcare agent using two identifiers. [name] is currently located at home and there are no unauthorized people in close proximity. I completed this visit while in a private location in my home .  The patient's husband body was present and was responsible for relaying my questions to the patient and her responding appropriately.  This visit type was conducted due to national recommendations for restrictions regarding the COVID-19 Pandemic (e.g. social distancing).  This format is felt to be most appropriate for this patient at this time.  All issues noted in this document were discussed and addressed.  No physical exam was performed.    I discussed the limitations, risks, security and privacy concerns of performing an evaluation and management service by telephone and the availability of in person appointments. I also discussed with the patient that there may be a patient responsible charge related to this service. The patient expressed understanding and agreed to proceed.   Date:  07/03/2018    ID:  [name]      1945/02/25        [id number]   Patient Care Team Patient Care Team: Chipper Herb, MD as PCP - General (Family Medicine) Evans Lance, MD (Cardiology) Thompson Grayer, MD (Cardiology) Sable Feil, MD (Gastroenterology) Michael Boston, MD as Consulting Physician (General Surgery) Rosalin Hawking, MD as Consulting Physician (Neurology)  Reason for Visit: Primary Care Follow-up     History of Present Illness & Review of Systems:     [name] is a 73 y.o. year old female primary care patient that presents today for a telehealth visit.  The patient is at home with her husband and we had a long conversation lasting 46 minutes on the phone.  He does an excellent job looking after his wife.  She has had multiple  issues.  She has had a stroke in the past and is able to walk with paralysis but is able to walk.  She sees multiple specialist including the cardiologist the urologist the ophthalmologist the surgeon the rheumatologist and the cardiologist.  She has not seen the cardiologist in a good while and the family will arrange for at least a telephone visit.  She is to see the urologist in 2 days because of chronic urinary tract infections resistant or allergic to any medicines that can be used to treat these.  She is currently taking trimethoprim.  She also has macular degeneration and I reminded the patient and her husband that she needs to go back at least yearly or more often because of the macular degeneration.  She also has an abdominal wall hernia but cannot be operated on for this until she gets the chronic urinary tract infections cleared up.  The patient says she has a decreased appetite and has not been gaining weight.  She does have some chest discomfort not related to activity.  She does not complain of any shortness of breath anymore than usual.  Her intestinal history is stable and she does have periodic loose bowel movements and has Imodium to take for this also takes a daily probiotic.  She sees the urologist on Friday of this week in [place].  She has right-sided abdominal pain and is soon she can get her urology infection straightened out the husband will call the surgeon, Dr. gross to  set up a visit for possible surgery on the hernia that she has in her abdomen.  The patient is reluctant to have the surgery.  She is due to get blood work and we will also get a chest x-ray at the same time and schedule a visit with the clinical pharmacist for polypharmacy issues.  Review of systems as stated, otherwise negative.  The patient does not have symptoms concerning for COVID-19 infection (fever, chills, cough, or new shortness of breath).      Current Medications (Verified) Allergies as of 07/03/2018        Reactions   Latex Rash   Caudal Tray [lidocaine-epinephrine]    Edema    Ciprofloxacin Hcl Nausea Only   Codeine Nausea Only   Diovan [valsartan] Itching   Doxycycline Other (See Comments)   Might have caused heart to race per pt - not sure   Esomeprazole Magnesium    Headache    Levofloxacin    Insomnia    Myrbetriq [mirabegron] Diarrhea   Neomycin-bacitracin Zn-polymyx    rash   Penicillins    hives   Sulfa Drugs Cross Reactors Other (See Comments)   Pt not sure what reaction was   Verapamil    Edema   Vimovo [naproxen-esomeprazole]    Upset stomach      Medication List       Accurate as of Jul 03, 2018 71:22 PM. If you have any questions, ask your nurse or doctor.        acetaminophen 500 MG tablet Commonly known as:  TYLENOL Take 500 mg by mouth every 6 (six) hours as needed (pain).   calcium-vitamin D 500-200 MG-UNIT tablet Take 1 tablet by mouth daily.   cholecalciferol 1000 units tablet Commonly known as:  VITAMIN D Take 2,000 Units by mouth daily.   ciprofloxacin 250 MG tablet Commonly known as:  Cipro Take 1 tablet (250 mg total) by mouth 2 (two) times daily.   citalopram 20 MG tablet Commonly known as:  CELEXA TAKE 1 TABLET(20 MG) BY MOUTH TWICE DAILY   cyclobenzaprine 5 MG tablet Commonly known as:  FLEXERIL Take 1 tablet (5 mg total) by mouth 3 (three) times daily as needed for muscle spasms.   dicyclomine 10 MG capsule Commonly known as:  BENTYL TAKE 1 CAPSULE BY MOUTH FOUR TIMES DAILY BEFORE MEALS AND AT BEDTIME   EYE VITAMINS PO Take 2 tablets by mouth daily. AREDS formula   fluticasone 50 MCG/ACT nasal spray Commonly known as:  FLONASE SHAKE LIQUID AND USE 2 SPRAYS IN EACH NOSTRIL DAILY   folic acid 1 MG tablet Commonly known as:  FOLVITE TAKE 1 TABLET(1 MG) BY MOUTH DAILY   furosemide 20 MG tablet Commonly known as:  LASIX Take 1 tablet (20 mg total) by mouth daily.   glimepiride 2 MG tablet Commonly known as:   AMARYL TAKE 1 TABLET(2 MG) BY MOUTH DAILY BEFORE BREAKFAST   glucose blood test strip TEST SUGAR DAILY   levETIRAcetam 500 MG tablet Commonly known as:  KEPPRA Take 1 tablet (500 mg total) by mouth 2 (two) times daily.   levothyroxine 88 MCG tablet Commonly known as:  SYNTHROID TAKE 1 TABLET BY MOUTH EVERY DAY BEFORE BREAKFAST   loperamide 2 MG capsule Commonly known as:  IMODIUM Take by mouth as needed for diarrhea or loose stools (Take as directed). Reported on 05/17/2015   metFORMIN 500 MG 24 hr tablet Commonly known as:  GLUCOPHAGE-XR Take 2 tablets (1,000 mg total) by  mouth 2 (two) times daily after a meal.   methotrexate 2.5 MG tablet Commonly known as:  RHEUMATREX Take 4 tablets (10 mg total) by mouth once a week. Caution:Chemotherapy. Protect from light.   metoprolol tartrate 50 MG tablet Commonly known as:  LOPRESSOR TAKE 1 TABLET BY MOUTH TWICE DAILY   mirtazapine 15 MG tablet Commonly known as:  REMERON TAKE 1 TABLET(15 MG) BY MOUTH AT BEDTIME   nystatin powder Generic drug:  nystatin APPLY TWICE DAILY UNDERNEATH BREASTS   omeprazole 40 MG capsule Commonly known as:  PRILOSEC TAKE 1 CAPSULE BY MOUTH EVERY DAY   phenazopyridine 200 MG tablet Commonly known as:  Pyridium Take 1 tablet (200 mg total) by mouth 3 (three) times daily as needed for pain ((burning urination)).   potassium chloride SA 20 MEQ tablet Commonly known as:  K-DUR TAKE 2 TABLETS BY MOUTH TWICE DAILY   PROBIOTIC PO Take 1 capsule by mouth daily.   simvastatin 40 MG tablet Commonly known as:  ZOCOR TAKE 1 TABLET BY MOUTH EVERY DAY   traMADol 50 MG tablet Commonly known as:  ULTRAM Take 1/2 tab by mouth four times daily as needed for severe pain   trimethoprim 100 MG tablet Commonly known as:  TRIMPEX Take 1 tablet (100 mg total) by mouth 2 (two) times daily.   VITAMIN C PO Take 1 tablet by mouth daily.   Xarelto 20 MG Tabs tablet Generic drug:  rivaroxaban TAKE 1 TABLET  BY MOUTH EVERY DAY           Allergies (Verified)    Latex; Caudal tray [lidocaine-epinephrine]; Ciprofloxacin hcl; Codeine; Diovan [valsartan]; Doxycycline; Esomeprazole magnesium; Levofloxacin; Myrbetriq [mirabegron]; Neomycin-bacitracin zn-polymyx; Penicillins; Sulfa drugs cross reactors; Verapamil; and Vimovo [naproxen-esomeprazole]  Past Medical History Past Medical History:  Diagnosis Date   Anemia    Bronchial spasms    Chronic anxiety    Chronic kidney disease    Complication of anesthesia    " I shake real bad "   Diverticulosis    Elevated blood sugar    Esophageal stricture    Family history of anesthesia complication    Daughter also shakes while waking up   GERD (gastroesophageal reflux disease)    H/O hiatal hernia    H/O scarlet fever    History of bladder repair surgery    Hx of vaginal hysterectomy    Hypertension    Hypothyroidism    IBS (irritable bowel syndrome)    vs diarrhea vs abd. fullness    Impaired glucose tolerance    Neuromuscular disorder (HCC)    periferal neuropathy   Pacemaker    St. Jude   Paroxysmal atrial fibrillation ([place])    a. failed flecainide, tikosyn, amio;  b. 01/2012 s/p RFCA.   Psoriasis    Psoriasis    Rectocele, female    Seizures ([place])    Stroke ([place])    Tachy-brady syndrome ([place])    a. 08/19/2008 s/p PPM: SJM 2110 [employer]   Uterine prolapse    Vaginal prolapse 1998     Past Surgical History:  Procedure Laterality Date   ATRIAL FIBRILLATION ABLATION  02/06/12   PVI by Dr Rayann Heman   ATRIAL FIBRILLATION ABLATION  07/04/2012   repeat PVI by Dr Rayann Heman   ATRIAL FIBRILLATION ABLATION N/A 02/06/2012   Procedure: ATRIAL FIBRILLATION ABLATION;  Surgeon: Thompson Grayer, MD;  Location: [facility] CATH LAB;  Service: Cardiovascular;  Laterality: N/A;   ATRIAL FIBRILLATION ABLATION N/A 07/04/2012   Procedure: ATRIAL FIBRILLATION  ABLATION;  Surgeon: Thompson Grayer, MD;  Location: [facility] CATH LAB;  Service:  Cardiovascular;  Laterality: N/A;   BLADDER SUSPENSION     INSERT / REPLACE / REMOVE PACEMAKER     SJM   LEFT HEART CATHETERIZATION WITH CORONARY ANGIOGRAM N/A 03/24/2013   Procedure: LEFT HEART CATHETERIZATION WITH CORONARY ANGIOGRAM;  Surgeon: Blane Ohara, MD;  Location: [facility] CATH LAB;  Service: Cardiovascular;  Laterality: N/A;   RECTOCELE REPAIR     TEE WITHOUT CARDIOVERSION  02/06/2012   Procedure: TRANSESOPHAGEAL ECHOCARDIOGRAM (TEE);  Surgeon: Thayer Headings, MD;  Location: [place];  Service: Cardiovascular;  Laterality: N/A;   TEE WITHOUT CARDIOVERSION N/A 07/04/2012   Procedure: TRANSESOPHAGEAL ECHOCARDIOGRAM (TEE);  Surgeon: Lelon Perla, MD;  Location: [facility] ENDOSCOPY;  Service: Cardiovascular;  Laterality: N/A;   TRANSTHORACIC ECHOCARDIOGRAM  2008   VAGINAL HYSTERECTOMY     prolapse     Social History   Socioeconomic History   Marital status: Married    Spouse name: [name]"   Number of children: 2   Years of education: 14 yrs   Highest education level: Associate degree: academic program  Occupational History   Occupation: Retired    Fish farm manager: [employer]  Social Designer, fashion/clothing strain: Not hard at all   Food insecurity:    Worry: Never true    Inability: Never true   Transportation needs:    Medical: No    Non-medical: No  Tobacco Use   Smoking status: Never Smoker   Smokeless tobacco: Never Used  Substance and Sexual Activity   Alcohol use: No   Drug use: No   Sexual activity: Yes  Lifestyle   Physical activity:    Days per week: 0 days    Minutes per session: 0 min   Stress: To some extent  Relationships   Social connections:    Talks on phone: More than three times a week    Gets together: More than three times a week    Attends religious service: Never    Active member of club or organization: No    Attends meetings of clubs or organizations: Never    Relationship status: Married  Other  Topics Concern   Not on file  Social History Narrative   Lives in [place].      Left-handed   Caffeine use: drinks coffee/tea: 2 cups coffee in the morning, drinks 1 glass tea per day      Family History  Problem Relation Age of Onset   Diabetes Mother    Heart disease Mother    Heart failure Mother    Macular degeneration Mother    Heart disease Father    Breast cancer Sister    Ovarian cancer Sister    Uterine cancer Paternal Aunt    Crohn's disease Other        neice   Diabetes Maternal Grandmother    Diabetes Sister    Heart disease Brother    Heart disease Brother    Macular degeneration Brother    Colon cancer Neg Hx    Stomach cancer Neg Hx       Labs/Other Tests and Data Reviewed:    Wt Readings from Last 3 Encounters:  02/27/18 169 lb (76.7 kg)  10/17/17 177 lb (80.3 kg)  09/21/17 177 lb (80.3 kg)   Temp Readings from Last 3 Encounters:  02/27/18 (!) 97.3 F (36.3 C) (Oral)  10/17/17 97.7 F (36.5 C) (Oral)  09/21/17 98.8  F (37.1 C) (Oral)   BP Readings from Last 3 Encounters:  02/27/18 126/65  10/17/17 138/72  09/21/17 138/71   Pulse Readings from Last 3 Encounters:  02/27/18 71  10/17/17 68  09/21/17 73     Lab Results  Component Value Date   HGBA1C 9.6 (H) 10/17/2017   HGBA1C 7.7 (H) 04/26/2017   HGBA1C 7.6 (H) 12/21/2016   Lab Results  Component Value Date   MICROALBUR 50 07/17/2014   LDLCALC 44 10/17/2017   CREATININE 0.56 (L) 10/17/2017       Chemistry      Component Value Date/Time   NA 141 10/17/2017 1637   K 4.8 10/17/2017 1637   CL 101 10/17/2017 1637   CO2 26 10/17/2017 1637   BUN 8 10/17/2017 1637   CREATININE 0.56 (L) 10/17/2017 1637   CREATININE 0.63 06/27/2012 1003      Component Value Date/Time   CALCIUM 9.9 10/17/2017 1637   ALKPHOS 65 10/17/2017 1637   AST 28 10/17/2017 1637   ALT 13 10/17/2017 1637   BILITOT 0.3 10/17/2017 1637         OBSERVATIONS/ OBJECTIVE:     The  patient checks her blood pressure and blood sugars at home but has no readings for [place] today.  The weight is down somewhat.  This is according to the patient.  Generally speaking the blood sugars do run between 140 and 180.  She had one this morning that was 200.  She complains of generalized weakness.  She is having recurrent urinary tract infections.  She does take a lot of medication.  I am concerned that some of the medicines could be playing a role with her lack of appetite.  And possibly her weight loss.  Physical exam deferred due to nature of telephonic visit.  ASSESSMENT & PLAN    Time:   Today, I have spent 46 minutes with the patient via telephone discussing the above including Covid precautions.     Visit Diagnoses: 1. Hyperlipidemia, unspecified hyperlipidemia type -Continue current treatment pending results of lab work  2. Vitamin D deficiency -Continue vitamin D replacement pending results of lab work  3. Hypothyroidism due to acquired atrophy of thyroid -Continue current thyroid treatment pending results of lab work  4. Cerebral infarction due to embolism of other cerebral artery [facility]) -Follow-up with neurology as planned  5. Paralysis of right lower extremity (HCC) -Follow-up with neurology as needed  6. Essential hypertension -Continue to monitor blood pressures at home and report these to the neurologist and the cardiologist and the primary care physician.  7. Type 2 diabetes mellitus without complication, without long-term current use of insulin ([place]) -Continue current diabetic treatment and as aggressive therapeutic lifestyle changes as possible pending results of lab work  8. Umbilical hernia without obstruction and without gangrene -She is continues to have some right lower quadrant abdominal pain and once her urinary tract infection is under control the husband will call and make arrangements to follow-up again with Dr. gross about possible surgery on  this.  9. Recurrent urinary tract infection -Dr. Jeffie Pollock this Friday  10. Left leg weakness -This is residual of the CVA that she has had several years ago.  11. Paroxysmal atrial fibrillation ([place]) -Follow-up with Dr. Lovena Le as planned.  Patient's husband will call and make arrangements to have a visit with him.  12. Tachy-brady syndrome ([place]) -Follow-up with cardiology as planned  13. Cerebrovascular accident (CVA) due to embolism of left middle cerebral artery (  [place]) -Follow-up with neurology as planned  14. High risk medication use -See clinical pharmacy  15.  Macular degeneration -Follow-up with ophthalmology regularly  16.  Psoriasis -Follow-up with rheumatology as planned  Patient Instructions  Continue to practice good hand and respiratory hygiene Continue to follow-up with specialist including Dr. Lovena Le for cardiology Dr. Lyman Speller for rheumatology and specialist for urology and neurology.  Also do not forget to call general surgeon when the coronavirus pandemic is settled down and continue to manage the abdominal hernia so that your abdominal pain can be relieved Continue to drink plenty of water and fluids Continue to stay as active as possible but at the same time being careful not to put yourself at risk for falling Continue to watch diet closely       The above assessment and management plan was discussed with the patient. The patient verbalized understanding of and has agreed to the management plan. Patient is aware to call the clinic if symptoms persist or worsen. Patient is aware when to return to the clinic for a follow-up visit. Patient educated on when it is appropriate to go to the emergency department.    Chipper Herb, MD [address], [place], [place] [address] Ph [phone]   Arrie Senate MD

## 2018-07-03 NOTE — Addendum Note (Signed)
Addended by: Zannie Cove on: 07/03/2018 02:22 PM   Modules accepted: Orders

## 2018-07-03 NOTE — Patient Instructions (Signed)
Continue to practice good hand and respiratory hygiene Continue to follow-up with specialist including Dr. Lovena Le for cardiology Dr. Lyman Speller for rheumatology and specialist for urology and neurology.  Also do not forget to call general surgeon when the coronavirus pandemic is settled down and continue to manage the abdominal hernia so that your abdominal pain can be relieved Continue to drink plenty of water and fluids Continue to stay as active as possible but at the same time being careful not to put yourself at risk for falling Continue to watch diet closely

## 2018-07-10 ENCOUNTER — Other Ambulatory Visit: Payer: Self-pay

## 2018-07-10 ENCOUNTER — Telehealth (INDEPENDENT_AMBULATORY_CARE_PROVIDER_SITE_OTHER): Payer: Medicare Other | Admitting: Internal Medicine

## 2018-07-10 DIAGNOSIS — Z8673 Personal history of transient ischemic attack (TIA), and cerebral infarction without residual deficits: Secondary | ICD-10-CM | POA: Diagnosis not present

## 2018-07-10 DIAGNOSIS — I480 Paroxysmal atrial fibrillation: Secondary | ICD-10-CM

## 2018-07-10 DIAGNOSIS — I509 Heart failure, unspecified: Secondary | ICD-10-CM | POA: Diagnosis not present

## 2018-07-10 NOTE — Progress Notes (Signed)
Electrophysiology TeleHealth Note   Due to national recommendations of social distancing due to COVID 19, an audio/video telehealth visit is felt to be most appropriate for this patient at this time.  See MyChart message from today for the patient's consent to telehealth for [facility].   Date:  07/10/2018   ID:  [name], [name] 07-27-1945, MRN [id number]  Location: patient's home  Provider location: [address] [place]  Evaluation Performed: Follow-up visit  PCP:  Chipper Herb, MD  Cardiologist:  No primary care provider on file.  Electrophysiologist:  Dr Lovena Le  Chief Complaint:  "I been doing alright."  History of Present Illness:    CLAUDEAN LEAVELLE is a 73 y.o. female who presents via audio/video conferencing for a telehealth visit today.She has a h/o PAF and is s/p ablation. She underwent repeat catheter ablation and then a convergence procedure which was complicated by stroke and she has a residual HP and difficulty swallowing. She has some dyspnea on exertion.  Today, she denies symptoms of palpitations, chest pain, shortness of breath,  lower extremity edema, dizziness, presyncope, or syncope.  The patient is otherwise without complaint today.  The patient denies symptoms of fevers, chills, cough, or new SOB worrisome for COVID 19.  Past Medical History:  Diagnosis Date  . Anemia   . Bronchial spasms   . Chronic anxiety   . Chronic kidney disease   . Complication of anesthesia    " I shake real bad "  . Diverticulosis   . Elevated blood sugar   . Esophageal stricture   . Family history of anesthesia complication    Daughter also shakes while waking up  . GERD (gastroesophageal reflux disease)   . H/O hiatal hernia   . H/O scarlet fever   . History of bladder repair surgery   . Hx of vaginal hysterectomy   . Hypertension   . Hypothyroidism   . IBS (irritable bowel syndrome)    vs diarrhea vs abd. fullness   . Impaired glucose tolerance    . Neuromuscular disorder ([place])    periferal neuropathy  . Pacemaker    St. Jude  . Paroxysmal atrial fibrillation (HCC)    a. failed flecainide, tikosyn, amio;  b. 01/2012 s/p RFCA.  . Psoriasis   . Psoriasis   . Rectocele, female   . Seizures ([place])   . Stroke ([place])   . Tachy-brady syndrome ([place])    a. 08/19/2008 s/p PPM: SJM 2110 Accent  . Uterine prolapse   . Vaginal prolapse 1998    Past Surgical History:  Procedure Laterality Date  . ATRIAL FIBRILLATION ABLATION  02/06/12   PVI by Dr Rayann Heman  . ATRIAL FIBRILLATION ABLATION  07/04/2012   repeat PVI by Dr Rayann Heman  . ATRIAL FIBRILLATION ABLATION N/A 02/06/2012   Procedure: ATRIAL FIBRILLATION ABLATION;  Surgeon: Thompson Grayer, MD;  Location: [facility] CATH LAB;  Service: Cardiovascular;  Laterality: N/A;  . ATRIAL FIBRILLATION ABLATION N/A 07/04/2012   Procedure: ATRIAL FIBRILLATION ABLATION;  Surgeon: Thompson Grayer, MD;  Location: [facility] CATH LAB;  Service: Cardiovascular;  Laterality: N/A;  . BLADDER SUSPENSION    . INSERT / REPLACE / REMOVE PACEMAKER     SJM  . LEFT HEART CATHETERIZATION WITH CORONARY ANGIOGRAM N/A 03/24/2013   Procedure: LEFT HEART CATHETERIZATION WITH CORONARY ANGIOGRAM;  Surgeon: Blane Ohara, MD;  Location: [facility] CATH LAB;  Service: Cardiovascular;  Laterality: N/A;  . RECTOCELE REPAIR    .  TEE WITHOUT CARDIOVERSION  02/06/2012   Procedure: TRANSESOPHAGEAL ECHOCARDIOGRAM (TEE);  Surgeon: Thayer Headings, MD;  Location: [facility];  Service: Cardiovascular;  Laterality: N/A;  . TEE WITHOUT CARDIOVERSION N/A 07/04/2012   Procedure: TRANSESOPHAGEAL ECHOCARDIOGRAM (TEE);  Surgeon: Lelon Perla, MD;  Location: [facility] ENDOSCOPY;  Service: Cardiovascular;  Laterality: N/A;  . TRANSTHORACIC ECHOCARDIOGRAM  2008  . VAGINAL HYSTERECTOMY     prolapse     Current Outpatient Medications  Medication Sig Dispense Refill  . acetaminophen (TYLENOL) 500 MG tablet Take 500 mg by mouth every 6 (six) hours as needed (pain).     .  Ascorbic Acid (VITAMIN C PO) Take 1 tablet by mouth daily.    . Calcium Carbonate-Vitamin D (CALCIUM-VITAMIN D) 500-200 MG-UNIT per tablet Take 1 tablet by mouth daily.    . cholecalciferol (VITAMIN D) 1000 UNITS tablet Take 2,000 Units by mouth daily.     . citalopram (CELEXA) 20 MG tablet TAKE 1 TABLET(20 MG) BY MOUTH TWICE DAILY 180 tablet 0  . cyclobenzaprine (FLEXERIL) 5 MG tablet Take 1 tablet (5 mg total) by mouth 3 (three) times daily as needed for muscle spasms. 30 tablet 0  . dicyclomine (BENTYL) 10 MG capsule TAKE 1 CAPSULE BY MOUTH FOUR TIMES DAILY BEFORE MEALS AND AT BEDTIME 360 capsule 3  . fluticasone (FLONASE) 50 MCG/ACT nasal spray SHAKE LIQUID AND USE 2 SPRAYS IN EACH NOSTRIL DAILY 48 g 1  . folic acid (FOLVITE) 1 MG tablet TAKE 1 TABLET(1 MG) BY MOUTH DAILY (Patient taking differently: 2 mg. ) 90 tablet 0  . furosemide (LASIX) 20 MG tablet Take 1 tablet (20 mg total) by mouth daily. 90 tablet 3  . glimepiride (AMARYL) 2 MG tablet TAKE 1 TABLET(2 MG) BY MOUTH DAILY BEFORE BREAKFAST 90 tablet 0  . glucose blood test strip TEST SUGAR DAILY 100 each 3  . levETIRAcetam (KEPPRA) 500 MG tablet Take 1 tablet (500 mg total) by mouth 2 (two) times daily. 180 tablet 4  . levothyroxine (SYNTHROID, LEVOTHROID) 88 MCG tablet TAKE 1 TABLET BY MOUTH EVERY DAY BEFORE BREAKFAST 90 tablet 2  . loperamide (IMODIUM) 2 MG capsule Take by mouth as needed for diarrhea or loose stools (Take as directed). Reported on 05/17/2015    . metFORMIN (GLUCOPHAGE-XR) 500 MG 24 hr tablet Take 2 tablets (1,000 mg total) by mouth 2 (two) times daily after a meal. (Patient taking differently: Take 1,000 mg by mouth 2 (two) times daily after a meal. Takes 1 (500) in the AM and 2 (1000)  in the PM) 360 tablet 3  . methotrexate (RHEUMATREX) 2.5 MG tablet Take 4 tablets (10 mg total) by mouth once a week. Caution:Chemotherapy. Protect from light. 16 tablet 1  . metoprolol tartrate (LOPRESSOR) 50 MG tablet TAKE 1 TABLET BY  MOUTH TWICE DAILY 180 tablet 3  . mirtazapine (REMERON) 15 MG tablet TAKE 1 TABLET(15 MG) BY MOUTH AT BEDTIME 90 tablet 0  . Multiple Vitamins-Minerals (EYE VITAMINS PO) Take 2 tablets by mouth daily. AREDS formula    . NYSTATIN powder APPLY TWICE DAILY UNDERNEATH BREASTS 60 g 0  . omeprazole (PRILOSEC) 40 MG capsule TAKE 1 CAPSULE BY MOUTH EVERY DAY 90 capsule 1  . phenazopyridine (PYRIDIUM) 200 MG tablet Take 1 tablet (200 mg total) by mouth 3 (three) times daily as needed for pain ((burning urination)). (Patient not taking: Reported on 07/03/2018) 9 tablet 0  . potassium chloride SA (K-DUR,KLOR-CON) 20 MEQ tablet TAKE 2 TABLETS BY MOUTH TWICE DAILY  360 tablet 1  . Probiotic Product (PROBIOTIC PO) Take 1 capsule by mouth daily.    . simvastatin (ZOCOR) 40 MG tablet TAKE 1 TABLET BY MOUTH EVERY DAY 90 tablet 1  . traMADol (ULTRAM) 50 MG tablet Take 1/2 tab by mouth four times daily as needed for severe pain    . trimethoprim (TRIMPEX) 100 MG tablet Take 1 tablet (100 mg total) by mouth 2 (two) times daily. 28 tablet 0  . XARELTO 20 MG TABS tablet TAKE 1 TABLET BY MOUTH EVERY DAY 90 tablet 0   No current facility-administered medications for this visit.     Allergies:   Latex; Caudal tray [lidocaine-epinephrine]; Ciprofloxacin hcl; Codeine; Diovan [valsartan]; Doxycycline; Esomeprazole magnesium; Levofloxacin; Myrbetriq [mirabegron]; Neomycin-bacitracin zn-polymyx; Penicillins; Sulfa drugs cross reactors; Verapamil; and Vimovo [naproxen-esomeprazole]   Social History:  The patient  reports that she has never smoked. She has never used smokeless tobacco. She reports that she does not drink alcohol or use drugs.   Family History:  The patient's  family history includes Breast cancer in her sister; Crohn's disease in an other family member; Diabetes in her maternal grandmother, mother, and sister; Heart disease in her brother, brother, father, and mother; Heart failure in her mother; Macular  degeneration in her brother and mother; Ovarian cancer in her sister; Uterine cancer in her paternal aunt.   ROS:  Please see the history of present illness.   All other systems are personally reviewed and negative.    Exam:    Vital Signs:  127/67, P - 63, CBG - High  Well appearing, alert and conversant, regular work of breathing,  good skin color. Difficulty with speech. Eyes- anicteric, neuro- grossly intact, skin- no apparent rash or lesions or cyanosis, mouth- oral mucosa is pink   Labs/Other Tests and Data Reviewed:    Recent Labs: 10/17/2017: ALT 13; BUN 8; Creatinine, Ser 0.56; Hemoglobin 13.0; Platelets 221; Potassium 4.8; Sodium 141; TSH 2.180   Wt Readings from Last 3 Encounters:  02/27/18 169 lb (76.7 kg)  10/17/17 177 lb (80.3 kg)  09/21/17 177 lb (80.3 kg)     Other studies personally reviewed: Additional studies/ records that were reviewed today include:   Last device remote is reviewed from [facility] PDF dated 05/28/18 which reveals normal device function, no arrhythmias   ASSESSMENT & PLAN:    1.  PAf - she appears to be maintaining NSR.  2. CHF - she feels poorly on furosemide. She will reduce the dose to as needed of her furosemide. 3. Stroke - she is much improved. She will continue her current meds. 4. COVID 19 screen The patient denies symptoms of COVID 19 at this time.  The importance of social distancing was discussed today.  Follow-up:  6 months Next remote: 7/20  Current medicines are reviewed at length with the patient today.   The patient does not have concerns regarding her medicines.  The following changes were made today:  none  Labs/ tests ordered today include: none No orders of the defined types were placed in this encounter.    Patient Risk:  after full review of this patients clinical status, I feel that they are at moderate risk at this time.  Today, I have spent 15 minutes with the patient with telehealth technology discussing all  of the above.    Signed, Cristopher Peru, MD  07/10/2018 2:22 PM     [facility] [address] [place] [address] [phone] (office) [phone] (  fax)

## 2018-07-11 ENCOUNTER — Other Ambulatory Visit: Payer: Self-pay | Admitting: Family Medicine

## 2018-07-21 ENCOUNTER — Other Ambulatory Visit: Payer: Self-pay | Admitting: Family Medicine

## 2018-07-24 ENCOUNTER — Other Ambulatory Visit: Payer: Self-pay | Admitting: Family Medicine

## 2018-07-30 ENCOUNTER — Telehealth: Payer: Self-pay | Admitting: Family Medicine

## 2018-07-30 ENCOUNTER — Other Ambulatory Visit: Payer: Self-pay

## 2018-07-30 NOTE — Telephone Encounter (Signed)
No other symptoms. Patient OK

## 2018-07-31 ENCOUNTER — Ambulatory Visit (INDEPENDENT_AMBULATORY_CARE_PROVIDER_SITE_OTHER): Payer: Medicare Other | Admitting: Family Medicine

## 2018-07-31 ENCOUNTER — Ambulatory Visit: Payer: Medicare Other | Admitting: Pharmacist Clinician (PhC)/ Clinical Pharmacy Specialist

## 2018-07-31 ENCOUNTER — Encounter: Payer: Self-pay | Admitting: Family Medicine

## 2018-07-31 DIAGNOSIS — I10 Essential (primary) hypertension: Secondary | ICD-10-CM

## 2018-07-31 DIAGNOSIS — I480 Paroxysmal atrial fibrillation: Secondary | ICD-10-CM | POA: Diagnosis not present

## 2018-07-31 DIAGNOSIS — R300 Dysuria: Secondary | ICD-10-CM

## 2018-07-31 DIAGNOSIS — N3090 Cystitis, unspecified without hematuria: Secondary | ICD-10-CM

## 2018-07-31 DIAGNOSIS — I6930 Unspecified sequelae of cerebral infarction: Secondary | ICD-10-CM | POA: Diagnosis not present

## 2018-07-31 DIAGNOSIS — F4321 Adjustment disorder with depressed mood: Secondary | ICD-10-CM | POA: Diagnosis not present

## 2018-07-31 DIAGNOSIS — E119 Type 2 diabetes mellitus without complications: Secondary | ICD-10-CM | POA: Diagnosis not present

## 2018-07-31 DIAGNOSIS — I63412 Cerebral infarction due to embolism of left middle cerebral artery: Secondary | ICD-10-CM

## 2018-07-31 MED ORDER — CIPROFLOXACIN HCL 500 MG PO TABS: 500.0000 mg | ORAL_TABLET | Freq: Two times a day (BID) | ORAL | 0 refills | Status: AC

## 2018-07-31 NOTE — Progress Notes (Signed)
Virtual Visit via telephone Note Due to COVID-19, visit is conducted virtually and was requested by patient. This visit type was conducted due to national recommendations for restrictions regarding the COVID-19 Pandemic (e.g. social distancing) in an effort to limit this patient's exposure and mitigate transmission in our community. All issues noted in this document were discussed and addressed.  A physical exam was not performed with this format.   I connected with [name] and her husband on 07/31/18 at [place] by telephone and verified that I am speaking with the correct person using two identifiers. [name] is currently located at home and family is currently with them during visit. The provider, Monia Pouch, FNP is located in their office at time of visit.  I discussed the limitations, risks, security and privacy concerns of performing an evaluation and management service by telephone and the availability of in person appointments. I also discussed with the patient that there may be a patient responsible charge related to this service. The patient expressed understanding and agreed to proceed.  Subjective:  Patient ID: [name], female    DOB: 1945/09/15, 73 y.o.   MRN: [id number]  Chief Complaint:  Dysuria and Fatigue   HPI: [name] is a 73 y.o. female presenting on 07/31/2018 for Dysuria and Fatigue   Husband reports pt has recurrent dysuria, frequency, and urgency. States she has this often due to incontinence and use of incontinence briefs. States she is on preventative Keflex but continues to get infections. States she has had ongoing symptoms for a while. States after the last course of antibiotics she remained symptom free for about 2 weeks. States she has seen urology and has a follow up appointment in August. States she is just not feeling well. States she has general weakness and fatigue. States she lost her brother a week ago and is still grieving from this as  well. No fever, chills, or confusion.     Relevant past medical, surgical, family, and social history reviewed and updated as indicated.  Allergies and medications reviewed and updated.   Past Medical History:  Diagnosis Date   Anemia    Bronchial spasms    Chronic anxiety    Chronic kidney disease    Complication of anesthesia    " I shake real bad "   Diverticulosis    Elevated blood sugar    Esophageal stricture    Family history of anesthesia complication    Daughter also shakes while waking up   GERD (gastroesophageal reflux disease)    H/O hiatal hernia    H/O scarlet fever    History of bladder repair surgery    Hx of vaginal hysterectomy    Hypertension    Hypothyroidism    IBS (irritable bowel syndrome)    vs diarrhea vs abd. fullness    Impaired glucose tolerance    Neuromuscular disorder (HCC)    periferal neuropathy   Pacemaker    St. Jude   Paroxysmal atrial fibrillation ([place])    a. failed flecainide, tikosyn, amio;  b. 01/2012 s/p RFCA.   Psoriasis    Psoriasis    Rectocele, female    Seizures ([place])    Stroke ([place])    Tachy-brady syndrome ([place])    a. 08/19/2008 s/p PPM: SJM 2110 Accent   Uterine prolapse    Vaginal prolapse 1998    Past Surgical History:  Procedure Laterality Date   ATRIAL FIBRILLATION ABLATION  02/06/12   PVI  by Dr Rayann Heman   ATRIAL FIBRILLATION ABLATION  07/04/2012   repeat PVI by Dr Rayann Heman   ATRIAL FIBRILLATION ABLATION N/A 02/06/2012   Procedure: ATRIAL FIBRILLATION ABLATION;  Surgeon: Thompson Grayer, MD;  Location: [facility] CATH LAB;  Service: Cardiovascular;  Laterality: N/A;   ATRIAL FIBRILLATION ABLATION N/A 07/04/2012   Procedure: ATRIAL FIBRILLATION ABLATION;  Surgeon: Thompson Grayer, MD;  Location: [facility] CATH LAB;  Service: Cardiovascular;  Laterality: N/A;   BLADDER SUSPENSION     INSERT / REPLACE / REMOVE PACEMAKER     SJM   LEFT HEART CATHETERIZATION WITH CORONARY ANGIOGRAM N/A 03/24/2013    Procedure: LEFT HEART CATHETERIZATION WITH CORONARY ANGIOGRAM;  Surgeon: Blane Ohara, MD;  Location: [facility] CATH LAB;  Service: Cardiovascular;  Laterality: N/A;   RECTOCELE REPAIR     TEE WITHOUT CARDIOVERSION  02/06/2012   Procedure: TRANSESOPHAGEAL ECHOCARDIOGRAM (TEE);  Surgeon: Thayer Headings, MD;  Location: [facility];  Service: Cardiovascular;  Laterality: N/A;   TEE WITHOUT CARDIOVERSION N/A 07/04/2012   Procedure: TRANSESOPHAGEAL ECHOCARDIOGRAM (TEE);  Surgeon: Lelon Perla, MD;  Location: [facility] ENDOSCOPY;  Service: Cardiovascular;  Laterality: N/A;   TRANSTHORACIC ECHOCARDIOGRAM  2008   VAGINAL HYSTERECTOMY     prolapse     Social History   Socioeconomic History   Marital status: Married    Spouse name: [name]"   Number of children: 2   Years of education: 14 yrs   Highest education level: Associate degree: academic program  Occupational History   Occupation: Retired    Fish farm manager: [employer]  Social Designer, fashion/clothing strain: Not hard at all   Food insecurity:    Worry: Never true    Inability: Never true   Transportation needs:    Medical: No    Non-medical: No  Tobacco Use   Smoking status: Never Smoker   Smokeless tobacco: Never Used  Substance and Sexual Activity   Alcohol use: No   Drug use: No   Sexual activity: Yes  Lifestyle   Physical activity:    Days per week: 0 days    Minutes per session: 0 min   Stress: To some extent  Relationships   Social connections:    Talks on phone: More than three times a week    Gets together: More than three times a week    Attends religious service: Never    Active member of club or organization: No    Attends meetings of clubs or organizations: Never    Relationship status: Married   Intimate partner violence:    Fear of current or ex partner: No    Emotionally abused: No    Physically abused: No    Forced sexual activity: No  Other Topics Concern   Not on  file  Social History Narrative   Lives in [place].      Left-handed   Caffeine use: drinks coffee/tea: 2 cups coffee in the morning, drinks 1 glass tea per day     Outpatient Encounter Medications as of 07/31/2018  Medication Sig   acetaminophen (TYLENOL) 500 MG tablet Take 500 mg by mouth every 6 (six) hours as needed (pain).    Ascorbic Acid (VITAMIN C PO) Take 1 tablet by mouth daily.   Calcium Carbonate-Vitamin D (CALCIUM-VITAMIN D) 500-200 MG-UNIT per tablet Take 1 tablet by mouth daily.   cephALEXin (KEFLEX) 500 MG capsule    cholecalciferol (VITAMIN D) 1000 UNITS tablet Take 2,000 Units by mouth daily.  ciprofloxacin (CIPRO) 500 MG tablet Take 1 tablet (500 mg total) by mouth 2 (two) times daily for 5 days.   citalopram (CELEXA) 20 MG tablet TAKE 1 TABLET(20 MG) BY MOUTH TWICE DAILY   cyclobenzaprine (FLEXERIL) 5 MG tablet Take 1 tablet (5 mg total) by mouth 3 (three) times daily as needed for muscle spasms.   dicyclomine (BENTYL) 10 MG capsule TAKE 1 CAPSULE BY MOUTH FOUR TIMES DAILY BEFORE MEALS AND AT BEDTIME   fluticasone (FLONASE) 50 MCG/ACT nasal spray SHAKE LIQUID AND USE 2 SPRAYS IN EACH NOSTRIL DAILY   folic acid (FOLVITE) 1 MG tablet TAKE 1 TABLET(1 MG) BY MOUTH DAILY (Patient taking differently: 2 mg. )   furosemide (LASIX) 20 MG tablet Take 1 tablet (20 mg total) by mouth daily.   glimepiride (AMARYL) 2 MG tablet TAKE 1 TABLET(2 MG) BY MOUTH DAILY BEFORE BREAKFAST   glucose blood test strip TEST SUGAR DAILY   levETIRAcetam (KEPPRA) 500 MG tablet Take 1 tablet (500 mg total) by mouth 2 (two) times daily.   levothyroxine (SYNTHROID, LEVOTHROID) 88 MCG tablet TAKE 1 TABLET BY MOUTH EVERY DAY BEFORE BREAKFAST   loperamide (IMODIUM) 2 MG capsule Take by mouth as needed for diarrhea or loose stools (Take as directed). Reported on 05/17/2015   metFORMIN (GLUCOPHAGE-XR) 500 MG 24 hr tablet Take 2 tablets (1,000 mg total) by mouth 2 (two) times daily after a  meal. (Patient taking differently: Take 1,000 mg by mouth 2 (two) times daily after a meal. Takes 1 (500) in the AM and 2 (1000)  in the PM)   methotrexate (RHEUMATREX) 2.5 MG tablet Take 4 tablets (10 mg total) by mouth once a week. Caution:Chemotherapy. Protect from light.   metoprolol tartrate (LOPRESSOR) 50 MG tablet TAKE 1 TABLET BY MOUTH TWICE DAILY   mirtazapine (REMERON) 15 MG tablet TAKE 1 TABLET(15 MG) BY MOUTH AT BEDTIME   Multiple Vitamins-Minerals (EYE VITAMINS PO) Take 2 tablets by mouth daily. AREDS formula   NYSTATIN powder APPLY TWICE DAILY UNDERNEATH BREASTS   omeprazole (PRILOSEC) 40 MG capsule TAKE 1 CAPSULE BY MOUTH EVERY DAY   potassium chloride SA (K-DUR,KLOR-CON) 20 MEQ tablet TAKE 2 TABLETS BY MOUTH TWICE DAILY   Probiotic Product (PROBIOTIC PO) Take 1 capsule by mouth daily.   simvastatin (ZOCOR) 40 MG tablet TAKE 1 TABLET BY MOUTH EVERY DAY   traMADol (ULTRAM) 50 MG tablet Take 1/2 tab by mouth four times daily as needed for severe pain   trimethoprim (TRIMPEX) 100 MG tablet Take 1 tablet (100 mg total) by mouth 2 (two) times daily.   XARELTO 20 MG TABS tablet TAKE 1 TABLET BY MOUTH EVERY DAY   [DISCONTINUED] phenazopyridine (PYRIDIUM) 200 MG tablet Take 1 tablet (200 mg total) by mouth 3 (three) times daily as needed for pain ((burning urination)). (Patient not taking: Reported on 07/03/2018)   No facility-administered encounter medications on file as of 07/31/2018.     Allergies  Allergen Reactions   Latex Rash   Caudal Tray [Lidocaine-Epinephrine]     Edema     Ciprofloxacin Hcl Nausea Only   Codeine Nausea Only   Diovan [Valsartan] Itching   Doxycycline Other (See Comments)    Might have caused heart to race per pt - not sure   Esomeprazole Magnesium     Headache     Levofloxacin     Insomnia     Myrbetriq [Mirabegron] Diarrhea   Neomycin-Bacitracin Zn-Polymyx     rash   Penicillins  hives   Sulfa Drugs Cross Reactors  Other (See Comments)    Pt not sure what reaction was   Verapamil     Edema    Vimovo [Naproxen-Esomeprazole]     Upset stomach     Review of Systems  Constitutional: Positive for activity change and fatigue. Negative for appetite change, chills, diaphoresis, fever and unexpected weight change.  Respiratory: Negative for cough and shortness of breath.   Cardiovascular: Negative for chest pain and palpitations.  Gastrointestinal: Positive for abdominal pain (lower abdominal pressure). Negative for diarrhea, nausea and vomiting.  Genitourinary: Positive for dysuria, frequency and urgency. Negative for decreased urine volume, difficulty urinating, dyspareunia, enuresis, flank pain and hematuria.  Neurological: Positive for weakness (generalized).  Psychiatric/Behavioral: Negative for confusion.  All other systems reviewed and are negative.        Observations/Objective: No vital signs or physical exam, this was a telephone or virtual health encounter.  Pt alert and oriented, answers all questions appropriately, and able to speak in full sentences.    Assessment and Plan: Diagnoses and all orders for this visit:  Dysuria Cystitis Reported symptoms consistent with recurrent UTI. Pt has follow up with urology scheduled. Will treat with below as last culture was susceptible to cirpo, cipro does cause nausea for the pt. Pt aware to take with food. Report any new or worsening symptoms. Follow up in office in 2 weeks.  -     ciprofloxacin (CIPRO) 500 MG tablet; Take 1 tablet (500 mg total) by mouth 2 (two) times daily for 5 days. -     Referral to Chronic Care Management Services  Grieving Essential hypertension Paroxysmal atrial fibrillation (HCC) Cerebrovascular accident (CVA) due to embolism of left middle cerebral artery (HCC) Type 2 diabetes mellitus without complication, without long-term current use of insulin ([place]) Husband is trying to manage his wife's care alone and is  having a difficult time. Will refer to CCM for assistance.  -     Referral to Chronic Care Management Services     Follow Up Instructions: Return in about 2 weeks (around 08/14/2018), or if symptoms worsen or fail to improve, for in office, blood work, urine, CXR.  Dr. Laurance Flatten wanted chest xray and blood work. Pt was not feeling up to coming in to office for appointment. Appointment made for pt to follow up in office in 2 weeks. Can recheck urine at that time.    I discussed the assessment and treatment plan with the patient. The patient was provided an opportunity to ask questions and all were answered. The patient agreed with the plan and demonstrated an understanding of the instructions.   The patient was advised to call back or seek an in-person evaluation if the symptoms worsen or if the condition fails to improve as anticipated.  The above assessment and management plan was discussed with the patient. The patient verbalized understanding of and has agreed to the management plan. Patient is aware to call the clinic if symptoms persist or worsen. Patient is aware when to return to the clinic for a follow-up visit. Patient educated on when it is appropriate to go to the emergency department.    I provided 25 minutes of non-face-to-face time during this encounter. The call started at [place]. The call ended at 1005. The other time was used for coordination of care.    Monia Pouch, FNP-C [facility] [address] [place], [place] [address] [phone]

## 2018-08-02 ENCOUNTER — Other Ambulatory Visit: Payer: Self-pay | Admitting: Family Medicine

## 2018-08-06 ENCOUNTER — Other Ambulatory Visit: Payer: Self-pay | Admitting: Family Medicine

## 2018-08-06 ENCOUNTER — Ambulatory Visit: Payer: Medicare Other | Admitting: Licensed Clinical Social Worker

## 2018-08-06 ENCOUNTER — Other Ambulatory Visit: Payer: Self-pay | Admitting: Adult Health

## 2018-08-06 DIAGNOSIS — E785 Hyperlipidemia, unspecified: Secondary | ICD-10-CM

## 2018-08-06 DIAGNOSIS — E119 Type 2 diabetes mellitus without complications: Secondary | ICD-10-CM

## 2018-08-06 DIAGNOSIS — F4321 Adjustment disorder with depressed mood: Secondary | ICD-10-CM

## 2018-08-06 DIAGNOSIS — R300 Dysuria: Secondary | ICD-10-CM

## 2018-08-06 DIAGNOSIS — I63412 Cerebral infarction due to embolism of left middle cerebral artery: Secondary | ICD-10-CM

## 2018-08-06 DIAGNOSIS — I10 Essential (primary) hypertension: Secondary | ICD-10-CM

## 2018-08-06 NOTE — Patient Instructions (Signed)
Licensed Clinical Social Worker Visit Information  Materials provided: No  Ms. Pigman/spouse were given information about Chronic Care Management services today including:  1. CCM service includes personalized support from designated clinical staff supervised by her physician, including individualized plan of care and coordination with other care providers 2. 24/7 contact phone numbers for assistance for urgent and routine care needs. 3. Service will only be billed when office clinical staff spend 20 minutes or more in a month to coordinate care. 4. Only one practitioner may furnish and bill the service in a calendar month. 5. The patient may stop CCM services at any time (effective at the end of the month) by phone call to the office staff. 6. The patient will be responsible for cost sharing (co-pay) of up to 20% of the service fee (after annual deductible is met).  Patient /spouse of patient did not agree to services and wish to consider information provided before deciding about enrollment in care management services.   Follow Up Plan:  LCSW to call Alegra J Martell /spouse of client in next 2 weeks to talk with Maila or her spouse further about CCM program services  The patient /spouse of patient verbalized understanding of instructions provided today and declined a print copy of patient instruction materials.    S. MSW, LCSW Licensed Clinical Social Worker [facility] Care Management [phone]  

## 2018-08-06 NOTE — Chronic Care Management (AMB) (Signed)
  Chronic Care Management    Clinical Social Work CCM Outreach Note  08/06/2018 Name: LEIGHA OLBERDING MRN: [id number] DOB: June 13, 1945  AUBRIONNA ISTRE is a 73 y.o. year old female who is a primary care patient of Laurance Flatten Estella Husk, MD . The CCM team was consulted for assistance with assessment of psychosocial needs.   LCSW reached out to Quintella Baton Baillie/spouse of client today by phone to introduce and offer CCM services.   Ms. Omar was given information about Chronic Care Management services today including:  1. CCM service includes personalized support from designated clinical staff supervised by her physician, including individualized plan of care and coordination with other care providers 2. 24/7 contact phone numbers for assistance for urgent and routine care needs. 3. Service will only be billed when office clinical staff spend 20 minutes or more in a month to coordinate care. 4. Only one practitioner may furnish and bill the service in a calendar month. 5. The patient may stop CCM services at any time (effective at the end of the month) by phone call to the office staff. 6. The patient will be responsible for cost sharing (co-pay) of up to 20% of the service fee (after annual deductible is met).  Patient did not agree to services and wishes to consider information provided before deciding about enrollment in care management services.   LCSW talked with [name] and her spouse today about CCM program services. LCSW talked with [name] and her spouse about CCM nursing support and CCM LCSW support . Kana and her spouse expressed interest in CCM program services. Kalifa and her spouse stated that Saria had experienced the death of her brother in recent weeks and that she was grieving the recent death of her brother.LCSW expressed sympathy to Jimma and her spouse over the death of Aerabella's brother. LCSW talked with [name] about care she had received in the past with [facility].  LCSW offered to call Carrington and her  spouse in next 2 weeks to talk further with Jenni and her spouse about CCM program services. Gyselle and her spouse agreed to this plan.   Follow Up Plan: LCSW to call [name] and her spouse in next 2 weeks to talk with Iza and her spouse further about CCM program services  Norva Riffle.Evany Schecter MSW, LCSW Licensed Clinical Social Worker [facility] Care [facility] [phone]

## 2018-08-08 ENCOUNTER — Other Ambulatory Visit: Payer: Self-pay | Admitting: Adult Health

## 2018-08-12 ENCOUNTER — Other Ambulatory Visit: Payer: Self-pay

## 2018-08-12 MED ORDER — LEVETIRACETAM 500 MG PO TABS: 500.0000 mg | ORAL_TABLET | Freq: Two times a day (BID) | ORAL | 0 refills | Status: DC

## 2018-08-14 ENCOUNTER — Other Ambulatory Visit: Payer: Self-pay

## 2018-08-14 ENCOUNTER — Ambulatory Visit: Payer: Medicare Other | Admitting: *Deleted

## 2018-08-14 DIAGNOSIS — E119 Type 2 diabetes mellitus without complications: Secondary | ICD-10-CM

## 2018-08-14 DIAGNOSIS — I10 Essential (primary) hypertension: Secondary | ICD-10-CM

## 2018-08-15 NOTE — Patient Instructions (Signed)
Visit Information  Goals Addressed            This Visit's Progress   . RNCM Goal: Better control of diabetes with an A1C goal of <7       Current Barriers:  . Cognitive Deficits . Chronic Disease Management support and education needs related to diabetes  Nurse Case Manager Clinical Goal(s):  Marland Kitchen Over the next 30 days, patient will verbalize understanding of plan for better blood sugar management . Over the next 30 days, patient will work with Consulting civil engineer to address needs related to diabetes management.  Interventions:  . RNCM reviewed LCSW telephone note, most recent PCP office note, and recent lab results as well as future ordered labs  Patient Self Care Activities:  . Has assistance from husband with ADLs and IADLs due to CVA  Initial goal documentation     . RNCM Goal: Patient will come in for labwork and CXR ordered by PCP at visit 06/2018        Current Barriers:  . Cognitive Deficits . Chronic Disease Management support and education needs related to self management of chronic medical conditions  Nurse Case Manager Clinical Goal(s):  Marland Kitchen Over the next 30 days, patient will work with Consulting civil engineer to address needs related to lab and xray orders  Interventions:  . Chart review and review of most recent PCP office note . Verified that lab results ordered at last visit have note been collected . Verified that CXR ordered at last visit has not been collected  Patient Self Care Activities:  . Husband assists with activities due to previous CVA  Initial goal documentation     . RNCM Goal: Patient will follow-up with necessary specialists over the next 3 months       Current Barriers:  . Cognitive Deficits . Chronic Disease Management support and education needs related to Macular Degeneration, diabetes, & Afib.   Nurse Case Manager Clinical Goal(s):  Marland Kitchen Over the next 30 days, patient will work with Consulting civil engineer to address needs related to specialty appointment  scheduling and recommended follow-ups.  . Over the next 60 days, patient will attend all scheduled medical appointments: to be scheduled  Interventions:  . Reviewed recommendations for f/u from visit with PCP in 06/2018 . Reviewed appointments and patient has not scheduled recommend follow-ups  Patient Self Care Activities:  . Husband provides assistance due to CVA  Initial goal documentation        Follow up with CCM Team over the next 7 days.  Chong Sicilian, RN-BC, BSN Nurse Care Manager [facility] [phone]

## 2018-08-15 NOTE — Chronic Care Management (AMB) (Signed)
Chronic Care Management   RN Care Manager Note   08/14/2018 Name: [name] MRN: [id number] DOB: 1945-11-09  Referred by: Chipper Herb, MD Reason for referral : Chronic Care Management (RN Chart Review)   [name] is a 73 y.o. year old female who is a primary care patient of Laurance Flatten Estella Husk, MD. The CCM team was consulted for assistance with chronic disease management and care coordination needs.    [name] was referred for CCM services to help her manage her chronic medical conditions that include: diabetes, hx of CVA, Afib, and acute grief process r/t the death of her brother. I reviewed [name] chart and paid particular attention to her most recent PCP office note and her most recent lab results. I also reviewed her telephone note with Theadore Nan, LCSW. [name] has assistance with ADLs from her husband and he typically attends appointments with her and speaks for her on the telephone call.   Dr Laurance Flatten noted the following during her most recent visit in 06/2018: She is due to get blood work and we will also get a chest x-ray at the same time and schedule a visit with the clinical pharmacist for polypharmacy issues. She is also due for an eye exam to follow up on macular degeneration and a cardiology follow-up. Chart review shows that she has not had the labwork or the chest xray. A visit with the clinical pharmacist and cardiologist still need to be arranged as well. I am uncertain if she has seen the ophthalmologist at this time.     GOALS    . RNCM Goal: Better control of diabetes with an A1C goal of <7       Current Barriers:  . Cognitive Deficits . Chronic Disease Management support and education needs related to diabetes  Nurse Case Manager Clinical Goal(s):  Marland Kitchen Over the next 30 days, patient will verbalize understanding of plan for better blood sugar management . Over the next 30 days, patient will work with Consulting civil engineer to address needs related to diabetes  management.  Interventions:  . RNCM reviewed LCSW telephone note, most recent PCP office note, and recent lab results as well as future ordered labs  Patient Self Care Activities:  . Has assistance from husband with ADLs and IADLs due to CVA  Initial goal documentation     . RNCM Goal: Patient will come in for labwork and CXR ordered by PCP at visit 06/2018        Current Barriers:  . Cognitive Deficits . Chronic Disease Management support and education needs related to self management of chronic medical conditions  Nurse Case Manager Clinical Goal(s):  Marland Kitchen Over the next 30 days, patient will work with Consulting civil engineer to address needs related to lab and xray orders  Interventions:  . Chart review and review of most recent PCP office note . Verified that lab results ordered at last visit have note been collected . Verified that CXR ordered at last visit has not been collected  Patient Self Care Activities:  . Husband assists with activities due to previous CVA  Initial goal documentation     . RNCM Goal: Patient will follow-up with necessary specialists over the next 3 months       Current Barriers:  . Cognitive Deficits . Chronic Disease Management support and education needs related to Macular Degeneration, diabetes, & Afib.   Nurse Case Manager Clinical Goal(s):  Marland Kitchen Over the next 30 days,  patient will work with Consulting civil engineer to address needs related to specialty appointment scheduling and recommended follow-ups.  . Over the next 60 days, patient will attend all scheduled medical appointments: to be scheduled  Interventions:  . Reviewed recommendations for f/u from visit with PCP in 06/2018 . Reviewed appointments and patient has not scheduled recommend follow-ups  Patient Self Care Activities:  . Husband provides assistance due to CVA  Initial goal documentation           Follow Up Plan The care management team will reach out to the patient again over the next 7  days.   Chong Sicilian, RN-BC, BSN Nurse Care Manager [facility] [phone]

## 2018-08-16 ENCOUNTER — Ambulatory Visit: Payer: Medicare Other | Admitting: Licensed Clinical Social Worker

## 2018-08-16 ENCOUNTER — Other Ambulatory Visit: Payer: Self-pay

## 2018-08-16 DIAGNOSIS — I63412 Cerebral infarction due to embolism of left middle cerebral artery: Secondary | ICD-10-CM

## 2018-08-16 DIAGNOSIS — F4321 Adjustment disorder with depressed mood: Secondary | ICD-10-CM

## 2018-08-16 DIAGNOSIS — E119 Type 2 diabetes mellitus without complications: Secondary | ICD-10-CM

## 2018-08-16 DIAGNOSIS — E785 Hyperlipidemia, unspecified: Secondary | ICD-10-CM

## 2018-08-16 DIAGNOSIS — I10 Essential (primary) hypertension: Secondary | ICD-10-CM

## 2018-08-16 NOTE — Patient Instructions (Addendum)
Licensed Clinical Water engineer Provided: No   LCSW reached out to [place] of client today by phone to introduce and offer CCM services.   Ms. Goodnough were given information about Chronic Care Management services today including:  1. CCM service includes personalized support from designated clinical staff supervised by her physician, including individualized plan of care and coordination with other care providers 2. 24/7 contact phone numbers for assistance for urgent and routine care needs. 3. Service will only be billed when office clinical staff spend 20 minutes or more in a month to coordinate care. 4. Only one practitioner may furnish and bill the service in a calendar month. 5. The patient may stop CCM services at any time (effective at the end of the month) by phone call to the office staff. 6. The patient will be responsible for cost sharing (co-pay) of up to 20% of the service fee (after annual deductible is met).  Patient/spouse of patient did not agree to services and wish to consider information provided before deciding about enrollment in care management services.   LCSW shared information with client/spouse about CCM program services support with RN and with LCSW   Follow Up Plan: LCSW to call client/spouse of client in next 2 weeks to talk further with client or spouse of client about CCM program services  The patient /spouse of patient verbalized understanding of instructions provided today and declined a print copy of patient instruction materials.   Norva Riffle.Aldora Perman MSW, LCSW Licensed Clinical Social Worker [facility] Care Management [phone]

## 2018-08-16 NOTE — Chronic Care Management (AMB) (Signed)
  Chronic Care Management    Clinical Social Work CCM Outreach Note  08/16/2018 Name: [name] MRN: [id number] DOB: 06/11/1945  [name] is a 73 y.o. year old female who is a primary care [name] of Pamela Flatten Estella Husk, MD . The CCM team was consulted for assistance with assessment of psychosocial needs.Marland Kitchen   LCSW reached out to [place] of client today by phone to introduce and offer CCM services.   [name] were given information about Chronic Care Management services today including:  1. CCM service includes personalized support from designated clinical staff supervised by her physician, including individualized plan of care and coordination with other care providers 2. 24/7 contact phone numbers for assistance for urgent and routine care needs. 3. Service will only be billed when office clinical staff spend 20 minutes or more in a month to coordinate care. 4. Only one practitioner may furnish and bill the service in a calendar month. 5. The [name] may stop CCM services at any time (effective at the end of the month) by phone call to the office staff. 6. The [name] will be responsible for cost sharing (co-pay) of up to 20% of the service fee (after annual deductible is met).  [name]/spouse of [name] did not agree to services and wish to consider information provided before deciding about enrollment in care management services.   LCSW shared information with Pamela about CCM program services support with RN and with LCSW  Follow Up Plan: LCSW to call Pamela of client in next 2 weeks to talk further with Pamela about CCM program services  [name].[name] MSW, LCSW Licensed Clinical Social Worker [facility] Care [facility] [phone]

## 2018-08-19 ENCOUNTER — Telehealth: Payer: Self-pay | Admitting: Family Medicine

## 2018-08-19 ENCOUNTER — Ambulatory Visit (INDEPENDENT_AMBULATORY_CARE_PROVIDER_SITE_OTHER): Payer: Medicare Other | Admitting: Family Medicine

## 2018-08-19 ENCOUNTER — Encounter: Payer: Self-pay | Admitting: Family Medicine

## 2018-08-19 ENCOUNTER — Other Ambulatory Visit: Payer: Self-pay

## 2018-08-19 ENCOUNTER — Ambulatory Visit (INDEPENDENT_AMBULATORY_CARE_PROVIDER_SITE_OTHER): Payer: Medicare Other

## 2018-08-19 VITALS — BP 152/75 | HR 71 | Temp 97.1°F | Ht 64.0 in | Wt 175.0 lb

## 2018-08-19 DIAGNOSIS — R079 Chest pain, unspecified: Secondary | ICD-10-CM | POA: Diagnosis not present

## 2018-08-19 DIAGNOSIS — E785 Hyperlipidemia, unspecified: Secondary | ICD-10-CM | POA: Diagnosis not present

## 2018-08-19 DIAGNOSIS — I6349 Cerebral infarction due to embolism of other cerebral artery: Secondary | ICD-10-CM | POA: Diagnosis not present

## 2018-08-19 DIAGNOSIS — F3342 Major depressive disorder, recurrent, in full remission: Secondary | ICD-10-CM

## 2018-08-19 DIAGNOSIS — E034 Atrophy of thyroid (acquired): Secondary | ICD-10-CM

## 2018-08-19 DIAGNOSIS — L409 Psoriasis, unspecified: Secondary | ICD-10-CM

## 2018-08-19 DIAGNOSIS — E1159 Type 2 diabetes mellitus with other circulatory complications: Secondary | ICD-10-CM

## 2018-08-19 DIAGNOSIS — I6930 Unspecified sequelae of cerebral infarction: Secondary | ICD-10-CM | POA: Diagnosis not present

## 2018-08-19 DIAGNOSIS — N390 Urinary tract infection, site not specified: Secondary | ICD-10-CM

## 2018-08-19 DIAGNOSIS — I480 Paroxysmal atrial fibrillation: Secondary | ICD-10-CM

## 2018-08-19 DIAGNOSIS — I10 Essential (primary) hypertension: Secondary | ICD-10-CM

## 2018-08-19 DIAGNOSIS — R6889 Other general symptoms and signs: Secondary | ICD-10-CM | POA: Diagnosis not present

## 2018-08-19 LAB — BAYER DCA HB A1C WAIVED: HB A1C (BAYER DCA - WAIVED): 7.8 % — ABNORMAL HIGH (ref <7.0)

## 2018-08-19 MED ORDER — FUROSEMIDE 20 MG PO TABS: 20.0000 mg | ORAL_TABLET | Freq: Every day | ORAL | 3 refills | Status: DC

## 2018-08-19 MED ORDER — METOPROLOL TARTRATE 50 MG PO TABS: 50.0000 mg | ORAL_TABLET | Freq: Two times a day (BID) | ORAL | 3 refills | Status: DC

## 2018-08-19 MED ORDER — DICYCLOMINE HCL 10 MG PO CAPS: ORAL_CAPSULE | ORAL | 3 refills | Status: DC

## 2018-08-19 MED ORDER — SIMVASTATIN 40 MG PO TABS: 40.0000 mg | ORAL_TABLET | Freq: Every day | ORAL | 1 refills | Status: DC

## 2018-08-19 MED ORDER — CITALOPRAM HYDROBROMIDE 20 MG PO TABS: 20.0000 mg | ORAL_TABLET | Freq: Two times a day (BID) | ORAL | 3 refills | Status: DC

## 2018-08-19 MED ORDER — METFORMIN HCL ER 500 MG PO TB24: 1000.0000 mg | ORAL_TABLET | Freq: Two times a day (BID) | ORAL | 1 refills | Status: DC

## 2018-08-19 MED ORDER — OMEPRAZOLE 40 MG PO CPDR: DELAYED_RELEASE_CAPSULE | ORAL | 1 refills | Status: DC

## 2018-08-19 MED ORDER — GLIMEPIRIDE 2 MG PO TABS: 2.0000 mg | ORAL_TABLET | Freq: Every day | ORAL | 3 refills | Status: DC

## 2018-08-19 NOTE — Telephone Encounter (Signed)
Aware.  Daughter can wait in waiting room in case she is needed for  Help getting a urine specimen.

## 2018-08-19 NOTE — Progress Notes (Signed)
BP (!) 152/75   Pulse 71   Temp (!) 97.1 F (36.2 C) (Oral)   Ht '5\' 4"'$  (1.626 m)   Wt 175 lb (79.4 kg)   BMI 30.04 kg/m    Subjective:   Patient ID: [name], female    DOB: 07/22/1945, 73 y.o.   MRN: [id number]  HPI: [name] is a 73 y.o. female presenting on 08/19/2018 for Establish Care ([place] patient - Patient gets frequent UTI's - would like it checked ) and Medical Management of Chronic Issues   HPI Patient has recurrent UTIs and wanted to leave urine today, she was unable to leave urine, she sees urology for this and she is on a chronic antibiotic for it.  We will follow-up with urology  Type 2 diabetes mellitus Patient comes in today for recheck of his diabetes. Patient has been currently taking glimepiride and metformin. Patient is not currently on an ACE inhibitor/ARB. Patient has not seen an ophthalmologist this year. Patient denies any issues with their feet.   Hypothyroidism recheck Patient is coming in for thyroid recheck today as well. They deny any issues with hair changes or heat or cold problems or diarrhea or constipation. They deny any chest pain or palpitations. They are currently on levothyroxine 39mcrograms   Hypertension Patient is currently on metoprolol, and their blood pressure today is 152/75. Patient denies any lightheadedness or dizziness. Patient denies headaches, blurred vision, chest pains, shortness of breath, or weakness. Denies any side effects from medication and is content with current medication.   Patient has A. fib and is on Xarelto for this.  She has a cardiologist Dr. TLovena Lewho is managing this for the most part.  Patient has seizure disorder and is on Keppra and thinks they may be having some partial seizures or absence seizures per husband, they will follow-up ASAP with their neurologist.  Patient has major depression especially since her stroke has been dealing with mood things.  She is currently on citalopram and Remeron and  her husband feels like for the most part those are working well.  Patient denies any suicidal ideations or thoughts of hurting herself.  Per husband patient has right-sided weakness in both the arm and the leg but more on the arm than the leg and also has expressive aphasia, he feels like she understands everything but just cannot get out the words sometimes.  This is all been since the stroke and has not worsened.  Her strength did improve early on but not in the past few years since the stroke.  They would like to do home health care to help because he is starting to have trouble getting her in and out of the shower in the bath and either accommodate safety living or help her with the strength or help him with tasks throughout the day that he has difficulty managing with her health.  Patient had a recent fall within the past week and a half where she fell on the right side of her ribs and she still sore in that area.  She had some bruising initially but he says that that has gone and the swelling is gone but she still has pain to touch there and they are wondering if they can get an x-ray today.  Relevant past medical, surgical, family and social history reviewed and updated as indicated. Interim medical history since our last visit reviewed. Allergies and medications reviewed and updated.  Review of Systems  Constitutional: Negative for  chills and fever.  Eyes: Negative for visual disturbance.  Respiratory: Negative for chest tightness and shortness of breath.   Cardiovascular: Negative for chest pain and leg swelling.  Gastrointestinal: Positive for abdominal pain (Patient has left upper abdominal pain from a hernia of the has been known to be there, still about this is not tender, they have been referred to a surgeon already.).  Genitourinary: Negative for difficulty urinating and dysuria.  Musculoskeletal: Positive for arthralgias and myalgias. Negative for back pain and gait problem.  Skin:  Negative for rash.  Neurological: Positive for weakness. Negative for dizziness, light-headedness, numbness and headaches.  Psychiatric/Behavioral: Negative for agitation and behavioral problems.  All other systems reviewed and are negative.   Per HPI unless specifically indicated above   Allergies as of 08/19/2018      Reactions   Latex Rash   Caudal Tray [lidocaine-epinephrine]    Edema    Ciprofloxacin Hcl Nausea Only   Codeine Nausea Only   Diovan [valsartan] Itching   Doxycycline Other (See Comments)   Might have caused heart to race per pt - not sure   Esomeprazole Magnesium    Headache    Levofloxacin    Insomnia    Myrbetriq [mirabegron] Diarrhea   Neomycin-bacitracin Zn-polymyx    rash   Penicillins    hives   Sulfa Drugs Cross Reactors Other (See Comments)   Pt not sure what reaction was   Verapamil    Edema   Vimovo [naproxen-esomeprazole]    Upset stomach      Medication List       Accurate as of August 19, 2018 12:28 PM. If you have any questions, ask your nurse or doctor.        STOP taking these medications   trimethoprim 100 MG tablet Commonly known as: TRIMPEX Stopped by: Fransisca Kaufmann Wynonia Medero, MD     TAKE these medications   acetaminophen 500 MG tablet Commonly known as: TYLENOL Take 500 mg by mouth every 6 (six) hours as needed (pain).   calcium-vitamin D 500-200 MG-UNIT tablet Take 1 tablet by mouth daily.   cephALEXin 500 MG capsule Commonly known as: KEFLEX   cholecalciferol 1000 units tablet Commonly known as: VITAMIN D Take 2,000 Units by mouth daily.   citalopram 20 MG tablet Commonly known as: CELEXA Take 1 tablet (20 mg total) by mouth 2 (two) times daily. What changed: See the new instructions. Changed by: Fransisca Kaufmann Devin Foskey, MD   cyclobenzaprine 5 MG tablet Commonly known as: FLEXERIL Take 1 tablet (5 mg total) by mouth 3 (three) times daily as needed for muscle spasms.   dicyclomine 10 MG capsule Commonly known as:  BENTYL TAKE 1 CAPSULE BY MOUTH FOUR TIMES DAILY BEFORE MEALS AND AT BEDTIME   EYE VITAMINS PO Take 2 tablets by mouth daily. AREDS formula   fluticasone 50 MCG/ACT nasal spray Commonly known as: FLONASE SHAKE LIQUID AND USE 2 SPRAYS IN EACH NOSTRIL DAILY   folic acid 1 MG tablet Commonly known as: FOLVITE TAKE 1 TABLET(1 MG) BY MOUTH DAILY What changed: See the new instructions.   furosemide 20 MG tablet Commonly known as: LASIX Take 1 tablet (20 mg total) by mouth daily.   glimepiride 2 MG tablet Commonly known as: AMARYL Take 1 tablet (2 mg total) by mouth daily with breakfast. What changed: See the new instructions. Changed by: Fransisca Kaufmann Anasophia Pecor, MD   glucose blood test strip TEST SUGAR DAILY   levETIRAcetam 500 MG tablet Commonly known  as: KEPPRA Take 1 tablet (500 mg total) by mouth 2 (two) times daily.   levothyroxine 88 MCG tablet Commonly known as: SYNTHROID TAKE 1 TABLET BY MOUTH EVERY DAY BEFORE BREAKFAST   loperamide 2 MG capsule Commonly known as: IMODIUM Take by mouth as needed for diarrhea or loose stools (Take as directed). Reported on 05/17/2015   metFORMIN 500 MG 24 hr tablet Commonly known as: GLUCOPHAGE-XR Take 2 tablets (1,000 mg total) by mouth 2 (two) times daily after a meal. What changed: additional instructions   methotrexate 2.5 MG tablet Commonly known as: RHEUMATREX Take 4 tablets (10 mg total) by mouth once a week. Caution:Chemotherapy. Protect from light.   metoprolol tartrate 50 MG tablet Commonly known as: LOPRESSOR Take 1 tablet (50 mg total) by mouth 2 (two) times daily.   mirtazapine 15 MG tablet Commonly known as: REMERON TAKE 1 TABLET(15 MG) BY MOUTH AT BEDTIME   nystatin powder Generic drug: nystatin APPLY TWICE DAILY UNDERNEATH BREASTS   omeprazole 40 MG capsule Commonly known as: PRILOSEC TAKE 1 CAPSULE BY MOUTH EVERY DAY What changed:   how much to take  how to take this  when to take this  additional  instructions Changed by: Fransisca Kaufmann Coolidge Gossard, MD   potassium chloride SA 20 MEQ tablet Commonly known as: K-DUR TAKE 2 TABLETS BY MOUTH TWICE DAILY   PROBIOTIC PO Take 1 capsule by mouth daily.   simvastatin 40 MG tablet Commonly known as: ZOCOR Take 1 tablet (40 mg total) by mouth daily.   traMADol 50 MG tablet Commonly known as: ULTRAM Take 1/2 tab by mouth four times daily as needed for severe pain   VITAMIN C PO Take 1 tablet by mouth daily.   Xarelto 20 MG Tabs tablet Generic drug: rivaroxaban TAKE 1 TABLET BY MOUTH EVERY DAY        Objective:   BP (!) 152/75   Pulse 71   Temp (!) 97.1 F (36.2 C) (Oral)   Ht '5\' 4"'$  (1.626 m)   Wt 175 lb (79.4 kg)   BMI 30.04 kg/m   Wt Readings from Last 3 Encounters:  08/19/18 175 lb (79.4 kg)  02/27/18 169 lb (76.7 kg)  10/17/17 177 lb (80.3 kg)    Physical Exam Vitals signs and nursing note reviewed.  Constitutional:      General: She is not in acute distress.    Appearance: She is well-developed. She is not diaphoretic.  Eyes:     Conjunctiva/sclera: Conjunctivae normal.  Cardiovascular:     Rate and Rhythm: Normal rate and regular rhythm.     Heart sounds: Normal heart sounds. No murmur.  Pulmonary:     Effort: Pulmonary effort is normal. No respiratory distress.     Breath sounds: Normal breath sounds. No wheezing, rhonchi or rales.  Chest:     Chest wall: Tenderness (Right lower rib tenderness, pain with inspiration, no flail chest) present.  Abdominal:     Tenderness: There is abdominal tenderness (Left upper quadrant abdominal tenderness, large ventral hernia at least 6 cm wide, able to reduce). There is no right CVA tenderness, left CVA tenderness, guarding or rebound.  Musculoskeletal: Normal range of motion.        General: No tenderness.  Skin:    General: Skin is warm and dry.     Findings: No rash.  Neurological:     Mental Status: She is alert and oriented to person, place, and time.     Motor:  Weakness (Right-sided  weakness especially in arm, 1 out of 5 in right arm, 3 out of 5 in right leg) present.     Coordination: Coordination normal.     Gait: Gait abnormal.     Comments: Patient has difficulty formulating words but seems to understand everything.  Psychiatric:        Behavior: Behavior normal.       Assessment & Plan:   Problem List Items Addressed This Visit      Cardiovascular and Mediastinum   HTN (hypertension)   Relevant Medications   furosemide (LASIX) 20 MG tablet   metoprolol tartrate (LOPRESSOR) 50 MG tablet   simvastatin (ZOCOR) 40 MG tablet   PAF (paroxysmal atrial fibrillation) (HCC)   Relevant Medications   furosemide (LASIX) 20 MG tablet   metoprolol tartrate (LOPRESSOR) 50 MG tablet   simvastatin (ZOCOR) 40 MG tablet   Other Relevant Orders   CBC with Differential/Platelet (Completed)   Lipid panel (Completed)     Endocrine   Hypothyroidism   Relevant Medications   metoprolol tartrate (LOPRESSOR) 50 MG tablet   Other Relevant Orders   TSH (Completed)   Type 2 diabetes mellitus (HCC)   Relevant Medications   glimepiride (AMARYL) 2 MG tablet   metFORMIN (GLUCOPHAGE-XR) 500 MG 24 hr tablet   simvastatin (ZOCOR) 40 MG tablet   Other Relevant Orders   Bayer DCA Hb A1c Waived (Completed)   CMP14+EGFR (Completed)   Lipid panel (Completed)     Other   Late effects of cerebral ischemic stroke   Relevant Orders   Ambulatory referral to [place]   Depression   Relevant Medications   citalopram (CELEXA) 20 MG tablet    Other Visit Diagnoses    Recurrent urinary tract infection    -  Primary   Psoriasis       Relevant Orders   Folate (Completed)   Vitamin B12 (Completed)   Chest pain, unspecified type       Relevant Orders   DG Ribs Unilateral W/Chest Right (Completed)      Rib x-ray and chest x-ray, no acute bony abnormalities noted, await final read from radiology  No changes in medication currently as we are just getting to  know the patient as she is new to me but will check blood work and will monitor and see if we need to change anything from there.  Recommend to use Lasix only as needed  Placed home health referral Follow up plan: Return in about 3 months (around 11/19/2018), or if symptoms worsen or fail to improve, for diabetes and thyroid.  Counseling provided for all of the vaccine components Orders Placed This Encounter  Procedures  . DG Ribs Unilateral W/Chest Right  . Urinalysis, Complete  . Bayer DCA Hb A1c Waived  . CBC with Differential/Platelet  . CMP14+EGFR  . Lipid panel  . TSH  . Folate  . Vitamin B12    Caryl Pina, MD [facility] 08/19/2018, 12:28 PM

## 2018-08-20 DIAGNOSIS — R0781 Pleurodynia: Secondary | ICD-10-CM | POA: Diagnosis not present

## 2018-08-20 DIAGNOSIS — S299XXA Unspecified injury of thorax, initial encounter: Secondary | ICD-10-CM | POA: Diagnosis not present

## 2018-08-20 LAB — CMP14+EGFR
ALT: 20 IU/L (ref 0–32)
AST: 33 IU/L (ref 0–40)
Albumin/Globulin Ratio: 1.7 (ref 1.2–2.2)
Albumin: 4.3 g/dL (ref 3.8–4.8)
Alkaline Phosphatase: 70 IU/L (ref 39–117)
BUN/Creatinine Ratio: 13 (ref 12–28)
BUN: 8 mg/dL (ref 8–27)
Bilirubin Total: 0.7 mg/dL (ref 0.0–1.2)
CO2: 25 mmol/L (ref 20–29)
Calcium: 10 mg/dL (ref 8.7–10.3)
Chloride: 100 mmol/L (ref 96–106)
Creatinine, Ser: 0.62 mg/dL (ref 0.57–1.00)
GFR calc Af Amer: 106 mL/min/{1.73_m2} (ref >59)
GFR calc non Af Amer: 92 mL/min/{1.73_m2} (ref >59)
Globulin, Total: 2.6 g/dL (ref 1.5–4.5)
Glucose: 193 mg/dL — ABNORMAL HIGH (ref 65–99)
Potassium: 4.9 mmol/L (ref 3.5–5.2)
Sodium: 143 mmol/L (ref 134–144)
Total Protein: 6.9 g/dL (ref 6.0–8.5)

## 2018-08-20 LAB — CBC WITH DIFFERENTIAL/PLATELET
Basophils Absolute: 0.1 10*3/uL (ref 0.0–0.2)
Basos: 1 %
EOS (ABSOLUTE): 0.4 10*3/uL (ref 0.0–0.4)
Eos: 6 %
Hematocrit: 39.9 % (ref 34.0–46.6)
Hemoglobin: 12.8 g/dL (ref 11.1–15.9)
Immature Grans (Abs): 0 10*3/uL (ref 0.0–0.1)
Immature Granulocytes: 0 %
Lymphocytes Absolute: 2.6 10*3/uL (ref 0.7–3.1)
Lymphs: 36 %
MCH: 29.5 pg (ref 26.6–33.0)
MCHC: 32.1 g/dL (ref 31.5–35.7)
MCV: 92 fL (ref 79–97)
Monocytes Absolute: 0.7 10*3/uL (ref 0.1–0.9)
Monocytes: 10 %
Neutrophils Absolute: 3.4 10*3/uL (ref 1.4–7.0)
Neutrophils: 47 %
Platelets: 223 10*3/uL (ref 150–450)
RBC: 4.34 x10E6/uL (ref 3.77–5.28)
RDW: 14.3 % (ref 11.7–15.4)
WBC: 7.2 10*3/uL (ref 3.4–10.8)

## 2018-08-20 LAB — LIPID PANEL
Chol/HDL Ratio: 3 ratio (ref 0.0–4.4)
Cholesterol, Total: 104 mg/dL (ref 100–199)
HDL: 35 mg/dL — ABNORMAL LOW (ref >39)
LDL Calculated: 48 mg/dL (ref 0–99)
Triglycerides: 104 mg/dL (ref 0–149)
VLDL Cholesterol Cal: 21 mg/dL (ref 5–40)

## 2018-08-20 LAB — FOLATE: Folate: >20 ng/mL (ref >3.0)

## 2018-08-20 LAB — VITAMIN B12: Vitamin B-12: 654 pg/mL (ref 232–1245)

## 2018-08-20 LAB — TSH: TSH: 2.47 u[IU]/mL (ref 0.450–4.500)

## 2018-08-21 ENCOUNTER — Telehealth: Payer: Self-pay | Admitting: Family Medicine

## 2018-08-21 NOTE — Telephone Encounter (Signed)
I placed home health referral already, I finished note now so they can go ahead and run the referral.  Please let Jan know

## 2018-08-27 DIAGNOSIS — G40909 Epilepsy, unspecified, not intractable, without status epilepticus: Secondary | ICD-10-CM | POA: Diagnosis not present

## 2018-08-27 DIAGNOSIS — E039 Hypothyroidism, unspecified: Secondary | ICD-10-CM | POA: Diagnosis not present

## 2018-08-27 DIAGNOSIS — I69353 Hemiplegia and hemiparesis following cerebral infarction affecting right non-dominant side: Secondary | ICD-10-CM | POA: Diagnosis not present

## 2018-08-27 DIAGNOSIS — I10 Essential (primary) hypertension: Secondary | ICD-10-CM | POA: Diagnosis not present

## 2018-08-27 DIAGNOSIS — E119 Type 2 diabetes mellitus without complications: Secondary | ICD-10-CM | POA: Diagnosis not present

## 2018-08-27 DIAGNOSIS — Z7901 Long term (current) use of anticoagulants: Secondary | ICD-10-CM | POA: Diagnosis not present

## 2018-08-27 DIAGNOSIS — Z9181 History of falling: Secondary | ICD-10-CM | POA: Diagnosis not present

## 2018-08-27 DIAGNOSIS — I482 Chronic atrial fibrillation, unspecified: Secondary | ICD-10-CM | POA: Diagnosis not present

## 2018-08-27 DIAGNOSIS — I69320 Aphasia following cerebral infarction: Secondary | ICD-10-CM | POA: Diagnosis not present

## 2018-08-27 DIAGNOSIS — G459 Transient cerebral ischemic attack, unspecified: Secondary | ICD-10-CM | POA: Diagnosis not present

## 2018-08-27 DIAGNOSIS — F329 Major depressive disorder, single episode, unspecified: Secondary | ICD-10-CM | POA: Diagnosis not present

## 2018-08-28 ENCOUNTER — Encounter: Payer: Medicare Other | Admitting: *Deleted

## 2018-08-28 DIAGNOSIS — I69353 Hemiplegia and hemiparesis following cerebral infarction affecting right non-dominant side: Secondary | ICD-10-CM | POA: Diagnosis not present

## 2018-08-28 DIAGNOSIS — I69320 Aphasia following cerebral infarction: Secondary | ICD-10-CM | POA: Diagnosis not present

## 2018-08-28 DIAGNOSIS — I10 Essential (primary) hypertension: Secondary | ICD-10-CM | POA: Diagnosis not present

## 2018-08-28 DIAGNOSIS — I482 Chronic atrial fibrillation, unspecified: Secondary | ICD-10-CM | POA: Diagnosis not present

## 2018-08-28 DIAGNOSIS — E039 Hypothyroidism, unspecified: Secondary | ICD-10-CM | POA: Diagnosis not present

## 2018-08-28 DIAGNOSIS — E119 Type 2 diabetes mellitus without complications: Secondary | ICD-10-CM | POA: Diagnosis not present

## 2018-08-29 ENCOUNTER — Telehealth: Payer: Self-pay

## 2018-08-29 NOTE — Telephone Encounter (Signed)
Left message for patient to remind of missed remote transmission.  

## 2018-08-30 ENCOUNTER — Ambulatory Visit: Payer: Medicare Other | Admitting: Licensed Clinical Social Worker

## 2018-08-30 DIAGNOSIS — I10 Essential (primary) hypertension: Secondary | ICD-10-CM

## 2018-08-30 DIAGNOSIS — I69320 Aphasia following cerebral infarction: Secondary | ICD-10-CM | POA: Diagnosis not present

## 2018-08-30 DIAGNOSIS — E039 Hypothyroidism, unspecified: Secondary | ICD-10-CM | POA: Diagnosis not present

## 2018-08-30 DIAGNOSIS — F4321 Adjustment disorder with depressed mood: Secondary | ICD-10-CM

## 2018-08-30 DIAGNOSIS — E1159 Type 2 diabetes mellitus with other circulatory complications: Secondary | ICD-10-CM

## 2018-08-30 DIAGNOSIS — E785 Hyperlipidemia, unspecified: Secondary | ICD-10-CM

## 2018-08-30 DIAGNOSIS — R300 Dysuria: Secondary | ICD-10-CM

## 2018-08-30 DIAGNOSIS — I482 Chronic atrial fibrillation, unspecified: Secondary | ICD-10-CM | POA: Diagnosis not present

## 2018-08-30 DIAGNOSIS — I69353 Hemiplegia and hemiparesis following cerebral infarction affecting right non-dominant side: Secondary | ICD-10-CM | POA: Diagnosis not present

## 2018-08-30 DIAGNOSIS — E119 Type 2 diabetes mellitus without complications: Secondary | ICD-10-CM | POA: Diagnosis not present

## 2018-08-30 DIAGNOSIS — I63412 Cerebral infarction due to embolism of left middle cerebral artery: Secondary | ICD-10-CM

## 2018-08-30 NOTE — Patient Instructions (Signed)
Licensed Clinical Social Worker Visit Information  Ms. Micheline Chapman Clemencia Course, were given information about Chronic Care Management services today including:  1. CCM service includes personalized support from designated clinical staff supervised by her physician, including individualized plan of care and coordination with other care providers 2. 24/7 contact phone numbers for assistance for urgent and routine care needs. 3. Service will only be billed when office clinical staff spend 20 minutes or more in a month to coordinate care. 4. Only one practitioner may furnish and bill the service in a calendar month. 5. The patient may stop CCM services at any time (effective at the end of the month) by phone call to the office staff. 6. The patient will be responsible for cost sharing (co-pay) of up to 20% of the service fee (after annual deductible is met).  Patient/spouse [name] did not agree to services and wish to consider information provided before deciding about enrollment in care management services.    Materials Provided: No  Follow Up Plan: LCSW to call [name] or spouse,[name],in next 4 weeks to talk with [name] about CCM program services  The patient [name], [name], verbalized understanding of instructions provided today and declined a print copy of patient instruction materials.   Norva Riffle.Zoee Heeney MSW, LCSW Licensed Clinical Social Worker [facility] Care Management [phone]

## 2018-08-30 NOTE — Chronic Care Management (AMB) (Signed)
  Care Management Note   [name] is a 73 y.o. year old female who is a primary care patient of Dettinger, Fransisca Kaufmann, MD. The CM team was consulted for assistance with chronic disease management and care coordination.   I reached out to [place] of client by phone today.   Ms. Bernat  Raechel Chute of client were  given information about Chronic Care Management services today including:  1. CCM service includes personalized support from designated clinical staff supervised by her physician, including individualized plan of care and coordination with other care providers 2. 24/7 contact phone numbers for assistance for urgent and routine care needs. 3. Service will only be billed when office clinical staff spend 20 minutes or more in a month to coordinate care. 4. Only one practitioner may furnish and bill the service in a calendar month. 5. The patient may stop CCM services at any time (effective at the end of the month) by phone call to the office staff. 6. The patient will be responsible for cost sharing (co-pay) of up to 20% of the service fee (after annual deductible is met). Patient/spouse of patient did not agree to services and wish to consider information provided before deciding about enrollment in care management services.     LCSW spoke with Alyson Locket, spouse of client , via phone today about CCM program. He said client is currently scheduled to receive home health physical therapy, home health occupational therapy and home health speech therapy.  LCSW talked with [name] about CCM nursing support and about CCM social work support. He agreed for LCSW to call back in 4 weeks to talk further with him and [place] about CCM program support  Follow Up Plan: LCSW to call [name] or spouse, Philisha Weinel, in 4 weeks to speak further with [name] about CCM program support  Norva Riffle.Rickia [place] MSW, LCSW Licensed Clinical Social Worker [facility] [phone]

## 2018-09-02 ENCOUNTER — Telehealth: Payer: Self-pay

## 2018-09-02 DIAGNOSIS — I69320 Aphasia following cerebral infarction: Secondary | ICD-10-CM | POA: Diagnosis not present

## 2018-09-02 DIAGNOSIS — I10 Essential (primary) hypertension: Secondary | ICD-10-CM | POA: Diagnosis not present

## 2018-09-02 DIAGNOSIS — E039 Hypothyroidism, unspecified: Secondary | ICD-10-CM | POA: Diagnosis not present

## 2018-09-02 DIAGNOSIS — E119 Type 2 diabetes mellitus without complications: Secondary | ICD-10-CM | POA: Diagnosis not present

## 2018-09-02 DIAGNOSIS — I69353 Hemiplegia and hemiparesis following cerebral infarction affecting right non-dominant side: Secondary | ICD-10-CM | POA: Diagnosis not present

## 2018-09-02 DIAGNOSIS — I482 Chronic atrial fibrillation, unspecified: Secondary | ICD-10-CM | POA: Diagnosis not present

## 2018-09-02 NOTE — Telephone Encounter (Signed)
Delle Reining from [employer] support called and stated that they could not get a read on the pt new monitor. She did not know why the pt new monitor was not working. She states from the last transmission the pt have on record is that she was near ERI and maybe she reached it and that is why they can not get a read from the monitor. She said maybe we should bring the pt in and use the programmer to see if we could get a read on the pt ppm. I told her I will let the nurse know and someone will give the pt a call back.

## 2018-09-02 NOTE — Telephone Encounter (Signed)
Spoke to [employer], confirmed that last transmission on 05/28/18 shows longevity of 4.1-6.7 years. Per rep, communicate w/ device via programmer. They are unsure why the device will not communicate w/ new home monitor.   Spoke to pt husband, DC appt made for 09/05/18.

## 2018-09-04 DIAGNOSIS — E039 Hypothyroidism, unspecified: Secondary | ICD-10-CM | POA: Diagnosis not present

## 2018-09-04 DIAGNOSIS — I482 Chronic atrial fibrillation, unspecified: Secondary | ICD-10-CM | POA: Diagnosis not present

## 2018-09-04 DIAGNOSIS — I69320 Aphasia following cerebral infarction: Secondary | ICD-10-CM | POA: Diagnosis not present

## 2018-09-04 DIAGNOSIS — E119 Type 2 diabetes mellitus without complications: Secondary | ICD-10-CM | POA: Diagnosis not present

## 2018-09-04 DIAGNOSIS — I69353 Hemiplegia and hemiparesis following cerebral infarction affecting right non-dominant side: Secondary | ICD-10-CM | POA: Diagnosis not present

## 2018-09-04 DIAGNOSIS — I10 Essential (primary) hypertension: Secondary | ICD-10-CM | POA: Diagnosis not present

## 2018-09-05 ENCOUNTER — Telehealth: Payer: Self-pay | Admitting: Family Medicine

## 2018-09-05 ENCOUNTER — Ambulatory Visit (INDEPENDENT_AMBULATORY_CARE_PROVIDER_SITE_OTHER): Payer: Medicare Other | Admitting: *Deleted

## 2018-09-05 ENCOUNTER — Telehealth: Payer: Self-pay

## 2018-09-05 ENCOUNTER — Telehealth: Payer: Self-pay | Admitting: Adult Health

## 2018-09-05 ENCOUNTER — Other Ambulatory Visit: Payer: Self-pay

## 2018-09-05 DIAGNOSIS — I495 Sick sinus syndrome: Secondary | ICD-10-CM | POA: Diagnosis not present

## 2018-09-05 LAB — CUP PACEART INCLINIC DEVICE CHECK
Brady Statistic RA Percent Paced: 1 % — CL
Brady Statistic RV Percent Paced: 1 % — CL
Date Time Interrogation Session: 20200716174544
Implantable Lead Implant Date: 20100630
Implantable Lead Implant Date: 20100630
Implantable Lead Location: 753859
Implantable Lead Location: 753860
Implantable Pulse Generator Implant Date: 20100630
Lead Channel Pacing Threshold Amplitude: 0.5 V
Lead Channel Pacing Threshold Amplitude: 0.75 V
Lead Channel Pacing Threshold Pulse Width: 0.4 ms
Lead Channel Pacing Threshold Pulse Width: 0.4 ms
Lead Channel Sensing Intrinsic Amplitude: 5 mV
Lead Channel Sensing Intrinsic Amplitude: 5.5 mV
Pulse Gen Model: 2110
Pulse Gen Serial Number: 2285953

## 2018-09-05 NOTE — Progress Notes (Signed)
Pacemaker check in clinic. Normal device function. Thresholds, sensing, impedances consistent with previous measurements. Device programmed to maximize longevity. Mode switch < 1 % or  no high ventricular rates noted. Device programmed at appropriate safety margins. Histogram distribution appropriate for patient activity level. Device programmed to optimize intrinsic conduction. Estimated longevity  3.7 yrs. Patient remote transmissions not received since 4/20.Marland Kitchen Contacted industry and they will contact pt to trouble shoot issue with monitor. Pt has received 2 new monitors and has been unable to send a transmission to clinic. Will f/u up with clinic 09/10/18 if no transmission assistance received. Patient education completed.

## 2018-09-05 NOTE — Telephone Encounter (Signed)
I called pt's husband and offered a sooner appt with Dr. Jaynee Eagles (none avail on a Thurs with Janett Billow) on Thursday 09/19/2018 @ 2:00 pm arrival 1:30 pm. Pt's husband verbalized appreciation. He understands they need to bring masks and understands to r/s appt if exposed to covid19 within 14 days before the appt if they develop any symptoms or fever within the 3+ days before the appt. He verbalized appreciation for the call.

## 2018-09-05 NOTE — Telephone Encounter (Signed)
Pts husband called in and stated they needed to r/s to a Thursday due to transportation, the earliest that was avail was 08/20, pts husband stated they needed Dr Jaynee Eagles but if Janett Billow can see them sooner on a Thursday they would take it

## 2018-09-05 NOTE — Telephone Encounter (Signed)
    COVID-19 Pre-Screening Questions:  . In the past 7 to 10 days have you had a cough,  shortness of breath, headache, congestion, fever (100 or greater) body aches, chills, sore throat, or sudden loss of taste or sense of smell? No . Have you been around anyone with known Covid 19. No . Have you been around anyone who is awaiting Covid 19 test results in the past 7 to 10 days? No . Have you been around anyone who has been exposed to Covid 19, or has mentioned symptoms of Covid 19 within the past 7 to 10 days? No  If you have any concerns/questions about symptoms patients report during screening (either on the phone or at threshold). Contact the provider seeing the patient or DOD for further guidance.  If neither are available contact a member of the leadership team.         Pt husband answered No to all Covid-19 prescreening questions. I let him know that we are reducing the number of people coming into the office and if the pt can physically come alone to please do so. The pt husband states the pt has difficulty walking and will need help. I told the pt husband only 1 person can come with her and that person will have to wear a mask. I also told him the patient will have to wear a mask as well. The pt husband verbalized understanding.

## 2018-09-05 NOTE — Telephone Encounter (Signed)
Xray report reviewed with husband.

## 2018-09-05 NOTE — Patient Instructions (Signed)
Representative from [employer] will contact you at home to trouble shoot device transmission. If you do not receive a call by 09/10/18 please contact the device clinic.

## 2018-09-06 DIAGNOSIS — I69320 Aphasia following cerebral infarction: Secondary | ICD-10-CM | POA: Diagnosis not present

## 2018-09-06 DIAGNOSIS — I10 Essential (primary) hypertension: Secondary | ICD-10-CM | POA: Diagnosis not present

## 2018-09-06 DIAGNOSIS — I69353 Hemiplegia and hemiparesis following cerebral infarction affecting right non-dominant side: Secondary | ICD-10-CM | POA: Diagnosis not present

## 2018-09-06 DIAGNOSIS — E039 Hypothyroidism, unspecified: Secondary | ICD-10-CM | POA: Diagnosis not present

## 2018-09-06 DIAGNOSIS — E119 Type 2 diabetes mellitus without complications: Secondary | ICD-10-CM | POA: Diagnosis not present

## 2018-09-06 DIAGNOSIS — I482 Chronic atrial fibrillation, unspecified: Secondary | ICD-10-CM | POA: Diagnosis not present

## 2018-09-09 ENCOUNTER — Encounter: Payer: Self-pay | Admitting: Cardiology

## 2018-09-10 ENCOUNTER — Ambulatory Visit: Payer: Self-pay | Admitting: Adult Health

## 2018-09-10 ENCOUNTER — Telehealth: Payer: Self-pay | Admitting: Family Medicine

## 2018-09-11 ENCOUNTER — Ambulatory Visit (INDEPENDENT_AMBULATORY_CARE_PROVIDER_SITE_OTHER): Payer: Medicare Other | Admitting: Family Medicine

## 2018-09-11 ENCOUNTER — Ambulatory Visit (INDEPENDENT_AMBULATORY_CARE_PROVIDER_SITE_OTHER): Payer: Medicare Other | Admitting: *Deleted

## 2018-09-11 DIAGNOSIS — I495 Sick sinus syndrome: Secondary | ICD-10-CM

## 2018-09-11 DIAGNOSIS — N3090 Cystitis, unspecified without hematuria: Secondary | ICD-10-CM | POA: Diagnosis not present

## 2018-09-11 DIAGNOSIS — I480 Paroxysmal atrial fibrillation: Secondary | ICD-10-CM

## 2018-09-11 MED ORDER — CIPROFLOXACIN HCL 250 MG PO TABS: 250.0000 mg | ORAL_TABLET | Freq: Two times a day (BID) | ORAL | 0 refills | Status: DC

## 2018-09-11 NOTE — Progress Notes (Signed)
Telephone visit  Subjective: CC: ?UTI PCP: Dettinger, Fransisca Kaufmann, MD [name] is a 73 y.o. female calls for telephone consult today. Patient provides verbal consent for consult held via phone.  Location of patient: home Location of provider: [facility] Others present for call: husband  1. UTI Her husband reports a several day history of her complaining of dysuria.  No hematuria.  She is incontinent at baseline and will relies on depends.  He thinks that because she was lying in a saturated depends this is likely why she developed a UTI.  She is had recurrent issues with this with last UTI in April, treated with Cipro.  She is also followed by Dr. Jeffie Pollock and has an appointment at the end of the month.  She is on chronic daily Keflex for UTI prevention.  Denies any nausea, vomiting, fevers or new back pain.   ROS: Per HPI  Allergies  Allergen Reactions  . Latex Rash  . Caudal Tray [Lidocaine-Epinephrine]     Edema    . Ciprofloxacin Hcl Nausea Only  . Codeine Nausea Only  . Diovan [Valsartan] Itching  . Doxycycline Other (See Comments)    Might have caused heart to race per pt - not sure  . Esomeprazole Magnesium     Headache    . Levofloxacin     Insomnia    . Myrbetriq [Mirabegron] Diarrhea  . Neomycin-Bacitracin Zn-Polymyx     rash  . Penicillins     hives  . Sulfa Drugs Cross Reactors Other (See Comments)    Pt not sure what reaction was  . Verapamil     Edema   . Vimovo [Naproxen-Esomeprazole]     Upset stomach    Past Medical History:  Diagnosis Date  . Anemia   . Bronchial spasms   . Chronic anxiety   . Chronic kidney disease   . Complication of anesthesia    " I shake real bad "  . Diverticulosis   . Elevated blood sugar   . Esophageal stricture   . Family history of anesthesia complication    Daughter also shakes while waking up  . GERD (gastroesophageal reflux disease)   . H/O hiatal hernia   . H/O scarlet fever   . History of bladder repair  surgery   . Hx of vaginal hysterectomy   . Hypertension   . Hypothyroidism   . IBS (irritable bowel syndrome)    vs diarrhea vs abd. fullness   . Impaired glucose tolerance   . Neuromuscular disorder ([place])    periferal neuropathy  . Pacemaker    St. Jude  . Paroxysmal atrial fibrillation (HCC)    a. failed flecainide, tikosyn, amio;  b. 01/2012 s/p RFCA.  . Psoriasis   . Psoriasis   . Rectocele, female   . Seizures ([place])   . Stroke ([place])   . Tachy-brady syndrome ([place])    a. 08/19/2008 s/p PPM: SJM 2110 Accent  . Uterine prolapse   . Vaginal prolapse 1998    Current Outpatient Medications:  .  acetaminophen (TYLENOL) 500 MG tablet, Take 500 mg by mouth every 6 (six) hours as needed (pain). , Disp: , Rfl:  .  Ascorbic Acid (VITAMIN C PO), Take 1 tablet by mouth daily., Disp: , Rfl:  .  Calcium Carbonate-Vitamin D (CALCIUM-VITAMIN D) 500-200 MG-UNIT per tablet, Take 1 tablet by mouth daily., Disp: , Rfl:  .  cephALEXin (KEFLEX) 500 MG capsule, , Disp: , Rfl:  .  cholecalciferol (VITAMIN  D) 1000 UNITS tablet, Take 2,000 Units by mouth daily. , Disp: , Rfl:  .  citalopram (CELEXA) 20 MG tablet, Take 1 tablet (20 mg total) by mouth 2 (two) times daily., Disp: 180 tablet, Rfl: 3 .  cyclobenzaprine (FLEXERIL) 5 MG tablet, Take 1 tablet (5 mg total) by mouth 3 (three) times daily as needed for muscle spasms., Disp: 30 tablet, Rfl: 0 .  dicyclomine (BENTYL) 10 MG capsule, TAKE 1 CAPSULE BY MOUTH FOUR TIMES DAILY BEFORE MEALS AND AT BEDTIME, Disp: 360 capsule, Rfl: 3 .  fluticasone (FLONASE) 50 MCG/ACT nasal spray, SHAKE LIQUID AND USE 2 SPRAYS IN EACH NOSTRIL DAILY, Disp: 48 g, Rfl: 1 .  folic acid (FOLVITE) 1 MG tablet, TAKE 1 TABLET(1 MG) BY MOUTH DAILY (Patient taking differently: 2 mg. ), Disp: 90 tablet, Rfl: 0 .  furosemide (LASIX) 20 MG tablet, Take 1 tablet (20 mg total) by mouth daily., Disp: 90 tablet, Rfl: 3 .  glimepiride (AMARYL) 2 MG tablet, Take 1 tablet (2 mg total) by mouth  daily with breakfast., Disp: 90 tablet, Rfl: 3 .  glucose blood test strip, TEST SUGAR DAILY, Disp: 100 each, Rfl: 3 .  levETIRAcetam (KEPPRA) 500 MG tablet, Take 1 tablet (500 mg total) by mouth 2 (two) times daily., Disp: 180 tablet, Rfl: 0 .  levothyroxine (SYNTHROID, LEVOTHROID) 88 MCG tablet, TAKE 1 TABLET BY MOUTH EVERY DAY BEFORE BREAKFAST, Disp: 90 tablet, Rfl: 2 .  loperamide (IMODIUM) 2 MG capsule, Take by mouth as needed for diarrhea or loose stools (Take as directed). Reported on 05/17/2015, Disp: , Rfl:  .  metFORMIN (GLUCOPHAGE-XR) 500 MG 24 hr tablet, Take 2 tablets (1,000 mg total) by mouth 2 (two) times daily after a meal., Disp: 180 tablet, Rfl: 1 .  methotrexate (RHEUMATREX) 2.5 MG tablet, Take 4 tablets (10 mg total) by mouth once a week. Caution:Chemotherapy. Protect from light., Disp: 16 tablet, Rfl: 1 .  metoprolol tartrate (LOPRESSOR) 50 MG tablet, Take 1 tablet (50 mg total) by mouth 2 (two) times daily., Disp: 180 tablet, Rfl: 3 .  mirtazapine (REMERON) 15 MG tablet, TAKE 1 TABLET(15 MG) BY MOUTH AT BEDTIME, Disp: 90 tablet, Rfl: 0 .  Multiple Vitamins-Minerals (EYE VITAMINS PO), Take 2 tablets by mouth daily. AREDS formula, Disp: , Rfl:  .  NYSTATIN powder, APPLY TWICE DAILY UNDERNEATH BREASTS, Disp: 60 g, Rfl: 0 .  omeprazole (PRILOSEC) 40 MG capsule, TAKE 1 CAPSULE BY MOUTH EVERY DAY, Disp: 90 capsule, Rfl: 1 .  potassium chloride SA (K-DUR,KLOR-CON) 20 MEQ tablet, TAKE 2 TABLETS BY MOUTH TWICE DAILY, Disp: 360 tablet, Rfl: 1 .  Probiotic Product (PROBIOTIC PO), Take 1 capsule by mouth daily., Disp: , Rfl:  .  simvastatin (ZOCOR) 40 MG tablet, Take 1 tablet (40 mg total) by mouth daily., Disp: 90 tablet, Rfl: 1 .  traMADol (ULTRAM) 50 MG tablet, Take 1/2 tab by mouth four times daily as needed for severe pain, Disp: , Rfl:  .  XARELTO 20 MG TABS tablet, TAKE 1 TABLET BY MOUTH EVERY DAY, Disp: 90 tablet, Rfl: 0  Assessment/ Plan: 73 y.o. female   1. Recurrent  cystitis Patient sounds like she has recurrent urinary tract infection.  She is on chronic Keflex.  I reviewed her last several urine cultures and on multiple of these cultures she had cephalosporin resistance.  She is grown different species each time.  The species that seem to be consistently sensitive to Cipro.  She tolerated this antibiotic in April without  difficulty.  I have sent in Cipro 250 mg twice daily for the next 3 days.  We discussed that if symptoms persist or worsen she is to seek immediate medical attention.  Keep follow-up appointment with Dr. Jeffie Pollock at the end of the month.   Start time: 12:30pm End time: 12:37pm  Total time spent on patient care (including telephone call/ virtual visit): 15 minutes  [place], [place] [phone]

## 2018-09-12 DIAGNOSIS — I482 Chronic atrial fibrillation, unspecified: Secondary | ICD-10-CM | POA: Diagnosis not present

## 2018-09-12 DIAGNOSIS — I10 Essential (primary) hypertension: Secondary | ICD-10-CM | POA: Diagnosis not present

## 2018-09-12 DIAGNOSIS — E039 Hypothyroidism, unspecified: Secondary | ICD-10-CM | POA: Diagnosis not present

## 2018-09-12 DIAGNOSIS — I69320 Aphasia following cerebral infarction: Secondary | ICD-10-CM | POA: Diagnosis not present

## 2018-09-12 DIAGNOSIS — E119 Type 2 diabetes mellitus without complications: Secondary | ICD-10-CM | POA: Diagnosis not present

## 2018-09-12 DIAGNOSIS — I69353 Hemiplegia and hemiparesis following cerebral infarction affecting right non-dominant side: Secondary | ICD-10-CM | POA: Diagnosis not present

## 2018-09-12 LAB — CUP PACEART REMOTE DEVICE CHECK
Battery Remaining Longevity: 57 mo
Battery Remaining Percentage: 46 %
Battery Voltage: 2.86 V
Brady Statistic AP VP Percent: 1 %
Brady Statistic AP VS Percent: 1 %
Brady Statistic AS VP Percent: 1 %
Brady Statistic AS VS Percent: 99 %
Brady Statistic RA Percent Paced: 1 %
Brady Statistic RV Percent Paced: 1 %
Date Time Interrogation Session: 20200722152128
Implantable Lead Implant Date: 20100630
Implantable Lead Implant Date: 20100630
Implantable Lead Location: 753859
Implantable Lead Location: 753860
Implantable Pulse Generator Implant Date: 20100630
Lead Channel Impedance Value: 350 Ohm
Lead Channel Impedance Value: 440 Ohm
Lead Channel Pacing Threshold Amplitude: 0.75 V
Lead Channel Pacing Threshold Amplitude: 1.25 V
Lead Channel Pacing Threshold Pulse Width: 0.4 ms
Lead Channel Pacing Threshold Pulse Width: 0.8 ms
Lead Channel Sensing Intrinsic Amplitude: 4.6 mV
Lead Channel Sensing Intrinsic Amplitude: 5.5 mV
Lead Channel Setting Pacing Amplitude: 2 V
Lead Channel Setting Pacing Amplitude: 2.5 V
Lead Channel Setting Pacing Pulse Width: 0.8 ms
Lead Channel Setting Sensing Sensitivity: 1.5 mV
Pulse Gen Model: 2110
Pulse Gen Serial Number: 2285953

## 2018-09-18 ENCOUNTER — Telehealth: Payer: Self-pay | Admitting: Neurology

## 2018-09-18 DIAGNOSIS — E119 Type 2 diabetes mellitus without complications: Secondary | ICD-10-CM | POA: Diagnosis not present

## 2018-09-18 DIAGNOSIS — E039 Hypothyroidism, unspecified: Secondary | ICD-10-CM | POA: Diagnosis not present

## 2018-09-18 DIAGNOSIS — I482 Chronic atrial fibrillation, unspecified: Secondary | ICD-10-CM | POA: Diagnosis not present

## 2018-09-18 DIAGNOSIS — I69353 Hemiplegia and hemiparesis following cerebral infarction affecting right non-dominant side: Secondary | ICD-10-CM | POA: Diagnosis not present

## 2018-09-18 DIAGNOSIS — I10 Essential (primary) hypertension: Secondary | ICD-10-CM | POA: Diagnosis not present

## 2018-09-18 DIAGNOSIS — I69320 Aphasia following cerebral infarction: Secondary | ICD-10-CM | POA: Diagnosis not present

## 2018-09-18 NOTE — Telephone Encounter (Signed)
Patient has been very stable over the last several years.  Given what is going on with COVID-19 and patient's COVID score of 6, if patient continues to be stable I would postpone this appointment.  I would however be happy to see them if they have any new concerns.  I left a message regarding this on her home cell phone number.  Arlyss Gandy, which you call first thing in the morning and see if you can reach them about the appointment.  If Leiyah stable they do not have to come in and risk being exposed.  And they can also follow-up in neurology as needed. If they want to reschedule, then reschedule with Janett Billow thanks

## 2018-09-19 ENCOUNTER — Telehealth: Payer: Self-pay | Admitting: Internal Medicine

## 2018-09-19 ENCOUNTER — Ambulatory Visit: Payer: Self-pay | Admitting: Neurology

## 2018-09-19 NOTE — Telephone Encounter (Signed)
Called and spoke with with pts husband(Robert) stated the pt has been stable and no concerns. Robert agreed on the plan to call if the pt needed anything- also wanted to thank Dr. Jaynee Eagles for thinking of the pts safety. FYI

## 2018-09-19 NOTE — Telephone Encounter (Signed)
New Message   Patient has questions about remote transmission and the device they have.  Please call patient back.

## 2018-09-19 NOTE — Telephone Encounter (Signed)
I let the pt husband know that we did receive her transmission. He thanked me for the call.

## 2018-09-20 DIAGNOSIS — I69320 Aphasia following cerebral infarction: Secondary | ICD-10-CM | POA: Diagnosis not present

## 2018-09-20 DIAGNOSIS — I10 Essential (primary) hypertension: Secondary | ICD-10-CM | POA: Diagnosis not present

## 2018-09-20 DIAGNOSIS — I69353 Hemiplegia and hemiparesis following cerebral infarction affecting right non-dominant side: Secondary | ICD-10-CM | POA: Diagnosis not present

## 2018-09-20 DIAGNOSIS — E039 Hypothyroidism, unspecified: Secondary | ICD-10-CM | POA: Diagnosis not present

## 2018-09-20 DIAGNOSIS — I482 Chronic atrial fibrillation, unspecified: Secondary | ICD-10-CM | POA: Diagnosis not present

## 2018-09-20 DIAGNOSIS — E119 Type 2 diabetes mellitus without complications: Secondary | ICD-10-CM | POA: Diagnosis not present

## 2018-09-25 NOTE — Progress Notes (Signed)
Remote pacemaker transmission.   

## 2018-09-26 ENCOUNTER — Ambulatory Visit: Payer: Medicare Other | Admitting: Licensed Clinical Social Worker

## 2018-09-26 DIAGNOSIS — I69320 Aphasia following cerebral infarction: Secondary | ICD-10-CM | POA: Diagnosis not present

## 2018-09-26 DIAGNOSIS — G40909 Epilepsy, unspecified, not intractable, without status epilepticus: Secondary | ICD-10-CM | POA: Diagnosis not present

## 2018-09-26 DIAGNOSIS — Z9181 History of falling: Secondary | ICD-10-CM | POA: Diagnosis not present

## 2018-09-26 DIAGNOSIS — I482 Chronic atrial fibrillation, unspecified: Secondary | ICD-10-CM | POA: Diagnosis not present

## 2018-09-26 DIAGNOSIS — I63412 Cerebral infarction due to embolism of left middle cerebral artery: Secondary | ICD-10-CM

## 2018-09-26 DIAGNOSIS — E1159 Type 2 diabetes mellitus with other circulatory complications: Secondary | ICD-10-CM

## 2018-09-26 DIAGNOSIS — I69353 Hemiplegia and hemiparesis following cerebral infarction affecting right non-dominant side: Secondary | ICD-10-CM | POA: Diagnosis not present

## 2018-09-26 DIAGNOSIS — E039 Hypothyroidism, unspecified: Secondary | ICD-10-CM | POA: Diagnosis not present

## 2018-09-26 DIAGNOSIS — I10 Essential (primary) hypertension: Secondary | ICD-10-CM | POA: Diagnosis not present

## 2018-09-26 DIAGNOSIS — E119 Type 2 diabetes mellitus without complications: Secondary | ICD-10-CM | POA: Diagnosis not present

## 2018-09-26 DIAGNOSIS — F4321 Adjustment disorder with depressed mood: Secondary | ICD-10-CM

## 2018-09-26 DIAGNOSIS — F329 Major depressive disorder, single episode, unspecified: Secondary | ICD-10-CM | POA: Diagnosis not present

## 2018-09-26 DIAGNOSIS — G459 Transient cerebral ischemic attack, unspecified: Secondary | ICD-10-CM | POA: Diagnosis not present

## 2018-09-26 DIAGNOSIS — Z7901 Long term (current) use of anticoagulants: Secondary | ICD-10-CM | POA: Diagnosis not present

## 2018-09-26 DIAGNOSIS — E785 Hyperlipidemia, unspecified: Secondary | ICD-10-CM

## 2018-09-26 DIAGNOSIS — F3342 Major depressive disorder, recurrent, in full remission: Secondary | ICD-10-CM

## 2018-09-26 DIAGNOSIS — E034 Atrophy of thyroid (acquired): Secondary | ICD-10-CM

## 2018-09-26 NOTE — Patient Instructions (Addendum)
Licensed Clinical Social Worker Visit Information  Materials Provided: No  I reached out to [place] of client by phone today.  Ms. Freeberg Raechel Chute of client were given information about Chronic Care Management services today including:  1. CCM service includes personalized support from designated clinical staff supervised by her physician, including individualized plan of care and coordination with other care providers 2. 24/7 contact phone numbers for assistance for urgent and routine care needs. 3. Service will only be billed when office clinical staff spend 20 minutes or more in a month to coordinate care. 4. Only one practitioner may furnish and bill the service in a calendar month. 5. The patient may stop CCM services at any time (effective at the end of the month) by phone call to the office staff. 6. The patient will be responsible for cost sharing (co-pay) of up to 20% of the service fee (after annual deductible is met). Patient/spouse of patient did not agree to enrollment in care management services and do not wish to consider at this time.   LCSW spoke via phone with Alyson Locket, spouse of client about CCM program services. Alyson Locket declined CCM program services at this time.However, LCSW spoke with [name] about services of CCM program for Aileen as a resource for future support. [name] said he would keep information in mind related to CCM support for possible future help for client.  The patient/spouse of patient  verbalized understanding of instructions provided today and declined a print copy of patient instruction materials.   Norva Riffle.Juanette Urizar MSW, LCSW Licensed Clinical Social Worker [facility] [phone]

## 2018-09-26 NOTE — Chronic Care Management (AMB) (Signed)
  Care Management Note   [name] is a 73 y.o. year old female who is a primary care patient of Dettinger, Fransisca Kaufmann, MD. The CM team was consulted for assistance with chronic disease management and care coordination.   I reached out to [place] of client by phone today.   Ms. [place] Raechel Chute of client were given information about Chronic Care Management services today including:  1. CCM service includes personalized support from designated clinical staff supervised by her physician, including individualized plan of care and coordination with other care providers 2. 24/7 contact phone numbers for assistance for urgent and routine care needs. 3. Service will only be billed when office clinical staff spend 20 minutes or more in a month to coordinate care. 4. Only one practitioner may furnish and bill the service in a calendar month. 5. The patient may stop CCM services at any time (effective at the end of the month) by phone call to the office staff. 6. The patient will be responsible for cost sharing (co-pay) of up to 20% of the service fee (after annual deductible is met). Patient/spouse of patient did not agree to enrollment in care management services and do  not wish to consider at this time.   Review of patient status, including review of consultants reports, relevant laboratory and other test results, and collaboration with appropriate care team members and the patient's provider was performed as part of comprehensive patient evaluation and provision of chronic care management services.   LCSW spoke via phone with Alyson Locket, spouse of client about CCM program services. Alyson Locket declined CCM program services at this time.However, LCSW spoke with [name] about services of CCM program for [place] as a resource for future support. [name] said he would keep information in mind related to CCM support for possible future help for client.  Norva Riffle.Dsean Vantol MSW, LCSW Licensed  Clinical Social Worker [facility] [phone]

## 2018-09-27 DIAGNOSIS — E119 Type 2 diabetes mellitus without complications: Secondary | ICD-10-CM | POA: Diagnosis not present

## 2018-09-27 DIAGNOSIS — I69353 Hemiplegia and hemiparesis following cerebral infarction affecting right non-dominant side: Secondary | ICD-10-CM | POA: Diagnosis not present

## 2018-09-27 DIAGNOSIS — I482 Chronic atrial fibrillation, unspecified: Secondary | ICD-10-CM | POA: Diagnosis not present

## 2018-09-27 DIAGNOSIS — E039 Hypothyroidism, unspecified: Secondary | ICD-10-CM | POA: Diagnosis not present

## 2018-09-27 DIAGNOSIS — I69320 Aphasia following cerebral infarction: Secondary | ICD-10-CM | POA: Diagnosis not present

## 2018-09-27 DIAGNOSIS — I10 Essential (primary) hypertension: Secondary | ICD-10-CM | POA: Diagnosis not present

## 2018-10-02 ENCOUNTER — Other Ambulatory Visit: Payer: Self-pay | Admitting: Family Medicine

## 2018-10-02 DIAGNOSIS — E119 Type 2 diabetes mellitus without complications: Secondary | ICD-10-CM | POA: Diagnosis not present

## 2018-10-02 DIAGNOSIS — I69353 Hemiplegia and hemiparesis following cerebral infarction affecting right non-dominant side: Secondary | ICD-10-CM | POA: Diagnosis not present

## 2018-10-02 DIAGNOSIS — I10 Essential (primary) hypertension: Secondary | ICD-10-CM | POA: Diagnosis not present

## 2018-10-02 DIAGNOSIS — I69320 Aphasia following cerebral infarction: Secondary | ICD-10-CM | POA: Diagnosis not present

## 2018-10-02 DIAGNOSIS — I482 Chronic atrial fibrillation, unspecified: Secondary | ICD-10-CM | POA: Diagnosis not present

## 2018-10-02 DIAGNOSIS — E039 Hypothyroidism, unspecified: Secondary | ICD-10-CM | POA: Diagnosis not present

## 2018-10-03 ENCOUNTER — Ambulatory Visit (INDEPENDENT_AMBULATORY_CARE_PROVIDER_SITE_OTHER): Payer: Medicare Other

## 2018-10-03 ENCOUNTER — Other Ambulatory Visit: Payer: Self-pay

## 2018-10-03 DIAGNOSIS — E039 Hypothyroidism, unspecified: Secondary | ICD-10-CM

## 2018-10-03 DIAGNOSIS — Z7901 Long term (current) use of anticoagulants: Secondary | ICD-10-CM

## 2018-10-03 DIAGNOSIS — Z9181 History of falling: Secondary | ICD-10-CM

## 2018-10-03 DIAGNOSIS — I10 Essential (primary) hypertension: Secondary | ICD-10-CM

## 2018-10-03 DIAGNOSIS — E119 Type 2 diabetes mellitus without complications: Secondary | ICD-10-CM | POA: Diagnosis not present

## 2018-10-03 DIAGNOSIS — G40909 Epilepsy, unspecified, not intractable, without status epilepticus: Secondary | ICD-10-CM | POA: Diagnosis not present

## 2018-10-03 DIAGNOSIS — I69320 Aphasia following cerebral infarction: Secondary | ICD-10-CM | POA: Diagnosis not present

## 2018-10-03 DIAGNOSIS — I482 Chronic atrial fibrillation, unspecified: Secondary | ICD-10-CM

## 2018-10-03 DIAGNOSIS — F329 Major depressive disorder, single episode, unspecified: Secondary | ICD-10-CM | POA: Diagnosis not present

## 2018-10-03 DIAGNOSIS — G459 Transient cerebral ischemic attack, unspecified: Secondary | ICD-10-CM | POA: Diagnosis not present

## 2018-10-03 DIAGNOSIS — I69353 Hemiplegia and hemiparesis following cerebral infarction affecting right non-dominant side: Secondary | ICD-10-CM

## 2018-10-04 DIAGNOSIS — I69353 Hemiplegia and hemiparesis following cerebral infarction affecting right non-dominant side: Secondary | ICD-10-CM | POA: Diagnosis not present

## 2018-10-04 DIAGNOSIS — I482 Chronic atrial fibrillation, unspecified: Secondary | ICD-10-CM | POA: Diagnosis not present

## 2018-10-04 DIAGNOSIS — E119 Type 2 diabetes mellitus without complications: Secondary | ICD-10-CM | POA: Diagnosis not present

## 2018-10-04 DIAGNOSIS — I69320 Aphasia following cerebral infarction: Secondary | ICD-10-CM | POA: Diagnosis not present

## 2018-10-04 DIAGNOSIS — I10 Essential (primary) hypertension: Secondary | ICD-10-CM | POA: Diagnosis not present

## 2018-10-04 DIAGNOSIS — E039 Hypothyroidism, unspecified: Secondary | ICD-10-CM | POA: Diagnosis not present

## 2018-10-08 DIAGNOSIS — I482 Chronic atrial fibrillation, unspecified: Secondary | ICD-10-CM | POA: Diagnosis not present

## 2018-10-08 DIAGNOSIS — E039 Hypothyroidism, unspecified: Secondary | ICD-10-CM | POA: Diagnosis not present

## 2018-10-08 DIAGNOSIS — E119 Type 2 diabetes mellitus without complications: Secondary | ICD-10-CM | POA: Diagnosis not present

## 2018-10-08 DIAGNOSIS — I10 Essential (primary) hypertension: Secondary | ICD-10-CM | POA: Diagnosis not present

## 2018-10-08 DIAGNOSIS — I69353 Hemiplegia and hemiparesis following cerebral infarction affecting right non-dominant side: Secondary | ICD-10-CM | POA: Diagnosis not present

## 2018-10-08 DIAGNOSIS — I69320 Aphasia following cerebral infarction: Secondary | ICD-10-CM | POA: Diagnosis not present

## 2018-10-10 ENCOUNTER — Ambulatory Visit: Payer: Medicare Other | Admitting: Neurology

## 2018-10-10 DIAGNOSIS — I69353 Hemiplegia and hemiparesis following cerebral infarction affecting right non-dominant side: Secondary | ICD-10-CM | POA: Diagnosis not present

## 2018-10-10 DIAGNOSIS — I69320 Aphasia following cerebral infarction: Secondary | ICD-10-CM | POA: Diagnosis not present

## 2018-10-10 DIAGNOSIS — E119 Type 2 diabetes mellitus without complications: Secondary | ICD-10-CM | POA: Diagnosis not present

## 2018-10-10 DIAGNOSIS — I10 Essential (primary) hypertension: Secondary | ICD-10-CM | POA: Diagnosis not present

## 2018-10-10 DIAGNOSIS — I482 Chronic atrial fibrillation, unspecified: Secondary | ICD-10-CM | POA: Diagnosis not present

## 2018-10-10 DIAGNOSIS — E039 Hypothyroidism, unspecified: Secondary | ICD-10-CM | POA: Diagnosis not present

## 2018-10-11 DIAGNOSIS — I69320 Aphasia following cerebral infarction: Secondary | ICD-10-CM | POA: Diagnosis not present

## 2018-10-11 DIAGNOSIS — I10 Essential (primary) hypertension: Secondary | ICD-10-CM | POA: Diagnosis not present

## 2018-10-11 DIAGNOSIS — E039 Hypothyroidism, unspecified: Secondary | ICD-10-CM | POA: Diagnosis not present

## 2018-10-11 DIAGNOSIS — I482 Chronic atrial fibrillation, unspecified: Secondary | ICD-10-CM | POA: Diagnosis not present

## 2018-10-11 DIAGNOSIS — I69353 Hemiplegia and hemiparesis following cerebral infarction affecting right non-dominant side: Secondary | ICD-10-CM | POA: Diagnosis not present

## 2018-10-11 DIAGNOSIS — E119 Type 2 diabetes mellitus without complications: Secondary | ICD-10-CM | POA: Diagnosis not present

## 2018-10-15 ENCOUNTER — Ambulatory Visit (INDEPENDENT_AMBULATORY_CARE_PROVIDER_SITE_OTHER): Payer: Medicare Other | Admitting: Nurse Practitioner

## 2018-10-15 ENCOUNTER — Other Ambulatory Visit: Payer: Self-pay

## 2018-10-15 ENCOUNTER — Encounter: Payer: Self-pay | Admitting: Nurse Practitioner

## 2018-10-15 DIAGNOSIS — I10 Essential (primary) hypertension: Secondary | ICD-10-CM | POA: Diagnosis not present

## 2018-10-15 DIAGNOSIS — E119 Type 2 diabetes mellitus without complications: Secondary | ICD-10-CM | POA: Diagnosis not present

## 2018-10-15 DIAGNOSIS — I6930 Unspecified sequelae of cerebral infarction: Secondary | ICD-10-CM | POA: Diagnosis not present

## 2018-10-15 DIAGNOSIS — N3090 Cystitis, unspecified without hematuria: Secondary | ICD-10-CM

## 2018-10-15 DIAGNOSIS — I69353 Hemiplegia and hemiparesis following cerebral infarction affecting right non-dominant side: Secondary | ICD-10-CM | POA: Diagnosis not present

## 2018-10-15 DIAGNOSIS — I482 Chronic atrial fibrillation, unspecified: Secondary | ICD-10-CM | POA: Diagnosis not present

## 2018-10-15 DIAGNOSIS — I69320 Aphasia following cerebral infarction: Secondary | ICD-10-CM | POA: Diagnosis not present

## 2018-10-15 DIAGNOSIS — E039 Hypothyroidism, unspecified: Secondary | ICD-10-CM | POA: Diagnosis not present

## 2018-10-15 MED ORDER — CIPROFLOXACIN HCL 250 MG PO TABS: 250.0000 mg | ORAL_TABLET | Freq: Two times a day (BID) | ORAL | 0 refills | Status: DC

## 2018-10-15 NOTE — Progress Notes (Signed)
   Virtual Visit via telephone Note Due to COVID-19 pandemic this visit was conducted virtually. This visit type was conducted due to national recommendations for restrictions regarding the COVID-19 Pandemic (e.g. social distancing, sheltering in place) in an effort to limit this patient's exposure and mitigate transmission in our community. All issues noted in this document were discussed and addressed.  A physical exam was not performed with this format.  I connected with [name] on 10/15/18 at 1:20 by telephone and verified that I am speaking with the correct person using two identifiers. [name] is currently located at home and her husband is currently with her during visit. The provider, Mary-Margaret Hassell Done, FNP is located in their office at time of visit.  I discussed the limitations, risks, security and privacy concerns of performing an evaluation and management service by telephone and the availability of in person appointments. I also discussed with the patient that there may be a patient responsible charge related to this service. The patient expressed understanding and agreed to proceed.   History and Present Illness:   Chief Complaint: Urinary Tract Infection   HPI patients husband calls in today for patient who is unable to talk n phone due to stroke. Husband states that patient has had dysuria , frequency and urgency. Started 2 days ago and is getting worse.     Review of Systems  Constitutional: Negative for diaphoresis and weight loss.  Eyes: Negative for blurred vision, double vision and pain.  Respiratory: Negative for shortness of breath.   Cardiovascular: Negative for chest pain, palpitations, orthopnea and leg swelling.  Gastrointestinal: Negative for abdominal pain.  Genitourinary: Positive for dysuria, frequency and urgency.  Skin: Negative for rash.  Neurological: Negative for dizziness, sensory change, loss of consciousness, weakness and headaches.   Endo/Heme/Allergies: Negative for polydipsia. Does not bruise/bleed easily.  Psychiatric/Behavioral: Negative for memory loss. The patient does not have insomnia.   All other systems reviewed and are negative.    Observations/Objective: Husband states patient is alert and oriented-  No distress  Assessment and Plan: [name] in today with chief complaint of Urinary Tract Infection   1. Recurrent cystitis Take medication as prescribe Cotton underwear Take shower not bath Cranberry juice, yogurt Force fluids AZO over the counter X2 days Try OTC cranberry pills RTO prn  - ciprofloxacin (CIPRO) 250 MG tablet; Take 1 tablet (250 mg total) by mouth 2 (two) times daily.  Dispense: 10 tablet; Refill: 0    Follow Up Instructions: prn    I discussed the assessment and treatment plan with the patient. The patient was provided an opportunity to ask questions and all were answered. The patient agreed with the plan and demonstrated an understanding of the instructions.   The patient was advised to call back or seek an in-person evaluation if the symptoms worsen or if the condition fails to improve as anticipated.  The above assessment and management plan was discussed with the patient. The patient verbalized understanding of and has agreed to the management plan. Patient is aware to call the clinic if symptoms persist or worsen. Patient is aware when to return to the clinic for a follow-up visit. Patient educated on when it is appropriate to go to the emergency department.   Time call ended:  1:32  I provided 12 minutes of non-face-to-face time during this encounter.    Mary-Margaret Hassell Done, FNP

## 2018-10-16 DIAGNOSIS — I10 Essential (primary) hypertension: Secondary | ICD-10-CM | POA: Diagnosis not present

## 2018-10-16 DIAGNOSIS — E039 Hypothyroidism, unspecified: Secondary | ICD-10-CM | POA: Diagnosis not present

## 2018-10-16 DIAGNOSIS — E119 Type 2 diabetes mellitus without complications: Secondary | ICD-10-CM | POA: Diagnosis not present

## 2018-10-16 DIAGNOSIS — I69320 Aphasia following cerebral infarction: Secondary | ICD-10-CM | POA: Diagnosis not present

## 2018-10-16 DIAGNOSIS — I482 Chronic atrial fibrillation, unspecified: Secondary | ICD-10-CM | POA: Diagnosis not present

## 2018-10-16 DIAGNOSIS — I69353 Hemiplegia and hemiparesis following cerebral infarction affecting right non-dominant side: Secondary | ICD-10-CM | POA: Diagnosis not present

## 2018-10-21 DIAGNOSIS — I482 Chronic atrial fibrillation, unspecified: Secondary | ICD-10-CM | POA: Diagnosis not present

## 2018-10-21 DIAGNOSIS — I10 Essential (primary) hypertension: Secondary | ICD-10-CM | POA: Diagnosis not present

## 2018-10-21 DIAGNOSIS — E039 Hypothyroidism, unspecified: Secondary | ICD-10-CM | POA: Diagnosis not present

## 2018-10-21 DIAGNOSIS — I69320 Aphasia following cerebral infarction: Secondary | ICD-10-CM | POA: Diagnosis not present

## 2018-10-21 DIAGNOSIS — E119 Type 2 diabetes mellitus without complications: Secondary | ICD-10-CM | POA: Diagnosis not present

## 2018-10-21 DIAGNOSIS — I69353 Hemiplegia and hemiparesis following cerebral infarction affecting right non-dominant side: Secondary | ICD-10-CM | POA: Diagnosis not present

## 2018-11-01 ENCOUNTER — Other Ambulatory Visit: Payer: Self-pay | Admitting: Family Medicine

## 2018-11-05 DIAGNOSIS — Z23 Encounter for immunization: Secondary | ICD-10-CM | POA: Diagnosis not present

## 2018-11-13 ENCOUNTER — Other Ambulatory Visit: Payer: Self-pay | Admitting: Family Medicine

## 2018-11-14 ENCOUNTER — Other Ambulatory Visit: Payer: Self-pay | Admitting: *Deleted

## 2018-11-14 ENCOUNTER — Ambulatory Visit (INDEPENDENT_AMBULATORY_CARE_PROVIDER_SITE_OTHER): Payer: Medicare Other | Admitting: Family Medicine

## 2018-11-14 ENCOUNTER — Encounter: Payer: Self-pay | Admitting: Family Medicine

## 2018-11-14 DIAGNOSIS — I480 Paroxysmal atrial fibrillation: Secondary | ICD-10-CM

## 2018-11-14 DIAGNOSIS — E034 Atrophy of thyroid (acquired): Secondary | ICD-10-CM

## 2018-11-14 DIAGNOSIS — F3342 Major depressive disorder, recurrent, in full remission: Secondary | ICD-10-CM

## 2018-11-14 DIAGNOSIS — E1159 Type 2 diabetes mellitus with other circulatory complications: Secondary | ICD-10-CM

## 2018-11-14 DIAGNOSIS — I10 Essential (primary) hypertension: Secondary | ICD-10-CM | POA: Diagnosis not present

## 2018-11-14 DIAGNOSIS — I6930 Unspecified sequelae of cerebral infarction: Secondary | ICD-10-CM | POA: Diagnosis not present

## 2018-11-14 DIAGNOSIS — Z9181 History of falling: Secondary | ICD-10-CM

## 2018-11-14 MED ORDER — LEVETIRACETAM 500 MG PO TABS: 500.0000 mg | ORAL_TABLET | Freq: Two times a day (BID) | ORAL | 0 refills | Status: DC

## 2018-11-14 MED ORDER — LEVOTHYROXINE SODIUM 88 MCG PO TABS: 88.0000 ug | ORAL_TABLET | Freq: Every day | ORAL | 3 refills | Status: DC

## 2018-11-14 MED ORDER — MIRTAZAPINE 15 MG PO TABS: 15.0000 mg | ORAL_TABLET | Freq: Every day | ORAL | 3 refills | Status: DC

## 2018-11-14 MED ORDER — RIVAROXABAN 20 MG PO TABS: 20.0000 mg | ORAL_TABLET | Freq: Every day | ORAL | 3 refills | Status: DC

## 2018-11-14 NOTE — Progress Notes (Signed)
Virtual Visit via telephone Note  I connected with [name] on 11/14/18 at 1530 by telephone and verified that I am speaking with the correct person using two identifiers. [name] is currently located at home and husband [name] are currently with her during visit. The provider, Fransisca Kaufmann Markian Glockner, MD is located in their office at time of visit.  Call ended at 1600  I discussed the limitations, risks, security and privacy concerns of performing an evaluation and management service by telephone and the availability of in person appointments. I also discussed with the patient that there may be a patient responsible charge related to this service. The patient expressed understanding and agreed to proceed.   History and Present Illness: Type 2 diabetes mellitus Patient comes in today for recheck of his diabetes. Patient has been currently taking glimepiride and metformin. Patient is not currently on an ACE inhibitor/ARB. Patient has not seen an ophthalmologist this year. Patient denies any issues with their feet.   Hypertension and afib Patient is currently on metoprolol, and their blood pressure today is unknown. Patient denies any lightheadedness or dizziness. Patient denies headaches, blurred vision, chest pains, shortness of breath, or weakness. Denies any side effects from medication and is content with current medication.   Hypothyroidism recheck Patient is coming in for thyroid recheck today as well. They deny any issues with hair changes or heat or cold problems or diarrhea or constipation. They deny any chest pain or palpitations. They are currently on levothyroxine 37micrograms   Patient is having a lot more instability and pain on the right side and she favors that side and she is a lot more unstable because of it. Patient is having pain in her lower right side. She fell back in June and had rib xray that is improving. She is still complaining of right hip pain.  She had been  doing home therapy and [facility] he felt that they did not help as much.   She has been gradually been having increasing memory issues. They have an appt with Dr Jaynee Eagles.   Mood and anxiety and depression. Patient says her mood has been stable and the medication is doing well on Remeron and citalopram.  They are happy with where she is at in her mood.  Her vision is worsening  No diagnosis found.  Outpatient Encounter Medications as of 11/14/2018  Medication Sig   acetaminophen (TYLENOL) 500 MG tablet Take 500 mg by mouth every 6 (six) hours as needed (pain).    Ascorbic Acid (VITAMIN C PO) Take 1 tablet by mouth daily.   Calcium Carbonate-Vitamin D (CALCIUM-VITAMIN D) 500-200 MG-UNIT per tablet Take 1 tablet by mouth daily.   cephALEXin (KEFLEX) 500 MG capsule    cholecalciferol (VITAMIN D) 1000 UNITS tablet Take 2,000 Units by mouth daily.    ciprofloxacin (CIPRO) 250 MG tablet Take 1 tablet (250 mg total) by mouth 2 (two) times daily.   citalopram (CELEXA) 20 MG tablet Take 1 tablet (20 mg total) by mouth 2 (two) times daily.   cyclobenzaprine (FLEXERIL) 5 MG tablet Take 1 tablet (5 mg total) by mouth 3 (three) times daily as needed for muscle spasms.   dicyclomine (BENTYL) 10 MG capsule TAKE 1 CAPSULE BY MOUTH FOUR TIMES DAILY BEFORE MEALS AND AT BEDTIME   fluticasone (FLONASE) 50 MCG/ACT nasal spray SHAKE LIQUID AND USE 2 SPRAYS IN EACH NOSTRIL DAILY   folic acid (FOLVITE) 1 MG tablet TAKE 1 TABLET(1 MG) BY MOUTH  DAILY (Patient taking differently: 2 mg. )   furosemide (LASIX) 20 MG tablet Take 1 tablet (20 mg total) by mouth daily.   glimepiride (AMARYL) 2 MG tablet Take 1 tablet (2 mg total) by mouth daily with breakfast.   glucose blood test strip TEST SUGAR DAILY   levETIRAcetam (KEPPRA) 500 MG tablet Take 1 tablet (500 mg total) by mouth 2 (two) times daily.   levothyroxine (SYNTHROID, LEVOTHROID) 88 MCG tablet TAKE 1 TABLET BY MOUTH EVERY DAY BEFORE BREAKFAST    loperamide (IMODIUM) 2 MG capsule Take by mouth as needed for diarrhea or loose stools (Take as directed). Reported on 05/17/2015   metFORMIN (GLUCOPHAGE-XR) 500 MG 24 hr tablet Take 2 tablets (1,000 mg total) by mouth 2 (two) times daily after a meal.   methotrexate (RHEUMATREX) 2.5 MG tablet Take 4 tablets (10 mg total) by mouth once a week. Caution:Chemotherapy. Protect from light.   metoprolol tartrate (LOPRESSOR) 50 MG tablet Take 1 tablet (50 mg total) by mouth 2 (two) times daily.   mirtazapine (REMERON) 15 MG tablet TAKE 1 TABLET(15 MG) BY MOUTH AT BEDTIME   Multiple Vitamins-Minerals (EYE VITAMINS PO) Take 2 tablets by mouth daily. AREDS formula   NYSTATIN powder APPLY TWICE DAILY UNDERNEATH BREASTS   omeprazole (PRILOSEC) 40 MG capsule TAKE 1 CAPSULE BY MOUTH EVERY DAY   potassium chloride SA (K-DUR,KLOR-CON) 20 MEQ tablet TAKE 2 TABLETS BY MOUTH TWICE DAILY   Probiotic Product (PROBIOTIC PO) Take 1 capsule by mouth daily.   simvastatin (ZOCOR) 40 MG tablet Take 1 tablet (40 mg total) by mouth daily.   traMADol (ULTRAM) 50 MG tablet Take 1/2 tab by mouth four times daily as needed for severe pain   XARELTO 20 MG TABS tablet TAKE 1 TABLET BY MOUTH EVERY DAY   No facility-administered encounter medications on file as of 11/14/2018.     Review of Systems  Constitutional: Negative for chills and fever.  Eyes: Positive for visual disturbance.  Respiratory: Negative for chest tightness and shortness of breath.   Cardiovascular: Negative for chest pain and leg swelling.  Musculoskeletal: Negative for back pain and gait problem.  Skin: Negative for rash.  Neurological: Positive for weakness. Negative for dizziness, light-headedness and headaches.  Psychiatric/Behavioral: Negative for agitation and behavioral problems.  All other systems reviewed and are negative.   Observations/Objective: Patient sounds comfortable and in no acute distress  Assessment and Plan: Problem  List Items Addressed This Visit      Cardiovascular and Mediastinum   HTN (hypertension)   Relevant Medications   rivaroxaban (XARELTO) 20 MG TABS tablet   Atrial fibrillation (HCC)   Relevant Medications   rivaroxaban (XARELTO) 20 MG TABS tablet     Endocrine   Hypothyroidism   Relevant Medications   levothyroxine (SYNTHROID) 88 MCG tablet   Type 2 diabetes mellitus ([place]) - Primary     Other   Late effects of cerebral ischemic stroke   Relevant Orders   Ambulatory referral to Physical Therapy   Depression   Relevant Medications   mirtazapine (REMERON) 15 MG tablet    Other Visit Diagnoses    At high risk for injury related to fall       Relevant Orders   Ambulatory referral to Physical Therapy       Follow Up Instructions:  She has macular degeneration and have been seeing ophthalmology  She is still using walker   I discussed the assessment and treatment plan with the patient. The patient was  provided an opportunity to ask questions and all were answered. The patient agreed with the plan and demonstrated an understanding of the instructions.   The patient was advised to call back or seek an in-person evaluation if the symptoms worsen or if the condition fails to improve as anticipated.  The above assessment and management plan was discussed with the patient. The patient verbalized understanding of and has agreed to the management plan. Patient is aware to call the clinic if symptoms persist or worsen. Patient is aware when to return to the clinic for a follow-up visit. Patient educated on when it is appropriate to go to the emergency department.    I provided 30 minutes of non-face-to-face time during this encounter.    Worthy Rancher, MD

## 2018-11-14 NOTE — Telephone Encounter (Signed)
Apt scheduled.  

## 2018-11-21 DIAGNOSIS — L409 Psoriasis, unspecified: Secondary | ICD-10-CM | POA: Diagnosis not present

## 2018-11-21 DIAGNOSIS — L821 Other seborrheic keratosis: Secondary | ICD-10-CM | POA: Diagnosis not present

## 2018-11-21 DIAGNOSIS — L853 Xerosis cutis: Secondary | ICD-10-CM | POA: Diagnosis not present

## 2018-11-22 ENCOUNTER — Ambulatory Visit (INDEPENDENT_AMBULATORY_CARE_PROVIDER_SITE_OTHER): Payer: Medicare Other | Admitting: Urology

## 2018-11-22 ENCOUNTER — Other Ambulatory Visit: Payer: Self-pay

## 2018-11-22 DIAGNOSIS — N3020 Other chronic cystitis without hematuria: Secondary | ICD-10-CM | POA: Diagnosis not present

## 2018-11-22 DIAGNOSIS — N3941 Urge incontinence: Secondary | ICD-10-CM | POA: Diagnosis not present

## 2018-11-22 DIAGNOSIS — R81 Glycosuria: Secondary | ICD-10-CM | POA: Diagnosis not present

## 2018-11-29 ENCOUNTER — Other Ambulatory Visit: Payer: Self-pay | Admitting: Family Medicine

## 2018-12-04 ENCOUNTER — Ambulatory Visit (INDEPENDENT_AMBULATORY_CARE_PROVIDER_SITE_OTHER): Payer: Medicare Other | Admitting: Physician Assistant

## 2018-12-04 ENCOUNTER — Encounter: Payer: Self-pay | Admitting: Physician Assistant

## 2018-12-04 DIAGNOSIS — M79606 Pain in leg, unspecified: Secondary | ICD-10-CM

## 2018-12-04 MED ORDER — CYCLOBENZAPRINE HCL 5 MG PO TABS: 5.0000 mg | ORAL_TABLET | Freq: Three times a day (TID) | ORAL | 0 refills | Status: AC | PRN

## 2018-12-04 MED ORDER — PREDNISONE 10 MG (21) PO TBPK: ORAL_TABLET | ORAL | 0 refills | Status: DC

## 2018-12-04 NOTE — Progress Notes (Signed)
Telephone visit  Subjective: CC:leg and hip pain PCP: Dettinger, Fransisca Kaufmann, MD [id number] [name] is a 73 y.o. female calls for telephone consult today. Patient provides verbal consent for consult held via phone.  Patient is identified with 2 separate identifiers.  At this time the entire area is on COVID-19 social distancing and stay home orders are in place.  Patient is of higher risk and therefore we are performing this by a virtual method.  Location of patient: home Location of provider: HOME Others present for call: husband   The patient is at home and her husband [name] is her caregiver.  She had a stroke sometime ago and is always been weak on the right side since then.  He reports that she fell about 3 weeks ago.  She was sitting on the commode.  She was leaning forward to pull her pants off from around her ankles and she lost her balance and fell forward.  She did have some bruising on her shoulder.  She has continued to have a lot of pain in that hip and torso area.  It is been quite severe on that side.  The fall affected her left side.  She is expected to start physical therapy at a physical therapy office in [place] on Monday.  They are going to try to still make it to that.  In the meantime we will go to use the medication to try to get things to calm down.  If things do not improve by late Friday afternoon they should call the physical therapy office and postpone the appointment for few more days.  ROS: Per HPI  Allergies  Allergen Reactions   Latex Rash   Caudal Tray [Lidocaine-Epinephrine]     Edema     Codeine Nausea Only   Diovan [Valsartan] Itching   Doxycycline Other (See Comments)    Might have caused heart to race per pt - not sure   Esomeprazole Magnesium     Headache     Levofloxacin     Insomnia     Myrbetriq [Mirabegron] Diarrhea   Neomycin-Bacitracin Zn-Polymyx     rash   Penicillins     hives   Sulfa Drugs Cross  Reactors Other (See Comments)    Pt not sure what reaction was   Verapamil     Edema    Vimovo [Naproxen-Esomeprazole]     Upset stomach    Past Medical History:  Diagnosis Date   Anemia    Bronchial spasms    Chronic anxiety    Chronic kidney disease    Complication of anesthesia    " I shake real bad "   Diverticulosis    Elevated blood sugar    Esophageal stricture    Family history of anesthesia complication    Daughter also shakes while waking up   GERD (gastroesophageal reflux disease)    H/O hiatal hernia    H/O scarlet fever    History of bladder repair surgery    Hx of vaginal hysterectomy    Hypertension    Hypothyroidism    IBS (irritable bowel syndrome)    vs diarrhea vs abd. fullness    Impaired glucose tolerance    Neuromuscular disorder (HCC)    periferal neuropathy   Pacemaker    St. Jude   Paroxysmal atrial fibrillation ([place])    a. failed flecainide, tikosyn, amio;  b. 01/2012 s/p RFCA.   Psoriasis    Psoriasis  Rectocele, female    Seizures ([place])    Stroke ([place])    Tachy-brady syndrome ([place])    a. 08/19/2008 s/p PPM: SJM 2110 Accent   Uterine prolapse    Vaginal prolapse 1998    Current Outpatient Medications:    acetaminophen (TYLENOL) 500 MG tablet, Take 500 mg by mouth every 6 (six) hours as needed (pain). , Disp: , Rfl:    Ascorbic Acid (VITAMIN C PO), Take 1 tablet by mouth daily., Disp: , Rfl:    Calcium Carbonate-Vitamin D (CALCIUM-VITAMIN D) 500-200 MG-UNIT per tablet, Take 1 tablet by mouth daily., Disp: , Rfl:    cephALEXin (KEFLEX) 500 MG capsule, , Disp: , Rfl:    cholecalciferol (VITAMIN D) 1000 UNITS tablet, Take 2,000 Units by mouth daily. , Disp: , Rfl:    ciprofloxacin (CIPRO) 250 MG tablet, Take 1 tablet (250 mg total) by mouth 2 (two) times daily., Disp: 10 tablet, Rfl: 0   citalopram (CELEXA) 20 MG tablet, Take 1 tablet (20 mg total) by mouth 2 (two) times daily., Disp: 180 tablet,  Rfl: 3   cyclobenzaprine (FLEXERIL) 5 MG tablet, Take 1 tablet (5 mg total) by mouth 3 (three) times daily as needed for muscle spasms., Disp: 30 tablet, Rfl: 0   dicyclomine (BENTYL) 10 MG capsule, TAKE 1 CAPSULE BY MOUTH FOUR TIMES DAILY, BEFORE MEALS AND AT BEDTIME, Disp: 360 capsule, Rfl: 0   fluticasone (FLONASE) 50 MCG/ACT nasal spray, SHAKE LIQUID AND USE 2 SPRAYS IN EACH NOSTRIL DAILY, Disp: 48 g, Rfl: 1   folic acid (FOLVITE) 1 MG tablet, TAKE 1 TABLET(1 MG) BY MOUTH DAILY (Patient taking differently: 2 mg. ), Disp: 90 tablet, Rfl: 0   furosemide (LASIX) 20 MG tablet, Take 1 tablet (20 mg total) by mouth daily., Disp: 90 tablet, Rfl: 3   glimepiride (AMARYL) 2 MG tablet, TAKE 1 TABLET(2 MG) BY MOUTH DAILY BEFORE BREAKFAST, Disp: 90 tablet, Rfl: 0   glucose blood test strip, TEST SUGAR DAILY, Disp: 100 each, Rfl: 3   levETIRAcetam (KEPPRA) 500 MG tablet, Take 1 tablet (500 mg total) by mouth 2 (two) times daily., Disp: 180 tablet, Rfl: 0   levothyroxine (SYNTHROID) 88 MCG tablet, Take 1 tablet (88 mcg total) by mouth daily before breakfast., Disp: 90 tablet, Rfl: 3   loperamide (IMODIUM) 2 MG capsule, Take by mouth as needed for diarrhea or loose stools (Take as directed). Reported on 05/17/2015, Disp: , Rfl:    metFORMIN (GLUCOPHAGE-XR) 500 MG 24 hr tablet, Take 2 tablets (1,000 mg total) by mouth 2 (two) times daily after a meal., Disp: 180 tablet, Rfl: 1   methotrexate (RHEUMATREX) 2.5 MG tablet, Take 4 tablets (10 mg total) by mouth once a week. Caution:Chemotherapy. Protect from light., Disp: 16 tablet, Rfl: 1   metoprolol tartrate (LOPRESSOR) 50 MG tablet, Take 1 tablet (50 mg total) by mouth 2 (two) times daily., Disp: 180 tablet, Rfl: 3   mirtazapine (REMERON) 15 MG tablet, Take 1 tablet (15 mg total) by mouth at bedtime., Disp: 90 tablet, Rfl: 3   Multiple Vitamins-Minerals (EYE VITAMINS PO), Take 2 tablets by mouth daily. AREDS formula, Disp: , Rfl:    NYSTATIN  powder, APPLY TWICE DAILY UNDERNEATH BREASTS, Disp: 60 g, Rfl: 0   omeprazole (PRILOSEC) 40 MG capsule, TAKE 1 CAPSULE BY MOUTH EVERY DAY, Disp: 90 capsule, Rfl: 1   potassium chloride SA (KLOR-CON) 20 MEQ tablet, TAKE 2 TABLETS BY MOUTH TWICE DAILY, Disp: 360 tablet, Rfl: 0   predniSONE (  STERAPRED UNI-PAK 21 TAB) 10 MG (21) TBPK tablet, As directed x 6 days, Disp: 21 tablet, Rfl: 0   Probiotic Product (PROBIOTIC PO), Take 1 capsule by mouth daily., Disp: , Rfl:    rivaroxaban (XARELTO) 20 MG TABS tablet, Take 1 tablet (20 mg total) by mouth daily., Disp: 90 tablet, Rfl: 3   simvastatin (ZOCOR) 40 MG tablet, Take 1 tablet (40 mg total) by mouth daily., Disp: 90 tablet, Rfl: 1   traMADol (ULTRAM) 50 MG tablet, Take 1/2 tab by mouth four times daily as needed for severe pain, Disp: , Rfl:   Assessment/ Plan: 73 y.o. female   1. Pain of lower extremity, unspecified laterality - predniSONE (STERAPRED UNI-PAK 21 TAB) 10 MG (21) TBPK tablet; As directed x 6 days  Dispense: 21 tablet; Refill: 0 - cyclobenzaprine (FLEXERIL) 5 MG tablet; Take 1 tablet (5 mg total) by mouth 3 (three) times daily as needed for muscle spasms.  Dispense: 30 tablet; Refill: 0   No follow-ups on file.  Continue all other maintenance medications as listed above.  Start time: 11:14 AM End time: 11:29 AM  Meds ordered this encounter  Medications   predniSONE (STERAPRED UNI-PAK 21 TAB) 10 MG (21) TBPK tablet    Sig: As directed x 6 days    Dispense:  21 tablet    Refill:  0    Order Specific Question:   Supervising Provider    Answer:   Janora Norlander [id number]   cyclobenzaprine (FLEXERIL) 5 MG tablet    Sig: Take 1 tablet (5 mg total) by mouth 3 (three) times daily as needed for muscle spasms.    Dispense:  30 tablet    Refill:  0    Order Specific Question:   Supervising Provider    Answer:   Janora Norlander [id number]    Particia Nearing PA-C [place] [phone]

## 2018-12-09 ENCOUNTER — Telehealth: Payer: Self-pay | Admitting: Physician Assistant

## 2018-12-09 ENCOUNTER — Other Ambulatory Visit: Payer: Self-pay | Admitting: Physician Assistant

## 2018-12-09 MED ORDER — CEPHALEXIN 250 MG PO CAPS: 250.0000 mg | ORAL_CAPSULE | Freq: Three times a day (TID) | ORAL | 0 refills | Status: DC

## 2018-12-09 NOTE — Telephone Encounter (Signed)
Please advise 

## 2018-12-09 NOTE — Telephone Encounter (Signed)
Sent keflex 250 mg tid 10 days

## 2018-12-09 NOTE — Telephone Encounter (Signed)
Patients husband notified and verbalized understanding 

## 2018-12-12 ENCOUNTER — Encounter: Payer: Medicare Other | Admitting: *Deleted

## 2018-12-18 ENCOUNTER — Telehealth: Payer: Self-pay | Admitting: Family Medicine

## 2018-12-18 NOTE — Telephone Encounter (Signed)
Last labs faxed to Dr. Lyman Speller at [phone] Patients husband aware.

## 2018-12-26 ENCOUNTER — Other Ambulatory Visit: Payer: Self-pay

## 2018-12-26 ENCOUNTER — Ambulatory Visit (INDEPENDENT_AMBULATORY_CARE_PROVIDER_SITE_OTHER): Payer: Medicare Other

## 2018-12-26 ENCOUNTER — Ambulatory Visit (INDEPENDENT_AMBULATORY_CARE_PROVIDER_SITE_OTHER): Payer: Medicare Other | Admitting: Physician Assistant

## 2018-12-26 ENCOUNTER — Encounter: Payer: Self-pay | Admitting: Physician Assistant

## 2018-12-26 VITALS — BP 151/77 | HR 78 | Temp 97.3°F | Ht 64.0 in

## 2018-12-26 DIAGNOSIS — R6889 Other general symptoms and signs: Secondary | ICD-10-CM

## 2018-12-26 DIAGNOSIS — I10 Essential (primary) hypertension: Secondary | ICD-10-CM

## 2018-12-26 DIAGNOSIS — G8311 Monoplegia of lower limb affecting right dominant side: Secondary | ICD-10-CM | POA: Diagnosis not present

## 2018-12-26 DIAGNOSIS — E1159 Type 2 diabetes mellitus with other circulatory complications: Secondary | ICD-10-CM

## 2018-12-26 DIAGNOSIS — I6930 Unspecified sequelae of cerebral infarction: Secondary | ICD-10-CM

## 2018-12-26 DIAGNOSIS — M25551 Pain in right hip: Secondary | ICD-10-CM

## 2018-12-26 DIAGNOSIS — S79911A Unspecified injury of right hip, initial encounter: Secondary | ICD-10-CM | POA: Diagnosis not present

## 2018-12-26 DIAGNOSIS — I6349 Cerebral infarction due to embolism of other cerebral artery: Secondary | ICD-10-CM

## 2018-12-26 DIAGNOSIS — W19XXXA Unspecified fall, initial encounter: Secondary | ICD-10-CM

## 2018-12-26 LAB — BAYER DCA HB A1C WAIVED: HB A1C (BAYER DCA - WAIVED): 8.9 % — ABNORMAL HIGH (ref <7.0)

## 2018-12-26 NOTE — Progress Notes (Signed)
BP (!) 151/77   Pulse 78   Temp (!) 97.3 F (36.3 C) (Temporal)   Ht '5\' 4"'$  (1.626 m)   SpO2 95%   BMI 30.04 kg/m    Subjective:    Patient ID: [name], female    DOB: 02-Feb-1946, 73 y.o.   MRN: [id number]  HPI: [name] is a 73 y.o. female presenting on 12/26/2018 for Hip Pain   I am seeing this patient for Dr. Warrick Parisian.  She has had a stroke in the past and is left with right-sided weakness.  She does get frequent urinary tract infections.  However the most significant thing was a fall yesterday.  They had been out for an appointment as she had been up quite a long time so this may be reason that she just had poor endurance.  The most pronounced pain is on her right hip.   The fall was not really due to anything in particular.  Her husband is here as historian.  She is partly nonverbal.  A few weeks ago she had had a fall and an infection and had to postpone doing physical therapy in the outpatient setting.  And she has continued to get weaker.  The family is concerned about other things going on.  We will have labs performed today.  Him to speak with her PCP about getting an order put in for an evaluation of home health from any company other than [facility].  They had this company before and were disappointed in the care.  I explained to them that a nurse will come and evaluate and help decide what services are best including physical therapy, Occupational Therapy, home aide.  While she is here today she does not seem to be in any severe amount of pain.  X-ray was performed and there are no obvious fractures Past Medical History:  Diagnosis Date  . Anemia   . Bronchial spasms   . Chronic anxiety   . Chronic kidney disease   . Complication of anesthesia    " I shake real bad "  . Diverticulosis   . Elevated blood sugar   . Esophageal stricture   . Family history of anesthesia complication    Daughter also shakes while waking up  . GERD (gastroesophageal reflux disease)    . H/O hiatal hernia   . H/O scarlet fever   . History of bladder repair surgery   . Hx of vaginal hysterectomy   . Hypertension   . Hypothyroidism   . IBS (irritable bowel syndrome)    vs diarrhea vs abd. fullness   . Impaired glucose tolerance   . Neuromuscular disorder ([place])    periferal neuropathy  . Pacemaker    St. Jude  . Paroxysmal atrial fibrillation (HCC)    a. failed flecainide, tikosyn, amio;  b. 01/2012 s/p RFCA.  . Psoriasis   . Psoriasis   . Rectocele, female   . Seizures ([place])   . Stroke ([place])   . Tachy-brady syndrome ([place])    a. 08/19/2008 s/p PPM: SJM 2110 Accent  . Uterine prolapse   . Vaginal prolapse 1998   Relevant past medical, surgical, family and social history reviewed and updated as indicated. Interim medical history since our last visit reviewed. Allergies and medications reviewed and updated. DATA REVIEWED: CHART IN EPIC  Family History reviewed for pertinent findings.  Review of Systems  Constitutional: Negative.  Negative for activity change, fatigue and fever.  HENT: Negative.  Eyes: Negative.   Respiratory: Negative.  Negative for cough.   Cardiovascular: Negative.  Negative for chest pain.  Gastrointestinal: Negative.  Negative for abdominal pain.  Endocrine: Negative.   Genitourinary: Negative.  Negative for dysuria.  Musculoskeletal: Positive for arthralgias, joint swelling and myalgias.  Skin: Negative.   Neurological: Positive for weakness.  Psychiatric/Behavioral: Positive for dysphoric mood.    Allergies as of 12/26/2018      Reactions   Latex Rash   Caudal Tray [lidocaine-epinephrine]    Edema    Codeine Nausea Only   Diovan [valsartan] Itching   Doxycycline Other (See Comments)   Might have caused heart to race per pt - not sure   Esomeprazole Magnesium    Headache    Levofloxacin    Insomnia    Myrbetriq [mirabegron] Diarrhea   Neomycin-bacitracin Zn-polymyx    rash   Penicillins    hives   Sulfa Drugs Cross  Reactors Other (See Comments)   Pt not sure what reaction was   Verapamil    Edema   Vimovo [naproxen-esomeprazole]    Upset stomach      Medication List       Accurate as of December 26, 2018  2:16 PM. If you have any questions, ask your nurse or doctor.        STOP taking these medications   cephALEXin 250 MG capsule Commonly known as: KEFLEX Stopped by: Terald Sleeper, PA-C   ciprofloxacin 250 MG tablet Commonly known as: Cipro Stopped by: Terald Sleeper, PA-C   predniSONE 10 MG (21) Tbpk tablet Commonly known as: STERAPRED UNI-PAK 21 TAB Stopped by: Terald Sleeper, PA-C     TAKE these medications   acetaminophen 500 MG tablet Commonly known as: TYLENOL Take 500 mg by mouth every 6 (six) hours as needed (pain).   calcium-vitamin D 500-200 MG-UNIT tablet Take 1 tablet by mouth daily.   cholecalciferol 1000 units tablet Commonly known as: VITAMIN D Take 2,000 Units by mouth daily.   citalopram 20 MG tablet Commonly known as: CELEXA Take 1 tablet (20 mg total) by mouth 2 (two) times daily.   cyclobenzaprine 5 MG tablet Commonly known as: FLEXERIL Take 1 tablet (5 mg total) by mouth 3 (three) times daily as needed for muscle spasms.   dicyclomine 10 MG capsule Commonly known as: BENTYL TAKE 1 CAPSULE BY MOUTH FOUR TIMES DAILY, BEFORE MEALS AND AT BEDTIME   EYE VITAMINS PO Take 2 tablets by mouth daily. AREDS formula   fluticasone 50 MCG/ACT nasal spray Commonly known as: FLONASE SHAKE LIQUID AND USE 2 SPRAYS IN EACH NOSTRIL DAILY   folic acid 1 MG tablet Commonly known as: FOLVITE TAKE 1 TABLET(1 MG) BY MOUTH DAILY What changed: See the new instructions.   furosemide 20 MG tablet Commonly known as: LASIX Take 1 tablet (20 mg total) by mouth daily.   glimepiride 2 MG tablet Commonly known as: AMARYL TAKE 1 TABLET(2 MG) BY MOUTH DAILY BEFORE BREAKFAST   glucose blood test strip TEST SUGAR DAILY   levETIRAcetam 500 MG tablet Commonly known as:  KEPPRA Take 1 tablet (500 mg total) by mouth 2 (two) times daily.   levothyroxine 88 MCG tablet Commonly known as: SYNTHROID Take 1 tablet (88 mcg total) by mouth daily before breakfast.   loperamide 2 MG capsule Commonly known as: IMODIUM Take by mouth as needed for diarrhea or loose stools (Take as directed). Reported on 05/17/2015   metFORMIN 500 MG 24 hr tablet Commonly  known as: GLUCOPHAGE-XR Take 2 tablets (1,000 mg total) by mouth 2 (two) times daily after a meal.   methotrexate 2.5 MG tablet Commonly known as: RHEUMATREX Take 4 tablets (10 mg total) by mouth once a week. Caution:Chemotherapy. Protect from light.   metoprolol tartrate 50 MG tablet Commonly known as: LOPRESSOR Take 1 tablet (50 mg total) by mouth 2 (two) times daily.   mirtazapine 15 MG tablet Commonly known as: REMERON Take 1 tablet (15 mg total) by mouth at bedtime.   nystatin powder Generic drug: nystatin APPLY TWICE DAILY UNDERNEATH BREASTS   omeprazole 40 MG capsule Commonly known as: PRILOSEC TAKE 1 CAPSULE BY MOUTH EVERY DAY   potassium chloride SA 20 MEQ tablet Commonly known as: KLOR-CON TAKE 2 TABLETS BY MOUTH TWICE DAILY   PROBIOTIC PO Take 1 capsule by mouth daily.   rivaroxaban 20 MG Tabs tablet Commonly known as: Xarelto Take 1 tablet (20 mg total) by mouth daily.   simvastatin 40 MG tablet Commonly known as: ZOCOR Take 1 tablet (40 mg total) by mouth daily.   traMADol 50 MG tablet Commonly known as: ULTRAM Take 1/2 tab by mouth four times daily as needed for severe pain   VITAMIN C PO Take 1 tablet by mouth daily.          Objective:    BP (!) 151/77   Pulse 78   Temp (!) 97.3 F (36.3 C) (Temporal)   Ht '5\' 4"'$  (1.626 m)   SpO2 95%   BMI 30.04 kg/m   Allergies  Allergen Reactions  . Latex Rash  . Caudal Tray [Lidocaine-Epinephrine]     Edema    . Codeine Nausea Only  . Diovan [Valsartan] Itching  . Doxycycline Other (See Comments)    Might have caused  heart to race per pt - not sure  . Esomeprazole Magnesium     Headache    . Levofloxacin     Insomnia    . Myrbetriq [Mirabegron] Diarrhea  . Neomycin-Bacitracin Zn-Polymyx     rash  . Penicillins     hives  . Sulfa Drugs Cross Reactors Other (See Comments)    Pt not sure what reaction was  . Verapamil     Edema   . Vimovo [Naproxen-Esomeprazole]     Upset stomach     Wt Readings from Last 3 Encounters:  08/19/18 175 lb (79.4 kg)  02/27/18 169 lb (76.7 kg)  10/17/17 177 lb (80.3 kg)    Physical Exam Constitutional:      General: She is not in acute distress.    Appearance: Normal appearance. She is well-developed.  HENT:     Head: Normocephalic and atraumatic.  Cardiovascular:     Rate and Rhythm: Normal rate.  Pulmonary:     Effort: Pulmonary effort is normal.  Musculoskeletal:     Right hip: She exhibits normal range of motion, normal strength and no deformity.     Right knee: She exhibits normal range of motion, no swelling and no deformity.  Skin:    General: Skin is warm and dry.     Findings: No rash.  Neurological:     Mental Status: She is alert and oriented to person, place, and time.     Deep Tendon Reflexes: Reflexes are normal and symmetric.         Assessment & Plan:   1. Fall, initial encounter - DG HIP UNILAT WITH PELVIS 2-3 VIEWS RIGHT; Future  2. Cerebral infarction due to embolism  of other cerebral artery ([place]) Home health referral  3. Paralysis of right lower extremity (HCC) Home health referral  4. Clinical decompensation Home health referral  5. Essential hypertension - CBC with Differential/Platelet - CMP14+EGFR - Lipid Panel - TSH - hgba1c  6. Type 2 diabetes mellitus with other circulatory complication, without long-term current use of insulin (HCC) - CBC with Differential/Platelet - CMP14+EGFR - Lipid Panel - TSH - hgba1c   Continue all other maintenance medications as listed above.  Follow up plan: Return in  about 3 weeks (around 01/16/2019) for Follow up with DETTINGER please.  Educational handout given for [place] PA-C [facility] [address]  [place], [place] [address] [phone]   12/26/2018, 2:16 PM

## 2018-12-27 LAB — CMP14+EGFR
ALT: 19 IU/L (ref 0–32)
AST: 36 IU/L (ref 0–40)
Albumin/Globulin Ratio: 1.6 (ref 1.2–2.2)
Albumin: 3.6 g/dL — ABNORMAL LOW (ref 3.7–4.7)
Alkaline Phosphatase: 70 IU/L (ref 39–117)
BUN/Creatinine Ratio: 11 — ABNORMAL LOW (ref 12–28)
BUN: 6 mg/dL — ABNORMAL LOW (ref 8–27)
Bilirubin Total: 1 mg/dL (ref 0.0–1.2)
CO2: 22 mmol/L (ref 20–29)
Calcium: 9.7 mg/dL (ref 8.7–10.3)
Chloride: 103 mmol/L (ref 96–106)
Creatinine, Ser: 0.57 mg/dL (ref 0.57–1.00)
GFR calc Af Amer: 108 mL/min/{1.73_m2} (ref >59)
GFR calc non Af Amer: 94 mL/min/{1.73_m2} (ref >59)
Globulin, Total: 2.3 g/dL (ref 1.5–4.5)
Glucose: 184 mg/dL — ABNORMAL HIGH (ref 65–99)
Potassium: 5 mmol/L (ref 3.5–5.2)
Sodium: 141 mmol/L (ref 134–144)
Total Protein: 5.9 g/dL — ABNORMAL LOW (ref 6.0–8.5)

## 2018-12-27 LAB — CBC WITH DIFFERENTIAL/PLATELET
Basophils Absolute: 0.1 10*3/uL (ref 0.0–0.2)
Basos: 1 %
EOS (ABSOLUTE): 0.3 10*3/uL (ref 0.0–0.4)
Eos: 5 %
Hematocrit: 39.8 % (ref 34.0–46.6)
Hemoglobin: 13 g/dL (ref 11.1–15.9)
Immature Grans (Abs): 0 10*3/uL (ref 0.0–0.1)
Immature Granulocytes: 0 %
Lymphocytes Absolute: 2.4 10*3/uL (ref 0.7–3.1)
Lymphs: 40 %
MCH: 31 pg (ref 26.6–33.0)
MCHC: 32.7 g/dL (ref 31.5–35.7)
MCV: 95 fL (ref 79–97)
Monocytes Absolute: 0.6 10*3/uL (ref 0.1–0.9)
Monocytes: 11 %
Neutrophils Absolute: 2.6 10*3/uL (ref 1.4–7.0)
Neutrophils: 43 %
Platelets: 198 10*3/uL (ref 150–450)
RBC: 4.2 x10E6/uL (ref 3.77–5.28)
RDW: 15.3 % (ref 11.7–15.4)
WBC: 5.9 10*3/uL (ref 3.4–10.8)

## 2018-12-27 LAB — LIPID PANEL
Chol/HDL Ratio: 3 ratio (ref 0.0–4.4)
Cholesterol, Total: 80 mg/dL — ABNORMAL LOW (ref 100–199)
HDL: 27 mg/dL — ABNORMAL LOW (ref >39)
LDL Chol Calc (NIH): 34 mg/dL (ref 0–99)
Triglycerides: 94 mg/dL (ref 0–149)
VLDL Cholesterol Cal: 19 mg/dL (ref 5–40)

## 2018-12-27 LAB — TSH: TSH: 1.51 u[IU]/mL (ref 0.450–4.500)

## 2018-12-30 ENCOUNTER — Other Ambulatory Visit: Payer: Self-pay

## 2018-12-30 ENCOUNTER — Telehealth: Payer: Self-pay | Admitting: Family Medicine

## 2018-12-30 ENCOUNTER — Ambulatory Visit: Payer: Medicare Other | Admitting: Family Medicine

## 2018-12-30 NOTE — Telephone Encounter (Signed)
**  [facility] After Hours/ Emergency Line Call**  Patient: [name].  PCP: Dettinger, Fransisca Kaufmann, MD  Patient's husband calls to inform that [name] sustained a fall last evening.  He notes that she is unable to move her arm without significant pain.  He is wondering if he should bring her to the emergency department.  At baseline she does have limited mobility and relies on a cane.  She is unable to use a walker at baseline.  He is considering using a wheelchair.  I agree that she should be evaluated emergency department to rule out fracture or dislocation.  Will forward to PCP.  Kashon Kraynak M. Lajuana Ripple, DO

## 2018-12-30 NOTE — Telephone Encounter (Signed)
Patient fell Saturday. They called on call provider and they told him to take her to ER, but she would not go. Patient is scheduled today at 1:55 with [facility].

## 2018-12-31 ENCOUNTER — Ambulatory Visit (INDEPENDENT_AMBULATORY_CARE_PROVIDER_SITE_OTHER): Payer: Medicare Other | Admitting: Family

## 2018-12-31 ENCOUNTER — Ambulatory Visit (INDEPENDENT_AMBULATORY_CARE_PROVIDER_SITE_OTHER): Payer: Medicare Other

## 2018-12-31 ENCOUNTER — Other Ambulatory Visit: Payer: Self-pay | Admitting: Family

## 2018-12-31 ENCOUNTER — Encounter: Payer: Self-pay | Admitting: Family

## 2018-12-31 VITALS — BP 134/69 | HR 80 | Temp 97.1°F

## 2018-12-31 DIAGNOSIS — R5381 Other malaise: Secondary | ICD-10-CM

## 2018-12-31 DIAGNOSIS — S42412A Displaced simple supracondylar fracture without intercondylar fracture of left humerus, initial encounter for closed fracture: Secondary | ICD-10-CM | POA: Diagnosis not present

## 2018-12-31 DIAGNOSIS — R531 Weakness: Secondary | ICD-10-CM

## 2018-12-31 DIAGNOSIS — S72461A Displaced supracondylar fracture with intracondylar extension of lower end of right femur, initial encounter for closed fracture: Secondary | ICD-10-CM | POA: Diagnosis not present

## 2018-12-31 DIAGNOSIS — I6930 Unspecified sequelae of cerebral infarction: Secondary | ICD-10-CM

## 2018-12-31 DIAGNOSIS — M25521 Pain in right elbow: Secondary | ICD-10-CM

## 2018-12-31 DIAGNOSIS — M7989 Other specified soft tissue disorders: Secondary | ICD-10-CM | POA: Diagnosis not present

## 2018-12-31 DIAGNOSIS — R296 Repeated falls: Secondary | ICD-10-CM

## 2018-12-31 DIAGNOSIS — I6349 Cerebral infarction due to embolism of other cerebral artery: Secondary | ICD-10-CM | POA: Diagnosis not present

## 2018-12-31 DIAGNOSIS — M79631 Pain in right forearm: Secondary | ICD-10-CM | POA: Diagnosis not present

## 2018-12-31 DIAGNOSIS — S8992XD Unspecified injury of left lower leg, subsequent encounter: Secondary | ICD-10-CM | POA: Diagnosis not present

## 2018-12-31 DIAGNOSIS — S59911A Unspecified injury of right forearm, initial encounter: Secondary | ICD-10-CM | POA: Diagnosis not present

## 2018-12-31 MED ORDER — TRAMADOL HCL 50 MG PO TABS: 50.0000 mg | ORAL_TABLET | Freq: Four times a day (QID) | ORAL | 1 refills | Status: DC | PRN

## 2018-12-31 NOTE — Patient Instructions (Signed)
Weakness °Weakness is a lack of strength. You may feel weak all over your body (generalized), or you may feel weak in one specific part of your body (focal). Common causes of weakness include: °· Infection and immune system disorders. °· Physical exhaustion. °· Internal bleeding or other blood loss that results in a lack of red blood cells (anemia). °· Dehydration. °· An imbalance in mineral (electrolyte) levels, such as potassium. °· Heart disease, circulation problems, or stroke. °Other causes include: °· Some medicines or cancer treatment. °· Stress, anxiety, or depression. °· Nervous system disorders. °· Thyroid disorders. °· Loss of muscle strength because of age or inactivity. °· Poor sleep quality or sleep disorders. °The cause of your weakness may not be known. Some causes of weakness can be serious, so it is important to see your health care provider. °Follow these instructions at home: °Activity °· Rest as needed. °· Try to get enough sleep. Most adults need 7-8 hours of quality sleep each night. Talk to your health care provider about how much sleep you need each night. °· Do exercises, such as arm curls and leg raises, for 30 minutes at least 2 days a week or as told by your health care provider. This helps build muscle strength. °· Consider working with a physical therapist or trainer who can develop an exercise plan to help you gain muscle strength. °General instructions ° °· Take over-the-counter and prescription medicines only as told by your health care provider. °· Eat a healthy, well-balanced diet. This includes: °? Proteins to build muscles, such as lean meats and fish. °? Fresh fruits and vegetables. °? Carbohydrates to boost energy, such as whole grains. °· Drink enough fluid to keep your urine pale yellow. °· Keep all follow-up visits as told by your health care provider. This is important. °Contact a health care provider if your weakness: °· Does not improve or gets worse. °· Affects your  ability to think clearly. °· Affects your ability to do your normal daily activities. °Get help right away if you: °· Develop sudden weakness, especially on one side of your face or body. °· Have chest pain. °· Have trouble breathing or shortness of breath. °· Have problems with your vision. °· Have trouble talking or swallowing. °· Have trouble standing or walking. °· Are light-headed or lose consciousness. °Summary °· Weakness is a lack of strength. You may feel weak all over your body or just in one specific part of your body. °· Weakness can be caused by a variety of things. In some cases, the cause may be unknown. °· Rest as needed, and try to get enough sleep. Most adults need 7-8 hours of quality sleep each night. °· Eat a healthy, well-balanced diet. °This information is not intended to replace advice given to you by your health care provider. Make sure you discuss any questions you have with your health care provider. °Document Released: 02/06/2005 Document Revised: 09/12/2017 Document Reviewed: 09/12/2017 °Elsevier Patient Education © 2020 [employer] ° °

## 2018-12-31 NOTE — Progress Notes (Signed)
Subjective:    Patient ID: [name], female    DOB: 1945-05-02, 73 y.o.   MRN: [id number]  HPI Chief Complaint  Patient presents with  . follow up from having a  fall    blood in urine and feces for 3 days   PT presents to the office today with generalized weakness and frequently falling. She had a history of a stroke with right side weakness 5 years ago.   Husband states she fell Saturday and hit a coffee table.    Husband also reports intermittent bight red blood. Unsure if this is vaginal or rectally. She denies any dysuria, or pain with BM.  Fall The accident occurred 5 to 7 days ago. The fall occurred while standing. There was no blood loss. The point of impact was the right elbow. The pain is present in the right elbow. The pain is at a severity of 8/10. The pain is moderate. The symptoms are aggravated by rotation and movement. Pertinent negatives include no numbness or tingling. She has tried acetaminophen and rest for the symptoms. The treatment provided mild relief.      Review of Systems  Neurological: Positive for weakness. Negative for tingling and numbness.  All other systems reviewed and are negative.       Objective:   Physical Exam Vitals signs reviewed.  Constitutional:      General: She is not in acute distress.    Appearance: She is well-developed.  HENT:     Head: Normocephalic and atraumatic.     Right Ear: Tympanic membrane normal.     Left Ear: Tympanic membrane normal.  Eyes:     Pupils: Pupils are equal, round, and reactive to light.  Neck:     Musculoskeletal: Normal range of motion and neck supple.     Thyroid: No thyromegaly.  Cardiovascular:     Rate and Rhythm: Normal rate and regular rhythm.     Heart sounds: Normal heart sounds. No murmur.  Pulmonary:     Effort: Pulmonary effort is normal. No respiratory distress.     Breath sounds: Normal breath sounds. No wheezing.  Abdominal:     General: Bowel sounds are normal.  There is no distension.     Palpations: Abdomen is soft.     Tenderness: There is no abdominal tenderness.  Musculoskeletal:        General: Deformity present. No tenderness.  Skin:    General: Skin is warm and dry.  Neurological:     Mental Status: She is alert and oriented to person, place, and time.     Cranial Nerves: No cranial nerve deficit.     Motor: Weakness present.     Gait: Gait abnormal.     Deep Tendon Reflexes: Reflexes are normal and symmetric.     Comments: Right weakness present, right elbow bruising and swelling, wheelchair bound  Psychiatric:        Behavior: Behavior normal.        Thought Content: Thought content normal.        Judgment: Judgment normal.     BP 134/69   Pulse 80   Temp (!) 97.1 F (36.2 C) (Temporal)   SpO2 95%      Assessment & Plan:  BEVELY MILIUS comes in today with chief complaint of follow up from having a  fall (blood in urine and feces for 3 days)   Diagnosis and orders addressed:  1. Late effects of cerebral ischemic stroke -  Ambulatory referral to [place] - Ambulatory referral to Physical Therapy  2. Frequent falls - Ambulatory referral to [place] - Ambulatory referral to Physical Therapy  3. Weakness - Ambulatory referral to Home Health - Ambulatory referral to Physical Therapy  4. Right elbow pain - Ambulatory referral to [place] - Ambulatory referral to Physical Therapy - traMADol (ULTRAM) 50 MG tablet; Take 1 tablet (50 mg total) by mouth every 6 (six) hours as needed. Take 1/2 tab by mouth four times daily as needed for severe pain  Dispense: 90 tablet; Refill: 1 - Ambulatory referral to Orthopedic Surgery  5. Injury of left knee, subsequent encounter - Ambulatory referral to [place] - Ambulatory referral to Physical Therapy - traMADol (ULTRAM) 50 MG tablet; Take 1 tablet (50 mg total) by mouth every 6 (six) hours as needed. Take 1/2 tab by mouth four times daily as needed for severe pain   Dispense: 90 tablet; Refill: 1  6. Physical deconditioning  7. Closed displaced supracondylar fracture of distal end of right femur with intracondylar extension, initial encounter [facility]) - Ambulatory referral to Orthopedic Surgery  Long discussion with patient, husband, and daughter about realistic goals. She needs to be in a SNF or have hired help, as her current caregiver will not be able to do it much longer. Her husband is in tears stating he can not continue to care for her like she is. He reports he can not leave the house or even the room and the emotional stress and physical stress it is causing on him.   Fall preventions discussed Stat ortho referral placed Will order PT and home health and the family will start looking personal sitters and SNF/rehab locations Pt will follow up with PCP about blood in stools. I believe this is a hemorrhoid.  I did refill her Ultram and reviewed in the [place] controlled database- No red flags noted.   Evelina Dun, FNP   Evelina Dun, [place]

## 2019-01-01 ENCOUNTER — Telehealth: Payer: Self-pay | Admitting: Family Medicine

## 2019-01-02 DIAGNOSIS — Z8673 Personal history of transient ischemic attack (TIA), and cerebral infarction without residual deficits: Secondary | ICD-10-CM | POA: Diagnosis not present

## 2019-01-02 DIAGNOSIS — M25521 Pain in right elbow: Secondary | ICD-10-CM | POA: Diagnosis not present

## 2019-01-20 DIAGNOSIS — M25521 Pain in right elbow: Secondary | ICD-10-CM | POA: Diagnosis not present

## 2019-01-20 DIAGNOSIS — Z8673 Personal history of transient ischemic attack (TIA), and cerebral infarction without residual deficits: Secondary | ICD-10-CM | POA: Diagnosis not present

## 2019-01-24 ENCOUNTER — Other Ambulatory Visit: Payer: Self-pay | Admitting: Family Medicine

## 2019-02-06 ENCOUNTER — Other Ambulatory Visit: Payer: Self-pay | Admitting: Neurology

## 2019-02-10 ENCOUNTER — Telehealth: Payer: Self-pay

## 2019-02-10 NOTE — Telephone Encounter (Signed)
If patients husband call please schedule them with Janett Billow NP the earliest available. Pt was last seen 06/2017 and had an appt in JUly 2020 but it was cancel by the husband. We receive refill request for Keppra. Pt has to be seen once a year to continue refills. Pt needs to be schedule in January 2021 with Janett Billow NP.  Vm was left for patients husband to call back to schedule an appt. IF appt is schedule they can only be given a refill until the appt time.

## 2019-02-10 NOTE — Telephone Encounter (Signed)
Note   If patients husband call please schedule them with Janett Billow NP the earliest available. Pt was last seen 06/2017 and had an appt in JUly 2020 but it was cancel by the husband. We receive refill request for Keppra. Pt has to be seen once a year to continue refills. Pt needs to be schedule in January 2021 with Janett Billow NP.  Vm was left for patients husband to call back to schedule an appt. IF appt is schedule they can only be given a refill until the appt time.

## 2019-02-11 ENCOUNTER — Other Ambulatory Visit: Payer: Self-pay

## 2019-02-11 NOTE — Telephone Encounter (Signed)
Note   I called pts husband [name] that pt was last seen 06/2017 for seizure and stroke. I stated pt needs to be seen because refill was requested for keppra medication.The husband stated he had to cancel appt and we cancel one time. I schedule pt with Janett Billow NP for January 26 at 315 check in time at 245pm. I advise him one visitor, mask required, no kids. The husband verbalized understanding.

## 2019-02-11 NOTE — Telephone Encounter (Signed)
Note   I called pts husband [name] that pt was last seen 06/2017 for seizure and stroke. I stated pt needs to be seen because refill was requested for keppra medication.The husband stated he had to cancel appt and we cancel one time. I schedule pt with Janett Billow NP for January 26 at 315 check in time at 245pm. I advise him one visitor, mask required, no kids. The husband verbalized understanding.

## 2019-02-11 NOTE — Telephone Encounter (Signed)
I called pts husband [name] that pt was last seen 06/2017 for seizure and stroke. I stated pt needs to be seen because refill was requested for keppra medication.The husband stated he had to cancel appt and we cancel one time. I schedule pt with Janett Billow NP for January 26 at 315 check in time at 245pm. I advise him one visitor, mask required, no kids. The husband verbalized understanding.

## 2019-02-17 ENCOUNTER — Telehealth: Payer: Self-pay | Admitting: Family Medicine

## 2019-02-17 NOTE — Telephone Encounter (Signed)
Pt's daughter called stating that patient has a history of UTI's and says patient has been complaining of having one again. Is requesting that we send medication to [facility] in [place].

## 2019-02-17 NOTE — Telephone Encounter (Signed)
Spoke with pt's daughter and advised she would ntbs for rx of antibiotics per office policy and scheduled her with Colvin Caroli for televisit on 12/30 at 10:05.

## 2019-02-18 ENCOUNTER — Other Ambulatory Visit: Payer: Self-pay | Admitting: Family Medicine

## 2019-02-19 ENCOUNTER — Encounter: Payer: Self-pay | Admitting: Family Medicine

## 2019-02-19 ENCOUNTER — Ambulatory Visit (INDEPENDENT_AMBULATORY_CARE_PROVIDER_SITE_OTHER): Payer: Medicare Other | Admitting: Family Medicine

## 2019-02-19 DIAGNOSIS — R3989 Other symptoms and signs involving the genitourinary system: Secondary | ICD-10-CM | POA: Diagnosis not present

## 2019-02-19 MED ORDER — CIPROFLOXACIN HCL 250 MG PO TABS: 250.0000 mg | ORAL_TABLET | Freq: Two times a day (BID) | ORAL | 0 refills | Status: AC

## 2019-02-19 NOTE — Progress Notes (Signed)
Virtual Visit via Telephone Note  I connected with [name] on 02/19/19 at 9:22 AM by telephone and verified that I am speaking with the correct person using two identifiers. [name] is currently located at home and her husband is currently with her during this visit. The provider, Loman Brooklyn, FNP is located in their office at time of visit.  I discussed the limitations, risks, security and privacy concerns of performing an evaluation and management service by telephone and the availability of in person appointments. I also discussed with the patient that there may be a patient responsible charge related to this service. The patient expressed understanding and agreed to proceed.  Subjective: PCP: Dettinger, Fransisca Kaufmann, MD  Chief Complaint  Patient presents with  . Urinary Tract Infection   Urinary Tract Infection: Patient complains of dysuria, foul smelling urine, frequency and urgency She has had symptoms for 3 days. Patient denies fever. Patient does have a history of recurrent UTI.  Patient does not have a history of pyelonephritis.    ROS: Per HPI  Current Outpatient Medications:  .  acetaminophen (TYLENOL) 500 MG tablet, Take 500 mg by mouth every 6 (six) hours as needed (pain). , Disp: , Rfl:  .  Ascorbic Acid (VITAMIN C PO), Take 1 tablet by mouth daily., Disp: , Rfl:  .  Calcium Carbonate-Vitamin D (CALCIUM-VITAMIN D) 500-200 MG-UNIT per tablet, Take 1 tablet by mouth daily., Disp: , Rfl:  .  cholecalciferol (VITAMIN D) 1000 UNITS tablet, Take 2,000 Units by mouth daily. , Disp: , Rfl:  .  citalopram (CELEXA) 20 MG tablet, Take 1 tablet (20 mg total) by mouth 2 (two) times daily., Disp: 180 tablet, Rfl: 3 .  cyclobenzaprine (FLEXERIL) 5 MG tablet, Take 1 tablet (5 mg total) by mouth 3 (three) times daily as needed for muscle spasms., Disp: 30 tablet, Rfl: 0 .  dicyclomine (BENTYL) 10 MG capsule, TAKE 1 CAPSULE BY MOUTH FOUR TIMES DAILY, BEFORE MEALS AND AT BEDTIME,  Disp: 360 capsule, Rfl: 0 .  fluticasone (FLONASE) 50 MCG/ACT nasal spray, SHAKE LIQUID AND USE 2 SPRAYS IN EACH NOSTRIL DAILY, Disp: 48 g, Rfl: 1 .  folic acid (FOLVITE) 1 MG tablet, TAKE 1 TABLET(1 MG) BY MOUTH DAILY (Patient taking differently: 2 mg. ), Disp: 90 tablet, Rfl: 0 .  furosemide (LASIX) 20 MG tablet, Take 1 tablet (20 mg total) by mouth daily., Disp: 90 tablet, Rfl: 3 .  glimepiride (AMARYL) 2 MG tablet, TAKE 1 TABLET(2 MG) BY MOUTH DAILY BEFORE BREAKFAST, Disp: 90 tablet, Rfl: 0 .  glucose blood test strip, TEST SUGAR DAILY, Disp: 100 each, Rfl: 3 .  levETIRAcetam (KEPPRA) 500 MG tablet, TAKE 1 TABLET(500 MG) BY MOUTH TWICE DAILY, Disp: 180 tablet, Rfl: 0 .  levothyroxine (SYNTHROID) 88 MCG tablet, TAKE 1 TABLET BY MOUTH EVERY DAY BEFORE BREAKFAST, Disp: 90 tablet, Rfl: 0 .  loperamide (IMODIUM) 2 MG capsule, Take by mouth as needed for diarrhea or loose stools (Take as directed). Reported on 05/17/2015, Disp: , Rfl:  .  metFORMIN (GLUCOPHAGE-XR) 500 MG 24 hr tablet, Take 2 tablets (1,000 mg total) by mouth 2 (two) times daily after a meal., Disp: 180 tablet, Rfl: 1 .  methotrexate (RHEUMATREX) 2.5 MG tablet, Take 4 tablets (10 mg total) by mouth once a week. Caution:Chemotherapy. Protect from light., Disp: 16 tablet, Rfl: 1 .  metoprolol tartrate (LOPRESSOR) 50 MG tablet, Take 1 tablet (50 mg total) by mouth 2 (two) times daily., Disp:  180 tablet, Rfl: 3 .  mirtazapine (REMERON) 15 MG tablet, Take 1 tablet (15 mg total) by mouth at bedtime., Disp: 90 tablet, Rfl: 3 .  Multiple Vitamins-Minerals (EYE VITAMINS PO), Take 2 tablets by mouth daily. AREDS formula, Disp: , Rfl:  .  NYSTATIN powder, APPLY TWICE DAILY UNDERNEATH BREASTS, Disp: 60 g, Rfl: 0 .  omeprazole (PRILOSEC) 40 MG capsule, TAKE 1 CAPSULE BY MOUTH EVERY DAY, Disp: 90 capsule, Rfl: 0 .  potassium chloride SA (KLOR-CON) 20 MEQ tablet, TAKE 2 TABLETS BY MOUTH TWICE DAILY, Disp: 360 tablet, Rfl: 0 .  Probiotic Product  (PROBIOTIC PO), Take 1 capsule by mouth daily., Disp: , Rfl:  .  rivaroxaban (XARELTO) 20 MG TABS tablet, Take 1 tablet (20 mg total) by mouth daily., Disp: 90 tablet, Rfl: 3 .  simvastatin (ZOCOR) 40 MG tablet, Take 1 tablet (40 mg total) by mouth daily., Disp: 90 tablet, Rfl: 1 .  traMADol (ULTRAM) 50 MG tablet, Take 1 tablet (50 mg total) by mouth every 6 (six) hours as needed. Take 1/2 tab by mouth four times daily as needed for severe pain, Disp: 90 tablet, Rfl: 1  Allergies  Allergen Reactions  . Latex Rash  . Caudal Tray [Lidocaine-Epinephrine]     Edema    . Codeine Nausea Only  . Diovan [Valsartan] Itching  . Doxycycline Other (See Comments)    Might have caused heart to race per pt - not sure  . Esomeprazole Magnesium     Headache    . Levofloxacin     Insomnia    . Myrbetriq [Mirabegron] Diarrhea  . Neomycin-Bacitracin Zn-Polymyx     rash  . Penicillins     hives  . Sulfa Drugs Cross Reactors Other (See Comments)    Pt not sure what reaction was  . Verapamil     Edema   . Vimovo [Naproxen-Esomeprazole]     Upset stomach    Past Medical History:  Diagnosis Date  . Anemia   . Bronchial spasms   . Chronic anxiety   . Chronic kidney disease   . Complication of anesthesia    " I shake real bad "  . Diverticulosis   . Elevated blood sugar   . Esophageal stricture   . Family history of anesthesia complication    Daughter also shakes while waking up  . GERD (gastroesophageal reflux disease)   . H/O hiatal hernia   . H/O scarlet fever   . History of bladder repair surgery   . Hx of vaginal hysterectomy   . Hypertension   . Hypothyroidism   . IBS (irritable bowel syndrome)    vs diarrhea vs abd. fullness   . Impaired glucose tolerance   . Neuromuscular disorder ([place])    periferal neuropathy  . Pacemaker    St. Jude  . Paroxysmal atrial fibrillation (HCC)    a. failed flecainide, tikosyn, amio;  b. 01/2012 s/p RFCA.  . Psoriasis   . Psoriasis   .  Rectocele, female   . Seizures ([place])   . Stroke ([place])   . Tachy-brady syndrome ([place])    a. 08/19/2008 s/p PPM: SJM 2110 Accent  . Uterine prolapse   . Vaginal prolapse 1998    Observations/Objective: A&O  No respiratory distress or wheezing audible over the phone Mood, judgement, and thought processes all WNL  Assessment and Plan: 1. Suspected UTI - Education provided on UTIs. Encouraged adequate hydration. Patient's husband reports she tolerates Cipro with her allergy to Levaquin.  -  ciprofloxacin (CIPRO) 250 MG tablet; Take 1 tablet (250 mg total) by mouth 2 (two) times daily for 7 days.  Dispense: 14 tablet; Refill: 0   Follow Up Instructions:  I discussed the assessment and treatment plan with the patient. The patient was provided an opportunity to ask questions and all were answered. The patient agreed with the plan and demonstrated an understanding of the instructions.   The patient was advised to call back or seek an in-person evaluation if the symptoms worsen or if the condition fails to improve as anticipated.  The above assessment and management plan was discussed with the patient. The patient verbalized understanding of and has agreed to the management plan. Patient is aware to call the clinic if symptoms persist or worsen. Patient is aware when to return to the clinic for a follow-up visit. Patient educated on when it is appropriate to go to the emergency department.   Time call ended: 9:34 AM  I provided 15 minutes of non-face-to-face time during this encounter.  Hendricks Limes, MSN, APRN, FNP-C [facility] 02/19/19

## 2019-02-19 NOTE — Patient Instructions (Signed)
Urinary Tract Infection, Adult A urinary tract infection (UTI) is an infection of any part of the urinary tract. The urinary tract includes:  The kidneys.  The ureters.  The bladder.  The urethra. These organs make, store, and get rid of pee (urine) in the body. What are the causes? This is caused by germs (bacteria) in your genital area. These germs grow and cause swelling (inflammation) of your urinary tract. What increases the risk? You are more likely to develop this condition if:  You have a small, thin tube (catheter) to drain pee.  You cannot control when you pee or poop (incontinence).  You are female, and: ? You use these methods to prevent pregnancy: ? A medicine that kills sperm (spermicide). ? A device that blocks sperm (diaphragm). ? You have low levels of a female hormone (estrogen). ? You are pregnant.  You have genes that add to your risk.  You are sexually active.  You take antibiotic medicines.  You have trouble peeing because of: ? A prostate that is bigger than normal, if you are female. ? A blockage in the part of your body that drains pee from the bladder (urethra). ? A kidney stone. ? A nerve condition that affects your bladder (neurogenic bladder). ? Not getting enough to drink. ? Not peeing often enough.  You have other conditions, such as: ? Diabetes. ? A weak disease-fighting system (immune system). ? Sickle cell disease. ? Gout. ? Injury of the spine. What are the signs or symptoms? Symptoms of this condition include:  Needing to pee right away (urgently).  Peeing often.  Peeing small amounts often.  Pain or burning when peeing.  Blood in the pee.  Pee that smells bad or not like normal.  Trouble peeing.  Pee that is cloudy.  Fluid coming from the vagina, if you are female.  Pain in the belly or lower back. Other symptoms include:  Throwing up (vomiting).  No urge to eat.  Feeling mixed up (confused).  Being tired  and grouchy (irritable).  A fever.  Watery poop (diarrhea). How is this treated? This condition may be treated with:  Antibiotic medicine.  Other medicines.  Drinking enough water. Follow these instructions at home:  Medicines  Take over-the-counter and prescription medicines only as told by your doctor.  If you were prescribed an antibiotic medicine, take it as told by your doctor. Do not stop taking it even if you start to feel better. General instructions  Make sure you: ? Pee until your bladder is empty. ? Do not hold pee for a long time. ? Empty your bladder after sex. ? Wipe from front to back after pooping if you are a female. Use each tissue one time when you wipe.  Drink enough fluid to keep your pee pale yellow.  Keep all follow-up visits as told by your doctor. This is important. Contact a doctor if:  You do not get better after 1-2 days.  Your symptoms go away and then come back. Get help right away if:  You have very bad back pain.  You have very bad pain in your lower belly.  You have a fever.  You are sick to your stomach (nauseous).  You are throwing up. Summary  A urinary tract infection (UTI) is an infection of any part of the urinary tract.  This condition is caused by germs in your genital area.  There are many risk factors for a UTI. These include having a small, thin   tube to drain pee and not being able to control when you pee or poop.  Treatment includes antibiotic medicines for germs.  Drink enough fluid to keep your pee pale yellow. This information is not intended to replace advice given to you by your health care provider. Make sure you discuss any questions you have with your health care provider. Document Released: 07/26/2007 Document Revised: 01/24/2018 Document Reviewed: 08/16/2017 Elsevier Patient Education  2020 Else[employer] Inc.  

## 2019-02-21 DIAGNOSIS — K766 Portal hypertension: Secondary | ICD-10-CM

## 2019-02-21 HISTORY — DX: Portal hypertension: K76.6

## 2019-02-27 ENCOUNTER — Other Ambulatory Visit: Payer: Self-pay | Admitting: Family Medicine

## 2019-02-27 MED ORDER — POTASSIUM CHLORIDE CRYS ER 20 MEQ PO TBCR: 40.0000 meq | EXTENDED_RELEASE_TABLET | Freq: Two times a day (BID) | ORAL | 1 refills | Status: DC

## 2019-02-27 NOTE — Telephone Encounter (Signed)
Spoke with Germain Osgood from [facility] in [place] who says patients caregiver has been in twice to get Rx's for patient (Potassium Chloride) and [facility] does not [facility] it. Says they have tried sending over refill requests to [place] several times. Needs refill sent to them for this Rx before they can refill.

## 2019-02-28 ENCOUNTER — Other Ambulatory Visit: Payer: Self-pay

## 2019-03-03 ENCOUNTER — Encounter: Payer: Self-pay | Admitting: Family Medicine

## 2019-03-03 ENCOUNTER — Other Ambulatory Visit: Payer: Self-pay

## 2019-03-03 ENCOUNTER — Ambulatory Visit (INDEPENDENT_AMBULATORY_CARE_PROVIDER_SITE_OTHER): Payer: Medicare Other | Admitting: Family Medicine

## 2019-03-03 VITALS — BP 138/65 | HR 71 | Temp 96.6°F

## 2019-03-03 DIAGNOSIS — E1159 Type 2 diabetes mellitus with other circulatory complications: Secondary | ICD-10-CM | POA: Diagnosis not present

## 2019-03-03 DIAGNOSIS — I10 Essential (primary) hypertension: Secondary | ICD-10-CM | POA: Diagnosis not present

## 2019-03-03 NOTE — Progress Notes (Signed)
BP 138/65   Pulse 71   Temp (!) 96.6 F (35.9 C) (Temporal)   SpO2 100%    Subjective:   Patient ID: [name], female    DOB: 08/27/1945, 74 y.o.   MRN: [id number]  HPI: LASHE COYE is a 74 y.o. female presenting on 03/03/2019 for Diabetes (check up) and Hypertension   HPI Type 2 diabetes mellitus Patient comes in today for recheck of his diabetes. Patient has been currently taking Metformin 1000 twice a day and glimepiride 2 mg. Patient is not currently on an ACE inhibitor/ARB. Patient has not seen an ophthalmologist this year. Patient denies any issues with their feet.   Hypertension Patient is currently on metoprolol and furosemide, and their blood pressure today is 38/65. Patient denies any lightheadedness or dizziness. Patient denies headaches, blurred vision, chest pains, shortness of breath, or weakness. Denies any side effects from medication and is content with current medication.    Patient had a recent elbow surgery and is seeing orthopedic for this  Relevant past medical, surgical, family and social history reviewed and updated as indicated. Interim medical history since our last visit reviewed. Allergies and medications reviewed and updated.  Review of Systems  Constitutional: Negative for chills and fever.  Eyes: Negative for visual disturbance.  Respiratory: Negative for chest tightness and shortness of breath.   Cardiovascular: Negative for chest pain and leg swelling.  Musculoskeletal: Negative for back pain and gait problem.  Skin: Negative for rash.  Neurological: Negative for light-headedness and headaches.  Psychiatric/Behavioral: Negative for agitation and behavioral problems.  All other systems reviewed and are negative.   Per HPI unless specifically indicated above   Allergies as of 03/03/2019      Reactions   Latex Rash   Caudal Tray [lidocaine-epinephrine]    Edema    Codeine Nausea Only   Diovan [valsartan] Itching   Doxycycline Other  (See Comments)   Might have caused heart to race per pt - not sure   Esomeprazole Magnesium    Headache    Levofloxacin    Insomnia    Myrbetriq [mirabegron] Diarrhea   Neomycin-bacitracin Zn-polymyx    rash   Penicillins    hives   Sulfa Drugs Cross Reactors Other (See Comments)   Pt not sure what reaction was   Verapamil    Edema   Vimovo [naproxen-esomeprazole]    Upset stomach      Medication List       Accurate as of March 03, 2019  4:03 PM. If you have any questions, ask your nurse or doctor.        acetaminophen 500 MG tablet Commonly known as: TYLENOL Take 500 mg by mouth every 6 (six) hours as needed (pain).   calcium-vitamin D 500-200 MG-UNIT tablet Take 1 tablet by mouth daily.   cholecalciferol 1000 units tablet Commonly known as: VITAMIN D Take 2,000 Units by mouth daily.   citalopram 20 MG tablet Commonly known as: CELEXA Take 1 tablet (20 mg total) by mouth 2 (two) times daily.   cyclobenzaprine 5 MG tablet Commonly known as: FLEXERIL Take 1 tablet (5 mg total) by mouth 3 (three) times daily as needed for muscle spasms.   dicyclomine 10 MG capsule Commonly known as: BENTYL TAKE 1 CAPSULE BY MOUTH FOUR TIMES DAILY, BEFORE MEALS AND AT BEDTIME   EYE VITAMINS PO Take 2 tablets by mouth daily. AREDS formula   fluticasone 50 MCG/ACT nasal spray Commonly known as: FLONASE SHAKE LIQUID  AND USE 2 SPRAYS IN EACH NOSTRIL DAILY   folic acid 1 MG tablet Commonly known as: FOLVITE TAKE 1 TABLET(1 MG) BY MOUTH DAILY What changed: See the new instructions.   furosemide 20 MG tablet Commonly known as: LASIX Take 1 tablet (20 mg total) by mouth daily.   glimepiride 2 MG tablet Commonly known as: AMARYL TAKE 1 TABLET(2 MG) BY MOUTH DAILY BEFORE BREAKFAST   glucose blood test strip TEST SUGAR DAILY   levETIRAcetam 500 MG tablet Commonly known as: KEPPRA TAKE 1 TABLET(500 MG) BY MOUTH TWICE DAILY   levothyroxine 88 MCG tablet Commonly known  as: SYNTHROID TAKE 1 TABLET BY MOUTH EVERY DAY BEFORE BREAKFAST   loperamide 2 MG capsule Commonly known as: IMODIUM Take by mouth as needed for diarrhea or loose stools (Take as directed). Reported on 05/17/2015   metFORMIN 500 MG 24 hr tablet Commonly known as: GLUCOPHAGE-XR Take 2 tablets (1,000 mg total) by mouth 2 (two) times daily after a meal.   methotrexate 2.5 MG tablet Commonly known as: RHEUMATREX Take 4 tablets (10 mg total) by mouth once a week. Caution:Chemotherapy. Protect from light.   metoprolol tartrate 50 MG tablet Commonly known as: LOPRESSOR Take 1 tablet (50 mg total) by mouth 2 (two) times daily.   mirtazapine 15 MG tablet Commonly known as: REMERON Take 1 tablet (15 mg total) by mouth at bedtime.   nystatin powder Generic drug: nystatin APPLY TWICE DAILY UNDERNEATH BREASTS   omeprazole 40 MG capsule Commonly known as: PRILOSEC TAKE 1 CAPSULE BY MOUTH EVERY DAY   potassium chloride SA 20 MEQ tablet Commonly known as: KLOR-CON Take 2 tablets (40 mEq total) by mouth 2 (two) times daily.   PROBIOTIC PO Take 1 capsule by mouth daily.   rivaroxaban 20 MG Tabs tablet Commonly known as: Xarelto Take 1 tablet (20 mg total) by mouth daily.   simvastatin 40 MG tablet Commonly known as: ZOCOR Take 1 tablet (40 mg total) by mouth daily.   traMADol 50 MG tablet Commonly known as: ULTRAM Take 1 tablet (50 mg total) by mouth every 6 (six) hours as needed. Take 1/2 tab by mouth four times daily as needed for severe pain   VITAMIN C PO Take 1 tablet by mouth daily.        Objective:   BP 138/65   Pulse 71   Temp (!) 96.6 F (35.9 C) (Temporal)   SpO2 100%   Wt Readings from Last 3 Encounters:  08/19/18 175 lb (79.4 kg)  02/27/18 169 lb (76.7 kg)  10/17/17 177 lb (80.3 kg)    Physical Exam Vitals and nursing note reviewed.  Constitutional:      General: She is not in acute distress.    Appearance: She is well-developed. She is not  diaphoretic.  Eyes:     Conjunctiva/sclera: Conjunctivae normal.  Cardiovascular:     Rate and Rhythm: Normal rate and regular rhythm.     Heart sounds: Normal heart sounds. No murmur.  Pulmonary:     Effort: Pulmonary effort is normal. No respiratory distress.     Breath sounds: Normal breath sounds. No wheezing.  Musculoskeletal:     Comments: Right elbow in a splint and a sling  Skin:    General: Skin is warm and dry.     Findings: No rash.  Neurological:     Mental Status: She is alert and oriented to person, place, and time.     Coordination: Coordination normal.  Psychiatric:  Behavior: Behavior normal.       Assessment & Plan:   Problem List Items Addressed This Visit      Cardiovascular and Mediastinum   HTN (hypertension)     Endocrine   Type 2 diabetes mellitus ([place]) - Primary      Gave sample for Trulicity because blood sugars are still running up in the high 100s and low 200s, no A1c today because is too early, if the Trulicity goes well over this month then we will continue and write a prescription for it.  Trulicity 1.5 mg Follow up plan: Return if symptoms worsen or fail to improve, for Return in 2 to 3 months for diabetes recheck.  Counseling provided for all of the vaccine components No orders of the defined types were placed in this encounter.   Caryl Pina, MD [facility] 03/03/2019, 4:03 PM

## 2019-03-04 ENCOUNTER — Telehealth: Payer: Self-pay | Admitting: Family Medicine

## 2019-03-04 IMAGING — DX DG CHEST 2V
2 series · 2 of 2 positions shown · non-contrast
Comparison: 07/14/2015

CLINICAL DATA: Hypertension.

EXAM:
CHEST - 2 VIEW

[chest pa]
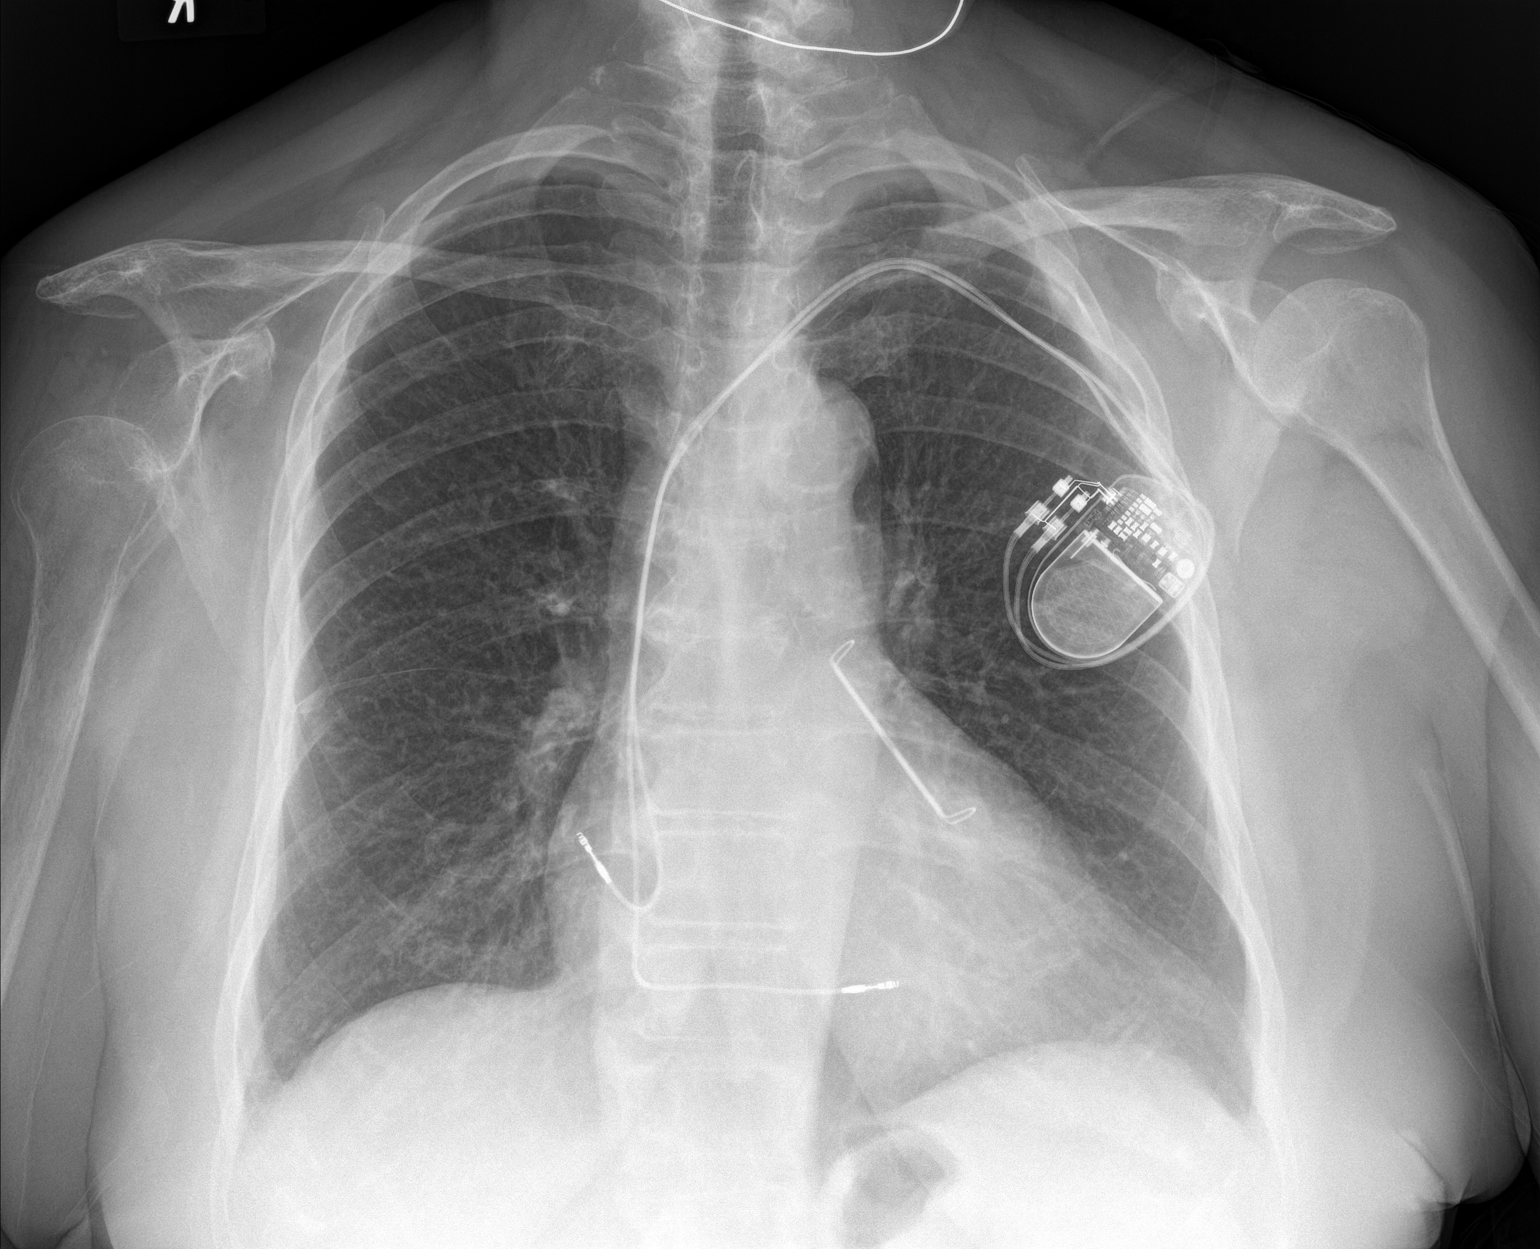

[chest lat]
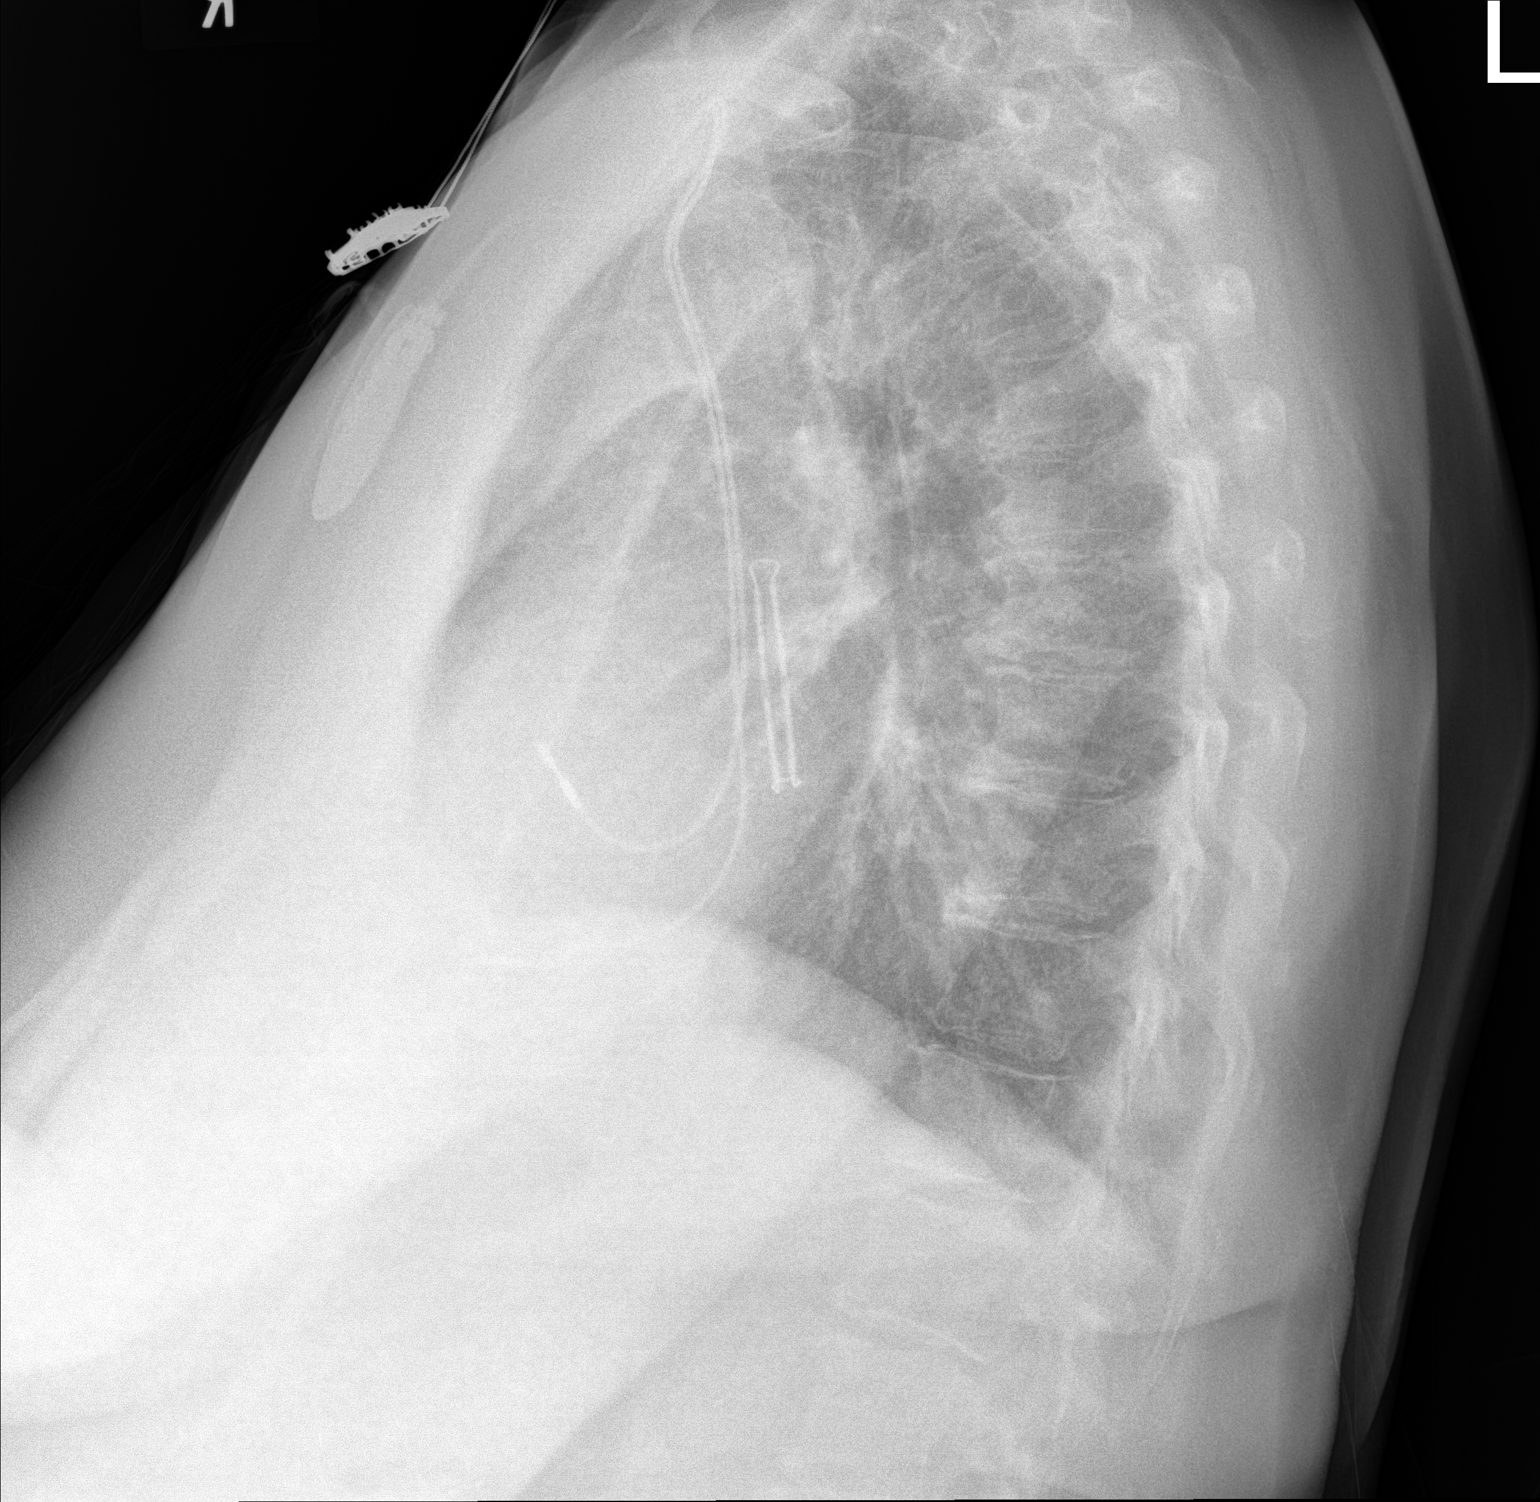

[2 of 2 positions shown; findings below may reference images not displayed]

FINDINGS: Lateral view degraded by patient arm position. Pacer with leads at
right atrium and right ventricle. No lead discontinuity. Left atrial
appendage occlusion device. Midline trachea. Mild cardiomegaly.
Atherosclerosis in the transverse aorta. No pleural effusion or
pneumothorax. Mild biapical pleural thickening. No congestive
failure. Mild thickening of the right minor fissure laterally.
IMPRESSION: No acute cardiopulmonary disease.

Cardiomegaly without congestive failure.

Aortic Atherosclerosis ([id number]).

## 2019-03-04 NOTE — Telephone Encounter (Signed)
Updated pharmacy in chart.

## 2019-03-12 ENCOUNTER — Telehealth: Payer: Self-pay | Admitting: Family Medicine

## 2019-03-12 NOTE — Telephone Encounter (Signed)
Spoke to pt's husband and he states the pt is having some nausea this morning after taking her first dose of Trulicity on Sunday. Advised the most common side effects of Trulicity are N/V diarrhea, cramping etc in the first 2 weeks but usually subsides after that. Advised to try and eat bland foods and monitor. If nausea gets too bad to call us back and let us know. Pt's husband voiced understanding.

## 2019-03-13 ENCOUNTER — Ambulatory Visit (INDEPENDENT_AMBULATORY_CARE_PROVIDER_SITE_OTHER): Payer: Medicare Other | Admitting: *Deleted

## 2019-03-13 DIAGNOSIS — I480 Paroxysmal atrial fibrillation: Secondary | ICD-10-CM | POA: Diagnosis not present

## 2019-03-14 ENCOUNTER — Telehealth: Payer: Self-pay

## 2019-03-14 NOTE — Telephone Encounter (Signed)
Left message for patient to remind of missed remote transmission.  

## 2019-03-17 LAB — CUP PACEART REMOTE DEVICE CHECK
Battery Remaining Longevity: 50 mo
Battery Remaining Percentage: 40 %
Battery Voltage: 2.84 V
Brady Statistic AP VP Percent: 1 %
Brady Statistic AP VS Percent: 1 %
Brady Statistic AS VP Percent: 1 %
Brady Statistic AS VS Percent: 99 %
Brady Statistic RA Percent Paced: 1 %
Brady Statistic RV Percent Paced: 1 %
Date Time Interrogation Session: 20210125100231
Implantable Lead Implant Date: 20100630
Implantable Lead Implant Date: 20100630
Implantable Lead Location: 753859
Implantable Lead Location: 753860
Implantable Pulse Generator Implant Date: 20100630
Lead Channel Impedance Value: 340 Ohm
Lead Channel Impedance Value: 450 Ohm
Lead Channel Pacing Threshold Amplitude: 0.75 V
Lead Channel Pacing Threshold Amplitude: 1.25 V
Lead Channel Pacing Threshold Pulse Width: 0.4 ms
Lead Channel Pacing Threshold Pulse Width: 0.8 ms
Lead Channel Sensing Intrinsic Amplitude: 4.3 mV
Lead Channel Sensing Intrinsic Amplitude: 5.4 mV
Lead Channel Setting Pacing Amplitude: 2 V
Lead Channel Setting Pacing Amplitude: 2.5 V
Lead Channel Setting Pacing Pulse Width: 0.8 ms
Lead Channel Setting Sensing Sensitivity: 1.5 mV
Pulse Gen Model: 2110
Pulse Gen Serial Number: 2285953

## 2019-03-18 ENCOUNTER — Ambulatory Visit (INDEPENDENT_AMBULATORY_CARE_PROVIDER_SITE_OTHER): Payer: Medicare Other | Admitting: Adult Health

## 2019-03-18 ENCOUNTER — Other Ambulatory Visit: Payer: Self-pay

## 2019-03-18 ENCOUNTER — Encounter: Payer: Self-pay | Admitting: Adult Health

## 2019-03-18 VITALS — BP 159/84 | HR 89 | Temp 97.1°F | Ht 64.0 in | Wt 176.0 lb

## 2019-03-18 DIAGNOSIS — E785 Hyperlipidemia, unspecified: Secondary | ICD-10-CM

## 2019-03-18 DIAGNOSIS — R569 Unspecified convulsions: Secondary | ICD-10-CM

## 2019-03-18 DIAGNOSIS — I480 Paroxysmal atrial fibrillation: Secondary | ICD-10-CM

## 2019-03-18 DIAGNOSIS — E119 Type 2 diabetes mellitus without complications: Secondary | ICD-10-CM

## 2019-03-18 DIAGNOSIS — I69398 Other sequelae of cerebral infarction: Secondary | ICD-10-CM

## 2019-03-18 DIAGNOSIS — Z8673 Personal history of transient ischemic attack (TIA), and cerebral infarction without residual deficits: Secondary | ICD-10-CM

## 2019-03-18 DIAGNOSIS — I10 Essential (primary) hypertension: Secondary | ICD-10-CM

## 2019-03-18 NOTE — Progress Notes (Addendum)
[facility]    Provider:  Dr Jaynee Eagles Referring Provider: Dettinger, Fransisca Kaufmann, MD Primary Care Physician:  Dettinger, Fransisca Kaufmann, MD  CC: Stroke and seizure follow up   HPI: [name] is a 74 year old female with PMH of HTN, HLD, DM, PAF, tachy brady syndrome, s/p permanent pacemaker insertion, and history of stroke (2015). [name] suffered from stroke during atrial fibrillation ablation. [name] had residual right sided weakness and aphasia.  During rehab post stroke, she was diagnosed with seizures. Seizures confirmed on EEG and [name] placed on Keppra. Problems with memory loss and depression.   Update 03/18/2019: [name] is a 74 year old female who is being seen today for stroke and seizure follow-up accompanied by her husband as well as discussion with daughter via phone. She has not been seen in over a year due to canceled visits and COVID-19 safety concerns.  Residual stroke deficits include expressive aphasia, right hemiparesis and cognitive impairment.  Over the past few months, she has had increase in falls and unfortunately led to right elbow fracture. She was evaluated by PCP and felt worsening right hemiparesis and speech likely due to overall deconditioning. She has been participating in outpatient PT and OT and will be shortly initiating ST. Husband also endorses occasional episodes of increased confusion and lethargy but this is usually when she has a UTI. She has been making progress with use of therapy but currently using wheelchair for long distance. She is able to walk short distance with use of assistive device and has not had any recent falls. Her husband continues to care for her full-time but has been having greater difficulty with this. If she is not able to make continued progress with therapy, they are considering SNF placement.  Denies any recent seizure activity and continues on Keppra 500 mg twice daily.  Daughter and husband question whether episode  of increased confusion could be seizure occurrences and questions need of repeat EEG or repeat imaging.  She has continued on Xarelto and simvastatin for secondary stroke prevention without side effects.  Blood pressure today slightly elevated but has been stable at home along with stable glucose levels.  Continues to follow with PCP for HTN, HLD and DM management.  No further concerns at this time.  07/19/17 update JM: [name] returns today for one-year follow-up and is accompanied by her husband.  [name] continues to have expressive aphasia but per her husband this is improving.  She continues to have right-sided hemiparesis.  Per husband she has had a few episodes of staring out where she has a blank stare and does not respond.  This is only happened recently and approximately 3 times.  Denies any other seizure like activity.  Continues to take Keppra 500 mg twice daily.  [name] currently has large ventral hernia that needs to be repaired but [name] continues to push off surgery due to fear of not fixing it and making it worse.  [name] is currently being treated for UTI and will be on Keflex, per husband, until July and at that time they will revisit possible surgical procedure.  Continues to take Xarelto with some bruising but no bleeding.  Continues to take Zocor with out side effects of myalgias.  Husband seems to be concerned about [name] not having desire to leave house or to be active as he is is eager to leave house but is unable to leave her.  Depression continues but has not worsened since last visit and [name] is compliant with  Celexa.  Husband seems distraught by this situation of being a caregiver for his wife.  As macular degeneration is worsening, [name] has increased blurriness compared to last visit.  Recommended psychiatry or therapist counseling for her husband as well as for wife but both of them are refusing at this time.  [name] continues to be compliant on all other medications and  denies new or worsening stroke/TIA symptoms.  07/19/2016 visit AA: She has macular degeneration and vision changes since the stroke and they saw a physician and tried glasses. In the last year vision is worsening. She can't see as well. Last year she couldn't pass the eye exam. Her glasses did not help. She sees blurry all the time not worse in the morning or worse at night, slowly worsening over the last year, no pain on eye movement, continuous. No ptosis. No other new weakness. Just blurry vision. If she closes one eye the It is the left eye that is blurry and worsening. Her macular degeneration is worsening. The blurriness does not improve with closing one eye. Recommend an MRi of the braina nd further workup but they want to see ophthalmology first and let me know how they want to proceed. No seizures. She continues to be depressed and less social, but she denies significant changes in mood from last year. She had depression even beofre the stroke, she is a home body and husband wants to get out. She declines any more PT. Her speech is improving.    Review of Systems: [name] complains of symptoms per HPI as well as the following symptoms: Visual impairment, fatigue, memory loss, confusion, depression/anxiety, weakness and speech difficulty.  Pertinent negatives per HPI. All others negative..  Social History   Socioeconomic History  . Marital status: Married    Spouse name: [name]"  . Number of children: 2  . Years of education: 14 yrs  . Highest education level: Associate degree: academic program  Occupational History  . 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  Tobacco Use  . Smoking status: Never Smoker  . Smokeless tobacco: Never Used  Substance and Sexual Activity  . Alcohol use: No  . Drug use: No  . Sexual activity: Yes  Other Topics Concern  . Not on file  Social History Narrative   Lives in [place].      Left-handed   Caffeine use: drinks  coffee/tea: 2 cups coffee in the morning, drinks 1 glass tea per day    Social Determinants of Health   Financial Resource Strain:   . Difficulty of Paying Living Expenses: Not on file  Food Insecurity:   . Worried About Charity fundraiser in the Last Year: Not on file  . Ran Out of Food in the Last Year: Not on file  Transportation Needs:   . Lack of Transportation (Medical): Not on file  . Lack of Transportation (Non-Medical): Not on file  Physical Activity:   . Days of Exercise per Week: Not on file  . Minutes of Exercise per Session: Not on file  Stress:   . Feeling of Stress : Not on file  Social Connections:   . Frequency of Communication with Friends and Family: Not on file  . Frequency of Social Gatherings with Friends and Family: Not on file  . Attends Religious Services: Not on file  . Active Member of Clubs or Organizations: Not on file  . Attends Archivist Meetings: Not on file  . Marital Status:  Not on file  Intimate Partner Violence:   . Fear of Current or Ex-Partner: Not on file  . Emotionally Abused: Not on file  . Physically Abused: Not on file  . Sexually Abused: Not on file    Family History  Problem Relation Age of Onset  . Diabetes Mother   . Heart disease Mother   . Heart failure Mother   . Macular degeneration Mother   . Heart disease Father   . Breast cancer Sister   . Ovarian cancer Sister   . Uterine cancer Paternal Aunt   . Crohn's disease Other        neice  . Diabetes Maternal Grandmother   . Diabetes Sister   . Heart disease Brother   . Heart disease Brother   . Macular degeneration Brother   . Colon cancer Neg Hx   . Stomach cancer Neg Hx     Past Medical History:  Diagnosis Date  . Anemia   . Bronchial spasms   . Chronic anxiety   . Chronic kidney disease   . Complication of anesthesia    " I shake real bad "  . Diverticulosis   . Elevated blood sugar   . Esophageal stricture   . Family history of anesthesia  complication    Daughter also shakes while waking up  . GERD (gastroesophageal reflux disease)   . H/O hiatal hernia   . H/O scarlet fever   . History of bladder repair surgery   . Hx of vaginal hysterectomy   . Hypertension   . Hypothyroidism   . IBS (irritable bowel syndrome)    vs diarrhea vs abd. fullness   . Impaired glucose tolerance   . Neuromuscular disorder ([place])    periferal neuropathy  . Pacemaker    St. Jude  . Paroxysmal atrial fibrillation (HCC)    a. failed flecainide, tikosyn, amio;  b. 01/2012 s/p RFCA.  . Psoriasis   . Psoriasis   . Rectocele, female   . Seizures ([place])   . Stroke ([place])   . Tachy-brady syndrome ([place])    a. 08/19/2008 s/p PPM: SJM 2110 [address]  . Uterine prolapse   . Vaginal prolapse 1998    Past Surgical History:  Procedure Laterality Date  . ATRIAL FIBRILLATION ABLATION  02/06/12   PVI by Dr Rayann Heman  . ATRIAL FIBRILLATION ABLATION  07/04/2012   repeat PVI by Dr Rayann Heman  . ATRIAL FIBRILLATION ABLATION N/A 02/06/2012   Procedure: ATRIAL FIBRILLATION ABLATION;  Surgeon: Thompson Grayer, MD;  Location[facility] C[facility] LAB;  Service: Cardiovascular;  Laterality: N/A;  . ATRIAL FIBRILLATION ABLATION N/A 07/04/2012   Procedure: ATRIAL FIBRILLATION ABLATION;  Surgeon: Thompson Grayer, MD;  Location: [facility] CATH LAB;  Service: Cardiovascular;  Laterality: N/A;  . BLADDER SUSPENSION    . INSERT / REPLACE / REMOVE PACEMAKER     SJM  . LEFT HEART CATHETERIZATION WITH CORONARY ANGIOGRAM N/A 03/24/2013   Procedure: LEFT HEART CATHETERIZATION WITH CORONARY ANGIOGRAM;  Surgeon: Blane Ohara, MD;  Location: [facility] CATH LAB;  Service: Cardiovascular;  Laterality: N/A;  . RECTOCELE REPAIR    . TEE WITHOUT CARDIOVERSION  02/06/2012   Procedure: TRANSESOPHAGEAL ECHOCARDIOGRAM (TEE);  Surgeon: Thayer Headings, MD;  Location: [place];  Service: Cardiovascular;  Laterality: N/A;  . TEE WITHOUT CARDIOVERSION N/A 07/04/2012   Procedure: TRANSESOPHAGEAL ECHOCARDIOGRAM (TEE);   Surgeon: Lelon Perla, MD;  Location: [facility] ENDOSCOPY;  Service: Cardiovascular;  Laterality: N/A;  . TRANSTHORACIC ECHOCARDIOGRAM  2008  .  VAGINAL HYSTERECTOMY     prolapse     Current Outpatient Medications  Medication Sig Dispense Refill  . acetaminophen (TYLENOL) 500 MG tablet Take 500 mg by mouth every 6 (six) hours as needed (pain).     . Ascorbic Acid (VITAMIN C PO) Take 1 tablet by mouth daily.    . Calcium Carbonate-Vitamin D (CALCIUM-VITAMIN D) 500-200 MG-UNIT per tablet Take 1 tablet by mouth daily.    . cholecalciferol (VITAMIN D) 1000 UNITS tablet Take 2,000 Units by mouth daily.     . citalopram (CELEXA) 20 MG tablet Take 1 tablet (20 mg total) by mouth 2 (two) times daily. 180 tablet 3  . cyclobenzaprine (FLEXERIL) 5 MG tablet Take 1 tablet (5 mg total) by mouth 3 (three) times daily as needed for muscle spasms. 30 tablet 0  . dicyclomine (BENTYL) 10 MG capsule TAKE 1 CAPSULE BY MOUTH FOUR TIMES DAILY, BEFORE MEALS AND AT BEDTIME 360 capsule 0  . fluticasone (FLONASE) 50 MCG/ACT nasal spray SHAKE LIQUID AND USE 2 SPRAYS IN EACH NOSTRIL DAILY 48 g 1  . folic acid (FOLVITE) 1 MG tablet TAKE 1 TABLET(1 MG) BY MOUTH DAILY ([name] taking differently: 2 mg. ) 90 tablet 0  . furosemide (LASIX) 20 MG tablet Take 1 tablet (20 mg total) by mouth daily. 90 tablet 3  . glimepiride (AMARYL) 2 MG tablet TAKE 1 TABLET(2 MG) BY MOUTH DAILY BEFORE BREAKFAST 90 tablet 0  . glucose blood test strip TEST SUGAR DAILY 100 each 3  . levETIRAcetam (KEPPRA) 500 MG tablet TAKE 1 TABLET(500 MG) BY MOUTH TWICE DAILY 180 tablet 0  . levothyroxine (SYNTHROID) 88 MCG tablet TAKE 1 TABLET BY MOUTH EVERY DAY BEFORE BREAKFAST 90 tablet 0  . loperamide (IMODIUM) 2 MG capsule Take by mouth as needed for diarrhea or loose stools (Take as directed). Reported on 05/17/2015    . metFORMIN (GLUCOPHAGE-XR) 500 MG 24 hr tablet Take 2 tablets (1,000 mg total) by mouth 2 (two) times daily after a meal. 180 tablet 1  .  methotrexate (RHEUMATREX) 2.5 MG tablet Take 4 tablets (10 mg total) by mouth once a week. Caution:Chemotherapy. Protect from light. 16 tablet 1  . metoprolol tartrate (LOPRESSOR) 50 MG tablet Take 1 tablet (50 mg total) by mouth 2 (two) times daily. 180 tablet 3  . mirtazapine (REMERON) 15 MG tablet Take 1 tablet (15 mg total) by mouth at bedtime. 90 tablet 3  . Multiple Vitamins-Minerals (EYE VITAMINS PO) Take 2 tablets by mouth daily. AREDS formula    . NYSTATIN powder APPLY TWICE DAILY UNDERNEATH BREASTS 60 g 0  . omeprazole (PRILOSEC) 40 MG capsule TAKE 1 CAPSULE BY MOUTH EVERY DAY 90 capsule 0  . potassium chloride SA (KLOR-CON) 20 MEQ tablet Take 2 tablets (40 mEq total) by mouth 2 (two) times daily. 360 tablet 1  . Probiotic Product (PROBIOTIC PO) Take 1 capsule by mouth daily.    . rivaroxaban (XARELTO) 20 MG TABS tablet Take 1 tablet (20 mg total) by mouth daily. 90 tablet 3  . simvastatin (ZOCOR) 40 MG tablet Take 1 tablet (40 mg total) by mouth daily. 90 tablet 1  . traMADol (ULTRAM) 50 MG tablet Take 1 tablet (50 mg total) by mouth every 6 (six) hours as needed. Take 1/2 tab by mouth four times daily as needed for severe pain 90 tablet 1   No current facility-administered medications for this visit.    Allergies as of 03/18/2019 - Review Complete 03/03/2019  Allergen Reaction  Noted  . Latex Rash 06/27/2012  . Caudal tray [lidocaine-epinephrine]  06/22/2010  . Codeine Nausea Only   . Diovan [valsartan] Itching 06/22/2010  . Doxycycline Other (See Comments)   . Esomeprazole magnesium  06/22/2010  . Levofloxacin  06/22/2010  . Myrbetriq [mirabegron] Diarrhea 12/01/2013  . Neomycin-bacitracin zn-polymyx    . Penicillins    . Sulfa drugs cross reactors Other (See Comments) 06/22/2010  . Verapamil  06/22/2010  . Vimovo [naproxen-esomeprazole]  06/22/2010    Vitals: There were no vitals taken for this visit. Last Weight:  Wt Readings from Last 1 Encounters:  08/19/18 175 lb  (79.4 kg)   Last Height:   Ht Readings from Last 1 Encounters:  12/26/18 5\' 4"  (1.626 m)    Speech:  Mild to moderate expressive aphasia, comprehension appears to be intact Cognition:  The [name] is oriented to person, date, day, month, year.  Unable to assess further due to aphasia Cranial Nerves:  physiologic anisocoria left pupil 64mm right 37mm but both round, and equally reactive to light. Visual fields are full to finger confrontation. Extraocular movements are intact. Trigeminal sensation is intact and the muscles of mastication are normal. Right lower facial droop stable. The palate elevates in the midline. Hearing intact. Voice is normal. Shoulder shrug is normal. The tongue has normal motion without fasciculations.   Coordination:  No dysmetria on the left, cannot perform on the right due to weakness  Gait: Able to stand without assistance and takes a couple steps with hemiplegic gait and stiff and right leg.  Not further assessed due to not having assistive device during visit  Motor Observation:  No asymmetry, no atrophy, and no involuntary movements noted.  Tone:  Normal muscle tone.   Posture:  Posture is normal. normal erect   Strength:  RUE: 2/5 but limited exam due to recent elbow fracture RLE: 4/5 with slightly weaker dorsiflexion  Full strength left upper and lower extremity   Sensation: Decreased light touch sensation in right upper and lower extremity   Reflex Exam: 2+ bilaterally and symmetrically  Toes:  Right upgoing  Clonus:  Clonus is absent.   Assessment/Plan: 74 year old [name] who presents with her husband here for follow up of left MCA stroke during ablation for atrial fibrillation with residual right-sided hemiparesis, cognitive impairment and aphasia in 03/2013.  Also diagnosed with seizures at that time and placed on Keppra.  She has had ongoing difficulty with depression.  -Prolonged discussion with  husband and daughter regarding worsening prior stroke deficits can happen with illness, infection, increased stress and lack of physical activity.  Husband does admit to minimal amount of activity with sedentary lifestyle but weakness has been greatly improving with therapy.  He has noticed worsening of RUE but likely due to fracture.  Discussion regarding possibly repeating imaging to assess for new or worsening stroke but currently max medical therapy therefore current treatment plan would not change and she has not had any new onset of symptoms. -Advised to continue Keppra 500 mg twice daily for seizure prophylaxis.  Daughter questions whether dose issues to be increased or needs repeat EEG.  She has not had any reoccurring seizure activity but they question episodes of confusion.  These are felt to be more related to possible vascular cognitive impairment post stroke or possible frontotemporal dementia and less likely seizure activity.  Recommended to continue current dose of Keppra as no definitive indication to increase and this will only cause increased risk of side effects.  She  is also on multiple medications and episodes of confusion could also potentially be due to polypharmacy.  -Continue Xarelto and Zocor for secondary stroke prevention -Follow-up with PCP regarding HLD, HTN and DM -Continue to follow with cardiology for atrial fibrillation Eliquis management -Highly encouraged continuation with PT/OT and shortly initiating ST for hopeful ongoing improvement along with discussion regarding importance of doing exercises at home as recommended during therapy sessions -Maintain strict control of hypertension with blood pressure goal below 130/90, diabetes with hemoglobin A1c goal below 6.5% and cholesterol with LDL cholesterol (bad cholesterol) goal below 70 mg/dL. I also advised the [name] to eat a healthy diet with plenty of whole grains, cereals, fruits and vegetables, exercise regularly and  maintain ideal body weight.  Recommend follow-up in 3 months with Dr. Jaynee Eagles or call earlier if needed  Greater than 50% of this prolonged 60-minute visit was spent discussing prior stroke with residual deficits, recent slowly worsening prior stroke symptoms and possible etiology, importance of managing stroke risk factors including HTN, HLD, DM and atrial fibrillation, discussion regarding continuation of Keppra at current dose and answered all questions to [name], daughter and husbands satisfaction    Frann Rider, AGNP-BC  [facility] [address] [place], [place] [address]  Phone [phone] Fax [phone]   Made any corrections needed, and agree with history, physical, neuro exam,assessment and plan as stated.     Sarina Ill, MD [facility]

## 2019-03-18 NOTE — Patient Instructions (Signed)
Continue Xarelto (rivaroxaban) daily  and simvastatin for secondary stroke prevention  Continue Keppra 500 mg twice daily for seizure prophylaxis  Highly encourage continued participation with outpatient physical and occupational therapy as well as shortly starting speech therapy. Her worsening symptoms are likely due to deconditioning as well as recent right arm fracture  Continue to follow up with PCP regarding cholesterol, blood pressure and diabetes management   Continue to monitor blood pressure at home  Maintain strict control of hypertension with blood pressure goal below 130/90, diabetes with hemoglobin A1c goal below 6.5% and cholesterol with LDL cholesterol (bad cholesterol) goal below 70 mg/dL. I also advised the patient to eat a healthy diet with plenty of whole grains, cereals, fruits and vegetables, exercise regularly and maintain ideal body weight.         Thank you for coming to see [place] at [facility]. I hope we have been able to provide you high quality care today.  You may receive a patient satisfaction survey over the next few weeks. We would appreciate your feedback and comments so that we may continue to improve ourselves and the health of our patients.

## 2019-03-31 ENCOUNTER — Telehealth: Payer: Self-pay | Admitting: Emergency Medicine

## 2019-03-31 ENCOUNTER — Telehealth: Payer: Self-pay | Admitting: Internal Medicine

## 2019-03-31 NOTE — Telephone Encounter (Signed)
Confirmed address and will send DPR form to family to fill out with who they would like to be involved in receiving patient information.

## 2019-03-31 NOTE — Telephone Encounter (Signed)
Husband not listed on DPR. Called to get patient's verbal permission to answer Husband's question and was informed that she has had a stroke and is non-verbal. Husband was upset because he is not on the [facility] and asked why our practice changed the DPR. Explained that DPR was changed at visit in May 2019 and her daughter Lattie Haw was listed as only person on [place]. He became upset and does not want Lattie Haw called to answer his question. He reported he may just send monitor to daughter's house to let her handle everything . Patient does not live with the daughter and husband informed that monitor should be located where patient lives in order to benefit from monitoring. No medical information concening patient given to spouse and educated that DPR would have to be changed in person.

## 2019-03-31 NOTE — Telephone Encounter (Signed)
Patient's husband calling to speak with a nurse about the patient's pacer. He says he was told the manufacturer has changed.

## 2019-03-31 NOTE — Telephone Encounter (Signed)
Patient's daughter contacted and husband in the room and given permission to receive info. Last DPR on file only had daughter listed and husband filled it out assuming that he was just adding her and did not fill in his name. Informed change to add him to [facility] would need to be made in person.  Verified that last remote transmission was received nad device function WNL and no alerts received . Next transmission date given. Device clinic number provided for daughter to check to see if transmissions received after sent if no change made on DPR until after 4/ 2021.

## 2019-03-31 NOTE — Telephone Encounter (Signed)
Patient's daughter, Lattie Haw, is calling to follow up in regards to patient's husband not being listed on DPR, stating she would like to discuss. Please call to clarify.

## 2019-04-01 ENCOUNTER — Other Ambulatory Visit: Payer: Self-pay | Admitting: *Deleted

## 2019-04-01 MED ORDER — SIMVASTATIN 40 MG PO TABS: 40.0000 mg | ORAL_TABLET | Freq: Every day | ORAL | 0 refills | Status: DC

## 2019-04-03 ENCOUNTER — Telehealth: Payer: Self-pay | Admitting: Family Medicine

## 2019-04-03 ENCOUNTER — Other Ambulatory Visit: Payer: Self-pay | Admitting: Family Medicine

## 2019-04-03 NOTE — Telephone Encounter (Signed)
Please advise on request for antibiotic.

## 2019-04-03 NOTE — Telephone Encounter (Signed)
What symptoms do you have? Odor,Burning Patient knows she has a UTI and she is not drinking enough of water and husband has expressed to her she needs to drink more water. Dettinger has been nice enough to call her in a ABX and it helps her.  How long have you been sick? Started last night  Have you been seen for this problem? Yes   If your provider decides to give you a prescription, which pharmacy would you like for it to be sent to? [facility] [address] in [place]   Patient informed that this information will be sent to the clinical staff for review and that they should receive a follow up call.

## 2019-04-04 MED ORDER — CEPHALEXIN 500 MG PO CAPS: 500.0000 mg | ORAL_CAPSULE | Freq: Four times a day (QID) | ORAL | 0 refills | Status: DC

## 2019-04-04 NOTE — Telephone Encounter (Signed)
I sent Keflex for the patient

## 2019-04-04 NOTE — Telephone Encounter (Signed)
Patient's husband aware that antibiotic has been sent.

## 2019-04-16 ENCOUNTER — Telehealth: Payer: Self-pay | Admitting: Family Medicine

## 2019-04-16 MED ORDER — METHOTREXATE 2.5 MG PO TABS: ORAL_TABLET | ORAL | 0 refills | Status: DC

## 2019-04-16 MED ORDER — TRULICITY 0.75 MG/0.5ML ~~LOC~~ SOAJ: 0.7500 mg | SUBCUTANEOUS | 3 refills | Status: DC

## 2019-04-16 NOTE — Telephone Encounter (Signed)
Have her let us know if they continue to have issues, more than happy to send in a 1 month refill of the dose that she is currently on to bridge her until she can get back with them. Let us know Caryl Pina, MD [facility] 04/16/2019, 3:18 PM

## 2019-04-16 NOTE — Telephone Encounter (Signed)
Patient aware and verbalized understanding. °

## 2019-04-16 NOTE — Telephone Encounter (Signed)
I would say to continue it, especially if her blood sugars have finally been running better.  Unfortunately there is not a cheaper generic version of this type of medication and she is already on some cheap generics that we could do the glimepiride and Metformin, and he is on the other medication on the market is newer and more expensive.  There is another brand that sometimes might be cheaper if this 1 is really expensive that is called Ozempic, she can always call her insurance company and see if that one would be cheaper  I have sent a prescription for her Caryl Pina, MD [facility] 04/16/2019, 1:04 PM

## 2019-04-16 NOTE — Telephone Encounter (Signed)
Sent a 1 month refill for the patient

## 2019-04-16 NOTE — Telephone Encounter (Signed)
Needs a month supply until they can get it

## 2019-04-17 NOTE — Telephone Encounter (Signed)
Patient's husband aware °

## 2019-04-24 ENCOUNTER — Other Ambulatory Visit: Payer: Self-pay

## 2019-04-24 ENCOUNTER — Ambulatory Visit (INDEPENDENT_AMBULATORY_CARE_PROVIDER_SITE_OTHER): Payer: Medicare Other | Admitting: Family Medicine

## 2019-04-24 ENCOUNTER — Encounter: Payer: Self-pay | Admitting: Family Medicine

## 2019-04-24 DIAGNOSIS — R3989 Other symptoms and signs involving the genitourinary system: Secondary | ICD-10-CM | POA: Diagnosis not present

## 2019-04-24 NOTE — Patient Instructions (Signed)
Urinary Tract Infection, Adult A urinary tract infection (UTI) is an infection of any part of the urinary tract. The urinary tract includes:  The kidneys.  The ureters.  The bladder.  The urethra. These organs make, store, and get rid of pee (urine) in the body. What are the causes? This is caused by germs (bacteria) in your genital area. These germs grow and cause swelling (inflammation) of your urinary tract. What increases the risk? You are more likely to develop this condition if:  You have a small, thin tube (catheter) to drain pee.  You cannot control when you pee or poop (incontinence).  You are female, and: ? You use these methods to prevent pregnancy:  A medicine that kills sperm (spermicide).  A device that blocks sperm (diaphragm). ? You have low levels of a female hormone (estrogen). ? You are pregnant.  You have genes that add to your risk.  You are sexually active.  You take antibiotic medicines.  You have trouble peeing because of: ? A prostate that is bigger than normal, if you are female. ? A blockage in the part of your body that drains pee from the bladder (urethra). ? A kidney stone. ? A nerve condition that affects your bladder (neurogenic bladder). ? Not getting enough to drink. ? Not peeing often enough.  You have other conditions, such as: ? Diabetes. ? A weak disease-fighting system (immune system). ? Sickle cell disease. ? Gout. ? Injury of the spine. What are the signs or symptoms? Symptoms of this condition include:  Needing to pee right away (urgently).  Peeing often.  Peeing small amounts often.  Pain or burning when peeing.  Blood in the pee.  Pee that smells bad or not like normal.  Trouble peeing.  Pee that is cloudy.  Fluid coming from the vagina, if you are female.  Pain in the belly or lower back. Other symptoms include:  Throwing up (vomiting).  No urge to eat.  Feeling mixed up (confused).  Being tired  and grouchy (irritable).  A fever.  Watery poop (diarrhea). How is this treated? This condition may be treated with:  Antibiotic medicine.  Other medicines.  Drinking enough water. Follow these instructions at home:  Medicines  Take over-the-counter and prescription medicines only as told by your doctor.  If you were prescribed an antibiotic medicine, take it as told by your doctor. Do not stop taking it even if you start to feel better. General instructions  Make sure you: ? Pee until your bladder is empty. ? Do not hold pee for a long time. ? Empty your bladder after sex. ? Wipe from front to back after pooping if you are a female. Use each tissue one time when you wipe.  Drink enough fluid to keep your pee pale yellow.  Keep all follow-up visits as told by your doctor. This is important. Contact a doctor if:  You do not get better after 1-2 days.  Your symptoms go away and then come back. Get help right away if:  You have very bad back pain.  You have very bad pain in your lower belly.  You have a fever.  You are sick to your stomach (nauseous).  You are throwing up. Summary  A urinary tract infection (UTI) is an infection of any part of the urinary tract.  This condition is caused by germs in your genital area.  There are many risk factors for a UTI. These include having a small, thin   tube to drain pee and not being able to control when you pee or poop.  Treatment includes antibiotic medicines for germs.  Drink enough fluid to keep your pee pale yellow. This information is not intended to replace advice given to you by your health care provider. Make sure you discuss any questions you have with your health care provider. Document Revised: 01/24/2018 Document Reviewed: 08/16/2017 Elsevier Patient Education  2020 [employer]  

## 2019-04-24 NOTE — Progress Notes (Signed)
Virtual Visit via Telephone Note  I connected with [name] on 04/24/19 at 10:02 AM by telephone and verified that I am speaking with the correct person using two identifiers. [name] is currently located at home and her husband is currently with her during this visit, which she is okay with. He is her primary caregiver. The provider, Loman Brooklyn, FNP is located in their home at time of visit.  I discussed the limitations, risks, security and privacy concerns of performing an evaluation and management service by telephone and the availability of in person appointments. I also discussed with the patient that there may be a patient responsible charge related to this service. The patient expressed understanding and agreed to proceed.  Subjective: PCP: Dettinger, Fransisca Kaufmann, MD  Chief Complaint  Patient presents with  . Urinary Tract Infection   Urinary Tract Infection: Patient complains of dysuria, frequency and urgency.  She has had symptoms for a few days. Patient denies fever. Patient does have a history of recurrent UTI.  Patient does not have a history of pyelonephritis. She does have incontinence and wears depends.  Mr. Kanaan reports the patient was seen Dr. Jeffie Pollock, urologist.  He states they were last seen in October.  Patient used to be on daily Keflex for suppression but is no longer on this.   ROS: Per HPI  Current Outpatient Medications:  .  acetaminophen (TYLENOL) 500 MG tablet, Take 500 mg by mouth every 6 (six) hours as needed (pain). , Disp: , Rfl:  .  Ascorbic Acid (VITAMIN C PO), Take 1 tablet by mouth daily., Disp: , Rfl:  .  Calcium Carbonate-Vitamin D (CALCIUM-VITAMIN D) 500-200 MG-UNIT per tablet, Take 1 tablet by mouth daily., Disp: , Rfl:  .  cephALEXin (KEFLEX) 500 MG capsule, Take 1 capsule (500 mg total) by mouth 4 (four) times daily., Disp: 28 capsule, Rfl: 0 .  cholecalciferol (VITAMIN D) 1000 UNITS tablet, Take 2,000 Units by mouth daily. , Disp: ,  Rfl:  .  citalopram (CELEXA) 20 MG tablet, Take 1 tablet (20 mg total) by mouth 2 (two) times daily., Disp: 180 tablet, Rfl: 3 .  cyclobenzaprine (FLEXERIL) 5 MG tablet, Take 1 tablet (5 mg total) by mouth 3 (three) times daily as needed for muscle spasms., Disp: 30 tablet, Rfl: 0 .  dicyclomine (BENTYL) 10 MG capsule, TAKE 1 CAPSULE BY MOUTH FOUR TIMES DAILY, BEFORE MEALS AND AT BEDTIME, Disp: 360 capsule, Rfl: 0 .  Dulaglutide (TRULICITY) A999333 0000000 SOPN, Inject 0.75 mg into the skin once a week., Disp: 12 pen, Rfl: 3 .  fluticasone (FLONASE) 50 MCG/ACT nasal spray, SHAKE LIQUID AND USE 2 SPRAYS IN EACH NOSTRIL DAILY, Disp: 48 g, Rfl: 1 .  folic acid (FOLVITE) 1 MG tablet, TAKE 1 TABLET(1 MG) BY MOUTH DAILY (Patient taking differently: 2 mg. ), Disp: 90 tablet, Rfl: 0 .  furosemide (LASIX) 20 MG tablet, Take 1 tablet (20 mg total) by mouth daily., Disp: 90 tablet, Rfl: 3 .  glimepiride (AMARYL) 2 MG tablet, TAKE 1 TABLET(2 MG) BY MOUTH DAILY BEFORE BREAKFAST, Disp: 90 tablet, Rfl: 0 .  glucose blood (ONETOUCH ULTRA) test strip, Test BS daily Dx E11.9, Disp: 100 strip, Rfl: 3 .  levETIRAcetam (KEPPRA) 500 MG tablet, TAKE 1 TABLET(500 MG) BY MOUTH TWICE DAILY, Disp: 180 tablet, Rfl: 0 .  levothyroxine (SYNTHROID) 88 MCG tablet, TAKE 1 TABLET BY MOUTH EVERY DAY BEFORE BREAKFAST, Disp: 90 tablet, Rfl: 0 .  loperamide (IMODIUM)  2 MG capsule, Take by mouth as needed for diarrhea or loose stools (Take as directed). Reported on 05/17/2015, Disp: , Rfl:  .  metFORMIN (GLUCOPHAGE-XR) 500 MG 24 hr tablet, Take 2 tablets (1,000 mg total) by mouth 2 (two) times daily after a meal., Disp: 180 tablet, Rfl: 1 .  methotrexate (RHEUMATREX) 2.5 MG tablet, Take 4 tablets (10 mg total) by mouth once a week. Caution:Chemotherapy. Protect from light., Disp: 16 tablet, Rfl: 0 .  metoprolol tartrate (LOPRESSOR) 50 MG tablet, Take 1 tablet (50 mg total) by mouth 2 (two) times daily., Disp: 180 tablet, Rfl: 3 .   mirtazapine (REMERON) 15 MG tablet, Take 1 tablet (15 mg total) by mouth at bedtime., Disp: 90 tablet, Rfl: 3 .  Multiple Vitamins-Minerals (EYE VITAMINS PO), Take 2 tablets by mouth daily. AREDS formula, Disp: , Rfl:  .  NYSTATIN powder, APPLY TWICE DAILY UNDERNEATH BREASTS, Disp: 60 g, Rfl: 0 .  omeprazole (PRILOSEC) 40 MG capsule, TAKE 1 CAPSULE BY MOUTH EVERY DAY, Disp: 90 capsule, Rfl: 0 .  potassium chloride SA (KLOR-CON) 20 MEQ tablet, Take 2 tablets (40 mEq total) by mouth 2 (two) times daily., Disp: 360 tablet, Rfl: 1 .  Probiotic Product (PROBIOTIC PO), Take 1 capsule by mouth daily., Disp: , Rfl:  .  rivaroxaban (XARELTO) 20 MG TABS tablet, Take 1 tablet (20 mg total) by mouth daily., Disp: 90 tablet, Rfl: 3 .  simvastatin (ZOCOR) 40 MG tablet, Take 1 tablet (40 mg total) by mouth daily., Disp: 90 tablet, Rfl: 0 .  traMADol (ULTRAM) 50 MG tablet, Take 1 tablet (50 mg total) by mouth every 6 (six) hours as needed. Take 1/2 tab by mouth four times daily as needed for severe pain (Patient taking differently: Take 25 mg by mouth 4 (four) times daily as needed. ), Disp: 90 tablet, Rfl: 1  Allergies  Allergen Reactions  . Latex Rash  . Caudal Tray [Lidocaine-Epinephrine]     Edema    . Codeine Nausea Only  . Diovan [Valsartan] Itching  . Doxycycline Other (See Comments)    Might have caused heart to race per pt - not sure  . Esomeprazole Magnesium     Headache    . Levofloxacin     Insomnia    . Myrbetriq [Mirabegron] Diarrhea  . Neomycin-Bacitracin Zn-Polymyx     rash  . Penicillins     hives  . Sulfa Drugs Cross Reactors Other (See Comments)    Pt not sure what reaction was  . Verapamil     Edema   . Vimovo [Naproxen-Esomeprazole]     Upset stomach    Past Medical History:  Diagnosis Date  . Anemia   . Bronchial spasms   . Chronic anxiety   . Chronic kidney disease   . Complication of anesthesia    " I shake real bad "  . Diverticulosis   . Elevated blood sugar    . Esophageal stricture   . Family history of anesthesia complication    Daughter also shakes while waking up  . GERD (gastroesophageal reflux disease)   . H/O hiatal hernia   . H/O scarlet fever   . History of bladder repair surgery   . Hx of vaginal hysterectomy   . Hypertension   . Hypothyroidism   . IBS (irritable bowel syndrome)    vs diarrhea vs abd. fullness   . Impaired glucose tolerance   . Neuromuscular disorder ([place])    periferal neuropathy  . Pacemaker  St. Jude  . Paroxysmal atrial fibrillation (HCC)    a. failed flecainide, tikosyn, amio;  b. 01/2012 s/p RFCA.  . Psoriasis   . Psoriasis   . Rectocele, female   . Seizures ([place])   . Stroke ([place])   . Tachy-brady syndrome ([place])    a. 08/19/2008 s/p PPM: SJM 2110 Accent  . Uterine prolapse   . Vaginal prolapse 1998    Observations/Objective: A&O  No respiratory distress or wheezing audible over the phone Mood, judgement, and thought processes all WNL  Assessment and Plan: 1. Suspected UTI - Discussed that I would like to get a urine specimen before prescribing antibiotics as previous urine cultures revealed a lot of resistance.  Patient and her husband verbalized understanding.  He will bring a sterile specimen to the office as soon as possible at which time I will prescribe an antibiotic.  I did also recommend that he reach out to the urologist and get her rescheduled for a follow-up appointment due to recurrent UTIs. - Urinalysis, Complete - Urine Culture   Follow Up Instructions:  I discussed the assessment and treatment plan with the patient. The patient was provided an opportunity to ask questions and all were answered. The patient agreed with the plan and demonstrated an understanding of the instructions.   The patient was advised to call back or seek an in-person evaluation if the symptoms worsen or if the condition fails to improve as anticipated.  The above assessment and management plan was  discussed with the patient. The patient verbalized understanding of and has agreed to the management plan. Patient is aware to call the clinic if symptoms persist or worsen. Patient is aware when to return to the clinic for a follow-up visit. Patient educated on when it is appropriate to go to the emergency department.   Time call ended: 10:20 AM  I provided 20 minutes of non-face-to-face time during this encounter.  Hendricks Limes, MSN, APRN, FNP-C [facility] 04/24/19

## 2019-04-29 ENCOUNTER — Other Ambulatory Visit: Payer: Self-pay | Admitting: Family Medicine

## 2019-04-30 ENCOUNTER — Other Ambulatory Visit: Payer: Self-pay

## 2019-05-01 ENCOUNTER — Ambulatory Visit (INDEPENDENT_AMBULATORY_CARE_PROVIDER_SITE_OTHER): Payer: Medicare Other | Admitting: Family Medicine

## 2019-05-01 ENCOUNTER — Encounter: Payer: Self-pay | Admitting: Family Medicine

## 2019-05-01 VITALS — BP 140/75 | HR 82 | Temp 98.0°F | Ht 64.0 in | Wt 174.0 lb

## 2019-05-01 DIAGNOSIS — E119 Type 2 diabetes mellitus without complications: Secondary | ICD-10-CM

## 2019-05-01 DIAGNOSIS — E1159 Type 2 diabetes mellitus with other circulatory complications: Secondary | ICD-10-CM

## 2019-05-01 DIAGNOSIS — K439 Ventral hernia without obstruction or gangrene: Secondary | ICD-10-CM | POA: Diagnosis not present

## 2019-05-01 DIAGNOSIS — E034 Atrophy of thyroid (acquired): Secondary | ICD-10-CM

## 2019-05-01 DIAGNOSIS — R300 Dysuria: Secondary | ICD-10-CM

## 2019-05-01 LAB — BAYER DCA HB A1C WAIVED: HB A1C (BAYER DCA - WAIVED): 7 % — ABNORMAL HIGH (ref <7.0)

## 2019-05-01 MED ORDER — FUROSEMIDE 20 MG PO TABS: 20.0000 mg | ORAL_TABLET | Freq: Every day | ORAL | 3 refills | Status: DC

## 2019-05-01 MED ORDER — METOPROLOL TARTRATE 50 MG PO TABS: 50.0000 mg | ORAL_TABLET | Freq: Two times a day (BID) | ORAL | 3 refills | Status: AC

## 2019-05-01 MED ORDER — SIMVASTATIN 40 MG PO TABS: 40.0000 mg | ORAL_TABLET | Freq: Every day | ORAL | 0 refills | Status: DC

## 2019-05-01 MED ORDER — OMEPRAZOLE 40 MG PO CPDR: 40.0000 mg | DELAYED_RELEASE_CAPSULE | Freq: Every day | ORAL | 3 refills | Status: DC

## 2019-05-01 MED ORDER — METFORMIN HCL ER 500 MG PO TB24: 500.0000 mg | ORAL_TABLET | Freq: Every day | ORAL | 3 refills | Status: DC

## 2019-05-01 MED ORDER — MIRTAZAPINE 15 MG PO TABS: 15.0000 mg | ORAL_TABLET | Freq: Every day | ORAL | 3 refills | Status: DC

## 2019-05-01 MED ORDER — RIVAROXABAN 20 MG PO TABS: 20.0000 mg | ORAL_TABLET | Freq: Every day | ORAL | 3 refills | Status: DC

## 2019-05-01 MED ORDER — METHOTREXATE 2.5 MG PO TABS: ORAL_TABLET | ORAL | 3 refills | Status: AC

## 2019-05-01 MED ORDER — CITALOPRAM HYDROBROMIDE 20 MG PO TABS: 20.0000 mg | ORAL_TABLET | Freq: Two times a day (BID) | ORAL | 3 refills | Status: AC

## 2019-05-01 MED ORDER — LEVOTHYROXINE SODIUM 88 MCG PO TABS: 88.0000 ug | ORAL_TABLET | Freq: Every day | ORAL | 3 refills | Status: AC

## 2019-05-01 MED ORDER — GLIMEPIRIDE 2 MG PO TABS: 2.0000 mg | ORAL_TABLET | Freq: Every day | ORAL | 3 refills | Status: DC

## 2019-05-01 NOTE — Progress Notes (Signed)
BP 140/75   Pulse 82   Temp 98 F (36.7 C)   Ht 5\' 4"  (1.626 m)   Wt 174 lb (78.9 kg)   SpO2 98%   BMI 29.87 kg/m    Subjective:   Patient ID: [name], female    DOB: 1945/06/08, 74 y.o.   MRN: [id number]  HPI: [name] is a 74 y.o. female presenting on 05/01/2019 for Medical Management of Chronic Issues   HPI Hypothyroidism recheck Patient is coming in for thyroid recheck today as well. They deny any issues with hair changes or heat or cold problems or diarrhea or constipation. They deny any chest pain or palpitations. They are currently on levothyroxine 88 micrograms   Type 2 diabetes mellitus Patient comes in today for recheck of his diabetes. Patient has been currently taking Trulicity and glimepiride and Metformin. Patient is not currently on an ACE inhibitor/ARB. Patient has not seen an ophthalmologist this year. Patient denies any issues with their feet.   Patient has been having dysuria she was just treated for urinary tract infection which she just finished the antibiotic a couple days ago.  Patient has frequency but denies any fevers or chills or lower abdominal pain or flank pain.  Patient has a history of ventral hernia that has been going on for much time.  Patient says she has some tenderness around it but denies any constipation or blood in her stool.  She says is gotten much larger and much more sore much more frequently and is finally ready to go and get something done about it.  She says the pain is mild and not intense or severe.  Relevant past medical, surgical, family and social history reviewed and updated as indicated. Interim medical history since our last visit reviewed. Allergies and medications reviewed and updated.  Review of Systems  Constitutional: Negative for chills and fever.  Eyes: Negative for visual disturbance.  Respiratory: Negative for chest tightness and shortness of breath.   Cardiovascular: Negative for chest pain and leg  swelling.  Gastrointestinal: Positive for abdominal pain (Around ventral hernia).  Genitourinary: Positive for dysuria, frequency and urgency. Negative for difficulty urinating, flank pain and hematuria.  Musculoskeletal: Negative for back pain and gait problem.  Skin: Negative for rash.  Neurological: Negative for light-headedness and headaches.  Psychiatric/Behavioral: Negative for agitation and behavioral problems.  All other systems reviewed and are negative.   Per HPI unless specifically indicated above   Allergies as of 05/01/2019      Reactions   Latex Rash   Caudal Tray [lidocaine-epinephrine]    Edema    Codeine Nausea Only   Diovan [valsartan] Itching   Doxycycline Other (See Comments)   Might have caused heart to race per pt - not sure   Esomeprazole Magnesium    Headache    Levofloxacin    Insomnia    Myrbetriq [mirabegron] Diarrhea   Neomycin-bacitracin Zn-polymyx    rash   Penicillins    hives   Sulfa Drugs Cross Reactors Other (See Comments)   Pt not sure what reaction was   Verapamil    Edema   Vimovo [naproxen-esomeprazole]    Upset stomach      Medication List       Accurate as of May 01, 2019 11:59 PM. If you have any questions, ask your nurse or doctor.        acetaminophen 500 MG tablet Commonly known as: TYLENOL Take 500 mg by mouth every 6 (  six) hours as needed (pain).   calcium-vitamin D 500-200 MG-UNIT tablet Take 1 tablet by mouth daily.   cholecalciferol 1000 units tablet Commonly known as: VITAMIN D Take 2,000 Units by mouth daily.   citalopram 20 MG tablet Commonly known as: CELEXA Take 1 tablet (20 mg total) by mouth 2 (two) times daily.   cyclobenzaprine 5 MG tablet Commonly known as: FLEXERIL Take 1 tablet (5 mg total) by mouth 3 (three) times daily as needed for muscle spasms.   dicyclomine 10 MG capsule Commonly known as: BENTYL TAKE 1 CAPSULE BY MOUTH FOUR TIMES DAILY, BEFORE MEALS AND AT BEDTIME   EYE VITAMINS  PO Take 2 tablets by mouth daily. AREDS formula   fluticasone 50 MCG/ACT nasal spray Commonly known as: FLONASE SHAKE LIQUID AND USE 2 SPRAYS IN EACH NOSTRIL DAILY   folic acid 1 MG tablet Commonly known as: FOLVITE TAKE 1 TABLET(1 MG) BY MOUTH DAILY What changed: See the new instructions.   furosemide 20 MG tablet Commonly known as: LASIX Take 1 tablet (20 mg total) by mouth daily.   glimepiride 2 MG tablet Commonly known as: AMARYL Take 1 tablet (2 mg total) by mouth daily with breakfast. What changed: See the new instructions. Changed by: Fransisca Kaufmann Nakhi Choi, MD   levETIRAcetam 500 MG tablet Commonly known as: KEPPRA TAKE 1 TABLET(500 MG) BY MOUTH TWICE DAILY   levothyroxine 88 MCG tablet Commonly known as: SYNTHROID Take 1 tablet (88 mcg total) by mouth daily before breakfast. What changed: See the new instructions. Changed by: Fransisca Kaufmann Lancelot Alyea, MD   loperamide 2 MG capsule Commonly known as: IMODIUM Take by mouth as needed for diarrhea or loose stools (Take as directed). Reported on 05/17/2015   metFORMIN 500 MG 24 hr tablet Commonly known as: GLUCOPHAGE-XR Take 1 tablet (500 mg total) by mouth daily with breakfast. What changed: See the new instructions. Changed by: Worthy Rancher, MD   methotrexate 2.5 MG tablet Commonly known as: RHEUMATREX Take 4 tablets (10 mg total) by mouth once a week. Caution:Chemotherapy. Protect from light.   metoprolol tartrate 50 MG tablet Commonly known as: LOPRESSOR Take 1 tablet (50 mg total) by mouth 2 (two) times daily.   mirtazapine 15 MG tablet Commonly known as: REMERON Take 1 tablet (15 mg total) by mouth at bedtime.   nystatin powder Generic drug: nystatin APPLY TWICE DAILY UNDERNEATH BREASTS   omeprazole 40 MG capsule Commonly known as: PRILOSEC Take 1 capsule (40 mg total) by mouth daily. What changed: how much to take Changed by: Worthy Rancher, MD   OneTouch Ultra test strip Generic drug: glucose  blood Test BS daily Dx E11.9   potassium chloride SA 20 MEQ tablet Commonly known as: KLOR-CON Take 2 tablets (40 mEq total) by mouth 2 (two) times daily.   PROBIOTIC PO Take 1 capsule by mouth daily.   rivaroxaban 20 MG Tabs tablet Commonly known as: Xarelto Take 1 tablet (20 mg total) by mouth daily.   simvastatin 40 MG tablet Commonly known as: ZOCOR Take 1 tablet (40 mg total) by mouth daily.   traMADol 50 MG tablet Commonly known as: ULTRAM Take 1 tablet (50 mg total) by mouth every 6 (six) hours as needed. Take 1/2 tab by mouth four times daily as needed for severe pain What changed:   how much to take  when to take this  additional instructions   Trulicity [id number] [id number] Sopn Generic drug: Dulaglutide Inject 0.75 mg into the skin once a  week.   VITAMIN C PO Take 1 tablet by mouth daily.        Objective:   BP 140/75   Pulse 82   Temp 98 F (36.7 C)   Ht 5\' 4"  (1.626 m)   Wt 174 lb (78.9 kg)   SpO2 98%   BMI 29.87 kg/m   Wt Readings from Last 3 Encounters:  05/01/19 174 lb (78.9 kg)  03/18/19 176 lb (79.8 kg)  08/19/18 175 lb (79.4 kg)    Physical Exam Vitals and nursing note reviewed.  Constitutional:      General: She is not in acute distress.    Appearance: She is well-developed. She is not diaphoretic.  Eyes:     Conjunctiva/sclera: Conjunctivae normal.  Cardiovascular:     Rate and Rhythm: Normal rate and regular rhythm.     Heart sounds: Normal heart sounds. No murmur.  Pulmonary:     Effort: Pulmonary effort is normal. No respiratory distress.     Breath sounds: Normal breath sounds. No wheezing.  Abdominal:     Tenderness: There is abdominal tenderness.    Skin:    General: Skin is warm and dry.     Findings: No rash.  Neurological:     Mental Status: She is alert and oriented to person, place, and time.     Coordination: Coordination normal.  Psychiatric:        Behavior: Behavior normal.      U/A: >30 and 0-2 rbcs,  and 0-10 epis, crystals present, calcium oxylate, many bacteria, nit + Assessment & Plan:   Problem List Items Addressed This Visit      Endocrine   Hypothyroidism   Relevant Medications   metoprolol tartrate (LOPRESSOR) 50 MG tablet   levothyroxine (SYNTHROID) 88 MCG tablet   Other Relevant Orders   TSH (Completed)   Type 2 diabetes mellitus (HCC) - Primary   Relevant Medications   simvastatin (ZOCOR) 40 MG tablet   metFORMIN (GLUCOPHAGE-XR) 500 MG 24 hr tablet   glimepiride (AMARYL) 2 MG tablet   Dulaglutide (TRULICITY) [id number] [id number] SOPN   Other Relevant Orders   Bayer DCA Hb A1c Waived (Completed)    Other Visit Diagnoses    Dysuria       Relevant Orders   Urine Culture (Completed)   Ventral hernia without obstruction or gangrene       Relevant Orders   Ambulatory referral to General Surgery      Will check urine culture, repeated infections, will await culture for treatment.  Just started trulicity 2 days ago.  So we will not make any other changes.  Will refer to general surgery. Follow up plan: Return in about 3 months (around 08/01/2019), or if symptoms worsen or fail to improve, for diabetes and thyroid.  Counseling provided for all of the vaccine components Orders Placed This Encounter  Procedures  . Urine Culture  . Bayer DCA Hb A1c Waived  . TSH  . Ambulatory referral to [facility] [place] Jhana Giarratano, MD [facility] 05/04/2019, 10:14 PM

## 2019-05-02 LAB — TSH: TSH: 2.05 u[IU]/mL (ref 0.450–4.500)

## 2019-05-02 MED ORDER — RIVAROXABAN 20 MG PO TABS: 20.0000 mg | ORAL_TABLET | Freq: Every day | ORAL | 3 refills | Status: DC

## 2019-05-02 MED ORDER — TRULICITY 0.75 MG/0.5ML ~~LOC~~ SOAJ: 0.7500 mg | SUBCUTANEOUS | 3 refills | Status: DC

## 2019-05-04 LAB — URINE CULTURE

## 2019-05-06 ENCOUNTER — Other Ambulatory Visit: Payer: Self-pay | Admitting: Family Medicine

## 2019-05-06 MED ORDER — CEPHALEXIN 500 MG PO CAPS: 500.0000 mg | ORAL_CAPSULE | Freq: Four times a day (QID) | ORAL | 0 refills | Status: DC

## 2019-05-26 ENCOUNTER — Encounter: Payer: Self-pay | Admitting: Family Medicine

## 2019-05-26 ENCOUNTER — Ambulatory Visit (INDEPENDENT_AMBULATORY_CARE_PROVIDER_SITE_OTHER): Payer: Medicare Other | Admitting: Family Medicine

## 2019-05-26 DIAGNOSIS — R3989 Other symptoms and signs involving the genitourinary system: Secondary | ICD-10-CM | POA: Diagnosis not present

## 2019-05-26 DIAGNOSIS — R300 Dysuria: Secondary | ICD-10-CM | POA: Diagnosis not present

## 2019-05-26 DIAGNOSIS — R350 Frequency of micturition: Secondary | ICD-10-CM | POA: Diagnosis not present

## 2019-05-26 MED ORDER — CEPHALEXIN 500 MG PO CAPS: 500.0000 mg | ORAL_CAPSULE | Freq: Three times a day (TID) | ORAL | 0 refills | Status: AC

## 2019-05-26 NOTE — Progress Notes (Signed)
Virtual Visit via telephone Note Due to COVID-19 pandemic this visit was conducted virtually. This visit type was conducted due to national recommendations for restrictions regarding the COVID-19 Pandemic (e.g. social distancing, sheltering in place) in an effort to limit this patient's exposure and mitigate transmission in our community. All issues noted in this document were discussed and addressed.  A physical exam was not performed with this format.   I connected with [name]'s husband and caretaker on 05/26/2019 at 1145 by telephone and verified that I am speaking with the correct person using two identifiers. [name] is currently located at 56 and husband is currently with them during visit. The provider, Monia Pouch, FNP is located in their office at time of visit.  I discussed the limitations, risks, security and privacy concerns of performing an evaluation and management service by telephone and the availability of in person appointments. I also discussed with the patient that there may be a patient responsible charge related to this service. The patient expressed understanding and agreed to proceed.  Subjective:  Patient ID: [name], female    DOB: 1945-10-07, 74 y.o.   MRN: [id number]  Chief Complaint:  Urinary Tract Infection   HPI: [name] is a 74 y.o. female presenting on 05/26/2019 for Urinary Tract Infection   Urinary Tract Infection  This is a new problem. The current episode started in the past 7 days. The problem occurs every urination. The problem has been gradually worsening. The quality of the pain is described as aching and burning. The pain is at a severity of 3/10. The pain is mild. There has been no fever. She is not sexually active. There is no history of pyelonephritis. Associated symptoms include frequency and urgency. Pertinent negatives include no chills, discharge, flank pain, hematuria, hesitancy, nausea, possible pregnancy, sweats or  vomiting. She has tried increased fluids for the symptoms. The treatment provided no relief. Her past medical history is significant for recurrent UTIs.     Relevant past medical, surgical, family, and social history reviewed and updated as indicated.  Allergies and medications reviewed and updated.   Past Medical History:  Diagnosis Date  . Anemia   . Bronchial spasms   . Chronic anxiety   . Chronic kidney disease   . Complication of anesthesia    " I shake real bad "  . Diverticulosis   . Elevated blood sugar   . Esophageal stricture   . Family history of anesthesia complication    Daughter also shakes while waking up  . GERD (gastroesophageal reflux disease)   . H/O hiatal hernia   . H/O scarlet fever   . History of bladder repair surgery   . Hx of vaginal hysterectomy   . Hypertension   . Hypothyroidism   . IBS (irritable bowel syndrome)    vs diarrhea vs abd. fullness   . Impaired glucose tolerance   . Neuromuscular disorder ([place])    periferal neuropathy  . Pacemaker    St. Jude  . Paroxysmal atrial fibrillation ([facility])    a. failed flecainide, tikosyn, amio;  b. 01/2012 s/p RFCA.  . Psoriasis   . Psoriasis   . Rectocele, female   . Seizures ([place])   . Stroke ([place])   . Tachy-brady syndrome ([place])    a. 08/19/2008 s/p PPM: SJM 2110 Accent  . Uterine prolapse   . Vaginal prolapse 1998    Past Surgical History:  Procedure Laterality Date  . ATRIAL  FIBRILLATION ABLATION  02/06/12   PVI by Dr Rayann Heman  . ATRIAL FIBRILLATION ABLATION  07/04/2012   repeat PVI by Dr Rayann Heman  . ATRIAL FIBRILLATION ABLATION N/A 02/06/2012   Procedure: ATRIAL FIBRILLATION ABLATION;  Surgeon: Thompson Grayer, MD;  Location: [facility] CATH LAB;  Service: Cardiovascular;  Laterality: N/A;  . ATRIAL FIBRILLATION ABLATION N/A 07/04/2012   Procedure: ATRIAL FIBRILLATION ABLATION;  Surgeon: Thompson Grayer, MD;  Location: [facility] CATH LAB;  Service: Cardiovascular;  Laterality: N/A;  . BLADDER SUSPENSION    .  INSERT / REPLACE / REMOVE PACEMAKER     SJM  . LEFT HEART CATHETERIZATION WITH CORONARY ANGIOGRAM N/A 03/24/2013   Procedure: LEFT HEART CATHETERIZATION WITH CORONARY ANGIOGRAM;  Surgeon: Blane Ohara, MD;  Location: [facility] CATH LAB;  Service: Cardiovascular;  Laterality: N/A;  . RECTOCELE REPAIR    . TEE WITHOUT CARDIOVERSION  02/06/2012   Procedure: TRANSESOPHAGEAL ECHOCARDIOGRAM (TEE);  Surgeon: Thayer Headings, MD;  Location: [facility];  Service: Cardiovascular;  Laterality: N/A;  . TEE WITHOUT CARDIOVERSION N/A 07/04/2012   Procedure: TRANSESOPHAGEAL ECHOCARDIOGRAM (TEE);  Surgeon: Lelon Perla, MD;  Location: [facility] ENDOSCOPY;  Service: Cardiovascular;  Laterality: N/A;  . TRANSTHORACIC ECHOCARDIOGRAM  2008  . VAGINAL HYSTERECTOMY     prolapse     Social History   Socioeconomic History  . Marital status: Married    Spouse name: [name]"  . Number of children: 2  . Years of education: 14 yrs  . Highest education level: Associate degree: academic program  Occupational History  . 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  Tobacco Use  . Smoking status: Never Smoker  . Smokeless tobacco: Never Used  Substance and Sexual Activity  . Alcohol use: No  . Drug use: No  . Sexual activity: Yes  Other Topics Concern  . Not on file  Social History Narrative   Lives in [place].      Left-handed   Caffeine use: drinks coffee/tea: 2 cups coffee in the morning, drinks 1 glass tea per day    Social Determinants of Health   Financial Resource Strain:   . Difficulty of Paying Living Expenses:   Food Insecurity:   . Worried About Charity fundraiser in the Last Year:   . Arboriculturist in the Last Year:   Transportation Needs:   . Film/video editor (Medical):   Marland Kitchen Lack of Transportation (Non-Medical):   Physical Activity:   . Days of Exercise per Week:   . Minutes of Exercise per Session:   Stress:   . Feeling of Stress :   Social Connections:     . Frequency of Communication with Friends and Family:   . Frequency of Social Gatherings with Friends and Family:   . Attends Religious Services:   . Active Member of Clubs or Organizations:   . Attends Archivist Meetings:   Marland Kitchen Marital Status:   Intimate Partner Violence:   . Fear of Current or Ex-Partner:   . Emotionally Abused:   Marland Kitchen Physically Abused:   . Sexually Abused:     Outpatient Encounter Medications as of 05/26/2019  Medication Sig  . acetaminophen (TYLENOL) 500 MG tablet Take 500 mg by mouth every 6 (six) hours as needed (pain).   . Ascorbic Acid (VITAMIN C PO) Take 1 tablet by mouth daily.  . Calcium Carbonate-Vitamin D (CALCIUM-VITAMIN D) 500-200 MG-UNIT per tablet Take 1 tablet by mouth daily.  . cephALEXin (KEFLEX) 500 MG  capsule Take 1 capsule (500 mg total) by mouth 3 (three) times daily for 7 days.  . cholecalciferol (VITAMIN D) 1000 UNITS tablet Take 2,000 Units by mouth daily.   . citalopram (CELEXA) 20 MG tablet Take 1 tablet (20 mg total) by mouth 2 (two) times daily.  . cyclobenzaprine (FLEXERIL) 5 MG tablet Take 1 tablet (5 mg total) by mouth 3 (three) times daily as needed for muscle spasms.  Marland Kitchen dicyclomine (BENTYL) 10 MG capsule TAKE 1 CAPSULE BY MOUTH FOUR TIMES DAILY, BEFORE MEALS AND AT BEDTIME  . Dulaglutide (TRULICITY) A999333 0000000 SOPN Inject 0.75 mg into the skin once a week.  . fluticasone (FLONASE) 50 MCG/ACT nasal spray SHAKE LIQUID AND USE 2 SPRAYS IN EACH NOSTRIL DAILY  . folic acid (FOLVITE) 1 MG tablet TAKE 1 TABLET(1 MG) BY MOUTH DAILY (Patient taking differently: 2 mg. )  . furosemide (LASIX) 20 MG tablet Take 1 tablet (20 mg total) by mouth daily.  Marland Kitchen glimepiride (AMARYL) 2 MG tablet Take 1 tablet (2 mg total) by mouth daily with breakfast.  . glucose blood (ONETOUCH ULTRA) test strip Test BS daily Dx E11.9  . levETIRAcetam (KEPPRA) 500 MG tablet TAKE 1 TABLET(500 MG) BY MOUTH TWICE DAILY  . levothyroxine (SYNTHROID) 88 MCG tablet Take  1 tablet (88 mcg total) by mouth daily before breakfast.  . loperamide (IMODIUM) 2 MG capsule Take by mouth as needed for diarrhea or loose stools (Take as directed). Reported on 05/17/2015  . metFORMIN (GLUCOPHAGE-XR) 500 MG 24 hr tablet Take 1 tablet (500 mg total) by mouth daily with breakfast.  . methotrexate (RHEUMATREX) 2.5 MG tablet Take 4 tablets (10 mg total) by mouth once a week. Caution:Chemotherapy. Protect from light.  . metoprolol tartrate (LOPRESSOR) 50 MG tablet Take 1 tablet (50 mg total) by mouth 2 (two) times daily.  . mirtazapine (REMERON) 15 MG tablet Take 1 tablet (15 mg total) by mouth at bedtime.  . Multiple Vitamins-Minerals (EYE VITAMINS PO) Take 2 tablets by mouth daily. AREDS formula  . NYSTATIN powder APPLY TWICE DAILY UNDERNEATH BREASTS  . omeprazole (PRILOSEC) 40 MG capsule Take 1 capsule (40 mg total) by mouth daily.  . potassium chloride SA (KLOR-CON) 20 MEQ tablet Take 2 tablets (40 mEq total) by mouth 2 (two) times daily.  . Probiotic Product (PROBIOTIC PO) Take 1 capsule by mouth daily.  . rivaroxaban (XARELTO) 20 MG TABS tablet Take 1 tablet (20 mg total) by mouth daily.  . simvastatin (ZOCOR) 40 MG tablet Take 1 tablet (40 mg total) by mouth daily.  . traMADol (ULTRAM) 50 MG tablet Take 1 tablet (50 mg total) by mouth every 6 (six) hours as needed. Take 1/2 tab by mouth four times daily as needed for severe pain (Patient taking differently: Take 25 mg by mouth 4 (four) times daily as needed. )  . [DISCONTINUED] cephALEXin (KEFLEX) 500 MG capsule Take 1 capsule (500 mg total) by mouth 4 (four) times daily.   No facility-administered encounter medications on file as of 05/26/2019.    Allergies  Allergen Reactions  . Latex Rash  . Caudal Tray [Lidocaine-Epinephrine]     Edema    . Codeine Nausea Only  . Diovan [Valsartan] Itching  . Doxycycline Other (See Comments)    Might have caused heart to race per pt - not sure  . Esomeprazole Magnesium     Headache     . Levofloxacin     Insomnia    . Myrbetriq [Mirabegron] Diarrhea  .  Neomycin-Bacitracin Zn-Polymyx     rash  . Penicillins     hives  . Sulfa Drugs Cross Reactors Other (See Comments)    Pt not sure what reaction was  . Verapamil     Edema   . Vimovo [Naproxen-Esomeprazole]     Upset stomach     Review of Systems  Constitutional: Negative for activity change, appetite change, chills, diaphoresis, fatigue, fever and unexpected weight change.  HENT: Negative.   Eyes: Negative.   Respiratory: Negative for cough, chest tightness and shortness of breath.   Cardiovascular: Negative for chest pain, palpitations and leg swelling.  Gastrointestinal: Negative for abdominal pain, blood in stool, constipation, diarrhea, nausea and vomiting.  Endocrine: Negative.   Genitourinary: Positive for dysuria, frequency and urgency. Negative for decreased urine volume, difficulty urinating, flank pain, hematuria and hesitancy.  Musculoskeletal: Negative for arthralgias, back pain and myalgias.  Skin: Negative.   Allergic/Immunologic: Negative.   Neurological: Negative for dizziness and headaches.  Hematological: Negative.   Psychiatric/Behavioral: Negative for confusion, hallucinations, sleep disturbance and suicidal ideas.  All other systems reviewed and are negative.        Observations/Objective: No vital signs or physical exam, this was a telephone or virtual health encounter.  Pt alert and oriented, answers all questions appropriately, and able to speak in full sentences.    Assessment and Plan: Dann[place]ola was seen today for urinary tract infection.  Diagnoses and all orders for this visit:  Dysuria Frequency of micturition Suspected UTI History of recurrent UTIs. Pt with return of symptoms. Has increased water intake without resolve of symptoms. Will initiate below per previous culture results and allergies. Symptomatic care discussed in detail. Medications as prescribed. Report  any new, worsening, or persistent symptoms.  -     cephALEXin (KEFLEX) 500 MG capsule; Take 1 capsule (500 mg total) by mouth 3 (three) times daily for 7 days.     Follow Up Instructions: Return if symptoms worsen or fail to improve.    I discussed the assessment and treatment plan with the patient. The patient was provided an opportunity to ask questions and all were answered. The patient agreed with the plan and demonstrated an understanding of the instructions.   The patient was advised to call back or seek an in-person evaluation if the symptoms worsen or if the condition fails to improve as anticipated.  The above assessment and management plan was discussed with the patient. The patient verbalized understanding of and has agreed to the management plan. Patient is aware to call the clinic if they develop any new symptoms or if symptoms persist or worsen. Patient is aware when to return to the clinic for a follow-up visit. Patient educated on when it is appropriate to go to the emergency department.    I provided 15 minutes of non-face-to-face time during this encounter. The call started at 1145. The call ended at 1155. The other time was used for coordination of care.    Monia Pouch, FNP-C [facility] [address] [place], [place] [address] [phone] 05/26/2019

## 2019-06-01 ENCOUNTER — Other Ambulatory Visit: Payer: Self-pay | Admitting: Family Medicine

## 2019-06-11 ENCOUNTER — Encounter: Payer: Self-pay | Admitting: Family Medicine

## 2019-06-11 ENCOUNTER — Other Ambulatory Visit: Payer: Self-pay

## 2019-06-11 ENCOUNTER — Ambulatory Visit (INDEPENDENT_AMBULATORY_CARE_PROVIDER_SITE_OTHER): Payer: Medicare Other | Admitting: Family Medicine

## 2019-06-11 DIAGNOSIS — N3090 Cystitis, unspecified without hematuria: Secondary | ICD-10-CM | POA: Diagnosis not present

## 2019-06-11 MED ORDER — CEPHALEXIN 500 MG PO CAPS: 500.0000 mg | ORAL_CAPSULE | Freq: Three times a day (TID) | ORAL | 0 refills | Status: DC

## 2019-06-11 NOTE — Progress Notes (Signed)
Subjective:    Patient ID: [name], female    DOB: [birth year], 74 y.o.   MRN: [id number]   HPI: [name] is a 74 y.o. female presenting for burning with urination and frequency for several days. Denies fever . No flank pain. No nausea, vomiting.Gets frequent UTI. Due to CVA, pt. Is immobile.    Depression screen [facility] 2/9 05/01/2019 03/03/2019 12/26/2018 08/19/2018 02/27/2018  Decreased Interest 1 0 0 0 3  Down, Depressed, Hopeless 1 0 0 1 3  PHQ - 2 Score 2 0 0 1 6  Altered sleeping - - - - 1  Tired, decreased energy - - - - 3  Change in appetite - - - - 3  Feeling bad or failure about yourself  - - - - 1  Trouble concentrating - - - - 3  Moving slowly or fidgety/restless - - - - 3  Suicidal thoughts - - - - 0  PHQ-9 Score - - - - 20  Difficult doing work/chores - - - - Somewhat difficult  Some recent data might be hidden     Relevant past medical, surgical, family and social history reviewed and updated as indicated.  Interim medical history since our last visit reviewed. Allergies and medications reviewed and updated.  ROS:  Review of Systems  Constitutional: Negative for chills, diaphoresis and fever.  HENT: Negative for congestion.   Eyes: Negative for visual disturbance.  Respiratory: Negative for cough and shortness of breath.   Cardiovascular: Negative for chest pain and palpitations.  Gastrointestinal: Negative for constipation, diarrhea and nausea.  Genitourinary: Positive for dysuria, frequency and urgency. Negative for decreased urine volume, flank pain, hematuria, menstrual problem and pelvic pain.  Musculoskeletal: Negative for arthralgias and joint swelling.  Skin: Negative for rash.  Neurological: Negative for dizziness and numbness.     Social History   Tobacco Use  Smoking Status Never Smoker  Smokeless Tobacco Never Used       Objective:     Wt Readings from Last 3 Encounters:  05/01/19 174 lb (78.9 kg)  03/18/19 176 lb (79.8 kg)    08/19/18 175 lb (79.4 kg)     Exam deferred. Pt. Harboring due to COVID 19. Phone visit performed.   Assessment & Plan:   1. Cystitis     Meds ordered this encounter  Medications  . cephALEXin (KEFLEX) 500 MG capsule    Sig: Take 1 capsule (500 mg total) by mouth 3 (three) times daily.    Dispense:  30 capsule    Refill:  0    No orders of the defined types were placed in this encounter.     Diagnoses and all orders for this visit:  Cystitis  Other orders -     cephALEXin (KEFLEX) 500 MG capsule; Take 1 capsule (500 mg total) by mouth 3 (three) times daily.    Virtual Visit via telephone Note  I discussed the limitations, risks, security and privacy concerns of performing an evaluation and management service by telephone and the availability of in person appointments. The patient was identified with two identifiers. Pt.expressed understanding and agreed to proceed. Pt. Is at home. Dr. Livia Snellen is in his office.  Follow Up Instructions:   I discussed the assessment and treatment plan with the patient. The patient was provided an opportunity to ask questions and all were answered. The patient agreed with the plan and demonstrated an understanding of the instructions.   The patient was advised to  call back or seek an in-person evaluation if the symptoms worsen or if the condition fails to improve as anticipated.   Total minutes including chart review and phone contact time: 7   Follow up plan: No follow-ups on file.  Claretta Fraise, MD [place]

## 2019-06-12 ENCOUNTER — Ambulatory Visit (INDEPENDENT_AMBULATORY_CARE_PROVIDER_SITE_OTHER): Payer: Medicare Other | Admitting: *Deleted

## 2019-06-12 DIAGNOSIS — I480 Paroxysmal atrial fibrillation: Secondary | ICD-10-CM

## 2019-06-15 ENCOUNTER — Other Ambulatory Visit: Payer: Self-pay | Admitting: Adult Health

## 2019-06-16 ENCOUNTER — Ambulatory Visit: Payer: Medicare Other | Admitting: Neurology

## 2019-06-16 LAB — CUP PACEART REMOTE DEVICE CHECK
Battery Remaining Longevity: 43 mo
Battery Remaining Percentage: 35 %
Battery Voltage: 2.83 V
Brady Statistic AP VP Percent: 1 %
Brady Statistic AP VS Percent: 1 %
Brady Statistic AS VP Percent: 1 %
Brady Statistic AS VS Percent: 99 %
Brady Statistic RA Percent Paced: 1 %
Brady Statistic RV Percent Paced: 1 %
Date Time Interrogation Session: 20210423153613
Implantable Lead Implant Date: 20100630
Implantable Lead Implant Date: 20100630
Implantable Lead Location: 753859
Implantable Lead Location: 753860
Implantable Pulse Generator Implant Date: 20100630
Lead Channel Impedance Value: 340 Ohm
Lead Channel Impedance Value: 430 Ohm
Lead Channel Pacing Threshold Amplitude: 0.75 V
Lead Channel Pacing Threshold Amplitude: 1.25 V
Lead Channel Pacing Threshold Pulse Width: 0.4 ms
Lead Channel Pacing Threshold Pulse Width: 0.8 ms
Lead Channel Sensing Intrinsic Amplitude: 4.9 mV
Lead Channel Sensing Intrinsic Amplitude: 6.4 mV
Lead Channel Setting Pacing Amplitude: 2 V
Lead Channel Setting Pacing Amplitude: 2.5 V
Lead Channel Setting Pacing Pulse Width: 0.8 ms
Lead Channel Setting Sensing Sensitivity: 1.5 mV
Pulse Gen Model: 2110
Pulse Gen Serial Number: 2285953

## 2019-06-23 ENCOUNTER — Ambulatory Visit: Payer: Medicare Other | Admitting: Neurology

## 2019-07-07 ENCOUNTER — Telehealth: Payer: Self-pay | Admitting: Family Medicine

## 2019-07-07 NOTE — Telephone Encounter (Signed)
Pts husband called to let Dr Dettinger know that pt wants to stop taking Trulicity shots. Says they curb her appetite too much and also prevents her from sleeping. Husband says she normally takes her shots every Sunday but pt did not take the shot yesterday and today her BS is 115.

## 2019-07-07 NOTE — Telephone Encounter (Signed)
Husband informed. Will continue injections and monitor sugars and log them. Husband will call in a couple of weeks with an update.

## 2019-07-07 NOTE — Telephone Encounter (Signed)
Please review and advise.

## 2019-07-07 NOTE — Telephone Encounter (Signed)
Tell her not to get rid of the shots and keep track of the blood sugars, it takes at least 2 to 3 weeks for it to be fully out of her system even though she did not take it on the Sunday, it will not reflect is out of her system for at least a couple weeks.

## 2019-07-22 ENCOUNTER — Telehealth: Payer: Self-pay | Admitting: Family Medicine

## 2019-07-22 ENCOUNTER — Ambulatory Visit (INDEPENDENT_AMBULATORY_CARE_PROVIDER_SITE_OTHER): Payer: Medicare Other | Admitting: Family Medicine

## 2019-07-22 DIAGNOSIS — R3989 Other symptoms and signs involving the genitourinary system: Secondary | ICD-10-CM | POA: Diagnosis not present

## 2019-07-22 LAB — URINALYSIS, COMPLETE
Bilirubin, UA: NEGATIVE
Glucose, UA: NEGATIVE
Ketones, UA: NEGATIVE
Nitrite, UA: POSITIVE — AB
Specific Gravity, UA: 1.02 (ref 1.005–1.030)
Urobilinogen, Ur: 0.2 mg/dL (ref 0.2–1.0)
pH, UA: 6 (ref 5.0–7.5)

## 2019-07-22 LAB — MICROSCOPIC EXAMINATION
RBC, Urine: >30 /hpf — AB (ref 0–2)
Renal Epithel, UA: NONE SEEN /hpf
WBC, UA: >30 /hpf — AB (ref 0–5)

## 2019-07-22 MED ORDER — CEFDINIR 300 MG PO CAPS: 300.0000 mg | ORAL_CAPSULE | Freq: Two times a day (BID) | ORAL | 0 refills | Status: DC

## 2019-07-22 NOTE — Telephone Encounter (Signed)
No available appointments can they bring in urine? Please advise?

## 2019-07-22 NOTE — Addendum Note (Signed)
Addended by: Liliane Bade on: 07/22/2019 03:05 PM   Modules accepted: Orders

## 2019-07-22 NOTE — Telephone Encounter (Signed)
done

## 2019-07-22 NOTE — Telephone Encounter (Signed)
  Incoming Patient Call  07/22/2019  What symptoms do you have? Urinating frequently, burning when she urintates How long have you been sick? 3-4 days  Have you been seen for this problem? no  If your provider decides to give you a prescription, which pharmacy would you like for it to be sent to? [facility] [address] [place]   Patient informed that this information will be sent to the clinical staff for review and that they should receive a follow up call.

## 2019-07-22 NOTE — Telephone Encounter (Signed)
Okay to bring in urine.  Double book my 1:15pm and I can do a virtual visit with her by telephone

## 2019-07-22 NOTE — Progress Notes (Signed)
Telephone visit  Subjective: CC: UTI PCP: Dettinger, Fransisca Kaufmann, MD [id number] [name] is a 74 y.o. female calls for telephone consult today. Patient provides verbal consent for consult held via phone.  Due to COVID-19 pandemic this visit was conducted virtually. This visit type was conducted due to national recommendations for restrictions regarding the COVID-19 Pandemic (e.g. social distancing, sheltering in place) in an effort to limit this patient's exposure and mitigate transmission in our community. All issues noted in this document were discussed and addressed.  A physical exam was not performed with this format.   Location of patient: home Location of provider: [facility] Others present for call: spouse  1. Urinary symptoms Patient reports a 3-4 day h/o dysuria.  No urinary frequency, urgency.  No hematuria, fevers, chills, abdominal pain, nausea, vomiting, back pain, vaginal discharge.  Patient a h/o frequent or recurrent UTIs.  She is seen by Dr Jeffie Pollock but has not followed up in about 8 months.     ROS: Per HPI  Allergies  Allergen Reactions  . Latex Rash  . Caudal Tray [Lidocaine-Epinephrine]     Edema    . Codeine Nausea Only  . Diovan [Valsartan] Itching  . Doxycycline Other (See Comments)    Might have caused heart to race per pt - not sure  . Esomeprazole Magnesium     Headache    . Levofloxacin     Insomnia    . Myrbetriq [Mirabegron] Diarrhea  . Neomycin-Bacitracin Zn-Polymyx     rash  . Penicillins     hives  . Sulfa Drugs Cross Reactors Other (See Comments)    Pt not sure what reaction was  . Verapamil     Edema   . Vimovo [Naproxen-Esomeprazole]     Upset stomach    Past Medical History:  Diagnosis Date  . Anemia   . Bronchial spasms   . Chronic anxiety   . Chronic kidney disease   . Complication of anesthesia    " I shake real bad "  . Diverticulosis   . Elevated blood sugar   . Esophageal stricture   . Family history of anesthesia complication     Daughter also shakes while waking up  . GERD (gastroesophageal reflux disease)   . H/O hiatal hernia   . H/O scarlet fever   . History of bladder repair surgery   . Hx of vaginal hysterectomy   . Hypertension   . Hypothyroidism   . IBS (irritable bowel syndrome)    vs diarrhea vs abd. fullness   . Impaired glucose tolerance   . Neuromuscular disorder ([place])    periferal neuropathy  . Pacemaker    St. Jude  . Paroxysmal atrial fibrillation ([facility])    a. failed flecainide, tikosyn, amio;  b. 01/2012 s/p RFCA.  . Psoriasis   . Psoriasis   . Rectocele, female   . Seizures ([place])   . Stroke ([place])   . Tachy-brady syndrome ([place])    a. 08/19/2008 s/p PPM: SJM 2110 Accent  . Uterine prolapse   . Vaginal prolapse 1998    Current Outpatient Medications:  .  acetaminophen (TYLENOL) 500 MG tablet, Take 500 mg by mouth every 6 (six) hours as needed (pain). , Disp: , Rfl:  .  Ascorbic Acid (VITAMIN C PO), Take 1 tablet by mouth daily., Disp: , Rfl:  .  Calcium Carbonate-Vitamin D (CALCIUM-VITAMIN D) 500-200 MG-UNIT per tablet, Take 1 tablet by mouth daily., Disp: , Rfl:  .  cephALEXin (KEFLEX)  500 MG capsule, Take 1 capsule (500 mg total) by mouth 3 (three) times daily., Disp: 30 capsule, Rfl: 0 .  cholecalciferol (VITAMIN D) 1000 UNITS tablet, Take 2,000 Units by mouth daily. , Disp: , Rfl:  .  citalopram (CELEXA) 20 MG tablet, Take 1 tablet (20 mg total) by mouth 2 (two) times daily., Disp: 180 tablet, Rfl: 3 .  cyclobenzaprine (FLEXERIL) 5 MG tablet, Take 1 tablet (5 mg total) by mouth 3 (three) times daily as needed for muscle spasms., Disp: 30 tablet, Rfl: 0 .  dicyclomine (BENTYL) 10 MG capsule, TAKE 1 CAPSULE BY MOUTH FOUR TIMES DAILY, BEFORE MEALS AND AT BEDTIME, Disp: 360 capsule, Rfl: 0 .  Dulaglutide (TRULICITY) [id number] [id number] SOPN, Inject 0.75 mg into the skin once a week., Disp: 12 pen, Rfl: 3 .  fluticasone (FLONASE) 50 MCG/ACT nasal spray, INSTILL 2 SPRAYS INTO EACH NOSTRIL EVERY  DAY, Disp: 16 mL, Rfl: 11 .  folic acid (FOLVITE) 1 MG tablet, TAKE 1 TABLET(1 MG) BY MOUTH DAILY (Patient taking differently: 2 mg. ), Disp: 90 tablet, Rfl: 0 .  furosemide (LASIX) 20 MG tablet, Take 1 tablet (20 mg total) by mouth daily., Disp: 90 tablet, Rfl: 3 .  glimepiride (AMARYL) 2 MG tablet, Take 1 tablet (2 mg total) by mouth daily with breakfast., Disp: 90 tablet, Rfl: 3 .  glucose blood (ONETOUCH ULTRA) test strip, Test BS daily Dx E11.9, Disp: 100 strip, Rfl: 3 .  levETIRAcetam (KEPPRA) 500 MG tablet, TAKE 1 TABLET BY MOUTH TWICE A DAY, Disp: 180 tablet, Rfl: 3 .  levothyroxine (SYNTHROID) 88 MCG tablet, Take 1 tablet (88 mcg total) by mouth daily before breakfast., Disp: 90 tablet, Rfl: 3 .  loperamide (IMODIUM) 2 MG capsule, Take by mouth as needed for diarrhea or loose stools (Take as directed). Reported on 05/17/2015, Disp: , Rfl:  .  metFORMIN (GLUCOPHAGE-XR) 500 MG 24 hr tablet, Take 1 tablet (500 mg total) by mouth daily with breakfast., Disp: 180 tablet, Rfl: 3 .  methotrexate (RHEUMATREX) 2.5 MG tablet, Take 4 tablets (10 mg total) by mouth once a week. Caution:Chemotherapy. Protect from light., Disp: 16 tablet, Rfl: 3 .  metoprolol tartrate (LOPRESSOR) 50 MG tablet, Take 1 tablet (50 mg total) by mouth 2 (two) times daily., Disp: 180 tablet, Rfl: 3 .  mirtazapine (REMERON) 15 MG tablet, Take 1 tablet (15 mg total) by mouth at bedtime., Disp: 90 tablet, Rfl: 3 .  Multiple Vitamins-Minerals (EYE VITAMINS PO), Take 2 tablets by mouth daily. AREDS formula, Disp: , Rfl:  .  NYSTATIN powder, APPLY TWICE DAILY UNDERNEATH BREASTS, Disp: 60 g, Rfl: 0 .  omeprazole (PRILOSEC) 40 MG capsule, Take 1 capsule (40 mg total) by mouth daily., Disp: 90 capsule, Rfl: 3 .  potassium chloride SA (KLOR-CON) 20 MEQ tablet, Take 2 tablets (40 mEq total) by mouth 2 (two) times daily., Disp: 360 tablet, Rfl: 1 .  Probiotic Product (PROBIOTIC PO), Take 1 capsule by mouth daily., Disp: , Rfl:  .   rivaroxaban (XARELTO) 20 MG TABS tablet, Take 1 tablet (20 mg total) by mouth daily., Disp: 90 tablet, Rfl: 3 .  simvastatin (ZOCOR) 40 MG tablet, Take 1 tablet (40 mg total) by mouth daily., Disp: 90 tablet, Rfl: 0 .  traMADol (ULTRAM) 50 MG tablet, Take 1 tablet (50 mg total) by mouth every 6 (six) hours as needed. Take 1/2 tab by mouth four times daily as needed for severe pain (Patient taking differently: Take 25 mg by mouth 4 (  four) times daily as needed. ), Disp: 90 tablet, Rfl: 1  Assessment/ Plan: 74 y.o. female   1. Suspected UTI Patient with recurrent urinary tract infections.  I have reemphasized that she is due for follow-up with Dr. Jeffie Pollock.  He recommended 24-month follow-up which would have been in April.  She has had several urinary tract infections this year.  I reviewed her last urine culture from March which showed cephalosporin sensitive E. coli.  However, her 2020 urine culture showed a multidrug-resistant Morganella.  I reinforced need for urine sample to make sure that we have her on the right antibiotics.  Her husband will bring this down to soon as possible. - cefdinir (OMNICEF) 300 MG capsule; Take 1 capsule (300 mg total) by mouth 2 (two) times daily. 1 po BID  Dispense: 20 capsule; Refill: 0 - Urinalysis, Complete; Future - Urine Culture; Future   Start time: 1:39pm End time: 1:51pm  Total time spent on patient care (including telephone call/ virtual visit): 18 minutes  [place], [place] [phone]

## 2019-07-23 ENCOUNTER — Ambulatory Visit (HOSPITAL_COMMUNITY): Payer: Self-pay | Admitting: Surgery

## 2019-07-23 DIAGNOSIS — Z889 Allergy status to unspecified drugs, medicaments and biological substances status: Secondary | ICD-10-CM

## 2019-07-23 NOTE — H&P (Signed)
[name] Appointment: 07/23/2019 4:00 PM Location: [facility] Patient #: [id number] DOB: 12/26/1945 Married / Language: Cleophus Molt / Race: White Female  History of Present Illness Adin Hector MD; 07/23/2019 5:33 PM) The patient is a 74 year old female who presents for an evaluation of a hernia. Note for "Hernia": Patient returns. Patient of [facility] [employer] rocking him family medicine. Used to be Dr. Redge Gainer, but he retired. Now Dr. Domenic Schwab  Reason for visit. Worsening abdominal hernia  Patient returns with her caregiver. Per his request, also had the visit with the patient's daughter on the phone. I have seen her in 2015 after she had a stroke to remove her PEG tube. She came back a little later with concerns for hernia. Recommended hold off on doing anything since she had dense hemiparesis and was still a process recovering of her stroke less than a year. Seemed to worsen the next year and she was more active. I offered surgery. They were hesitance. He came back a year later in 2019 with it being larger. I again recommended surgery. Cardiac clearance was done. She was cleared. There were hesitant to take her off anticoagulation more than 1 day, so is willing to just wait one day on the [facility]. Surgery scheduled. Patient got worried and canceled surgery.  The other issues that she has recurrent urinary tract infections. Followed by [facility]. Seems to have a poorly functioning bladder and has a lot of urinary incontinence. Wears diapers at night still struggles with moisture. Follow by Dr. Jeffie Pollock with urology. They tried numerous Antispas narcotic medications. Seemed to have her on Keflex daily as prophylaxis against recurrent UTIs. She is not on prophylactic antibiotics now. She's been getting recurrent UTIs. Patient is overdue for follow-up with urology. A lot of the seems to been delayed with a [place] pandemic. That seems to be waning they're entering  back into the health system to try and reevaluate things.   Patient is in a wheelchair today. Caretaker claims that she can walk with a cane and able to go out from time to time. Just not great exercise tolerance. She seemed to have some improvement with regular physical therapy rehabilitation. She is not getting more aggressive rehabilitation, she's return to being more sedentary & less motivated. Patient has not been readmitted since I saw her. No major issues from a neurologic or cardiac standpoint.    PRIOR NOTE 2018 Pleasant woman comes today with her husband. a history of paroxysmal atrial fibrillation, symptomatic tachycardia bradycardia syndrome, status post permanent pacemaker insertion. She was treated with multiple antiarrhythmic drugs and has over the years had worsening atrial fibrillation. She underwent atrial fib ablation but continued to have some symptomatic atrial fib and undewent convergence ablation procedure at [facility] and suffered a major stroke resulting in left HP and dense expressive aphasia Fully anticoagulated on Xerelto. Has dense right hemiparesis as well as difficulty with speech.   History of prior sternotomy and attempted correction of fibrillation. They noticed a lump above her bellybutton. I saw her a couple times in the past few years to discuss hernia repair. The first time I saw her she had a small asymptomatic reducible hernia. I did not strongly recommend surgery at this time. Patient wished to hold off on any hernia repair. However, the hernia has gotten larger. More strongly recommended hernia surgery last year. Husband confesses he saw commercials about mesh and they were scared about proceeding. However the patient notes that she's had more pain  and discomfort. He notes she is more sensitive and it now. It is bothering her more. She wish to reconsider surgery. Primary care physician more strongly recommended that. They return a third time to  reconsider hernia repair surgery.   Does have chronic constipation. No severe nausea or vomiting. Had hysterectomy & PEG but no other intra-abdominal surgeries. She usually has a sling for her right upper extremity and walks with a cane. Her expressive aphasia is a little better and she is stronger. Certainly no new events. She still is followed by cardiology closely, primarily Dr. Lovena Le and Dr. Rayann Heman. She is fully anticoagulated. She's having worsening discomfort in her abdomen.   Problem List/Past Medical Adin Hector, MD; 07/23/2019 5:02 PM) PEG (PERCUTANEOUS ENDOSCOPIC GASTROSTOMY) ADJUSTMENT/REPLACEMENT/REMOVAL (Z43.1) EPIGASTRIC HERNIA (K43.9) INCISIONAL HERNIA, WITHOUT OBSTRUCTION OR GANGRENE (K43.2) PREOP - [place] - ENCOUNTER FOR PREOPERATIVE EXAMINATION FOR GENERAL SURGICAL PROCEDURE [id number])  Past Surgical History Lars Mage Spillers, CMA; 07/23/2019 4:24 PM) Cataract Surgery Bilateral. Hysterectomy (not due to cancer) - Partial  Diagnostic Studies History (Alisha Spillers, CMA; 07/23/2019 4:24 PM) Colonoscopy 5-10 years ago within last year Mammogram 1-3 years ago Pap Smear >5 years ago 1-5 years ago  Allergies Adin Hector, MD; 07/23/2019 5:02 PM) Latex Exam Gloves *MEDICAL DEVICES* Allergies Reconciled Caudal Tray *LOCAL ANESTHETICS-Parenteral* Ciprofloxacin *CHEMICALS* Codeine Sulfate *ANALGESICS - OPIOID* Diovan *ANTIHYPERTENSIVES* Doxycycline *DERMATOLOGICALS* Esomeprazole Magnesium *ULCER DRUGS* Levofloxacin *CHEMICALS* Neomycin-Bacitracin Zn-Polymyx *OPHTHALMIC AGENTS* Penicillin G Benzathine & Proc *PENICILLINS* Sulfacetamide *CHEMICALS* Verapamil HCl *CALCIUM CHANNEL BLOCKERS* Vimovo *ANALGESICS - ANTI-INFLAMMATORY* Zocor *ANTIHYPERLIPIDEMICS*  Medication History (Alisha Spillers, CMA; 07/23/2019 4:32 PM) Cyclobenzaprine HCl (5MG  Tablet, Oral) Active. Folic Acid (1MG  Tablet, Oral) Active. Furosemide (20MG  Tablet, Oral)  Active. Levothyroxine Sodium (88MCG Tablet, Oral) Active. Medications Reconciled  Social History Illene Regulus, CMA; 07/23/2019 4:25 PM) Caffeine use Carbonated beverages, Coffee, Tea. No drug use Tobacco use Former smoker, Never smoker.  Family History Illene Regulus, CMA; 07/23/2019 4:24 PM) Alcohol Abuse Brother. Arthritis Mother. Cervical Cancer Sister. Diabetes Mellitus Mother, Sister. Heart Disease Brother, Mother, Father. Hypertension Brother, Father, Mother. Ovarian Cancer Sister.  Pregnancy / Birth History Illene Regulus, CMA; 07/23/2019 4:25 PM) Age at menarche 58 years, 74 years. Age of menopause 51-55 Contraceptive History Oral contraceptives. Gravida 2 Maternal age 48-20 Para 2 Regular periods  Other Problems Adin Hector, MD; 07/23/2019 5:02 PM) Anxiety Disorder Arthritis Atrial Fibrillation Back Pain Bladder Problems Cerebrovascular Accident Depression Diabetes Mellitus Diverticulosis Gastroesophageal Reflux Disease General anesthesia - complications High blood pressure Hypercholesterolemia Inguinal Hernia Seizure Disorder Thyroid Disease    Vitals (Alisha Spillers CMA; 07/23/2019 4:25 PM) 07/23/2019 4:25 PM Weight: 165 lb Height: 64in Body Surface Area: 1.8 m Body Mass Index: 28.32 kg/m  Pulse: 81 (Regular)  BP: 132/78(Sitting, Left Arm, Standard)        Physical Exam Adin Hector MD; 07/23/2019 5:25 PM)  General Mental Status-Alert. General Appearance-Not in acute distress, Not Sickly. Orientation-Oriented X3. Hydration-Well hydrated. Voice-Normal. Note: Sitting in wheelchair bright. Seems more bright and alert. Actually answering some questions a more interactive. Seems better than usual  Integumentary Global Assessment Upon inspection and palpation of skin surfaces of the - Axillae: non-tender, no inflammation or ulceration, no drainage. and Distribution of scalp and  body hair is normal. General Characteristics Temperature - normal warmth is noted.  Head and Neck Head-normocephalic, atraumatic with no lesions or palpable masses. Face Global Assessment - atraumatic, no pallor observed. Neck Global Assessment - no abnormal movements, no bruit auscultated on the right, no bruit auscultated on  the left, no decreased range of motion, non-tender. Trachea-midline. Thyroid Gland Characteristics - non-tender.  Eye Eyeball - Left-Extraocular movements intact, No Nystagmus - Left. Eyeball - Right-Extraocular movements intact, No Nystagmus - Right. Cornea - Left-No Hazy - Left. Cornea - Right-No Hazy - Right. Sclera/Conjunctiva - Left-No scleral icterus, No Discharge - Left. Sclera/Conjunctiva - Right-No scleral icterus, No Discharge - Right. Pupil - Left-Direct reaction to light normal. Pupil - Right-Direct reaction to light normal.  ENMT Ears Pinna - Left - no drainage observed, no generalized tenderness observed. Pinna - Right - no drainage observed, no generalized tenderness observed. Nose and Sinuses External Inspection of the Nose - no destructive lesion observed. Inspection of the nares - Left - quiet respiration. Inspection of the nares - Right - quiet respiration. Mouth and Throat Lips - Upper Lip - no fissures observed, no pallor noted. Lower Lip - no fissures observed, no pallor noted. Nasopharynx - no discharge present. Oral Cavity/Oropharynx - Tongue - no dryness observed. Oral Mucosa - no cyanosis observed. Hypopharynx - no evidence of airway distress observed.  Chest and Lung Exam Inspection Movements - Normal and Symmetrical. Accessory muscles - No use of accessory muscles in breathing. Palpation Palpation of the chest reveals - Non-tender. Auscultation Breath sounds - Normal and Clear.  Cardiovascular Auscultation Rhythm - Regular. Murmurs & Other Heart Sounds - Auscultation of the heart reveals - No Murmurs and  No Systolic Clicks.  Abdomen Inspection Inspection of the abdomen reveals - No Visible peristalsis and No Abnormal pulsations. Umbilicus - No Bleeding, No Urine drainage. Palpation/Percussion Palpation and Percussion of the abdomen reveal - Soft, Non Tender, No Rebound tenderness, No Rigidity (guarding) and No Cutaneous hyperesthesia. Note: Overweight but soft. 9x7 cm supraumbilical mass soft but sensitive. Seems partially reducible but she does not want me to try to fully reduce.   Scar at former LEFT upper quadrant dimpled PEG site.  Female Genitourinary Sexual Maturity Tanner 5 - Adult hair pattern. Note: No vaginal bleeding nor discharge. No inguinal hernias  Peripheral Vascular Upper Extremity Inspection - Left - No Cyanotic nailbeds - Left, Not Ischemic. Inspection - Right - No Cyanotic nailbeds - Right, Not Ischemic.  Neurologic Neurologic evaluation reveals -normal attention span and ability to concentrate, able to name objects and repeat phrases. Appropriate fund of knowledge , normal sensation and normal coordination. Mental Status Affect - not angry, not paranoid. Cranial Nerves-Normal Bilaterally. Gait-Normal. Note: Comprehends well but has difficulty finding words to speak. RIGHT hemiparesis. Can passively extend at the elbow. Moves LEFT upper extremity relatively well. Can slowly transfer up to the bed.  Neuropsychiatric Mental status exam performed with findings of-thought content normal with ability to perform basic computations and apply abstract reasoning and no evidence of hallucinations, delusions, obsessions or homicidal/suicidal ideation. Speech-slow and stuttering. Note: Seems more calm and less anxious today. Again slow to answer but actually more responsive getting better phrased answers today.  Musculoskeletal Global Assessment Spine, Ribs and Pelvis - no instability, subluxation or laxity. Right Upper Extremity - no instability,  subluxation or laxity.  Lymphatic Head & Neck General Head & Neck Lymphatics: Bilateral - Description - No Localized lymphadenopathy. Axillary General Axillary Region: Bilateral - Description - No Localized lymphadenopathy. Femoral & Inguinal Generalized Femoral & Inguinal Lymphatics: Left - Description - No Localized lymphadenopathy. Right - Description - No Localized lymphadenopathy.    Assessment & Plan Adin Hector MD; 07/23/2019 5:46 PM)  INCISIONAL HERNIA, WITHOUT OBSTRUCTION OR GANGRENE (K43.2) Impression: Larger & more sensitive  supraumbilical incisional hernia.  Because it is larger, irreducible, and more sensitive; I'm still inclined to repair it. Laparoscopic underlay repair with mesh. I would use mesh given her chronic constipation, obesity, and straining.  With her chronic anticoagulation for her embolic stroke and neurological issues, her operative risks are increased. Her primary care physician feels comfortable proceeding with surgery. She is cleared 3 years ago, but would like to double check with cardiology to make sure they feel it is safe to come off the [facility] perioperatively. Usually wait 2 days preop and then resume postop day 1. 3 years ago someone had recommended holding Xerelto for only 1 day If they have hard contraindications, hold off on surgery. However is a little worse each time I see her, and I'm worried delaying further will make it more complicated. I had a long discussion with the patient and her caregiver as well as the patient's daughter and the phone. I tried to tackle the concerns of friends and family and or stories of other emergency surgeries. I noted on trying to be best to minimize his risks.  The other big problem is her recurrent UTIs. She is overdue to see urology. I noted that they needed to see urology and see if her UTIs can be better controlled to minimize risk of wound infection and possible mesh infection with hernia repair. That would  have to use Phasix mesh which is less optimal but it may give a chance of a better repair than just stitches only which has a high failure rate. Caregiver wanted to talk about that at length with concerns of incontinence, soiling, consider a urine suction wick at bedtime, and consider resuming antibiotic prophylaxis.   PREOP - [place] - ENCOUNTER FOR PREOPERATIVE EXAMINATION FOR GENERAL SURGICAL PROCEDURE (Z01.818)  Current Plans You are being scheduled for surgery- Our schedulers will call you.  You should hear from our office's scheduling department within 5 working days about the location, date, and time of surgery. We try to make accommodations for patient's preferences in scheduling surgery, but sometimes the OR schedule or the surgeon's schedule prevents [place] from making those accommodations.  If you have not heard from our office [phone]) in 5 working days, call the office and ask for your surgeon's nurse.  If you have other questions about your diagnosis, plan, or surgery, call the office and ask for your surgeon's nurse.  Written instructions provided [facility] Consent - Hernia Repair - Ventral/Incisional/Umbilical (Yoshi Vicencio): discussed with patient and provided information. Pt Education - Pamphlet Given - Laparoscopic Hernia Repair: discussed with patient and provided information. Pt Education - [facility] Hernia Post-Op HCI (Nyriah Coote): discussed with patient and provided information.  RECURRENT UTI (URINARY TRACT INFECTION) (N39.0) Impression: She is overdue to see urology. I strongly recommend that she see Dr. Jeffie Pollock with Urology to help sort out/ troubleshoot and minimize this before considering hernia repair   HEMIPARESIS, RIGHT (G81.91)   PAF (PAROXYSMAL ATRIAL FIBRILLATION) (I48.0)   ANTICOAGULATED (Z79.01)  Current Plans I recommended obtaining preoperative cardiac clearance. I am concerned about the health of the patient and the ability to tolerate the operation. Therefore, we will  request clearance by cardiology to better assess operative risk & see if a reevaluation, further workup, etc is needed. Also recommendations on how medications such as for anticoagulation and blood pressure should be managed/held/restarted after surgery. Pt Education - [facility] Hold anticoagulation preoperatively  MULTIPLE DRUG ALLERGIES (Z88.9)  Adin Hector, MD, FACS, MASCRS Gastrointestinal and Minimally Invasive Surgery  [facility] [address], [place], [place] [address] [phone] Fax [phone] Main/Paging  CONTACT INFORMATION: Weekday (9AM-5PM) concerns: Call [facility] main office at [phone] Weeknight (5PM-9AM) or Weekend/Holiday concerns: Check [email or web address] for General Surgery [facility] coverage (Please, do not use SecureChat as it is not reliable communication to operating surgeons for immediate patient care)

## 2019-07-25 ENCOUNTER — Telehealth: Payer: Self-pay

## 2019-07-25 LAB — URINE CULTURE

## 2019-07-25 NOTE — Telephone Encounter (Signed)
Clinical pharmacist to review prior to next office visit

## 2019-07-25 NOTE — Telephone Encounter (Signed)
   [name] Pre-operative Risk Assessment    [name] STAFF: - Please ensure there is not already an duplicate clearance open for this procedure. - Under Visit Info/Reason for Call, type in Other and utilize the format Clearance MM/DD/YY or Clearance TBD. Do not use dashes or single digits. - If request is for dental extraction, please clarify the # of teeth to be extracted.  Request for surgical clearance:  1. What type of surgery is being performed? LAP. VENTRAL HERNIA REPAIR W/MESH   2. When is this surgery scheduled? TBD   3. What type of clearance is required (medical clearance vs. Pharmacy clearance to hold med vs. Both)? BOTH  4. Are there any medications that need to be held prior to surgery and how long? XARELTO PLEASE ADVISE   5. Practice name and name of physician performing surgery? [facility], DR. Remo Lipps GROSS   6. What is the office phone number? [phone]   7.   What is the office fax number? [place], CMA  8.   Anesthesia type (None, local, MAC, general) ? GENERAL    [name] 07/25/2019, 5:10 PM  _________________________________________________________________   (provider comments below)

## 2019-07-25 NOTE — Telephone Encounter (Signed)
   Primary Cardiologist: Dr. Lovena Le  Chart reviewed as part of pre-operative protocol coverage. Because of [name]'s past medical history and time since last visit, they will require a follow-up visit in order to better assess preoperative cardiovascular risk.  Pre-op covering staff: - Please schedule appointment and call patient to inform them. If patient already had an upcoming appointment within acceptable timeframe, please add "pre-op clearance" to the appointment notes so provider is aware. - Please contact requesting surgeon's office via preferred method (i.e, phone, fax) to inform them of need for appointment prior to surgery.  If applicable, this message will also be routed to pharmacy pool and/or primary cardiologist for input on holding anticoagulant/antiplatelet agent as requested below so that this information is available to the clearing provider at time of patient's appointment.   [place], [place]  07/25/2019, 6:28 PM

## 2019-07-27 ENCOUNTER — Other Ambulatory Visit: Payer: Self-pay | Admitting: Family Medicine

## 2019-07-28 NOTE — Telephone Encounter (Signed)
   Primary Cardiologist:No primary care provider on file.  Chart reviewed as part of pre-operative protocol coverage. Patient has not been seen since 06/2018 and this was a virtual visit. Therefore, patient  will require a follow-up visit in order to better assess preoperative cardiovascular risk. This visit should be an in-office visit not a virtual visit as we do not have an updated EKG on her since 08/2017.  Pre-op covering staff: - Please schedule appointment and call patient to inform them. If patient already had an upcoming appointment within acceptable timeframe, please add "pre-op clearance" to the appointment notes so provider is aware. - Please contact requesting surgeon's office via preferred method (i.e, phone, fax) to inform them of need for appointment prior to surgery.   Darreld Mclean, PA-C  07/28/2019, 9:52 AM

## 2019-07-28 NOTE — Telephone Encounter (Signed)
Spoke with Dr. Rayann Heman. Due to patients history of stroke, he recommends holding Xarelto only 1 day prior to the procedure. Resume anticoagulation as soon as clinically able.

## 2019-07-28 NOTE — Telephone Encounter (Signed)
Patient with diagnosis of Afib on Xarelto for anticoagulation.    Procedure: LAP VENTRAL HERNIA REPAIR W/MESH  Date of procedure: TBD  CHADS2-VASc score of 6 (HTN, AGE, DM2, stroke/tia x 2, female)  CrCl 92 mL/min Platelet count 198  Patient is at high risk d/t previous stroke; defer decision to MD for anticoagulation hold time.   Kennon Holter, PharmD PGY1 Ambulatory Care Pharmacy Resident

## 2019-07-28 NOTE — Telephone Encounter (Signed)
I called and spoke with patients husband [name], patient has appointment scheduled on 09/04/19 at 3:00PM with Cristopher Peru.

## 2019-07-30 ENCOUNTER — Ambulatory Visit (INDEPENDENT_AMBULATORY_CARE_PROVIDER_SITE_OTHER): Payer: Medicare Other | Admitting: Pharmacist

## 2019-07-30 ENCOUNTER — Other Ambulatory Visit: Payer: Self-pay

## 2019-07-30 DIAGNOSIS — E1159 Type 2 diabetes mellitus with other circulatory complications: Secondary | ICD-10-CM

## 2019-07-30 NOTE — Progress Notes (Signed)
    07/30/2019 Name: [name] MRN: [id number] DOB: Sep 18, 1945   S:  84 yoF Presents for diabetes evaluation, education, and management  Insurance coverage/medication affordability: medicare  Patient reports adherence with medications. . Current diabetes medications include: glimepiride, metformin, trulicity . Current hypertension medications include: metoprolol Goal 130/80 . Current hyperlipidemia medications include: simvastatin   Patient reports hypoglycemic events.   Patient reported dietary habits: Eats 2-3 meals/day Patient is struggling to eat and does not have a great appetite  Trying to incorporate Boost shakes  Patient-reported exercise habits:  N/a, s/p stroke  Patient denies nocturia (nighttime urination).  Patient denies neuropathy (nerve pain).  Patient denies visual changes.  Patient denies self foot exams.    O:  Lab Results  Component Value Date   HGBA1C 5.6 08/06/2019      Lipid Panel     Component Value Date/Time   CHOL 74 (L) 08/06/2019 1421   CHOL 102 06/27/2012 1003   TRIG 108 08/06/2019 1421   TRIG 132 03/13/2013 1056   TRIG 146 06/27/2012 1003   TRIG 152 04/30/2008 0000   HDL 26 (L) 08/06/2019 1421   HDL 37 (L) 03/13/2013 1056   HDL 39 (L) 06/27/2012 1003   CHOLHDL 2.8 08/06/2019 1421   LDLCALC 28 08/06/2019 1421   LDLCALC 30 03/13/2013 1056   LDLCALC 34 06/27/2012 1003   Home fasting blood sugars: 90-110 Reports lows in the 70s  2 hour post-meal/random blood sugars: n/a.    A/P:  Diabetes T2DM currently having hypoglycemia. Patient's spouse is able to verbalize appropriate hypoglycemia management plan. Patient is adherent with medication.    DISCONTINUE glimepiride--called [facility] to stop   CONTINUE Trulicty 0.75mg  sq weekly   CONTINUE metformin   Extensively discussed pathophysiology of diabetes, recommended lifestyle interventions, dietary effects on blood sugar control   Counseled on s/sx of and management of  hypoglycemia    Written patient instructions provided.  Total time in face to face counseling 30 minutes.   Follow up PCP Clinic Visit ON 08/06/19  Regina Eck, PharmD, BCPS Clinical Pharmacist, [place]  [facility] Phone [phone]

## 2019-07-31 ENCOUNTER — Telehealth: Payer: Self-pay | Admitting: Family Medicine

## 2019-07-31 ENCOUNTER — Other Ambulatory Visit: Payer: Self-pay | Admitting: *Deleted

## 2019-07-31 MED ORDER — ONETOUCH ULTRA VI STRP: ORAL_STRIP | 3 refills | Status: AC

## 2019-08-06 ENCOUNTER — Ambulatory Visit (INDEPENDENT_AMBULATORY_CARE_PROVIDER_SITE_OTHER): Payer: Medicare Other | Admitting: Family Medicine

## 2019-08-06 ENCOUNTER — Other Ambulatory Visit: Payer: Self-pay

## 2019-08-06 ENCOUNTER — Encounter: Payer: Self-pay | Admitting: Family Medicine

## 2019-08-06 VITALS — BP 135/74 | HR 88 | Temp 97.8°F | Ht 64.0 in | Wt 150.0 lb

## 2019-08-06 DIAGNOSIS — E034 Atrophy of thyroid (acquired): Secondary | ICD-10-CM

## 2019-08-06 DIAGNOSIS — I480 Paroxysmal atrial fibrillation: Secondary | ICD-10-CM

## 2019-08-06 DIAGNOSIS — I6930 Unspecified sequelae of cerebral infarction: Secondary | ICD-10-CM | POA: Diagnosis not present

## 2019-08-06 DIAGNOSIS — E1159 Type 2 diabetes mellitus with other circulatory complications: Secondary | ICD-10-CM

## 2019-08-06 DIAGNOSIS — I10 Essential (primary) hypertension: Secondary | ICD-10-CM

## 2019-08-06 LAB — BAYER DCA HB A1C WAIVED: HB A1C (BAYER DCA - WAIVED): 5.6 % (ref <7.0)

## 2019-08-06 NOTE — Progress Notes (Signed)
BP 135/74   Pulse 88   Temp 97.8 F (36.6 C)   Ht '5\' 4"'$  (1.626 m)   Wt 150 lb (68 kg)   SpO2 95%   BMI 25.75 kg/m    Subjective:   Patient ID: [name], female    DOB: [birth year], 74 y.o.   MRN: [id number]  HPI: [name] is a 74 y.o. female presenting on 08/06/2019 for Medical Management of Chronic Issues, Diabetes, and Cerebrovascular Accident   HPI Type 2 diabetes mellitus Patient comes in today for recheck of his diabetes. Patient has been currently taking Trulicity and Metformin, A1c is 5.6, has some hypoglycemic episodes in the 70s and is coming in complaining of diarrhea and weight loss and decreased appetite that has been increasing over the past couple months.  She is also developed a hemorrhoid and bright red blood in stool because of the diarrhea.. Patient is not currently on an ACE inhibitor/ARB. Patient has not seen an ophthalmologist this year. Patient denies any issues with their feet. The symptom started onset as an adult hypertension and cholesterol ARE RELATED TO DM   Hypertension Patient is currently on furosemide and metoprolol, and their blood pressure today is 135/74. Patient denies any lightheadedness or dizziness. Patient denies headaches, blurred vision, chest pains, shortness of breath, or weakness. Denies any side effects from medication and is content with current medication.   Hyperlipidemia Patient is coming in for recheck of his hyperlipidemia. The patient is currently taking simvastatin. They deny any issues with myalgias or history of liver damage from it. They deny any focal numbness or weakness or chest pain.   Hypothyroidism recheck Patient is coming in for thyroid recheck today as well. They deny any issues with hair changes or heat or cold problems or diarrhea or constipation. They deny any chest pain or palpitations. They are currently on levothyroxine 88 micrograms   Relevant past medical, surgical, family and social history reviewed and  updated as indicated. Interim medical history since our last visit reviewed. Allergies and medications reviewed and updated.  Review of Systems  Constitutional: Negative for chills and fever.  Eyes: Negative for visual disturbance.  Respiratory: Negative for chest tightness and shortness of breath.   Cardiovascular: Negative for chest pain and leg swelling.  Gastrointestinal: Positive for abdominal pain, diarrhea, nausea and vomiting.  Musculoskeletal: Negative for back pain and gait problem.  Skin: Negative for rash.  Neurological: Negative for light-headedness and headaches.  Psychiatric/Behavioral: Negative for agitation and behavioral problems.  All other systems reviewed and are negative.   Per HPI unless specifically indicated above   Allergies as of 08/06/2019      Reactions   Latex Rash   Caudal Tray [lidocaine-epinephrine]    Edema    Codeine Nausea Only   Diovan [valsartan] Itching   Doxycycline Other (See Comments)   Might have caused heart to race per pt - not sure   Esomeprazole Magnesium    Headache    Levofloxacin    Insomnia    Myrbetriq [mirabegron] Diarrhea   Neomycin-bacitracin Zn-polymyx    rash   Penicillins    hives   Sulfa Drugs Cross Reactors Other (See Comments)   Pt not sure what reaction was   Verapamil    Edema   Vimovo [naproxen-esomeprazole]    Upset stomach      Medication List       Accurate as of August 06, 2019  2:40 PM. If you have any questions, ask  your nurse or doctor.        STOP taking these medications   cefdinir 300 MG capsule Commonly known as: OMNICEF Stopped by: Fransisca Kaufmann Analyce Tavares, MD   glimepiride 2 MG tablet Commonly known as: AMARYL Stopped by: Fransisca Kaufmann Gayl Ivanoff, MD     TAKE these medications   acetaminophen 500 MG tablet Commonly known as: TYLENOL Take 500 mg by mouth every 6 (six) hours as needed (pain).   calcium-vitamin D 500-200 MG-UNIT tablet Take 1 tablet by mouth daily.   cholecalciferol 1000  units tablet Commonly known as: VITAMIN D Take 2,000 Units by mouth daily.   citalopram 20 MG tablet Commonly known as: CELEXA Take 1 tablet (20 mg total) by mouth 2 (two) times daily.   cyclobenzaprine 5 MG tablet Commonly known as: FLEXERIL Take 1 tablet (5 mg total) by mouth 3 (three) times daily as needed for muscle spasms.   dicyclomine 10 MG capsule Commonly known as: BENTYL TAKE 1 CAPSULE BY MOUTH FOUR TIMES DAILY, BEFORE MEALS AND AT BEDTIME   EYE VITAMINS PO Take 2 tablets by mouth daily. AREDS formula   fluticasone 50 MCG/ACT nasal spray Commonly known as: FLONASE INSTILL 2 SPRAYS INTO EACH NOSTRIL EVERY DAY   folic acid 1 MG tablet Commonly known as: FOLVITE TAKE 1 TABLET(1 MG) BY MOUTH DAILY What changed: See the new instructions.   furosemide 20 MG tablet Commonly known as: LASIX Take 1 tablet (20 mg total) by mouth daily.   levETIRAcetam 500 MG tablet Commonly known as: KEPPRA TAKE 1 TABLET BY MOUTH TWICE A DAY   levothyroxine 88 MCG tablet Commonly known as: SYNTHROID Take 1 tablet (88 mcg total) by mouth daily before breakfast.   loperamide 2 MG capsule Commonly known as: IMODIUM Take by mouth as needed for diarrhea or loose stools (Take as directed). Reported on 05/17/2015   metFORMIN 500 MG 24 hr tablet Commonly known as: GLUCOPHAGE-XR TAKE 2 TABLETS (1,'000MG'$ ) BY MOUTH TWICE A DAY AFTER A MEAL   methotrexate 2.5 MG tablet Commonly known as: RHEUMATREX Take 4 tablets (10 mg total) by mouth once a week. Caution:Chemotherapy. Protect from light.   metoprolol tartrate 50 MG tablet Commonly known as: LOPRESSOR Take 1 tablet (50 mg total) by mouth 2 (two) times daily.   mirtazapine 15 MG tablet Commonly known as: REMERON Take 1 tablet (15 mg total) by mouth at bedtime.   nystatin powder Generic drug: nystatin APPLY TWICE DAILY UNDERNEATH BREASTS   omeprazole 40 MG capsule Commonly known as: PRILOSEC Take 1 capsule (40 mg total) by mouth  daily.   OneTouch Ultra test strip Generic drug: glucose blood Test BS daily Dx E11.9   potassium chloride SA 20 MEQ tablet Commonly known as: KLOR-CON Take 2 tablets (40 mEq total) by mouth 2 (two) times daily.   PROBIOTIC PO Take 1 capsule by mouth daily.   rivaroxaban 20 MG Tabs tablet Commonly known as: Xarelto Take 1 tablet (20 mg total) by mouth daily.   simvastatin 40 MG tablet Commonly known as: ZOCOR Take 1 tablet (40 mg total) by mouth daily.   traMADol 50 MG tablet Commonly known as: ULTRAM Take 1 tablet (50 mg total) by mouth every 6 (six) hours as needed. Take 1/2 tab by mouth four times daily as needed for severe pain What changed:   how much to take  when to take this  additional instructions   Trulicity 2.29 NL/8.9QJ Sopn Generic drug: Dulaglutide Inject 0.75 mg into the skin once  a week.   VITAMIN C PO Take 1 tablet by mouth daily.        Objective:   BP 135/74   Pulse 88   Temp 97.8 F (36.6 C)   Ht '5\' 4"'$  (1.626 m)   Wt 150 lb (68 kg)   SpO2 95%   BMI 25.75 kg/m   Wt Readings from Last 3 Encounters:  08/06/19 150 lb (68 kg)  05/01/19 174 lb (78.9 kg)  03/18/19 176 lb (79.8 kg)    Physical Exam Vitals and nursing note reviewed.  Constitutional:      General: She is not in acute distress.    Appearance: She is well-developed. She is not diaphoretic.  Eyes:     Conjunctiva/sclera: Conjunctivae normal.     Pupils: Pupils are equal, round, and reactive to light.  Cardiovascular:     Rate and Rhythm: Normal rate and regular rhythm.     Heart sounds: Normal heart sounds. No murmur heard.   Pulmonary:     Effort: Pulmonary effort is normal. No respiratory distress.     Breath sounds: Normal breath sounds. No wheezing.  Musculoskeletal:        General: No tenderness. Normal range of motion.  Skin:    General: Skin is warm and dry.     Findings: No rash.  Neurological:     Mental Status: She is alert and oriented to person,  place, and time.     Coordination: Coordination normal.  Psychiatric:        Behavior: Behavior normal.     A1c is 5.6  Assessment & Plan:   Problem List Items Addressed This Visit      Cardiovascular and Mediastinum   HTN (hypertension)   Relevant Orders   TSH   PAF (paroxysmal atrial fibrillation) (HCC)   Relevant Orders   Lipid panel     Endocrine   Hypothyroidism   Relevant Orders   TSH   Type 2 diabetes mellitus ([place]) - Primary   Relevant Orders   Bayer DCA Hb A1c Waived   CBC with Differential/Platelet   CMP14+EGFR   Microalbumin / creatinine urine ratio     Other   Late effects of cerebral ischemic stroke   Relevant Orders   CBC with Differential/Platelet      With the patient's abdominal symptoms and weight loss were going to stop the Trulicity and see how she does just on the Metformin, A1c 5.6. Follow up plan: Return in about 3 months (around 11/06/2019), or if symptoms worsen or fail to improve, for Diabetes and thyroid recheck.  Counseling provided for all of the vaccine components Orders Placed This Encounter  Procedures  . Bayer [facility] Hb A1c [place], MD [facility] 08/06/2019, 2:40 PM

## 2019-08-07 LAB — CBC WITH DIFFERENTIAL/PLATELET
Basophils Absolute: 0.1 10*3/uL (ref 0.0–0.2)
Basos: 1 %
EOS (ABSOLUTE): 0.3 10*3/uL (ref 0.0–0.4)
Eos: 5 %
Hematocrit: 40 % (ref 34.0–46.6)
Hemoglobin: 13.3 g/dL (ref 11.1–15.9)
Immature Grans (Abs): 0 10*3/uL (ref 0.0–0.1)
Immature Granulocytes: 0 %
Lymphocytes Absolute: 2.6 10*3/uL (ref 0.7–3.1)
Lymphs: 35 %
MCH: 31.7 pg (ref 26.6–33.0)
MCHC: 33.3 g/dL (ref 31.5–35.7)
MCV: 95 fL (ref 79–97)
Monocytes Absolute: 0.7 10*3/uL (ref 0.1–0.9)
Monocytes: 10 %
Neutrophils Absolute: 3.7 10*3/uL (ref 1.4–7.0)
Neutrophils: 49 %
Platelets: 207 10*3/uL (ref 150–450)
RBC: 4.2 x10E6/uL (ref 3.77–5.28)
RDW: 15.8 % — ABNORMAL HIGH (ref 11.7–15.4)
WBC: 7.4 10*3/uL (ref 3.4–10.8)

## 2019-08-07 LAB — TSH: TSH: 1.7 u[IU]/mL (ref 0.450–4.500)

## 2019-08-07 LAB — LIPID PANEL
Chol/HDL Ratio: 2.8 ratio (ref 0.0–4.4)
Cholesterol, Total: 74 mg/dL — ABNORMAL LOW (ref 100–199)
HDL: 26 mg/dL — ABNORMAL LOW (ref >39)
LDL Chol Calc (NIH): 28 mg/dL (ref 0–99)
Triglycerides: 108 mg/dL (ref 0–149)
VLDL Cholesterol Cal: 20 mg/dL (ref 5–40)

## 2019-08-07 LAB — CMP14+EGFR
ALT: 17 IU/L (ref 0–32)
AST: 36 IU/L (ref 0–40)
Albumin/Globulin Ratio: 1.7 (ref 1.2–2.2)
Albumin: 4 g/dL (ref 3.7–4.7)
Alkaline Phosphatase: 67 IU/L (ref 48–121)
BUN/Creatinine Ratio: 9 — ABNORMAL LOW (ref 12–28)
BUN: 5 mg/dL — ABNORMAL LOW (ref 8–27)
Bilirubin Total: 1.1 mg/dL (ref 0.0–1.2)
CO2: 22 mmol/L (ref 20–29)
Calcium: 9.7 mg/dL (ref 8.7–10.3)
Chloride: 103 mmol/L (ref 96–106)
Creatinine, Ser: 0.53 mg/dL — ABNORMAL LOW (ref 0.57–1.00)
GFR calc Af Amer: 110 mL/min/{1.73_m2} (ref >59)
GFR calc non Af Amer: 96 mL/min/{1.73_m2} (ref >59)
Globulin, Total: 2.3 g/dL (ref 1.5–4.5)
Glucose: 107 mg/dL — ABNORMAL HIGH (ref 65–99)
Potassium: 4.4 mmol/L (ref 3.5–5.2)
Sodium: 142 mmol/L (ref 134–144)
Total Protein: 6.3 g/dL (ref 6.0–8.5)

## 2019-08-12 NOTE — Telephone Encounter (Signed)
See other telephone encounter.

## 2019-08-14 NOTE — Progress Notes (Signed)
Subjective:  1. Urge incontinence   2. Chronic cystitis      CC: Recurrent UTI   08/15/19:  Taralee returns today in f/u.  She is off of the keflex suppression but was having breakthroughs.  She has seen Dr. Johney Maine again and is interested in repair of the ventral hernia and he wanted to make sure we saw her.  She was treated for a UTI in December in cipro and then again in 05/26/19 with keflex.    She had a culture on 07/22/19 and had e. Coli that was sensitive to all but ampicillin.  She was treated with Cefdinir for 10 days.  She had diarrhea with that.   But the UA looks infected again today.   She has some dysuria.    She has marked UUI and didn't tolerate Myrbetriq because of diarrhea.  Her Cr was 0.53 on 08/03/19 and she has normal LFT's.   11/22/18: Chandra returns today in f/u for her history of recurrent infection. She remains on keflex for suppression. She is seeing Dr. Windell Moment and she was given Cipro in June and August for dysuria and she had a good response. They held the keflex while on the Cipro. The last culture I see was morganella in 4/20. She was recommended to take Cranberry tablets. She is doing well without dysuria or frequency today. She has had no hematuria. She still has some incontinence and using pads in the day. She has some enuresis. She had a normal renal US in 1/20. Her Cr was 0.62 in 6/20.   Hx: Jakyia returns today in f/u. She has a history of a NGB following a stroke on 04/16/13 and has UUI and a history of recurrent UTI's. We discussed the Purewick at her last visit.  she had a prolonged period of foley drainage after the stroke. She has had Mx UTI's over the couple of years. She has had Klebsiella, E. coli and Proteus on her cultures over the past year. She will have dysuria and lethargy with the infections. She will have no hematuria or fever. She is not on any meds for her OAB and has UUI and is able to get to the bathroom most of the time during the day but will wet her depends  overnight. She failed Myrbetriq and more recently Toviaz and oxybutynin. She was found to have a 178ml bladder with instability and UUI on urodynamics. She remains severely aphasic. She was on Cefdinir in August and Nitrofurantoin in late September and has been on keflex for suppression since her visit with me in October 2018.       ROS:  ROS:  A complete review of systems was performed.  All systems are negative except for pertinent findings as noted.   ROS  Allergies  Allergen Reactions  . Latex Rash  . Caudal Tray [Lidocaine-Epinephrine]     Edema    . Codeine Nausea Only  . Diovan [Valsartan] Itching  . Doxycycline Other (See Comments)    Might have caused heart to race per pt - not sure  . Esomeprazole Magnesium     Headache    . Levofloxacin     Insomnia    . Myrbetriq [Mirabegron] Diarrhea  . Neomycin-Bacitracin Zn-Polymyx     rash  . Penicillins     hives  . Sulfa Drugs Cross Reactors Other (See Comments)    Pt not sure what reaction was  . Verapamil     Edema   . Vimovo [Naproxen-Esomeprazole]  Upset stomach     Outpatient Encounter Medications as of 08/15/2019  Medication Sig  . acetaminophen (TYLENOL) 500 MG tablet Take 500 mg by mouth every 6 (six) hours as needed (pain).   . Ascorbic Acid (VITAMIN C PO) Take 1 tablet by mouth daily.  . Calcium Carbonate-Vitamin D (CALCIUM-VITAMIN D) 500-200 MG-UNIT per tablet Take 1 tablet by mouth daily.  . cholecalciferol (VITAMIN D) 1000 UNITS tablet Take 2,000 Units by mouth daily.   . citalopram (CELEXA) 20 MG tablet Take 1 tablet (20 mg total) by mouth 2 (two) times daily.  . cyclobenzaprine (FLEXERIL) 5 MG tablet Take 1 tablet (5 mg total) by mouth 3 (three) times daily as needed for muscle spasms.  Marland Kitchen dicyclomine (BENTYL) 10 MG capsule TAKE 1 CAPSULE BY MOUTH FOUR TIMES DAILY, BEFORE MEALS AND AT BEDTIME  . Dulaglutide (TRULICITY) 1.61 WR/6.0AV SOPN Inject 0.75 mg into the skin once a week.  . fluticasone  (FLONASE) 50 MCG/ACT nasal spray INSTILL 2 SPRAYS INTO EACH NOSTRIL EVERY DAY  . folic acid (FOLVITE) 1 MG tablet TAKE 1 TABLET(1 MG) BY MOUTH DAILY (Patient taking differently: 2 mg. )  . furosemide (LASIX) 20 MG tablet Take 1 tablet (20 mg total) by mouth daily.  Marland Kitchen glucose blood (ONETOUCH ULTRA) test strip Test BS daily Dx E11.9  . levETIRAcetam (KEPPRA) 500 MG tablet TAKE 1 TABLET BY MOUTH TWICE A DAY  . levothyroxine (SYNTHROID) 88 MCG tablet Take 1 tablet (88 mcg total) by mouth daily before breakfast.  . loperamide (IMODIUM) 2 MG capsule Take by mouth as needed for diarrhea or loose stools (Take as directed). Reported on 05/17/2015  . metFORMIN (GLUCOPHAGE-XR) 500 MG 24 hr tablet TAKE 2 TABLETS (1,000MG ) BY MOUTH TWICE A DAY AFTER A MEAL  . methotrexate (RHEUMATREX) 2.5 MG tablet Take 4 tablets (10 mg total) by mouth once a week. Caution:Chemotherapy. Protect from light.  . metoprolol tartrate (LOPRESSOR) 50 MG tablet Take 1 tablet (50 mg total) by mouth 2 (two) times daily.  . mirtazapine (REMERON) 15 MG tablet Take 1 tablet (15 mg total) by mouth at bedtime.  . Multiple Vitamins-Minerals (EYE VITAMINS PO) Take 2 tablets by mouth daily. AREDS formula  . NYSTATIN powder APPLY TWICE DAILY UNDERNEATH BREASTS  . omeprazole (PRILOSEC) 40 MG capsule Take 1 capsule (40 mg total) by mouth daily.  . potassium chloride SA (KLOR-CON) 20 MEQ tablet Take 2 tablets (40 mEq total) by mouth 2 (two) times daily.  . Probiotic Product (PROBIOTIC PO) Take 1 capsule by mouth daily.  . rivaroxaban (XARELTO) 20 MG TABS tablet Take 1 tablet (20 mg total) by mouth daily.  . simvastatin (ZOCOR) 40 MG tablet Take 1 tablet (40 mg total) by mouth daily.  . traMADol (ULTRAM) 50 MG tablet Take 1 tablet (50 mg total) by mouth every 6 (six) hours as needed. Take 1/2 tab by mouth four times daily as needed for severe pain (Patient taking differently: Take 25 mg by mouth 4 (four) times daily as needed. )  . nitrofurantoin,  macrocrystal-monohydrate, (MACROBID) 100 MG capsule Take 1 capsule (100 mg total) by mouth every 12 (twelve) hours.   No facility-administered encounter medications on file as of 08/15/2019.    Past Medical History:  Diagnosis Date  . Anemia   . Bronchial spasms   . Chronic anxiety   . Chronic kidney disease   . Complication of anesthesia    " I shake real bad "  . Diverticulosis   . Elevated blood  sugar   . Esophageal stricture   . Family history of anesthesia complication    Daughter also shakes while waking up  . GERD (gastroesophageal reflux disease)   . H/O hiatal hernia   . H/O scarlet fever   . History of bladder repair surgery   . Hx of vaginal hysterectomy   . Hypertension   . Hypothyroidism   . IBS (irritable bowel syndrome)    vs diarrhea vs abd. fullness   . Impaired glucose tolerance   . Neuromuscular disorder ([place])    periferal neuropathy  . Pacemaker    St. Jude  . Paroxysmal atrial fibrillation (HCC)    a. failed flecainide, tikosyn, amio;  b. 01/2012 s/p RFCA.  . Psoriasis   . Psoriasis   . Rectocele, female   . Seizures ([place])   . Stroke ([place])   . Tachy-brady syndrome ([place])    a. 08/19/2008 s/p PPM: SJM 2110 Accent  . Uterine prolapse   . Vaginal prolapse 1998    Past Surgical History:  Procedure Laterality Date  . ATRIAL FIBRILLATION ABLATION  02/06/12   PVI by Dr Rayann Heman  . ATRIAL FIBRILLATION ABLATION  07/04/2012   repeat PVI by Dr Rayann Heman  . ATRIAL FIBRILLATION ABLATION N/A 02/06/2012   Procedure: ATRIAL FIBRILLATION ABLATION;  Surgeon: Thompson Grayer, MD;  Location: [facility] CATH LAB;  Service: Cardiovascular;  Laterality: N/A;  . ATRIAL FIBRILLATION ABLATION N/A 07/04/2012   Procedure: ATRIAL FIBRILLATION ABLATION;  Surgeon: Thompson Grayer, MD;  Location: [facility] CATH LAB;  Service: Cardiovascular;  Laterality: N/A;  . BLADDER SUSPENSION    . INSERT / REPLACE / REMOVE PACEMAKER     SJM  . LEFT HEART CATHETERIZATION WITH CORONARY ANGIOGRAM N/A 03/24/2013    Procedure: LEFT HEART CATHETERIZATION WITH CORONARY ANGIOGRAM;  Surgeon: Blane Ohara, MD;  Location: [facility] CATH LAB;  Service: Cardiovascular;  Laterality: N/A;  . RECTOCELE REPAIR    . TEE WITHOUT CARDIOVERSION  02/06/2012   Procedure: TRANSESOPHAGEAL ECHOCARDIOGRAM (TEE);  Surgeon: Thayer Headings, MD;  Location: [place];  Service: Cardiovascular;  Laterality: N/A;  . TEE WITHOUT CARDIOVERSION N/A 07/04/2012   Procedure: TRANSESOPHAGEAL ECHOCARDIOGRAM (TEE);  Surgeon: Lelon Perla, MD;  Location: [facility] ENDOSCOPY;  Service: Cardiovascular;  Laterality: N/A;  . TRANSTHORACIC ECHOCARDIOGRAM  2008  . VAGINAL HYSTERECTOMY     prolapse     Social History   Socioeconomic History  . Marital status: Married    Spouse name: [name]"  . Number of children: 2  . Years of education: 14 yrs  . Highest education level: Associate degree: academic program  Occupational History  . 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  Tobacco Use  . Smoking status: Never Smoker  . Smokeless tobacco: Never Used  Vaping Use  . Vaping Use: Never used  Substance and Sexual Activity  . Alcohol use: No  . Drug use: No  . Sexual activity: Yes  Other Topics Concern  . Not on file  Social History Narrative   Lives in [place].      Left-handed   Caffeine use: drinks coffee/tea: 2 cups coffee in the morning, drinks 1 glass tea per day    Social Determinants of Health   Financial Resource Strain:   . Difficulty of Paying Living Expenses:   Food Insecurity:   . Worried About Charity fundraiser in the Last Year:   . Arboriculturist in the Last Year:   Transportation Needs:   .  Lack of Transportation (Medical):   Marland Kitchen Lack of Transportation (Non-Medical):   Physical Activity:   . Days of Exercise per Week:   . Minutes of Exercise per Session:   Stress:   . Feeling of Stress :   Social Connections:   . Frequency of Communication with Friends and Family:   . Frequency of  Social Gatherings with Friends and Family:   . Attends Religious Services:   . Active Member of Clubs or Organizations:   . Attends Archivist Meetings:   Marland Kitchen Marital Status:   Intimate Partner Violence:   . Fear of Current or Ex-Partner:   . Emotionally Abused:   Marland Kitchen Physically Abused:   . Sexually Abused:     Family History  Problem Relation Age of Onset  . Diabetes Mother   . Heart disease Mother   . Heart failure Mother   . Macular degeneration Mother   . Heart disease Father   . Breast cancer Sister   . Ovarian cancer Sister   . Uterine cancer Paternal Aunt   . Crohn's disease Other        neice  . Diabetes Maternal Grandmother   . Diabetes Sister   . Heart disease Brother   . Heart disease Brother   . Macular degeneration Brother   . Colon cancer Neg Hx   . Stomach cancer Neg Hx        Objective: Vitals:   08/15/19 1146  BP: (!) 147/81  Pulse: 79  Temp: (!) 97.2 F (36.2 C)     Physical Exam  Lab Results:  Results for orders placed or performed in visit on 08/15/19 (from the past 24 hour(s))  POCT urinalysis dipstick     Status: Abnormal   Collection Time: 08/15/19 11:59 AM  Result Value Ref Range   Color, UA dk yellow    Clarity, UA hazy    Glucose, UA Negative Negative   Bilirubin, UA neg    Ketones, UA small    Spec Grav, UA 1.025 1.010 - 1.025   Blood, UA large    pH, UA 6.0 5.0 - 8.0   Protein, UA Positive (A) Negative   Urobilinogen, UA negative (A) 0.2 or 1.0 E.U./dL   Nitrite, UA pos    Leukocytes, UA Large (3+) (A) Negative   Appearance     Odor      BMET No results for input(s): NA, K, CL, CO2, GLUCOSE, BUN, CREATININE, CALCIUM in the last 72 hours. PSA No results found for: PSA No results found for: TESTOSTERONE    Studies/Results: No results found.    Assessment & Plan: Chronic cystitis.   UA looks infected today.  I will culture the urine and start nitrofurantoin.  She will need suppression to prevent infection  if she is to undergo a ventral hernia repair.   OAB wet.  She has continued issues with incontinence and has not done well with meds.  They are going to look into the home [place] system.       Meds ordered this encounter  Medications  . nitrofurantoin, macrocrystal-monohydrate, (MACROBID) 100 MG capsule    Sig: Take 1 capsule (100 mg total) by mouth every 12 (twelve) hours.    Dispense:  20 capsule    Refill:  0     Orders Placed This Encounter  Procedures  . Urine Culture    Standing Status:   Future    Standing Expiration Date:   09/14/2019  . Urinalysis, Routine  w reflex microscopic    Standing Status:   Future    Standing Expiration Date:   09/14/2019  . POCT urinalysis dipstick      Return in about 6 weeks (around 09/26/2019).   CC: Dettinger, Fransisca Kaufmann, MD      Irine Seal 08/15/2019

## 2019-08-15 ENCOUNTER — Ambulatory Visit (INDEPENDENT_AMBULATORY_CARE_PROVIDER_SITE_OTHER): Payer: Medicare Other | Admitting: Urology

## 2019-08-15 ENCOUNTER — Other Ambulatory Visit (HOSPITAL_COMMUNITY)
Admission: RE | Admit: 2019-08-15 | Discharge: 2019-08-15 | Disposition: A | Discharge status: Home / Self Care | Payer: Medicare Other | Source: Other Acute Inpatient Hospital | Attending: Urology | Admitting: Urology

## 2019-08-15 ENCOUNTER — Encounter: Payer: Self-pay | Admitting: Urology

## 2019-08-15 ENCOUNTER — Other Ambulatory Visit: Payer: Self-pay

## 2019-08-15 VITALS — BP 147/81 | HR 79 | Temp 97.2°F | Ht 64.0 in | Wt 150.0 lb

## 2019-08-15 DIAGNOSIS — N3020 Other chronic cystitis without hematuria: Secondary | ICD-10-CM | POA: Insufficient documentation

## 2019-08-15 DIAGNOSIS — N3941 Urge incontinence: Secondary | ICD-10-CM

## 2019-08-15 LAB — POCT URINALYSIS DIPSTICK
Bilirubin, UA: NEGATIVE
Glucose, UA: NEGATIVE
Nitrite, UA: POSITIVE
Protein, UA: POSITIVE — AB
Spec Grav, UA: 1.025 (ref 1.010–1.025)
Urobilinogen, UA: NEGATIVE E.U./dL — AB
pH, UA: 6 (ref 5.0–8.0)

## 2019-08-15 LAB — URINALYSIS, ROUTINE W REFLEX MICROSCOPIC
Bilirubin Urine: NEGATIVE
Glucose, UA: NEGATIVE mg/dL
Ketones, ur: 5 mg/dL — AB
Nitrite: NEGATIVE
Protein, ur: 30 mg/dL — AB
Specific Gravity, Urine: 1.016 (ref 1.005–1.030)
WBC, UA: >50 WBC/hpf — ABNORMAL HIGH (ref 0–5)
pH: 5 (ref 5.0–8.0)

## 2019-08-15 MED ORDER — NITROFURANTOIN MONOHYD MACRO 100 MG PO CAPS: 100.0000 mg | ORAL_CAPSULE | Freq: Two times a day (BID) | ORAL | 0 refills | Status: DC

## 2019-08-15 NOTE — Progress Notes (Signed)
Urological Symptom Review  Patient is experiencing the following symptoms: Frequent urination Hard to postpone urination Burning/pain with urination Urinary tract infection Weak stream   Review of Systems  Gastrointestinal (upper)  : Negative for upper GI symptoms  Gastrointestinal (lower) : Diarrhea  Constitutional : Weight loss  Skin: Negative for skin symptoms  Eyes: Negative for eye symptoms  Ear/Nose/Throat : Negative for Ear/Nose/Throat symptoms  Hematologic/Lymphatic: Easy bruising  Cardiovascular : Negative for cardiovascular symptoms  Respiratory : Negative for respiratory symptoms  Endocrine: Negative for endocrine symptoms  Musculoskeletal: Negative for musculoskeletal symptoms  Neurological: Negative for neurological symptoms  Psychologic: Depression Anxiety

## 2019-08-17 LAB — URINE CULTURE: Culture: >=100000 — AB

## 2019-08-18 ENCOUNTER — Telehealth: Payer: Self-pay | Admitting: Family Medicine

## 2019-08-18 ENCOUNTER — Other Ambulatory Visit: Payer: Self-pay

## 2019-08-18 ENCOUNTER — Other Ambulatory Visit: Payer: Self-pay | Admitting: Urology

## 2019-08-18 MED ORDER — NITROFURANTOIN MACROCRYSTAL 50 MG PO CAPS: 50.0000 mg | ORAL_CAPSULE | Freq: Every day | ORAL | 5 refills | Status: DC

## 2019-08-18 NOTE — Telephone Encounter (Signed)
I am not familiar with bleeding being a side effect of nitrofurantoin but on review there are uncommon reports of bloody diarrhea and easy bruising.  I don't see nose bleeds, but it would be best to stop the med if she is having severe side effects.  I am not sure what else we will be able to use.  I will review her culture but is best she stay off of antibiotics for a few days first.

## 2019-08-18 NOTE — Telephone Encounter (Signed)
Yes if they can please get [place] the records and let us know what that specific medication was that they recommended.

## 2019-08-18 NOTE — Telephone Encounter (Signed)
-----   Message from Irine Seal, MD sent at 08/18/2019  4:35 PM EDT ----- She needs to complete the macrobid and then I sent nitrofurantoin 50mg  po qhs #90 with a refill.   She would need f/u in about 3 months if not already scheduled for f/u.  ----- Message ----- From: Dorisann Frames, RN Sent: 08/18/2019  12:11 PM EDT To: Irine Seal, MD  UA and Culture

## 2019-08-18 NOTE — Telephone Encounter (Signed)
Pts husband states his wife had to go to the ER in [place] due to not being able to walk and very weak. Hospital stated for them to call and advise Dr. Warrick Parisian that she had to go to the ER. Spouse is also requesting to speak with Almyra Free regarding a medication she would like to go on and for medication issues the pt has had. [facility] to send records per spouse.

## 2019-08-18 NOTE — Telephone Encounter (Signed)
FYI for provider and pharmacist.

## 2019-08-19 ENCOUNTER — Telehealth: Payer: Self-pay | Admitting: Family Medicine

## 2019-08-19 NOTE — Telephone Encounter (Signed)
I spoke with the husband, pt will hold antibiotic and call my back this Friday with any changes regarding the bloody stool and nose bleeds.

## 2019-08-19 NOTE — Telephone Encounter (Signed)
Mardene Celeste calling to let the provider know she did a start of care. She states there is a drug interaction with between some of the pts medications. Reported blood in urine. Seeing urologist this week. The drug interactions were dicyclomine and potassium. The other tramadol and cyclobenzaerine. She advised the pt to not take these medications at the same time, to take separately until the provider advises.

## 2019-08-20 NOTE — Telephone Encounter (Signed)
Patient should go ahead and stop the potassium, it does interact with the dicyclomine.  The tramadol and cyclobenzaprine have a mild interaction that I am not concerned about and she can go ahead and continue both of those together.

## 2019-08-22 ENCOUNTER — Telehealth: Payer: Self-pay | Admitting: Urology

## 2019-08-22 MED ORDER — CEPHALEXIN 500 MG PO CAPS: 500.0000 mg | ORAL_CAPSULE | Freq: Three times a day (TID) | ORAL | 0 refills | Status: DC

## 2019-08-22 NOTE — Telephone Encounter (Signed)
We could give her keflex 500mg  po tid #30.

## 2019-08-22 NOTE — Telephone Encounter (Signed)
Left message with details about medications on patient's home number.  Ask for her husband to call back and confirm details.  ( Home health personnel off work for family emergency.  She says to call [phone]  for any concerns).

## 2019-08-22 NOTE — Telephone Encounter (Signed)
Husband called back today to stated bleeding issues have resolved. Husband was asking if you would like to prescribe another antibiotic that she has currently.

## 2019-08-22 NOTE — Telephone Encounter (Signed)
Pts husband returning the call to the nurse. He states he is unsure if the his wife needs to come in for an appt after she had a stroke or not. Also he is wanting to see who set her up with physical therapy. Please advise. May leave a VM.

## 2019-08-22 NOTE — Addendum Note (Signed)
Addended by: Dorisann Frames on: 08/22/2019 03:04 PM   Modules accepted: Orders

## 2019-08-22 NOTE — Telephone Encounter (Signed)
Husband called and made aware of prescription sent to pharmacy.

## 2019-08-22 NOTE — Telephone Encounter (Signed)
Patient requests return call from nurse regarding medication issue.

## 2019-08-22 NOTE — Telephone Encounter (Signed)
Left patient detailed message to see if patient had been in hospital if so needs hospital follow up

## 2019-08-23 ENCOUNTER — Other Ambulatory Visit: Payer: Self-pay | Admitting: Family Medicine

## 2019-08-27 ENCOUNTER — Other Ambulatory Visit: Payer: Self-pay

## 2019-08-27 ENCOUNTER — Ambulatory Visit (INDEPENDENT_AMBULATORY_CARE_PROVIDER_SITE_OTHER): Payer: Medicare Other

## 2019-08-27 DIAGNOSIS — F418 Other specified anxiety disorders: Secondary | ICD-10-CM

## 2019-08-27 DIAGNOSIS — E039 Hypothyroidism, unspecified: Secondary | ICD-10-CM

## 2019-08-27 DIAGNOSIS — I69091 Dysphagia following nontraumatic subarachnoid hemorrhage: Secondary | ICD-10-CM

## 2019-08-27 DIAGNOSIS — R531 Weakness: Secondary | ICD-10-CM | POA: Diagnosis not present

## 2019-08-27 DIAGNOSIS — N319 Neuromuscular dysfunction of bladder, unspecified: Secondary | ICD-10-CM

## 2019-08-27 DIAGNOSIS — I495 Sick sinus syndrome: Secondary | ICD-10-CM

## 2019-08-27 DIAGNOSIS — K5909 Other constipation: Secondary | ICD-10-CM

## 2019-08-27 DIAGNOSIS — Z79899 Other long term (current) drug therapy: Secondary | ICD-10-CM

## 2019-08-27 DIAGNOSIS — I4891 Unspecified atrial fibrillation: Secondary | ICD-10-CM

## 2019-08-27 DIAGNOSIS — I69351 Hemiplegia and hemiparesis following cerebral infarction affecting right dominant side: Secondary | ICD-10-CM

## 2019-08-27 DIAGNOSIS — E875 Hyperkalemia: Secondary | ICD-10-CM

## 2019-08-27 DIAGNOSIS — I2510 Atherosclerotic heart disease of native coronary artery without angina pectoris: Secondary | ICD-10-CM

## 2019-08-27 DIAGNOSIS — I69320 Aphasia following cerebral infarction: Secondary | ICD-10-CM | POA: Diagnosis not present

## 2019-08-27 DIAGNOSIS — I10 Essential (primary) hypertension: Secondary | ICD-10-CM

## 2019-08-27 DIAGNOSIS — Z950 Presence of cardiac pacemaker: Secondary | ICD-10-CM

## 2019-08-27 DIAGNOSIS — Z7901 Long term (current) use of anticoagulants: Secondary | ICD-10-CM

## 2019-08-27 DIAGNOSIS — N390 Urinary tract infection, site not specified: Secondary | ICD-10-CM | POA: Diagnosis not present

## 2019-08-27 DIAGNOSIS — K219 Gastro-esophageal reflux disease without esophagitis: Secondary | ICD-10-CM

## 2019-08-27 DIAGNOSIS — G8929 Other chronic pain: Secondary | ICD-10-CM

## 2019-08-27 DIAGNOSIS — Z7984 Long term (current) use of oral hypoglycemic drugs: Secondary | ICD-10-CM

## 2019-08-27 DIAGNOSIS — E1142 Type 2 diabetes mellitus with diabetic polyneuropathy: Secondary | ICD-10-CM

## 2019-08-27 DIAGNOSIS — K592 Neurogenic bowel, not elsewhere classified: Secondary | ICD-10-CM

## 2019-08-27 DIAGNOSIS — K589 Irritable bowel syndrome without diarrhea: Secondary | ICD-10-CM

## 2019-08-27 DIAGNOSIS — L409 Psoriasis, unspecified: Secondary | ICD-10-CM

## 2019-08-27 DIAGNOSIS — M549 Dorsalgia, unspecified: Secondary | ICD-10-CM

## 2019-08-29 ENCOUNTER — Telehealth: Payer: Self-pay | Admitting: Family Medicine

## 2019-08-29 NOTE — Telephone Encounter (Signed)
FYI

## 2019-09-01 ENCOUNTER — Encounter: Payer: Self-pay | Admitting: Family Medicine

## 2019-09-01 ENCOUNTER — Other Ambulatory Visit: Payer: Self-pay

## 2019-09-01 ENCOUNTER — Ambulatory Visit (INDEPENDENT_AMBULATORY_CARE_PROVIDER_SITE_OTHER): Payer: Medicare Other | Admitting: Family Medicine

## 2019-09-01 VITALS — BP 130/75 | HR 73 | Temp 97.3°F | Ht 64.0 in | Wt 149.0 lb

## 2019-09-01 DIAGNOSIS — R531 Weakness: Secondary | ICD-10-CM | POA: Diagnosis not present

## 2019-09-01 DIAGNOSIS — R413 Other amnesia: Secondary | ICD-10-CM | POA: Diagnosis not present

## 2019-09-01 DIAGNOSIS — R32 Unspecified urinary incontinence: Secondary | ICD-10-CM | POA: Diagnosis not present

## 2019-09-01 DIAGNOSIS — Z8673 Personal history of transient ischemic attack (TIA), and cerebral infarction without residual deficits: Secondary | ICD-10-CM

## 2019-09-01 NOTE — Progress Notes (Signed)
BP 130/75   Pulse 73   Temp (!) 97.3 F (36.3 C)   Ht '5\' 4"'$  (1.626 m)   Wt 149 lb (67.6 kg)   SpO2 97%   BMI 25.58 kg/m    Subjective:   Patient ID: [name], female    DOB: 1945/05/02, 74 y.o.   MRN: [id number]  HPI: [name] is a 74 y.o. female presenting on 09/01/2019 for Hospitalization Follow-up and Fall   HPI Patient is coming in for hospital follow-up, she was admitted to the hospital on 08/15/2019 and discharged on 08/16/2019.  She was seen because she had a fall and was having some more weakness.  After getting out she has had 1 more fall and per her husband she has been having continued weakness on the right side which is more than what she had previously.  She was treated for UTI but he does not seem to see improvement with the antibiotic although she has almost completely finished it.  He is not noting any urinary issues except for that she is incontinent but she has been incontinent before and it has not changed.  She is still taking and finishing the antibiotic.  She is still having issues even stand up on her own which she could do somewhat previously since the previous stroke.  She is not coming back to her baseline and he is also noted that she is having some confusion as well.  Relevant past medical, surgical, family and social history reviewed and updated as indicated. Interim medical history since our last visit reviewed. Allergies and medications reviewed and updated.  Review of Systems  Constitutional: Negative for chills and fever.  Eyes: Negative for redness and visual disturbance.  Respiratory: Negative for chest tightness and shortness of breath.   Cardiovascular: Negative for chest pain and leg swelling.  Genitourinary: Negative for difficulty urinating, dysuria, frequency and urgency.  Musculoskeletal: Negative for back pain and gait problem.  Skin: Negative for rash.  Neurological: Negative for light-headedness and headaches.    Psychiatric/Behavioral: Positive for confusion. Negative for agitation, behavioral problems, self-injury, sleep disturbance and suicidal ideas. The patient is not nervous/anxious.   All other systems reviewed and are negative.   Per HPI unless specifically indicated above   Allergies as of 09/01/2019      Reactions   Latex Rash   Caudal Tray [lidocaine-epinephrine]    Edema    Codeine Nausea Only   Diovan [valsartan] Itching   Doxycycline Other (See Comments)   Might have caused heart to race per pt - not sure   Esomeprazole Magnesium    Headache    Levofloxacin    Insomnia    Myrbetriq [mirabegron] Diarrhea   Neomycin-bacitracin Zn-polymyx    rash   Penicillins    hives   Sulfa Drugs Cross Reactors Other (See Comments)   Pt not sure what reaction was   Verapamil    Edema   Vimovo [naproxen-esomeprazole]    Upset stomach      Medication List       Accurate as of September 01, 2019  3:03 PM. If you have any questions, ask your nurse or doctor.        acetaminophen 500 MG tablet Commonly known as: TYLENOL Take 500 mg by mouth every 6 (six) hours as needed (pain).   calcium-vitamin D 500-200 MG-UNIT tablet Take 1 tablet by mouth daily.   cephALEXin 500 MG capsule Commonly known as: Keflex Take 1 capsule (500 mg  total) by mouth 3 (three) times daily.   cholecalciferol 1000 units tablet Commonly known as: VITAMIN D Take 2,000 Units by mouth daily.   citalopram 20 MG tablet Commonly known as: CELEXA Take 1 tablet (20 mg total) by mouth 2 (two) times daily.   cyclobenzaprine 5 MG tablet Commonly known as: FLEXERIL Take 1 tablet (5 mg total) by mouth 3 (three) times daily as needed for muscle spasms.   dicyclomine 10 MG capsule Commonly known as: BENTYL TAKE 1 CAPSULE BY MOUTH FOUR TIMES DAILY, BEFORE MEALS AND AT BEDTIME   EYE VITAMINS PO Take 2 tablets by mouth daily. AREDS formula   fluticasone 50 MCG/ACT nasal spray Commonly known as: FLONASE INSTILL 2  SPRAYS INTO EACH NOSTRIL EVERY DAY   folic acid 1 MG tablet Commonly known as: FOLVITE TAKE 1 TABLET(1 MG) BY MOUTH DAILY What changed: See the new instructions.   furosemide 20 MG tablet Commonly known as: LASIX Take 1 tablet (20 mg total) by mouth daily.   Klor-Con M20 20 MEQ tablet Generic drug: potassium chloride SA TAKE 2 TABLETS (40 MEQ TOTAL) BY MOUTH 2 (TWO) TIMES DAILY.   levETIRAcetam 500 MG tablet Commonly known as: KEPPRA TAKE 1 TABLET BY MOUTH TWICE A DAY   levothyroxine 88 MCG tablet Commonly known as: SYNTHROID Take 1 tablet (88 mcg total) by mouth daily before breakfast.   loperamide 2 MG capsule Commonly known as: IMODIUM Take by mouth as needed for diarrhea or loose stools (Take as directed). Reported on 05/17/2015   metFORMIN 500 MG 24 hr tablet Commonly known as: GLUCOPHAGE-XR TAKE 2 TABLETS (1,'000MG'$ ) BY MOUTH TWICE A DAY AFTER A MEAL   methotrexate 2.5 MG tablet Commonly known as: RHEUMATREX Take 4 tablets (10 mg total) by mouth once a week. Caution:Chemotherapy. Protect from light.   metoprolol tartrate 50 MG tablet Commonly known as: LOPRESSOR Take 1 tablet (50 mg total) by mouth 2 (two) times daily.   mirtazapine 15 MG tablet Commonly known as: REMERON Take 1 tablet (15 mg total) by mouth at bedtime.   nitrofurantoin (macrocrystal-monohydrate) 100 MG capsule Commonly known as: MACROBID Take 1 capsule (100 mg total) by mouth every 12 (twelve) hours.   nitrofurantoin 50 MG capsule Commonly known as: MACRODANTIN Take 1 capsule (50 mg total) by mouth at bedtime.   nystatin powder Generic drug: nystatin APPLY TWICE DAILY UNDERNEATH BREASTS   omeprazole 40 MG capsule Commonly known as: PRILOSEC Take 1 capsule (40 mg total) by mouth daily.   OneTouch Ultra test strip Generic drug: glucose blood Test BS daily Dx E11.9   PROBIOTIC PO Take 1 capsule by mouth daily.   rivaroxaban 20 MG Tabs tablet Commonly known as: Xarelto Take 1 tablet  (20 mg total) by mouth daily.   simvastatin 40 MG tablet Commonly known as: ZOCOR Take 1 tablet (40 mg total) by mouth daily.   traMADol 50 MG tablet Commonly known as: ULTRAM Take 1 tablet (50 mg total) by mouth every 6 (six) hours as needed. Take 1/2 tab by mouth four times daily as needed for severe pain What changed:   how much to take  when to take this  additional instructions   Trulicity 0.73 XT/0.6YI Sopn Generic drug: Dulaglutide Inject 0.75 mg into the skin once a week.   VITAMIN C PO Take 1 tablet by mouth daily.        Objective:   BP 130/75   Pulse 73   Temp (!) 97.3 F (36.3 C)  Ht '5\' 4"'$  (1.626 m)   Wt 149 lb (67.6 kg)   SpO2 97%   BMI 25.58 kg/m   Wt Readings from Last 3 Encounters:  09/01/19 149 lb (67.6 kg)  08/15/19 150 lb (68 kg)  08/06/19 150 lb (68 kg)    Physical Exam Vitals and nursing note reviewed.  Constitutional:      General: She is not in acute distress.    Appearance: She is well-developed. She is not diaphoretic.  Eyes:     Conjunctiva/sclera: Conjunctivae normal.  Cardiovascular:     Rate and Rhythm: Normal rate and regular rhythm.     Heart sounds: Normal heart sounds. No murmur heard.   Pulmonary:     Effort: Pulmonary effort is normal. No respiratory distress.     Breath sounds: Normal breath sounds. No wheezing.  Musculoskeletal:        General: Normal range of motion.  Skin:    General: Skin is warm and dry.     Findings: No rash.  Neurological:     Mental Status: She is alert and oriented to person, place, and time.     Cranial Nerves: No cranial nerve deficit.     Motor: Weakness (Right-sided weakness, wheelchair-bound) present.     Coordination: Coordination normal.  Psychiatric:        Behavior: Behavior normal.       Assessment & Plan:   Problem List Items Addressed This Visit    None    Visit Diagnoses    Urinary incontinence, unspecified type    -  Primary   Relevant Orders   For home use  only DME Other see comment   CBC with Differential/Platelet   CMP14+EGFR   Status post CVA       Relevant Orders   For home use only DME Other see comment   CBC with Differential/Platelet   CMP14+EGFR   Memory deficit       Relevant Orders   For home use only DME Other see comment   CBC with Differential/Platelet   CMP14+EGFR   Right sided weakness       Relevant Orders   For home use only DME Other see comment   CBC with Differential/Platelet   CMP14+EGFR      Patient has neurologist r appointment already in place for later this month.  Did a prescription for external catheters for at nighttime.  She has home therapy and will continue to work with them. Follow up plan: Return in about 3 months (around 12/02/2019), or if symptoms worsen or fail to improve, for Follow-up CVA and strength.  Counseling provided for all of the vaccine components No orders of the defined types were placed in this encounter.   Caryl Pina, MD [facility] 09/01/2019, 3:03 PM

## 2019-09-02 ENCOUNTER — Other Ambulatory Visit: Payer: Self-pay | Admitting: Family Medicine

## 2019-09-04 ENCOUNTER — Other Ambulatory Visit: Payer: Self-pay

## 2019-09-04 ENCOUNTER — Ambulatory Visit (INDEPENDENT_AMBULATORY_CARE_PROVIDER_SITE_OTHER): Payer: Medicare Other | Admitting: Internal Medicine

## 2019-09-04 ENCOUNTER — Encounter: Payer: Self-pay | Admitting: Internal Medicine

## 2019-09-04 VITALS — BP 118/66 | HR 78 | Ht 64.0 in | Wt 148.9 lb

## 2019-09-04 DIAGNOSIS — I495 Sick sinus syndrome: Secondary | ICD-10-CM | POA: Diagnosis not present

## 2019-09-04 DIAGNOSIS — I480 Paroxysmal atrial fibrillation: Secondary | ICD-10-CM

## 2019-09-04 DIAGNOSIS — Z950 Presence of cardiac pacemaker: Secondary | ICD-10-CM | POA: Diagnosis not present

## 2019-09-04 NOTE — Patient Instructions (Signed)
Medication Instructions:  Your physician recommends that you continue on your current medications as directed. Please refer to the Current Medication list given to you today.  Labwork: None ordered.  Testing/Procedures: None ordered.  Follow-Up: Your physician wants you to follow-up in: one year with Dr. Lovena Le.   You will receive a reminder letter in the mail two months in advance. If you don't receive a letter, please call our office to schedule the follow-up appointment.  Remote monitoring is used to monitor your Pacemaker from home. This monitoring reduces the number of office visits required to check your device to one time per year. It allows [place] to keep an eye on the functioning of your device to ensure it is working properly. You are scheduled for a device check from home on 09/11/2019. You may send your transmission at any time that day. If you have a wireless device, the transmission will be sent automatically. After your physician reviews your transmission, you will receive a postcard with your next transmission date.  Any Other Special Instructions Will Be Listed Below (If Applicable).  If you need a refill on your cardiac medications before your next appointment, please call your pharmacy.

## 2019-09-04 NOTE — Progress Notes (Signed)
HPI [name] returns today for preoperative diagnosis. She is a pleasant 74 yo woman with a h/o PAF, HTN, prior stroke, and dense expresive aphasia. She has a right HP. She is considering urological surgery. Her husband who is with her notes that her appetite is down and she has lost 2 lbs in the past month. Allergies  Allergen Reactions  . Latex Rash  . Caudal Tray [Lidocaine-Epinephrine]     Edema    . Codeine Nausea Only  . Diovan [Valsartan] Itching  . Doxycycline Other (See Comments)    Might have caused heart to race per pt - not sure  . Esomeprazole Magnesium     Headache    . Levofloxacin     Insomnia    . Myrbetriq [Mirabegron] Diarrhea  . Neomycin-Bacitracin Zn-Polymyx     rash  . Penicillins     hives  . Sulfa Drugs Cross Reactors Other (See Comments)    Pt not sure what reaction was  . Verapamil     Edema   . Vimovo [Naproxen-Esomeprazole]     Upset stomach      Current Outpatient Medications  Medication Sig Dispense Refill  . acetaminophen (TYLENOL) 500 MG tablet Take 500 mg by mouth every 6 (six) hours as needed (pain).     . Ascorbic Acid (VITAMIN C PO) Take 1 tablet by mouth daily.    . Calcium Carbonate-Vitamin D (CALCIUM-VITAMIN D) 500-200 MG-UNIT per tablet Take 1 tablet by mouth daily.    . cephALEXin (KEFLEX) 500 MG capsule Take 1 capsule (500 mg total) by mouth 3 (three) times daily. 30 capsule 0  . cholecalciferol (VITAMIN D) 1000 UNITS tablet Take 2,000 Units by mouth daily.     . citalopram (CELEXA) 20 MG tablet Take 1 tablet (20 mg total) by mouth 2 (two) times daily. 180 tablet 3  . cyclobenzaprine (FLEXERIL) 5 MG tablet Take 1 tablet (5 mg total) by mouth 3 (three) times daily as needed for muscle spasms. 30 tablet 0  . dicyclomine (BENTYL) 10 MG capsule TAKE 1 CAPSULE BY MOUTH FOUR TIMES DAILY, BEFORE MEALS AND AT BEDTIME 360 capsule 0  . Dulaglutide (TRULICITY) 6.26 RS/8.5IO SOPN Inject 0.75 mg into the skin once a week. 12 pen 3    . fluticasone (FLONASE) 50 MCG/ACT nasal spray INSTILL 2 SPRAYS INTO EACH NOSTRIL EVERY DAY 16 mL 11  . folic acid (FOLVITE) 1 MG tablet TAKE 1 TABLET(1 MG) BY MOUTH DAILY (Patient taking differently: 2 mg. ) 90 tablet 0  . furosemide (LASIX) 20 MG tablet Take 1 tablet (20 mg total) by mouth daily. 90 tablet 3  . glucose blood (ONETOUCH ULTRA) test strip Test BS daily Dx E11.9 100 strip 3  . KLOR-CON M20 20 MEQ tablet TAKE 2 TABLETS (40 MEQ TOTAL) BY MOUTH 2 (TWO) TIMES DAILY. 360 tablet 0  . levETIRAcetam (KEPPRA) 500 MG tablet TAKE 1 TABLET BY MOUTH TWICE A DAY 180 tablet 3  . levothyroxine (SYNTHROID) 88 MCG tablet Take 1 tablet (88 mcg total) by mouth daily before breakfast. 90 tablet 3  . loperamide (IMODIUM) 2 MG capsule Take by mouth as needed for diarrhea or loose stools (Take as directed). Reported on 05/17/2015    . metFORMIN (GLUCOPHAGE-XR) 500 MG 24 hr tablet TAKE 2 TABLETS (1,000MG ) BY MOUTH TWICE A DAY AFTER A MEAL 360 tablet 0  . methotrexate (RHEUMATREX) 2.5 MG tablet Take 4 tablets (10 mg total) by mouth once a week. Caution:Chemotherapy.  Protect from light. 16 tablet 3  . metoprolol tartrate (LOPRESSOR) 50 MG tablet Take 1 tablet (50 mg total) by mouth 2 (two) times daily. 180 tablet 3  . mirtazapine (REMERON) 15 MG tablet Take 1 tablet (15 mg total) by mouth at bedtime. 90 tablet 3  . Multiple Vitamins-Minerals (EYE VITAMINS PO) Take 2 tablets by mouth daily. AREDS formula    . nitrofurantoin (MACRODANTIN) 50 MG capsule Take 1 capsule (50 mg total) by mouth at bedtime. 30 capsule 5  . nitrofurantoin, macrocrystal-monohydrate, (MACROBID) 100 MG capsule Take 1 capsule (100 mg total) by mouth every 12 (twelve) hours. 20 capsule 0  . NYSTATIN powder APPLY TWICE DAILY UNDERNEATH BREASTS 60 g 0  . omeprazole (PRILOSEC) 40 MG capsule Take 1 capsule (40 mg total) by mouth daily. 90 capsule 3  . Probiotic Product (PROBIOTIC PO) Take 1 capsule by mouth daily.    . rivaroxaban (XARELTO) 20  MG TABS tablet Take 1 tablet (20 mg total) by mouth daily. 90 tablet 3  . simvastatin (ZOCOR) 40 MG tablet TAKE 1 TABLET BY MOUTH EVERY DAY 90 tablet 0  . traMADol (ULTRAM) 50 MG tablet Take 1 tablet (50 mg total) by mouth every 6 (six) hours as needed. Take 1/2 tab by mouth four times daily as needed for severe pain (Patient taking differently: Take 25 mg by mouth 4 (four) times daily as needed. ) 90 tablet 1   No current facility-administered medications for this visit.     Past Medical History:  Diagnosis Date  . Anemia   . Bronchial spasms   . Chronic anxiety   . Chronic kidney disease   . Complication of anesthesia    " I shake real bad "  . Diverticulosis   . Elevated blood sugar   . Esophageal stricture   . Family history of anesthesia complication    Daughter also shakes while waking up  . GERD (gastroesophageal reflux disease)   . H/O hiatal hernia   . H/O scarlet fever   . History of bladder repair surgery   . Hx of vaginal hysterectomy   . Hypertension   . Hypothyroidism   . IBS (irritable bowel syndrome)    vs diarrhea vs abd. fullness   . Impaired glucose tolerance   . Neuromuscular disorder ([place])    periferal neuropathy  . Pacemaker    St. Jude  . Paroxysmal atrial fibrillation (HCC)    a. failed flecainide, tikosyn, amio;  b. 01/2012 s/p RFCA.  . Psoriasis   . Psoriasis   . Rectocele, female   . Seizures ([place])   . Stroke ([place])   . Tachy-brady syndrome ([place])    a. 08/19/2008 s/p PPM: SJM 2110 Accent  . Uterine prolapse   . Vaginal prolapse 1998    ROS:   All systems reviewed and negative except as noted in the HPI.   Past Surgical History:  Procedure Laterality Date  . ATRIAL FIBRILLATION ABLATION  02/06/12   PVI by Dr Rayann Heman  . ATRIAL FIBRILLATION ABLATION  07/04/2012   repeat PVI by Dr Rayann Heman  . ATRIAL FIBRILLATION ABLATION N/A 02/06/2012   Procedure: ATRIAL FIBRILLATION ABLATION;  Surgeon: Thompson Grayer, MD;  Location: [facility] CATH LAB;  Service:  Cardiovascular;  Laterality: N/A;  . ATRIAL FIBRILLATION ABLATION N/A 07/04/2012   Procedure: ATRIAL FIBRILLATION ABLATION;  Surgeon: Thompson Grayer, MD;  Location: [facility] CATH LAB;  Service: Cardiovascular;  Laterality: N/A;  . BLADDER SUSPENSION    . INSERT / REPLACE /  REMOVE PACEMAKER     SJM  . LEFT HEART CATHETERIZATION WITH CORONARY ANGIOGRAM N/A 03/24/2013   Procedure: LEFT HEART CATHETERIZATION WITH CORONARY ANGIOGRAM;  Surgeon: Blane Ohara, MD;  Location: [facility] CATH LAB;  Service: Cardiovascular;  Laterality: N/A;  . RECTOCELE REPAIR    . TEE WITHOUT CARDIOVERSION  02/06/2012   Procedure: TRANSESOPHAGEAL ECHOCARDIOGRAM (TEE);  Surgeon: Thayer Headings, MD;  Location: [facility];  Service: Cardiovascular;  Laterality: N/A;  . TEE WITHOUT CARDIOVERSION N/A 07/04/2012   Procedure: TRANSESOPHAGEAL ECHOCARDIOGRAM (TEE);  Surgeon: Lelon Perla, MD;  Location: [facility] ENDOSCOPY;  Service: Cardiovascular;  Laterality: N/A;  . TRANSTHORACIC ECHOCARDIOGRAM  2008  . VAGINAL HYSTERECTOMY     prolapse      Family History  Problem Relation Age of Onset  . Diabetes Mother   . Heart disease Mother   . Heart failure Mother   . Macular degeneration Mother   . Heart disease Father   . Breast cancer Sister   . Ovarian cancer Sister   . Uterine cancer Paternal Aunt   . Crohn's disease Other        neice  . Diabetes Maternal Grandmother   . Diabetes Sister   . Heart disease Brother   . Heart disease Brother   . Macular degeneration Brother   . Colon cancer Neg Hx   . Stomach cancer Neg Hx      Social History   Socioeconomic History  . Marital status: Married    Spouse name: [name]"  . Number of children: 2  . Years of education: 14 yrs  . Highest education level: Associate degree: academic program  Occupational History  . 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  Tobacco Use  . Smoking status: Never Smoker  . Smokeless tobacco: Never Used  Vaping Use  .  Vaping Use: Never used  Substance and Sexual Activity  . Alcohol use: No  . Drug use: No  . Sexual activity: Yes  Other Topics Concern  . Not on file  Social History Narrative   Lives in [place].      Left-handed   Caffeine use: drinks coffee/tea: 2 cups coffee in the morning, drinks 1 glass tea per day    Social Determinants of Health   Financial Resource Strain:   . Difficulty of Paying Living Expenses:   Food Insecurity:   . Worried About Charity fundraiser in the Last Year:   . Arboriculturist in the Last Year:   Transportation Needs:   . Film/video editor (Medical):   Marland Kitchen Lack of Transportation (Non-Medical):   Physical Activity:   . Days of Exercise per Week:   . Minutes of Exercise per Session:   Stress:   . Feeling of Stress :   Social Connections:   . Frequency of Communication with Friends and Family:   . Frequency of Social Gatherings with Friends and Family:   . Attends Religious Services:   . Active Member of Clubs or Organizations:   . Attends Archivist Meetings:   Marland Kitchen Marital Status:   Intimate Partner Violence:   . Fear of Current or Ex-Partner:   . Emotionally Abused:   Marland Kitchen Physically Abused:   . Sexually Abused:      BP 118/66   Pulse 78   Ht 5\' 4"  (1.626 m)   Wt 148 lb 14.4 oz (67.5 kg)   SpO2 97%   BMI 25.56 kg/m   Physical  Exam:  Chronically ill appearing NAD HEENT: Unremarkable Neck:  6 cm JVD, no thyromegally Lymphatics:  No adenopathy Back:  No CVA tenderness Lungs:  Clear with no wheezes HEART:  Regular rate rhythm, no murmurs, no rubs, no clicks Abd:  soft, positive bowel sounds, no organomegally, no rebound, no guarding Ext:  2 plus pulses, no edema, no cyanosis, no clubbing Skin:  No rashes no nodules Neuro:  Dense expressive aphasia and right HP.  EKG - nsr  DEVICE  Normal device function.  See PaceArt for details.   Assess/Plan: 1. PAF - she is maintaining NSR nicely 2. Stroke - she has had a recent  small mini stroke. There was a question about adding ASA to her Xarelto. I recommended she discuss with her neurologist. There is clearly a risk of bleeding. Unclear if it will provide more stroke prevention. 3. PPM - her St. Jude DDD PM is working normally. 4. Preoperative eval - she is low/med risk for cardiovascular complications.   Mikle Bosworth.D.

## 2019-09-09 ENCOUNTER — Telehealth: Payer: Self-pay | Admitting: Urology

## 2019-09-09 ENCOUNTER — Telehealth: Payer: Self-pay

## 2019-09-09 NOTE — Telephone Encounter (Signed)
Pts husband called. He was very confused about the antibiotics his wife has been taking. I had to go though the chart notes with him and sort through what he had given her to take. The first antibiotic rxed was Macrodantin.  After taking a few days pt told him it was bothering her. He called [place] then Dr. Jeffie Pollock rxed Keflex.  In the end though the husband had continued and given her all of the Macrodantin instead and all of the Keflex also. He said they think she still has an infection. He mentioned that a HHN would be coming today. He wanted the infection taken care of and also to change her next appt for Aug the 13th due to her having an appt the 12th with another Dr.  I suggested he get [facility] to get an order from the PCP and she take a spec today. Let the PCP treat a possible infection and if indeed there was one see if it cleared up before next appt. If so we would reschedule then.

## 2019-09-09 NOTE — Telephone Encounter (Signed)
error 

## 2019-09-12 ENCOUNTER — Telehealth: Payer: Self-pay | Admitting: Urology

## 2019-09-12 NOTE — Telephone Encounter (Signed)
Pts daughter requests a return call back regarding current UTI

## 2019-09-12 NOTE — Telephone Encounter (Signed)
Pts drt. called wanting to know what all was said and advised to the pts husband ( her stepfather) the last time we talked on the phone. I read back to her advice that was given and explained it to her again. She expressed understanding.

## 2019-09-13 ENCOUNTER — Telehealth: Payer: Self-pay | Admitting: Family Medicine

## 2019-09-13 NOTE — Telephone Encounter (Signed)
Patient's daughter Lattie Haw called to report they have been going back and forth between patients PCP and urologist since Tuesday of this week trying to get a urine collected through home health so that her UTI can be treated.  I do not see any documentation of contact with PCP office in the chart.  Advised daughter regardless of what has happened so far, patient needs a urine specimen collected prior to starting treatment with antibiotic so that it can be sent for a culture.  Advised her to take her to a local urgent care.  Patient's only symptom is feeling lethargic.

## 2019-09-15 ENCOUNTER — Encounter: Payer: Self-pay | Admitting: Family Medicine

## 2019-09-15 ENCOUNTER — Telehealth: Payer: Self-pay | Admitting: *Deleted

## 2019-09-15 NOTE — Telephone Encounter (Signed)
TC from [facility] They have tried collecting UA sample 2 times, pt collects at night and this has been too old to get to lab. Pt agrees if order is received to do an in & out cath to collect UA & CS. VO given

## 2019-09-16 ENCOUNTER — Telehealth: Payer: Self-pay | Admitting: Internal Medicine

## 2019-09-16 ENCOUNTER — Telehealth: Payer: Self-pay | Admitting: Family Medicine

## 2019-09-16 ENCOUNTER — Telehealth: Payer: Self-pay | Admitting: *Deleted

## 2019-09-16 NOTE — Telephone Encounter (Signed)
Pts daughter called to check status of order that Dr Warrick Parisian said he was going to place for pt to get external catheters for nighttime.

## 2019-09-16 NOTE — Telephone Encounter (Signed)
Patient's daughter called wanting to know if her mother still needs to be on lasix 20mg , if she does if her dosage can be lowered so she doesn't have to go to the bathroom as much.

## 2019-09-16 NOTE — Telephone Encounter (Signed)
TC w/ Renee from [facility] in [place] UA & CS results were faxed to office today, results on Britney's desk sensitivity part will not be back till tomorrow. OT visited patient today, she has barely urinated at all today Could we go ahead and put her on something to keep her out of the hospital She was on Keflex last for UTI in the hospital

## 2019-09-17 MED ORDER — CIPROFLOXACIN HCL 500 MG PO TABS: 500.0000 mg | ORAL_TABLET | Freq: Two times a day (BID) | ORAL | 0 refills | Status: DC

## 2019-09-17 NOTE — Telephone Encounter (Signed)
Order reprinted, will send to [facility], order will need to be signed by Dr. Warrick Parisian when he returns to the office on 09/22/19

## 2019-09-17 NOTE — Telephone Encounter (Signed)
Husband called and aware of cipro

## 2019-09-17 NOTE — Telephone Encounter (Signed)
Cipro Prescription sent to pharmacy   

## 2019-09-17 NOTE — Telephone Encounter (Signed)
Just FYI Daughter called today to clear some " issues up"  Per daughter, the patient was still having UTI issues. Daughter spoke with Hilda Blades who suggested having PCP have [place] Nurse obtain a urine culture sample. Daughter reports a urine culture was sent on 09/15/2019- results pending.   Daughter reports the pt husband states she DID complete all of the Keflex 500mg  that was prescribed after the husband reported  "bloody nose and bloody stool" on macrodantin. Daughter calling to update the chart to clear any antibiotic issues over what patient completed. Daughter feels like the Keflex did not clear up the UTI issues she was having in June. She stated the report of the urine culture will go back to her PCP since that is who she was told to get the order from. She wanted the correct antibiotic to on her chart for when new culture result is back and which medication she will need.

## 2019-09-17 NOTE — Addendum Note (Signed)
Addended by: Evelina Dun A on: 09/17/2019 09:26 AM   Modules accepted: Orders

## 2019-09-18 NOTE — Telephone Encounter (Signed)
Call returned to Pt's daughter.  Advised of documentation from phone note 08/31/2017.  Rediscussed Pt could take lasix PRN.  Advised to be aware of HF symptoms.  Advised weighing Pt daily would be best way to dose lasix.  All daughter's questions answered.

## 2019-09-20 ENCOUNTER — Emergency Department (HOSPITAL_COMMUNITY): Payer: Medicare Other

## 2019-09-20 ENCOUNTER — Encounter (HOSPITAL_COMMUNITY): Payer: Self-pay

## 2019-09-20 ENCOUNTER — Inpatient Hospital Stay (HOSPITAL_COMMUNITY)
Admission: EM | Admit: 2019-09-20 | Discharge: 2019-09-22 | DRG: 812 | Disposition: A | Discharge status: Home Health | Payer: Medicare Other | Source: Ambulatory Visit | Attending: Internal Medicine | Admitting: Internal Medicine

## 2019-09-20 DIAGNOSIS — I495 Sick sinus syndrome: Secondary | ICD-10-CM | POA: Diagnosis present

## 2019-09-20 DIAGNOSIS — Z950 Presence of cardiac pacemaker: Secondary | ICD-10-CM

## 2019-09-20 DIAGNOSIS — Z8249 Family history of ischemic heart disease and other diseases of the circulatory system: Secondary | ICD-10-CM

## 2019-09-20 DIAGNOSIS — I700 Atherosclerosis of aorta: Secondary | ICD-10-CM | POA: Diagnosis present

## 2019-09-20 DIAGNOSIS — K766 Portal hypertension: Secondary | ICD-10-CM | POA: Diagnosis present

## 2019-09-20 DIAGNOSIS — W19XXXA Unspecified fall, initial encounter: Secondary | ICD-10-CM

## 2019-09-20 DIAGNOSIS — S8011XA Contusion of right lower leg, initial encounter: Secondary | ICD-10-CM

## 2019-09-20 DIAGNOSIS — Z20822 Contact with and (suspected) exposure to covid-19: Secondary | ICD-10-CM | POA: Diagnosis present

## 2019-09-20 DIAGNOSIS — L409 Psoriasis, unspecified: Secondary | ICD-10-CM | POA: Diagnosis present

## 2019-09-20 DIAGNOSIS — I480 Paroxysmal atrial fibrillation: Secondary | ICD-10-CM | POA: Diagnosis present

## 2019-09-20 DIAGNOSIS — R531 Weakness: Secondary | ICD-10-CM

## 2019-09-20 DIAGNOSIS — I6930 Unspecified sequelae of cerebral infarction: Secondary | ICD-10-CM

## 2019-09-20 DIAGNOSIS — N390 Urinary tract infection, site not specified: Secondary | ICD-10-CM | POA: Diagnosis present

## 2019-09-20 DIAGNOSIS — I69351 Hemiplegia and hemiparesis following cerebral infarction affecting right dominant side: Secondary | ICD-10-CM

## 2019-09-20 DIAGNOSIS — I10 Essential (primary) hypertension: Secondary | ICD-10-CM

## 2019-09-20 DIAGNOSIS — D649 Anemia, unspecified: Secondary | ICD-10-CM

## 2019-09-20 DIAGNOSIS — T792XXA Traumatic secondary and recurrent hemorrhage and seroma, initial encounter: Secondary | ICD-10-CM | POA: Diagnosis not present

## 2019-09-20 DIAGNOSIS — S7001XA Contusion of right hip, initial encounter: Secondary | ICD-10-CM | POA: Diagnosis present

## 2019-09-20 DIAGNOSIS — Y92009 Unspecified place in unspecified non-institutional (private) residence as the place of occurrence of the external cause: Secondary | ICD-10-CM

## 2019-09-20 DIAGNOSIS — Z7901 Long term (current) use of anticoagulants: Secondary | ICD-10-CM

## 2019-09-20 DIAGNOSIS — Z833 Family history of diabetes mellitus: Secondary | ICD-10-CM

## 2019-09-20 DIAGNOSIS — G8311 Monoplegia of lower limb affecting right dominant side: Secondary | ICD-10-CM | POA: Diagnosis present

## 2019-09-20 DIAGNOSIS — D62 Acute posthemorrhagic anemia: Secondary | ICD-10-CM | POA: Diagnosis not present

## 2019-09-20 DIAGNOSIS — T45515A Adverse effect of anticoagulants, initial encounter: Secondary | ICD-10-CM | POA: Diagnosis present

## 2019-09-20 DIAGNOSIS — G40909 Epilepsy, unspecified, not intractable, without status epilepticus: Secondary | ICD-10-CM | POA: Diagnosis present

## 2019-09-20 DIAGNOSIS — E119 Type 2 diabetes mellitus without complications: Secondary | ICD-10-CM

## 2019-09-20 DIAGNOSIS — E1159 Type 2 diabetes mellitus with other circulatory complications: Secondary | ICD-10-CM

## 2019-09-20 DIAGNOSIS — T148XXA Other injury of unspecified body region, initial encounter: Secondary | ICD-10-CM | POA: Insufficient documentation

## 2019-09-20 DIAGNOSIS — R296 Repeated falls: Secondary | ICD-10-CM | POA: Diagnosis present

## 2019-09-20 DIAGNOSIS — E039 Hypothyroidism, unspecified: Secondary | ICD-10-CM | POA: Diagnosis not present

## 2019-09-20 DIAGNOSIS — I959 Hypotension, unspecified: Secondary | ICD-10-CM | POA: Diagnosis present

## 2019-09-20 DIAGNOSIS — F329 Major depressive disorder, single episode, unspecified: Secondary | ICD-10-CM | POA: Diagnosis present

## 2019-09-20 LAB — CBC WITH DIFFERENTIAL/PLATELET
Abs Immature Granulocytes: 0.06 10*3/uL (ref 0.00–0.07)
Basophils Absolute: 0.1 10*3/uL (ref 0.0–0.1)
Basophils Relative: 1 %
Eosinophils Absolute: 0.3 10*3/uL (ref 0.0–0.5)
Eosinophils Relative: 3 %
HCT: 36.2 % (ref 36.0–46.0)
Hemoglobin: 10.7 g/dL — ABNORMAL LOW (ref 12.0–15.0)
Immature Granulocytes: 1 %
Lymphocytes Relative: 30 %
Lymphs Abs: 3 10*3/uL (ref 0.7–4.0)
MCH: 32.4 pg (ref 26.0–34.0)
MCHC: 29.6 g/dL — ABNORMAL LOW (ref 30.0–36.0)
MCV: 109.7 fL — ABNORMAL HIGH (ref 80.0–100.0)
Monocytes Absolute: 0.9 10*3/uL (ref 0.1–1.0)
Monocytes Relative: 9 %
Neutro Abs: 5.4 10*3/uL (ref 1.7–7.7)
Neutrophils Relative %: 56 %
Platelets: 219 10*3/uL (ref 150–400)
RBC: 3.3 MIL/uL — ABNORMAL LOW (ref 3.87–5.11)
RDW: 18.2 % — ABNORMAL HIGH (ref 11.5–15.5)
WBC: 9.9 10*3/uL (ref 4.0–10.5)
nRBC: 0 % (ref 0.0–0.2)

## 2019-09-20 LAB — I-STAT CHEM 8, ED
BUN: 8 mg/dL (ref 8–23)
Calcium, Ion: 1.18 mmol/L (ref 1.15–1.40)
Chloride: 108 mmol/L (ref 98–111)
Creatinine, Ser: 0.4 mg/dL — ABNORMAL LOW (ref 0.44–1.00)
Glucose, Bld: 108 mg/dL — ABNORMAL HIGH (ref 70–99)
HCT: 37 % (ref 36.0–46.0)
Hemoglobin: 12.6 g/dL (ref 12.0–15.0)
Potassium: 4.2 mmol/L (ref 3.5–5.1)
Sodium: 146 mmol/L — ABNORMAL HIGH (ref 135–145)
TCO2: 18 mmol/L — ABNORMAL LOW (ref 22–32)

## 2019-09-20 LAB — COMPREHENSIVE METABOLIC PANEL
ALT: 18 U/L (ref 0–44)
AST: 51 U/L — ABNORMAL HIGH (ref 15–41)
Albumin: 3 g/dL — ABNORMAL LOW (ref 3.5–5.0)
Alkaline Phosphatase: 67 U/L (ref 38–126)
Anion gap: 15 (ref 5–15)
BUN: 8 mg/dL (ref 8–23)
CO2: 20 mmol/L — ABNORMAL LOW (ref 22–32)
Calcium: 9.1 mg/dL (ref 8.9–10.3)
Chloride: 110 mmol/L (ref 98–111)
Creatinine, Ser: 0.8 mg/dL (ref 0.44–1.00)
GFR calc Af Amer: >60 mL/min (ref >60)
GFR calc non Af Amer: >60 mL/min (ref >60)
Glucose, Bld: 118 mg/dL — ABNORMAL HIGH (ref 70–99)
Potassium: 4.3 mmol/L (ref 3.5–5.1)
Sodium: 145 mmol/L (ref 135–145)
Total Bilirubin: 1.9 mg/dL — ABNORMAL HIGH (ref 0.3–1.2)
Total Protein: 5.9 g/dL — ABNORMAL LOW (ref 6.5–8.1)

## 2019-09-20 LAB — LIPASE, BLOOD: Lipase: 34 U/L (ref 11–51)

## 2019-09-20 LAB — TROPONIN I (HIGH SENSITIVITY): Troponin I (High Sensitivity): 5 ng/L (ref <18)

## 2019-09-20 MED ORDER — IOHEXOL 300 MG/ML  SOLN
100.0000 mL | Freq: Once | INTRAMUSCULAR | Status: AC | PRN
  Administered 2019-09-20: 100 mL via INTRAVENOUS

## 2019-09-20 MED ORDER — KETOROLAC TROMETHAMINE 30 MG/ML IJ SOLN: 15.0000 mg | Freq: Once | INTRAMUSCULAR | Status: DC

## 2019-09-20 MED ORDER — KETOROLAC TROMETHAMINE 60 MG/2ML IM SOLN
15.0000 mg | Freq: Once | INTRAMUSCULAR | Status: AC
  Administered 2019-09-20: 15 mg via INTRAMUSCULAR
  Filled 2019-09-20: qty 2

## 2019-09-20 MED ORDER — OXYCODONE HCL 5 MG PO TABS
2.5000 mg | ORAL_TABLET | Freq: Once | ORAL | Status: AC
  Administered 2019-09-20: 2.5 mg via ORAL
  Filled 2019-09-20: qty 1

## 2019-09-20 MED ORDER — ACETAMINOPHEN 500 MG PO TABS
1000.0000 mg | ORAL_TABLET | Freq: Once | ORAL | Status: AC
  Administered 2019-09-20: 1000 mg via ORAL
  Filled 2019-09-20: qty 2

## 2019-09-20 NOTE — ED Provider Notes (Signed)
[facility] EMERGENCY DEPARTMENT Provider Note   CSN: [id number] Arrival date & time: 09/20/19  2100     History Chief Complaint  Patient presents with  . Fall    [name] is a 74 y.o. female.  74 yo F with a chief complaint of a fall.  Patient has been having frequent falls over the past few weeks.  Ended up falling today about 430.  Went to an urgent care center in [facility] and had a plain film of the right hip.  Was then told that she needed to come to the emergency department here in [facility] for further evaluation.  They told her that she had a hematoma but no fracture.  Patient has been having some significant difficulties with mobility after this fall.  Already has difficulty with right-sided weakness after a stroke.  Typically requires assistance in a gait belt.  It took reportedly 3 people to get her out of the car when she got here to [place].  She thinks that she hit her head when she fell.  Denies neck pain denies chest pain denies shortness of breath denies abdominal pain.  She is unsure if her right arm hurts but did think that it hurts on the initial injury.  She takes a blood thinner.  Has some mild right-sided abdominal pain after the fall.  Denies cough congestion or fevers denies urinary symptoms.  The history is provided by the patient.  Illness Severity:  Moderate Onset quality:  Gradual Duration:  2 weeks Timing:  Constant Progression:  Worsening Chronicity:  New Associated symptoms: myalgias   Associated symptoms: no chest pain, no congestion, no fever, no headaches, no nausea, no rhinorrhea, no shortness of breath, no vomiting and no wheezing        Past Medical History:  Diagnosis Date  . Anemia   . Bronchial spasms   . Chronic anxiety   . Chronic kidney disease   . Complication of anesthesia    " I shake real bad "  . Diverticulosis   . Elevated blood sugar   . Esophageal stricture   . Family history of  anesthesia complication    Daughter also shakes while waking up  . GERD (gastroesophageal reflux disease)   . H/O hiatal hernia   . H/O scarlet fever   . History of bladder repair surgery   . Hx of vaginal hysterectomy   . Hypertension   . Hypothyroidism   . IBS (irritable bowel syndrome)    vs diarrhea vs abd. fullness   . Impaired glucose tolerance   . Neuromuscular disorder ([place])    periferal neuropathy  . Pacemaker    St. Jude  . Paroxysmal atrial fibrillation (HCC)    a. failed flecainide, tikosyn, amio;  b. 01/2012 s/p RFCA.  . Psoriasis   . Psoriasis   . Rectocele, female   . Seizures ([place])   . Stroke ([place])   . Tachy-brady syndrome ([place])    a. 08/19/2008 s/p PPM: SJM 2110 Accent  . Uterine prolapse   . Vaginal prolapse 1998    Patient Active Problem List   Diagnosis Date Noted  . Multiple drug allergies 07/23/2019  . Paralysis of right lower extremity ([place]) 12/26/2018  . Aortic atherosclerosis ([place]) 04/27/2017  . Type 2 diabetes mellitus ([place]) 05/17/2015  . Late effects of cerebral ischemic stroke 11/24/2014  . Focal and partial seizures ([place]) 11/24/2014  . Depression 11/24/2014  . Metabolic syndrome 00/17/4944  . High risk  medication use 03/13/2013  . PAF (paroxysmal atrial fibrillation) ([place]) 02/07/2012  . Tachy-brady syndrome ([place])   . Cardiac pacemaker - Accent DR 2110  12/29/2010  . Fatigue 12/22/2009  . PPM-St.Jude 09/01/2008  . BRADYCARDIA 08/13/2008  . Hypothyroidism 06/09/2008  . HTN (hypertension) 06/09/2008  . PREMATURE VENTRICULAR CONTRACTIONS 06/09/2008  . IRRITABLE BOWEL SYNDROME 06/09/2008  . PSORIASIS 06/09/2008  . EDEMA 06/09/2008  . DIVERTICULOSIS, COLON, HX OF 06/09/2008    Past Surgical History:  Procedure Laterality Date  . ATRIAL FIBRILLATION ABLATION  02/06/12   PVI by Dr Rayann Heman  . ATRIAL FIBRILLATION ABLATION  07/04/2012   repeat PVI by Dr Rayann Heman  . ATRIAL FIBRILLATION ABLATION N/A 02/06/2012   Procedure: ATRIAL FIBRILLATION  ABLATION;  Surgeon: Thompson Grayer, MD;  Location: [facility] CATH LAB;  Service: Cardiovascular;  Laterality: N/A;  . ATRIAL FIBRILLATION ABLATION N/A 07/04/2012   Procedure: ATRIAL FIBRILLATION ABLATION;  Surgeon: Thompson Grayer, MD;  Location: [facility] CATH LAB;  Service: Cardiovascular;  Laterality: N/A;  . BLADDER SUSPENSION    . INSERT / REPLACE / REMOVE PACEMAKER     SJM  . LEFT HEART CATHETERIZATION WITH CORONARY ANGIOGRAM N/A 03/24/2013   Procedure: LEFT HEART CATHETERIZATION WITH CORONARY ANGIOGRAM;  Surgeon: Blane Ohara, MD;  Location: [facility] CATH LAB;  Service: Cardiovascular;  Laterality: N/A;  . RECTOCELE REPAIR    . TEE WITHOUT CARDIOVERSION  02/06/2012   Procedure: TRANSESOPHAGEAL ECHOCARDIOGRAM (TEE);  Surgeon: Thayer Headings, MD;  Location: [place];  Service: Cardiovascular;  Laterality: N/A;  . TEE WITHOUT CARDIOVERSION N/A 07/04/2012   Procedure: TRANSESOPHAGEAL ECHOCARDIOGRAM (TEE);  Surgeon: Lelon Perla, MD;  Location: [facility] ENDOSCOPY;  Service: Cardiovascular;  Laterality: N/A;  . TRANSTHORACIC ECHOCARDIOGRAM  2008  . VAGINAL HYSTERECTOMY     prolapse      OB History   No obstetric history on file.     Family History  Problem Relation Age of Onset  . Diabetes Mother   . Heart disease Mother   . Heart failure Mother   . Macular degeneration Mother   . Heart disease Father   . Breast cancer Sister   . Ovarian cancer Sister   . Uterine cancer Paternal Aunt   . Crohn's disease Other        neice  . Diabetes Maternal Grandmother   . Diabetes Sister   . Heart disease Brother   . Heart disease Brother   . Macular degeneration Brother   . Colon cancer Neg Hx   . Stomach cancer Neg Hx     Social History   Tobacco Use  . Smoking status: Never Smoker  . Smokeless tobacco: Never Used  Vaping Use  . Vaping Use: Never used  Substance Use Topics  . Alcohol use: No  . Drug use: No    Home Medications Prior to Admission medications   Medication Sig Start Date End  Date Taking? Authorizing Provider  acetaminophen (TYLENOL) 500 MG tablet Take 500 mg by mouth every 6 (six) hours as needed (pain).     [provider]  Ascorbic Acid (VITAMIN C PO) Take 1 tablet by mouth daily.    [provider]  Calcium Carbonate-Vitamin D (CALCIUM-VITAMIN D) 500-200 MG-UNIT per tablet Take 1 tablet by mouth daily.    [provider]  cephALEXin (KEFLEX) 500 MG capsule Take 1 capsule (500 mg total) by mouth 3 (three) times daily. 08/22/19   Irine Seal, MD  cholecalciferol (VITAMIN D) 1000 UNITS tablet Take 2,000 Units by  mouth daily.     [provider]  ciprofloxacin (CIPRO) 500 MG tablet Take 1 tablet (500 mg total) by mouth 2 (two) times daily. 09/17/19   Sharion Balloon, FNP  citalopram (CELEXA) 20 MG tablet Take 1 tablet (20 mg total) by mouth 2 (two) times daily. 05/01/19   Dettinger, Fransisca Kaufmann, MD  cyclobenzaprine (FLEXERIL) 5 MG tablet Take 1 tablet (5 mg total) by mouth 3 (three) times daily as needed for muscle spasms. 12/04/18   Terald Sleeper, PA-C  dicyclomine (BENTYL) 10 MG capsule TAKE 1 CAPSULE BY MOUTH FOUR TIMES DAILY, BEFORE MEALS AND AT BEDTIME 12/02/18   Dettinger, Fransisca Kaufmann, MD  Dulaglutide (TRULICITY) 2.53 GU/4.4IH SOPN Inject 0.75 mg into the skin once a week. 05/02/19   Dettinger, Fransisca Kaufmann, MD  fluticasone (FLONASE) 50 MCG/ACT nasal spray INSTILL 2 SPRAYS INTO EACH NOSTRIL EVERY DAY 06/02/19   Dettinger, Fransisca Kaufmann, MD  folic acid (FOLVITE) 1 MG tablet TAKE 1 TABLET(1 MG) BY MOUTH DAILY Patient taking differently: 2 mg.  11/26/15   Sharion Balloon, FNP  furosemide (LASIX) 20 MG tablet Take 1 tablet (20 mg total) by mouth daily. 05/01/19   Dettinger, Fransisca Kaufmann, MD  glucose blood (ONETOUCH ULTRA) test strip Test BS daily Dx E11.9 07/31/19   Dettinger, Fransisca Kaufmann, MD  KLOR-CON M20 20 MEQ tablet TAKE 2 TABLETS (40 MEQ TOTAL) BY MOUTH 2 (TWO) TIMES DAILY. 08/26/19   Dettinger, Fransisca Kaufmann, MD  levETIRAcetam (KEPPRA) 500 MG tablet TAKE 1  TABLET BY MOUTH TWICE A DAY 06/16/19   Frann Rider, NP  levothyroxine (SYNTHROID) 88 MCG tablet Take 1 tablet (88 mcg total) by mouth daily before breakfast. 05/01/19   Dettinger, Fransisca Kaufmann, MD  loperamide (IMODIUM) 2 MG capsule Take by mouth as needed for diarrhea or loose stools (Take as directed). Reported on 05/17/2015    [provider]  metFORMIN (GLUCOPHAGE-XR) 500 MG 24 hr tablet TAKE 2 TABLETS (1,000MG ) BY MOUTH TWICE A DAY AFTER A MEAL 07/28/19   Dettinger, Fransisca Kaufmann, MD  methotrexate (RHEUMATREX) 2.5 MG tablet Take 4 tablets (10 mg total) by mouth once a week. Caution:Chemotherapy. Protect from light. 05/01/19   Dettinger, Fransisca Kaufmann, MD  metoprolol tartrate (LOPRESSOR) 50 MG tablet Take 1 tablet (50 mg total) by mouth 2 (two) times daily. 05/01/19   Dettinger, Fransisca Kaufmann, MD  mirtazapine (REMERON) 15 MG tablet Take 1 tablet (15 mg total) by mouth at bedtime. 05/01/19   Dettinger, Fransisca Kaufmann, MD  Multiple Vitamins-Minerals (EYE VITAMINS PO) Take 2 tablets by mouth daily. AREDS formula    [provider]  nitrofurantoin (MACRODANTIN) 50 MG capsule Take 1 capsule (50 mg total) by mouth at bedtime. 08/18/19   Irine Seal, MD  nitrofurantoin, macrocrystal-monohydrate, (MACROBID) 100 MG capsule Take 1 capsule (100 mg total) by mouth every 12 (twelve) hours. 08/15/19   Irine Seal, MD  NYSTATIN powder APPLY TWICE DAILY UNDERNEATH BREASTS 11/13/16   Chipper Herb, MD  omeprazole (PRILOSEC) 40 MG capsule Take 1 capsule (40 mg total) by mouth daily. 05/01/19   Dettinger, Fransisca Kaufmann, MD  Probiotic Product (PROBIOTIC PO) Take 1 capsule by mouth daily.    [provider]  rivaroxaban (XARELTO) 20 MG TABS tablet Take 1 tablet (20 mg total) by mouth daily. 05/02/19   Dettinger, Fransisca Kaufmann, MD  simvastatin (ZOCOR) 40 MG tablet TAKE 1 TABLET BY MOUTH EVERY DAY 09/02/19   Dettinger, Fransisca Kaufmann, MD  traMADol (ULTRAM) 50 MG tablet Take  1 tablet (50 mg total) by mouth every 6 (six) hours as needed. Take  1/2 tab by mouth four times daily as needed for severe pain Patient taking differently: Take 25 mg by mouth 4 (four) times daily as needed.  12/31/18   Sharion Balloon, FNP    Allergies    Latex, Caudal tray [lidocaine-epinephrine], Codeine, Diovan [valsartan], Doxycycline, Esomeprazole magnesium, Levofloxacin, Myrbetriq [mirabegron], Neomycin-bacitracin zn-polymyx, Penicillins, Sulfa drugs cross reactors, Verapamil, and Vimovo [naproxen-esomeprazole]  Review of Systems   Review of Systems  Constitutional: Negative for chills and fever.  HENT: Negative for congestion and rhinorrhea.   Eyes: Negative for redness and visual disturbance.  Respiratory: Negative for shortness of breath and wheezing.   Cardiovascular: Negative for chest pain and palpitations.  Gastrointestinal: Negative for nausea and vomiting.  Genitourinary: Negative for dysuria and urgency.  Musculoskeletal: Positive for arthralgias, gait problem and myalgias.  Skin: Negative for pallor and wound.  Neurological: Negative for dizziness and headaches.    Physical Exam Updated Vital Signs BP (!) 130/61   Pulse 85   Resp 19   SpO2 100%   Physical Exam Vitals and nursing note reviewed.  Constitutional:      General: She is not in acute distress.    Appearance: She is well-developed. She is not diaphoretic.  HENT:     Head: Normocephalic and atraumatic.  Eyes:     Pupils: Pupils are equal, round, and reactive to light.  Cardiovascular:     Rate and Rhythm: Normal rate and regular rhythm.     Heart sounds: No murmur heard.  No friction rub. No gallop.   Pulmonary:     Effort: Pulmonary effort is normal.     Breath sounds: No wheezing or rales.  Abdominal:     General: There is no distension.     Palpations: Abdomen is soft.     Tenderness: There is no abdominal tenderness.     Comments: Large.  Reducible.  No significant abdominal tenderness.  No signs of bruising.  Musculoskeletal:        General: No  tenderness.     Cervical back: Normal range of motion and neck supple.     Comments: Hematoma along the right upper leg.  Skin:    General: Skin is warm and dry.  Neurological:     Mental Status: She is alert and oriented to person, place, and time.  Psychiatric:        Behavior: Behavior normal.     ED Results / Procedures / Treatments   Labs (all labs ordered are listed, but only abnormal results are displayed) Labs Reviewed  CBC WITH DIFFERENTIAL/PLATELET - Abnormal; Notable for the following components:      Result Value   RBC 3.30 (*)    Hemoglobin 10.7 (*)    MCV 109.7 (*)    MCHC 29.6 (*)    RDW 18.2 (*)    All other components within normal limits  COMPREHENSIVE METABOLIC PANEL - Abnormal; Notable for the following components:   CO2 20 (*)    Glucose, Bld 118 (*)    Total Protein 5.9 (*)    Albumin 3.0 (*)    AST 51 (*)    Total Bilirubin 1.9 (*)    All other components within normal limits  I-STAT CHEM 8, ED - Abnormal; Notable for the following components:   Sodium 146 (*)    Creatinine, Ser 0.40 (*)    Glucose, Bld 108 (*)    TCO2  18 (*)    All other components within normal limits  SARS CORONAVIRUS 2 BY RT PCR (HOSPITAL ORDER, [facility])  LIPASE, BLOOD  URINALYSIS, ROUTINE W REFLEX MICROSCOPIC  TROPONIN I (HIGH SENSITIVITY)    EKG EKG Interpretation  Date/Time:  Saturday September 20 2019 22:25:47 EDT Ventricular Rate:  85 PR Interval:    QRS Duration: 82 QT Interval:  380 QTC Calculation: 452 R Axis:   5 Text Interpretation: Sinus rhythm RSR' in V1 or V2, probably normal variant Borderline repolarization abnormality Baseline wander st depressions laterally seen on remote ekg Otherwise no significant change Confirmed by Deno Etienne [phone]) on 09/20/2019 10:34:18 PM   Radiology DG Chest 2 View  Result Date: 09/20/2019 CLINICAL DATA:  Fatigue and right shoulder pain EXAM: CHEST - 2 VIEW COMPARISON:  August 19, 2018 FINDINGS:  There is mild cardiomegaly. Aortic knob calcifications. A left-sided pacemaker is seen. Both lungs are clear. Tiny ossific densities are seen superior to the acromion which could be tiny chip fractures. IMPRESSION: No active cardiopulmonary disease. Possible tiny chip fractures of the superior acromion Electronically Signed   By: Prudencio Pair M.D.   On: 09/20/2019 22:58   DG Shoulder Right  Result Date: 09/20/2019 CLINICAL DATA:  Right shoulder pain and fall EXAM: RIGHT SHOULDER - 2+ VIEW COMPARISON:  None. FINDINGS: Tiny ossific densities are seen at the distal acromion, likely chip fractures. Overlying soft tissue swelling is seen. There is diffuse osteopenia. IMPRESSION: Tiny ossific density seen at the superior acromion which could represent tiny chip fractures Electronically Signed   By: Prudencio Pair M.D.   On: 09/20/2019 23:03   CT Head Wo Contrast  Result Date: 09/20/2019 CLINICAL DATA:  Recent fall while on blood thinners, initial encounter EXAM: CT HEAD WITHOUT CONTRAST TECHNIQUE: Contiguous axial images were obtained from the base of the skull through the vertex without intravenous contrast. COMPARISON:  None. FINDINGS: Brain: Changes consistent with prior left MCA infarct are noted. No findings to suggest acute hemorrhage, acute infarction or space-occupying mass lesion are seen. Vascular: No hyperdense vessel or unexpected calcification. Skull: Normal. Negative for fracture or focal lesion. Sinuses/Orbits: No acute finding. Other: None. IMPRESSION: Changes of prior left MCA infarct. No acute abnormality noted. Electronically Signed   By: Inez Catalina M.D.   On: 09/20/2019 21:55   CT ABDOMEN PELVIS W CONTRAST  Result Date: 09/20/2019 CLINICAL DATA:  Fall, on anticoagulation, right upper abdominal and hip pain EXAM: CT ABDOMEN AND PELVIS WITH CONTRAST TECHNIQUE: Multidetector CT imaging of the abdomen and pelvis was performed using the standard protocol following bolus administration of  intravenous contrast. CONTRAST:  12mL OMNIPAQUE IOHEXOL 300 MG/ML  SOLN COMPARISON:  CT 06/07/2012 FINDINGS: Lower chest: Atelectatic changes in the lung bases with some abundant subpleural fat in the posterior lung bases. Cardiac size is borderline enlarged with leads at the right atrium and cardiac apex. Left atrial appendage occlusion device is noted as well. No pericardial effusion or thickening. Hepatobiliary: Some mild heterogeneity of the liver, possibly attributable to contrast timing though could correlate with serologies. Mild nodular liver surface contour noted as well. No concerning focal liver abnormality. No direct hepatic injury or perihepatic hematoma. Prominent fold at the gallbladder neck. Gallbladder otherwise unremarkable without visible calcified gallstone. Mild prominence of the biliary tree is within normal limits with the common bile duct measuring up to 7 mm which is age-appropriate. No visible calcified intraductal gallstones. Pancreas: Mild pancreatic atrophy. No focal peripancreatic inflammation or  ductal dilatation Spleen: No direct splenic injury or perisplenic hematoma. Normal splenic size. No concerning splenic lesion Adrenals/Urinary Tract: No adrenal hemorrhage or suspicious adrenal lesions. No direct renal injury or perinephric hematoma. Kidneys enhance and excrete symmetrically. No concerning renal mass. No urolithiasis or hydronephrosis. Urinary bladder is largely decompressed at the time of exam and therefore poorly evaluated by CT imaging. Evidence of prior urethral sling. Stomach/Bowel: Prominent paraesophageal venous collaterals and varices are noted. Stomach is unremarkable. Paired air-filled duodenal diverticula are present, more proximal measuring 1.9 cm in size, more distal measuring up to 4 cm in size (coronal 7/53). Hyperattenuating focus within the large diverticulum is nonspecific, possibly ingested material. Distal duodenum is unremarkable with a normal course  across the midline abdomen. No small bowel thickening or dilatation. No colonic dilatation or wall thickening. Scattered colonic diverticula without focal inflammation to suggest diverticulitis. Vascular/Lymphatic: Atherosclerotic calcifications within the abdominal aorta and branch vessels. No aneurysm or ectasia. No enlarged abdominopelvic lymph nodes.Focal region of mid mesenteric hazy stranding with numerous reactive appearing clustered mid mesenteric lymph nodes compatible with mesenteritis (3/55), new from prior. Reproductive: Uterus is surgically absent. No concerning adnexal lesions. Other: Large fat containing paraumbilical hernia with a 5.5 x 3.8 cm fascial defect (transverse by craniocaudal). No evidence of strangulation of the fatty internal contents. No bowel containing hernias. Musculoskeletal: Extensive right lateral hip swelling and contusive change with heterogeneous, intermediate attenuation hematoma over the lateral hip, incompletely included within the level of imaging given partial collimation of the lateral soft tissues. Best seen on sagittal reconstructions measuring up to 11 cm in length, and up to 4.9 cm AP by greater than 3.5 cm transverse. No acute osseous injuries are evident. Multilevel degenerative changes are present in the imaged portions of the spine. Additional degenerative changes in the hips and pelvis surgical anchors noted at the pubic bodies. Benign bone island in the right femoral neck. IMPRESSION: 1. Extensive right lateral hip swelling and contusive change with heterogeneous, intermediate attenuation collection over the lateral hip, incompletely included within the level of imaging given partial collimation of the lateral soft tissues. Best seen on sagittal reconstructions measuring up to 11 cm in length, and up to 4.9 cm AP by greater than 3.5 cm transverse. Could reflect a simple hematoma or Sherry Ruffing type injury given size and location. No acute osseous injuries are  evident. 2. No acute osseous injury of the included lumbar spine or bony pelvis. 3. Focal region of mid mesenteric hazy stranding with numerous reactive appearing clustered mid mesenteric lymph nodes compatible with sequela of mesenteritis, new from prior. 4. Hepatic heterogeneity and nodular liver surface contour, could reflect intrinsic liver disease/cirrhosis. Correlate with serology. 5. Prominent paraesophageal venous collaterals and varices are noted. Suggestive of portal hypertension. 6. Large fat containing paraumbilical hernia with a 5.5 x 3.8 cm fascial defect (transverse by craniocaudal). No evidence of strangulation of the fatty internal contents. 7. Colonic diverticulosis without evidence of diverticulitis. 8. Evidence of prior urethral sling. 9. Aortic Atherosclerosis (ICD10-I70.0). Electronically Signed   By: Lovena Le M.D.   On: 09/20/2019 22:27    Procedures Procedures (including critical care time)  Medications Ordered in ED Medications  iohexol (OMNIPAQUE) 300 MG/ML solution 100 mL (100 mLs Intravenous Contrast Given 09/20/19 2205)  acetaminophen (TYLENOL) tablet 1,000 mg (1,000 mg Oral Given 09/20/19 2256)  oxyCODONE (Oxy IR/ROXICODONE) immediate release tablet 2.5 mg (2.5 mg Oral Given 09/20/19 2256)  ketorolac (TORADOL) injection 15 mg (15 mg Intramuscular Given 09/20/19 2256)  ED Course  I have reviewed the triage vital signs and the nursing notes.  Pertinent labs & imaging results that were available during my care of the patient were reviewed by me and considered in my medical decision making (see chart for details).    MDM Rules/Calculators/A&P                          74 yo F with a chief complaint of difficulty with ambulation.Worsening.  Family is having trouble taking care of her at home.  Was I am not sure after discussion with the family why this facility in particular was chosen.  Will obtain a CT scan of the head.  CT scan of the abdomen pelvis.  Chest x-ray  urine lab work reassess.  CT scan of the abdomen pelvis with read of a possible Morel Lavallee lesion.  I discussed this with Dr. Lyla Glassing, orthopedics.  He felt this is unlikely to be something that we evaluated and emergent surgical intervention.  He did feel that this would likely be very painful and based on the presentation we felt that it would warrant admission by the hospitalist service with likely PT requiring placement.  Recommended holding the patient's blood thinner and if the hematoma would rapidly expand to notify orthopedics.   The patients results and plan were reviewed and discussed.   Any x-rays performed were independently reviewed by myself.   Differential diagnosis were considered with the presenting HPI.  Medications  iohexol (OMNIPAQUE) 300 MG/ML solution 100 mL (100 mLs Intravenous Contrast Given 09/20/19 2205)  acetaminophen (TYLENOL) tablet 1,000 mg (1,000 mg Oral Given 09/20/19 2256)  oxyCODONE (Oxy IR/ROXICODONE) immediate release tablet 2.5 mg (2.5 mg Oral Given 09/20/19 2256)  ketorolac (TORADOL) injection 15 mg (15 mg Intramuscular Given 09/20/19 2256)    Vitals:   09/20/19 2109 09/20/19 2230  BP: (!) 133/48 (!) 130/61  Pulse: 87 85  Resp: 16 19  SpO2: 100% 100%    Final diagnoses:  Fall, initial encounter  Leg hematoma, right, initial encounter    Admission/ observation were discussed with the admitting physician, patient and/or family and they are comfortable with the plan.   Final Clinical Impression(s) / ED Diagnoses Final diagnoses:  Fall, initial encounter  Leg hematoma, right, initial encounter    Rx / DC Orders ED Discharge Orders    None       Deno Etienne, DO 09/20/19 2334

## 2019-09-20 NOTE — ED Triage Notes (Signed)
Pt arrives to ED w/ c/o fall on blood thinners. Pt had a mechanical fall around 1630 today, takes xeralto. Pt coming sent from UC in [place] after getting hip x-rays, although pt and family unsure of results. Pt has disc with her. Pt AOx4, neuro intact.

## 2019-09-20 NOTE — H&P (Signed)
History and Physical    [name] BSW:[id number] DOB: December 09, 1945 DOA: 09/20/2019  PCP: Dettinger, Fransisca Kaufmann, MD  Patient coming from: Home  I have personally briefly reviewed patient's old medical records in [place]  Chief Complaint: Fall  HPI: [name] is a 74 y.o. female with medical history significant of hypertension, type 2 diabetes, history of left CVA with resultant right-sided weakness, paroxysmal A. fib on Eliquis, hypothyroidism, psoriasis on methotrexate who presents having fallen today at home at approximately 1430.  The patient has residual left arm weakness and left leg weakness and uses a gait belt when trying to get around.  Her husband was gone to the store she got up by herself and fell to the right.  Her husband had her in the shower and noted a very large hematoma on her hip and so brought her initially to an urgent care and then was recommended to come to the ED.  The patient's from [facility] and they decided to come to [place] for care. ED Course: In the ED she was noted to have a negative CT of the head (with exception of her previous left CVA), and extensive right contusion about the hip which by CT measured 11 x 5 x 3.5.  Additionally there was mesenteric stranding and reactive lymph nodes compatible with mesenteritis.  Hepatic nodularity which might be consistent with liver cirrhosis, prominent paraesophageal venous collaterals and varices noted consistent with portal hypertension, large fat periumbilical hernia, colonic diverticulosis, aortic atherosclerosis.  Chest x-ray and right shoulder x-ray showed possible tiny chip fractures of the right acromium.  Orthopedics was consulted and they recommended observation for possible expansion of her right hip hematoma, physical therapy eval.  Review of Systems: As per HPI otherwise 10 point review of systems negative.   Past Medical History:  Diagnosis Date  . Anemia   . Bronchial spasms   .  Chronic anxiety   . Chronic kidney disease   . Complication of anesthesia    " I shake real bad "  . Diverticulosis   . Elevated blood sugar   . Esophageal stricture   . Family history of anesthesia complication    Daughter also shakes while waking up  . GERD (gastroesophageal reflux disease)   . H/O hiatal hernia   . H/O scarlet fever   . History of bladder repair surgery   . Hx of vaginal hysterectomy   . Hypertension   . Hypothyroidism   . IBS (irritable bowel syndrome)    vs diarrhea vs abd. fullness   . Impaired glucose tolerance   . Neuromuscular disorder ([place])    periferal neuropathy  . Pacemaker    St. Jude  . Paroxysmal atrial fibrillation (HCC)    a. failed flecainide, tikosyn, amio;  b. 01/2012 s/p RFCA.  . Psoriasis   . Psoriasis   . Rectocele, female   . Seizures ([place])   . Stroke ([place])   . Tachy-brady syndrome ([place])    a. 08/19/2008 s/p PPM: SJM 2110 Accent  . Uterine prolapse   . Vaginal prolapse 1998    Past Surgical History:  Procedure Laterality Date  . ATRIAL FIBRILLATION ABLATION  02/06/12   PVI by Dr Rayann Heman  . ATRIAL FIBRILLATION ABLATION  07/04/2012   repeat PVI by Dr Rayann Heman  . ATRIAL FIBRILLATION ABLATION N/A 02/06/2012   Procedure: ATRIAL FIBRILLATION ABLATION;  Surgeon: Thompson Grayer, MD;  Location: [facility] CATH LAB;  Service: Cardiovascular;  Laterality: N/A;  . ATRIAL  FIBRILLATION ABLATION N/A 07/04/2012   Procedure: ATRIAL FIBRILLATION ABLATION;  Surgeon: Thompson Grayer, MD;  Location: [facility] CATH LAB;  Service: Cardiovascular;  Laterality: N/A;  . BLADDER SUSPENSION    . INSERT / REPLACE / REMOVE PACEMAKER     SJM  . LEFT HEART CATHETERIZATION WITH CORONARY ANGIOGRAM N/A 03/24/2013   Procedure: LEFT HEART CATHETERIZATION WITH CORONARY ANGIOGRAM;  Surgeon: Blane Ohara, MD;  Location: [facility] CATH LAB;  Service: Cardiovascular;  Laterality: N/A;  . RECTOCELE REPAIR    . TEE WITHOUT CARDIOVERSION  02/06/2012   Procedure: TRANSESOPHAGEAL ECHOCARDIOGRAM  (TEE);  Surgeon: Thayer Headings, MD;  Location: [place];  Service: Cardiovascular;  Laterality: N/A;  . TEE WITHOUT CARDIOVERSION N/A 07/04/2012   Procedure: TRANSESOPHAGEAL ECHOCARDIOGRAM (TEE);  Surgeon: Lelon Perla, MD;  Location: [facility] ENDOSCOPY;  Service: Cardiovascular;  Laterality: N/A;  . TRANSTHORACIC ECHOCARDIOGRAM  2008  . VAGINAL HYSTERECTOMY     prolapse      reports that she has never smoked. She has never used smokeless tobacco. She reports that she does not drink alcohol and does not use drugs.  Allergies  Allergen Reactions  . Latex Rash  . Caudal Tray [Lidocaine-Epinephrine]     Edema    . Codeine Nausea Only  . Diovan [Valsartan] Itching  . Doxycycline Other (See Comments)    Might have caused heart to race per pt - not sure  . Esomeprazole Magnesium     Headache    . Levofloxacin     Insomnia    . Myrbetriq [Mirabegron] Diarrhea  . Neomycin-Bacitracin Zn-Polymyx     rash  . Penicillins     hives  . Sulfa Drugs Cross Reactors Other (See Comments)    Pt not sure what reaction was  . Verapamil     Edema   . Vimovo [Naproxen-Esomeprazole]     Upset stomach     Family History  Problem Relation Age of Onset  . Diabetes Mother   . Heart disease Mother   . Heart failure Mother   . Macular degeneration Mother   . Heart disease Father   . Breast cancer Sister   . Ovarian cancer Sister   . Uterine cancer Paternal Aunt   . Crohn's disease Other        neice  . Diabetes Maternal Grandmother   . Diabetes Sister   . Heart disease Brother   . Heart disease Brother   . Macular degeneration Brother   . Colon cancer Neg Hx   . Stomach cancer Neg Hx      Prior to Admission medications   Medication Sig Start Date End Date Taking? Authorizing Provider  acetaminophen (TYLENOL) 500 MG tablet Take 500 mg by mouth every 6 (six) hours as needed (pain).     [provider]  Ascorbic Acid (VITAMIN C PO) Take 1 tablet by mouth daily.     [provider]  Calcium Carbonate-Vitamin D (CALCIUM-VITAMIN D) 500-200 MG-UNIT per tablet Take 1 tablet by mouth daily.    [provider]  cephALEXin (KEFLEX) 500 MG capsule Take 1 capsule (500 mg total) by mouth 3 (three) times daily. 08/22/19   Irine Seal, MD  cholecalciferol (VITAMIN D) 1000 UNITS tablet Take 2,000 Units by mouth daily.     [provider]  ciprofloxacin (CIPRO) 500 MG tablet Take 1 tablet (500 mg total) by mouth 2 (two) times daily. 09/17/19   Sharion Balloon, FNP  citalopram (CELEXA) 20 MG tablet Take 1  tablet (20 mg total) by mouth 2 (two) times daily. 05/01/19   Dettinger, Fransisca Kaufmann, MD  cyclobenzaprine (FLEXERIL) 5 MG tablet Take 1 tablet (5 mg total) by mouth 3 (three) times daily as needed for muscle spasms. 12/04/18   Terald Sleeper, PA-C  dicyclomine (BENTYL) 10 MG capsule TAKE 1 CAPSULE BY MOUTH FOUR TIMES DAILY, BEFORE MEALS AND AT BEDTIME 12/02/18   Dettinger, Fransisca Kaufmann, MD  Dulaglutide (TRULICITY) 8.93 YB/0.1BP SOPN Inject 0.75 mg into the skin once a week. 05/02/19   Dettinger, Fransisca Kaufmann, MD  fluticasone (FLONASE) 50 MCG/ACT nasal spray INSTILL 2 SPRAYS INTO EACH NOSTRIL EVERY DAY 06/02/19   Dettinger, Fransisca Kaufmann, MD  folic acid (FOLVITE) 1 MG tablet TAKE 1 TABLET(1 MG) BY MOUTH DAILY Patient taking differently: 2 mg.  11/26/15   Sharion Balloon, FNP  furosemide (LASIX) 20 MG tablet Take 1 tablet (20 mg total) by mouth daily. 05/01/19   Dettinger, Fransisca Kaufmann, MD  glucose blood (ONETOUCH ULTRA) test strip Test BS daily Dx E11.9 07/31/19   Dettinger, Fransisca Kaufmann, MD  KLOR-CON M20 20 MEQ tablet TAKE 2 TABLETS (40 MEQ TOTAL) BY MOUTH 2 (TWO) TIMES DAILY. 08/26/19   Dettinger, Fransisca Kaufmann, MD  levETIRAcetam (KEPPRA) 500 MG tablet TAKE 1 TABLET BY MOUTH TWICE A DAY 06/16/19   Frann Rider, NP  levothyroxine (SYNTHROID) 88 MCG tablet Take 1 tablet (88 mcg total) by mouth daily before breakfast. 05/01/19   Dettinger, Fransisca Kaufmann, MD  loperamide (IMODIUM) 2 MG capsule  Take by mouth as needed for diarrhea or loose stools (Take as directed). Reported on 05/17/2015    [provider]  metFORMIN (GLUCOPHAGE-XR) 500 MG 24 hr tablet TAKE 2 TABLETS (1,000MG ) BY MOUTH TWICE A DAY AFTER A MEAL 07/28/19   Dettinger, Fransisca Kaufmann, MD  methotrexate (RHEUMATREX) 2.5 MG tablet Take 4 tablets (10 mg total) by mouth once a week. Caution:Chemotherapy. Protect from light. 05/01/19   Dettinger, Fransisca Kaufmann, MD  metoprolol tartrate (LOPRESSOR) 50 MG tablet Take 1 tablet (50 mg total) by mouth 2 (two) times daily. 05/01/19   Dettinger, Fransisca Kaufmann, MD  mirtazapine (REMERON) 15 MG tablet Take 1 tablet (15 mg total) by mouth at bedtime. 05/01/19   Dettinger, Fransisca Kaufmann, MD  Multiple Vitamins-Minerals (EYE VITAMINS PO) Take 2 tablets by mouth daily. AREDS formula    [provider]  nitrofurantoin (MACRODANTIN) 50 MG capsule Take 1 capsule (50 mg total) by mouth at bedtime. 08/18/19   Irine Seal, MD  nitrofurantoin, macrocrystal-monohydrate, (MACROBID) 100 MG capsule Take 1 capsule (100 mg total) by mouth every 12 (twelve) hours. 08/15/19   Irine Seal, MD  NYSTATIN powder APPLY TWICE DAILY UNDERNEATH BREASTS 11/13/16   Chipper Herb, MD  omeprazole (PRILOSEC) 40 MG capsule Take 1 capsule (40 mg total) by mouth daily. 05/01/19   Dettinger, Fransisca Kaufmann, MD  Probiotic Product (PROBIOTIC PO) Take 1 capsule by mouth daily.    [provider]  rivaroxaban (XARELTO) 20 MG TABS tablet Take 1 tablet (20 mg total) by mouth daily. 05/02/19   Dettinger, Fransisca Kaufmann, MD  simvastatin (ZOCOR) 40 MG tablet TAKE 1 TABLET BY MOUTH EVERY DAY 09/02/19   Dettinger, Fransisca Kaufmann, MD  traMADol (ULTRAM) 50 MG tablet Take 1 tablet (50 mg total) by mouth every 6 (six) hours as needed. Take 1/2 tab by mouth four times daily as needed for severe pain Patient taking differently: Take 25 mg by mouth 4 (four) times daily as needed.  12/31/18  Sharion Balloon, FNP    Physical Exam: Vitals:   09/20/19 2109 09/20/19  2230  BP: (!) 133/48 (!) 130/61  Pulse: 87 85  Resp: 16 19  SpO2: 100% 100%    Constitutional: NAD, calm, comfortable, she nods and smiles and interacts but her husband did most of the talking for her Eyes: PERRL, lids and conjunctivae normal ENMT: Mucous membranes are moist. Posterior pharynx clear of any exudate or lesions.Normal dentition.  Neck: normal, supple, no masses, no thyromegaly Respiratory: clear to auscultation bilaterally, no wheezing, no crackles. Normal respiratory effort. No accessory muscle use.  Cardiovascular: Regular rate and rhythm, no murmurs / rubs / gallops. No extremity edema. 2+ pedal pulses. No carotid bruits.  Abdomen: no tenderness, no masses palpated. No hepatosplenomegaly. Bowel sounds positive.  Musculoskeletal: no clubbing / cyanosis. No joint deformity upper and lower extremities. Good ROM, right hand contracture Normal muscle tone.  Skin: no rashes, ulcers.  Numerous ecchymoses, large firm hematoma noted on the right hip. Media Information   Media Information    Neurologic: Right hemiparesis, right hand is clenched Psychiatric: Normal judgment and insight. Alert and oriented x 3. Normal mood.   Labs on Admission: I have personally reviewed following labs and imaging studies  CBC: Recent Labs  Lab 09/20/19 2129 09/20/19 2139  WBC 9.9  --   NEUTROABS 5.4  --   HGB 10.7* 12.6  HCT 36.2 37.0  MCV 109.7*  --   PLT 219  --    Basic Metabolic Panel: Recent Labs  Lab 09/20/19 2129 09/20/19 2139  NA 145 146*  K 4.3 4.2  CL 110 108  CO2 20*  --   GLUCOSE 118* 108*  BUN 8 8  CREATININE 0.80 0.40*  CALCIUM 9.1  --    GFR: CrCl cannot be calculated (Unknown ideal weight.). Liver Function Tests: Recent Labs  Lab 09/20/19 2129  AST 51*  ALT 18  ALKPHOS 67  BILITOT 1.9*  PROT 5.9*  ALBUMIN 3.0*   Recent Labs  Lab 09/20/19 2129  LIPASE 34   Urine analysis:    Component Value Date/Time   COLORURINE YELLOW 08/15/2019 1501    APPEARANCEUR HAZY (A) 08/15/2019 1501   APPEARANCEUR Cloudy (A) 07/22/2019 1505   LABSPEC 1.016 08/15/2019 1501   PHURINE 5.0 08/15/2019 1501   GLUCOSEU NEGATIVE 08/15/2019 1501   GLUCOSEU NEGATIVE 05/28/2012 1042   HGBUR MODERATE (A) 08/15/2019 1501   BILIRUBINUR NEGATIVE 08/15/2019 1501   BILIRUBINUR neg 08/15/2019 1159   BILIRUBINUR Negative 07/22/2019 1505   KETONESUR 5 (A) 08/15/2019 1501   PROTEINUR 30 (A) 08/15/2019 1501   PROTEINUR Positive (A) 08/15/2019 1159   UROBILINOGEN negative (A) 08/15/2019 1159   UROBILINOGEN 0.2 05/28/2012 1042   NITRITE NEGATIVE 08/15/2019 1501   NITRITE pos 08/15/2019 1159   LEUKOCYTESUR LARGE (A) 08/15/2019 1501   LEUKOCYTESUR Large (3+) (A) 08/15/2019 1159    Radiological Exams on Admission: DG Chest 2 View  Result Date: 09/20/2019 CLINICAL DATA:  Fatigue and right shoulder pain EXAM: CHEST - 2 VIEW COMPARISON:  August 19, 2018 FINDINGS: There is mild cardiomegaly. Aortic knob calcifications. A left-sided pacemaker is seen. Both lungs are clear. Tiny ossific densities are seen superior to the acromion which could be tiny chip fractures. IMPRESSION: No active cardiopulmonary disease. Possible tiny chip fractures of the superior acromion Electronically Signed   By: Prudencio Pair M.D.   On: 09/20/2019 22:58   DG Shoulder Right  Result Date: 09/20/2019 CLINICAL DATA:  Right shoulder  pain and fall EXAM: RIGHT SHOULDER - 2+ VIEW COMPARISON:  None. FINDINGS: Tiny ossific densities are seen at the distal acromion, likely chip fractures. Overlying soft tissue swelling is seen. There is diffuse osteopenia. IMPRESSION: Tiny ossific density seen at the superior acromion which could represent tiny chip fractures Electronically Signed   By: Prudencio Pair M.D.   On: 09/20/2019 23:03   CT Head Wo Contrast  Result Date: 09/20/2019 CLINICAL DATA:  Recent fall while on blood thinners, initial encounter EXAM: CT HEAD WITHOUT CONTRAST TECHNIQUE: Contiguous axial images  were obtained from the base of the skull through the vertex without intravenous contrast. COMPARISON:  None. FINDINGS: Brain: Changes consistent with prior left MCA infarct are noted. No findings to suggest acute hemorrhage, acute infarction or space-occupying mass lesion are seen. Vascular: No hyperdense vessel or unexpected calcification. Skull: Normal. Negative for fracture or focal lesion. Sinuses/Orbits: No acute finding. Other: None. IMPRESSION: Changes of prior left MCA infarct. No acute abnormality noted. Electronically Signed   By: Inez Catalina M.D.   On: 09/20/2019 21:55   CT ABDOMEN PELVIS W CONTRAST  Result Date: 09/20/2019 CLINICAL DATA:  Fall, on anticoagulation, right upper abdominal and hip pain EXAM: CT ABDOMEN AND PELVIS WITH CONTRAST TECHNIQUE: Multidetector CT imaging of the abdomen and pelvis was performed using the standard protocol following bolus administration of intravenous contrast. CONTRAST:  172mL OMNIPAQUE IOHEXOL 300 MG/ML  SOLN COMPARISON:  CT 06/07/2012 FINDINGS: Lower chest: Atelectatic changes in the lung bases with some abundant subpleural fat in the posterior lung bases. Cardiac size is borderline enlarged with leads at the right atrium and cardiac apex. Left atrial appendage occlusion device is noted as well. No pericardial effusion or thickening. Hepatobiliary: Some mild heterogeneity of the liver, possibly attributable to contrast timing though could correlate with serologies. Mild nodular liver surface contour noted as well. No concerning focal liver abnormality. No direct hepatic injury or perihepatic hematoma. Prominent fold at the gallbladder neck. Gallbladder otherwise unremarkable without visible calcified gallstone. Mild prominence of the biliary tree is within normal limits with the common bile duct measuring up to 7 mm which is age-appropriate. No visible calcified intraductal gallstones. Pancreas: Mild pancreatic atrophy. No focal peripancreatic inflammation or  ductal dilatation Spleen: No direct splenic injury or perisplenic hematoma. Normal splenic size. No concerning splenic lesion Adrenals/Urinary Tract: No adrenal hemorrhage or suspicious adrenal lesions. No direct renal injury or perinephric hematoma. Kidneys enhance and excrete symmetrically. No concerning renal mass. No urolithiasis or hydronephrosis. Urinary bladder is largely decompressed at the time of exam and therefore poorly evaluated by CT imaging. Evidence of prior urethral sling. Stomach/Bowel: Prominent paraesophageal venous collaterals and varices are noted. Stomach is unremarkable. Paired air-filled duodenal diverticula are present, more proximal measuring 1.9 cm in size, more distal measuring up to 4 cm in size (coronal 7/53). Hyperattenuating focus within the large diverticulum is nonspecific, possibly ingested material. Distal duodenum is unremarkable with a normal course across the midline abdomen. No small bowel thickening or dilatation. No colonic dilatation or wall thickening. Scattered colonic diverticula without focal inflammation to suggest diverticulitis. Vascular/Lymphatic: Atherosclerotic calcifications within the abdominal aorta and branch vessels. No aneurysm or ectasia. No enlarged abdominopelvic lymph nodes.Focal region of mid mesenteric hazy stranding with numerous reactive appearing clustered mid mesenteric lymph nodes compatible with mesenteritis (3/55), new from prior. Reproductive: Uterus is surgically absent. No concerning adnexal lesions. Other: Large fat containing paraumbilical hernia with a 5.5 x 3.8 cm fascial defect (transverse by craniocaudal). No evidence of  strangulation of the fatty internal contents. No bowel containing hernias. Musculoskeletal: Extensive right lateral hip swelling and contusive change with heterogeneous, intermediate attenuation hematoma over the lateral hip, incompletely included within the level of imaging given partial collimation of the lateral  soft tissues. Best seen on sagittal reconstructions measuring up to 11 cm in length, and up to 4.9 cm AP by greater than 3.5 cm transverse. No acute osseous injuries are evident. Multilevel degenerative changes are present in the imaged portions of the spine. Additional degenerative changes in the hips and pelvis surgical anchors noted at the pubic bodies. Benign bone island in the right femoral neck. IMPRESSION: 1. Extensive right lateral hip swelling and contusive change with heterogeneous, intermediate attenuation collection over the lateral hip, incompletely included within the level of imaging given partial collimation of the lateral soft tissues. Best seen on sagittal reconstructions measuring up to 11 cm in length, and up to 4.9 cm AP by greater than 3.5 cm transverse. Could reflect a simple hematoma or Sherry Ruffing type injury given size and location. No acute osseous injuries are evident. 2. No acute osseous injury of the included lumbar spine or bony pelvis. 3. Focal region of mid mesenteric hazy stranding with numerous reactive appearing clustered mid mesenteric lymph nodes compatible with sequela of mesenteritis, new from prior. 4. Hepatic heterogeneity and nodular liver surface contour, could reflect intrinsic liver disease/cirrhosis. Correlate with serology. 5. Prominent paraesophageal venous collaterals and varices are noted. Suggestive of portal hypertension. 6. Large fat containing paraumbilical hernia with a 5.5 x 3.8 cm fascial defect (transverse by craniocaudal). No evidence of strangulation of the fatty internal contents. 7. Colonic diverticulosis without evidence of diverticulitis. 8. Evidence of prior urethral sling. 9. Aortic Atherosclerosis (ICD10-I70.0). Electronically Signed   By: Lovena Le M.D.   On: 09/20/2019 22:27    EKG: Independently reviewed.  Sinus rhythm, RSR prime in V1 and V2, borderline repolarization abnormality, wandering baseline  Assessment/Plan Principal  Problem:   Sherry Ruffing lesion Active Problems:   Hypothyroidism   HTN (hypertension)   PAF (paroxysmal atrial fibrillation) (HCC)   Tachy-brady syndrome (HCC)   Late effects of cerebral ischemic stroke   Type 2 diabetes mellitus (HCC)   Aortic atherosclerosis (HCC)   Paralysis of right lower extremity ([place])   Fall as cause of accidental injury at home as place of occurrence   Portal hypertension ([place])   Anemia   Status post fall Has home PT already Stroke was approximately 7 years ago so has been coping with this for some time  Sherry Ruffing hematoma/lesion of the right hip Very large probably related to ongoing Eliquis use No evidence of fracture Will have nursing demarcate the borders of this and monitor Repeat CBC in the morning  History of left CVA with right hemiparesis  Hypertension On Lopressor 50 twice daily  Hypothyroid Continue Synthroid  UTI This is an ongoing issue for her primary care physician has called in this medication for her and she started on 728 Continue ciprofloxacin  Depression On Celexa  Psoriasis On methotrexate, on hold for admission  Paroxysmal A. fib on Eliquis Holding Eliquis for now  Type 2 diabetes On Trulicity at home weekly Metformin Sliding scale insulin  Portal hypertension Her AST is mildly elevated as well at 51 No history of this prior was seen on CT scan Can be followed up as an outpatient  Acute blood loss anemia Previous hemoglobin was 13.3 on 08/06/2019 Repeat in the morning  Seizure disorder On Keppra  DVT prophylaxis: SCD/Compression stockings usually on Eliquis this is on hold given hematoma Code Status: Full code confirmed with patient and husband Family Communication: Patient and husband at bedside Disposition Plan: Home Consults called: Ortho Admission status: Observation with PT consult in a.m. oxycodone for severe pain.   Donnamae Jude MD [facility]  If 7PM-7AM, please contact  night-coverage 09/20/2019, 11:51 PM

## 2019-09-21 ENCOUNTER — Other Ambulatory Visit: Payer: Self-pay

## 2019-09-21 DIAGNOSIS — T45515A Adverse effect of anticoagulants, initial encounter: Secondary | ICD-10-CM | POA: Diagnosis present

## 2019-09-21 DIAGNOSIS — Z8249 Family history of ischemic heart disease and other diseases of the circulatory system: Secondary | ICD-10-CM | POA: Diagnosis not present

## 2019-09-21 DIAGNOSIS — N390 Urinary tract infection, site not specified: Secondary | ICD-10-CM | POA: Diagnosis present

## 2019-09-21 DIAGNOSIS — W19XXXA Unspecified fall, initial encounter: Secondary | ICD-10-CM | POA: Diagnosis present

## 2019-09-21 DIAGNOSIS — S7001XA Contusion of right hip, initial encounter: Secondary | ICD-10-CM | POA: Diagnosis present

## 2019-09-21 DIAGNOSIS — Z833 Family history of diabetes mellitus: Secondary | ICD-10-CM | POA: Diagnosis not present

## 2019-09-21 DIAGNOSIS — L409 Psoriasis, unspecified: Secondary | ICD-10-CM | POA: Diagnosis present

## 2019-09-21 DIAGNOSIS — I10 Essential (primary) hypertension: Secondary | ICD-10-CM | POA: Diagnosis present

## 2019-09-21 DIAGNOSIS — I69351 Hemiplegia and hemiparesis following cerebral infarction affecting right dominant side: Secondary | ICD-10-CM | POA: Diagnosis not present

## 2019-09-21 DIAGNOSIS — I495 Sick sinus syndrome: Secondary | ICD-10-CM | POA: Diagnosis present

## 2019-09-21 DIAGNOSIS — T148XXA Other injury of unspecified body region, initial encounter: Secondary | ICD-10-CM

## 2019-09-21 DIAGNOSIS — D62 Acute posthemorrhagic anemia: Secondary | ICD-10-CM | POA: Diagnosis present

## 2019-09-21 DIAGNOSIS — Z7901 Long term (current) use of anticoagulants: Secondary | ICD-10-CM | POA: Diagnosis not present

## 2019-09-21 DIAGNOSIS — E119 Type 2 diabetes mellitus without complications: Secondary | ICD-10-CM | POA: Diagnosis present

## 2019-09-21 DIAGNOSIS — I700 Atherosclerosis of aorta: Secondary | ICD-10-CM | POA: Diagnosis present

## 2019-09-21 DIAGNOSIS — F329 Major depressive disorder, single episode, unspecified: Secondary | ICD-10-CM | POA: Diagnosis present

## 2019-09-21 DIAGNOSIS — I959 Hypotension, unspecified: Secondary | ICD-10-CM | POA: Diagnosis present

## 2019-09-21 DIAGNOSIS — I480 Paroxysmal atrial fibrillation: Secondary | ICD-10-CM | POA: Diagnosis present

## 2019-09-21 DIAGNOSIS — E1159 Type 2 diabetes mellitus with other circulatory complications: Secondary | ICD-10-CM | POA: Diagnosis not present

## 2019-09-21 DIAGNOSIS — S8011XA Contusion of right lower leg, initial encounter: Secondary | ICD-10-CM | POA: Diagnosis not present

## 2019-09-21 DIAGNOSIS — Z20822 Contact with and (suspected) exposure to covid-19: Secondary | ICD-10-CM | POA: Diagnosis present

## 2019-09-21 DIAGNOSIS — G40909 Epilepsy, unspecified, not intractable, without status epilepticus: Secondary | ICD-10-CM | POA: Diagnosis present

## 2019-09-21 DIAGNOSIS — E039 Hypothyroidism, unspecified: Secondary | ICD-10-CM | POA: Diagnosis present

## 2019-09-21 DIAGNOSIS — K766 Portal hypertension: Secondary | ICD-10-CM | POA: Diagnosis present

## 2019-09-21 DIAGNOSIS — Z950 Presence of cardiac pacemaker: Secondary | ICD-10-CM | POA: Diagnosis not present

## 2019-09-21 DIAGNOSIS — R296 Repeated falls: Secondary | ICD-10-CM | POA: Diagnosis present

## 2019-09-21 LAB — COMPREHENSIVE METABOLIC PANEL
ALT: 15 U/L (ref 0–44)
AST: 38 U/L (ref 15–41)
Albumin: 2.5 g/dL — ABNORMAL LOW (ref 3.5–5.0)
Alkaline Phosphatase: 48 U/L (ref 38–126)
Anion gap: 12 (ref 5–15)
BUN: 10 mg/dL (ref 8–23)
CO2: 19 mmol/L — ABNORMAL LOW (ref 22–32)
Calcium: 8.5 mg/dL — ABNORMAL LOW (ref 8.9–10.3)
Chloride: 110 mmol/L (ref 98–111)
Creatinine, Ser: 0.77 mg/dL (ref 0.44–1.00)
GFR calc Af Amer: >60 mL/min (ref >60)
GFR calc non Af Amer: >60 mL/min (ref >60)
Glucose, Bld: 120 mg/dL — ABNORMAL HIGH (ref 70–99)
Potassium: 4.8 mmol/L (ref 3.5–5.1)
Sodium: 141 mmol/L (ref 135–145)
Total Bilirubin: 1.9 mg/dL — ABNORMAL HIGH (ref 0.3–1.2)
Total Protein: 4.9 g/dL — ABNORMAL LOW (ref 6.5–8.1)

## 2019-09-21 LAB — GLUCOSE, CAPILLARY
Glucose-Capillary: 117 mg/dL — ABNORMAL HIGH (ref 70–99)
Glucose-Capillary: 100 mg/dL — ABNORMAL HIGH (ref 70–99)
Glucose-Capillary: 110 mg/dL — ABNORMAL HIGH (ref 70–99)
Glucose-Capillary: 116 mg/dL — ABNORMAL HIGH (ref 70–99)

## 2019-09-21 LAB — HEMOGLOBIN AND HEMATOCRIT, BLOOD
HCT: 27.2 % — ABNORMAL LOW (ref 36.0–46.0)
Hemoglobin: 8.4 g/dL — ABNORMAL LOW (ref 12.0–15.0)

## 2019-09-21 LAB — CBC
HCT: 28.5 % — ABNORMAL LOW (ref 36.0–46.0)
Hemoglobin: 8.7 g/dL — ABNORMAL LOW (ref 12.0–15.0)
MCH: 33 pg (ref 26.0–34.0)
MCHC: 30.5 g/dL (ref 30.0–36.0)
MCV: 108 fL — ABNORMAL HIGH (ref 80.0–100.0)
Platelets: 147 10*3/uL — ABNORMAL LOW (ref 150–400)
RBC: 2.64 MIL/uL — ABNORMAL LOW (ref 3.87–5.11)
RDW: 18 % — ABNORMAL HIGH (ref 11.5–15.5)
WBC: 7.4 10*3/uL (ref 4.0–10.5)
nRBC: 0 % (ref 0.0–0.2)

## 2019-09-21 LAB — SARS CORONAVIRUS 2 BY RT PCR (HOSPITAL ORDER, PERFORMED IN ~~LOC~~ HOSPITAL LAB): SARS Coronavirus 2: NEGATIVE

## 2019-09-21 LAB — HEMOGLOBIN A1C
Hgb A1c MFr Bld: 4.9 % (ref 4.8–5.6)
Mean Plasma Glucose: 93.93 mg/dL

## 2019-09-21 MED ORDER — FUROSEMIDE 20 MG PO TABS
20.0000 mg | ORAL_TABLET | Freq: Every day | ORAL | Status: DC
  Administered 2019-09-21: 20 mg via ORAL
  Filled 2019-09-21 (×2): qty 1

## 2019-09-21 MED ORDER — DICYCLOMINE HCL 10 MG PO CAPS
10.0000 mg | ORAL_CAPSULE | Freq: Three times a day (TID) | ORAL | Status: DC
  Administered 2019-09-21 – 2019-09-22 (×6): 10 mg via ORAL
  Filled 2019-09-21 (×6): qty 1

## 2019-09-21 MED ORDER — CIPROFLOXACIN HCL 500 MG PO TABS
500.0000 mg | ORAL_TABLET | Freq: Two times a day (BID) | ORAL | Status: AC
  Administered 2019-09-21 (×2): 500 mg via ORAL
  Filled 2019-09-21 (×2): qty 1

## 2019-09-21 MED ORDER — LEVETIRACETAM 500 MG PO TABS
500.0000 mg | ORAL_TABLET | Freq: Two times a day (BID) | ORAL | Status: DC
  Administered 2019-09-21 – 2019-09-22 (×3): 500 mg via ORAL
  Filled 2019-09-21 (×3): qty 1

## 2019-09-21 MED ORDER — ACETAMINOPHEN 650 MG RE SUPP: 650.0000 mg | Freq: Four times a day (QID) | RECTAL | Status: DC | PRN

## 2019-09-21 MED ORDER — ACETAMINOPHEN 500 MG PO TABS: 500.0000 mg | ORAL_TABLET | Freq: Four times a day (QID) | ORAL | Status: DC | PRN

## 2019-09-21 MED ORDER — PANTOPRAZOLE SODIUM 40 MG PO TBEC
40.0000 mg | DELAYED_RELEASE_TABLET | Freq: Every day | ORAL | Status: DC
  Administered 2019-09-21 – 2019-09-22 (×2): 40 mg via ORAL
  Filled 2019-09-21 (×2): qty 1

## 2019-09-21 MED ORDER — ACETAMINOPHEN 500 MG PO TABS
1000.0000 mg | ORAL_TABLET | Freq: Three times a day (TID) | ORAL | Status: DC
  Administered 2019-09-21 – 2019-09-22 (×4): 1000 mg via ORAL
  Filled 2019-09-21 (×4): qty 2

## 2019-09-21 MED ORDER — NYSTATIN 100000 UNIT/GM EX POWD
Freq: Two times a day (BID) | CUTANEOUS | Status: DC
  Filled 2019-09-21: qty 15

## 2019-09-21 MED ORDER — METOPROLOL TARTRATE 5 MG/5ML IV SOLN: 5.0000 mg | Freq: Four times a day (QID) | INTRAVENOUS | Status: DC | PRN

## 2019-09-21 MED ORDER — ONDANSETRON HCL 4 MG PO TABS: 4.0000 mg | ORAL_TABLET | Freq: Four times a day (QID) | ORAL | Status: DC | PRN

## 2019-09-21 MED ORDER — METOPROLOL TARTRATE 50 MG PO TABS
50.0000 mg | ORAL_TABLET | Freq: Two times a day (BID) | ORAL | Status: DC
  Administered 2019-09-21: 50 mg via ORAL
  Filled 2019-09-21: qty 1

## 2019-09-21 MED ORDER — LEVOTHYROXINE SODIUM 88 MCG PO TABS
88.0000 ug | ORAL_TABLET | Freq: Every day | ORAL | Status: DC
  Administered 2019-09-21 – 2019-09-22 (×2): 88 ug via ORAL
  Filled 2019-09-21 (×2): qty 1

## 2019-09-21 MED ORDER — INSULIN ASPART 100 UNIT/ML ~~LOC~~ SOLN: 0.0000 [IU] | Freq: Three times a day (TID) | SUBCUTANEOUS | Status: DC

## 2019-09-21 MED ORDER — POTASSIUM CHLORIDE CRYS ER 20 MEQ PO TBCR
40.0000 meq | EXTENDED_RELEASE_TABLET | Freq: Two times a day (BID) | ORAL | Status: DC
  Administered 2019-09-21: 40 meq via ORAL
  Filled 2019-09-21: qty 2

## 2019-09-21 MED ORDER — FENTANYL CITRATE (PF) 100 MCG/2ML IJ SOLN: 12.5000 ug | INTRAMUSCULAR | Status: DC | PRN

## 2019-09-21 MED ORDER — LOPERAMIDE HCL 2 MG PO CAPS: 4.0000 mg | ORAL_CAPSULE | ORAL | Status: DC | PRN

## 2019-09-21 MED ORDER — CYCLOBENZAPRINE HCL 10 MG PO TABS
5.0000 mg | ORAL_TABLET | Freq: Three times a day (TID) | ORAL | Status: DC | PRN
  Administered 2019-09-21: 5 mg via ORAL
  Filled 2019-09-21: qty 1

## 2019-09-21 MED ORDER — OXYCODONE HCL 5 MG PO TABS: 5.0000 mg | ORAL_TABLET | ORAL | Status: DC | PRN

## 2019-09-21 MED ORDER — ACETAMINOPHEN 325 MG PO TABS: 650.0000 mg | ORAL_TABLET | Freq: Four times a day (QID) | ORAL | Status: DC | PRN

## 2019-09-21 MED ORDER — METFORMIN HCL ER 500 MG PO TB24
500.0000 mg | ORAL_TABLET | Freq: Two times a day (BID) | ORAL | Status: DC
  Administered 2019-09-21: 500 mg via ORAL
  Filled 2019-09-21: qty 1

## 2019-09-21 MED ORDER — MIRTAZAPINE 15 MG PO TABS
15.0000 mg | ORAL_TABLET | Freq: Every day | ORAL | Status: DC
  Administered 2019-09-21: 15 mg via ORAL
  Filled 2019-09-21: qty 1

## 2019-09-21 MED ORDER — METOPROLOL TARTRATE 25 MG PO TABS
25.0000 mg | ORAL_TABLET | Freq: Two times a day (BID) | ORAL | Status: DC
  Administered 2019-09-21 – 2019-09-22 (×2): 25 mg via ORAL
  Filled 2019-09-21 (×2): qty 1

## 2019-09-21 MED ORDER — CITALOPRAM HYDROBROMIDE 20 MG PO TABS
20.0000 mg | ORAL_TABLET | Freq: Two times a day (BID) | ORAL | Status: DC
  Administered 2019-09-21 – 2019-09-22 (×3): 20 mg via ORAL
  Filled 2019-09-21 (×3): qty 1

## 2019-09-21 MED ORDER — ONDANSETRON HCL 4 MG/2ML IJ SOLN: 4.0000 mg | Freq: Four times a day (QID) | INTRAMUSCULAR | Status: DC | PRN

## 2019-09-21 NOTE — Plan of Care (Signed)
  Problem: Health Behavior/Discharge Planning: Goal: Ability to manage health-related needs will improve Outcome: Progressing   Problem: Safety: Goal: Ability to remain free from injury will improve Outcome: Progressing   Problem: Skin Integrity: Goal: Risk for impaired skin integrity will decrease Outcome: Not Progressing

## 2019-09-21 NOTE — Progress Notes (Signed)
Orthopedic Tech Progress Note Patient Details:  [name] 17-Nov-1945 [id number] Confirmed with nurse that ace wrap will not be able to be applied to hip. Patient ID: [name], female   DOB: 10-03-1945, 74 y.o.   MRN: [id number]   Tammy Sours 09/21/2019, 12:44 PM

## 2019-09-21 NOTE — Consult Note (Signed)
Reason for Consult: right hip pain/contusion Referring Physician: Eliseo Squires, MD  [name] is an 74 y.o. female.  HPI: [name] is a 74 y.o. female with medical history significant of hypertension, type 2 diabetes, history of left CVA with resultant right-sided weakness, paroxysmal A. fib on Eliquis, hypothyroidism, psoriasis on methotrexate who presents having fallen today at home at approximately 1430.  The patient has residual left arm weakness and left leg weakness and uses a gait belt when trying to get around.  Her husband was gone to the store she got up by herself and fell to the right.  Her husband had her in the shower and noted a very large hematoma on her hip and so brought her initially to an urgent care and then was recommended to come to the ED.  The patient's from [facility] and they decided to come to [place] for care.  She has history of CVA with resultant effect on speech and right upper and lower extremity weakness  Complains of right hip pain  Past Medical History:  Diagnosis Date  . Anemia   . Bronchial spasms   . Chronic anxiety   . Chronic kidney disease   . Complication of anesthesia    " I shake real bad "  . Diverticulosis   . Elevated blood sugar   . Esophageal stricture   . Family history of anesthesia complication    Daughter also shakes while waking up  . GERD (gastroesophageal reflux disease)   . H/O hiatal hernia   . H/O scarlet fever   . History of bladder repair surgery   . Hx of vaginal hysterectomy   . Hypertension   . Hypothyroidism   . IBS (irritable bowel syndrome)    vs diarrhea vs abd. fullness   . Impaired glucose tolerance   . Neuromuscular disorder ([place])    periferal neuropathy  . Pacemaker    St. Jude  . Paroxysmal atrial fibrillation (HCC)    a. failed flecainide, tikosyn, amio;  b. 01/2012 s/p RFCA.  . Psoriasis   . Psoriasis   . Rectocele, female   . Seizures ([place])   . Stroke ([place])   . Tachy-brady syndrome  ([place])    a. 08/19/2008 s/p PPM: SJM 2110 Accent  . Uterine prolapse   . Vaginal prolapse 1998    Past Surgical History:  Procedure Laterality Date  . ATRIAL FIBRILLATION ABLATION  02/06/12   PVI by Dr Rayann Heman  . ATRIAL FIBRILLATION ABLATION  07/04/2012   repeat PVI by Dr Rayann Heman  . ATRIAL FIBRILLATION ABLATION N/A 02/06/2012   Procedure: ATRIAL FIBRILLATION ABLATION;  Surgeon: Thompson Grayer, MD;  Location: [facility] CATH LAB;  Service: Cardiovascular;  Laterality: N/A;  . ATRIAL FIBRILLATION ABLATION N/A 07/04/2012   Procedure: ATRIAL FIBRILLATION ABLATION;  Surgeon: Thompson Grayer, MD;  Location: [facility] CATH LAB;  Service: Cardiovascular;  Laterality: N/A;  . BLADDER SUSPENSION    . INSERT / REPLACE / REMOVE PACEMAKER     SJM  . LEFT HEART CATHETERIZATION WITH CORONARY ANGIOGRAM N/A 03/24/2013   Procedure: LEFT HEART CATHETERIZATION WITH CORONARY ANGIOGRAM;  Surgeon: Blane Ohara, MD;  Location: [facility] CATH LAB;  Service: Cardiovascular;  Laterality: N/A;  . RECTOCELE REPAIR    . TEE WITHOUT CARDIOVERSION  02/06/2012   Procedure: TRANSESOPHAGEAL ECHOCARDIOGRAM (TEE);  Surgeon: Thayer Headings, MD;  Location: [place];  Service: Cardiovascular;  Laterality: N/A;  . TEE WITHOUT CARDIOVERSION N/A 07/04/2012   Procedure: TRANSESOPHAGEAL ECHOCARDIOGRAM (TEE);  Surgeon:  Lelon Perla, MD;  Location: [facility] ENDOSCOPY;  Service: Cardiovascular;  Laterality: N/A;  . TRANSTHORACIC ECHOCARDIOGRAM  2008  . VAGINAL HYSTERECTOMY     prolapse     Family History  Problem Relation Age of Onset  . Diabetes Mother   . Heart disease Mother   . Heart failure Mother   . Macular degeneration Mother   . Heart disease Father   . Breast cancer Sister   . Ovarian cancer Sister   . Uterine cancer Paternal Aunt   . Crohn's disease Other        neice  . Diabetes Maternal Grandmother   . Diabetes Sister   . Heart disease Brother   . Heart disease Brother   . Macular degeneration Brother   . Colon cancer Neg Hx   .  Stomach cancer Neg Hx     Social History:  reports that she has never smoked. She has never used smokeless tobacco. She reports that she does not drink alcohol and does not use drugs.  Allergies:  Allergies  Allergen Reactions  . Latex Rash  . Caudal Tray [Lidocaine-Epinephrine]     Edema    . Codeine Nausea Only  . Diovan [Valsartan] Itching  . Doxycycline Other (See Comments)    Might have caused heart to race per pt - not sure  . Esomeprazole Magnesium     Headache    . Levofloxacin     Insomnia    . Myrbetriq [Mirabegron] Diarrhea  . Neomycin-Bacitracin Zn-Polymyx     rash  . Penicillins     hives  . Sulfa Drugs Cross Reactors Other (See Comments)    Pt not sure what reaction was  . Verapamil     Edema   . Vimovo [Naproxen-Esomeprazole]     Upset stomach     Medications:  I have reviewed the patient's current medications. Scheduled: . acetaminophen  1,000 mg Oral TID  . ciprofloxacin  500 mg Oral BID  . citalopram  20 mg Oral BID  . dicyclomine  10 mg Oral TID AC & HS  . furosemide  20 mg Oral Daily  . insulin aspart  0-9 Units Subcutaneous TID WC  . levETIRAcetam  500 mg Oral BID  . levothyroxine  88 mcg Oral QAC breakfast  . metoprolol tartrate  25 mg Oral BID  . mirtazapine  15 mg Oral QHS  . nystatin   Topical BID  . pantoprazole  40 mg Oral Daily    Results for orders placed or performed during the hospital encounter of 09/20/19 (from the past 24 hour(s))  CBC with Differential     Status: Abnormal   Collection Time: 09/20/19  9:29 PM  Result Value Ref Range   WBC 9.9 4.0 - 10.5 K/uL   RBC 3.30 (L) 3.87 - 5.11 MIL/uL   Hemoglobin 10.7 (L) 12.0 - 15.0 g/dL   HCT 36.2 36 - 46 %   MCV 109.7 (H) 80.0 - 100.0 fL   MCH 32.4 26.0 - 34.0 pg   MCHC 29.6 (L) 30.0 - 36.0 g/dL   RDW 18.2 (H) 11.5 - 15.5 %   Platelets 219 150 - 400 K/uL   nRBC 0.0 0.0 - 0.2 %   Neutrophils Relative % 56 %   Neutro Abs 5.4 1.7 - 7.7 K/uL   Lymphocytes Relative 30 %    Lymphs Abs 3.0 0.7 - 4.0 K/uL   Monocytes Relative 9 %   Monocytes Absolute 0.9 0 - 1 K/uL  Eosinophils Relative 3 %   Eosinophils Absolute 0.3 0 - 0 K/uL   Basophils Relative 1 %   Basophils Absolute 0.1 0 - 0 K/uL   Immature Granulocytes 1 %   Abs Immature Granulocytes 0.06 0.00 - 0.07 K/uL  Comprehensive metabolic panel     Status: Abnormal   Collection Time: 09/20/19  9:29 PM  Result Value Ref Range   Sodium 145 135 - 145 mmol/L   Potassium 4.3 3.5 - 5.1 mmol/L   Chloride 110 98 - 111 mmol/L   CO2 20 (L) 22 - 32 mmol/L   Glucose, Bld 118 (H) 70 - 99 mg/dL   BUN 8 8 - 23 mg/dL   Creatinine, Ser 0.80 0.44 - 1.00 mg/dL   Calcium 9.1 8.9 - 10.3 mg/dL   Total Protein 5.9 (L) 6.5 - 8.1 g/dL   Albumin 3.0 (L) 3.5 - 5.0 g/dL   AST 51 (H) 15 - 41 U/L   ALT 18 0 - 44 U/L   Alkaline Phosphatase 67 38 - 126 U/L   Total Bilirubin 1.9 (H) 0.3 - 1.2 mg/dL   GFR calc non Af Amer >60 >60 mL/min   GFR calc Af Amer >60 >60 mL/min   Anion gap 15 5 - 15  Lipase, blood     Status: None   Collection Time: 09/20/19  9:29 PM  Result Value Ref Range   Lipase 34 11 - 51 U/L  Troponin I (High Sensitivity)     Status: None   Collection Time: 09/20/19  9:29 PM  Result Value Ref Range   Troponin I (High Sensitivity) 5 <18 ng/L  I-stat chem 8, ED (not at [facility] or [facility])     Status: Abnormal   Collection Time: 09/20/19  9:39 PM  Result Value Ref Range   Sodium 146 (H) 135 - 145 mmol/L   Potassium 4.2 3.5 - 5.1 mmol/L   Chloride 108 98 - 111 mmol/L   BUN 8 8 - 23 mg/dL   Creatinine, Ser 0.40 (L) 0.44 - 1.00 mg/dL   Glucose, Bld 108 (H) 70 - 99 mg/dL   Calcium, Ion 1.18 1.15 - 1.40 mmol/L   TCO2 18 (L) 22 - 32 mmol/L   Hemoglobin 12.6 12.0 - 15.0 g/dL   HCT 37.0 36 - 46 %  SARS Coronavirus 2 by RT PCR (hospital order, performed in [place] hospital lab) Nasopharyngeal Nasopharyngeal Swab     Status: None   Collection Time: 09/21/19  2:03 AM   Specimen: Nasopharyngeal Swab  Result Value Ref  Range   SARS Coronavirus 2 NEGATIVE NEGATIVE  Comprehensive metabolic panel     Status: Abnormal   Collection Time: 09/21/19  5:38 AM  Result Value Ref Range   Sodium 141 135 - 145 mmol/L   Potassium 4.8 3.5 - 5.1 mmol/L   Chloride 110 98 - 111 mmol/L   CO2 19 (L) 22 - 32 mmol/L   Glucose, Bld 120 (H) 70 - 99 mg/dL   BUN 10 8 - 23 mg/dL   Creatinine, Ser 0.77 0.44 - 1.00 mg/dL   Calcium 8.5 (L) 8.9 - 10.3 mg/dL   Total Protein 4.9 (L) 6.5 - 8.1 g/dL   Albumin 2.5 (L) 3.5 - 5.0 g/dL   AST 38 15 - 41 U/L   ALT 15 0 - 44 U/L   Alkaline Phosphatase 48 38 - 126 U/L   Total Bilirubin 1.9 (H) 0.3 - 1.2 mg/dL   GFR calc non Af Amer >60 >  60 mL/min   GFR calc Af Amer >60 >60 mL/min   Anion gap 12 5 - 15  CBC     Status: Abnormal   Collection Time: 09/21/19  5:38 AM  Result Value Ref Range   WBC 7.4 4.0 - 10.5 K/uL   RBC 2.64 (L) 3.87 - 5.11 MIL/uL   Hemoglobin 8.7 (L) 12.0 - 15.0 g/dL   HCT 28.5 (L) 36 - 46 %   MCV 108.0 (H) 80.0 - 100.0 fL   MCH 33.0 26.0 - 34.0 pg   MCHC 30.5 30.0 - 36.0 g/dL   RDW 18.0 (H) 11.5 - 15.5 %   Platelets 147 (L) 150 - 400 K/uL   nRBC 0.0 0.0 - 0.2 %  Hemoglobin A1c     Status: None   Collection Time: 09/21/19  5:38 AM  Result Value Ref Range   Hgb A1c MFr Bld 4.9 4.8 - 5.6 %   Mean Plasma Glucose 93.93 mg/dL  Glucose, capillary     Status: Abnormal   Collection Time: 09/21/19  6:39 AM  Result Value Ref Range   Glucose-Capillary 117 (H) 70 - 99 mg/dL    X-ray: CLINICAL DATA:  Fall, on anticoagulation, right upper abdominal and hip pain  EXAM: CT ABDOMEN AND PELVIS WITH CONTRAST  TECHNIQUE: Multidetector CT imaging of the abdomen and pelvis was performed using the standard protocol following bolus administration of intravenous contrast.  CONTRAST:  154mL OMNIPAQUE IOHEXOL 300 MG/ML  SOLN  COMPARISON:  CT 06/07/2012  FINDINGS: Lower chest: Atelectatic changes in the lung bases with some abundant subpleural fat in the posterior  lung bases. Cardiac size is borderline enlarged with leads at the right atrium and cardiac apex. Left atrial appendage occlusion device is noted as well. No pericardial effusion or thickening.  Hepatobiliary: Some mild heterogeneity of the liver, possibly attributable to contrast timing though could correlate with serologies. Mild nodular liver surface contour noted as well. No concerning focal liver abnormality. No direct hepatic injury or perihepatic hematoma. Prominent fold at the gallbladder neck. Gallbladder otherwise unremarkable without visible calcified gallstone. Mild prominence of the biliary tree is within normal limits with the common bile duct measuring up to 7 mm which is age-appropriate. No visible calcified intraductal gallstones.  Pancreas: Mild pancreatic atrophy. No focal peripancreatic inflammation or ductal dilatation  Spleen: No direct splenic injury or perisplenic hematoma. Normal splenic size. No concerning splenic lesion  Adrenals/Urinary Tract: No adrenal hemorrhage or suspicious adrenal lesions. No direct renal injury or perinephric hematoma. Kidneys enhance and excrete symmetrically. No concerning renal mass. No urolithiasis or hydronephrosis. Urinary bladder is largely decompressed at the time of exam and therefore poorly evaluated by CT imaging. Evidence of prior urethral sling.  Stomach/Bowel: Prominent paraesophageal venous collaterals and varices are noted. Stomach is unremarkable. Paired air-filled duodenal diverticula are present, more proximal measuring 1.9 cm in size, more distal measuring up to 4 cm in size (coronal 7/53). Hyperattenuating focus within the large diverticulum is nonspecific, possibly ingested material. Distal duodenum is unremarkable with a normal course across the midline abdomen. No small bowel thickening or dilatation. No colonic dilatation or wall thickening. Scattered colonic diverticula without focal inflammation to  suggest diverticulitis.  Vascular/Lymphatic: Atherosclerotic calcifications within the abdominal aorta and branch vessels. No aneurysm or ectasia. No enlarged abdominopelvic lymph nodes.Focal region of mid mesenteric hazy stranding with numerous reactive appearing clustered mid mesenteric lymph nodes compatible with mesenteritis (3/55), new from prior.  Reproductive: Uterus is surgically absent. No concerning adnexal lesions.  Other: Large fat containing paraumbilical hernia with a 5.5 x 3.8 cm fascial defect (transverse by craniocaudal). No evidence of strangulation of the fatty internal contents. No bowel containing hernias.  Musculoskeletal: Extensive right lateral hip swelling and contusive change with heterogeneous, intermediate attenuation hematoma over the lateral hip, incompletely included within the level of imaging given partial collimation of the lateral soft tissues. Best seen on sagittal reconstructions measuring up to 11 cm in length, and up to 4.9 cm AP by greater than 3.5 cm transverse. No acute osseous injuries are evident. Multilevel degenerative changes are present in the imaged portions of the spine. Additional degenerative changes in the hips and pelvis surgical anchors noted at the pubic bodies. Benign bone island in the right femoral neck.  IMPRESSION: 1. Extensive right lateral hip swelling and contusive change with heterogeneous, intermediate attenuation collection over the lateral hip, incompletely included within the level of imaging given partial collimation of the lateral soft tissues. Best seen on sagittal reconstructions measuring up to 11 cm in length, and up to 4.9 cm AP by greater than 3.5 cm transverse. Could reflect a simple hematoma or Sherry Ruffing type injury given size and location. No acute osseous injuries are evident. 2. No acute osseous injury of the included lumbar spine or bony pelvis. 3. Focal region of mid mesenteric hazy  stranding with numerous reactive appearing clustered mid mesenteric lymph nodes compatible with sequela of mesenteritis, new from prior. 4. Hepatic heterogeneity and nodular liver surface contour, could reflect intrinsic liver disease/cirrhosis. Correlate with serology. 5. Prominent paraesophageal venous collaterals and varices are noted. Suggestive of portal hypertension. 6. Large fat containing paraumbilical hernia with a 5.5 x 3.8 cm fascial defect (transverse by craniocaudal). No evidence of strangulation of the fatty internal contents. 7. Colonic diverticulosis without evidence of diverticulitis. 8. Evidence of prior urethral sling. 9. Aortic Atherosclerosis (ICD10-I70.0).   Electronically Signed   By: Lovena Le M.D.  ROS  As noted in admitting H&P  Blood pressure (!) 102/45, pulse 72, temperature 98.5 F (36.9 C), temperature source Oral, resp. rate 18, SpO2 100 %.  Physical Exam: Constitutional: NAD, calm, comfortable, she nods and smiles and interacts but her husband did most of the talking for her Eyes: PERRL, lids and conjunctivae normal ENMT: Mucous membranes are moist. Posterior pharynx clear of any exudate or lesions.Normal dentition.  Neck: normal, supple, no masses, no thyromegaly Respiratory: clear to auscultation bilaterally, no wheezing, no crackles. Normal respiratory effort. No accessory muscle use.  Cardiovascular: Regular rate and rhythm, no murmurs / rubs / gallops. No extremity edema. 2+ pedal pulses. No carotid bruits.  Abdomen: no tenderness, no masses palpated. No hepatosplenomegaly. Bowel sounds positive.  Musculoskeletal: no clubbing / cyanosis. No joint deformity upper and lower extremities. Good ROM, right hand contracture Normal muscle tone.   RIGHT hip exam: Focused swelling and bruising of right hip with firm hematoma to palpation no fluid wave Some pain with movement Otherwise weakness of right upper with contractures and lower  extremity Skin: no rashes, ulcers.  Numerous ecchymoses, large firm hematoma noted on the right hip. Media Information   Media Information    Neurologic: Right hemiparesis, right hand is clenched Psychiatric: Normal judgment and insight. Alert and oriented x 3. Normal mood.    Assessment: Status post fall with no fractures but with right hip hematoma  Plan: Non surgical management of right hip hematoma Ice to right hip for pain and to assist with reducing swelling Activity as tolerable PT consult will  aid in determining functional status and discharge needs Can follow up with local primary care doc as needed  Mauri Pole 09/21/2019, 10:56 AM

## 2019-09-21 NOTE — Progress Notes (Addendum)
Progress Note    [name]  DQQ:[id number] DOB: 11-12-1945  DOA: 09/20/2019 PCP: Dettinger, Fransisca Kaufmann, MD    Brief Narrative:     Medical records reviewed and are as summarized below:  [name] is an 74 y.o. female with medical history significant of hypertension, type 2 diabetes, history of left CVA with resultant right-sided weakness, paroxysmal A. fib on Eliquis, hypothyroidism, psoriasis on methotrexate who presents having fallen today at home at approximately 1430.  The patient has residual left arm weakness and left leg weakness and uses a gait belt when trying to get around.  Her husband was gone to the store she got up by herself and fell to the right.  Her husband had her in the shower and noted a very large hematoma on her hip and so brought her initially to an urgent care and then was recommended to come to the ED.  The patient's from [facility] and they decided to come to [place] for care.  Assessment/Plan:   Principal Problem:   Sherry Ruffing lesion Active Problems:   Hypothyroidism   HTN (hypertension)   PAF (paroxysmal atrial fibrillation) (HCC)   Tachy-brady syndrome (HCC)   Late effects of cerebral ischemic stroke   Type 2 diabetes mellitus (HCC)   Aortic atherosclerosis (HCC)   Paralysis of right lower extremity ([place])   Fall as cause of accidental injury at home as place of occurrence   Portal hypertension ([place])   Anemia   Status post fall Has home PT already Stroke was approximately 7 years ago so has been coping with this for some time   hematoma/lesion of the right hip: extensive right contusion about the hip which by CT measured 11 x 5 x 3.5 Very large probably related to ongoing Eliquis use No evidence of fracture Nurse this AM placed outline so will monitor closely but based on pictures in H&P this area has darkened and expanded -ER spoke with ortho who recommended observation -drop of Hgb 13.3--->8.7 -H/H q 12  hours  History of left CVA with right hemiparesis -PT eval  Hypertension On Lopressor 50 twice daily- reduce to 25 mg due to hypotension and provide holding parameters -if continues to be hypotensive, will need PRBC  Hypothyroid Continue Synthroid  UTI This is an ongoing issue for her primary care physician has called in this medication for her and she started on 728 Continue ciprofloxacin  Depression On Celexa  Psoriasis On methotrexate, on hold for admission  Paroxysmal A. fib on Eliquis Holding Eliquis for now due to large hematoma  Type 2 diabetes On Trulicity at home weekly Sliding scale insulin -hgbA1c: 4.9  Portal hypertension Her AST is mildly elevated as well at 51 No history of this prior was seen on CT scan Can be followed up as an outpatient  Acute blood loss anemia -due to hip hematoma -trend with H/H this PM -transfuse for <7 or symptoms  Seizure disorder On Keppra    Family Communication/Anticipated D/C date and plan/Code Status   DVT prophylaxis: scd Code Status: Full Code.  Family Communication: called husband, no answer Disposition Plan: Status is: Observation  The patient will require care spanning > 2 midnights and should be moved to inpatient because: Hemodynamically unstable  Dispo: The patient is from: Home              Anticipated d/c is to: Home              Anticipated d/c  date is: 2 days              Patient currently is not medically stable to d/c.         Medical Consultants:    Ortho Delfino Lovett- not clear from note if they plan to see today but they are on the treatment team    Subjective:   Patient says she thinks her pain is better (speech difficult to understand at times)  Objective:    Vitals:   09/21/19 0315 09/21/19 0337 09/21/19 0428 09/21/19 1019  BP: (!) 100/46 (!) 103/45 (!) 120/52 (!) 102/45  Pulse: 76 76 80 72  Resp:  14 16 18   Temp:  98 F (36.7 C) 98.3 F (36.8 C) 98.5 F (36.9  C)  TempSrc:  Oral Oral Oral  SpO2: 96% 97% 98% 100%   No intake or output data in the 24 hours ending 09/21/19 1027 There were no vitals filed for this visit.  Exam:  General: Appearance:     Overweight female in no acute distress     Lungs:     Clear to auscultation bilaterally, respirations unlabored  Heart:    Normal heart rate.   MS:   All extremities are intact. Large hematoma on right hip, has not expanded past the skin markings placed this AM  Neurologic: Awake and alert, nods, smiles but says few words, right hemiparesis    Data Reviewed:   I have personally reviewed following labs and imaging studies:  Labs: Labs show the following:   Basic Metabolic Panel: Recent Labs  Lab 09/20/19 2129 09/20/19 2129 09/20/19 2139 09/21/19 0538  NA 145  --  146* 141  K 4.3   < > 4.2 4.8  CL 110  --  108 110  CO2 20*  --   --  19*  GLUCOSE 118*  --  108* 120*  BUN 8  --  8 10  CREATININE 0.80  --  0.40* 0.77  CALCIUM 9.1  --   --  8.5*   < > = values in this interval not displayed.   GFR CrCl cannot be calculated (Unknown ideal weight.). Liver Function Tests: Recent Labs  Lab 09/20/19 2129 09/21/19 0538  AST 51* 38  ALT 18 15  ALKPHOS 67 48  BILITOT 1.9* 1.9*  PROT 5.9* 4.9*  ALBUMIN 3.0* 2.5*   Recent Labs  Lab 09/20/19 2129  LIPASE 34   No results for input(s): AMMONIA in the last 168 hours. Coagulation profile No results for input(s): INR, PROTIME in the last 168 hours.  CBC: Recent Labs  Lab 09/20/19 2129 09/20/19 2139 09/21/19 0538  WBC 9.9  --  7.4  NEUTROABS 5.4  --   --   HGB 10.7* 12.6 8.7*  HCT 36.2 37.0 28.5*  MCV 109.7*  --  108.0*  PLT 219  --  147*   Cardiac Enzymes: No results for input(s): CKTOTAL, CKMB, CKMBINDEX, TROPONINI in the last 168 hours. BNP (last 3 results) No results for input(s): PROBNP in the last 8760 hours. CBG: Recent Labs  Lab 09/21/19 0639  GLUCAP 117*   D-Dimer: No results for input(s): DDIMER in  the last 72 hours. Hgb A1c: Recent Labs    09/21/19 0538  HGBA1C 4.9   Lipid Profile: No results for input(s): CHOL, HDL, LDLCALC, TRIG, CHOLHDL, LDLDIRECT in the last 72 hours. Thyroid function studies: No results for input(s): TSH, T4TOTAL, T3FREE, THYROIDAB in the last 72 hours.  Invalid input(s): FREET3 Anemia  work up: No results for input(s): VITAMINB12, FOLATE, FERRITIN, TIBC, IRON, RETICCTPCT in the last 72 hours. Sepsis Labs: Recent Labs  Lab 09/20/19 2129 09/21/19 0538  WBC 9.9 7.4    Microbiology Recent Results (from the past 240 hour(s))  SARS Coronavirus 2 by RT PCR (hospital order, performed in [facility] hospital lab) Nasopharyngeal Nasopharyngeal Swab     Status: None   Collection Time: 09/21/19  2:03 AM   Specimen: Nasopharyngeal Swab  Result Value Ref Range Status   SARS Coronavirus 2 NEGATIVE NEGATIVE Final    Comment: (NOTE) SARS-CoV-2 target nucleic acids are NOT DETECTED.  The SARS-CoV-2 RNA is generally detectable in upper and lower respiratory specimens during the acute phase of infection. The lowest concentration of SARS-CoV-2 viral copies this assay can detect is 250 copies / mL. A negative result does not preclude SARS-CoV-2 infection and should not be used as the sole basis for treatment or other patient management decisions.  A negative result may occur with improper specimen collection / handling, submission of specimen other than nasopharyngeal swab, presence of viral mutation(s) within the areas targeted by this assay, and inadequate number of viral copies (<250 copies / mL). A negative result must be combined with clinical observations, patient history, and epidemiological information.  Fact Sheet for Patients:   StrictlyIdeas.no  Fact Sheet for Healthcare Providers: BankingDealers.co.za  This test is not yet approved or  cleared by the [place] FDA and has been authorized for  detection and/or diagnosis of SARS-CoV-2 by FDA under an Emergency Use Authorization (EUA).  This EUA will remain in effect (meaning this test can be used) for the duration of the COVID-19 declaration under Section 564(b)(1) of the Act, 21 U.S.C. section 360bbb-3(b)(1), unless the authorization is terminated or revoked sooner.  Performed at [facility], [place] [address], [place], [place] [address]     Procedures and diagnostic studies:  DG Chest 2 View  Result Date: 09/20/2019 CLINICAL DATA:  Fatigue and right shoulder pain EXAM: CHEST - 2 VIEW COMPARISON:  August 19, 2018 FINDINGS: There is mild cardiomegaly. Aortic knob calcifications. A left-sided pacemaker is seen. Both lungs are clear. Tiny ossific densities are seen superior to the acromion which could be tiny chip fractures. IMPRESSION: No active cardiopulmonary disease. Possible tiny chip fractures of the superior acromion Electronically Signed   By: Prudencio Pair M.D.   On: 09/20/2019 22:58   DG Shoulder Right  Result Date: 09/20/2019 CLINICAL DATA:  Right shoulder pain and fall EXAM: RIGHT SHOULDER - 2+ VIEW COMPARISON:  None. FINDINGS: Tiny ossific densities are seen at the distal acromion, likely chip fractures. Overlying soft tissue swelling is seen. There is diffuse osteopenia. IMPRESSION: Tiny ossific density seen at the superior acromion which could represent tiny chip fractures Electronically Signed   By: Prudencio Pair M.D.   On: 09/20/2019 23:03   CT Head Wo Contrast  Result Date: 09/20/2019 CLINICAL DATA:  Recent fall while on blood thinners, initial encounter EXAM: CT HEAD WITHOUT CONTRAST TECHNIQUE: Contiguous axial images were obtained from the base of the skull through the vertex without intravenous contrast. COMPARISON:  None. FINDINGS: Brain: Changes consistent with prior left MCA infarct are noted. No findings to suggest acute hemorrhage, acute infarction or space-occupying mass lesion are seen. Vascular: No  hyperdense vessel or unexpected calcification. Skull: Normal. Negative for fracture or focal lesion. Sinuses/Orbits: No acute finding. Other: None. IMPRESSION: Changes of prior left MCA infarct. No acute abnormality noted. Electronically Signed  By: Inez Catalina M.D.   On: 09/20/2019 21:55   CT ABDOMEN PELVIS W CONTRAST  Result Date: 09/20/2019 CLINICAL DATA:  Fall, on anticoagulation, right upper abdominal and hip pain EXAM: CT ABDOMEN AND PELVIS WITH CONTRAST TECHNIQUE: Multidetector CT imaging of the abdomen and pelvis was performed using the standard protocol following bolus administration of intravenous contrast. CONTRAST:  121mL OMNIPAQUE IOHEXOL 300 MG/ML  SOLN COMPARISON:  CT 06/07/2012 FINDINGS: Lower chest: Atelectatic changes in the lung bases with some abundant subpleural fat in the posterior lung bases. Cardiac size is borderline enlarged with leads at the right atrium and cardiac apex. Left atrial appendage occlusion device is noted as well. No pericardial effusion or thickening. Hepatobiliary: Some mild heterogeneity of the liver, possibly attributable to contrast timing though could correlate with serologies. Mild nodular liver surface contour noted as well. No concerning focal liver abnormality. No direct hepatic injury or perihepatic hematoma. Prominent fold at the gallbladder neck. Gallbladder otherwise unremarkable without visible calcified gallstone. Mild prominence of the biliary tree is within normal limits with the common bile duct measuring up to 7 mm which is age-appropriate. No visible calcified intraductal gallstones. Pancreas: Mild pancreatic atrophy. No focal peripancreatic inflammation or ductal dilatation Spleen: No direct splenic injury or perisplenic hematoma. Normal splenic size. No concerning splenic lesion Adrenals/Urinary Tract: No adrenal hemorrhage or suspicious adrenal lesions. No direct renal injury or perinephric hematoma. Kidneys enhance and excrete symmetrically. No  concerning renal mass. No urolithiasis or hydronephrosis. Urinary bladder is largely decompressed at the time of exam and therefore poorly evaluated by CT imaging. Evidence of prior urethral sling. Stomach/Bowel: Prominent paraesophageal venous collaterals and varices are noted. Stomach is unremarkable. Paired air-filled duodenal diverticula are present, more proximal measuring 1.9 cm in size, more distal measuring up to 4 cm in size (coronal 7/53). Hyperattenuating focus within the large diverticulum is nonspecific, possibly ingested material. Distal duodenum is unremarkable with a normal course across the midline abdomen. No small bowel thickening or dilatation. No colonic dilatation or wall thickening. Scattered colonic diverticula without focal inflammation to suggest diverticulitis. Vascular/Lymphatic: Atherosclerotic calcifications within the abdominal aorta and branch vessels. No aneurysm or ectasia. No enlarged abdominopelvic lymph nodes.Focal region of mid mesenteric hazy stranding with numerous reactive appearing clustered mid mesenteric lymph nodes compatible with mesenteritis (3/55), new from prior. Reproductive: Uterus is surgically absent. No concerning adnexal lesions. Other: Large fat containing paraumbilical hernia with a 5.5 x 3.8 cm fascial defect (transverse by craniocaudal). No evidence of strangulation of the fatty internal contents. No bowel containing hernias. Musculoskeletal: Extensive right lateral hip swelling and contusive change with heterogeneous, intermediate attenuation hematoma over the lateral hip, incompletely included within the level of imaging given partial collimation of the lateral soft tissues. Best seen on sagittal reconstructions measuring up to 11 cm in length, and up to 4.9 cm AP by greater than 3.5 cm transverse. No acute osseous injuries are evident. Multilevel degenerative changes are present in the imaged portions of the spine. Additional degenerative changes in the  hips and pelvis surgical anchors noted at the pubic bodies. Benign bone island in the right femoral neck. IMPRESSION: 1. Extensive right lateral hip swelling and contusive change with heterogeneous, intermediate attenuation collection over the lateral hip, incompletely included within the level of imaging given partial collimation of the lateral soft tissues. Best seen on sagittal reconstructions measuring up to 11 cm in length, and up to 4.9 cm AP by greater than 3.5 cm transverse. Could reflect a simple hematoma or  Sherry Ruffing type injury given size and location. No acute osseous injuries are evident. 2. No acute osseous injury of the included lumbar spine or bony pelvis. 3. Focal region of mid mesenteric hazy stranding with numerous reactive appearing clustered mid mesenteric lymph nodes compatible with sequela of mesenteritis, new from prior. 4. Hepatic heterogeneity and nodular liver surface contour, could reflect intrinsic liver disease/cirrhosis. Correlate with serology. 5. Prominent paraesophageal venous collaterals and varices are noted. Suggestive of portal hypertension. 6. Large fat containing paraumbilical hernia with a 5.5 x 3.8 cm fascial defect (transverse by craniocaudal). No evidence of strangulation of the fatty internal contents. 7. Colonic diverticulosis without evidence of diverticulitis. 8. Evidence of prior urethral sling. 9. Aortic Atherosclerosis (ICD10-I70.0). Electronically Signed   By: Lovena Le M.D.   On: 09/20/2019 22:27    Medications:   . acetaminophen  1,000 mg Oral TID  . ciprofloxacin  500 mg Oral BID  . citalopram  20 mg Oral BID  . dicyclomine  10 mg Oral TID AC & HS  . furosemide  20 mg Oral Daily  . insulin aspart  0-9 Units Subcutaneous TID WC  . levETIRAcetam  500 mg Oral BID  . levothyroxine  88 mcg Oral QAC breakfast  . metFORMIN  500 mg Oral BID WC  . metoprolol tartrate  25 mg Oral BID  . mirtazapine  15 mg Oral QHS  . nystatin   Topical BID  .  pantoprazole  40 mg Oral Daily   Continuous Infusions:   LOS: 0 days   Geradine Girt  [facility]   How to contact the [facility] Attending or Consulting provider [place] or covering provider during after hours [place], for this patient?  1. Check the care team in [facility] and look for a) attending/consulting TR[facility] provider listed and b) the [facility] team listed 2. Log into [email or web address] and use [place]'s universal password to access. If you do not have the password, please contact the hospital operator. 3. Locate the [facility] provider you are looking for under [facility] and page to a number that you can be directly reached. 4. If you still have difficulty reaching the provider, please page the [facility] (Director on Call) for the Hospitalists listed on amion for assistance.  09/21/2019, 10:27 AM

## 2019-09-21 NOTE — Evaluation (Signed)
Physical Therapy Evaluation Patient Details Name: [name] MRN: [id number] DOB: [birth year] Today's Date: 09/21/2019   History of Present Illness  Pt is a 74 y.o. F with significant PMH of HTN, type 2 diabetes, seizure disorder, CVA with right residual weakness, A. fib, psoriasis, who presents after a fall with right large hip hematoma. Plan for nonoperative treatment.  Clinical Impression  Prior to admission, pt lives with her spouse, uses a quad cane for household ambulation, and requires assist with ADL's. Pt reporting improvement in right hip pain (does have baseline communication impairment and noted some inconsistencies with yes/no questions). Requiring min assist for functional mobility. Ambulating 10 feet with left handheld assist and chair follow. BP 114/45 sitting in chair, ice applied to right hip. Pt likely fairly close to her functional baseline. Will continue to follow acutely to progress as tolerated.     Follow Up Recommendations Home health PT;Supervision/Assistance - 24 hour    Equipment Recommendations  None recommended by PT    Recommendations for Other Services       Precautions / Restrictions Precautions Precautions: Fall;Other (comment) Precaution Comments: R hemiplegia Restrictions Weight Bearing Restrictions: No      Mobility  Bed Mobility Overal bed mobility: Needs Assistance Bed Mobility: Supine to Sit     Supine to sit: Min assist     General bed mobility comments: MinA to pull up to sitting position  Transfers Overall transfer level: Needs assistance Equipment used: 1 person hand held assist Transfers: Sit to/from Stand Sit to Stand: Min assist         General transfer comment: MinA to rise to stand from edge of bed  Ambulation/Gait Ambulation/Gait assistance: Min assist;Mod assist;+2 safety/equipment Gait Distance (Feet): 10 Feet Assistive device: 1 person hand held assist Gait Pattern/deviations: Step-through pattern;Decreased  dorsiflexion - right;Decreased stride length;Narrow base of support;Decreased weight shift to right Gait velocity: decreased Gait velocity interpretation: <1.31 ft/sec, indicative of household ambulator General Gait Details: Pt with heavy reliance through L hand support, requiring up to modA due to one episode of lateral LOB.  Stairs            Wheelchair Mobility    Modified Rankin (Stroke Patients Only)       Balance Overall balance assessment: Needs assistance Sitting-balance support: Feet supported Sitting balance-Leahy Scale: Good     Standing balance support: Single extremity supported;During functional activity Standing balance-Leahy Scale: Poor Standing balance comment: reliant on single UE support                             Pertinent Vitals/Pain Pain Assessment: Faces Faces Pain Scale: Hurts a little bit Pain Location: R hip Pain Descriptors / Indicators: Sore Pain Intervention(s): Monitored during session    Home Living Family/patient expects to be discharged to:: Private residence Living Arrangements: Spouse/significant other Available Help at Discharge: Family;Available 24 hours/day           Home Equipment: Cane - quad;Wheelchair - manual Additional Comments: Difficult to obtain due to impaired communication    Prior Function Level of Independence: Needs assistance   Gait / Transfers Assistance Needed: Uses quad cane, typically requires supervision   ADL's / Homemaking Assistance Needed: requires assist from husband for ADL's        Hand Dominance        Extremity/Trunk Assessment   Upper Extremity Assessment Upper Extremity Assessment: Defer to OT evaluation    Lower Extremity Assessment Lower  Extremity Assessment: RLE deficits/detail;LLE deficits/detail RLE Deficits / Details: Residual deficits from prior stroke. Able to perform LAQ, marching EOB  LLE Deficits / Details: WFL       Communication   Communication:  Expressive difficulties  Cognition Arousal/Alertness: Awake/alert Behavior During Therapy: Flat affect Overall Cognitive Status: No family/caregiver present to determine baseline cognitive functioning                                 General Comments: Pt with expressive aphasia, difficult to understand with more than 1 word phrases, consistent with ~75% of yes/no questions.       General Comments      Exercises     Assessment/Plan    PT Assessment Patient needs continued PT services  PT Problem List Decreased strength;Decreased activity tolerance;Decreased balance;Decreased mobility;Decreased cognition;Pain       PT Treatment Interventions DME instruction;Gait training;Functional mobility training;Therapeutic activities;Therapeutic exercise;Balance training;Patient/family education    PT Goals (Current goals can be found in the Care Plan section)  Acute Rehab PT Goals Patient Stated Goal: did not state PT Goal Formulation: With patient Time For Goal Achievement: 10/05/19 Potential to Achieve Goals: Good    Frequency Min 3X/week   Barriers to discharge        Co-evaluation               AM-PAC PT "6 Clicks" Mobility  Outcome Measure Help needed turning from your back to your side while in a flat bed without using bedrails?: A Little Help needed moving from lying on your back to sitting on the side of a flat bed without using bedrails?: A Little Help needed moving to and from a bed to a chair (including a wheelchair)?: A Little Help needed standing up from a chair using your arms (e.g., wheelchair or bedside chair)?: A Little Help needed to walk in hospital room?: A Little Help needed climbing 3-5 steps with a railing? : A Lot 6 Click Score: 17    End of Session Equipment Utilized During Treatment: Gait belt Activity Tolerance: Patient tolerated treatment well Patient left: in chair;with call bell/phone within reach;with chair alarm set Nurse  Communication: Mobility status PT Visit Diagnosis: Pain;Difficulty in walking, not elsewhere classified (R26.2) Pain - Right/Left: Right Pain - part of body: Hip    Time: 2229-7989 PT Time Calculation (min) (ACUTE ONLY): 27 min   Charges:   PT Evaluation $PT Eval Moderate Complexity: 1 Mod PT Treatments $Therapeutic Activity: 8-22 mins          Wyona Almas, PT, DPT Acute Rehabilitation Services Pager [phone] Office [phone]   Deno Etienne 09/21/2019, 1:28 PM

## 2019-09-22 DIAGNOSIS — S8011XA Contusion of right lower leg, initial encounter: Secondary | ICD-10-CM

## 2019-09-22 DIAGNOSIS — S7001XA Contusion of right hip, initial encounter: Secondary | ICD-10-CM

## 2019-09-22 DIAGNOSIS — E1159 Type 2 diabetes mellitus with other circulatory complications: Secondary | ICD-10-CM

## 2019-09-22 DIAGNOSIS — D62 Acute posthemorrhagic anemia: Principal | ICD-10-CM

## 2019-09-22 DIAGNOSIS — W19XXXA Unspecified fall, initial encounter: Secondary | ICD-10-CM

## 2019-09-22 LAB — BASIC METABOLIC PANEL
Anion gap: 11 (ref 5–15)
BUN: 12 mg/dL (ref 8–23)
CO2: 23 mmol/L (ref 22–32)
Calcium: 8.7 mg/dL — ABNORMAL LOW (ref 8.9–10.3)
Chloride: 106 mmol/L (ref 98–111)
Creatinine, Ser: 0.89 mg/dL (ref 0.44–1.00)
GFR calc Af Amer: >60 mL/min (ref >60)
GFR calc non Af Amer: >60 mL/min (ref >60)
Glucose, Bld: 108 mg/dL — ABNORMAL HIGH (ref 70–99)
Potassium: 4.6 mmol/L (ref 3.5–5.1)
Sodium: 140 mmol/L (ref 135–145)

## 2019-09-22 LAB — CBC
HCT: 27.7 % — ABNORMAL LOW (ref 36.0–46.0)
Hemoglobin: 8.6 g/dL — ABNORMAL LOW (ref 12.0–15.0)
MCH: 32.8 pg (ref 26.0–34.0)
MCHC: 31 g/dL (ref 30.0–36.0)
MCV: 105.7 fL — ABNORMAL HIGH (ref 80.0–100.0)
Platelets: 159 10*3/uL (ref 150–400)
RBC: 2.62 MIL/uL — ABNORMAL LOW (ref 3.87–5.11)
RDW: 17.6 % — ABNORMAL HIGH (ref 11.5–15.5)
WBC: 5.8 10*3/uL (ref 4.0–10.5)
nRBC: 0 % (ref 0.0–0.2)

## 2019-09-22 LAB — GLUCOSE, CAPILLARY
Glucose-Capillary: 97 mg/dL (ref 70–99)
Glucose-Capillary: 116 mg/dL — ABNORMAL HIGH (ref 70–99)
Glucose-Capillary: 130 mg/dL — ABNORMAL HIGH (ref 70–99)

## 2019-09-22 MED ORDER — POTASSIUM CHLORIDE CRYS ER 20 MEQ PO TBCR: 20.0000 meq | EXTENDED_RELEASE_TABLET | Freq: Every day | ORAL | 0 refills | Status: DC | PRN

## 2019-09-22 MED ORDER — RIVAROXABAN 20 MG PO TABS: 20.0000 mg | ORAL_TABLET | Freq: Every day | ORAL | 3 refills | Status: AC

## 2019-09-22 MED ORDER — ACETAMINOPHEN 500 MG PO TABS: 1000.0000 mg | ORAL_TABLET | Freq: Three times a day (TID) | ORAL | 0 refills | Status: AC

## 2019-09-22 MED ORDER — FOLIC ACID 1 MG PO TABS: 2.0000 mg | ORAL_TABLET | Freq: Every day | ORAL | Status: AC

## 2019-09-22 MED ORDER — TRULICITY 0.75 MG/0.5ML ~~LOC~~ SOAJ: 0.7500 mg | Freq: Every day | SUBCUTANEOUS | Status: DC | PRN

## 2019-09-22 MED ORDER — FUROSEMIDE 20 MG PO TABS: 10.0000 mg | ORAL_TABLET | Freq: Every day | ORAL | Status: AC | PRN

## 2019-09-22 NOTE — Discharge Summary (Signed)
Physician Discharge Summary  [name] QPY:[id number] DOB: Jun 28, 1945 DOA: 09/20/2019  PCP: Dettinger, Fransisca Kaufmann, MD  Admit date: 09/20/2019 Discharge date: 09/22/2019  Admitted From: home Discharge disposition: home   Recommendations for Outpatient Follow-Up:   Ortho follow up PRN CBC on thursday Resume home health Outpatient follow up of incidental portal HTN finding  Discharge Diagnosis:   Active Problems:   Hypothyroidism   HTN (hypertension)   PAF (paroxysmal atrial fibrillation) (HCC)   Tachy-brady syndrome ([place])   Late effects of cerebral ischemic stroke   Type 2 diabetes mellitus ([place])   Aortic atherosclerosis ([place])   Paralysis of right lower extremity ([place])   Fall as cause of accidental injury at home as place of occurrence   Portal hypertension ([place])   Anemia   Acute blood loss anemia   Hip hematoma, right    Discharge Condition: Improved.  Diet recommendation: Low sodium, heart healthy.  Carbohydrate-modified.    Wound care: None.  Code status: Full.   History of Present Illness:   [name] is a 74 y.o. female with medical history significant of hypertension, type 2 diabetes, history of left CVA with resultant right-sided weakness, paroxysmal A. fib on Eliquis, hypothyroidism, psoriasis on methotrexate who presents having fallen today at home at approximately 1430.  The patient has residual left arm weakness and left leg weakness and uses a gait belt when trying to get around.  Her husband was gone to the store she got up by herself and fell to the right.  Her husband had her in the shower and noted a very large hematoma on her hip and so brought her initially to an urgent care and then was recommended to come to the ED.  The patient's from [facility] and they decided to come to [place] for care. ED Course: In the ED she was noted to have a negative CT of the head (with exception of her previous left CVA), and extensive right  contusion about the hip which by CT measured 11 x 5 x 3.5.  Additionally there was mesenteric stranding and reactive lymph nodes compatible with mesenteritis.  Hepatic nodularity which might be consistent with liver cirrhosis, prominent paraesophageal venous collaterals and varices noted consistent with portal hypertension, large fat periumbilical hernia, colonic diverticulosis, aortic atherosclerosis.  Chest x-ray and right shoulder x-ray showed possible tiny chip fractures of the right acromium.  Orthopedics was consulted and they recommended observation for possible expansion of her right hip hematoma, physical therapy eval.   Hospital Course by Problem:   Status post fall Has home PT already Stroke was approximately 7 years ago so has been coping with this for some time  hematoma/lesion of the right hip: extensive right contusion about the hip which by CT measured 11 x 5 x 3.5 Very large probably related to ongoing xarelto use No evidence of fracture -drop of Hgb 13.3--->8.7 but stable now -per ortho: Non surgical management of right hip hematoma Ice to right hip for pain and to assist with reducing swelling Activity as tolerable  History of left CVA with right hemiparesis -PT eval  Hypertension -resume home meds  Hypothyroid Continue Synthroid  UTI This is an ongoing issue for her primary care physician has called in this medication for her and she started on 728 finish ciprofloxacin  Depression On Celexa  Psoriasis On methotrexate, on hold for admission  Paroxysmal A. fib on xarelto Holding xarelto for now due to large hematoma -resume Wednesday  Type 2 diabetes -resume home meds -hgbA1c: 4.9  Portal hypertension Her AST is mildly elevated as well at 51 No history of this prior was seen on CT scan Can be followed up as an outpatient  Acute blood loss anemia -due to hip hematoma -stable -will need close outpatient follow up  Seizure disorder On  Keppra    Medical Consultants:   ortho   Discharge Exam:   Vitals:   09/22/19 0300 09/22/19 0747  BP: (!) 104/54 (!) 114/41  Pulse: 81 76  Resp: 18 18  Temp: 98.5 F (36.9 C) 98 F (36.7 C)  SpO2: 97% 100%   Vitals:   09/21/19 1740 09/21/19 1945 09/22/19 0300 09/22/19 0747  BP: (!) 99/46 (!) 108/54 (!) 104/54 (!) 114/41  Pulse: 80 83 81 76  Resp: 18 19 18 18   Temp: 97.7 F (36.5 C) 98.1 F (36.7 C) 98.5 F (36.9 C) 98 F (36.7 C)  TempSrc: Oral Oral Axillary Oral  SpO2: 99% 98% 97% 100%    General exam: Appears calm and comfortable..    The results of significant diagnostics from this hospitalization (including imaging, microbiology, ancillary and laboratory) are listed below for reference.     Procedures and Diagnostic Studies:   DG Chest 2 View  Result Date: 09/20/2019 CLINICAL DATA:  Fatigue and right shoulder pain EXAM: CHEST - 2 VIEW COMPARISON:  August 19, 2018 FINDINGS: There is mild cardiomegaly. Aortic knob calcifications. A left-sided pacemaker is seen. Both lungs are clear. Tiny ossific densities are seen superior to the acromion which could be tiny chip fractures. IMPRESSION: No active cardiopulmonary disease. Possible tiny chip fractures of the superior acromion Electronically Signed   By: Prudencio Pair M.D.   On: 09/20/2019 22:58   DG Shoulder Right  Result Date: 09/20/2019 CLINICAL DATA:  Right shoulder pain and fall EXAM: RIGHT SHOULDER - 2+ VIEW COMPARISON:  None. FINDINGS: Tiny ossific densities are seen at the distal acromion, likely chip fractures. Overlying soft tissue swelling is seen. There is diffuse osteopenia. IMPRESSION: Tiny ossific density seen at the superior acromion which could represent tiny chip fractures Electronically Signed   By: Prudencio Pair M.D.   On: 09/20/2019 23:03   CT Head Wo Contrast  Result Date: 09/20/2019 CLINICAL DATA:  Recent fall while on blood thinners, initial encounter EXAM: CT HEAD WITHOUT CONTRAST TECHNIQUE:  Contiguous axial images were obtained from the base of the skull through the vertex without intravenous contrast. COMPARISON:  None. FINDINGS: Brain: Changes consistent with prior left MCA infarct are noted. No findings to suggest acute hemorrhage, acute infarction or space-occupying mass lesion are seen. Vascular: No hyperdense vessel or unexpected calcification. Skull: Normal. Negative for fracture or focal lesion. Sinuses/Orbits: No acute finding. Other: None. IMPRESSION: Changes of prior left MCA infarct. No acute abnormality noted. Electronically Signed   By: Inez Catalina M.D.   On: 09/20/2019 21:55   CT ABDOMEN PELVIS W CONTRAST  Result Date: 09/20/2019 CLINICAL DATA:  Fall, on anticoagulation, right upper abdominal and hip pain EXAM: CT ABDOMEN AND PELVIS WITH CONTRAST TECHNIQUE: Multidetector CT imaging of the abdomen and pelvis was performed using the standard protocol following bolus administration of intravenous contrast. CONTRAST:  181mL OMNIPAQUE IOHEXOL 300 MG/ML  SOLN COMPARISON:  CT 06/07/2012 FINDINGS: Lower chest: Atelectatic changes in the lung bases with some abundant subpleural fat in the posterior lung bases. Cardiac size is borderline enlarged with leads at the right atrium and cardiac apex. Left atrial appendage occlusion device is noted  as well. No pericardial effusion or thickening. Hepatobiliary: Some mild heterogeneity of the liver, possibly attributable to contrast timing though could correlate with serologies. Mild nodular liver surface contour noted as well. No concerning focal liver abnormality. No direct hepatic injury or perihepatic hematoma. Prominent fold at the gallbladder neck. Gallbladder otherwise unremarkable without visible calcified gallstone. Mild prominence of the biliary tree is within normal limits with the common bile duct measuring up to 7 mm which is age-appropriate. No visible calcified intraductal gallstones. Pancreas: Mild pancreatic atrophy. No focal  peripancreatic inflammation or ductal dilatation Spleen: No direct splenic injury or perisplenic hematoma. Normal splenic size. No concerning splenic lesion Adrenals/Urinary Tract: No adrenal hemorrhage or suspicious adrenal lesions. No direct renal injury or perinephric hematoma. Kidneys enhance and excrete symmetrically. No concerning renal mass. No urolithiasis or hydronephrosis. Urinary bladder is largely decompressed at the time of exam and therefore poorly evaluated by CT imaging. Evidence of prior urethral sling. Stomach/Bowel: Prominent paraesophageal venous collaterals and varices are noted. Stomach is unremarkable. Paired air-filled duodenal diverticula are present, more proximal measuring 1.9 cm in size, more distal measuring up to 4 cm in size (coronal 7/53). Hyperattenuating focus within the large diverticulum is nonspecific, possibly ingested material. Distal duodenum is unremarkable with a normal course across the midline abdomen. No small bowel thickening or dilatation. No colonic dilatation or wall thickening. Scattered colonic diverticula without focal inflammation to suggest diverticulitis. Vascular/Lymphatic: Atherosclerotic calcifications within the abdominal aorta and branch vessels. No aneurysm or ectasia. No enlarged abdominopelvic lymph nodes.Focal region of mid mesenteric hazy stranding with numerous reactive appearing clustered mid mesenteric lymph nodes compatible with mesenteritis (3/55), new from prior. Reproductive: Uterus is surgically absent. No concerning adnexal lesions. Other: Large fat containing paraumbilical hernia with a 5.5 x 3.8 cm fascial defect (transverse by craniocaudal). No evidence of strangulation of the fatty internal contents. No bowel containing hernias. Musculoskeletal: Extensive right lateral hip swelling and contusive change with heterogeneous, intermediate attenuation hematoma over the lateral hip, incompletely included within the level of imaging given partial  collimation of the lateral soft tissues. Best seen on sagittal reconstructions measuring up to 11 cm in length, and up to 4.9 cm AP by greater than 3.5 cm transverse. No acute osseous injuries are evident. Multilevel degenerative changes are present in the imaged portions of the spine. Additional degenerative changes in the hips and pelvis surgical anchors noted at the pubic bodies. Benign bone island in the right femoral neck. IMPRESSION: 1. Extensive right lateral hip swelling and contusive change with heterogeneous, intermediate attenuation collection over the lateral hip, incompletely included within the level of imaging given partial collimation of the lateral soft tissues. Best seen on sagittal reconstructions measuring up to 11 cm in length, and up to 4.9 cm AP by greater than 3.5 cm transverse. Could reflect a simple hematoma or Sherry Ruffing type injury given size and location. No acute osseous injuries are evident. 2. No acute osseous injury of the included lumbar spine or bony pelvis. 3. Focal region of mid mesenteric hazy stranding with numerous reactive appearing clustered mid mesenteric lymph nodes compatible with sequela of mesenteritis, new from prior. 4. Hepatic heterogeneity and nodular liver surface contour, could reflect intrinsic liver disease/cirrhosis. Correlate with serology. 5. Prominent paraesophageal venous collaterals and varices are noted. Suggestive of portal hypertension. 6. Large fat containing paraumbilical hernia with a 5.5 x 3.8 cm fascial defect (transverse by craniocaudal). No evidence of strangulation of the fatty internal contents. 7. Colonic diverticulosis without evidence of diverticulitis.  8. Evidence of prior urethral sling. 9. Aortic Atherosclerosis (ICD10-I70.0). Electronically Signed   By: Lovena Le M.D.   On: 09/20/2019 22:27     Labs:   Basic Metabolic Panel: Recent Labs  Lab 09/20/19 2129 09/20/19 2129 09/20/19 2139 09/20/19 2139 09/21/19 0538  09/22/19 0724  NA 145  --  146*  --  141 140  K 4.3   < > 4.2   < > 4.8 4.6  CL 110  --  108  --  110 106  CO2 20*  --   --   --  19* 23  GLUCOSE 118*  --  108*  --  120* 108*  BUN 8  --  8  --  10 12  CREATININE 0.80  --  0.40*  --  0.77 0.89  CALCIUM 9.1  --   --   --  8.5* 8.7*   < > = values in this interval not displayed.   GFR CrCl cannot be calculated (Unknown ideal weight.). Liver Function Tests: Recent Labs  Lab 09/20/19 2129 09/21/19 0538  AST 51* 38  ALT 18 15  ALKPHOS 67 48  BILITOT 1.9* 1.9*  PROT 5.9* 4.9*  ALBUMIN 3.0* 2.5*   Recent Labs  Lab 09/20/19 2129  LIPASE 34   No results for input(s): AMMONIA in the last 168 hours. Coagulation profile No results for input(s): INR, PROTIME in the last 168 hours.  CBC: Recent Labs  Lab 09/20/19 2129 09/20/19 2139 09/21/19 0538 09/21/19 1908 09/22/19 0724  WBC 9.9  --  7.4  --  5.8  NEUTROABS 5.4  --   --   --   --   HGB 10.7* 12.6 8.7* 8.4* 8.6*  HCT 36.2 37.0 28.5* 27.2* 27.7*  MCV 109.7*  --  108.0*  --  105.7*  PLT 219  --  147*  --  159   Cardiac Enzymes: No results for input(s): CKTOTAL, CKMB, CKMBINDEX, TROPONINI in the last 168 hours. BNP: Invalid input(s): POCBNP CBG: Recent Labs  Lab 09/21/19 0639 09/21/19 1247 09/21/19 1815 09/21/19 2039 09/22/19 0654  GLUCAP 117* 100* 110* 116* 97   D-Dimer No results for input(s): DDIMER in the last 72 hours. Hgb A1c Recent Labs    09/21/19 0538  HGBA1C 4.9   Lipid Profile No results for input(s): CHOL, HDL, LDLCALC, TRIG, CHOLHDL, LDLDIRECT in the last 72 hours. Thyroid function studies No results for input(s): TSH, T4TOTAL, T3FREE, THYROIDAB in the last 72 hours.  Invalid input(s): FREET3 Anemia work up No results for input(s): VITAMINB12, FOLATE, FERRITIN, TIBC, IRON, RETICCTPCT in the last 72 hours. Microbiology Recent Results (from the past 240 hour(s))  SARS Coronavirus 2 by RT PCR (hospital order, performed in [facility]  hospital lab) Nasopharyngeal Nasopharyngeal Swab     Status: None   Collection Time: 09/21/19  2:03 AM   Specimen: Nasopharyngeal Swab  Result Value Ref Range Status   SARS Coronavirus 2 NEGATIVE NEGATIVE Final    Comment: (NOTE) SARS-CoV-2 target nucleic acids are NOT DETECTED.  The SARS-CoV-2 RNA is generally detectable in upper and lower respiratory specimens during the acute phase of infection. The lowest concentration of SARS-CoV-2 viral copies this assay can detect is 250 copies / mL. A negative result does not preclude SARS-CoV-2 infection and should not be used as the sole basis for treatment or other patient management decisions.  A negative result may occur with improper specimen collection / handling, submission of specimen other than nasopharyngeal swab, presence of  viral mutation(s) within the areas targeted by this assay, and inadequate number of viral copies (<250 copies / mL). A negative result must be combined with clinical observations, patient history, and epidemiological information.  Fact Sheet for Patients:   StrictlyIdeas.no  Fact Sheet for Healthcare Providers: BankingDealers.co.za  This test is not yet approved or  cleared by the [place] FDA and has been authorized for detection and/or diagnosis of SARS-CoV-2 by FDA under an Emergency Use Authorization (EUA).  This EUA will remain in effect (meaning this test can be used) for the duration of the COVID-19 declaration under Section 564(b)(1) of the Act, 21 U.S.C. section 360bbb-3(b)(1), unless the authorization is terminated or revoked sooner.  Performed at [facility], [place] [address], [place], [place] [address]      Discharge Instructions:   Discharge Instructions    Diet Carb Modified   Complete by: As directed    Discharge instructions   Complete by: As directed    CBC Thursday to check Hgb Resume home health   Increase activity  slowly   Complete by: As directed      Allergies as of 09/22/2019      Reactions   Latex Rash   Caudal Tray [lidocaine-epinephrine]    Edema    Codeine Nausea Only   Diovan [valsartan] Itching   Doxycycline Other (See Comments)   Might have caused heart to race per pt - not sure   Esomeprazole Magnesium    Headache    Levofloxacin    Insomnia    Myrbetriq [mirabegron] Diarrhea   Neomycin-bacitracin Zn-polymyx    rash   Penicillins    hives   Sulfa Drugs Cross Reactors Other (See Comments)   Pt not sure what reaction was   Verapamil    Edema   Vimovo [naproxen-esomeprazole]    Upset stomach      Medication List    STOP taking these medications   ciprofloxacin 500 MG tablet Commonly known as: Cipro   traMADol 50 MG tablet Commonly known as: ULTRAM     TAKE these medications   acetaminophen 500 MG tablet Commonly known as: TYLENOL Take 2 tablets (1,000 mg total) by mouth 3 (three) times daily for 4 days. What changed:   how much to take  when to take this  reasons to take this   calcium-vitamin D 500-200 MG-UNIT tablet Take 1 tablet by mouth daily.   cholecalciferol 1000 units tablet Commonly known as: VITAMIN D Take 2,000 Units by mouth daily.   citalopram 20 MG tablet Commonly known as: CELEXA Take 1 tablet (20 mg total) by mouth 2 (two) times daily.   cyclobenzaprine 5 MG tablet Commonly known as: FLEXERIL Take 1 tablet (5 mg total) by mouth 3 (three) times daily as needed for muscle spasms.   dicyclomine 10 MG capsule Commonly known as: BENTYL TAKE 1 CAPSULE BY MOUTH FOUR TIMES DAILY, BEFORE MEALS AND AT BEDTIME What changed: See the new instructions.   EYE VITAMINS PO Take 2 tablets by mouth daily. AREDS formula   fluticasone 50 MCG/ACT nasal spray Commonly known as: FLONASE INSTILL 2 SPRAYS INTO EACH NOSTRIL EVERY DAY What changed: See the new instructions.   folic acid 1 MG tablet Commonly known as: FOLVITE Take 2 tablets (2 mg  total) by mouth daily. What changed: See the new instructions.   furosemide 20 MG tablet Commonly known as: LASIX Take 0.5 tablets (10 mg total) by mouth daily as needed for fluid.  levETIRAcetam 500 MG tablet Commonly known as: KEPPRA TAKE 1 TABLET BY MOUTH TWICE A DAY   levothyroxine 88 MCG tablet Commonly known as: SYNTHROID Take 1 tablet (88 mcg total) by mouth daily before breakfast.   loperamide 2 MG capsule Commonly known as: IMODIUM Take 4 mg by mouth daily as needed for diarrhea or loose stools (Take as directed). Reported on 05/17/2015   metFORMIN 500 MG 24 hr tablet Commonly known as: GLUCOPHAGE-XR TAKE 2 TABLETS (1,000MG ) BY MOUTH TWICE A DAY AFTER A MEAL What changed: See the new instructions.   methotrexate 2.5 MG tablet Commonly known as: RHEUMATREX Take 4 tablets (10 mg total) by mouth once a week. Caution:Chemotherapy. Protect from light.   metoprolol tartrate 50 MG tablet Commonly known as: LOPRESSOR Take 1 tablet (50 mg total) by mouth 2 (two) times daily.   mirtazapine 15 MG tablet Commonly known as: REMERON Take 1 tablet (15 mg total) by mouth at bedtime.   nystatin powder Generic drug: nystatin APPLY TWICE DAILY UNDERNEATH BREASTS   omeprazole 40 MG capsule Commonly known as: PRILOSEC Take 1 capsule (40 mg total) by mouth daily.   OneTouch Ultra test strip Generic drug: glucose blood Test BS daily Dx E11.9   potassium chloride SA 20 MEQ tablet Commonly known as: Klor-Con M20 Take 1 tablet (20 mEq total) by mouth daily as needed (when taking lasix). What changed: See the new instructions.   PROBIOTIC PO Take 1 capsule by mouth daily.   rivaroxaban 20 MG Tabs tablet Commonly known as: Xarelto Take 1 tablet (20 mg total) by mouth daily. Start taking on: September 24, 2019 What changed: These instructions start on September 24, 2019. If you are unsure what to do until then, ask your doctor or other care provider.   simvastatin 40 MG  tablet Commonly known as: ZOCOR TAKE 1 TABLET BY MOUTH EVERY DAY   Trulicity 6.44 IH/4.7QQ Sopn Generic drug: Dulaglutide Inject 0.5 mLs (0.75 mg total) into the skin daily as needed (For blood sugar).   VITAMIN C PO Take 1 tablet by mouth daily.       Follow-up Information    Dettinger, Fransisca Kaufmann, MD Follow up in 1 week(s).   Specialties: Family Medicine, Cardiology Why: CBC on Thursday Contact information: [place] [address] [phone]                Time coordinating discharge: 35 min  Signed:  Geradine Girt DO  [facility] 09/22/2019, 9:42 AM

## 2019-09-22 NOTE — Evaluation (Signed)
Occupational Therapy Evaluation and Discharge Patient Details Name: [name] MRN: [id number] DOB: 05-09-1945 Today's Date: 09/22/2019    History of Present Illness Pt is a 74 y.o. F with significant PMH of HTN, type 2 diabetes, seizure disorder, CVA with right residual weakness, A. fib, psoriasis, who presents after a fall with right large hip hematoma. Plan for nonoperative treatment.   Clinical Impression   Pt likely functioning near her baseline. No OT needs.    Follow Up Recommendations  No OT follow up    Equipment Recommendations  None recommended by OT    Recommendations for Other Services       Precautions / Restrictions Precautions Precautions: Fall Precaution Comments: R hemiplegia      Mobility Bed Mobility Overal bed mobility: Needs Assistance Bed Mobility: Supine to Sit     Supine to sit: Min assist     General bed mobility comments: MinA to pull up to sitting position  Transfers Overall transfer level: Needs assistance Equipment used: 1 person hand held assist Transfers: Sit to/from [facility] Sit to Stand: Min assist Stand pivot transfers: Min assist       General transfer comment: assist to rise and steady    Balance Overall balance assessment: Needs assistance   Sitting balance-Leahy Scale: Good       Standing balance-Leahy Scale: Poor                             ADL either performed or assessed with clinical judgement   ADL Overall ADL's : At baseline                                             Vision         Perception     Praxis      Pertinent Vitals/Pain Pain Assessment: No/denies pain     Hand Dominance Left   Extremity/Trunk Assessment Upper Extremity Assessment Upper Extremity Assessment: RUE deficits/detail RUE Deficits / Details: longstanding hemiplegia RUE Coordination: decreased fine motor;decreased gross motor   Lower Extremity Assessment Lower  Extremity Assessment: Defer to PT evaluation       Communication Communication Communication: Expressive difficulties   Cognition Arousal/Alertness: Awake/alert Behavior During Therapy: WFL for tasks assessed/performed Overall Cognitive Status: No family/caregiver present to determine baseline cognitive functioning                                 General Comments: intact for activities assessed   General Comments       Exercises     Shoulder Instructions      Home Living Family/patient expects to be discharged to:: Private residence Living Arrangements: Spouse/significant other Available Help at Discharge: Family;Available 24 hours/day                         Home Equipment: Cane - quad;Wheelchair - manual          Prior Functioning/Environment Level of Independence: Needs assistance  Gait / Transfers Assistance Needed: Uses quad cane, typically requires supervision  ADL's / Homemaking Assistance Needed: self feeds and grooms, assisted for bathing, dressing, toileting and IADL            OT Problem List:  OT Treatment/Interventions:      OT Goals(Current goals can be found in the care plan section) Acute Rehab OT Goals Patient Stated Goal: go home  OT Frequency:     Barriers to D/C:            Co-evaluation              AM-PAC OT "6 Clicks" Daily Activity     Outcome Measure Help from another person eating meals?: A Little Help from another person taking care of personal grooming?: A Little Help from another person toileting, which includes using toliet, bedpan, or urinal?: A Little Help from another person bathing (including washing, rinsing, drying)?: A Lot Help from another person to put on and taking off regular upper body clothing?: A Lot Help from another person to put on and taking off regular lower body clothing?: A Lot 6 Click Score: 15   End of Session    Activity Tolerance: Patient tolerated treatment  well Patient left: in chair;with call bell/phone within reach;with chair alarm set  OT Visit Diagnosis: Other abnormalities of gait and mobility (R26.89)                Time: 4259-5638 OT Time Calculation (min): 26 min Charges:  OT General Charges $OT Visit: 1 Visit OT Evaluation $OT Eval Moderate Complexity: 1 Mod OT Treatments $Self Care/Home Management : 8-22 mins  Nestor Lewandowsky, OTR/L Acute Rehabilitation Services Pager: [phone] Office: [phone]  [name] 09/22/2019, 10:17 AM

## 2019-09-22 NOTE — Progress Notes (Signed)
Discharge instructions given. Patient verbalized understanding and all questions were answered.  ?

## 2019-09-22 NOTE — Plan of Care (Signed)
  Problem: Clinical Measurements: Goal: Ability to maintain clinical measurements within normal limits will improve Outcome: Progressing Goal: Will remain free from infection Outcome: Progressing Goal: Respiratory complications will improve Outcome: Progressing Goal: Cardiovascular complication will be avoided Outcome: Progressing   Problem: Pain Managment: Goal: General experience of comfort will improve Outcome: Progressing   Problem: Skin Integrity: Goal: Risk for impaired skin integrity will decrease Outcome: Progressing

## 2019-09-22 NOTE — TOC Transition Note (Signed)
Transition of Care [facility]) - CM/SW Discharge Note   Patient Details  Name: ROLENE ANDRADES MRN: [id number] Date of Birth: 04-Feb-1946  [facility] CM/SW Contact:  Curlene Labrum, RN Phone Number: 09/22/2019, 11:46 AM   Clinical Narrative:    Case Management met with the patient and husband prior to discharge relating to S/P fall and Right hip hematoma.  The patient's husband states that the patient was receiving home health PT,OT and ST through [facility] prior to patient's hospitalization.  The patient will need continued services and [facility] was called and all services were continued and orders placed.  Patient's clinicals were faxed to [facility] in [place], [place] for continued services and the family was notified.  Will continue to follow and patient's husband states that the patient has all needed dme for home.   Final next level of care: [place] Barriers to Discharge: Continued Medical Work up   Patient Goals and CMS Choice Patient states their goals for this hospitalization and ongoing recovery are:: husband says to get better CMS Medicare.gov Compare Post Acute Care list provided to:: Patient Represenative (must comment) (husband) Choice offered to / list presented to : Spouse  Discharge Placement                       Discharge Plan and Services   Discharge Planning Services: CM Consult Post Acute Care Choice: Home Health, Resumption of Svcs/PTA Provider          DME Arranged: N/A DME Agency: NA         HH Agency: [facility]        Social Determinants of Health (SDOH) Interventions     Readmission Risk Interventions Readmission Risk Prevention Plan 09/22/2019  Transportation Screening Complete  PCP or Specialist Appt within 5-7 Days Complete  Home Care Screening Complete  Medication Review (RN CM) Complete  Some recent data might be hidden

## 2019-09-23 ENCOUNTER — Telehealth: Payer: Self-pay | Admitting: *Deleted

## 2019-09-23 NOTE — Telephone Encounter (Signed)
TC with Tasha from [facility] Pt recently discharged from hospital after fall, hematoma on hip Pt requesting to go back on appetite stimulant medication that she was recently taken off of

## 2019-09-23 NOTE — Telephone Encounter (Signed)
Are they speaking of mirtazapine, if that is the case he can send a refill for it, if not, I would have to know exactly which medication was discontinued.

## 2019-09-24 NOTE — Telephone Encounter (Signed)
I cannot think of an appetite stimulant that needs to be refrigerated unless they are talking about boost or Ensure which are the milk shake drinks, if she wants that then we can give it to her.  Every other appetite stimulant that I can think of is not refrigerated unless they are thinking of an appetite suppressant, but I think they are thinking of something to try and increase her appetite not decrease it.

## 2019-09-24 NOTE — Telephone Encounter (Signed)
Dr. Laurance Flatten stopped it a while ago all husband could tell me was that it had to be refrigerated.

## 2019-09-24 NOTE — Telephone Encounter (Signed)
Patient aware and verbalized understanding. He states he that is not it he has been trying to give her that, but if you know of a different type of medication that could help her he is willing to try whatever you suggest.

## 2019-09-25 NOTE — Telephone Encounter (Signed)
I would say to go ahead at first and use what she has, take the mirtazapine" and take 2 in the evenings and see if that helps over the next week or 2 and if it does then we can increase the prescription.

## 2019-09-25 NOTE — Telephone Encounter (Signed)
Husband aware and agreeable

## 2019-09-28 ENCOUNTER — Other Ambulatory Visit: Payer: Self-pay | Admitting: Family Medicine

## 2019-09-29 ENCOUNTER — Telehealth: Payer: Self-pay | Admitting: Family Medicine

## 2019-09-29 ENCOUNTER — Other Ambulatory Visit: Payer: Self-pay | Admitting: Family Medicine

## 2019-09-29 NOTE — Telephone Encounter (Signed)
Pts husband called stating that pt has been at home, out of the hospital, for about a week now. Was told by doctor at hospital that pt needed to start back on home health. Husband wants to know if Dr Dettinger has to put in an order for pt to start [place] again? Says he hasnt heard anything from home health since they've been home.

## 2019-09-29 NOTE — Telephone Encounter (Signed)
TC to [facility] [place] to see if she is of resumption of care [place] w/ [facility] clinical mgr Nursing is 1 wk, PT & OT to evaluate too  Call to [place] and gave this information to him

## 2019-09-29 NOTE — Telephone Encounter (Signed)
Pt's husband states [facility] nurse came out and did re-evaluation but hasn't heard anything else from them since that visit. Have you seen anything regarding this pt.

## 2019-10-02 ENCOUNTER — Encounter: Payer: Self-pay | Admitting: Neurology

## 2019-10-02 ENCOUNTER — Ambulatory Visit (INDEPENDENT_AMBULATORY_CARE_PROVIDER_SITE_OTHER): Payer: Medicare Other | Admitting: Neurology

## 2019-10-02 VITALS — BP 122/60 | HR 80 | Ht 64.0 in | Wt 148.0 lb

## 2019-10-02 DIAGNOSIS — R718 Other abnormality of red blood cells: Secondary | ICD-10-CM | POA: Diagnosis not present

## 2019-10-02 DIAGNOSIS — Z8673 Personal history of transient ischemic attack (TIA), and cerebral infarction without residual deficits: Secondary | ICD-10-CM | POA: Diagnosis not present

## 2019-10-02 DIAGNOSIS — I69398 Other sequelae of cerebral infarction: Secondary | ICD-10-CM | POA: Diagnosis not present

## 2019-10-02 DIAGNOSIS — W19XXXA Unspecified fall, initial encounter: Secondary | ICD-10-CM

## 2019-10-02 DIAGNOSIS — I480 Paroxysmal atrial fibrillation: Secondary | ICD-10-CM

## 2019-10-02 DIAGNOSIS — E538 Deficiency of other specified B group vitamins: Secondary | ICD-10-CM | POA: Diagnosis not present

## 2019-10-02 DIAGNOSIS — R569 Unspecified convulsions: Secondary | ICD-10-CM

## 2019-10-02 DIAGNOSIS — N390 Urinary tract infection, site not specified: Secondary | ICD-10-CM

## 2019-10-02 NOTE — Progress Notes (Signed)
[facility]    Provider:  Dr Jaynee Eagles Referring Provider: Dettinger, Fransisca Kaufmann, MD Primary Care Physician:  Dettinger, Fransisca Kaufmann, MD   CC: Stroke follow up  Interval update 10/02/2019: Patient has had recent falls and UTI. She ws recently hospitalized. Repeat CT head showed nothing acute. She fell. He heard her fall. She had a big hematoma on her hip, She is still weak, she has problems standing and walking. She is going to physical therapy starting Monday. She is battling UTIs. On keppra for seizure. We discussed UTIs in the elderly, also discussed not getting up alone and safety in the home. We reviewed CT of the head without acute events, her hospitalization, discussed her exam is stable today, her deficits likely due to matabolic/infectious causes and disuse.  Interval update 07/19/2016: She has macular degeneration and vision changes since the stroke and they saw a physician and tried glasses. In the last year vision is worsening. She can't see as well. Last year she couldn't pass the eye exam. Her glasses did not help. She sees blurry all the time not worse in the morning or worse at night, slowly worsening over the last year, no pain on eye movement, continuous. No ptosis. No other new weakness. Just blurry vision. If she closes one eye the It is the left eye that is blurry and worsening. Her macular degeneration is worsening. The blurriness does not improve with closing one eye. Recommend an MRi of the braina nd further workup but they want to see ophthalmology first and let me know how they want to proceed. No seizures. She continues to be depressed and less social, but she denies significant changes in mood from last year. She had depression even beofre the stroke, she is a home body and husband wants to get out. She declines any more PT. Her speech is improving.   Interval update 07/20/2015; Speech is much better. Everything else is the same. Speech therapist concentrated on  her muscles in her throat and she still has a difficult time swallowing but not choking on food. Husband says he wants to see her smile more. Her mood is improved. Walking is good. Right arm improve din strength, 3/5 and has some grip. She has PLMS. Discussed driving at length, she should have a formal motor vehicle evaluation before driving on the road.  Interval update 01/18/2015: Discussed recent eeg that was abnormal. She is on the keppra and appears to be well controlled. She continues to struggle with depression. I highly recommend therapy and psychiatry. Spoke with them at length about this.  IMPRESSION:  Abnormal EEG in awake state demonstrating: 1. Frequent left fronto-temporal sharp waves noted over F7 and T3 electrodes. 2. Mild left hemispheric slowing in the 6-7 Hz range. 3. No electrographic seizures are seen.  Interval update 10/19/2014:  Discussed records from [place] at length, I reviewed over 700 pages of records in total of patient's inpatient, acute rehab and subacute rehab stays. Patient's husband had a lot of questions about her seizures, EEGs and subsequent treatment with antiepileptic drugs after her stroke. Discussed this in detail with him please see addendum which does show that patient had episodes of alteration and confusion, epileptiform activity and seizures confirmed on EEG. She is not having episodes of confusion since then. No episodes of staring, non-purposeful movements or staring episodes or anything concerning for ongoing partial seizures. However she says that she can't remember coming down here in May. Her long term memory is good. She can  remember things that she did a long time ago. She can't remember things that happened yesterday, doesn't remember people that they talk to since she had the stroke. All these symptoms have started since the stroke. She isn't very social. She does have a long history of mood disorder. She does want to see her family and is  interested in seeing her grandkids. She is not very interested in other things. She is not positive, she is frequently sad. She doesn't laugh very often. Discussed that considering her large stroke, she is at high risk for continued seizures and I recommend continued Rep she is not having side effects. We could repeat EEG and try weaning her off the [place] if they feel strongly about trying to discontinue medication. However I suggest follow up with Dr. Erlinda Hong who is a stroke specialist. Two sleep studies were negative.    Initial visit 07/15/2014: [name] is a 74 y.o. female here as a referral from Dr. Hassell Done for stroke. She has a past medical history of hypertension, diabetes, high cholesterol, atrial fibrillation, depression, anxiety, hypothyroidism, peripheral neuropathy. She sees Dr. Rayann Heman as her cardiologist and family speaks very highly of him, for paroxysmal A. Fib.. She is here with her husband and son who provide most of the information. Husband is very concerned as he feels patient's memory is affected, her mood is poor, he is unsure why she was treated for seizures in the past and whether she even has seizures. Stroke was in February 20 15th. Patient suffered a stroke during ablation for atrial fibrillation. She was taken to [facility] and was admitted for stroke. She was in rehab at [facility] after the stroke. Then she went to [facility] for rehabilitation and was diagnosed with seizures. She was seeing a neurologist in [place] and had multiple EEGs and husband is unsure if she was being treated appropriately. She has residual right-sided weakness and aphasia. Per husband and son, she has physically significantly improved since her stroke. She is in physical therapy, occupational therapy as well as speech therapy.   However the biggest concern is her mood. They feel she has depression. When patient is asked if she is depressed she nods yes.  Some weeks are better than others but she has lost interest in doing activities that she liked, she appears very fatigued, disinterest in activities, sad most days. She gets frustrated with phones, she won't use a computer, she gets flustered. Her mood is variable, sometimes she is happier than other times. Motivation fluctuates. She doesn't want to go out of the house. Son's wife spends time with patient and is worried about patient's outlook.. Family Questions on whether or not this is associated with stroke. They're very concerned and would like to help patient. She has lost weight and has decreased appetite.   Review of Systems: Patient complains of symptoms per HPI as well as the following symptoms: UTI, falls, depression, aphasia, decreased appetite . Pertinent negatives and positives per HPI. All others negative   Social History   Socioeconomic History  . Marital status: Married    Spouse name: [name]"  . Number of children: 2  . Years of education: 14 yrs  . Highest education level: Associate degree: academic program  Occupational History  . 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  Tobacco Use  . Smoking status: Never Smoker  . Smokeless tobacco: Never Used  Vaping Use  . Vaping Use: Never used  Substance and Sexual Activity  . Alcohol use: No  . Drug use: No  . Sexual activity: Yes  Other Topics Concern  . Not on file  Social History Narrative   Lives in [place].      Left-handed   Caffeine use: drinks coffee/tea: 2 cups coffee in the morning, drinks 1 glass tea per day    Social Determinants of Health   Financial Resource Strain:   . Difficulty of Paying Living Expenses:   Food Insecurity:   . Worried About Charity fundraiser in the Last Year:   . Arboriculturist in the Last Year:   Transportation Needs:   . Film/video editor (Medical):   Marland Kitchen Lack of Transportation (Non-Medical):   Physical Activity:   . Days of Exercise per  Week:   . Minutes of Exercise per Session:   Stress:   . Feeling of Stress :   Social Connections:   . Frequency of Communication with Friends and Family:   . Frequency of Social Gatherings with Friends and Family:   . Attends Religious Services:   . Active Member of Clubs or Organizations:   . Attends Archivist Meetings:   Marland Kitchen Marital Status:   Intimate Partner Violence:   . Fear of Current or Ex-Partner:   . Emotionally Abused:   Marland Kitchen Physically Abused:   . Sexually Abused:     Family History  Problem Relation Age of Onset  . Diabetes Mother   . Heart disease Mother   . Heart failure Mother   . Macular degeneration Mother   . Heart disease Father   . Breast cancer Sister   . Ovarian cancer Sister   . Uterine cancer Paternal Aunt   . Crohn's disease Other        neice  . Diabetes Maternal Grandmother   . Diabetes Sister   . Heart disease Brother   . Heart disease Brother   . Macular degeneration Brother   . Colon cancer Neg Hx   . Stomach cancer Neg Hx     Past Medical History:  Diagnosis Date  . Anemia   . Bronchial spasms   . Chronic anxiety   . Chronic kidney disease   . Complication of anesthesia    " I shake real bad "  . Diverticulosis   . Elevated blood sugar   . Esophageal stricture   . Family history of anesthesia complication    Daughter also shakes while waking up  . GERD (gastroesophageal reflux disease)   . H/O hiatal hernia   . H/O scarlet fever   . History of bladder repair surgery   . Hx of vaginal hysterectomy   . Hypertension   . Hypothyroidism   . IBS (irritable bowel syndrome)    vs diarrhea vs abd. fullness   . Impaired glucose tolerance   . Neuromuscular disorder ([place])    periferal neuropathy  . Pacemaker    St. Jude  . Paroxysmal atrial fibrillation (HCC)    a. failed flecainide, tikosyn, amio;  b. 01/2012 s/p RFCA.  . Psoriasis   . Psoriasis   . Rectocele, female   . Seizures ([place])   . Stroke ([place])   . Tachy-brady  syndrome ([place])    a. 08/19/2008 s/p PPM: SJM 2110 Accent  . Uterine prolapse   . Vaginal prolapse 1998    Past Surgical History:  Procedure Laterality Date  . ATRIAL FIBRILLATION ABLATION  02/06/12   PVI by Dr  Allred  . ATRIAL FIBRILLATION ABLATION  07/04/2012   repeat PVI by Dr Rayann Heman  . ATRIAL FIBRILLATION ABLATION N/A 02/06/2012   Procedure: ATRIAL FIBRILLATION ABLATION;  Surgeon: Thompson Grayer, MD;  Location: [facility] CATH LAB;  Service: Cardiovascular;  Laterality: N/A;  . ATRIAL FIBRILLATION ABLATION N/A 07/04/2012   Procedure: ATRIAL FIBRILLATION ABLATION;  Surgeon: Thompson Grayer, MD;  Location: [facility] CATH LAB;  Service: Cardiovascular;  Laterality: N/A;  . BLADDER SUSPENSION    . INSERT / REPLACE / REMOVE PACEMAKER     SJM  . LEFT HEART CATHETERIZATION WITH CORONARY ANGIOGRAM N/A 03/24/2013   Procedure: LEFT HEART CATHETERIZATION WITH CORONARY ANGIOGRAM;  Surgeon: Blane Ohara, MD;  Location: [facility] CATH LAB;  Service: Cardiovascular;  Laterality: N/A;  . RECTOCELE REPAIR    . TEE WITHOUT CARDIOVERSION  02/06/2012   Procedure: TRANSESOPHAGEAL ECHOCARDIOGRAM (TEE);  Surgeon: Thayer Headings, MD;  Location: [facility];  Service: Cardiovascular;  Laterality: N/A;  . TEE WITHOUT CARDIOVERSION N/A 07/04/2012   Procedure: TRANSESOPHAGEAL ECHOCARDIOGRAM (TEE);  Surgeon: Lelon Perla, MD;  Location: [facility] ENDOSCOPY;  Service: Cardiovascular;  Laterality: N/A;  . TRANSTHORACIC ECHOCARDIOGRAM  2008  . VAGINAL HYSTERECTOMY     prolapse     Current Outpatient Medications  Medication Sig Dispense Refill  . acetaminophen (TYLENOL) 500 MG tablet Take 500 mg by mouth as needed.    . Ascorbic Acid (VITAMIN C PO) Take 1 tablet by mouth daily.    . Calcium Carbonate-Vitamin D (CALCIUM-VITAMIN D) 500-200 MG-UNIT per tablet Take 1 tablet by mouth daily.    . cholecalciferol (VITAMIN D) 1000 UNITS tablet Take 2,000 Units by mouth daily.     . citalopram (CELEXA) 20 MG tablet Take 1 tablet (20 mg total) by  mouth 2 (two) times daily. 180 tablet 3  . cyclobenzaprine (FLEXERIL) 5 MG tablet Take 1 tablet (5 mg total) by mouth 3 (three) times daily as needed for muscle spasms. 30 tablet 0  . dicyclomine (BENTYL) 10 MG capsule TAKE 1 CAPSULE BY MOUTH FOUR TIMES A DAY BEFORE MEALS AND AT BEDTIME 360 capsule 3  . Dulaglutide (TRULICITY) 2.42 PN/3.6RW SOPN Inject 0.5 mLs (0.75 mg total) into the skin daily as needed (For blood sugar).    . fluticasone (FLONASE) 50 MCG/ACT nasal spray INSTILL 2 SPRAYS INTO EACH NOSTRIL EVERY DAY (Patient taking differently: Place 2 sprays into both nostrils daily. ) 16 mL 11  . folic acid (FOLVITE) 1 MG tablet Take 2 tablets (2 mg total) by mouth daily.    . furosemide (LASIX) 20 MG tablet Take 0.5 tablets (10 mg total) by mouth daily as needed for fluid.    Marland Kitchen KLOR-CON M20 20 MEQ tablet TAKE 2 TABLETS BY MOUTH 2 TIMES A DAY (Patient taking differently: Take 20 mEq by mouth in the morning and at bedtime. ) 360 tablet 0  . levETIRAcetam (KEPPRA) 500 MG tablet TAKE 1 TABLET BY MOUTH TWICE A DAY (Patient taking differently: Take 500 mg by mouth 2 (two) times daily. ) 180 tablet 3  . levothyroxine (SYNTHROID) 88 MCG tablet Take 1 tablet (88 mcg total) by mouth daily before breakfast. 90 tablet 3  . loperamide (IMODIUM) 2 MG capsule Take 4 mg by mouth daily as needed for diarrhea or loose stools (Take as directed). Reported on 05/17/2015    . metFORMIN (GLUCOPHAGE-XR) 500 MG 24 hr tablet TAKE 2 TABLETS (1,000MG ) BY MOUTH TWICE A DAY AFTER A MEAL (Patient taking differently: Take 1,000 mg by mouth in the  morning and at bedtime. ) 360 tablet 0  . methotrexate (RHEUMATREX) 2.5 MG tablet Take 4 tablets (10 mg total) by mouth once a week. Caution:Chemotherapy. Protect from light. 16 tablet 3  . metoprolol tartrate (LOPRESSOR) 50 MG tablet Take 1 tablet (50 mg total) by mouth 2 (two) times daily. 180 tablet 3  . Multiple Vitamins-Minerals (EYE VITAMINS PO) Take 2 tablets by mouth daily. AREDS  formula    . NYSTATIN powder APPLY TWICE DAILY UNDERNEATH BREASTS 60 g 0  . omeprazole (PRILOSEC) 40 MG capsule Take 1 capsule (40 mg total) by mouth daily. 90 capsule 3  . Probiotic Product (PROBIOTIC PO) Take 1 capsule by mouth daily.    . rivaroxaban (XARELTO) 20 MG TABS tablet Take 1 tablet (20 mg total) by mouth daily. 90 tablet 3  . simvastatin (ZOCOR) 40 MG tablet TAKE 1 TABLET BY MOUTH EVERY DAY 90 tablet 0  . diclofenac Sodium (VOLTAREN) 1 % GEL Apply 2 g topically 4 (four) times daily. 350 g 3  . glucose blood (ONETOUCH ULTRA) test strip Test BS daily Dx E11.9 100 strip 3  . mirtazapine (REMERON) 30 MG tablet Take 1 tablet (30 mg total) by mouth at bedtime. 90 tablet 3   No current facility-administered medications for this visit.    Allergies as of 10/02/2019 - Review Complete 10/02/2019  Allergen Reaction Noted  . Latex Rash 06/27/2012  . Caudal tray [lidocaine-epinephrine]  06/22/2010  . Codeine Nausea Only   . Diovan [valsartan] Itching 06/22/2010  . Doxycycline Other (See Comments)   . Esomeprazole magnesium  06/22/2010  . Levofloxacin  06/22/2010  . Myrbetriq [mirabegron] Diarrhea 12/01/2013  . Neomycin-bacitracin zn-polymyx    . Penicillins    . Sulfa drugs cross reactors Other (See Comments) 06/22/2010  . Verapamil  06/22/2010  . Vimovo [naproxen-esomeprazole]  06/22/2010    Vitals: BP 122/60 (BP Location: Left Arm, Patient Position: Sitting)   Pulse 80   Ht 5\' 4"  (1.626 m)   Wt 148 lb (67.1 kg)   BMI 25.40 kg/m  Last Weight:  Wt Readings from Last 1 Encounters:  10/02/19 148 lb (67.1 kg)   Last Height:   Ht Readings from Last 1 Encounters:  10/02/19 5\' 4"  (1.626 m)    CV: RRR Gen: NAD, flat affect Swelling distally right > left LE Speech:  Expressive aphasia, comprehension appears to be intact Cognition:  The patient is oriented to person, month, year, day Cranial Nerves: pupil on the left 67mm and rigt 37mm appears sympathetic abnormality  on the left.slightly impaired upgaze otherwise  EOMI. visual fields are full to finger confrontation.Trigeminal sensation is intact and the muscles of mastication are normal. Right lower facial droop. Tongue deviates to the right. The palate elevates in the midline. Hearing intact. Voice is normal. Shoulder shrug is normal. The tongue has normal motion without fasciculations.   Coordination:  No dysmetria on the left, cannot perform on the right due to weakness  Gait:  With wheelchair. Cannot stand independently  Motor Observation:  No asymmetry, no atrophy, and no involuntary movements noted. Tone:  increased tone on the right with right finger flexion  Posture:  Posture is normal. normal erect   Strength: right arm 2/5, left side 5/5, right LE hip and right leg flexion 4/5, leg extension and DF 4+/5.      Sensation: left hemiparesis and hemisensory loss   Reflex Exam:   Toes:  Right was upgoing in the past but today toes are equiv bilaterally  Clonus:  Clonus is absent.   Assessment/Plan: 74 year old patient who presents with her husband here for follow up of stroke with residual right-sided hemiparesis and aphasia. Patient has had recent falls and UTI. She ws recently hospitalized after a fall. Repeat CT head showed nothing acute.  She is still weak, she has problems standing and walking. She is going to physical therapy starting Monday. She is battling UTIs. On keppra for seizure. We discussed UTIs in the elderly, also discussed not getting up alone and safety in the home. We reviewed CT of the head without acute events, her hospitalization, discussed her exam is stable today, her deficits likely due to matabolic/infectious causes and disuse and not due to neurologic worsening.Highly recommended following up with therapy and psychiatry for her depression and family therapy. The family appears very earnest in their concern and caring for patient, the  effects of stroke on a family can be devastating. Discussed this at length with patient and husband today and ways to reduce UTIs occurrence. She has decreased appetite, follow with Dr. Warrick Parisian.   - May consider Dietician in Dr Detttinger office appointment, husband wants her to have more appetite and eat more but don't want her to gain weight either. Discuss with Dr. Warrick Parisian.   - I would like patient to continue Keppra. EEG was abnormal, keppra will likely be life long.  - Continue Xarelto and statin for stroke prevention. Follow closely with primary care for management of vascular risk factors.  - Dysarthria and neuro exam: stable - Depression: stabe - elevated MCV check B12   Addendum 07/20/2014: Received extensive records.  [facility] rehabilitation consult notes: Patient was initially admitted to [facility] for electrophysiology study and radiofrequency ablation in February 2015. Unfortunately postoperatively she was noted to be weak on her right side. She was diagnosed with a left embolic MCA distribution CVA. She was also noted to have significant dysphagia and dysarthria. Resultant right-sided hemiparesis and hemisensory loss with dense expressive aphasia, dysarthria and dysphasia. She was admitted to the inpatient rehabilitation unit in 04/26/2013. Notes state that at that time of admission she had complete plegia 0/5 in the right upper extremity and right lower extremity. She was able to follow commands but was mostly nonverbal. Also reported right-sided hemisensory loss.  Per notes, patient appeared lethargic and CT of the head and EEG was performed in rehabilitation. CT on approx 04/29/2013 was compared with CT on 04/16/2013 from [facility], showed an acute left MCA infarction with increased density in the proximal left MCA likely reflecting a thrombus but could be artifactual. No acute intracranial hemorrhage. This finding again likely reflects the  left MCA thrombus and now subacute left MCA infarction. EEG on 04/30/2013 showed mild encephalopathy, left cerebral hemisphere structural and/or physiologic brain damage or dysfunction secondary to recent large middle cerebral artery territory infarction, decreased threshold for seizure of bilateral cerebral hemisphere origin with focalization over the middle aspect of the temporal lobes. Right more than left. No clear evidence of active seizure. Overnight oximetry report was performed on 04/30/2013. The patient was on room air and observed to have slept during testing, recorded over 6 hours 32 minutes, the lowest SaO2 recorded was 93%, negative study for oxygen desaturation.  GI Consult Notes from 05/08/2013 discussed watery diarrhea, C. difficile testing was negative. There is no blood or mucus in the stool. At that time she was on tube feeds, diarrhea started after Glucerna. Diarrhea most likely due to tube feed induced diarrhea.  Patient was again seen on April 1 as requested for the evaluation of persistent diarrhea. 40 notes state patient had at least 3-4 loose bowel movements daily, C. difficile has been negative, no leukocytosis. Negative workup for infectious etiology. Physician discussed the possible etiologies with patient and her husband. A sigmoidoscopy was recommended to look for pseudomembranous colitis, ulcerative colitis, microscopic or collagenous colitis. Patient was put on Levaquin, metronidazole. Sigmoidoscopy showed mild diverticulosis on the left side, patchy erythema with exudate consistent with colitis in the rectosigmoid area biopsied for treatment, sensitive and spastic colon.   Patient was discharged from rehabilitation on 05/23/2013. Per discharge summary: Patient made fair progression throughout her rehabilitation hospitalization. At that time patient had passed a swallow study and was put on oral diet. Continued speech therapy and SLP was recommended. Discharge  summary states that neurology started patient on Ritalin for decreased attention. Discharge summary states that she was diagnosed with colitis and started on antibiotics. She did undergo a sigmoidoscopy the day prior to her discharge. Paxil was increased several days prior to her discharge due to depression. Patient was transferred from inpatient rehabilitation to skilled nursing.    EEG performed 08/01/2013 patient was sent from her subacute rehabilitation facility. Impression was abnormal EEG with finding indicating left cerebral hemisphere structural and/or physiologic brain damage or dysfunction. Epileptiform activity with epileptogenic potential over the left cerebral hemisphere (phase reversal at T3) with possibility of localization over the left mid temporal lobes.  Notes from neurology and sleep clinic of [place]: Notes state that this physician first saw patient in rehabilitation on 04/29/2013. Patient was lethargic and she had an abnormal EEG on 04/30/2013. At that time physician started on Keppra and performed an overnight pulse ox (see above for results). No clinical seizures were recorded. She was started on Keppra 250 mg 3 times a day. Repeat EEG was performed on 08/01/2013 (see above for results) EEG on 10/21/2013 was abnormal awake and asleep EEG showing brief electrographic subclinical seizures and interictal discharges arising from the left frontal temporal lobes. EEG 03/10/2014 showed minimal focal epileptiform activity arising from the anterior aspect of the left frontal temporal lobe with no electrographic subclinical seizures. Physician attributed her lethargy and cognitive mentation changes as attributed to seizure activity. Keppra was increased to 500 in the morning and 750 in the evenings in October 2015.  Still pending notes from [place]: reviewed 561 pages of records. Significant details as relating to her neurologic management  was noted below:  Encounter date 02/28/2013: She had 2 catheter ablation procedures(2013 and 2014) for afib on xarelto and, s/p pacemaker, antiarrhythmic medications. Still with continued afib, fatigue and palpitations. Hx of impaired glucose control, HTN, DM,chronic back pain, bronchospasm, hypothyroidism, IBS, peripheral neuropathy, anxiety, and psoriasis. Discussed combined endocardial and epicardial ablation procedure.   03/24/2013: left heart cath, selective coronary angiography, LV angiography results show normal coronary arteries with nml left ventricular systolic function.  03/03/2013: PMHx paroxysmal afib, sinus node dysfunction and tachybradycardia syndrome s/p pacemaker in 2010. They discussed again combined endocardial and epicardial ablation procedure or as a last resort AV node ablation.  Echo 03/2013: no intracardiac thrombus, nml left ventricular EF, nml right ventricular contractile performance.   Carotid duplex 04/14/2013: Right carotid: Evidence in the ICA of a less than 40% stenocis Left carotid: evidence in the ICA of a less than 40% stenosis. Non-hemodynamically significant plaque noted in the cca. Vertebrals: patent with ategrade flow Subclavians: normal flow bilaterally  2/24/105: procedure for transabdominal laporoscopic epicardial and endocardial radiofrequency ablation of the left atrium. Notes state successful hybrid ablation procedure for paroxysmal afib. Notes state that pt begain to wake up from anesthesia and not moving right arm. Right arm no response to pain. Patient non-verbal but responding by nodding, all other extremities moving appropriately. She did start to grimace to pain on the right arm. Also noted to have right facial droop. Neurology was consulted.  Neurology note: noted right facial droop, patient following some simple commands, speech with aphasia, no movement in right arm and leg. NIHSS 25. She was not a tPA candidate. CT head stat, could not  have MRI due to pacemaker. Large L MCA stroke, increased density in the proximal left MCA likely thrombus with resultant right-sided weakness and expressive aphasia with a left gaze preference.  ldl 21, hgba1c 6.2,   Neurology note 2/28: neurologically stable for 3 days and transferred back to the [facility] from the [facility]. Neurologic exam was limited due to hypersomnolence. Slight gaze preference, can cross the midline, right facial droop, decreased tone in the right arm with pronator drift, not moving r arm or leg even to noxious stim. Left side moving spontaneously, upgoing r toe.    EGD with peg tube 04/23/2013.Marland Kitchen  She was discharged to inpatient rehab(see details above) but did not regain any movement to her right side prior to discharge     Sarina Ill, MD  [facility] [address] [place], [place] [address]  Phone [phone] Fax [phone]  I spent over 40 minutes of face-to-face and non-face-to-face time with patient on the  1. History of stroke   2. Elevated MCV   3. B12 deficiency   4. Seizure, late effect of stroke ([place])   5. Paroxysmal atrial fibrillation ([place])   6. Fall, initial encounter   7. Urinary tract infection without hematuria, site unspecified    diagnosis.  This included previsit chart review, lab review, study review, order entry, electronic health record documentation, patient education on the different diagnostic and therapeutic options, counseling and coordination of care, risks and benefits of management, compliance, or risk factor reduction

## 2019-10-03 ENCOUNTER — Encounter: Payer: Self-pay | Admitting: Family Medicine

## 2019-10-03 ENCOUNTER — Ambulatory Visit (INDEPENDENT_AMBULATORY_CARE_PROVIDER_SITE_OTHER): Payer: Medicare Other | Admitting: Family Medicine

## 2019-10-03 ENCOUNTER — Ambulatory Visit: Payer: Medicare Other | Admitting: Urology

## 2019-10-03 DIAGNOSIS — S7001XD Contusion of right hip, subsequent encounter: Secondary | ICD-10-CM

## 2019-10-03 DIAGNOSIS — R630 Anorexia: Secondary | ICD-10-CM

## 2019-10-03 MED ORDER — DICLOFENAC SODIUM 1 % EX GEL
2.0000 g | Freq: Four times a day (QID) | CUTANEOUS | 3 refills | Status: AC
Start: 2019-10-03 — End: ?

## 2019-10-03 MED ORDER — MIRTAZAPINE 30 MG PO TABS: 30.0000 mg | ORAL_TABLET | Freq: Every day | ORAL | 3 refills | Status: DC

## 2019-10-03 NOTE — Progress Notes (Signed)
Virtual Visit via telephone Note  I connected with [name] on 10/03/19 at 1455 by telephone and verified that I am speaking with the correct person using two identifiers. [name] is currently located at home and husband are currently with her during visit. The provider, Fransisca Kaufmann Fairley Copher, MD is located in their office at time of visit.  Call ended at 1508  I discussed the limitations, risks, security and privacy concerns of performing an evaluation and management service by telephone and the availability of in person appointments. I also discussed with the patient that there may be a patient responsible charge related to this service. The patient expressed understanding and agreed to proceed.   History and Present Illness: Patient is calling in for hospital follow up for a fall and large hematoma on her hip and is weak at night.  She is still having pain and discomfort and she was also treated for UTI. She had overnight oxygen and was 93 % at the lowest. She is not able to communicate because of the stroke in the past. She was dehydrated and had a UTI. She is using tylenol and has pain on right hip and right shoulder.   1. Poor appetite   2. Hematoma of right hip, subsequent encounter     Outpatient Encounter Medications as of 10/03/2019  Medication Sig  . acetaminophen (TYLENOL) 500 MG tablet Take 500 mg by mouth as needed.  . Ascorbic Acid (VITAMIN C PO) Take 1 tablet by mouth daily.  . Calcium Carbonate-Vitamin D (CALCIUM-VITAMIN D) 500-200 MG-UNIT per tablet Take 1 tablet by mouth daily.  . cholecalciferol (VITAMIN D) 1000 UNITS tablet Take 2,000 Units by mouth daily.   . citalopram (CELEXA) 20 MG tablet Take 1 tablet (20 mg total) by mouth 2 (two) times daily.  . cyclobenzaprine (FLEXERIL) 5 MG tablet Take 1 tablet (5 mg total) by mouth 3 (three) times daily as needed for muscle spasms.  . diclofenac Sodium (VOLTAREN) 1 % GEL Apply 2 g topically 4 (four) times daily.  Marland Kitchen  dicyclomine (BENTYL) 10 MG capsule TAKE 1 CAPSULE BY MOUTH FOUR TIMES A DAY BEFORE MEALS AND AT BEDTIME  . Dulaglutide (TRULICITY) 9.93 ZJ/6.9CV SOPN Inject 0.5 mLs (0.75 mg total) into the skin daily as needed (For blood sugar).  . fluticasone (FLONASE) 50 MCG/ACT nasal spray INSTILL 2 SPRAYS INTO EACH NOSTRIL EVERY DAY (Patient taking differently: Place 2 sprays into both nostrils daily. )  . folic acid (FOLVITE) 1 MG tablet Take 2 tablets (2 mg total) by mouth daily.  . furosemide (LASIX) 20 MG tablet Take 0.5 tablets (10 mg total) by mouth daily as needed for fluid.  Marland Kitchen glucose blood (ONETOUCH ULTRA) test strip Test BS daily Dx E11.9  . KLOR-CON M20 20 MEQ tablet TAKE 2 TABLETS BY MOUTH 2 TIMES A DAY (Patient taking differently: Take 20 mEq by mouth in the morning and at bedtime. )  . levETIRAcetam (KEPPRA) 500 MG tablet TAKE 1 TABLET BY MOUTH TWICE A DAY (Patient taking differently: Take 500 mg by mouth 2 (two) times daily. )  . levothyroxine (SYNTHROID) 88 MCG tablet Take 1 tablet (88 mcg total) by mouth daily before breakfast.  . loperamide (IMODIUM) 2 MG capsule Take 4 mg by mouth daily as needed for diarrhea or loose stools (Take as directed). Reported on 05/17/2015  . metFORMIN (GLUCOPHAGE-XR) 500 MG 24 hr tablet TAKE 2 TABLETS (1,000MG ) BY MOUTH TWICE A DAY AFTER A MEAL (Patient taking differently:  Take 1,000 mg by mouth in the morning and at bedtime. )  . methotrexate (RHEUMATREX) 2.5 MG tablet Take 4 tablets (10 mg total) by mouth once a week. Caution:Chemotherapy. Protect from light.  . metoprolol tartrate (LOPRESSOR) 50 MG tablet Take 1 tablet (50 mg total) by mouth 2 (two) times daily.  . mirtazapine (REMERON) 30 MG tablet Take 1 tablet (30 mg total) by mouth at bedtime.  . Multiple Vitamins-Minerals (EYE VITAMINS PO) Take 2 tablets by mouth daily. AREDS formula  . NYSTATIN powder APPLY TWICE DAILY UNDERNEATH BREASTS  . omeprazole (PRILOSEC) 40 MG capsule Take 1 capsule (40 mg total) by  mouth daily.  . Probiotic Product (PROBIOTIC PO) Take 1 capsule by mouth daily.  . rivaroxaban (XARELTO) 20 MG TABS tablet Take 1 tablet (20 mg total) by mouth daily.  . simvastatin (ZOCOR) 40 MG tablet TAKE 1 TABLET BY MOUTH EVERY DAY  . [DISCONTINUED] mirtazapine (REMERON) 15 MG tablet Take 1 tablet (15 mg total) by mouth at bedtime. (Patient taking differently: Take 30 mg by mouth at bedtime. )   No facility-administered encounter medications on file as of 10/03/2019.    Review of Systems  Constitutional: Positive for appetite change. Negative for chills and fever.  Eyes: Negative for visual disturbance.  Respiratory: Negative for chest tightness and shortness of breath.   Cardiovascular: Negative for chest pain and leg swelling.  Gastrointestinal: Negative for abdominal pain.  Genitourinary: Negative for decreased urine volume, difficulty urinating, dysuria and urgency.  Musculoskeletal: Negative for back pain and gait problem.  Skin: Negative for rash.  Neurological: Negative for dizziness, light-headedness and headaches.  Psychiatric/Behavioral: Negative for agitation, behavioral problems, self-injury, sleep disturbance and suicidal ideas.  All other systems reviewed and are negative.   Observations/Objective: Patient sounds comfortable and in no acute distress  Assessment and Plan: Problem List Items Addressed This Visit      Other   Hip hematoma, right    Other Visit Diagnoses    Poor appetite    -  Primary   Relevant Medications   mirtazapine (REMERON) 30 MG tablet    She was seen in the emergency department and was found to have a UTI the likely cause her fall and issues.  She sustained a hematoma and bruise her right shoulder, her husband says she is still in some pain so sent some Voltaren gel, she is taking Tylenol as well.  Follow up plan: Return if symptoms worsen or fail to improve.  Follow-up at usual appointment time   I discussed the assessment and  treatment plan with the patient. The patient was provided an opportunity to ask questions and all were answered. The patient agreed with the plan and demonstrated an understanding of the instructions.   The patient was advised to call back or seek an in-person evaluation if the symptoms worsen or if the condition fails to improve as anticipated.  The above assessment and management plan was discussed with the patient. The patient verbalized understanding of and has agreed to the management plan. Patient is aware to call the clinic if symptoms persist or worsen. Patient is aware when to return to the clinic for a follow-up visit. Patient educated on when it is appropriate to go to the emergency department.    I provided 13 minutes of non-face-to-face time during this encounter.    Worthy Rancher, MD

## 2019-10-05 ENCOUNTER — Encounter: Payer: Self-pay | Admitting: Neurology

## 2019-10-05 NOTE — Patient Instructions (Signed)
Follow up with Dr. Warrick Parisian tomorrow and with dr Jaynee Eagles in 6 months

## 2019-10-06 LAB — METHYLMALONIC ACID, SERUM: Methylmalonic Acid: 104 nmol/L (ref 0–378)

## 2019-10-06 LAB — B12 AND FOLATE PANEL
Folate: >20 ng/mL (ref >3.0)
Vitamin B-12: 1145 pg/mL (ref 232–1245)

## 2019-10-24 ENCOUNTER — Other Ambulatory Visit: Payer: Self-pay | Admitting: Family Medicine

## 2019-10-29 ENCOUNTER — Other Ambulatory Visit: Payer: Self-pay

## 2019-10-29 ENCOUNTER — Ambulatory Visit (INDEPENDENT_AMBULATORY_CARE_PROVIDER_SITE_OTHER): Payer: Medicare Other

## 2019-10-29 DIAGNOSIS — E1122 Type 2 diabetes mellitus with diabetic chronic kidney disease: Secondary | ICD-10-CM | POA: Diagnosis not present

## 2019-10-29 DIAGNOSIS — E875 Hyperkalemia: Secondary | ICD-10-CM

## 2019-10-29 DIAGNOSIS — I480 Paroxysmal atrial fibrillation: Secondary | ICD-10-CM

## 2019-10-29 DIAGNOSIS — G40909 Epilepsy, unspecified, not intractable, without status epilepticus: Secondary | ICD-10-CM

## 2019-10-29 DIAGNOSIS — I2510 Atherosclerotic heart disease of native coronary artery without angina pectoris: Secondary | ICD-10-CM | POA: Diagnosis not present

## 2019-10-29 DIAGNOSIS — Z7984 Long term (current) use of oral hypoglycemic drugs: Secondary | ICD-10-CM

## 2019-10-29 DIAGNOSIS — N189 Chronic kidney disease, unspecified: Secondary | ICD-10-CM

## 2019-10-29 DIAGNOSIS — K592 Neurogenic bowel, not elsewhere classified: Secondary | ICD-10-CM

## 2019-10-29 DIAGNOSIS — I495 Sick sinus syndrome: Secondary | ICD-10-CM

## 2019-10-29 DIAGNOSIS — I129 Hypertensive chronic kidney disease with stage 1 through stage 4 chronic kidney disease, or unspecified chronic kidney disease: Secondary | ICD-10-CM | POA: Diagnosis not present

## 2019-10-29 DIAGNOSIS — G8929 Other chronic pain: Secondary | ICD-10-CM

## 2019-10-29 DIAGNOSIS — F418 Other specified anxiety disorders: Secondary | ICD-10-CM

## 2019-10-29 DIAGNOSIS — N319 Neuromuscular dysfunction of bladder, unspecified: Secondary | ICD-10-CM

## 2019-10-29 DIAGNOSIS — F329 Major depressive disorder, single episode, unspecified: Secondary | ICD-10-CM

## 2019-10-29 DIAGNOSIS — M549 Dorsalgia, unspecified: Secondary | ICD-10-CM

## 2019-10-29 DIAGNOSIS — E039 Hypothyroidism, unspecified: Secondary | ICD-10-CM

## 2019-10-29 DIAGNOSIS — E1142 Type 2 diabetes mellitus with diabetic polyneuropathy: Secondary | ICD-10-CM

## 2019-10-29 DIAGNOSIS — D62 Acute posthemorrhagic anemia: Secondary | ICD-10-CM

## 2019-10-29 DIAGNOSIS — Z950 Presence of cardiac pacemaker: Secondary | ICD-10-CM

## 2019-10-29 DIAGNOSIS — I700 Atherosclerosis of aorta: Secondary | ICD-10-CM

## 2019-10-29 DIAGNOSIS — I69320 Aphasia following cerebral infarction: Secondary | ICD-10-CM

## 2019-10-29 DIAGNOSIS — K219 Gastro-esophageal reflux disease without esophagitis: Secondary | ICD-10-CM

## 2019-10-29 DIAGNOSIS — K589 Irritable bowel syndrome without diarrhea: Secondary | ICD-10-CM

## 2019-10-29 DIAGNOSIS — I69361 Other paralytic syndrome following cerebral infarction affecting right dominant side: Secondary | ICD-10-CM

## 2019-10-29 DIAGNOSIS — S7001XD Contusion of right hip, subsequent encounter: Secondary | ICD-10-CM

## 2019-10-29 DIAGNOSIS — Z79899 Other long term (current) drug therapy: Secondary | ICD-10-CM

## 2019-10-29 DIAGNOSIS — K766 Portal hypertension: Secondary | ICD-10-CM

## 2019-11-03 ENCOUNTER — Telehealth: Payer: Self-pay | Admitting: Family Medicine

## 2019-11-03 DIAGNOSIS — R1115 Cyclical vomiting syndrome unrelated to migraine: Secondary | ICD-10-CM

## 2019-11-04 NOTE — Telephone Encounter (Signed)
Placed referral for the patient, placed urgently.

## 2019-11-06 ENCOUNTER — Encounter: Payer: Self-pay | Admitting: Physician Assistant

## 2019-11-10 ENCOUNTER — Other Ambulatory Visit: Payer: Self-pay

## 2019-11-10 ENCOUNTER — Ambulatory Visit (INDEPENDENT_AMBULATORY_CARE_PROVIDER_SITE_OTHER): Payer: Medicare Other | Admitting: Family Medicine

## 2019-11-10 ENCOUNTER — Encounter: Payer: Self-pay | Admitting: Family Medicine

## 2019-11-10 DIAGNOSIS — I10 Essential (primary) hypertension: Secondary | ICD-10-CM | POA: Diagnosis not present

## 2019-11-10 DIAGNOSIS — E1159 Type 2 diabetes mellitus with other circulatory complications: Secondary | ICD-10-CM

## 2019-11-10 DIAGNOSIS — E034 Atrophy of thyroid (acquired): Secondary | ICD-10-CM

## 2019-11-10 DIAGNOSIS — N390 Urinary tract infection, site not specified: Secondary | ICD-10-CM | POA: Diagnosis not present

## 2019-11-10 MED ORDER — SIMVASTATIN 40 MG PO TABS: 40.0000 mg | ORAL_TABLET | Freq: Every day | ORAL | 3 refills | Status: AC

## 2019-11-10 MED ORDER — METFORMIN HCL ER 500 MG PO TB24: 1000.0000 mg | ORAL_TABLET | Freq: Two times a day (BID) | ORAL | 3 refills | Status: AC

## 2019-11-10 NOTE — Progress Notes (Signed)
Virtual Visit via telephone Note  I connected with [name] on 11/10/19 at 1514 by telephone and verified that I am speaking with the correct person using two identifiers. [name] is currently located at home and husband are currently with her during visit. The provider, Fransisca Kaufmann Gianny Sabino, MD is located in their office at time of visit.  Call ended at 1528  I discussed the limitations, risks, security and privacy concerns of performing an evaluation and management service by telephone and the availability of in person appointments. I also discussed with the patient that there may be a patient responsible charge related to this service. The patient expressed understanding and agreed to proceed.   History and Present Illness: Type 2 diabetes mellitus Patient comes in today for recheck of his diabetes. Patient has been currently taking trulicity and metformin. Patient is not currently on an ACE inhibitor/ARB. Patient has not seen an ophthalmologist this year. Patient denies any issues with their feet. The symptom started onset as an adult htn and hypothyroid ARE RELATED TO DM. Her blood sugar was 99 this am  Hypertension Patient is currently on metoprolol, and their blood pressure today is unknown. Patient denies any lightheadedness or dizziness. Patient denies headaches, blurred vision, chest pains, shortness of breath, or weakness. Denies any side effects from medication and is content with current medication.   Hypothyroidism recheck Patient is coming in for thyroid recheck today as well. They deny any issues with hair changes or heat or cold problems or diarrhea or constipation. They deny any chest pain or palpitations. They are currently on levothyroxine 93mcrograms   Patient is having some difficulty swallowing again and may need esophagus stretched again and she has appt to go down again to GI  No diagnosis found.  Outpatient Encounter Medications as of 11/10/2019    Medication Sig  . acetaminophen (TYLENOL) 500 MG tablet Take 500 mg by mouth as needed.  . Ascorbic Acid (VITAMIN C PO) Take 1 tablet by mouth daily.  . Calcium Carbonate-Vitamin D (CALCIUM-VITAMIN D) 500-200 MG-UNIT per tablet Take 1 tablet by mouth daily.  . cholecalciferol (VITAMIN D) 1000 UNITS tablet Take 2,000 Units by mouth daily.   . citalopram (CELEXA) 20 MG tablet Take 1 tablet (20 mg total) by mouth 2 (two) times daily.  . cyclobenzaprine (FLEXERIL) 5 MG tablet Take 1 tablet (5 mg total) by mouth 3 (three) times daily as needed for muscle spasms.  . diclofenac Sodium (VOLTAREN) 1 % GEL Apply 2 g topically 4 (four) times daily.  .Marland Kitchendicyclomine (BENTYL) 10 MG capsule TAKE 1 CAPSULE BY MOUTH FOUR TIMES A DAY BEFORE MEALS AND AT BEDTIME  . Dulaglutide (TRULICITY) 06.38MGT/3.6IWSOPN Inject 0.5 mLs (0.75 mg total) into the skin daily as needed (For blood sugar).  . fluticasone (FLONASE) 50 MCG/ACT nasal spray INSTILL 2 SPRAYS INTO EACH NOSTRIL EVERY DAY (Patient taking differently: Place 2 sprays into both nostrils daily. )  . folic acid (FOLVITE) 1 MG tablet Take 2 tablets (2 mg total) by mouth daily.  . furosemide (LASIX) 20 MG tablet Take 0.5 tablets (10 mg total) by mouth daily as needed for fluid.  .Marland Kitchenglucose blood (ONETOUCH ULTRA) test strip Test BS daily Dx E11.9  . KLOR-CON M20 20 MEQ tablet TAKE 2 TABLETS BY MOUTH 2 TIMES A DAY (Patient taking differently: Take 20 mEq by mouth in the morning and at bedtime. )  . levETIRAcetam (KEPPRA) 500 MG tablet TAKE 1 TABLET BY MOUTH  TWICE A DAY (Patient taking differently: Take 500 mg by mouth 2 (two) times daily. )  . levothyroxine (SYNTHROID) 88 MCG tablet Take 1 tablet (88 mcg total) by mouth daily before breakfast.  . loperamide (IMODIUM) 2 MG capsule Take 4 mg by mouth daily as needed for diarrhea or loose stools (Take as directed). Reported on 05/17/2015  . metFORMIN (GLUCOPHAGE-XR) 500 MG 24 hr tablet TAKE 2 TABLETS (1,000MG) BY MOUTH TWICE  A DAY AFTER A MEAL  . methotrexate (RHEUMATREX) 2.5 MG tablet Take 4 tablets (10 mg total) by mouth once a week. Caution:Chemotherapy. Protect from light.  . metoprolol tartrate (LOPRESSOR) 50 MG tablet Take 1 tablet (50 mg total) by mouth 2 (two) times daily.  . mirtazapine (REMERON) 30 MG tablet Take 1 tablet (30 mg total) by mouth at bedtime.  . Multiple Vitamins-Minerals (EYE VITAMINS PO) Take 2 tablets by mouth daily. AREDS formula  . NYSTATIN powder APPLY TWICE DAILY UNDERNEATH BREASTS  . omeprazole (PRILOSEC) 40 MG capsule Take 1 capsule (40 mg total) by mouth daily.  . Probiotic Product (PROBIOTIC PO) Take 1 capsule by mouth daily.  . rivaroxaban (XARELTO) 20 MG TABS tablet Take 1 tablet (20 mg total) by mouth daily.  . simvastatin (ZOCOR) 40 MG tablet TAKE 1 TABLET BY MOUTH EVERY DAY   No facility-administered encounter medications on file as of 11/10/2019.    Review of Systems  Constitutional: Negative for chills and fever.  HENT: Positive for trouble swallowing.   Eyes: Negative for visual disturbance.  Respiratory: Negative for chest tightness and shortness of breath.   Cardiovascular: Negative for chest pain and leg swelling.  Musculoskeletal: Negative for back pain and gait problem.  Skin: Negative for rash.  Neurological: Negative for light-headedness and headaches.  Psychiatric/Behavioral: Positive for confusion. Negative for agitation and behavioral problems.  All other systems reviewed and are negative.   Observations/Objective: Patient sounds comfortable and in no acute distress  Assessment and Plan: Problem List Items Addressed This Visit      Cardiovascular and Mediastinum   HTN (hypertension)   Relevant Medications   simvastatin (ZOCOR) 40 MG tablet   Other Relevant Orders   CMP14+EGFR     Endocrine   Hypothyroidism   Relevant Orders   TSH   Type 2 diabetes mellitus ([place]) - Primary   Relevant Medications   simvastatin (ZOCOR) 40 MG tablet    metFORMIN (GLUCOPHAGE-XR) 500 MG 24 hr tablet   Other Relevant Orders   CBC with Differential/Platelet   CMP14+EGFR   Lipid panel    Other Visit Diagnoses    Recurrent UTI       Relevant Medications   nitrofurantoin (MACRODANTIN) 50 MG capsule   Other Relevant Orders   Ambulatory referral to Urology      Continue current medication. Follow up plan: Return in about 3 months (around 02/09/2020), or if symptoms worsen or fail to improve, for diabetes and thyroid.     I discussed the assessment and treatment plan with the patient. The patient was provided an opportunity to ask questions and all were answered. The patient agreed with the plan and demonstrated an understanding of the instructions.   The patient was advised to call back or seek an in-person evaluation if the symptoms worsen or if the condition fails to improve as anticipated.  The above assessment and management plan was discussed with the patient. The patient verbalized understanding of and has agreed to the management plan. Patient is aware to call the clinic  if symptoms persist or worsen. Patient is aware when to return to the clinic for a follow-up visit. Patient educated on when it is appropriate to go to the emergency department.    I provided 14 minutes of non-face-to-face time during this encounter.    Worthy Rancher, MD

## 2019-11-12 ENCOUNTER — Telehealth: Payer: Self-pay | Admitting: Family Medicine

## 2019-11-12 NOTE — Telephone Encounter (Signed)
Go ahead and send orders today of the same orders that I have in the computer for CBC chemistry panel lipid and thyroid

## 2019-11-12 NOTE — Telephone Encounter (Signed)
Lab orders for TSH, Lipid, CMP & CBC given to [place] office

## 2019-11-25 ENCOUNTER — Telehealth: Payer: Self-pay | Admitting: *Deleted

## 2019-11-25 NOTE — Telephone Encounter (Signed)
VM from [place] w/ [facility] To call Husband regarding antibiotic pt was taking causing her diarrhea,  Recommended getting Pepto should she take with Pepto, or continue taking  LMOVM for husband to call back to find out what antibiotic patient was on and who prescribed it

## 2019-12-02 ENCOUNTER — Encounter: Payer: Self-pay | Admitting: Family Medicine

## 2019-12-10 ENCOUNTER — Encounter: Payer: Self-pay | Admitting: *Deleted

## 2019-12-11 ENCOUNTER — Encounter: Payer: Self-pay | Admitting: Physician Assistant

## 2019-12-11 ENCOUNTER — Ambulatory Visit (INDEPENDENT_AMBULATORY_CARE_PROVIDER_SITE_OTHER): Payer: Medicare Other | Admitting: Physician Assistant

## 2019-12-11 ENCOUNTER — Ambulatory Visit: Payer: Medicare Other | Admitting: Physician Assistant

## 2019-12-11 VITALS — BP 116/66 | HR 88

## 2019-12-11 DIAGNOSIS — R630 Anorexia: Secondary | ICD-10-CM | POA: Diagnosis not present

## 2019-12-11 DIAGNOSIS — R634 Abnormal weight loss: Secondary | ICD-10-CM | POA: Diagnosis not present

## 2019-12-11 DIAGNOSIS — R1310 Dysphagia, unspecified: Secondary | ICD-10-CM

## 2019-12-11 MED ORDER — OMEPRAZOLE 40 MG PO CPDR: 40.0000 mg | DELAYED_RELEASE_CAPSULE | Freq: Two times a day (BID) | ORAL | 5 refills | Status: AC

## 2019-12-11 NOTE — Patient Instructions (Addendum)
If you are age 72 or older, your body mass index should be between 23-30. Your There is no height or weight on file to calculate BMI. If this is out of the aforementioned range listed, please consider follow up with your Primary Care Provider.  If you are age 38 or younger, your body mass index should be between 19-25. Your There is no height or weight on file to calculate BMI. If this is out of the aformentioned range listed, please consider follow up with your Primary Care Provider.   Increase Omeprazole 40 mg Twice a day    You have been scheduled for a Barium Esophogram at [facility] (1st floor of the hospital) on 12/29/19 at 11:00 am. Please arrive 15 minutes prior to your appointment for registration. Make certain not to have anything to eat or drink 3 hours prior to your test. If you need to reschedule for any reason, please contact radiology at [phone] to do so. __________________________________________________________________ A barium swallow is an examination that concentrates on views of the esophagus. This tends to be a double contrast exam (barium and two liquids which, when combined, create a gas to distend the wall of the oesophagus) or single contrast (non-ionic iodine based). The study is usually tailored to your symptoms so a good history is essential. Attention is paid during the study to the form, structure and configuration of the esophagus, looking for functional disorders (such as aspiration, dysphagia, achalasia, motility and reflux) EXAMINATION You may be asked to change into a gown, depending on the type of swallow being performed. A radiologist and radiographer will perform the procedure. The radiologist will advise you of the type of contrast selected for your procedure and direct you during the exam. You will be asked to stand, sit or lie in several different positions and to hold a small amount of fluid in your mouth before being asked to swallow while the  imaging is performed .In some instances you may be asked to swallow barium coated marshmallows to assess the motility of a solid food bolus. The exam can be recorded as a digital or video fluoroscopy procedure. POST PROCEDURE It will take 1-2 days for the barium to pass through your system. To facilitate this, it is important, unless otherwise directed, to increase your fluids for the next 24-48hrs and to resume your normal diet.  This test typically takes about 30 minutes to perform. __________________________________________________________________________________

## 2019-12-11 NOTE — Progress Notes (Signed)
Attending Physician's Attestation   I have reviewed the chart.   I agree with the Advanced Practitioner's note, impression, and recommendations with any updates as below. Happy to evaluate the patient further in clinic, however based on the patient's more recent laboratories from August with significant decline in blood counts, it is reasonable for [place] to see how labs look to also help me moving forward when I see her and determine whether more urgent procedure or more urgent clinic visit will need to be performed. RN Gerarda Fraction, please look at the labs that were ordered by her primary care provider and I agree with those being done or if those have been done that we get the results so that we can know in case she needs to be seen sooner for clinic or for procedures. Thank you all.   Justice Britain, MD [facility] Office # [phone]

## 2019-12-11 NOTE — Progress Notes (Signed)
Chief Complaint: Dysphagia, weight loss, decrease in appetite  HPI:    [name] is a 74 year old female with a past medical history as listed below, previously known to Dr. Sharlett Iles, who was referred to me by Dettinger, Fransisca Kaufmann, MD for a complaint of dysphagia, weight loss and a decrease in appetite.      08/11/2011 EGD with moderate gastritis, hiatal hernia and otherwise normal.  Colonoscopy on the same day with diverticulosis and otherwise normal.  Repeat recommended in 10 years.    09/22/2019 CBC with a hemoglobin low at 8.6.  BMP with elevated glucose at 108.    Today, the patient presents to clinic accompanied by her husband to to speak for her as she is status post stroke and has difficulty verbalizing.  History is somewhat difficult.  He explains that she has a history of a stricture which has not been dilated in "years".  He tells me after talking with patient's home health nurse it was suggested that possibly this is an issue for her again and she seems to complain of pills getting stuck in her throat and regurgitation/vomiting of these and sometimes her food.  This is concerning to her husband because he does not want her to aspirate and have trouble breathing.  She is currently on Omeprazole 40 mg daily.  This occurs maybe once or twice a week.  Associated symptoms include a decrease in appetite and some associated weight loss though "I have not been weighing her", so he cannot tell me an exact number.  Apparently though over the past 4 to 5 months she has had a total loss of appetite and is just not interested in food.    Also briefly discusses an episode of diarrhea that she had with some incontinence, but apparently this has not been an issue over the past 3 to 4 weeks and stools are more solid.    Also spoke with patient's daughter on the phone who reports that they are trying to get her umbilical hernia fixed but she had an "episode", and they were told by the surgeons to wait 6 months  after this episode, so they are awaiting cardiac clearance etc. before they will discuss repair.    Denies fever, chills, blood in her stool or symptoms that awaken her from sleep.  Past Medical History:  Diagnosis Date  . Anemia   . Bronchial spasms   . Chronic anxiety   . Chronic kidney disease   . Colitis 2015   [place], [place]  . Complication of anesthesia    " I shake real bad "  . Diverticulosis   . Elevated blood sugar   . Esophageal stricture   . Family history of anesthesia complication    Daughter also shakes while waking up  . Gastritis   . GERD (gastroesophageal reflux disease)   . H/O hiatal hernia   . H/O scarlet fever   . Hiatal hernia   . History of bladder repair surgery   . Hx of vaginal hysterectomy   . Hypertension   . Hypothyroidism   . IBS (irritable bowel syndrome)    vs diarrhea vs abd. fullness   . Impaired glucose tolerance   . Neuromuscular disorder ([place])    periferal neuropathy  . Pacemaker    St. Jude  . Paraumbilical hernia   . Paroxysmal atrial fibrillation (HCC)    a. failed flecainide, tikosyn, amio;  b. 01/2012 s/p RFCA.  Marland Kitchen Portal hypertension ([place]) 2021   ct  .  Psoriasis   . Rectocele, female   . Rheumatoid arthritis ([place])   . Seizures ([place])   . Stroke ([place])   . Tachy-brady syndrome ([place])    a. 08/19/2008 s/p PPM: SJM 2110 [employer]  . Uterine prolapse   . Vaginal prolapse 1998    Past Surgical History:  Procedure Laterality Date  . ATRIAL FIBRILLATION ABLATION  02/06/12   PVI by Dr Rayann Heman  . ATRIAL FIBRILLATION ABLATION  07/04/2012   repeat PVI by Dr Rayann Heman  . ATRIAL FIBRILLATION ABLATION N/A 02/06/2012   Procedure: ATRIAL FIBRILLATION ABLATION;  Surgeon: Thompson Grayer, MD;  Location: [facility] CATH LAB;  Service: Cardiovascular;  Laterality: N/A;  . ATRIAL FIBRILLATION ABLATION N/A 07/04/2012   Procedure: ATRIAL FIBRILLATION ABLATION;  Surgeon: Thompson Grayer, MD;  Location: [facility] CATH LAB;  Service: Cardiovascular;  Laterality: N/A;  .  BLADDER SUSPENSION    . INSERT / REPLACE / REMOVE PACEMAKER     SJM  . LEFT HEART CATHETERIZATION WITH CORONARY ANGIOGRAM N/A 03/24/2013   Procedure: LEFT HEART CATHETERIZATION WITH CORONARY ANGIOGRAM;  Surgeon: Blane Ohara, MD;  Location: [facility] CATH LAB;  Service: Cardiovascular;  Laterality: N/A;  . RECTOCELE REPAIR    . TEE WITHOUT CARDIOVERSION  02/06/2012   Procedure: TRANSESOPHAGEAL ECHOCARDIOGRAM (TEE);  Surgeon: Thayer Headings, MD;  Location: [place];  Service: Cardiovascular;  Laterality: N/A;  . TEE WITHOUT CARDIOVERSION N/A 07/04/2012   Procedure: TRANSESOPHAGEAL ECHOCARDIOGRAM (TEE);  Surgeon: Lelon Perla, MD;  Location[facility]COPY;  Service: Cardiovascular;  Laterality: N/A;  . TRANSTHORACIC ECHOCARDIOGRAM  2008  . VAGINAL HYSTERECTOMY     prolapse     Current Outpatient Medications  Medication Sig Dispense Refill  . acetaminophen (TYLENOL) 500 MG tablet Take 500 mg by mouth as needed.    . Ascorbic Acid (VITAMIN C PO) Take 1 tablet by mouth daily.    . Calcium Carbonate-Vitamin D (CALCIUM-VITAMIN D) 500-200 MG-UNIT per tablet Take 1 tablet by mouth daily.    . cholecalciferol (VITAMIN D) 1000 UNITS tablet Take 2,000 Units by mouth daily.     . citalopram (CELEXA) 20 MG tablet Take 1 tablet (20 mg total) by mouth 2 (two) times daily. 180 tablet 3  . cyclobenzaprine (FLEXERIL) 5 MG tablet Take 1 tablet (5 mg total) by mouth 3 (three) times daily as needed for muscle spasms. 30 tablet 0  . diclofenac Sodium (VOLTAREN) 1 % GEL Apply 2 g topically 4 (four) times daily. 350 g 3  . dicyclomine (BENTYL) 10 MG capsule TAKE 1 CAPSULE BY MOUTH FOUR TIMES A DAY BEFORE MEALS AND AT BEDTIME 360 capsule 3  . fluticasone (FLONASE) 50 MCG/ACT nasal spray INSTILL 2 SPRAYS INTO EACH NOSTRIL EVERY DAY (Patient taking differently: Place 2 sprays into both nostrils daily. ) 16 mL 11  . folic acid (FOLVITE) 1 MG tablet Take 2 tablets (2 mg total) by mouth daily.    . furosemide (LASIX)  20 MG tablet Take 0.5 tablets (10 mg total) by mouth daily as needed for fluid.    Marland Kitchen glucose blood (ONETOUCH ULTRA) test strip Test BS daily Dx E11.9 100 strip 3  . KLOR-CON M20 20 MEQ tablet TAKE 2 TABLETS BY MOUTH 2 TIMES A DAY (Patient taking differently: Take 20 mEq by mouth in the morning and at bedtime. ) 360 tablet 0  . levETIRAcetam (KEPPRA) 500 MG tablet TAKE 1 TABLET BY MOUTH TWICE A DAY (Patient taking differently: Take 500 mg by mouth 2 (two) times daily. ) 180  tablet 3  . levothyroxine (SYNTHROID) 88 MCG tablet Take 1 tablet (88 mcg total) by mouth daily before breakfast. 90 tablet 3  . loperamide (IMODIUM) 2 MG capsule Take 4 mg by mouth daily as needed for diarrhea or loose stools (Take as directed). Reported on 05/17/2015    . metFORMIN (GLUCOPHAGE-XR) 500 MG 24 hr tablet Take 2 tablets (1,000 mg total) by mouth in the morning and at bedtime. 360 tablet 3  . methotrexate (RHEUMATREX) 2.5 MG tablet Take 4 tablets (10 mg total) by mouth once a week. Caution:Chemotherapy. Protect from light. 16 tablet 3  . metoprolol tartrate (LOPRESSOR) 50 MG tablet Take 1 tablet (50 mg total) by mouth 2 (two) times daily. 180 tablet 3  . mirtazapine (REMERON) 30 MG tablet Take 1 tablet (30 mg total) by mouth at bedtime. 90 tablet 3  . Multiple Vitamins-Minerals (EYE VITAMINS PO) Take 2 tablets by mouth daily. AREDS formula    . nitrofurantoin (MACRODANTIN) 50 MG capsule Take 50 mg by mouth daily.    . NYSTATIN powder APPLY TWICE DAILY UNDERNEATH BREASTS 60 g 0  . omeprazole (PRILOSEC) 40 MG capsule Take 1 capsule (40 mg total) by mouth daily. 90 capsule 3  . Probiotic Product (PROBIOTIC PO) Take 1 capsule by mouth daily.    . rivaroxaban (XARELTO) 20 MG TABS tablet Take 1 tablet (20 mg total) by mouth daily. 90 tablet 3  . simvastatin (ZOCOR) 40 MG tablet Take 1 tablet (40 mg total) by mouth daily. 90 tablet 3   No current facility-administered medications for this visit.    Allergies as of  12/11/2019 - Review Complete 11/10/2019  Allergen Reaction Noted  . Latex Rash 06/27/2012  . Caudal tray [lidocaine-epinephrine]  06/22/2010  . Codeine Nausea Only   . Diovan [valsartan] Itching 06/22/2010  . Doxycycline Other (See Comments)   . Esomeprazole magnesium  06/22/2010  . Levofloxacin  06/22/2010  . Myrbetriq [mirabegron] Diarrhea 12/01/2013  . Neomycin-bacitracin zn-polymyx    . Penicillins    . Sulfa drugs cross reactors Other (See Comments) 06/22/2010  . Verapamil  06/22/2010  . Vimovo [naproxen-esomeprazole]  06/22/2010    Family History  Problem Relation Age of Onset  . Diabetes Mother   . Heart disease Mother   . Heart failure Mother   . Macular degeneration Mother   . Heart disease Father   . Breast cancer Sister   . Ovarian cancer Sister   . Uterine cancer Paternal Aunt   . Crohn's disease Other        neice  . Diabetes Maternal Grandmother   . Diabetes Sister   . Heart disease Brother   . Heart disease Brother   . Macular degeneration Brother   . Colon cancer Neg Hx   . Stomach cancer Neg Hx     Social History   Socioeconomic History  . Marital status: Married    Spouse name: [name]"  . Number of children: 2  . Years of education: 14 yrs  . Highest education level: Associate degree: academic program  Occupational History  . 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  Tobacco Use  . Smoking status: Never Smoker  . Smokeless tobacco: Never Used  Vaping Use  . Vaping Use: Never used  Substance and Sexual Activity  . Alcohol use: No  . Drug use: No  . Sexual activity: Yes  Other Topics Concern  . Not on file  Social History Narrative   Lives in [place].  Left-handed   Caffeine use: drinks coffee/tea: 2 cups coffee in the morning, drinks 1 glass tea per day    Social Determinants of Health   Financial Resource Strain:   . Difficulty of Paying Living Expenses: Not on file  Food Insecurity:   . Worried  About Charity fundraiser in the Last Year: Not on file  . Ran Out of Food in the Last Year: Not on file  Transportation Needs:   . Lack of Transportation (Medical): Not on file  . Lack of Transportation (Non-Medical): Not on file  Physical Activity:   . Days of Exercise per Week: Not on file  . Minutes of Exercise per Session: Not on file  Stress:   . Feeling of Stress : Not on file  Social Connections:   . Frequency of Communication with Friends and Family: Not on file  . Frequency of Social Gatherings with Friends and Family: Not on file  . Attends Religious Services: Not on file  . Active Member of Clubs or Organizations: Not on file  . Attends Archivist Meetings: Not on file  . Marital Status: Not on file  Intimate Partner Violence:   . Fear of Current or Ex-Partner: Not on file  . Emotionally Abused: Not on file  . Physically Abused: Not on file  . Sexually Abused: Not on file    Review of Systems:    Constitutional: No fever or chills Skin: No rash  Cardiovascular: No chest pain Respiratory: No SOB  Gastrointestinal: See HPI and otherwise negative Genitourinary: No dysuria Neurological: No headache Musculoskeletal: No new muscle or joint pain Hematologic: No bleeding  Psychiatric: No history of depression or anxiety   Physical Exam:  Vital signs: BP 116/66 (BP Location: Left Arm, Patient Position: Sitting, Cuff Size: Normal)   Pulse 88   Constitutional:   Pleasant chronically ill appearing, Elderly Caucasian female appears to be in NAD, Well developed, Well nourished, alert and cooperative Head:  Normocephalic and atraumatic. Eyes:   PEERL, EOMI. No icterus. Conjunctiva pink. Ears:  Normal auditory acuity. Neck:  Supple Throat: Oral cavity and pharynx without inflammation, swelling or lesion.  Respiratory: Respirations even and unlabored. Lungs clear to auscultation bilaterally.   No wheezes, crackles, or rhonchi.  Cardiovascular: Normal S1, S2. No  MRG. Regular rate and rhythm. No peripheral edema, cyanosis or pallor.  Gastrointestinal:  Soft, nondistended, nontender. No rebound or guarding. Normal bowel sounds. No appreciable masses or hepatomegaly.+umbilical hernia, ttp Rectal:  Not performed.  Msk:  Symmetrical without gross deformities. Without edema, no deformity or joint abnormality.           +Ambulates in wheelchair Neurologic:  Alert and  oriented;  grossly normal neurologically. Slurred speech, difficulty with verbalizing Skin:   Dry and intact without significant lesions or rashes. Psychiatric: Demonstrates good judgement and reason without abnormal affect or behaviors.  RELEVANT LABS AND IMAGING: CBC    Component Value Date/Time   WBC 5.8 09/22/2019 0724   RBC 2.62 (L) 09/22/2019 0724   HGB 8.6 (L) 09/22/2019 0724   HGB 13.3 08/06/2019 1421   HCT 27.7 (L) 09/22/2019 0724   HCT 40.0 08/06/2019 1421   PLT 159 09/22/2019 0724   PLT 207 08/06/2019 1421   MCV 105.7 (H) 09/22/2019 0724   MCV 95 08/06/2019 1421   MCH 32.8 09/22/2019 0724   MCHC 31.0 09/22/2019 0724   RDW 17.6 (H) 09/22/2019 0724   RDW 15.8 (H) 08/06/2019 1421   LYMPHSABS 3.0 09/20/2019  2129   LYMPHSABS 2.6 08/06/2019 1421   MONOABS 0.9 09/20/2019 2129   EOSABS 0.3 09/20/2019 2129   EOSABS 0.3 08/06/2019 1421   BASOSABS 0.1 09/20/2019 2129   BASOSABS 0.1 08/06/2019 1421    CMP     Component Value Date/Time   NA 140 09/22/2019 0724   NA 142 08/06/2019 1421   K 4.6 09/22/2019 0724   CL 106 09/22/2019 0724   CO2 23 09/22/2019 0724   GLUCOSE 108 (H) 09/22/2019 0724   BUN 12 09/22/2019 0724   BUN 5 (L) 08/06/2019 1421   CREATININE 0.89 09/22/2019 0724   CREATININE 0.63 06/27/2012 1003   CALCIUM 8.7 (L) 09/22/2019 0724   PROT 4.9 (L) 09/21/2019 0538   PROT 6.3 08/06/2019 1421   ALBUMIN 2.5 (L) 09/21/2019 0538   ALBUMIN 4.0 08/06/2019 1421   AST 38 09/21/2019 0538   ALT 15 09/21/2019 0538   ALKPHOS 48 09/21/2019 0538   BILITOT 1.9 (H)  09/21/2019 0538   BILITOT 1.1 08/06/2019 1421   GFRNONAA >60 09/22/2019 0724   GFRNONAA >89 06/27/2012 1003   GFRAA >60 09/22/2019 0724   GFRAA >89 06/27/2012 1003    Assessment: 1.  Dysphagia: Reports pills and food getting stuck in her throat, distant history of esophageal stricture; consider stricture versus dysmotility status post stroke 8 years ago versus other 2.  Decrease in appetite: Consider relation to umbilical hernia versus gastritis versus other 3.  Weight loss: Cannot quantify how much, but likely due to above 4.  Umbilical hernia: Patient to continue to follow with surgery in regards to repair of this  Plan: 1.  Scheduled the patient for a barium swallow with tablet for further evaluation of dysphagia. 2.  Increase omeprazole to 40 mg twice daily, 30-60 minutes before breakfast and dinner 3.  Discussed that patient's umbilical hernia could be playing a role as this is tender on palpation today and possibly makes it uncomfortable for the patient to eat. 4.  Recommend that the patient's husband try to collect daily or every other day weights so that we can see if she is trending down or up etc. 5.  See below, will have patient follow with Dr. Rush Landmark at his next available appointment.   Addendum: Further review of chart after time of appointment shows a CT abdomen pelvis with contrast on 09/20/2019 during hospitalization after a fall with extensive right lateral hip swelling.  Focal region of mild mesenteric hazy stranding with numerous reactive appearing clustered mid mesenteric lymph nodes compatible with sequela of mesenteritis, hepatic heterogenicity and nodular liver surface contour, which could reflect intrinsic liver disease/cirrhosis, prominent paraesophageal venous collaterals and varices noted suggestive of portal hypertension a large fat-containing periumbilical hernia with a 5.5 x 3.8 cm fascial defect.  Colonic diverticulosis without evidence of diverticulitis,  evidence of prior urethral sling and aortic atherosclerosis.  It does not appear that anyone has worked up cirrhosis, latest platelets were normal.  This may need to be done in the future and could possibly be playing a role in her decrease in appetite?  Patient does have multiple comorbidities and would be very high risk for endoscopic work-up, will have patient meet Dr. Rush Landmark at his next available appointment so that he can discuss further work-up as he sees fit.  Ellouise Newer, PA-C [facility] 12/11/2019, 3:00 PM  Cc: Dettinger, Fransisca Kaufmann, MD

## 2019-12-12 ENCOUNTER — Telehealth: Payer: Self-pay

## 2019-12-12 NOTE — Telephone Encounter (Signed)
I have reviewed the chart.   I agree with the Advanced Practitioner's note, impression, and recommendations with any updates as below. Happy to evaluate the patient further in clinic, however based on the patient's more recent laboratories from August with significant decline in blood counts, it is reasonable for [place] to see how labs look to also help me moving forward when I see her and determine whether more urgent procedure or more urgent clinic visit will need to be performed. RN Gerarda Fraction, please look at the labs that were ordered by her primary care provider and I agree with those being done or if those have been done that we get the results so that we can know in case she needs to be seen sooner for clinic or for procedures. Thank you all.   Justice Britain, MD [facility]

## 2019-12-12 NOTE — Telephone Encounter (Signed)
-----   Message from Irving Copas., MD sent at 12/11/2019  5:19 PM EDT -----   ----- Message ----- From: Levin Erp, PA Sent: 12/11/2019   4:00 PM EDT To: Irving Copas., MD

## 2019-12-16 ENCOUNTER — Other Ambulatory Visit: Payer: Self-pay

## 2019-12-16 DIAGNOSIS — R630 Anorexia: Secondary | ICD-10-CM

## 2019-12-16 DIAGNOSIS — R634 Abnormal weight loss: Secondary | ICD-10-CM

## 2019-12-16 DIAGNOSIS — D508 Other iron deficiency anemias: Secondary | ICD-10-CM

## 2019-12-16 DIAGNOSIS — D62 Acute posthemorrhagic anemia: Secondary | ICD-10-CM

## 2019-12-16 NOTE — Telephone Encounter (Signed)
I spoke with Lauren at the pt home health agency.  She would like order faxed to [phone] and she will get the labs completed and results faxed to our attention.

## 2019-12-16 NOTE — Telephone Encounter (Signed)
Lauren from [facility] is wanting to inform the nurse that they have not drawn any recent labs on the pt, caller would like to know if labs need to be drawn they can.  CB [phone]

## 2019-12-16 NOTE — Telephone Encounter (Signed)
Dr Rush Landmark I spoke to the pt husband and he tells me that the home health agency came out and drew her labs.  He is going to have them fax those to your attention.

## 2019-12-16 NOTE — Telephone Encounter (Signed)
I will be on look out for these. FYI [place]. GM

## 2019-12-17 ENCOUNTER — Telehealth: Payer: Self-pay

## 2019-12-17 NOTE — Telephone Encounter (Signed)
Okay thanks for the information, if she refuses then see if she wants an appointment to be seen here sometime.  If they would like hospice then please put him in contact with that.

## 2019-12-17 NOTE — Telephone Encounter (Signed)
Lauren (Publishing copy from [facility]) called stating that she had a conversation with pts husband yesterday afternoon and was told that pt was complaining about headache yesterday and noticed that pt was drooping over to the opposite side of where she had stroke previously. Reports that pt is also not eating well or wanting to take her meds. Was advised to take pt to the ER and spouse refused. [facility] is going to refer pt for Hospice conversation just to see what that would look like.  Can contact Lauren @ [phone]

## 2019-12-18 NOTE — Telephone Encounter (Addendum)
Labs came thru on fax but were not legible. Patty called  and ask home health to re-fax labs.

## 2019-12-29 ENCOUNTER — Other Ambulatory Visit: Payer: Self-pay

## 2019-12-29 ENCOUNTER — Ambulatory Visit (HOSPITAL_COMMUNITY)
Admission: RE | Admit: 2019-12-29 | Discharge: 2019-12-29 | Disposition: A | Discharge status: Home / Self Care | Payer: Medicare Other | Source: Ambulatory Visit | Attending: Physician Assistant | Admitting: Physician Assistant

## 2019-12-29 DIAGNOSIS — R630 Anorexia: Secondary | ICD-10-CM | POA: Diagnosis present

## 2019-12-29 DIAGNOSIS — R1310 Dysphagia, unspecified: Secondary | ICD-10-CM | POA: Insufficient documentation

## 2019-12-29 DIAGNOSIS — R634 Abnormal weight loss: Secondary | ICD-10-CM | POA: Diagnosis present

## 2020-01-01 ENCOUNTER — Ambulatory Visit: Payer: Medicare Other | Admitting: Family Medicine

## 2020-01-01 ENCOUNTER — Telehealth: Payer: Self-pay

## 2020-01-01 NOTE — Telephone Encounter (Signed)
LMTCB

## 2020-01-02 ENCOUNTER — Telehealth: Payer: Self-pay | Admitting: Family Medicine

## 2020-01-02 NOTE — Telephone Encounter (Signed)
Refer to previous message 

## 2020-01-02 NOTE — Telephone Encounter (Signed)
R/C to [name] 

## 2020-01-02 NOTE — Telephone Encounter (Signed)
Attempted to contact - NA 

## 2020-01-02 NOTE — Telephone Encounter (Signed)
rc for nurse 

## 2020-01-02 NOTE — Telephone Encounter (Signed)
Appointment re scheduled and aware IBS medication is at pharmacy.

## 2020-01-06 ENCOUNTER — Ambulatory Visit (INDEPENDENT_AMBULATORY_CARE_PROVIDER_SITE_OTHER): Payer: PRIVATE HEALTH INSURANCE

## 2020-01-06 DIAGNOSIS — I495 Sick sinus syndrome: Secondary | ICD-10-CM | POA: Diagnosis not present

## 2020-01-06 LAB — CUP PACEART REMOTE DEVICE CHECK
Battery Remaining Longevity: 31 mo
Battery Remaining Percentage: 25 %
Battery Voltage: 2.8 V
Brady Statistic AP VP Percent: 1 %
Brady Statistic AP VS Percent: 1 %
Brady Statistic AS VP Percent: 1 %
Brady Statistic AS VS Percent: 99 %
Brady Statistic RA Percent Paced: 1 %
Brady Statistic RV Percent Paced: 1 %
Date Time Interrogation Session: 20211116120156
Implantable Lead Implant Date: 20100630
Implantable Lead Implant Date: 20100630
Implantable Lead Location: 753859
Implantable Lead Location: 753860
Implantable Pulse Generator Implant Date: 20100630
Lead Channel Impedance Value: 300 Ohm
Lead Channel Impedance Value: 350 Ohm
Lead Channel Pacing Threshold Amplitude: 0.75 V
Lead Channel Pacing Threshold Amplitude: 0.75 V
Lead Channel Pacing Threshold Pulse Width: 0.4 ms
Lead Channel Pacing Threshold Pulse Width: 0.8 ms
Lead Channel Sensing Intrinsic Amplitude: 3.1 mV
Lead Channel Sensing Intrinsic Amplitude: 3.8 mV
Lead Channel Setting Pacing Amplitude: 2 V
Lead Channel Setting Pacing Amplitude: 2.5 V
Lead Channel Setting Pacing Pulse Width: 0.8 ms
Lead Channel Setting Sensing Sensitivity: 1.5 mV
Pulse Gen Model: 2110
Pulse Gen Serial Number: 2285953

## 2020-01-07 ENCOUNTER — Telehealth: Payer: Self-pay

## 2020-01-07 DIAGNOSIS — R197 Diarrhea, unspecified: Secondary | ICD-10-CM

## 2020-01-07 NOTE — Telephone Encounter (Signed)
[name] the speech therapist with [name] called [phone], her fax # is [phone]. She was calling with 2 of the patient's POA's present. She has been out on medical leave herself and has just returned and is trying to help [name] today.  She said [name] is vomiting and they want to know the results of the Barium Swallow that was done 12/29/2019. She had me to read the results. I told her the labs that have been ordered. However I could not find Dr Mansouraty's comments. [name] has an appointment with him 02/03/2020 at 3:10pm. The speech therapy thinks [name] needs a Modified Barium Swallow test. They are admitting her to Hospice however if they can get her to eat maybe she won't have to go.   [name] is also requesting a copy of the Barium Swallow test by faxed to the home office at [facility]. Also a copy of the test needs to be faxed to [name] at the [facility]. Office please, fax # [phone].    [name] said she is also having lots of diarrhea, and wanted to know if any stool testing needs to be done.    Please call her to discuss this case. Thank you.

## 2020-01-07 NOTE — Telephone Encounter (Signed)
Yes, go ahead and order GI pathogen panel.  Thanks-JLL

## 2020-01-07 NOTE — Telephone Encounter (Signed)
Stool test has been ordered

## 2020-01-07 NOTE — Telephone Encounter (Signed)
Results faxed per request to both of the fax numbers provided.

## 2020-01-07 NOTE — Telephone Encounter (Signed)
Stool testing order has been faxed to Mable Paris at [facility].

## 2020-01-07 NOTE — Telephone Encounter (Signed)
[name] please advise if the pt needs stool studies

## 2020-01-08 ENCOUNTER — Telehealth: Payer: Self-pay | Admitting: Physician Assistant

## 2020-01-08 NOTE — Telephone Encounter (Signed)
Nurse Madilyn Fireman called to advise that the patient has declined the stool test and will try back again tomorrow to see if the patient has changed her mind.

## 2020-01-08 NOTE — Telephone Encounter (Signed)
Lauren from [facility] called to advise that the patient has an appointment for next Tuesday would they be able to do the labs then? T:[phone]

## 2020-01-08 NOTE — Telephone Encounter (Signed)
Lauren with [facility] will have the pt stool testing collected as soon as possible.

## 2020-01-08 NOTE — Progress Notes (Signed)
Remote pacemaker transmission.   

## 2020-01-08 NOTE — Telephone Encounter (Signed)
[name]

## 2020-01-09 ENCOUNTER — Telehealth: Payer: Self-pay | Admitting: Physician Assistant

## 2020-01-09 NOTE — Telephone Encounter (Signed)
Pt's daughter Lattie Haw returned your call about results. Pls call her again at [phone].

## 2020-01-09 NOTE — Telephone Encounter (Signed)
Anderson Malta any news from Dr Rush Landmark on imaging results from 11/8.

## 2020-01-09 NOTE — Telephone Encounter (Signed)
Now can you see it?

## 2020-01-09 NOTE — Telephone Encounter (Signed)
Yes, I have it now thanks

## 2020-01-09 NOTE — Telephone Encounter (Signed)
I spoke with the pt's daughter and explained to her the recommendations.  She prefers to wait until the appt on 12/14 to schedule any appts.  The pt also did not want to complete recommended stool studies. They will keep the appt as planned and call back if they decide to proceed prior to the appt.    Levin Erp, PA  [place], Marthenia Rolling, RN Mansouraty, Telford Nab., MD Levin Erp, [place]  Sounds like she needs an EGD.  Repeat labs haven't been done yet to see if things have progressed in regards to her Hgb.  If we need to do her as an EGD/Colon on my upcoming hospital week at end of month, then I am fine with that, I'm not sure of anything that I would have earlier unfortunately.  Let me know what the patient thinks and what the labs end up showing.  Thanks.  GM

## 2020-01-09 NOTE — Telephone Encounter (Signed)
I sent his recs to you, please apologize from me, I thought you were cc'd already  JLL

## 2020-01-09 NOTE — Telephone Encounter (Signed)
I cant see anything from Dr Rush Landmark or you.

## 2020-01-09 NOTE — Telephone Encounter (Signed)
Patiens daughter called for results from test done at [facility] on 12/29/2019.

## 2020-01-14 ENCOUNTER — Ambulatory Visit: Payer: Medicare Other | Admitting: Family Medicine

## 2020-01-23 ENCOUNTER — Other Ambulatory Visit: Payer: Self-pay | Admitting: Family Medicine

## 2020-01-23 DIAGNOSIS — R630 Anorexia: Secondary | ICD-10-CM

## 2020-01-30 ENCOUNTER — Other Ambulatory Visit: Payer: Self-pay | Admitting: Family Medicine

## 2020-01-30 DIAGNOSIS — R630 Anorexia: Secondary | ICD-10-CM

## 2020-02-03 ENCOUNTER — Ambulatory Visit: Payer: Medicare Other | Admitting: Gastroenterology

## 2020-02-21 DIAGNOSIS — I4891 Unspecified atrial fibrillation: Secondary | ICD-10-CM | POA: Diagnosis not present

## 2020-02-21 DIAGNOSIS — H409 Unspecified glaucoma: Secondary | ICD-10-CM | POA: Diagnosis not present

## 2020-02-21 DIAGNOSIS — R609 Edema, unspecified: Secondary | ICD-10-CM | POA: Diagnosis not present

## 2020-02-21 DIAGNOSIS — K589 Irritable bowel syndrome without diarrhea: Secondary | ICD-10-CM | POA: Diagnosis not present

## 2020-02-21 DIAGNOSIS — Z515 Encounter for palliative care: Secondary | ICD-10-CM | POA: Diagnosis not present

## 2020-02-21 DIAGNOSIS — R32 Unspecified urinary incontinence: Secondary | ICD-10-CM | POA: Diagnosis not present

## 2020-02-21 DIAGNOSIS — I10 Essential (primary) hypertension: Secondary | ICD-10-CM | POA: Diagnosis not present

## 2020-02-21 DIAGNOSIS — I69853 Hemiplegia and hemiparesis following other cerebrovascular disease affecting right non-dominant side: Secondary | ICD-10-CM | POA: Diagnosis not present

## 2020-02-21 DIAGNOSIS — E119 Type 2 diabetes mellitus without complications: Secondary | ICD-10-CM | POA: Diagnosis not present

## 2020-02-25 DIAGNOSIS — H409 Unspecified glaucoma: Secondary | ICD-10-CM | POA: Diagnosis not present

## 2020-02-25 DIAGNOSIS — K589 Irritable bowel syndrome without diarrhea: Secondary | ICD-10-CM | POA: Diagnosis not present

## 2020-02-25 DIAGNOSIS — I10 Essential (primary) hypertension: Secondary | ICD-10-CM | POA: Diagnosis not present

## 2020-02-25 DIAGNOSIS — E119 Type 2 diabetes mellitus without complications: Secondary | ICD-10-CM | POA: Diagnosis not present

## 2020-02-25 DIAGNOSIS — I69853 Hemiplegia and hemiparesis following other cerebrovascular disease affecting right non-dominant side: Secondary | ICD-10-CM | POA: Diagnosis not present

## 2020-02-25 DIAGNOSIS — I4891 Unspecified atrial fibrillation: Secondary | ICD-10-CM | POA: Diagnosis not present

## 2020-02-27 DIAGNOSIS — I4891 Unspecified atrial fibrillation: Secondary | ICD-10-CM | POA: Diagnosis not present

## 2020-02-27 DIAGNOSIS — K589 Irritable bowel syndrome without diarrhea: Secondary | ICD-10-CM | POA: Diagnosis not present

## 2020-02-27 DIAGNOSIS — H409 Unspecified glaucoma: Secondary | ICD-10-CM | POA: Diagnosis not present

## 2020-02-27 DIAGNOSIS — I10 Essential (primary) hypertension: Secondary | ICD-10-CM | POA: Diagnosis not present

## 2020-02-27 DIAGNOSIS — I69853 Hemiplegia and hemiparesis following other cerebrovascular disease affecting right non-dominant side: Secondary | ICD-10-CM | POA: Diagnosis not present

## 2020-02-27 DIAGNOSIS — E119 Type 2 diabetes mellitus without complications: Secondary | ICD-10-CM | POA: Diagnosis not present

## 2020-03-01 DIAGNOSIS — I69853 Hemiplegia and hemiparesis following other cerebrovascular disease affecting right non-dominant side: Secondary | ICD-10-CM | POA: Diagnosis not present

## 2020-03-01 DIAGNOSIS — I10 Essential (primary) hypertension: Secondary | ICD-10-CM | POA: Diagnosis not present

## 2020-03-01 DIAGNOSIS — K589 Irritable bowel syndrome without diarrhea: Secondary | ICD-10-CM | POA: Diagnosis not present

## 2020-03-01 DIAGNOSIS — H409 Unspecified glaucoma: Secondary | ICD-10-CM | POA: Diagnosis not present

## 2020-03-01 DIAGNOSIS — E119 Type 2 diabetes mellitus without complications: Secondary | ICD-10-CM | POA: Diagnosis not present

## 2020-03-01 DIAGNOSIS — I4891 Unspecified atrial fibrillation: Secondary | ICD-10-CM | POA: Diagnosis not present

## 2020-03-02 DIAGNOSIS — I4891 Unspecified atrial fibrillation: Secondary | ICD-10-CM | POA: Diagnosis not present

## 2020-03-02 DIAGNOSIS — H409 Unspecified glaucoma: Secondary | ICD-10-CM | POA: Diagnosis not present

## 2020-03-02 DIAGNOSIS — K589 Irritable bowel syndrome without diarrhea: Secondary | ICD-10-CM | POA: Diagnosis not present

## 2020-03-02 DIAGNOSIS — I10 Essential (primary) hypertension: Secondary | ICD-10-CM | POA: Diagnosis not present

## 2020-03-02 DIAGNOSIS — I69853 Hemiplegia and hemiparesis following other cerebrovascular disease affecting right non-dominant side: Secondary | ICD-10-CM | POA: Diagnosis not present

## 2020-03-02 DIAGNOSIS — E119 Type 2 diabetes mellitus without complications: Secondary | ICD-10-CM | POA: Diagnosis not present

## 2020-03-03 DIAGNOSIS — K589 Irritable bowel syndrome without diarrhea: Secondary | ICD-10-CM | POA: Diagnosis not present

## 2020-03-03 DIAGNOSIS — H409 Unspecified glaucoma: Secondary | ICD-10-CM | POA: Diagnosis not present

## 2020-03-03 DIAGNOSIS — I4891 Unspecified atrial fibrillation: Secondary | ICD-10-CM | POA: Diagnosis not present

## 2020-03-03 DIAGNOSIS — I10 Essential (primary) hypertension: Secondary | ICD-10-CM | POA: Diagnosis not present

## 2020-03-03 DIAGNOSIS — I69853 Hemiplegia and hemiparesis following other cerebrovascular disease affecting right non-dominant side: Secondary | ICD-10-CM | POA: Diagnosis not present

## 2020-03-03 DIAGNOSIS — E119 Type 2 diabetes mellitus without complications: Secondary | ICD-10-CM | POA: Diagnosis not present

## 2020-03-04 ENCOUNTER — Other Ambulatory Visit: Payer: Self-pay | Admitting: Family Medicine

## 2020-03-04 DIAGNOSIS — R630 Anorexia: Secondary | ICD-10-CM

## 2020-03-04 DIAGNOSIS — I69853 Hemiplegia and hemiparesis following other cerebrovascular disease affecting right non-dominant side: Secondary | ICD-10-CM | POA: Diagnosis not present

## 2020-03-04 DIAGNOSIS — I4891 Unspecified atrial fibrillation: Secondary | ICD-10-CM | POA: Diagnosis not present

## 2020-03-04 DIAGNOSIS — K589 Irritable bowel syndrome without diarrhea: Secondary | ICD-10-CM | POA: Diagnosis not present

## 2020-03-04 DIAGNOSIS — I10 Essential (primary) hypertension: Secondary | ICD-10-CM | POA: Diagnosis not present

## 2020-03-04 DIAGNOSIS — E119 Type 2 diabetes mellitus without complications: Secondary | ICD-10-CM | POA: Diagnosis not present

## 2020-03-04 DIAGNOSIS — H409 Unspecified glaucoma: Secondary | ICD-10-CM | POA: Diagnosis not present

## 2020-03-04 NOTE — Telephone Encounter (Signed)
Dettinger NTBS 30 days given 02/10/20

## 2020-03-05 DIAGNOSIS — I69853 Hemiplegia and hemiparesis following other cerebrovascular disease affecting right non-dominant side: Secondary | ICD-10-CM | POA: Diagnosis not present

## 2020-03-05 DIAGNOSIS — H409 Unspecified glaucoma: Secondary | ICD-10-CM | POA: Diagnosis not present

## 2020-03-05 DIAGNOSIS — H26491 Other secondary cataract, right eye: Secondary | ICD-10-CM | POA: Diagnosis not present

## 2020-03-05 DIAGNOSIS — E119 Type 2 diabetes mellitus without complications: Secondary | ICD-10-CM | POA: Diagnosis not present

## 2020-03-05 DIAGNOSIS — I4891 Unspecified atrial fibrillation: Secondary | ICD-10-CM | POA: Diagnosis not present

## 2020-03-05 DIAGNOSIS — I10 Essential (primary) hypertension: Secondary | ICD-10-CM | POA: Diagnosis not present

## 2020-03-05 DIAGNOSIS — H35363 Drusen (degenerative) of macula, bilateral: Secondary | ICD-10-CM | POA: Diagnosis not present

## 2020-03-05 DIAGNOSIS — K589 Irritable bowel syndrome without diarrhea: Secondary | ICD-10-CM | POA: Diagnosis not present

## 2020-03-08 DIAGNOSIS — K589 Irritable bowel syndrome without diarrhea: Secondary | ICD-10-CM | POA: Diagnosis not present

## 2020-03-08 DIAGNOSIS — E119 Type 2 diabetes mellitus without complications: Secondary | ICD-10-CM | POA: Diagnosis not present

## 2020-03-08 DIAGNOSIS — I10 Essential (primary) hypertension: Secondary | ICD-10-CM | POA: Diagnosis not present

## 2020-03-08 DIAGNOSIS — I4891 Unspecified atrial fibrillation: Secondary | ICD-10-CM | POA: Diagnosis not present

## 2020-03-08 DIAGNOSIS — H409 Unspecified glaucoma: Secondary | ICD-10-CM | POA: Diagnosis not present

## 2020-03-08 DIAGNOSIS — I69853 Hemiplegia and hemiparesis following other cerebrovascular disease affecting right non-dominant side: Secondary | ICD-10-CM | POA: Diagnosis not present

## 2020-03-11 DIAGNOSIS — I10 Essential (primary) hypertension: Secondary | ICD-10-CM | POA: Diagnosis not present

## 2020-03-11 DIAGNOSIS — H409 Unspecified glaucoma: Secondary | ICD-10-CM | POA: Diagnosis not present

## 2020-03-11 DIAGNOSIS — K589 Irritable bowel syndrome without diarrhea: Secondary | ICD-10-CM | POA: Diagnosis not present

## 2020-03-11 DIAGNOSIS — I69853 Hemiplegia and hemiparesis following other cerebrovascular disease affecting right non-dominant side: Secondary | ICD-10-CM | POA: Diagnosis not present

## 2020-03-11 DIAGNOSIS — I4891 Unspecified atrial fibrillation: Secondary | ICD-10-CM | POA: Diagnosis not present

## 2020-03-11 DIAGNOSIS — E119 Type 2 diabetes mellitus without complications: Secondary | ICD-10-CM | POA: Diagnosis not present

## 2020-03-12 DIAGNOSIS — I4891 Unspecified atrial fibrillation: Secondary | ICD-10-CM | POA: Diagnosis not present

## 2020-03-12 DIAGNOSIS — K589 Irritable bowel syndrome without diarrhea: Secondary | ICD-10-CM | POA: Diagnosis not present

## 2020-03-12 DIAGNOSIS — H409 Unspecified glaucoma: Secondary | ICD-10-CM | POA: Diagnosis not present

## 2020-03-12 DIAGNOSIS — I69853 Hemiplegia and hemiparesis following other cerebrovascular disease affecting right non-dominant side: Secondary | ICD-10-CM | POA: Diagnosis not present

## 2020-03-12 DIAGNOSIS — I10 Essential (primary) hypertension: Secondary | ICD-10-CM | POA: Diagnosis not present

## 2020-03-12 DIAGNOSIS — E119 Type 2 diabetes mellitus without complications: Secondary | ICD-10-CM | POA: Diagnosis not present

## 2020-03-13 DIAGNOSIS — I10 Essential (primary) hypertension: Secondary | ICD-10-CM | POA: Diagnosis not present

## 2020-03-13 DIAGNOSIS — I69853 Hemiplegia and hemiparesis following other cerebrovascular disease affecting right non-dominant side: Secondary | ICD-10-CM | POA: Diagnosis not present

## 2020-03-13 DIAGNOSIS — K589 Irritable bowel syndrome without diarrhea: Secondary | ICD-10-CM | POA: Diagnosis not present

## 2020-03-13 DIAGNOSIS — H409 Unspecified glaucoma: Secondary | ICD-10-CM | POA: Diagnosis not present

## 2020-03-13 DIAGNOSIS — I4891 Unspecified atrial fibrillation: Secondary | ICD-10-CM | POA: Diagnosis not present

## 2020-03-13 DIAGNOSIS — E119 Type 2 diabetes mellitus without complications: Secondary | ICD-10-CM | POA: Diagnosis not present

## 2020-03-15 DIAGNOSIS — I10 Essential (primary) hypertension: Secondary | ICD-10-CM | POA: Diagnosis not present

## 2020-03-15 DIAGNOSIS — I4891 Unspecified atrial fibrillation: Secondary | ICD-10-CM | POA: Diagnosis not present

## 2020-03-15 DIAGNOSIS — E119 Type 2 diabetes mellitus without complications: Secondary | ICD-10-CM | POA: Diagnosis not present

## 2020-03-15 DIAGNOSIS — H409 Unspecified glaucoma: Secondary | ICD-10-CM | POA: Diagnosis not present

## 2020-03-15 DIAGNOSIS — I69853 Hemiplegia and hemiparesis following other cerebrovascular disease affecting right non-dominant side: Secondary | ICD-10-CM | POA: Diagnosis not present

## 2020-03-15 DIAGNOSIS — K589 Irritable bowel syndrome without diarrhea: Secondary | ICD-10-CM | POA: Diagnosis not present

## 2020-03-16 DIAGNOSIS — I69853 Hemiplegia and hemiparesis following other cerebrovascular disease affecting right non-dominant side: Secondary | ICD-10-CM | POA: Diagnosis not present

## 2020-03-16 DIAGNOSIS — I4891 Unspecified atrial fibrillation: Secondary | ICD-10-CM | POA: Diagnosis not present

## 2020-03-16 DIAGNOSIS — K589 Irritable bowel syndrome without diarrhea: Secondary | ICD-10-CM | POA: Diagnosis not present

## 2020-03-16 DIAGNOSIS — H409 Unspecified glaucoma: Secondary | ICD-10-CM | POA: Diagnosis not present

## 2020-03-16 DIAGNOSIS — I10 Essential (primary) hypertension: Secondary | ICD-10-CM | POA: Diagnosis not present

## 2020-03-16 DIAGNOSIS — E119 Type 2 diabetes mellitus without complications: Secondary | ICD-10-CM | POA: Diagnosis not present

## 2020-03-18 DIAGNOSIS — K589 Irritable bowel syndrome without diarrhea: Secondary | ICD-10-CM | POA: Diagnosis not present

## 2020-03-18 DIAGNOSIS — I10 Essential (primary) hypertension: Secondary | ICD-10-CM | POA: Diagnosis not present

## 2020-03-18 DIAGNOSIS — E119 Type 2 diabetes mellitus without complications: Secondary | ICD-10-CM | POA: Diagnosis not present

## 2020-03-18 DIAGNOSIS — I69853 Hemiplegia and hemiparesis following other cerebrovascular disease affecting right non-dominant side: Secondary | ICD-10-CM | POA: Diagnosis not present

## 2020-03-18 DIAGNOSIS — H409 Unspecified glaucoma: Secondary | ICD-10-CM | POA: Diagnosis not present

## 2020-03-18 DIAGNOSIS — I4891 Unspecified atrial fibrillation: Secondary | ICD-10-CM | POA: Diagnosis not present

## 2020-03-19 DIAGNOSIS — I69853 Hemiplegia and hemiparesis following other cerebrovascular disease affecting right non-dominant side: Secondary | ICD-10-CM | POA: Diagnosis not present

## 2020-03-19 DIAGNOSIS — E119 Type 2 diabetes mellitus without complications: Secondary | ICD-10-CM | POA: Diagnosis not present

## 2020-03-19 DIAGNOSIS — K589 Irritable bowel syndrome without diarrhea: Secondary | ICD-10-CM | POA: Diagnosis not present

## 2020-03-19 DIAGNOSIS — I4891 Unspecified atrial fibrillation: Secondary | ICD-10-CM | POA: Diagnosis not present

## 2020-03-19 DIAGNOSIS — H409 Unspecified glaucoma: Secondary | ICD-10-CM | POA: Diagnosis not present

## 2020-03-19 DIAGNOSIS — I10 Essential (primary) hypertension: Secondary | ICD-10-CM | POA: Diagnosis not present

## 2020-03-22 ENCOUNTER — Telehealth: Payer: Self-pay | Admitting: Internal Medicine

## 2020-03-22 DIAGNOSIS — I69853 Hemiplegia and hemiparesis following other cerebrovascular disease affecting right non-dominant side: Secondary | ICD-10-CM | POA: Diagnosis not present

## 2020-03-22 DIAGNOSIS — H409 Unspecified glaucoma: Secondary | ICD-10-CM | POA: Diagnosis not present

## 2020-03-22 DIAGNOSIS — I10 Essential (primary) hypertension: Secondary | ICD-10-CM | POA: Diagnosis not present

## 2020-03-22 DIAGNOSIS — I4891 Unspecified atrial fibrillation: Secondary | ICD-10-CM | POA: Diagnosis not present

## 2020-03-22 DIAGNOSIS — E119 Type 2 diabetes mellitus without complications: Secondary | ICD-10-CM | POA: Diagnosis not present

## 2020-03-22 DIAGNOSIS — K589 Irritable bowel syndrome without diarrhea: Secondary | ICD-10-CM | POA: Diagnosis not present

## 2020-03-22 NOTE — Telephone Encounter (Signed)
Spoke with the patients daughter, Lattie Haw who is calling requesting to speak with Dr. Forde Dandy RN. Advised that she is out of office. Patients daughter is concerned because patient has been switched from Xarelto to Warfarin by her Hospice provider. Lattie Haw states that she is unsure why this medication has been changed but several of her other medications have also been discontinued. She wanted to make sure that Dr. Lovena Le would be okay with all these medication changes. Lattie Haw states patient has been under Hospice care for about 2 months but she does not think her mother is ill enough to be receiving Hospice care. This RN asked Lattie Haw if patient was terminal, she states she is unsure. Lattie Haw would like Dr. Lovena Le to advise on patients condition and medications. Routing to Dr. Lovena Le and his RN for additional advisement.

## 2020-03-22 NOTE — Telephone Encounter (Signed)
    Pt c/o medication issue:  1. Name of Medication: warfarin and xarelto  2. How are you currently taking this medication (dosage and times per day)?   3. Are you having a reaction (difficulty breathing--STAT)?   4. What is your medication issue? Lattie Haw would like to speak with Dr. Tanna Furry nurse, she said she have questions about these medications, like what are the side effects

## 2020-03-22 NOTE — Telephone Encounter (Signed)
Warfarin is ok provide [name] is getting her INR checked a couple of times a month initially and then monthly once the INR is therapeutic and stable. GT

## 2020-03-23 DIAGNOSIS — I69853 Hemiplegia and hemiparesis following other cerebrovascular disease affecting right non-dominant side: Secondary | ICD-10-CM | POA: Diagnosis not present

## 2020-03-23 DIAGNOSIS — I10 Essential (primary) hypertension: Secondary | ICD-10-CM | POA: Diagnosis not present

## 2020-03-23 DIAGNOSIS — E119 Type 2 diabetes mellitus without complications: Secondary | ICD-10-CM | POA: Diagnosis not present

## 2020-03-23 DIAGNOSIS — K589 Irritable bowel syndrome without diarrhea: Secondary | ICD-10-CM | POA: Diagnosis not present

## 2020-03-23 DIAGNOSIS — I4891 Unspecified atrial fibrillation: Secondary | ICD-10-CM | POA: Diagnosis not present

## 2020-03-23 DIAGNOSIS — H409 Unspecified glaucoma: Secondary | ICD-10-CM | POA: Diagnosis not present

## 2020-03-23 DIAGNOSIS — R32 Unspecified urinary incontinence: Secondary | ICD-10-CM | POA: Diagnosis not present

## 2020-03-23 DIAGNOSIS — Z515 Encounter for palliative care: Secondary | ICD-10-CM | POA: Diagnosis not present

## 2020-03-23 DIAGNOSIS — R609 Edema, unspecified: Secondary | ICD-10-CM | POA: Diagnosis not present

## 2020-03-24 DIAGNOSIS — I4891 Unspecified atrial fibrillation: Secondary | ICD-10-CM | POA: Diagnosis not present

## 2020-03-24 DIAGNOSIS — E119 Type 2 diabetes mellitus without complications: Secondary | ICD-10-CM | POA: Diagnosis not present

## 2020-03-24 DIAGNOSIS — H409 Unspecified glaucoma: Secondary | ICD-10-CM | POA: Diagnosis not present

## 2020-03-24 DIAGNOSIS — I10 Essential (primary) hypertension: Secondary | ICD-10-CM | POA: Diagnosis not present

## 2020-03-24 DIAGNOSIS — I69853 Hemiplegia and hemiparesis following other cerebrovascular disease affecting right non-dominant side: Secondary | ICD-10-CM | POA: Diagnosis not present

## 2020-03-24 DIAGNOSIS — K589 Irritable bowel syndrome without diarrhea: Secondary | ICD-10-CM | POA: Diagnosis not present

## 2020-03-25 NOTE — Telephone Encounter (Signed)
Returned call to daughter.  She was concerned that hospice had changed Pt from xarelto to warfarin.  Advised per Dr. Lovena Le that is ok- she just needs to have her INR monitored.  No further questions.

## 2020-03-26 DIAGNOSIS — I4891 Unspecified atrial fibrillation: Secondary | ICD-10-CM | POA: Diagnosis not present

## 2020-03-26 DIAGNOSIS — K589 Irritable bowel syndrome without diarrhea: Secondary | ICD-10-CM | POA: Diagnosis not present

## 2020-03-26 DIAGNOSIS — I10 Essential (primary) hypertension: Secondary | ICD-10-CM | POA: Diagnosis not present

## 2020-03-26 DIAGNOSIS — H409 Unspecified glaucoma: Secondary | ICD-10-CM | POA: Diagnosis not present

## 2020-03-26 DIAGNOSIS — I69853 Hemiplegia and hemiparesis following other cerebrovascular disease affecting right non-dominant side: Secondary | ICD-10-CM | POA: Diagnosis not present

## 2020-03-26 DIAGNOSIS — E119 Type 2 diabetes mellitus without complications: Secondary | ICD-10-CM | POA: Diagnosis not present

## 2020-03-29 DIAGNOSIS — H409 Unspecified glaucoma: Secondary | ICD-10-CM | POA: Diagnosis not present

## 2020-03-29 DIAGNOSIS — K589 Irritable bowel syndrome without diarrhea: Secondary | ICD-10-CM | POA: Diagnosis not present

## 2020-03-29 DIAGNOSIS — I10 Essential (primary) hypertension: Secondary | ICD-10-CM | POA: Diagnosis not present

## 2020-03-29 DIAGNOSIS — I69853 Hemiplegia and hemiparesis following other cerebrovascular disease affecting right non-dominant side: Secondary | ICD-10-CM | POA: Diagnosis not present

## 2020-03-29 DIAGNOSIS — I4891 Unspecified atrial fibrillation: Secondary | ICD-10-CM | POA: Diagnosis not present

## 2020-03-29 DIAGNOSIS — E119 Type 2 diabetes mellitus without complications: Secondary | ICD-10-CM | POA: Diagnosis not present

## 2020-03-30 DIAGNOSIS — E119 Type 2 diabetes mellitus without complications: Secondary | ICD-10-CM | POA: Diagnosis not present

## 2020-03-30 DIAGNOSIS — I10 Essential (primary) hypertension: Secondary | ICD-10-CM | POA: Diagnosis not present

## 2020-03-30 DIAGNOSIS — K589 Irritable bowel syndrome without diarrhea: Secondary | ICD-10-CM | POA: Diagnosis not present

## 2020-03-30 DIAGNOSIS — I69853 Hemiplegia and hemiparesis following other cerebrovascular disease affecting right non-dominant side: Secondary | ICD-10-CM | POA: Diagnosis not present

## 2020-03-30 DIAGNOSIS — H409 Unspecified glaucoma: Secondary | ICD-10-CM | POA: Diagnosis not present

## 2020-03-30 DIAGNOSIS — I4891 Unspecified atrial fibrillation: Secondary | ICD-10-CM | POA: Diagnosis not present

## 2020-03-31 DIAGNOSIS — I4891 Unspecified atrial fibrillation: Secondary | ICD-10-CM | POA: Diagnosis not present

## 2020-03-31 DIAGNOSIS — I10 Essential (primary) hypertension: Secondary | ICD-10-CM | POA: Diagnosis not present

## 2020-03-31 DIAGNOSIS — I69853 Hemiplegia and hemiparesis following other cerebrovascular disease affecting right non-dominant side: Secondary | ICD-10-CM | POA: Diagnosis not present

## 2020-03-31 DIAGNOSIS — E119 Type 2 diabetes mellitus without complications: Secondary | ICD-10-CM | POA: Diagnosis not present

## 2020-03-31 DIAGNOSIS — H409 Unspecified glaucoma: Secondary | ICD-10-CM | POA: Diagnosis not present

## 2020-03-31 DIAGNOSIS — K589 Irritable bowel syndrome without diarrhea: Secondary | ICD-10-CM | POA: Diagnosis not present

## 2020-04-01 DIAGNOSIS — H409 Unspecified glaucoma: Secondary | ICD-10-CM | POA: Diagnosis not present

## 2020-04-01 DIAGNOSIS — I4891 Unspecified atrial fibrillation: Secondary | ICD-10-CM | POA: Diagnosis not present

## 2020-04-01 DIAGNOSIS — I10 Essential (primary) hypertension: Secondary | ICD-10-CM | POA: Diagnosis not present

## 2020-04-01 DIAGNOSIS — K589 Irritable bowel syndrome without diarrhea: Secondary | ICD-10-CM | POA: Diagnosis not present

## 2020-04-01 DIAGNOSIS — E119 Type 2 diabetes mellitus without complications: Secondary | ICD-10-CM | POA: Diagnosis not present

## 2020-04-01 DIAGNOSIS — I69853 Hemiplegia and hemiparesis following other cerebrovascular disease affecting right non-dominant side: Secondary | ICD-10-CM | POA: Diagnosis not present

## 2020-04-02 DIAGNOSIS — I10 Essential (primary) hypertension: Secondary | ICD-10-CM | POA: Diagnosis not present

## 2020-04-02 DIAGNOSIS — I4891 Unspecified atrial fibrillation: Secondary | ICD-10-CM | POA: Diagnosis not present

## 2020-04-02 DIAGNOSIS — H409 Unspecified glaucoma: Secondary | ICD-10-CM | POA: Diagnosis not present

## 2020-04-02 DIAGNOSIS — I69853 Hemiplegia and hemiparesis following other cerebrovascular disease affecting right non-dominant side: Secondary | ICD-10-CM | POA: Diagnosis not present

## 2020-04-02 DIAGNOSIS — K589 Irritable bowel syndrome without diarrhea: Secondary | ICD-10-CM | POA: Diagnosis not present

## 2020-04-02 DIAGNOSIS — E119 Type 2 diabetes mellitus without complications: Secondary | ICD-10-CM | POA: Diagnosis not present

## 2020-04-05 DIAGNOSIS — H409 Unspecified glaucoma: Secondary | ICD-10-CM | POA: Diagnosis not present

## 2020-04-05 DIAGNOSIS — I69853 Hemiplegia and hemiparesis following other cerebrovascular disease affecting right non-dominant side: Secondary | ICD-10-CM | POA: Diagnosis not present

## 2020-04-05 DIAGNOSIS — E119 Type 2 diabetes mellitus without complications: Secondary | ICD-10-CM | POA: Diagnosis not present

## 2020-04-05 DIAGNOSIS — K589 Irritable bowel syndrome without diarrhea: Secondary | ICD-10-CM | POA: Diagnosis not present

## 2020-04-05 DIAGNOSIS — I10 Essential (primary) hypertension: Secondary | ICD-10-CM | POA: Diagnosis not present

## 2020-04-05 DIAGNOSIS — I4891 Unspecified atrial fibrillation: Secondary | ICD-10-CM | POA: Diagnosis not present

## 2020-04-06 DIAGNOSIS — I69853 Hemiplegia and hemiparesis following other cerebrovascular disease affecting right non-dominant side: Secondary | ICD-10-CM | POA: Diagnosis not present

## 2020-04-06 DIAGNOSIS — K589 Irritable bowel syndrome without diarrhea: Secondary | ICD-10-CM | POA: Diagnosis not present

## 2020-04-06 DIAGNOSIS — I4891 Unspecified atrial fibrillation: Secondary | ICD-10-CM | POA: Diagnosis not present

## 2020-04-06 DIAGNOSIS — H409 Unspecified glaucoma: Secondary | ICD-10-CM | POA: Diagnosis not present

## 2020-04-06 DIAGNOSIS — E119 Type 2 diabetes mellitus without complications: Secondary | ICD-10-CM | POA: Diagnosis not present

## 2020-04-06 DIAGNOSIS — I10 Essential (primary) hypertension: Secondary | ICD-10-CM | POA: Diagnosis not present

## 2020-04-07 ENCOUNTER — Ambulatory Visit: Payer: Medicare Other | Admitting: Neurology

## 2020-04-07 DIAGNOSIS — H409 Unspecified glaucoma: Secondary | ICD-10-CM | POA: Diagnosis not present

## 2020-04-07 DIAGNOSIS — I69853 Hemiplegia and hemiparesis following other cerebrovascular disease affecting right non-dominant side: Secondary | ICD-10-CM | POA: Diagnosis not present

## 2020-04-07 DIAGNOSIS — H26493 Other secondary cataract, bilateral: Secondary | ICD-10-CM | POA: Diagnosis not present

## 2020-04-07 DIAGNOSIS — K589 Irritable bowel syndrome without diarrhea: Secondary | ICD-10-CM | POA: Diagnosis not present

## 2020-04-07 DIAGNOSIS — E119 Type 2 diabetes mellitus without complications: Secondary | ICD-10-CM | POA: Diagnosis not present

## 2020-04-07 DIAGNOSIS — I4891 Unspecified atrial fibrillation: Secondary | ICD-10-CM | POA: Diagnosis not present

## 2020-04-07 DIAGNOSIS — H26491 Other secondary cataract, right eye: Secondary | ICD-10-CM | POA: Diagnosis not present

## 2020-04-07 DIAGNOSIS — I10 Essential (primary) hypertension: Secondary | ICD-10-CM | POA: Diagnosis not present

## 2020-04-07 DIAGNOSIS — H26492 Other secondary cataract, left eye: Secondary | ICD-10-CM | POA: Diagnosis not present

## 2020-04-09 DIAGNOSIS — I10 Essential (primary) hypertension: Secondary | ICD-10-CM | POA: Diagnosis not present

## 2020-04-09 DIAGNOSIS — I4891 Unspecified atrial fibrillation: Secondary | ICD-10-CM | POA: Diagnosis not present

## 2020-04-09 DIAGNOSIS — I69853 Hemiplegia and hemiparesis following other cerebrovascular disease affecting right non-dominant side: Secondary | ICD-10-CM | POA: Diagnosis not present

## 2020-04-09 DIAGNOSIS — H409 Unspecified glaucoma: Secondary | ICD-10-CM | POA: Diagnosis not present

## 2020-04-09 DIAGNOSIS — K589 Irritable bowel syndrome without diarrhea: Secondary | ICD-10-CM | POA: Diagnosis not present

## 2020-04-09 DIAGNOSIS — E119 Type 2 diabetes mellitus without complications: Secondary | ICD-10-CM | POA: Diagnosis not present

## 2020-04-12 DIAGNOSIS — E119 Type 2 diabetes mellitus without complications: Secondary | ICD-10-CM | POA: Diagnosis not present

## 2020-04-12 DIAGNOSIS — K589 Irritable bowel syndrome without diarrhea: Secondary | ICD-10-CM | POA: Diagnosis not present

## 2020-04-12 DIAGNOSIS — I69853 Hemiplegia and hemiparesis following other cerebrovascular disease affecting right non-dominant side: Secondary | ICD-10-CM | POA: Diagnosis not present

## 2020-04-12 DIAGNOSIS — H409 Unspecified glaucoma: Secondary | ICD-10-CM | POA: Diagnosis not present

## 2020-04-12 DIAGNOSIS — I4891 Unspecified atrial fibrillation: Secondary | ICD-10-CM | POA: Diagnosis not present

## 2020-04-12 DIAGNOSIS — I10 Essential (primary) hypertension: Secondary | ICD-10-CM | POA: Diagnosis not present

## 2020-04-14 DIAGNOSIS — I4891 Unspecified atrial fibrillation: Secondary | ICD-10-CM | POA: Diagnosis not present

## 2020-04-14 DIAGNOSIS — I69853 Hemiplegia and hemiparesis following other cerebrovascular disease affecting right non-dominant side: Secondary | ICD-10-CM | POA: Diagnosis not present

## 2020-04-14 DIAGNOSIS — E119 Type 2 diabetes mellitus without complications: Secondary | ICD-10-CM | POA: Diagnosis not present

## 2020-04-14 DIAGNOSIS — I10 Essential (primary) hypertension: Secondary | ICD-10-CM | POA: Diagnosis not present

## 2020-04-14 DIAGNOSIS — K589 Irritable bowel syndrome without diarrhea: Secondary | ICD-10-CM | POA: Diagnosis not present

## 2020-04-14 DIAGNOSIS — H409 Unspecified glaucoma: Secondary | ICD-10-CM | POA: Diagnosis not present

## 2020-04-15 DIAGNOSIS — K589 Irritable bowel syndrome without diarrhea: Secondary | ICD-10-CM | POA: Diagnosis not present

## 2020-04-15 DIAGNOSIS — E119 Type 2 diabetes mellitus without complications: Secondary | ICD-10-CM | POA: Diagnosis not present

## 2020-04-15 DIAGNOSIS — H3530 Unspecified macular degeneration: Secondary | ICD-10-CM | POA: Diagnosis not present

## 2020-04-15 DIAGNOSIS — Z961 Presence of intraocular lens: Secondary | ICD-10-CM | POA: Diagnosis not present

## 2020-04-15 DIAGNOSIS — H26493 Other secondary cataract, bilateral: Secondary | ICD-10-CM | POA: Diagnosis not present

## 2020-04-15 DIAGNOSIS — H43813 Vitreous degeneration, bilateral: Secondary | ICD-10-CM | POA: Diagnosis not present

## 2020-04-15 DIAGNOSIS — I4891 Unspecified atrial fibrillation: Secondary | ICD-10-CM | POA: Diagnosis not present

## 2020-04-15 DIAGNOSIS — I69853 Hemiplegia and hemiparesis following other cerebrovascular disease affecting right non-dominant side: Secondary | ICD-10-CM | POA: Diagnosis not present

## 2020-04-15 DIAGNOSIS — H409 Unspecified glaucoma: Secondary | ICD-10-CM | POA: Diagnosis not present

## 2020-04-15 DIAGNOSIS — I10 Essential (primary) hypertension: Secondary | ICD-10-CM | POA: Diagnosis not present

## 2020-04-16 DIAGNOSIS — K589 Irritable bowel syndrome without diarrhea: Secondary | ICD-10-CM | POA: Diagnosis not present

## 2020-04-16 DIAGNOSIS — I4891 Unspecified atrial fibrillation: Secondary | ICD-10-CM | POA: Diagnosis not present

## 2020-04-16 DIAGNOSIS — I10 Essential (primary) hypertension: Secondary | ICD-10-CM | POA: Diagnosis not present

## 2020-04-16 DIAGNOSIS — I69853 Hemiplegia and hemiparesis following other cerebrovascular disease affecting right non-dominant side: Secondary | ICD-10-CM | POA: Diagnosis not present

## 2020-04-16 DIAGNOSIS — H409 Unspecified glaucoma: Secondary | ICD-10-CM | POA: Diagnosis not present

## 2020-04-16 DIAGNOSIS — E119 Type 2 diabetes mellitus without complications: Secondary | ICD-10-CM | POA: Diagnosis not present

## 2020-04-18 DIAGNOSIS — H409 Unspecified glaucoma: Secondary | ICD-10-CM | POA: Diagnosis not present

## 2020-04-18 DIAGNOSIS — K589 Irritable bowel syndrome without diarrhea: Secondary | ICD-10-CM | POA: Diagnosis not present

## 2020-04-18 DIAGNOSIS — I4891 Unspecified atrial fibrillation: Secondary | ICD-10-CM | POA: Diagnosis not present

## 2020-04-18 DIAGNOSIS — I10 Essential (primary) hypertension: Secondary | ICD-10-CM | POA: Diagnosis not present

## 2020-04-18 DIAGNOSIS — E119 Type 2 diabetes mellitus without complications: Secondary | ICD-10-CM | POA: Diagnosis not present

## 2020-04-18 DIAGNOSIS — I69853 Hemiplegia and hemiparesis following other cerebrovascular disease affecting right non-dominant side: Secondary | ICD-10-CM | POA: Diagnosis not present

## 2020-04-19 DIAGNOSIS — I4891 Unspecified atrial fibrillation: Secondary | ICD-10-CM | POA: Diagnosis not present

## 2020-04-19 DIAGNOSIS — I69853 Hemiplegia and hemiparesis following other cerebrovascular disease affecting right non-dominant side: Secondary | ICD-10-CM | POA: Diagnosis not present

## 2020-04-19 DIAGNOSIS — K589 Irritable bowel syndrome without diarrhea: Secondary | ICD-10-CM | POA: Diagnosis not present

## 2020-04-19 DIAGNOSIS — E119 Type 2 diabetes mellitus without complications: Secondary | ICD-10-CM | POA: Diagnosis not present

## 2020-04-19 DIAGNOSIS — H409 Unspecified glaucoma: Secondary | ICD-10-CM | POA: Diagnosis not present

## 2020-04-19 DIAGNOSIS — I10 Essential (primary) hypertension: Secondary | ICD-10-CM | POA: Diagnosis not present

## 2020-04-20 DIAGNOSIS — K589 Irritable bowel syndrome without diarrhea: Secondary | ICD-10-CM | POA: Diagnosis not present

## 2020-04-20 DIAGNOSIS — Z515 Encounter for palliative care: Secondary | ICD-10-CM | POA: Diagnosis not present

## 2020-04-20 DIAGNOSIS — I69853 Hemiplegia and hemiparesis following other cerebrovascular disease affecting right non-dominant side: Secondary | ICD-10-CM | POA: Diagnosis not present

## 2020-04-20 DIAGNOSIS — E119 Type 2 diabetes mellitus without complications: Secondary | ICD-10-CM | POA: Diagnosis not present

## 2020-04-20 DIAGNOSIS — I10 Essential (primary) hypertension: Secondary | ICD-10-CM | POA: Diagnosis not present

## 2020-04-20 DIAGNOSIS — I4891 Unspecified atrial fibrillation: Secondary | ICD-10-CM | POA: Diagnosis not present

## 2020-04-20 DIAGNOSIS — R609 Edema, unspecified: Secondary | ICD-10-CM | POA: Diagnosis not present

## 2020-04-20 DIAGNOSIS — R32 Unspecified urinary incontinence: Secondary | ICD-10-CM | POA: Diagnosis not present

## 2020-04-20 DIAGNOSIS — H409 Unspecified glaucoma: Secondary | ICD-10-CM | POA: Diagnosis not present

## 2020-04-21 DIAGNOSIS — K589 Irritable bowel syndrome without diarrhea: Secondary | ICD-10-CM | POA: Diagnosis not present

## 2020-04-21 DIAGNOSIS — I69853 Hemiplegia and hemiparesis following other cerebrovascular disease affecting right non-dominant side: Secondary | ICD-10-CM | POA: Diagnosis not present

## 2020-04-21 DIAGNOSIS — E119 Type 2 diabetes mellitus without complications: Secondary | ICD-10-CM | POA: Diagnosis not present

## 2020-04-21 DIAGNOSIS — I4891 Unspecified atrial fibrillation: Secondary | ICD-10-CM | POA: Diagnosis not present

## 2020-04-21 DIAGNOSIS — I10 Essential (primary) hypertension: Secondary | ICD-10-CM | POA: Diagnosis not present

## 2020-04-21 DIAGNOSIS — H409 Unspecified glaucoma: Secondary | ICD-10-CM | POA: Diagnosis not present

## 2020-04-23 DIAGNOSIS — H409 Unspecified glaucoma: Secondary | ICD-10-CM | POA: Diagnosis not present

## 2020-04-23 DIAGNOSIS — I4891 Unspecified atrial fibrillation: Secondary | ICD-10-CM | POA: Diagnosis not present

## 2020-04-23 DIAGNOSIS — E119 Type 2 diabetes mellitus without complications: Secondary | ICD-10-CM | POA: Diagnosis not present

## 2020-04-23 DIAGNOSIS — K589 Irritable bowel syndrome without diarrhea: Secondary | ICD-10-CM | POA: Diagnosis not present

## 2020-04-23 DIAGNOSIS — I69853 Hemiplegia and hemiparesis following other cerebrovascular disease affecting right non-dominant side: Secondary | ICD-10-CM | POA: Diagnosis not present

## 2020-04-23 DIAGNOSIS — I10 Essential (primary) hypertension: Secondary | ICD-10-CM | POA: Diagnosis not present

## 2020-04-26 DIAGNOSIS — E119 Type 2 diabetes mellitus without complications: Secondary | ICD-10-CM | POA: Diagnosis not present

## 2020-04-26 DIAGNOSIS — H409 Unspecified glaucoma: Secondary | ICD-10-CM | POA: Diagnosis not present

## 2020-04-26 DIAGNOSIS — I69853 Hemiplegia and hemiparesis following other cerebrovascular disease affecting right non-dominant side: Secondary | ICD-10-CM | POA: Diagnosis not present

## 2020-04-26 DIAGNOSIS — I10 Essential (primary) hypertension: Secondary | ICD-10-CM | POA: Diagnosis not present

## 2020-04-26 DIAGNOSIS — I4891 Unspecified atrial fibrillation: Secondary | ICD-10-CM | POA: Diagnosis not present

## 2020-04-26 DIAGNOSIS — K589 Irritable bowel syndrome without diarrhea: Secondary | ICD-10-CM | POA: Diagnosis not present

## 2020-04-27 DIAGNOSIS — I499 Cardiac arrhythmia, unspecified: Secondary | ICD-10-CM | POA: Diagnosis not present

## 2020-04-27 DIAGNOSIS — I69853 Hemiplegia and hemiparesis following other cerebrovascular disease affecting right non-dominant side: Secondary | ICD-10-CM | POA: Diagnosis not present

## 2020-04-27 DIAGNOSIS — I4891 Unspecified atrial fibrillation: Secondary | ICD-10-CM | POA: Diagnosis not present

## 2020-04-27 DIAGNOSIS — R112 Nausea with vomiting, unspecified: Secondary | ICD-10-CM | POA: Diagnosis not present

## 2020-04-27 DIAGNOSIS — I10 Essential (primary) hypertension: Secondary | ICD-10-CM | POA: Diagnosis not present

## 2020-04-27 DIAGNOSIS — H409 Unspecified glaucoma: Secondary | ICD-10-CM | POA: Diagnosis not present

## 2020-04-27 DIAGNOSIS — K589 Irritable bowel syndrome without diarrhea: Secondary | ICD-10-CM | POA: Diagnosis not present

## 2020-04-27 DIAGNOSIS — I482 Chronic atrial fibrillation, unspecified: Secondary | ICD-10-CM | POA: Diagnosis not present

## 2020-04-27 DIAGNOSIS — Z66 Do not resuscitate: Secondary | ICD-10-CM | POA: Diagnosis not present

## 2020-04-27 DIAGNOSIS — J90 Pleural effusion, not elsewhere classified: Secondary | ICD-10-CM | POA: Diagnosis not present

## 2020-04-27 DIAGNOSIS — R0789 Other chest pain: Secondary | ICD-10-CM | POA: Diagnosis not present

## 2020-04-27 DIAGNOSIS — R0602 Shortness of breath: Secondary | ICD-10-CM | POA: Diagnosis not present

## 2020-04-27 DIAGNOSIS — N179 Acute kidney failure, unspecified: Secondary | ICD-10-CM | POA: Diagnosis not present

## 2020-04-27 DIAGNOSIS — K760 Fatty (change of) liver, not elsewhere classified: Secondary | ICD-10-CM | POA: Diagnosis not present

## 2020-04-27 DIAGNOSIS — Z0289 Encounter for other administrative examinations: Secondary | ICD-10-CM | POA: Diagnosis not present

## 2020-04-27 DIAGNOSIS — Z515 Encounter for palliative care: Secondary | ICD-10-CM | POA: Diagnosis not present

## 2020-04-27 DIAGNOSIS — R079 Chest pain, unspecified: Secondary | ICD-10-CM | POA: Diagnosis not present

## 2020-04-27 DIAGNOSIS — E119 Type 2 diabetes mellitus without complications: Secondary | ICD-10-CM | POA: Diagnosis not present

## 2020-04-27 DIAGNOSIS — G9341 Metabolic encephalopathy: Secondary | ICD-10-CM | POA: Diagnosis not present

## 2020-04-28 ENCOUNTER — Ambulatory Visit: Payer: PRIVATE HEALTH INSURANCE | Admitting: Physician Assistant

## 2020-04-28 DIAGNOSIS — R4701 Aphasia: Secondary | ICD-10-CM | POA: Diagnosis not present

## 2020-04-28 DIAGNOSIS — Z79899 Other long term (current) drug therapy: Secondary | ICD-10-CM | POA: Diagnosis not present

## 2020-04-28 DIAGNOSIS — N179 Acute kidney failure, unspecified: Secondary | ICD-10-CM | POA: Diagnosis not present

## 2020-04-28 DIAGNOSIS — I69320 Aphasia following cerebral infarction: Secondary | ICD-10-CM | POA: Diagnosis not present

## 2020-04-28 DIAGNOSIS — J918 Pleural effusion in other conditions classified elsewhere: Secondary | ICD-10-CM | POA: Diagnosis not present

## 2020-04-28 DIAGNOSIS — R7401 Elevation of levels of liver transaminase levels: Secondary | ICD-10-CM | POA: Diagnosis present

## 2020-04-28 DIAGNOSIS — E785 Hyperlipidemia, unspecified: Secondary | ICD-10-CM | POA: Diagnosis present

## 2020-04-28 DIAGNOSIS — R791 Abnormal coagulation profile: Secondary | ICD-10-CM | POA: Diagnosis present

## 2020-04-28 DIAGNOSIS — I10 Essential (primary) hypertension: Secondary | ICD-10-CM | POA: Diagnosis not present

## 2020-04-28 DIAGNOSIS — E860 Dehydration: Secondary | ICD-10-CM | POA: Diagnosis present

## 2020-04-28 DIAGNOSIS — E1142 Type 2 diabetes mellitus with diabetic polyneuropathy: Secondary | ICD-10-CM | POA: Diagnosis present

## 2020-04-28 DIAGNOSIS — I69853 Hemiplegia and hemiparesis following other cerebrovascular disease affecting right non-dominant side: Secondary | ICD-10-CM | POA: Diagnosis not present

## 2020-04-28 DIAGNOSIS — Z931 Gastrostomy status: Secondary | ICD-10-CM | POA: Diagnosis not present

## 2020-04-28 DIAGNOSIS — R34 Anuria and oliguria: Secondary | ICD-10-CM | POA: Diagnosis not present

## 2020-04-28 DIAGNOSIS — J90 Pleural effusion, not elsewhere classified: Secondary | ICD-10-CM | POA: Diagnosis present

## 2020-04-28 DIAGNOSIS — I4811 Longstanding persistent atrial fibrillation: Secondary | ICD-10-CM | POA: Diagnosis not present

## 2020-04-28 DIAGNOSIS — G40909 Epilepsy, unspecified, not intractable, without status epilepticus: Secondary | ICD-10-CM | POA: Diagnosis present

## 2020-04-28 DIAGNOSIS — E119 Type 2 diabetes mellitus without complications: Secondary | ICD-10-CM | POA: Diagnosis not present

## 2020-04-28 DIAGNOSIS — I6389 Other cerebral infarction: Secondary | ICD-10-CM | POA: Diagnosis not present

## 2020-04-28 DIAGNOSIS — K5909 Other constipation: Secondary | ICD-10-CM | POA: Diagnosis present

## 2020-04-28 DIAGNOSIS — I482 Chronic atrial fibrillation, unspecified: Secondary | ICD-10-CM | POA: Diagnosis not present

## 2020-04-28 DIAGNOSIS — Z950 Presence of cardiac pacemaker: Secondary | ICD-10-CM | POA: Diagnosis not present

## 2020-04-28 DIAGNOSIS — J439 Emphysema, unspecified: Secondary | ICD-10-CM | POA: Diagnosis not present

## 2020-04-28 DIAGNOSIS — Z4682 Encounter for fitting and adjustment of non-vascular catheter: Secondary | ICD-10-CM | POA: Diagnosis not present

## 2020-04-28 DIAGNOSIS — I4891 Unspecified atrial fibrillation: Secondary | ICD-10-CM | POA: Diagnosis not present

## 2020-04-28 DIAGNOSIS — K589 Irritable bowel syndrome without diarrhea: Secondary | ICD-10-CM | POA: Diagnosis not present

## 2020-04-28 DIAGNOSIS — K439 Ventral hernia without obstruction or gangrene: Secondary | ICD-10-CM | POA: Diagnosis not present

## 2020-04-28 DIAGNOSIS — E039 Hypothyroidism, unspecified: Secondary | ICD-10-CM | POA: Diagnosis present

## 2020-04-28 DIAGNOSIS — G9341 Metabolic encephalopathy: Secondary | ICD-10-CM | POA: Diagnosis not present

## 2020-04-28 DIAGNOSIS — I9589 Other hypotension: Secondary | ICD-10-CM | POA: Diagnosis not present

## 2020-04-28 DIAGNOSIS — G9349 Other encephalopathy: Secondary | ICD-10-CM | POA: Diagnosis not present

## 2020-04-28 DIAGNOSIS — N170 Acute kidney failure with tubular necrosis: Secondary | ICD-10-CM | POA: Diagnosis not present

## 2020-04-28 DIAGNOSIS — H409 Unspecified glaucoma: Secondary | ICD-10-CM | POA: Diagnosis not present

## 2020-04-28 DIAGNOSIS — L89152 Pressure ulcer of sacral region, stage 2: Secondary | ICD-10-CM | POA: Diagnosis present

## 2020-04-28 DIAGNOSIS — Z66 Do not resuscitate: Secondary | ICD-10-CM | POA: Diagnosis not present

## 2020-04-28 DIAGNOSIS — Z515 Encounter for palliative care: Secondary | ICD-10-CM | POA: Diagnosis not present

## 2020-04-28 DIAGNOSIS — D539 Nutritional anemia, unspecified: Secondary | ICD-10-CM | POA: Diagnosis present

## 2020-04-28 DIAGNOSIS — R0789 Other chest pain: Secondary | ICD-10-CM | POA: Diagnosis present

## 2020-04-28 DIAGNOSIS — Z7901 Long term (current) use of anticoagulants: Secondary | ICD-10-CM | POA: Diagnosis not present

## 2020-05-17 ENCOUNTER — Telehealth: Payer: Self-pay

## 2020-05-17 NOTE — Telephone Encounter (Signed)
The patient husband states she passed away on 05/17/2020. I gave him my condolences. I told him I will send a return kit to his address for the patient monitor. I did cancel all upcoming remotes, took her out of [place] and [employer].

## 2020-05-21 DEATH — deceased
